# Patient Record
Sex: Female | Born: 1960 | Race: Black or African American | Hispanic: No | Smoking: Never smoker
Health system: Southern US, Community
[De-identification: ages and names within clinical notes are randomized; demographics above are authoritative.]

## PROBLEM LIST (undated history)

## (undated) DIAGNOSIS — M791 Myalgia, unspecified site: Secondary | ICD-10-CM

## (undated) DIAGNOSIS — M47812 Spondylosis without myelopathy or radiculopathy, cervical region: Secondary | ICD-10-CM

## (undated) DIAGNOSIS — I10 Essential (primary) hypertension: Secondary | ICD-10-CM

## (undated) DIAGNOSIS — R202 Paresthesia of skin: Secondary | ICD-10-CM

## (undated) DIAGNOSIS — M51369 Other intervertebral disc degeneration, lumbar region without mention of lumbar back pain or lower extremity pain: Secondary | ICD-10-CM

## (undated) DIAGNOSIS — K429 Umbilical hernia without obstruction or gangrene: Secondary | ICD-10-CM

## (undated) DIAGNOSIS — K648 Other hemorrhoids: Secondary | ICD-10-CM

## (undated) DIAGNOSIS — D649 Anemia, unspecified: Secondary | ICD-10-CM

## (undated) DIAGNOSIS — R200 Anesthesia of skin: Secondary | ICD-10-CM

## (undated) DIAGNOSIS — D259 Leiomyoma of uterus, unspecified: Secondary | ICD-10-CM

## (undated) DIAGNOSIS — F419 Anxiety disorder, unspecified: Secondary | ICD-10-CM

## (undated) DIAGNOSIS — IMO0002 Reserved for concepts with insufficient information to code with codable children: Secondary | ICD-10-CM

## (undated) DIAGNOSIS — K317 Polyp of stomach and duodenum: Secondary | ICD-10-CM

## (undated) DIAGNOSIS — M5136 Other intervertebral disc degeneration, lumbar region: Secondary | ICD-10-CM

## (undated) DIAGNOSIS — R7303 Prediabetes: Secondary | ICD-10-CM

## (undated) DIAGNOSIS — Z90710 Acquired absence of both cervix and uterus: Secondary | ICD-10-CM

## (undated) DIAGNOSIS — Z9289 Personal history of other medical treatment: Secondary | ICD-10-CM

## (undated) DIAGNOSIS — E785 Hyperlipidemia, unspecified: Secondary | ICD-10-CM

## (undated) DIAGNOSIS — K219 Gastro-esophageal reflux disease without esophagitis: Secondary | ICD-10-CM

## (undated) HISTORY — PX: CERVICAL SPINE SURGERY: SHX589

## (undated) HISTORY — DX: Leiomyoma of uterus, unspecified: D25.9

## (undated) HISTORY — DX: Anxiety disorder, unspecified: F41.9

## (undated) HISTORY — DX: Umbilical hernia without obstruction or gangrene: K42.9

## (undated) HISTORY — DX: Personal history of other medical treatment: Z92.89

## (undated) HISTORY — PX: PELVIC LAPAROSCOPY: SHX162

## (undated) HISTORY — PX: HERNIA REPAIR: SHX51

## (undated) HISTORY — DX: Other intervertebral disc degeneration, lumbar region: M51.36

## (undated) HISTORY — DX: Spondylosis without myelopathy or radiculopathy, cervical region: M47.812

## (undated) HISTORY — DX: Gastro-esophageal reflux disease without esophagitis: K21.9

## (undated) HISTORY — DX: Myalgia, unspecified site: M79.10

## (undated) HISTORY — DX: Polyp of stomach and duodenum: K31.7

## (undated) HISTORY — DX: Acquired absence of both cervix and uterus: Z90.710

## (undated) HISTORY — DX: Reserved for concepts with insufficient information to code with codable children: IMO0002

## (undated) HISTORY — DX: Other hemorrhoids: K64.8

## (undated) HISTORY — DX: Other intervertebral disc degeneration, lumbar region without mention of lumbar back pain or lower extremity pain: M51.369

## (undated) HISTORY — DX: Anemia, unspecified: D64.9

## (undated) HISTORY — PX: ABDOMINAL HYSTERECTOMY: SHX81

## (undated) HISTORY — DX: Essential (primary) hypertension: I10

## (undated) HISTORY — DX: Paresthesia of skin: R20.0

## (undated) HISTORY — PX: EXPLORATORY LAPAROTOMY: SUR591

## (undated) HISTORY — DX: Anesthesia of skin: R20.2

## (undated) HISTORY — DX: Prediabetes: R73.03

---

## 1986-06-27 DIAGNOSIS — IMO0002 Reserved for concepts with insufficient information to code with codable children: Secondary | ICD-10-CM

## 1986-06-27 HISTORY — DX: Reserved for concepts with insufficient information to code with codable children: IMO0002

## 1986-06-27 HISTORY — PX: CERVIX LESION DESTRUCTION: SHX591

## 1996-06-27 HISTORY — PX: MYOMECTOMY: SHX85

## 1997-08-26 ENCOUNTER — Inpatient Hospital Stay (HOSPITAL_COMMUNITY): Admission: RE | Admit: 1997-08-26 | Discharge: 1997-08-30 | Payer: Self-pay | Admitting: Obstetrics and Gynecology

## 1997-10-31 ENCOUNTER — Ambulatory Visit (HOSPITAL_COMMUNITY): Admission: RE | Admit: 1997-10-31 | Discharge: 1997-10-31 | Payer: Self-pay | Admitting: Obstetrics and Gynecology

## 1999-04-08 ENCOUNTER — Other Ambulatory Visit: Admission: RE | Admit: 1999-04-08 | Discharge: 1999-04-08 | Payer: Self-pay | Admitting: Obstetrics and Gynecology

## 1999-04-11 ENCOUNTER — Emergency Department (HOSPITAL_COMMUNITY): Admission: EM | Admit: 1999-04-11 | Discharge: 1999-04-11 | Payer: Self-pay | Admitting: Emergency Medicine

## 1999-07-14 ENCOUNTER — Emergency Department (HOSPITAL_COMMUNITY): Admission: EM | Admit: 1999-07-14 | Discharge: 1999-07-15 | Payer: Self-pay

## 1999-07-15 ENCOUNTER — Emergency Department (HOSPITAL_COMMUNITY): Admission: EM | Admit: 1999-07-15 | Discharge: 1999-07-15 | Payer: Self-pay | Admitting: Emergency Medicine

## 1999-07-30 ENCOUNTER — Emergency Department (HOSPITAL_COMMUNITY): Admission: EM | Admit: 1999-07-30 | Discharge: 1999-07-31 | Payer: Self-pay | Admitting: Internal Medicine

## 2000-04-10 ENCOUNTER — Other Ambulatory Visit: Admission: RE | Admit: 2000-04-10 | Discharge: 2000-04-10 | Payer: Self-pay | Admitting: Obstetrics and Gynecology

## 2000-09-03 ENCOUNTER — Encounter: Payer: Self-pay | Admitting: Emergency Medicine

## 2000-09-03 ENCOUNTER — Emergency Department (HOSPITAL_COMMUNITY): Admission: EM | Admit: 2000-09-03 | Discharge: 2000-09-03 | Payer: Self-pay | Admitting: Emergency Medicine

## 2000-09-04 ENCOUNTER — Encounter: Payer: Self-pay | Admitting: Emergency Medicine

## 2001-02-16 ENCOUNTER — Other Ambulatory Visit: Admission: RE | Admit: 2001-02-16 | Discharge: 2001-02-16 | Payer: Self-pay | Admitting: Obstetrics and Gynecology

## 2001-06-27 HISTORY — PX: CHOLECYSTECTOMY: SHX55

## 2002-02-28 ENCOUNTER — Ambulatory Visit (HOSPITAL_COMMUNITY): Admission: RE | Admit: 2002-02-28 | Discharge: 2002-02-28 | Payer: Self-pay | Admitting: Obstetrics and Gynecology

## 2002-02-28 ENCOUNTER — Encounter: Payer: Self-pay | Admitting: Obstetrics and Gynecology

## 2002-03-08 ENCOUNTER — Other Ambulatory Visit: Admission: RE | Admit: 2002-03-08 | Discharge: 2002-03-08 | Payer: Self-pay | Admitting: Obstetrics and Gynecology

## 2002-05-08 ENCOUNTER — Encounter: Admission: RE | Admit: 2002-05-08 | Discharge: 2002-05-08 | Payer: Self-pay

## 2002-05-09 ENCOUNTER — Ambulatory Visit (HOSPITAL_COMMUNITY): Admission: RE | Admit: 2002-05-09 | Discharge: 2002-05-09 | Payer: Self-pay

## 2002-06-17 ENCOUNTER — Encounter: Payer: Self-pay | Admitting: Gastroenterology

## 2002-06-17 ENCOUNTER — Ambulatory Visit (HOSPITAL_COMMUNITY): Admission: RE | Admit: 2002-06-17 | Discharge: 2002-06-17 | Payer: Self-pay | Admitting: Gastroenterology

## 2002-07-10 ENCOUNTER — Emergency Department (HOSPITAL_COMMUNITY): Admission: EM | Admit: 2002-07-10 | Discharge: 2002-07-10 | Payer: Self-pay | Admitting: Emergency Medicine

## 2002-07-22 ENCOUNTER — Encounter: Payer: Self-pay | Admitting: Surgery

## 2002-07-22 ENCOUNTER — Encounter (INDEPENDENT_AMBULATORY_CARE_PROVIDER_SITE_OTHER): Payer: Self-pay | Admitting: Specialist

## 2002-07-22 ENCOUNTER — Ambulatory Visit (HOSPITAL_COMMUNITY): Admission: RE | Admit: 2002-07-22 | Discharge: 2002-07-23 | Payer: Self-pay | Admitting: Surgery

## 2002-08-21 ENCOUNTER — Emergency Department (HOSPITAL_COMMUNITY): Admission: EM | Admit: 2002-08-21 | Discharge: 2002-08-21 | Payer: Self-pay | Admitting: Emergency Medicine

## 2002-08-22 ENCOUNTER — Encounter: Admission: RE | Admit: 2002-08-22 | Discharge: 2002-08-22 | Payer: Self-pay

## 2002-09-30 ENCOUNTER — Emergency Department (HOSPITAL_COMMUNITY): Admission: EM | Admit: 2002-09-30 | Discharge: 2002-09-30 | Payer: Self-pay | Admitting: Emergency Medicine

## 2002-10-01 ENCOUNTER — Emergency Department (HOSPITAL_COMMUNITY): Admission: EM | Admit: 2002-10-01 | Discharge: 2002-10-01 | Payer: Self-pay

## 2002-10-05 ENCOUNTER — Ambulatory Visit (HOSPITAL_COMMUNITY): Admission: EM | Admit: 2002-10-05 | Discharge: 2002-10-05 | Payer: Self-pay | Admitting: Gastroenterology

## 2002-10-05 ENCOUNTER — Encounter: Payer: Self-pay | Admitting: Gastroenterology

## 2003-03-04 ENCOUNTER — Ambulatory Visit (HOSPITAL_COMMUNITY): Admission: RE | Admit: 2003-03-04 | Discharge: 2003-03-04 | Payer: Self-pay | Admitting: Obstetrics and Gynecology

## 2003-03-04 ENCOUNTER — Encounter: Payer: Self-pay | Admitting: Obstetrics and Gynecology

## 2003-03-10 ENCOUNTER — Other Ambulatory Visit: Admission: RE | Admit: 2003-03-10 | Discharge: 2003-03-10 | Payer: Self-pay | Admitting: Obstetrics and Gynecology

## 2003-03-26 ENCOUNTER — Ambulatory Visit (HOSPITAL_COMMUNITY): Admission: RE | Admit: 2003-03-26 | Discharge: 2003-03-26 | Payer: Self-pay | Admitting: Gastroenterology

## 2003-05-05 ENCOUNTER — Encounter: Admission: RE | Admit: 2003-05-05 | Discharge: 2003-05-05 | Payer: Self-pay | Admitting: Gastroenterology

## 2003-05-20 ENCOUNTER — Encounter: Admission: RE | Admit: 2003-05-20 | Discharge: 2003-05-20 | Payer: Self-pay | Admitting: Family Medicine

## 2003-11-21 ENCOUNTER — Encounter: Admission: RE | Admit: 2003-11-21 | Discharge: 2003-11-21 | Payer: Self-pay | Admitting: Family Medicine

## 2004-03-19 ENCOUNTER — Encounter: Admission: RE | Admit: 2004-03-19 | Discharge: 2004-03-19 | Payer: Self-pay | Admitting: Orthopedic Surgery

## 2004-03-22 ENCOUNTER — Ambulatory Visit (HOSPITAL_COMMUNITY): Admission: RE | Admit: 2004-03-22 | Discharge: 2004-03-22 | Payer: Self-pay | Admitting: Obstetrics and Gynecology

## 2004-05-11 ENCOUNTER — Other Ambulatory Visit: Admission: RE | Admit: 2004-05-11 | Discharge: 2004-05-11 | Payer: Self-pay | Admitting: Obstetrics and Gynecology

## 2004-08-11 ENCOUNTER — Ambulatory Visit (HOSPITAL_COMMUNITY): Admission: RE | Admit: 2004-08-11 | Discharge: 2004-08-11 | Payer: Self-pay | Admitting: Neurology

## 2004-08-18 ENCOUNTER — Emergency Department (HOSPITAL_COMMUNITY): Admission: EM | Admit: 2004-08-18 | Discharge: 2004-08-18 | Payer: Self-pay | Admitting: Emergency Medicine

## 2004-08-20 ENCOUNTER — Encounter: Admission: RE | Admit: 2004-08-20 | Discharge: 2004-08-20 | Payer: Self-pay | Admitting: Neurology

## 2004-10-14 ENCOUNTER — Other Ambulatory Visit: Admission: RE | Admit: 2004-10-14 | Discharge: 2004-10-14 | Payer: Self-pay | Admitting: Obstetrics and Gynecology

## 2005-03-23 ENCOUNTER — Emergency Department (HOSPITAL_COMMUNITY): Admission: EM | Admit: 2005-03-23 | Discharge: 2005-03-23 | Payer: Self-pay | Admitting: Emergency Medicine

## 2005-05-12 ENCOUNTER — Other Ambulatory Visit: Admission: RE | Admit: 2005-05-12 | Discharge: 2005-05-12 | Payer: Self-pay | Admitting: Obstetrics and Gynecology

## 2005-08-10 ENCOUNTER — Encounter: Admission: RE | Admit: 2005-08-10 | Discharge: 2005-08-10 | Payer: Self-pay | Admitting: Neurology

## 2005-08-17 ENCOUNTER — Encounter: Admission: RE | Admit: 2005-08-17 | Discharge: 2005-08-17 | Payer: Self-pay | Admitting: Family Medicine

## 2006-04-12 ENCOUNTER — Ambulatory Visit: Payer: Self-pay | Admitting: Hematology and Oncology

## 2006-05-03 LAB — CBC WITH DIFFERENTIAL/PLATELET
BASO%: 0.2 % (ref 0.0–2.0)
Basophils Absolute: 0 10*3/uL (ref 0.0–0.1)
EOS%: 1.5 % (ref 0.0–7.0)
Eosinophils Absolute: 0.1 10*3/uL (ref 0.0–0.5)
HCT: 35.7 % (ref 34.8–46.6)
HGB: 11.9 g/dL (ref 11.6–15.9)
LYMPH%: 26.4 % (ref 14.0–48.0)
MCH: 30.7 pg (ref 26.0–34.0)
MCHC: 33.4 g/dL (ref 32.0–36.0)
MCV: 91.8 fL (ref 81.0–101.0)
MONO#: 0.5 10*3/uL (ref 0.1–0.9)
MONO%: 7.8 % (ref 0.0–13.0)
NEUT#: 4.2 10*3/uL (ref 1.5–6.5)
NEUT%: 64.1 % (ref 39.6–76.8)
Platelets: 196 10*3/uL (ref 145–400)
RBC: 3.88 10*6/uL (ref 3.70–5.32)
RDW: 14.6 % — ABNORMAL HIGH (ref 11.3–14.5)
WBC: 6.5 10*3/uL (ref 3.9–10.0)
lymph#: 1.7 10*3/uL (ref 0.9–3.3)

## 2006-05-03 LAB — IVY BLEEDING TIME: Bleeding Time: 6 Minutes (ref 2.0–8.0)

## 2006-05-10 LAB — VON WILLEBRAND FACTOR MULTIMER
Factor-VIII Activity: 136 % (ref 75–150)
Ristocetin-Cofactor: >150 % — ABNORMAL HIGH (ref 50–150)
Von Willebrand Ag: 217 % normal — ABNORMAL HIGH (ref 61–164)
Von Willebrand Multimers: NORMAL

## 2006-05-10 LAB — COMPREHENSIVE METABOLIC PANEL
ALT: 13 U/L (ref 0–35)
AST: 17 U/L (ref 0–37)
Albumin: 4.1 g/dL (ref 3.5–5.2)
Alkaline Phosphatase: 41 U/L (ref 39–117)
BUN: 10 mg/dL (ref 6–23)
CO2: 26 mEq/L (ref 19–32)
Calcium: 8.8 mg/dL (ref 8.4–10.5)
Chloride: 106 mEq/L (ref 96–112)
Creatinine, Ser: 0.85 mg/dL (ref 0.40–1.20)
Glucose, Bld: 84 mg/dL (ref 70–99)
Potassium: 3.6 mEq/L (ref 3.5–5.3)
Sodium: 141 mEq/L (ref 135–145)
Total Bilirubin: 0.5 mg/dL (ref 0.3–1.2)
Total Protein: 6.8 g/dL (ref 6.0–8.3)

## 2006-05-10 LAB — LUPUS ANTICOAGULANT PANEL
DRVVT: 39.9 secs (ref 31.9–44.2)
PTT Lupus Anticoagulant: 50 secs — ABNORMAL HIGH (ref 36.3–48.8)
PTTLA 4:1 Mix: 45.4 secs (ref 36.3–48.8)

## 2006-05-10 LAB — FACTOR 11: Factor XI Activity: 101 % (ref 56–153)

## 2006-05-10 LAB — FIBRINOGEN: Fibrinogen: 338 mg/dL (ref 204–475)

## 2006-05-10 LAB — D-DIMER, QUANTITATIVE (NOT AT ARMC): D-Dimer, Quant: 0.27 ug/mL-FEU (ref 0.00–0.48)

## 2006-05-10 LAB — PROTHROMBIN TIME
INR: 1.1 (ref 0.0–1.5)
Prothrombin Time: 14.7 seconds (ref 11.6–15.2)

## 2006-05-10 LAB — PROTEIN S, TOTAL: Protein S Ag, Total: 123 % (ref 63–126)

## 2006-05-10 LAB — APTT: aPTT: 34 seconds (ref 24–37)

## 2006-05-10 LAB — PROTEIN S ACTIVITY: Protein S Activity: 72 % — ABNORMAL LOW (ref 81–180)

## 2006-05-17 ENCOUNTER — Ambulatory Visit (HOSPITAL_COMMUNITY): Admission: RE | Admit: 2006-05-17 | Discharge: 2006-05-17 | Payer: Self-pay | Admitting: Obstetrics and Gynecology

## 2006-06-13 ENCOUNTER — Other Ambulatory Visit: Admission: RE | Admit: 2006-06-13 | Discharge: 2006-06-13 | Payer: Self-pay | Admitting: Obstetrics & Gynecology

## 2006-09-11 ENCOUNTER — Ambulatory Visit: Payer: Self-pay | Admitting: Cardiology

## 2006-09-24 ENCOUNTER — Emergency Department (HOSPITAL_COMMUNITY): Admission: EM | Admit: 2006-09-24 | Discharge: 2006-09-24 | Payer: Self-pay | Admitting: Emergency Medicine

## 2006-12-31 ENCOUNTER — Emergency Department (HOSPITAL_COMMUNITY): Admission: EM | Admit: 2006-12-31 | Discharge: 2006-12-31 | Payer: Self-pay | Admitting: Emergency Medicine

## 2007-04-23 ENCOUNTER — Emergency Department (HOSPITAL_COMMUNITY): Admission: EM | Admit: 2007-04-23 | Discharge: 2007-04-23 | Payer: Self-pay | Admitting: Emergency Medicine

## 2007-05-21 ENCOUNTER — Ambulatory Visit (HOSPITAL_COMMUNITY): Admission: RE | Admit: 2007-05-21 | Discharge: 2007-05-21 | Payer: Self-pay | Admitting: Obstetrics and Gynecology

## 2007-06-15 ENCOUNTER — Other Ambulatory Visit: Admission: RE | Admit: 2007-06-15 | Discharge: 2007-06-15 | Payer: Self-pay | Admitting: Obstetrics & Gynecology

## 2007-08-25 LAB — HM PAP SMEAR: HM Pap smear: NORMAL

## 2007-09-01 ENCOUNTER — Emergency Department (HOSPITAL_COMMUNITY): Admission: EM | Admit: 2007-09-01 | Discharge: 2007-09-02 | Payer: Self-pay | Admitting: Emergency Medicine

## 2007-11-06 IMAGING — CR DG NECK SOFT TISSUE
1 series · 1 of 1 positions shown · non-contrast
Comparison: none

CLINICAL DATA: Foreign object in throat.  Plastic candy wrapper stuck in throat.  
 NECK SOFT TISSUE ? 1 VIEW:

[view not recorded]
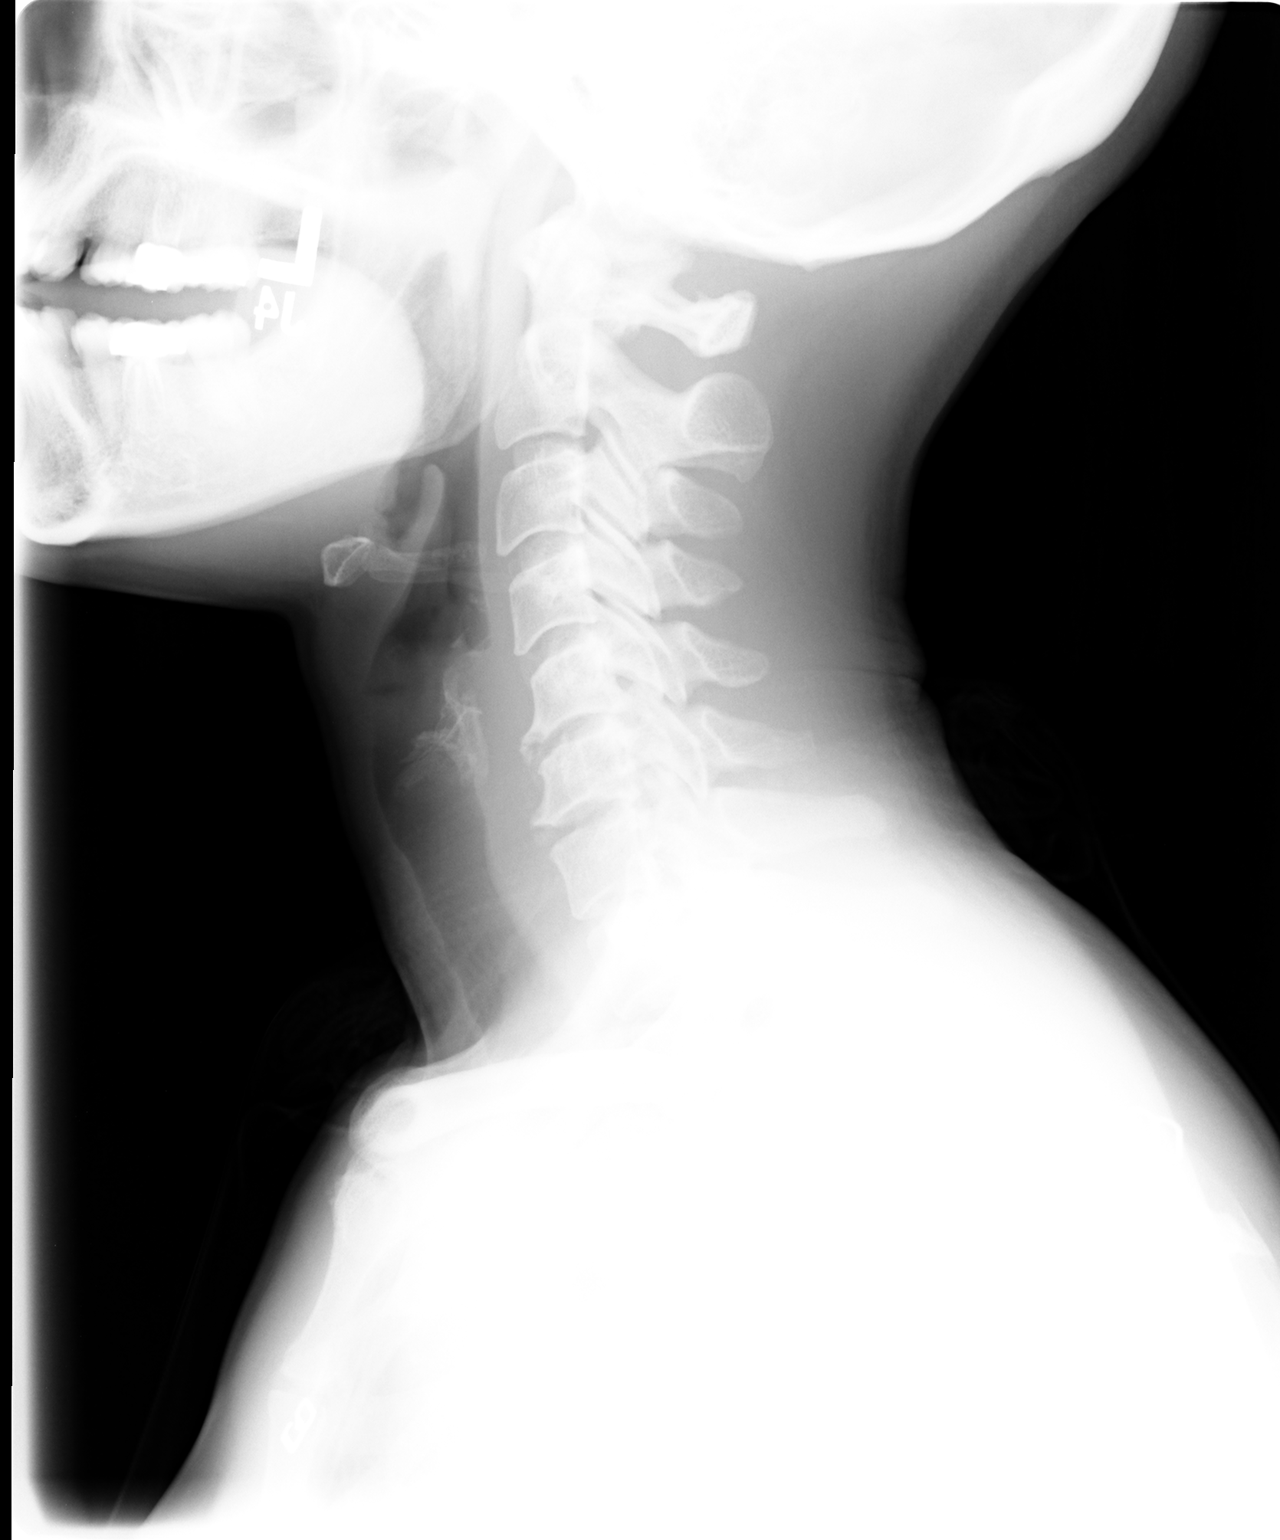

[1 of 1 positions shown; findings below may reference images not displayed]

FINDINGS: Prevertebral soft tissues are within normal limits.  No definite radiopaque foreign body.  Degenerative changes are seen in the spine.
IMPRESSION: 1.  No definite radiopaque foreign body.  
 2.  Spondylosis.

## 2007-11-15 ENCOUNTER — Emergency Department (HOSPITAL_COMMUNITY): Admission: EM | Admit: 2007-11-15 | Discharge: 2007-11-15 | Payer: Self-pay | Admitting: Family Medicine

## 2007-12-26 DIAGNOSIS — Z9289 Personal history of other medical treatment: Secondary | ICD-10-CM

## 2007-12-26 HISTORY — DX: Personal history of other medical treatment: Z92.89

## 2007-12-27 ENCOUNTER — Ambulatory Visit (HOSPITAL_COMMUNITY): Admission: RE | Admit: 2007-12-27 | Discharge: 2007-12-27 | Payer: Self-pay | Admitting: Family Medicine

## 2008-05-15 ENCOUNTER — Emergency Department (HOSPITAL_COMMUNITY): Admission: EM | Admit: 2008-05-15 | Discharge: 2008-05-15 | Payer: Self-pay | Admitting: Emergency Medicine

## 2008-06-12 ENCOUNTER — Ambulatory Visit (HOSPITAL_COMMUNITY): Admission: RE | Admit: 2008-06-12 | Discharge: 2008-06-12 | Payer: Self-pay | Admitting: Obstetrics and Gynecology

## 2008-06-19 ENCOUNTER — Encounter: Admission: RE | Admit: 2008-06-19 | Discharge: 2008-06-19 | Payer: Self-pay | Admitting: Family Medicine

## 2008-06-22 ENCOUNTER — Encounter: Admission: RE | Admit: 2008-06-22 | Discharge: 2008-06-22 | Payer: Self-pay | Admitting: Family Medicine

## 2008-06-27 HISTORY — PX: CERVICAL FUSION: SHX112

## 2008-08-11 ENCOUNTER — Other Ambulatory Visit: Admission: RE | Admit: 2008-08-11 | Discharge: 2008-08-11 | Payer: Self-pay | Admitting: Obstetrics and Gynecology

## 2008-09-14 ENCOUNTER — Emergency Department (HOSPITAL_COMMUNITY): Admission: EM | Admit: 2008-09-14 | Discharge: 2008-09-14 | Payer: Self-pay | Admitting: Emergency Medicine

## 2008-09-19 ENCOUNTER — Encounter: Admission: RE | Admit: 2008-09-19 | Discharge: 2008-09-19 | Payer: Self-pay | Admitting: Emergency Medicine

## 2008-11-24 ENCOUNTER — Emergency Department (HOSPITAL_COMMUNITY): Admission: EM | Admit: 2008-11-24 | Discharge: 2008-11-24 | Payer: Self-pay | Admitting: Family Medicine

## 2009-06-15 ENCOUNTER — Ambulatory Visit (HOSPITAL_COMMUNITY): Admission: RE | Admit: 2009-06-15 | Discharge: 2009-06-15 | Payer: Self-pay | Admitting: Obstetrics and Gynecology

## 2009-06-23 ENCOUNTER — Encounter: Admission: RE | Admit: 2009-06-23 | Discharge: 2009-06-23 | Payer: Self-pay | Admitting: Obstetrics and Gynecology

## 2009-09-17 ENCOUNTER — Encounter: Admission: RE | Admit: 2009-09-17 | Discharge: 2009-09-17 | Payer: Self-pay | Admitting: Orthopedic Surgery

## 2009-10-12 ENCOUNTER — Ambulatory Visit (HOSPITAL_COMMUNITY): Admission: RE | Admit: 2009-10-12 | Discharge: 2009-10-13 | Payer: Self-pay | Admitting: Neurosurgery

## 2010-03-27 DIAGNOSIS — I10 Essential (primary) hypertension: Secondary | ICD-10-CM

## 2010-03-27 DIAGNOSIS — Z9289 Personal history of other medical treatment: Secondary | ICD-10-CM

## 2010-03-27 HISTORY — DX: Essential (primary) hypertension: I10

## 2010-03-27 HISTORY — DX: Personal history of other medical treatment: Z92.89

## 2010-04-07 ENCOUNTER — Observation Stay (HOSPITAL_COMMUNITY): Admission: EM | Admit: 2010-04-07 | Discharge: 2010-04-07 | Payer: Self-pay | Admitting: Emergency Medicine

## 2010-04-07 ENCOUNTER — Ambulatory Visit: Payer: Self-pay | Admitting: Cardiology

## 2010-04-07 ENCOUNTER — Encounter (INDEPENDENT_AMBULATORY_CARE_PROVIDER_SITE_OTHER): Payer: Self-pay | Admitting: Internal Medicine

## 2010-06-02 ENCOUNTER — Ambulatory Visit: Payer: Self-pay | Admitting: Cardiovascular Disease

## 2010-06-16 ENCOUNTER — Ambulatory Visit (HOSPITAL_COMMUNITY): Admission: RE | Admit: 2010-06-16 | Payer: Self-pay | Source: Home / Self Care | Admitting: Obstetrics and Gynecology

## 2010-07-17 ENCOUNTER — Encounter: Payer: Self-pay | Admitting: Obstetrics and Gynecology

## 2010-07-18 ENCOUNTER — Encounter: Payer: Self-pay | Admitting: Obstetrics and Gynecology

## 2010-07-23 ENCOUNTER — Other Ambulatory Visit: Payer: Self-pay | Admitting: Obstetrics and Gynecology

## 2010-07-23 DIAGNOSIS — Z1239 Encounter for other screening for malignant neoplasm of breast: Secondary | ICD-10-CM

## 2010-07-28 ENCOUNTER — Ambulatory Visit (HOSPITAL_COMMUNITY): Payer: Self-pay

## 2010-08-02 ENCOUNTER — Ambulatory Visit (HOSPITAL_COMMUNITY)
Admission: RE | Admit: 2010-08-02 | Discharge: 2010-08-02 | Disposition: A | Discharge status: Home / Self Care | Payer: BC Managed Care – PPO | Source: Ambulatory Visit | Attending: Obstetrics and Gynecology | Admitting: Obstetrics and Gynecology

## 2010-08-02 DIAGNOSIS — Z1231 Encounter for screening mammogram for malignant neoplasm of breast: Secondary | ICD-10-CM | POA: Insufficient documentation

## 2010-08-02 DIAGNOSIS — Z1239 Encounter for other screening for malignant neoplasm of breast: Secondary | ICD-10-CM

## 2010-08-26 HISTORY — PX: ESOPHAGOGASTRODUODENOSCOPY: SHX1529

## 2010-08-26 HISTORY — PX: COLONOSCOPY: SHX174

## 2010-09-09 LAB — POCT CARDIAC MARKERS
CKMB, poc: <1 ng/mL — ABNORMAL LOW (ref 1.0–8.0)
Myoglobin, poc: 30.5 ng/mL (ref 12–200)
Troponin i, poc: <0.05 ng/mL (ref 0.00–0.09)

## 2010-09-09 LAB — CBC
HCT: 30.1 % — ABNORMAL LOW (ref 36.0–46.0)
HCT: 32.8 % — ABNORMAL LOW (ref 36.0–46.0)
Hemoglobin: 10.3 g/dL — ABNORMAL LOW (ref 12.0–15.0)
Hemoglobin: 11.1 g/dL — ABNORMAL LOW (ref 12.0–15.0)
MCH: 30.5 pg (ref 26.0–34.0)
MCH: 30.9 pg (ref 26.0–34.0)
MCHC: 33.8 g/dL (ref 30.0–36.0)
MCHC: 34.2 g/dL (ref 30.0–36.0)
MCV: 90.4 fL (ref 78.0–100.0)
MCV: 90.4 fL (ref 78.0–100.0)
Platelets: 158 10*3/uL (ref 150–400)
Platelets: 175 10*3/uL (ref 150–400)
RBC: 3.33 MIL/uL — ABNORMAL LOW (ref 3.87–5.11)
RBC: 3.63 MIL/uL — ABNORMAL LOW (ref 3.87–5.11)
RDW: 14.1 % (ref 11.5–15.5)
RDW: 14.8 % (ref 11.5–15.5)
WBC: 4.6 10*3/uL (ref 4.0–10.5)
WBC: 5.2 10*3/uL (ref 4.0–10.5)

## 2010-09-09 LAB — CARDIAC PANEL(CRET KIN+CKTOT+MB+TROPI)
CK, MB: 0.6 ng/mL (ref 0.3–4.0)
CK, MB: 0.6 ng/mL (ref 0.3–4.0)
CK, MB: 0.7 ng/mL (ref 0.3–4.0)
Relative Index: 0.4 (ref 0.0–2.5)
Relative Index: 0.6 (ref 0.0–2.5)
Relative Index: 0.7 (ref 0.0–2.5)
Total CK: 104 U/L (ref 7–177)
Total CK: 109 U/L (ref 7–177)
Total CK: 147 U/L (ref 7–177)
Troponin I: 0.02 ng/mL (ref 0.00–0.06)
Troponin I: 0.02 ng/mL (ref 0.00–0.06)
Troponin I: 0.03 ng/mL (ref 0.00–0.06)

## 2010-09-09 LAB — BASIC METABOLIC PANEL
BUN: 10 mg/dL (ref 6–23)
BUN: 11 mg/dL (ref 6–23)
CO2: 26 mEq/L (ref 19–32)
CO2: 26 mEq/L (ref 19–32)
Calcium: 8.7 mg/dL (ref 8.4–10.5)
Calcium: 9.3 mg/dL (ref 8.4–10.5)
Chloride: 106 mEq/L (ref 96–112)
Chloride: 108 mEq/L (ref 96–112)
Creatinine, Ser: 0.75 mg/dL (ref 0.4–1.2)
Creatinine, Ser: 0.87 mg/dL (ref 0.4–1.2)
GFR calc Af Amer: >60 mL/min (ref >60)
GFR calc Af Amer: >60 mL/min (ref >60)
GFR calc non Af Amer: >60 mL/min (ref >60)
GFR calc non Af Amer: >60 mL/min (ref >60)
Glucose, Bld: 102 mg/dL — ABNORMAL HIGH (ref 70–99)
Glucose, Bld: 104 mg/dL — ABNORMAL HIGH (ref 70–99)
Potassium: 3.2 mEq/L — ABNORMAL LOW (ref 3.5–5.1)
Potassium: 3.7 mEq/L (ref 3.5–5.1)
Sodium: 138 mEq/L (ref 135–145)
Sodium: 138 mEq/L (ref 135–145)

## 2010-09-09 LAB — DIFFERENTIAL
Basophils Absolute: 0 10*3/uL (ref 0.0–0.1)
Basophils Relative: 1 % (ref 0–1)
Eosinophils Absolute: 0.3 10*3/uL (ref 0.0–0.7)
Eosinophils Relative: 5 % (ref 0–5)
Lymphocytes Relative: 40 % (ref 12–46)
Lymphs Abs: 2.1 10*3/uL (ref 0.7–4.0)
Monocytes Absolute: 0.5 10*3/uL (ref 0.1–1.0)
Monocytes Relative: 10 % (ref 3–12)
Neutro Abs: 2.3 10*3/uL (ref 1.7–7.7)
Neutrophils Relative %: 44 % (ref 43–77)

## 2010-09-15 LAB — BASIC METABOLIC PANEL
BUN: 10 mg/dL (ref 6–23)
CO2: 25 mEq/L (ref 19–32)
Calcium: 9 mg/dL (ref 8.4–10.5)
Chloride: 106 mEq/L (ref 96–112)
Creatinine, Ser: 0.7 mg/dL (ref 0.4–1.2)
GFR calc Af Amer: >60 mL/min (ref >60)
GFR calc non Af Amer: >60 mL/min (ref >60)
Glucose, Bld: 94 mg/dL (ref 70–99)
Potassium: 4 mEq/L (ref 3.5–5.1)
Sodium: 137 mEq/L (ref 135–145)

## 2010-09-15 LAB — CBC
HCT: 37.1 % (ref 36.0–46.0)
Hemoglobin: 12.9 g/dL (ref 12.0–15.0)
MCHC: 34.7 g/dL (ref 30.0–36.0)
MCV: 93.4 fL (ref 78.0–100.0)
Platelets: 174 10*3/uL (ref 150–400)
RBC: 3.97 MIL/uL (ref 3.87–5.11)
RDW: 15 % (ref 11.5–15.5)
WBC: 5.1 10*3/uL (ref 4.0–10.5)

## 2010-09-15 LAB — URINALYSIS, ROUTINE W REFLEX MICROSCOPIC
Bilirubin Urine: NEGATIVE
Glucose, UA: NEGATIVE mg/dL
Hgb urine dipstick: NEGATIVE
Ketones, ur: NEGATIVE mg/dL
Nitrite: NEGATIVE
Protein, ur: NEGATIVE mg/dL
Specific Gravity, Urine: 1.027 (ref 1.005–1.030)
Urobilinogen, UA: 1 mg/dL (ref 0.0–1.0)
pH: 7 (ref 5.0–8.0)

## 2010-09-15 LAB — PROTIME-INR
INR: 1.08 (ref 0.00–1.49)
Prothrombin Time: 13.9 seconds (ref 11.6–15.2)

## 2010-09-15 LAB — HCG, SERUM, QUALITATIVE: Preg, Serum: NEGATIVE

## 2010-09-15 LAB — SURGICAL PCR SCREEN
MRSA, PCR: NEGATIVE
Staphylococcus aureus: NEGATIVE

## 2010-09-15 LAB — APTT: aPTT: 31 seconds (ref 24–37)

## 2010-10-05 LAB — POCT URINALYSIS DIP (DEVICE)
Bilirubin Urine: NEGATIVE
Glucose, UA: NEGATIVE mg/dL
Nitrite: NEGATIVE
Protein, ur: 30 mg/dL — AB
Specific Gravity, Urine: 1.02 (ref 1.005–1.030)
Urobilinogen, UA: 0.2 mg/dL (ref 0.0–1.0)
pH: 7 (ref 5.0–8.0)

## 2010-10-05 LAB — URINE CULTURE: Colony Count: >=100000

## 2010-10-05 LAB — POCT PREGNANCY, URINE: Preg Test, Ur: NEGATIVE

## 2010-10-22 ENCOUNTER — Institutional Professional Consult (permissible substitution) (INDEPENDENT_AMBULATORY_CARE_PROVIDER_SITE_OTHER): Payer: BC Managed Care – PPO | Admitting: *Deleted

## 2010-10-22 DIAGNOSIS — E785 Hyperlipidemia, unspecified: Secondary | ICD-10-CM

## 2010-10-22 DIAGNOSIS — R634 Abnormal weight loss: Secondary | ICD-10-CM

## 2010-10-22 DIAGNOSIS — I10 Essential (primary) hypertension: Secondary | ICD-10-CM

## 2010-10-22 DIAGNOSIS — R7301 Impaired fasting glucose: Secondary | ICD-10-CM

## 2010-10-26 ENCOUNTER — Other Ambulatory Visit: Payer: BC Managed Care – PPO

## 2010-11-01 ENCOUNTER — Other Ambulatory Visit: Payer: BC Managed Care – PPO

## 2010-11-02 ENCOUNTER — Telehealth: Payer: Self-pay | Admitting: *Deleted

## 2010-11-02 NOTE — Telephone Encounter (Addendum)
Message copied by Ellsworth Lennox on Tue Nov 02, 2010  2:51 PM ------      Message from: Baden, DAVID      Created: Tue Nov 02, 2010  2:05 PM       1) cholesterol is elevated.  She is taking Red Yeast Rice, but I'd recommend starting a cholesterol medication to lower chol and reduce risk of stroke/heart dz.  If agreeable, I'll send script.      2) her screen for diabetes is 6.0%, about the same as prior, no worse.      3) her marker for inflammation (sed rate) is elevated, presumably due to her gluten issue that was recently diagnosed.   Did she get the gluten diet info we mailed to her?      4) she is anemic, but it appears stable.  Otherwise, her labs are ok.      5) recheck in 53mo on weight loss, anemia, sooner prn or if losing >5 lb/wk.  Pt notified of lab results.  Pt will start a cholesterol medication. Pt did get gluten diet info we mailed to her and will call to schedule an appointment in one month.  CM, LPN

## 2010-11-03 ENCOUNTER — Telehealth: Payer: Self-pay | Admitting: *Deleted

## 2010-11-03 ENCOUNTER — Encounter: Payer: Self-pay | Admitting: Medical

## 2010-11-03 ENCOUNTER — Other Ambulatory Visit: Payer: Self-pay | Admitting: Medical

## 2010-11-03 MED ORDER — PRAVASTATIN SODIUM 40 MG PO TABS: 40.0000 mg | ORAL_TABLET | Freq: Every evening | ORAL | Status: DC

## 2010-11-03 NOTE — Telephone Encounter (Addendum)
Patient has agreed to start Cholesterol medication.  Will schedule an appointment to come back if pt loses more than 5 lbs.  CM, LPN     Called in Prevachol 40 mg one by mouth every evening #30 with 5 refills to [facility] [address]  CM, LPN

## 2010-11-03 NOTE — Telephone Encounter (Signed)
Error

## 2010-11-03 NOTE — Telephone Encounter (Signed)
pls call in pravastatin script as no pharmacy listed

## 2010-11-04 ENCOUNTER — Encounter: Payer: Self-pay | Admitting: Medical

## 2010-11-12 NOTE — Op Note (Signed)
NAMEBAYLA, MCGOVERN                            ACCOUNT NO.:  [id number]   MEDICAL RECORD NO.:  [id number]                    PATIENT TYPE:   LOCATION:                                       FACILITY:   PHYSICIAN:  Abigail Miyamoto, M.D.              DATE OF BIRTH:  1960-08-18   DATE OF PROCEDURE:  07/22/2002  DATE OF DISCHARGE:                                 OPERATIVE REPORT   PREOPERATIVE DIAGNOSIS:  Biliary dyskinesia.   POSTOPERATIVE DIAGNOSIS:  Biliary dyskinesia.   PROCEDURE:  Laparoscopic cholecystectomy with intraoperative cholangiogram.   SURGEON:  Dr. Abigail Miyamoto.   ASSISTANT:  Dr. Glenna Fellows.   ANESTHESIA:  General endotracheal anesthesia.   ESTIMATED BLOOD LOSS:  Minimal.   FINDINGS:  Patient found to have a normal cholangiogram.   PROCEDURE IN DETAIL:  The patient is brought to the operating room,  identified as Shari Lawrence.  She was placed on the operating room table and  general anesthesia was induced.  Her abdomen was then prepped and draped in  the usual sterile fashion.  Using a 15 blade, a small transverse incision  was made below the umbilicus.  The incision was carried down through the  fascia and opened with scalpel.  Hemostat was then used to pass through the  peritoneal cavity.  Next, 0 Vicryl purse string sutures was placed around  the fascial opening.  The Hasson port was placed through the opening and  insufflation of the abdomen was begun.  Next, an 11 mm port was placed in  the patient's epigastrium and two 5 mm ports were placed in the patient's  right flank under direct vision.  The gallbladder was then grasped on the  triangle above the liver bed.  The cystic duct was easily dissected out and  clipped once distally and partly transected with scissors.  An angio  catheter was inserted in the right upper quadrant under direct vision.  A  cholangiocatheter was then inserted through this and placed into the cystic  duct.  A  cholangiogram was then performed under direct fluoroscopy.  Good  flow of contrast was seen through the entire biliary system as well as the  duodenum without evidence of abnormality.  At this point, the  cholangiocatheter was removed under direct vision.  The cystic duct was then  clipped three times proximally and then transected with scissors.  The  cystic artery and branch were then identified and clipped proximally and  distally and transected with scissors.  The gallbladder was then easily  dissected free from the liver bed with electrocautery.  Hemostasis was  achieved in the liver bed with electrocautery.  The gallbladder was then  removed through the incision of the umbilicus.  0 Vicryl in the umbilicus  was tied in place, closing the fascial defect.  The abdomen was then  copiously irrigated with normal saline.  Again, hemostasis appeared  to be  achieved.  All ports were then removed under direction vision and the  abdomen was deflated.  All incisions were anesthetized with 0.25% Marcaine  and then  closed with 4-0 Monocryl subcuticular sutures.  Steri-Strips followed by  Tegaderm tape were then applied.  The patient tolerated the procedure well.  All sponge, needle and instrument counts were correct at the end of the  procedure.  The patient was then extubated in the operating room and taken  in stable condition to the recovery room.                                               Abigail Miyamoto, M.D.    DB/MEDQ  D:  07/22/2002  T:  07/22/2002  Job:  [id number]

## 2010-11-12 NOTE — Op Note (Signed)
NAMEKHAMARI, Shari                          ACCOUNT NO.:  [id number]   MEDICAL RECORD NO.:  [id number]                   PATIENT TYPE:  AMB   LOCATION:  ENDO                                 FACILITY:  [facility]   PHYSICIAN:  Anselmo Rod, M.D.               DATE OF BIRTH:  04/15/1961   DATE OF PROCEDURE:  03/26/2003  DATE OF DISCHARGE:                                 OPERATIVE REPORT   PROCEDURE:  Colonoscopy.   ENDOSCOPIST:  Charna Elizabeth, M.D.   INSTRUMENT USED:  Olympus video colonoscope (adjustable pediatric scope  changed to an adult scope).   INDICATIONS FOR PROCEDURE:  Rectal bleeding with a history of abdominal pain  in a 50 year old [place] American female with a family history of IBS, rule  out IBD.   PREPROCEDURE PREPARATION:  Informed consent was obtained from the patient.  The patient was fasted for eight hours prior to the procedure and prepped  with a bottle of magnesium citrate and a gallon of GoLYTELY the night prior  to the procedure.   PREPROCEDURE PHYSICAL:  Patient with stable vital signs.  Neck supple.  Chest clear to auscultation.  S1 and S2 regular.  Abdomen soft with normal  bowel sounds.   DESCRIPTION OF PROCEDURE:  The patient was placed in the left lateral  decubitus position, sedated with 100 mg of Demerol and 10 mg Versed in slow  incremental doses.  Once the patient was adequately sedated, maintained on  low flow oxygen, continuous cardiac monitoring, the Olympus video  colonoscope was advanced from the rectum to the mid transverse colon with  difficulty, the pediatric adjustable scope was used initially, this was then  withdrawn after the scope could no longer be advanced into the ascending  colon after changing the patient's position from the left lateral to the  supine to the right lateral position with gentle application of abdominal  pressure.  The adjustable pediatric scope was then withdrawn and the adult  scope used instead.  Small  internal hemorrhoids were seen on retroflexion.  The patient had a very tortuous colon and experienced some discomfort with  insufflation of air into the colon indicating visceral hypersensitivity most  consistent with IBS.  The terminal ileum appeared normal without lesions.  There was no evidence of IBD.   IMPRESSION:  1. Essentially normal colonoscopy to the terminal ileum except for small     internal hemorrhoids.  2. Very tortuous colon probably secondary to abdominal surgeries in the     past.    RECOMMENDATIONS:  1. Continue high fiber diet with liberal fluid intake.  2. Repeat CRC screening in the next ten years unless the patient develops     any abnormal symptoms in the interim.  3. Outpatient follow up in the next two weeks or earlier if need be.  Anselmo Rod, M.D.    JNM/MEDQ  D:  03/27/2003  T:  03/27/2003  Job:  [id number]   cc:   Christella Noa, M.D.  [address]  [place], [place] [address]  Fax: [phone]

## 2010-11-12 NOTE — Op Note (Signed)
[name], [name] NO.:  [id number]   MEDICAL RECORD NO.:  [id number]          PATIENT TYPE:  OUT   LOCATION:  MDC                          FACILITY:  [facility]   PHYSICIAN:  Genene Churn. Love, M.D.    DATE OF BIRTH:  25-Oct-1960   DATE OF PROCEDURE:  08/11/2004  DATE OF DISCHARGE:                                 OPERATIVE REPORT   CLINICAL INFORMATION:  This patient has a history of recurrent paresthesias.   PROCEDURE NOTE:  The patient was prepped and draped in the left lateral  decubitus position using Betadine and 1% Xylocaine.  Xanax 0.5 mg was given  prior to the procedure because of anxiety.  The opening pressure was 170  mmH20 and clear colorless CSF was obtained and sent for studies.  The  patient tolerated the procedure well.      JML/MEDQ  D:  08/11/2004  T:  08/11/2004  Job:  [id number]

## 2010-11-24 ENCOUNTER — Ambulatory Visit: Payer: BC Managed Care – PPO | Admitting: Medical

## 2010-11-26 DIAGNOSIS — D649 Anemia, unspecified: Secondary | ICD-10-CM

## 2010-11-26 HISTORY — DX: Anemia, unspecified: D64.9

## 2011-03-21 LAB — HEPATIC FUNCTION PANEL
ALT: 39 — ABNORMAL HIGH
AST: 50 — ABNORMAL HIGH
Albumin: 4
Alkaline Phosphatase: 64
Bilirubin, Direct: <0.1
Total Bilirubin: 0.9
Total Protein: 7.2

## 2011-03-21 LAB — CBC
HCT: 39
Hemoglobin: 13
MCHC: 33.4
MCV: 89.5
Platelets: 205
RBC: 4.35
RDW: 14.8
WBC: 7.5

## 2011-03-21 LAB — I-STAT 8, (EC8 V) (CONVERTED LAB)
Acid-base deficit: 2
BUN: 13
Bicarbonate: 22.8
Chloride: 108
Glucose, Bld: 114 — ABNORMAL HIGH
HCT: 44
Hemoglobin: 15
Operator id: 294511
Potassium: 3.4 — ABNORMAL LOW
Sodium: 140
TCO2: 24
pCO2, Ven: 37 — ABNORMAL LOW
pH, Ven: 7.398 — ABNORMAL HIGH

## 2011-03-21 LAB — DIFFERENTIAL
Basophils Absolute: 0
Basophils Relative: 0
Eosinophils Absolute: 0
Eosinophils Relative: 1
Lymphocytes Relative: 5 — ABNORMAL LOW
Lymphs Abs: 0.4 — ABNORMAL LOW
Monocytes Absolute: 0.3
Monocytes Relative: 4
Neutro Abs: 6.8
Neutrophils Relative %: 91 — ABNORMAL HIGH

## 2011-03-21 LAB — POCT I-STAT CREATININE
Creatinine, Ser: 1.1
Operator id: 294511

## 2011-03-21 LAB — URINALYSIS, ROUTINE W REFLEX MICROSCOPIC
Glucose, UA: NEGATIVE
Ketones, ur: 40 — AB
Leukocytes, UA: NEGATIVE
Nitrite: NEGATIVE
Protein, ur: NEGATIVE
Specific Gravity, Urine: 1.031 — ABNORMAL HIGH
Urobilinogen, UA: 0.2
pH: 6

## 2011-03-21 LAB — URINE MICROSCOPIC-ADD ON

## 2011-03-21 LAB — LIPASE, BLOOD: Lipase: 21

## 2011-03-21 LAB — POCT PREGNANCY, URINE
Operator id: 294511
Preg Test, Ur: NEGATIVE

## 2011-06-15 ENCOUNTER — Ambulatory Visit (INDEPENDENT_AMBULATORY_CARE_PROVIDER_SITE_OTHER): Payer: Self-pay | Admitting: General Surgery

## 2011-06-18 ENCOUNTER — Telehealth: Payer: Self-pay | Admitting: Oncology

## 2011-06-18 NOTE — Telephone Encounter (Signed)
lmonvm advising the pt of her new pt appt with dr ha and for her to call me back to confirm

## 2011-06-18 NOTE — Telephone Encounter (Signed)
Pt called to confirm her appts for jan with dr ha. Pt requested a new d/t for her appt.

## 2011-06-26 ENCOUNTER — Encounter: Payer: Self-pay | Admitting: Oncology

## 2011-06-26 DIAGNOSIS — F411 Generalized anxiety disorder: Secondary | ICD-10-CM | POA: Insufficient documentation

## 2011-06-26 DIAGNOSIS — I10 Essential (primary) hypertension: Secondary | ICD-10-CM | POA: Insufficient documentation

## 2011-06-26 DIAGNOSIS — D509 Iron deficiency anemia, unspecified: Secondary | ICD-10-CM | POA: Insufficient documentation

## 2011-06-26 DIAGNOSIS — F419 Anxiety disorder, unspecified: Secondary | ICD-10-CM | POA: Insufficient documentation

## 2011-07-01 ENCOUNTER — Other Ambulatory Visit: Payer: BC Managed Care – PPO | Admitting: Lab

## 2011-07-01 ENCOUNTER — Ambulatory Visit: Payer: BC Managed Care – PPO | Admitting: Oncology

## 2011-07-06 ENCOUNTER — Encounter: Payer: Self-pay | Admitting: Oncology

## 2011-07-07 ENCOUNTER — Encounter: Payer: Self-pay | Admitting: Oncology

## 2011-07-07 ENCOUNTER — Telehealth: Payer: Self-pay | Admitting: Oncology

## 2011-07-07 ENCOUNTER — Ambulatory Visit: Payer: BC Managed Care – PPO

## 2011-07-07 ENCOUNTER — Ambulatory Visit (HOSPITAL_BASED_OUTPATIENT_CLINIC_OR_DEPARTMENT_OTHER): Payer: BC Managed Care – PPO | Admitting: Oncology

## 2011-07-07 ENCOUNTER — Other Ambulatory Visit: Payer: BC Managed Care – PPO | Admitting: Lab

## 2011-07-07 DIAGNOSIS — D649 Anemia, unspecified: Secondary | ICD-10-CM

## 2011-07-07 DIAGNOSIS — D509 Iron deficiency anemia, unspecified: Secondary | ICD-10-CM

## 2011-07-07 DIAGNOSIS — F419 Anxiety disorder, unspecified: Secondary | ICD-10-CM

## 2011-07-07 DIAGNOSIS — F411 Generalized anxiety disorder: Secondary | ICD-10-CM

## 2011-07-07 DIAGNOSIS — I10 Essential (primary) hypertension: Secondary | ICD-10-CM

## 2011-07-07 DIAGNOSIS — K219 Gastro-esophageal reflux disease without esophagitis: Secondary | ICD-10-CM

## 2011-07-07 DIAGNOSIS — D684 Acquired coagulation factor deficiency: Secondary | ICD-10-CM

## 2011-07-07 DIAGNOSIS — N920 Excessive and frequent menstruation with regular cycle: Secondary | ICD-10-CM

## 2011-07-07 LAB — CBC & DIFF AND RETIC
BASO%: 1.1 % (ref 0.0–2.0)
Basophils Absolute: 0.1 10*3/uL (ref 0.0–0.1)
EOS%: 8.3 % — ABNORMAL HIGH (ref 0.0–7.0)
Eosinophils Absolute: 0.4 10*3/uL (ref 0.0–0.5)
HCT: 34.1 % — ABNORMAL LOW (ref 34.8–46.6)
HGB: 11.1 g/dL — ABNORMAL LOW (ref 11.6–15.9)
Immature Retic Fract: 9.7 % (ref 1.60–10.00)
LYMPH%: 34 % (ref 14.0–49.7)
MCH: 27.3 pg (ref 25.1–34.0)
MCHC: 32.6 g/dL (ref 31.5–36.0)
MCV: 83.8 fL (ref 79.5–101.0)
MONO#: 0.3 10*3/uL (ref 0.1–0.9)
MONO%: 6.8 % (ref 0.0–14.0)
NEUT#: 2.3 10*3/uL (ref 1.5–6.5)
NEUT%: 49.8 % (ref 38.4–76.8)
Platelets: 204 10*3/uL (ref 145–400)
RBC: 4.07 10*6/uL (ref 3.70–5.45)
RDW: 22.2 % — ABNORMAL HIGH (ref 11.2–14.5)
Retic %: 1.19 % (ref 0.70–2.10)
Retic Ct Abs: 48.43 10*3/uL (ref 33.70–90.70)
WBC: 4.7 10*3/uL (ref 3.9–10.3)
lymph#: 1.6 10*3/uL (ref 0.9–3.3)
nRBC: 0 % (ref 0–0)

## 2011-07-07 LAB — MORPHOLOGY: PLT EST: ADEQUATE

## 2011-07-07 LAB — CHCC SMEAR

## 2011-07-07 LAB — COMPREHENSIVE METABOLIC PANEL
ALT: 12 U/L (ref 0–35)
AST: 17 U/L (ref 0–37)
Albumin: 3.9 g/dL (ref 3.5–5.2)
Alkaline Phosphatase: 63 U/L (ref 39–117)
BUN: 12 mg/dL (ref 6–23)
CO2: 27 mEq/L (ref 19–32)
Calcium: 10 mg/dL (ref 8.4–10.5)
Chloride: 104 mEq/L (ref 96–112)
Creatinine, Ser: 0.94 mg/dL (ref 0.50–1.10)
Glucose, Bld: 91 mg/dL (ref 70–99)
Potassium: 3.8 mEq/L (ref 3.5–5.3)
Sodium: 139 mEq/L (ref 135–145)
Total Bilirubin: 0.1 mg/dL — ABNORMAL LOW (ref 0.3–1.2)
Total Protein: 7.7 g/dL (ref 6.0–8.3)

## 2011-07-07 LAB — LACTATE DEHYDROGENASE: LDH: 179 U/L (ref 94–250)

## 2011-07-07 NOTE — Telephone Encounter (Signed)
APPTS MADE FOR April,JULY AND October,CARD TO PT     [facility]

## 2011-07-07 NOTE — Progress Notes (Signed)
[facility] INITIAL HEMATOLOGY CONSULTATION  Referral MD:  Wilmon Arms, M.D.  Reason for Referral:  Iron-deficiency anemia.   HPI: Ms. Ralls is a 51 yo African American with history of uterine fibroid, menometrorrhagia.  She was on oral contraceptive in the past; however, she decided to stop it due to feeling hormonal imbalanced as she has been approaching menopause.  Her oldest CBC from 05/03/2006 showed WBC 6.5; Hgb 11.9; Plt 196. She started feeling more fatigue last year with intermittent hematochezia, constipation.  A oldest CBC from her PCP office showed WBC 3.7 ;Hgb 9.0; Plt 214 on 04/07/2011.  She was referred to Dr. Loreta Ave and had colonoscopy which was negative on 09/24/2010.  She had EGD which showed gastric polyp.  A random duodenal proximal biopsy showed mild duodenal intraepithelial lymphocytosis.  She was advised to avoid gluten which has improved her symptoms.  She has been taking oral iron and for the last month has increased it to TID from BID.    She was kindly referred to the [facility] for evaluation.  She reports that with increased oral iron, her symptoms of fatigue has slightly improved.  She still has menometrorrhagia.  She changes up to 5 tampons/pads daily during her menstrual cycle.  She no longer has hematochezia.  She denies hematemasis, hemoptysis, melena, hematuria.  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  She denies headache, confusion, visual changes, SOB, dizziness, skin rash, back pain, paresthesia, depression.     Past Medical History  Diagnosis Date  . HTN (hypertension)   . Anxiety   . Anemia 11/2010  . GERD (gastroesophageal reflux disease)   . Migraine   . Fibromyalgia   . Uterine fibroid   . Internal hemorrhoids   . Gastric polyp   :    Past Surgical History  Procedure Date  . Cholecystectomy 2003  . Myomectomy 1998  . Cesarean section   . Exploratory laparotomy   . Cervical  fusion 2010  :   CURRENT MEDS: Current Outpatient Prescriptions  Medication Sig Dispense Refill  . amLODipine (NORVASC) 5 MG tablet Take 5 mg by mouth daily.        . calcium carbonate (OS-CAL) 600 MG TABS Take 600 mg by mouth daily.        . Cholecalciferol ([facility] VIT D 5000 HIGH-POTENCY) 5000 UNITS capsule Take 2,000 Units by mouth daily.       Marland Kitchen dexlansoprazole (DEXILANT) 60 MG capsule Take 60 mg by mouth daily.        . ferrous sulfate 325 (65 FE) MG tablet Take 325 mg by mouth 3 (three) times daily with meals.      . magnesium 30 MG tablet Take 30 mg by mouth daily.        . Multiple Vitamin (MULTIVITAMIN) tablet Take 1 tablet by mouth daily.        . Probiotic Product (PROBIOTIC ACIDOPHILUS PO) Take 1 tablet by mouth daily.        . vitamin E 100 UNIT capsule Take 100 Units by mouth daily.        . Ascorbic Acid (VITAMIN C) 1000 MG tablet Take 1,000 mg by mouth daily.            Allergies  Allergen Reactions  . Gluten Meal   . Percocet (Oxycodone-Acetaminophen)   . Quinolones   . Sulfa Drugs Cross Reactors   . Vicodin (Hydrocodone-Acetaminophen)   :  Family  History  Problem Relation Age of Onset  . Cancer Mother     colon CA  . Hypertension Mother   . Dementia Mother   . Deep vein thrombosis Father   . Hypertension Father   . Cancer Father     prostate CA  . Hypertension Sister   . Hyperlipidemia Sister   . Hypertension Brother   . Hyperlipidemia Brother   . Hypertension Maternal Aunt   . Hyperlipidemia Maternal Aunt   . Rheum arthritis Maternal Aunt   :  History   Social History  . Marital Status: Legally Separated    Spouse Name: N/A    Number of Children: 1  . Years of Education: N/A   Occupational History  .      molecular Production manager, PhD candidate, [employer]   Social History Main Topics  . Smoking status: Never Smoker   . Smokeless tobacco: Never Used  . Alcohol Use: No  . Drug Use: No  . Sexually Active: No   Other Topics Concern   . Not on file   Social History Narrative  . No narrative on file    REVIEW OF SYSTEM:  The rest of the 14-point review of sytem was negative.   Exam:   General:  well-nourished in no acute distress.  Eyes:  no scleral icterus.  ENT:  There were no oropharyngeal lesions.  Neck was without thyromegaly.  Lymphatics:  Negative cervical, supraclavicular or axillary adenopathy.  Respiratory: lungs were clear bilaterally without wheezing or crackles.  Cardiovascular:  Regular rate and rhythm, S1/S2, without murmur, rub or gallop.  There was no pedal edema.  GI:  abdomen was soft, flat, nontender, nondistended, without organomegaly.  Muscoloskeletal:  no spinal tenderness of palpation of vertebral spine.  Skin exam was without echymosis, petichae.  Neuro exam was nonfocal.  Patient was able to get on and off exam table without assistance.  Gait was normal.  Patient was alerted and oriented.  Attention was good.   Language was appropriate.  Mood was normal without depression.  Speech was not pressured.  Thought content was not tangential.    LABS:  Lab Results  Component Value Date   WBC 4.7 07/07/2011   HGB 11.1* 07/07/2011   HCT 34.1* 07/07/2011   PLT 204 07/07/2011   GLUCOSE 91 07/07/2011   ALT 12 07/07/2011   AST 17 07/07/2011   NA 139 07/07/2011   K 3.8 07/07/2011   CL 104 07/07/2011   CREATININE 0.94 07/07/2011   BUN 12 07/07/2011   CO2 27 07/07/2011   INR 1.08 10/08/2009     Blood smear review:   I personally reviewed the patient's peripheral blood smear today.  There was anisocytosis and microcytosis.  There was no peripheral blast.  There was no schistocytosis, spherocytosis, target cell, rouleaux formation, tear drop cell.  There was no giant platelets or platelet clumps.      ASSESSMENT AND PLAN:  1.  Iron-deficiency anemia:  Most likely from menometrorrhagia vs malabsorption.  She has been on TID of oral iron with improvement of her iron store and Hgb and also her symptoms.  I  advised her to continue with this regimen.  There is no indication for pRBC transfusion or IV iron.  I will check her CBC in 3 and 6 months, and I will see her in about 9 months.  In the future, if she has worsening iron deficiency anemia, I may consider IV iron.  If despite iron repletion,  and she still has anemia, I may consider further work up.    2.  Memometrorrhagia from uterine fibroid:  She does not want to be on oral contraceptives.  She does not want to undergo hyterectomy or any invasive procedure.  I advised her that over the next few years, when she is post menopause, this issue should revolve.   3.  Gluten allergy:  She is attempting to adhere to a gluten-free diet.   4.  Hypertension: well controlled on Amlodipine per PCP.   5.  GERD:  Well controlled on Dexlansoprazole and probiotic.   6.  Anxiety, Fibromyalgia.   The length of time of the face-to-face encounter was 30 minutes. More than 50% of time was spent counseling and coordination of care.

## 2011-07-11 LAB — PROTEIN ELECTROPHORESIS, SERUM
Albumin ELP: 60.9 % (ref 55.8–66.1)
Alpha-1-Globulin: 3.9 % (ref 2.9–4.9)
Alpha-2-Globulin: 10 % (ref 7.1–11.8)
Beta 2: 3.5 % (ref 3.2–6.5)
Beta Globulin: 5.4 % (ref 4.7–7.2)
Gamma Globulin: 16.3 % (ref 11.1–18.8)
Total Protein, Serum Electrophoresis: 6.9 g/dL (ref 6.0–8.3)

## 2011-07-11 LAB — SEDIMENTATION RATE: Sed Rate: 10 mm/hr (ref 0–22)

## 2011-07-11 LAB — IRON AND TIBC
%SAT: 10 % — ABNORMAL LOW (ref 20–55)
Iron: 36 ug/dL — ABNORMAL LOW (ref 42–145)
TIBC: 364 ug/dL (ref 250–470)
UIBC: 328 ug/dL (ref 125–400)

## 2011-07-11 LAB — FERRITIN: Ferritin: 13 ng/mL (ref 10–291)

## 2011-07-11 LAB — ERYTHROPOIETIN: Erythropoietin: 15.9 m[IU]/mL (ref 2.6–34.0)

## 2011-08-09 ENCOUNTER — Encounter: Payer: Self-pay | Admitting: Medical

## 2011-08-09 ENCOUNTER — Ambulatory Visit (INDEPENDENT_AMBULATORY_CARE_PROVIDER_SITE_OTHER): Payer: BC Managed Care – PPO | Admitting: Medical

## 2011-08-09 VITALS — BP 110/70 | HR 76 | Temp 97.8°F | Resp 18 | Wt 125.0 lb

## 2011-08-09 DIAGNOSIS — J329 Chronic sinusitis, unspecified: Secondary | ICD-10-CM

## 2011-08-09 MED ORDER — AMOXICILLIN 875 MG PO TABS: 875.0000 mg | ORAL_TABLET | Freq: Two times a day (BID) | ORAL | Status: AC

## 2011-08-09 NOTE — Progress Notes (Signed)
Subjective:  Shari Lawrence is a 51 y.o. female who presents for possible sinus infection.   She report 2 wk hx/o headache above the eyes, scratchy throat, runny nose, drainage, nasal congestion, body aches, and not feeling well in general. Some cough.  However,r in the last 3 days, sinus pressure, teeth pain, decreased sense of smell, ear pain, worse on right, yellow mucous discharge from nose.  She has +sick contacts.  Using Zycam, Claritin, Mucinex.  No other aggravating or relieving factors.  No other c/o.  Past Medical History  Diagnosis Date  . HTN (hypertension)   . Anxiety   . Anemia 11/2010  . GERD (gastroesophageal reflux disease)   . Migraine   . Fibromyalgia   . Uterine fibroid   . Internal hemorrhoids   . Gastric polyp    ROS: Gen: subj fever Lungs: no sob, wheezing GI: no pain, NVD   Objective:   Filed Vitals:   08/09/11 1524  BP: 110/70  Pulse: 76  Temp: 97.8 F (36.6 C)  Resp: 18    General appearance: Alert, WD/WN, no distress                             Skin: warm, no rash                           Head: + moderate frontal and maxillary sinus tenderness,                            Eyes: conjunctiva pale, corneas clear, PERRLA                            Ears: right TM somewhat retracted, left TM normal, external ear canals normal                          Nose: septum midline, turbinates swollen, with erythema and clear discharge             Mouth/throat: MMM, tongue normal, mild pharyngeal erythema                           Neck: supple, shoddy adenopathy, no thyromegaly, nontender                          Heart: RRR, normal S1, S2, no murmurs                         Lungs: CTA bilaterally, no wheezes, rales, or rhonchi      Assessment and Plan:   Encounter Diagnosis  Name Primary?  . Sinusitis Yes   Prescription given for Amoxicillin.  Can use OTC Mucinex for congestion.  Tylenol or Ibuprofen OTC for fever and malaise.  Discussed symptomatic relief,  nasal saline, and call or return if worse or not improving in 2-3 days.

## 2011-08-09 NOTE — Patient Instructions (Signed)

## 2011-08-16 ENCOUNTER — Other Ambulatory Visit: Payer: Self-pay | Admitting: Obstetrics and Gynecology

## 2011-08-16 DIAGNOSIS — Z1231 Encounter for screening mammogram for malignant neoplasm of breast: Secondary | ICD-10-CM

## 2011-08-30 ENCOUNTER — Telehealth: Payer: Self-pay | Admitting: Oncology

## 2011-08-30 NOTE — Telephone Encounter (Signed)
pt called and r/s lab appt for 04/10 to 04/12

## 2011-09-09 ENCOUNTER — Ambulatory Visit (HOSPITAL_COMMUNITY)
Admission: RE | Admit: 2011-09-09 | Discharge: 2011-09-09 | Disposition: A | Discharge status: Home / Self Care | Payer: BC Managed Care – PPO | Source: Ambulatory Visit | Attending: Obstetrics and Gynecology | Admitting: Obstetrics and Gynecology

## 2011-09-09 DIAGNOSIS — Z1231 Encounter for screening mammogram for malignant neoplasm of breast: Secondary | ICD-10-CM | POA: Insufficient documentation

## 2011-09-14 ENCOUNTER — Encounter: Payer: Self-pay | Admitting: Medical

## 2011-09-14 ENCOUNTER — Ambulatory Visit (INDEPENDENT_AMBULATORY_CARE_PROVIDER_SITE_OTHER): Payer: BC Managed Care – PPO | Admitting: Medical

## 2011-09-14 VITALS — BP 120/70 | HR 78 | Temp 98.2°F | Resp 16 | Wt 127.0 lb

## 2011-09-14 DIAGNOSIS — R7301 Impaired fasting glucose: Secondary | ICD-10-CM

## 2011-09-14 LAB — POCT URINALYSIS DIPSTICK
Bilirubin, UA: NEGATIVE
Blood, UA: NEGATIVE
Glucose, UA: NEGATIVE
Ketones, UA: NEGATIVE
Leukocytes, UA: NEGATIVE
Nitrite, UA: NEGATIVE
Protein, UA: NEGATIVE
Spec Grav, UA: 1.015
Urobilinogen, UA: NEGATIVE
pH, UA: 6

## 2011-09-14 LAB — POCT GLYCOSYLATED HEMOGLOBIN (HGB A1C): Hemoglobin A1C: 5.5

## 2011-09-14 NOTE — Patient Instructions (Addendum)
Begin checking your glucose as indicated below.  Please write down the numbers so I can them.  Use a chart.  I want you to check your glucose every morning for the next 1-2 weeks.   At random times, check a before meal, and check 2 hours after meal readings.  For example, tonight check your glucose prior to dinner, then 2 hours later, check it again.   Again, write these numbers down in a chart form based on date, time, before meal, after meal, etc.   General guidelines: Fasting glucose over 124 - is bad Glucose over 200, 2 hours after a meal is bad Any time reading at random over 200 is typically not good

## 2011-09-14 NOTE — Progress Notes (Signed)
Subjective:   HPI  Shari Lawrence is a 51 y.o. female who presents for concern for diabetes.  She went to Dr. Lavonia Drafts back in December 2012, had checkup on iron deficiency anemia, ended up having EKG and colonoscopy last year, had a polyp in her stomach.  Labs were done at Dr. Kenna Gilbert.   When she went to her gynecologist today who looked over the labs, there was a concern for diabetes.  She has large fibroids, and her gynecologist is planning surgery soon, but wanted the diabetes issue figured out first.  Thus, she is here today.  She does note prior elevated HgbA1Cs.  Her anemia has finally been improving after getting on new iron formulation.  She apparently wasn't tolerating prior iron therapies.   She notes some increased urination, blurry vision, some dry mouth, but no weigh changes, no polydipsia, no dizziness. Her last eye doctor visit was 2 years ago.  She notes 1st cousin has diabetes type 1.  Paternal aunt with type II, maternal aunt and uncle type II, but no first degree diabetes in family.  No other c/o.  The following portions of the patient's history were reviewed and updated as appropriate: allergies, current medications, past family history, past medical history, past social history, past surgical history and problem list.  Past Medical History  Diagnosis Date  . HTN (hypertension)   . Anxiety   . Anemia 11/2010  . GERD (gastroesophageal reflux disease)   . Migraine   . Fibromyalgia   . Uterine fibroid   . Internal hemorrhoids   . Gastric polyp     Allergies  Allergen Reactions  . Gluten Meal   . Latex   . Percocet (Oxycodone-Acetaminophen)   . Quinolones   . Sulfa Drugs Cross Reactors   . Vicodin (Hydrocodone-Acetaminophen)      Review of Systems ROS reviewed and was negative other than noted in HPI or above.    Objective:   Physical Exam  General appearance: alert, no distress, WD/WN, lean tall black female Oral cavity: MMM, no lesions Neck: supple, no  lymphadenopathy, no thyromegaly, no masses Heart: RRR, normal S1, S2, no murmurs Lungs: CTA bilaterally, no wheezes, rhonchi, or rales Abdomen: +bs, soft, non tender, non distended, palpable uterine fibroids, no hepatomegaly, no splenomegaly Pulses: 2+ symmetric   Assessment and Plan :     Encounter Diagnosis  Name Primary?  . Impaired fasting blood sugar Yes   I reviewed the notes and labs from Dr. Loreta Ave showing HgbA1C of 6.2% on 06/07/11.  Urinalysis today normal, HgbA1C today 5.5%.  We demonstrated glucometer testing today.  She will use the glucometer and check fasting readings as well as pre and 2 hour post prandial readings and get these to me in 2 weeks.  Pending results, we will decide next steps.   Of note, she is exercising some, is eating healthy and gluten free diet for the most part.

## 2011-09-19 ENCOUNTER — Encounter (HOSPITAL_COMMUNITY): Payer: Self-pay | Admitting: Emergency Medicine

## 2011-09-19 ENCOUNTER — Emergency Department (INDEPENDENT_AMBULATORY_CARE_PROVIDER_SITE_OTHER)
Admission: EM | Admit: 2011-09-19 | Discharge: 2011-09-19 | Disposition: A | Discharge status: Home / Self Care | Payer: BC Managed Care – PPO | Source: Home / Self Care | Attending: Family Medicine | Admitting: Family Medicine

## 2011-09-19 DIAGNOSIS — Z041 Encounter for examination and observation following transport accident: Secondary | ICD-10-CM

## 2011-09-19 DIAGNOSIS — M542 Cervicalgia: Secondary | ICD-10-CM

## 2011-09-19 HISTORY — DX: Hyperlipidemia, unspecified: E78.5

## 2011-09-19 MED ORDER — CYCLOBENZAPRINE HCL 5 MG PO TABS: 5.0000 mg | ORAL_TABLET | Freq: Three times a day (TID) | ORAL | Status: AC | PRN

## 2011-09-19 MED ORDER — IBUPROFEN 800 MG PO TABS: 800.0000 mg | ORAL_TABLET | Freq: Three times a day (TID) | ORAL | Status: AC

## 2011-09-19 NOTE — Discharge Instructions (Signed)
Use heat and medicines as needed, activity as tolerated, see your doctor if further problems.
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
## 2011-10-05 ENCOUNTER — Other Ambulatory Visit: Payer: BC Managed Care – PPO

## 2011-10-07 ENCOUNTER — Other Ambulatory Visit: Payer: BC Managed Care – PPO | Admitting: Lab

## 2011-10-19 ENCOUNTER — Telehealth: Payer: Self-pay | Admitting: Family Medicine

## 2011-10-19 NOTE — Telephone Encounter (Signed)
PATIENT WAS NOTIFIED THAT SHE NEEDS TO F/U WITH [facility] AND SHE HAS AN APPT. 10/25/11. CLS

## 2011-10-19 NOTE — Telephone Encounter (Signed)
Message copied by Janeice Robinson on Wed Oct 19, 2011 11:20 AM ------      Message from: Aleen Campi, Kermit Balo      Created: Tue Oct 18, 2011  8:16 PM       She needs to return for f/u on glucose readings.  Insurer sent letter stating that she may need endoscopy given hx/o GERD on Dexilant.  We can discuss further

## 2011-10-25 ENCOUNTER — Encounter: Payer: Self-pay | Admitting: Medical

## 2011-10-25 ENCOUNTER — Ambulatory Visit (INDEPENDENT_AMBULATORY_CARE_PROVIDER_SITE_OTHER): Payer: BC Managed Care – PPO | Admitting: Medical

## 2011-10-25 VITALS — BP 110/78 | HR 56 | Temp 98.1°F | Wt 123.0 lb

## 2011-10-25 DIAGNOSIS — R7301 Impaired fasting glucose: Secondary | ICD-10-CM

## 2011-10-25 DIAGNOSIS — D259 Leiomyoma of uterus, unspecified: Secondary | ICD-10-CM | POA: Insufficient documentation

## 2011-10-25 DIAGNOSIS — D649 Anemia, unspecified: Secondary | ICD-10-CM

## 2011-10-25 DIAGNOSIS — K219 Gastro-esophageal reflux disease without esophagitis: Secondary | ICD-10-CM

## 2011-10-25 DIAGNOSIS — I10 Essential (primary) hypertension: Secondary | ICD-10-CM | POA: Insufficient documentation

## 2011-10-25 DIAGNOSIS — K900 Celiac disease: Secondary | ICD-10-CM

## 2011-10-25 DIAGNOSIS — K9041 Non-celiac gluten sensitivity: Secondary | ICD-10-CM | POA: Insufficient documentation

## 2011-10-25 NOTE — Progress Notes (Signed)
Subjective:   HPI  Shari Lawrence is a 51 y.o. female who presents for recheck on blood sugar primarily.  I saw her recently for c/o possible diabetes.  A hgbA1C at her gastroenterologist's showed 6.2%, but here last visit she was 5.5%.  She is lean, eats healthy, gluten free diet, and has been feeling fine in normal state of health.  She has been checking glucose readings some, and all of the fasting morning numbers and post prandial numbers she brought today appear normal.  Since last visit here, gynecology has recommend surgery at [facility] to remove her large fibroids, but she thinks that due to her periods getting lighter, she is hopeful that with onset of menopause hopefully in the near future, maybe the fibroids will resolve on their own.  She doesn't want to pursue surgery at this time, and gynecology won't perform surgery until we feel confident she is not diabetic.  She does have some premenopausal symptoms of vaginal dryness, sweats and some more frequent UTIs.  She had endoscopy last year through GI/Dr. Loreta Ave for anemia, is on iron, and was subsequently referred to hematology for anemia.  She was advised to have CBC every 107mo, and was due this month at hematology but can't afford to go their since copays are too high.  She would like CBC today here.  She is compliant with her BP medication without c/o.  She is also compliant with Dexilant for GERD without c/o.   No other c/o.  The following portions of the patient's history were reviewed and updated as appropriate: allergies, current medications, past family history, past medical history, past social history, past surgical history and problem list.  Past Medical History  Diagnosis Date  . HTN (hypertension)   . Anxiety   . Anemia 11/2010  . GERD (gastroesophageal reflux disease)   . Migraine   . Fibromyalgia   . Uterine fibroid   . Internal hemorrhoids   . Gastric polyp   . Hyperlipemia     Allergies  Allergen Reactions  . Gluten  Meal   . Latex   . Percocet (Oxycodone-Acetaminophen)   . Quinolones   . Sulfa Drugs Cross Reactors   . Vicodin (Hydrocodone-Acetaminophen)      Review of Systems ROS reviewed and was negative other than noted in HPI or above.    Objective:   Physical Exam  General appearance: alert, no distress, WD/WN Neck: supple, no lymphadenopathy, no thyromegaly, no masses Heart: RRR, normal S1, S2, no murmurs Lungs: CTA bilaterally, no wheezes, rhonchi, or rales   Assessment and Plan :     Encounter Diagnoses  Name Primary?  Marland Kitchen Anemia Yes  . Impaired fasting glucose   . Uterine fibroid   . Gluten intolerance   . GERD (gastroesophageal reflux disease)   . Essential hypertension, benign    Anemia - reviewed recent hematology notes.  CBC today and will send copies to Dr. Ha/hematology as well as Dr. Lavonia Drafts.   Impaired fasting glucose - 09/14/11 HgbA1C 5.5%, not diabetic.    Uterine fibroids - she is hoping that with onset of menopause that maybe the fibroids will start to resolve on their own.  She wants to avoid surgery for now and use watch and wait approach.  Of note, her last period was very light   Gluten intolerance - c/t gluten free diet, weight is stable, she seems to be eating a good variety of nutrient rich foods  GERD - c/t Dexilant, controlled, avoid triggers, of  note endoscopy, upper and lower 08/2010 with Dr. Loreta Ave  HTN - controlled on current medication.

## 2011-10-26 ENCOUNTER — Telehealth: Payer: Self-pay | Admitting: Family Medicine

## 2011-10-26 LAB — CBC WITH DIFFERENTIAL/PLATELET
Basophils Absolute: 0 10*3/uL (ref 0.0–0.1)
Basophils Relative: 1 % (ref 0–1)
Eosinophils Absolute: 0.4 10*3/uL (ref 0.0–0.7)
Eosinophils Relative: 9 % — ABNORMAL HIGH (ref 0–5)
HCT: 39.2 % (ref 36.0–46.0)
Hemoglobin: 12.7 g/dL (ref 12.0–15.0)
Lymphocytes Relative: 43 % (ref 12–46)
Lymphs Abs: 1.8 10*3/uL (ref 0.7–4.0)
MCH: 29.7 pg (ref 26.0–34.0)
MCHC: 32.4 g/dL (ref 30.0–36.0)
MCV: 91.8 fL (ref 78.0–100.0)
Monocytes Absolute: 0.4 10*3/uL (ref 0.1–1.0)
Monocytes Relative: 9 % (ref 3–12)
Neutro Abs: 1.7 10*3/uL (ref 1.7–7.7)
Neutrophils Relative %: 39 % — ABNORMAL LOW (ref 43–77)
Platelets: 151 10*3/uL (ref 150–400)
RBC: 4.27 MIL/uL (ref 3.87–5.11)
RDW: 14.4 % (ref 11.5–15.5)
WBC: 4.3 10*3/uL (ref 4.0–10.5)

## 2011-10-26 NOTE — Telephone Encounter (Signed)
Message copied by Janeice Robinson on Wed Oct 26, 2011  2:42 PM ------      Message from: Jac Canavan      Created: Tue Oct 25, 2011 10:36 PM       Copies of OV note and labs to Dr. Ha/hematology and gynecology/Dr. Stefanie Libel or gyn other, and copy to Dr. Loreta Ave.

## 2011-10-26 NOTE — Telephone Encounter (Signed)
I FAX OVER LAB RESULTS TO DR. HA, DR.ROMIN AND DR. MANN. CLS

## 2011-11-05 ENCOUNTER — Other Ambulatory Visit: Payer: Self-pay | Admitting: Family Medicine

## 2012-01-04 ENCOUNTER — Other Ambulatory Visit: Payer: BC Managed Care – PPO | Admitting: Lab

## 2012-02-04 ENCOUNTER — Encounter (HOSPITAL_COMMUNITY): Payer: Self-pay

## 2012-02-04 ENCOUNTER — Emergency Department (INDEPENDENT_AMBULATORY_CARE_PROVIDER_SITE_OTHER)
Admission: EM | Admit: 2012-02-04 | Discharge: 2012-02-04 | Disposition: A | Discharge status: Home / Self Care | Payer: BC Managed Care – PPO | Source: Home / Self Care | Attending: Emergency Medicine | Admitting: Emergency Medicine

## 2012-02-04 DIAGNOSIS — S61209A Unspecified open wound of unspecified finger without damage to nail, initial encounter: Secondary | ICD-10-CM

## 2012-02-04 DIAGNOSIS — S61210A Laceration without foreign body of right index finger without damage to nail, initial encounter: Secondary | ICD-10-CM

## 2012-02-04 DIAGNOSIS — Z23 Encounter for immunization: Secondary | ICD-10-CM

## 2012-02-04 MED ORDER — TETANUS-DIPHTH-ACELL PERTUSSIS 5-2.5-18.5 LF-MCG/0.5 IM SUSP
INTRAMUSCULAR | Status: AC
  Filled 2012-02-04: qty 0.5

## 2012-02-04 MED ORDER — BACITRACIN 500 UNIT/GM EX OINT
1.0000 "application " | TOPICAL_OINTMENT | Freq: Once | CUTANEOUS | Status: AC
  Administered 2012-02-04: 1 via TOPICAL

## 2012-02-04 MED ORDER — IBUPROFEN 800 MG PO TABS: 800.0000 mg | ORAL_TABLET | Freq: Three times a day (TID) | ORAL | Status: AC | PRN

## 2012-02-04 MED ORDER — LIDOCAINE-EPINEPHRINE 2 %-1:100000 IJ SOLN: 5.0000 mL | Freq: Once | INTRAMUSCULAR | Status: DC

## 2012-02-04 MED ORDER — TETANUS-DIPHTHERIA TOXOIDS TD 5-2 LFU IM INJ
0.5000 mL | INJECTION | Freq: Once | INTRAMUSCULAR | Status: AC
  Administered 2012-02-04: 0.5 mL via INTRAMUSCULAR

## 2012-02-04 NOTE — ED Provider Notes (Signed)
History     CSN: [id number]  Arrival date & time 02/04/12  1539   First MD Initiated Contact with Patient 02/04/12 1540      Chief Complaint  Patient presents with  . Laceration    (Consider location/radiation/quality/duration/timing/severity/associated sxs/prior treatment) HPI Comments: Is a right-handed female who sustained a laceration to her right index finger while opening a metal can several hours prior to arrival. She washed out anything, applied pressure, and came here. No nausea, vomiting, numbness, weakness. No foreign body sensation. She is not a smoker or a diabetic.  ROS as noted in HPI. All other ROS negative.   Patient is a 51 y.o. female presenting with skin laceration. The history is provided by the patient. No language interpreter was used.  Laceration  The incident occurred 1 to 2 hours ago. The laceration is located on the right hand. The laceration is 3 cm in size. The laceration mechanism was a a metal edge. The pain has been constant since onset. She reports no foreign bodies present. Her tetanus status is unknown.    Past Medical History  Diagnosis Date  . HTN (hypertension)   . Anxiety   . Anemia 11/2010  . GERD (gastroesophageal reflux disease)   . Migraine   . Fibromyalgia   . Uterine fibroid   . Internal hemorrhoids   . Gastric polyp   . Hyperlipemia     Past Surgical History  Procedure Date  . Cholecystectomy 2003  . Myomectomy 1998  . Cesarean section   . Exploratory laparotomy   . Cervical fusion 2010  . Colonoscopy 08/2010    Dr. Loreta Ave  . Esophagogastroduodenoscopy 08/2010    Dr. Loreta Ave    Family History  Problem Relation Age of Onset  . Cancer Mother     colon CA  . Hypertension Mother   . Dementia Mother   . Deep vein thrombosis Father   . Hypertension Father   . Cancer Father     prostate CA  . Hypertension Sister   . Hyperlipidemia Sister   . Hypertension Brother   . Hyperlipidemia Brother   . Hypertension Maternal Aunt     . Hyperlipidemia Maternal Aunt   . Rheum arthritis Maternal Aunt   . Diabetes Maternal Aunt     1 with type II, 1 with type 1  . Diabetes Paternal Aunt     type II    History  Substance Use Topics  . Smoking status: Never Smoker   . Smokeless tobacco: Never Used  . Alcohol Use: No    OB History    Grav Para Term Preterm Abortions TAB SAB Ect Mult Living                  Review of Systems  Allergies  Gluten meal; Latex; Percocet; Quinolones; Sulfa drugs cross reactors; and Vicodin  Home Medications   Current Outpatient Rx  Name Route Sig Dispense Refill  . AMLODIPINE BESYLATE 5 MG PO TABS  TAKE 1 TABLET BY MOUTH EVERY DAY 90 tablet 2  . VITAMIN C 1000 MG PO TABS Oral Take 1,000 mg by mouth daily.      Marland Kitchen CALCIUM CARBONATE 600 MG PO TABS Oral Take 600 mg by mouth daily.      . CHOLECALCIFEROL 5000 UNITS PO CAPS Oral Take 2,000 Units by mouth daily.     . DEXLANSOPRAZOLE 60 MG PO CPDR Oral Take 60 mg by mouth daily.      Marland Kitchen FERROUS SULFATE 325 (  65 FE) MG PO TABS Oral Take 325 mg by mouth 3 (three) times daily with meals.    . IBUPROFEN 800 MG PO TABS Oral Take 1 tablet (800 mg total) by mouth every 8 (eight) hours as needed for pain or fever. 20 tablet 0  . MAGNESIUM 30 MG PO TABS Oral Take 30 mg by mouth daily.      Marland Kitchen ONE-DAILY MULTI VITAMINS PO TABS Oral Take 1 tablet by mouth daily.      Marland Kitchen PROBIOTIC ACIDOPHILUS PO Oral Take 1 tablet by mouth daily.      Marland Kitchen VITAMIN E 100 UNITS PO CAPS Oral Take 100 Units by mouth daily.        BP 150/100  Pulse 60  Temp 98.2 F (36.8 C) (Oral)  Resp 18  SpO2 100%  LMP 12/12/2011  Physical Exam  Nursing note and vitals reviewed. Constitutional: She is oriented to person, place, and time. She appears well-developed and well-nourished. No distress.  HENT:  Head: Normocephalic and atraumatic.  Eyes: Conjunctivae and EOM are normal.  Neck: Normal range of motion.  Cardiovascular: Normal rate.   Pulmonary/Chest: Effort normal.   Abdominal: She exhibits no distension.  Musculoskeletal: Normal range of motion.       Hands:      3 cm irregular laceration R index finger.  Hand with intact motor strength 5/5 flexion / extension / FDP  / FDS  against resistance and 2-point discrimination intact at 5mm in index finger. Wrist WNL. See photo  Neurological: She is alert and oriented to person, place, and time. Coordination normal.  Skin: Skin is warm and dry.  Psychiatric: She has a normal mood and affect. Her behavior is normal. Judgment and thought content normal.    ED Course  LACERATION REPAIR Date/Time: 02/04/2012 5:51 PM Performed by: Luiz Blare Authorized by: Luiz Blare Consent: Verbal consent obtained. Risks and benefits: risks, benefits and alternatives were discussed Consent given by: patient Patient understanding: patient states understanding of the procedure being performed Patient consent: the patient's understanding of the procedure matches consent given Procedure consent: procedure consent matches procedure scheduled Required items: required blood products, implants, devices, and special equipment available Patient identity confirmed: verbally with patient and arm band Time out: Immediately prior to procedure a "time out" was called to verify the correct patient, procedure, equipment, support staff and site/side marked as required. Body area: upper extremity Location details: right index finger Laceration length: 3 cm Foreign bodies: no foreign bodies Tendon involvement: none Nerve involvement: none Vascular damage: no Anesthesia: local infiltration Local anesthetic: lidocaine 2% with epinephrine Anesthetic total: 3 ml Patient sedated: no Preparation: Patient was prepped and draped in the usual sterile fashion. Irrigation solution: tap water (chlorhexadine solution) Irrigation method: syringe, jet lavage and tap Amount of cleaning: extensive Debridement: moderate Degree of  undermining: none Skin closure: 5-0 Prolene Number of sutures: 6 Technique: simple Approximation: close Approximation difficulty: simple Dressing: 4x4 sterile gauze, pressure dressing, antibiotic ointment and splint Patient tolerance: Patient tolerated the procedure well with no immediate complications. Comments: debrided nonviable skin. Wound(s) explored with adequate hemostasis through ROM, no apparent gross foreign body retained, no significant involvement of deep structures such as bone / joint / tendon / or neurovascular involvement noted.     (including critical care time)  Labs Reviewed - No data to display No results found.   1. Laceration of right index finger w/o foreign body w/o damage to nail     MDM  Will have her return here in 10 days for suture removal. Discussed signs and symptoms that should prompt an earlier return to the department. Patient agrees with plan.      Luiz Blare, MD 02/04/12 2128

## 2012-02-04 NOTE — ED Notes (Signed)
Pt cut rt index finger on a metal can one hour ago and has small amount of active bleeding and has been applying pressure.

## 2012-02-09 ENCOUNTER — Telehealth (HOSPITAL_COMMUNITY): Payer: Self-pay | Admitting: *Deleted

## 2012-02-09 NOTE — ED Notes (Signed)
Pt. called and said her finger is swollen and has numbness in it. I asked her when the numbness started.  She said today.  States it feels cool as well. I told her she needed to come back to get it rechecked.  She said she did not think she could afford it, but maybe she could borrow some money.  I explained it was important to get a recheck because it could be from decreased circulation or a nerve injury. Vassie Moselle 02/09/2012

## 2012-02-14 ENCOUNTER — Emergency Department (HOSPITAL_COMMUNITY)
Admission: EM | Admit: 2012-02-14 | Discharge: 2012-02-14 | Disposition: A | Discharge status: Home / Self Care | Payer: BC Managed Care – PPO | Source: Home / Self Care

## 2012-02-14 ENCOUNTER — Encounter (HOSPITAL_COMMUNITY): Payer: Self-pay

## 2012-02-14 DIAGNOSIS — IMO0002 Reserved for concepts with insufficient information to code with codable children: Secondary | ICD-10-CM

## 2012-02-14 DIAGNOSIS — Z4802 Encounter for removal of sutures: Secondary | ICD-10-CM

## 2012-02-14 NOTE — ED Provider Notes (Signed)
History     CSN: [id number]  Arrival date & time 02/14/12  1645   None     Chief Complaint  Patient presents with  . Suture / Staple Removal    (Consider location/radiation/quality/duration/timing/severity/associated sxs/prior treatment) Patient is a 51 y.o. female presenting with suture removal. The history is provided by the patient.  Suture / Staple Removal  The sutures were placed 7 to 10 days ago (to right anterior first finger). Treatments since wound repair include antibiotic ointment use. There has been no drainage from the wound. There is no redness present. The swelling has improved. The pain has improved. There is difficulty moving the extremity or digit due to pain.  Patient presents for suture removal. The wound is well healed without signs of infection Past Medical History  Diagnosis Date  . HTN (hypertension)   . Anxiety   . Anemia 11/2010  . GERD (gastroesophageal reflux disease)   . Migraine   . Fibromyalgia   . Uterine fibroid   . Internal hemorrhoids   . Gastric polyp   . Hyperlipemia     Past Surgical History  Procedure Date  . Cholecystectomy 2003  . Myomectomy 1998  . Cesarean section   . Exploratory laparotomy   . Cervical fusion 2010  . Colonoscopy 08/2010    Dr. Loreta Ave  . Esophagogastroduodenoscopy 08/2010    Dr. Loreta Ave    Family History  Problem Relation Age of Onset  . Cancer Mother     colon CA  . Hypertension Mother   . Dementia Mother   . Deep vein thrombosis Father   . Hypertension Father   . Cancer Father     prostate CA  . Hypertension Sister   . Hyperlipidemia Sister   . Hypertension Brother   . Hyperlipidemia Brother   . Hypertension Maternal Aunt   . Hyperlipidemia Maternal Aunt   . Rheum arthritis Maternal Aunt   . Diabetes Maternal Aunt     1 with type II, 1 with type 1  . Diabetes Paternal Aunt     type II    History  Substance Use Topics  . Smoking status: Never Smoker   . Smokeless tobacco: Never Used  .  Alcohol Use: No    OB History    Grav Para Term Preterm Abortions TAB SAB Ect Mult Living                  Review of Systems  All other systems reviewed and are negative.    Allergies  Gluten meal; Latex; Percocet; Quinolones; Sulfa drugs cross reactors; and Vicodin  Home Medications   Current Outpatient Rx  Name Route Sig Dispense Refill  . AMLODIPINE BESYLATE 5 MG PO TABS  TAKE 1 TABLET BY MOUTH EVERY DAY 90 tablet 2  . CHOLECALCIFEROL 5000 UNITS PO CAPS Oral Take 2,000 Units by mouth daily.     . DEXLANSOPRAZOLE 60 MG PO CPDR Oral Take 60 mg by mouth daily.      Marland Kitchen FERROUS SULFATE 325 (65 FE) MG PO TABS Oral Take 325 mg by mouth 3 (three) times daily with meals.    Marland Kitchen VITAMIN C 1000 MG PO TABS Oral Take 1,000 mg by mouth daily.      Marland Kitchen CALCIUM CARBONATE 600 MG PO TABS Oral Take 600 mg by mouth daily.      . IBUPROFEN 800 MG PO TABS Oral Take 1 tablet (800 mg total) by mouth every 8 (eight) hours as needed for pain or  fever. 20 tablet 0  . MAGNESIUM 30 MG PO TABS Oral Take 30 mg by mouth daily.      Marland Kitchen ONE-DAILY MULTI VITAMINS PO TABS Oral Take 1 tablet by mouth daily.      Marland Kitchen PROBIOTIC ACIDOPHILUS PO Oral Take 1 tablet by mouth daily.      Marland Kitchen VITAMIN E 100 UNITS PO CAPS Oral Take 100 Units by mouth daily.        BP 120/79  Pulse 62  Temp 98.5 F (36.9 C) (Oral)  Resp 16  SpO2 100%  LMP 01/11/2012  Physical Exam  Nursing note and vitals reviewed. Constitutional: She is oriented to person, place, and time. Vital signs are normal. She appears well-developed and well-nourished. She is active and cooperative.  HENT:  Head: Normocephalic.  Eyes: Conjunctivae are normal. Pupils are equal, round, and reactive to light. No scleral icterus.  Neck: Trachea normal. Neck supple.  Cardiovascular: Normal rate and regular rhythm.   Pulmonary/Chest: Effort normal and breath sounds normal.  Musculoskeletal:       Right hand: She exhibits tenderness. She exhibits normal range of motion,  no bony tenderness, normal two-point discrimination and normal capillary refill. normal sensation noted. Normal strength noted.       Hands: Neurological: She is alert and oriented to person, place, and time. No cranial nerve deficit or sensory deficit.  Skin: Skin is warm and dry.  Psychiatric: She has a normal mood and affect. Her speech is normal and behavior is normal. Judgment and thought content normal. Cognition and memory are normal.    ED Course  SUTURE REMOVAL Date/Time: 02/14/2012 5:26 PM Performed by: Nigel Mormon Jeramiah Mccaughey L Authorized by: Johnsie Kindred Consent: Verbal consent obtained. Risks and benefits: risks, benefits and alternatives were discussed Consent given by: patient Patient understanding: patient states understanding of the procedure being performed Patient consent: the patient's understanding of the procedure matches consent given Procedure consent: procedure consent matches procedure scheduled Patient identity confirmed: verbally with patient and arm band Time out: Immediately prior to procedure a "time out" was called to verify the correct patient, procedure, equipment, support staff and site/side marked as required. Body area: upper extremity Location details: right index finger Wound Appearance: clean and tender Sutures Removed: 6 Post-removal: dressing applied and antibiotic ointment applied Facility: sutures placed in this facility Patient tolerance: Patient tolerated the procedure well with no immediate complications.   (including critical care time)  Labs Reviewed - No data to display No results found.   1. Dressing change/suture removal       MDM  Antibiotic ointment and dressing until healed.       Johnsie Kindred, NP 02/14/12 1729

## 2012-02-14 NOTE — ED Notes (Signed)
NP student applied bandage.

## 2012-02-14 NOTE — ED Notes (Signed)
Pt had sutures placed at this location on 02/04/2012, was told to return for removal evaluation in ten days.  Reports no redness or drainage but pain at the site which she rates 3 out of 10.  Entry by: Ali Lowe, RN

## 2012-02-14 NOTE — ED Provider Notes (Signed)
Medical screening examination/treatment/procedure(s) were performed by non-physician practitioner and as supervising physician I was immediately available for consultation/collaboration.  Leslee Home, M.D.   Reuben Likes, MD 02/14/12 2014

## 2012-04-04 ENCOUNTER — Telehealth: Payer: Self-pay | Admitting: Oncology

## 2012-04-04 NOTE — Telephone Encounter (Signed)
Returned pt's call re cx 10/10 appt. Per pt she is currently being monitored by another doctor and does not need to keep this appt. Message to desk nurse.

## 2012-04-05 ENCOUNTER — Ambulatory Visit: Payer: BC Managed Care – PPO | Admitting: Oncology

## 2012-04-05 ENCOUNTER — Other Ambulatory Visit: Payer: BC Managed Care – PPO

## 2012-08-09 ENCOUNTER — Encounter: Payer: Self-pay | Admitting: Obstetrics and Gynecology

## 2012-08-28 ENCOUNTER — Encounter: Payer: Self-pay | Admitting: Obstetrics and Gynecology

## 2012-10-24 ENCOUNTER — Encounter: Payer: Self-pay | Admitting: Obstetrics and Gynecology

## 2012-10-25 DIAGNOSIS — Z90710 Acquired absence of both cervix and uterus: Secondary | ICD-10-CM

## 2012-10-25 HISTORY — PX: ROBOTIC ASSISTED LAPAROSCOPIC HYSTERECTOMY AND SALPINGECTOMY: SHX6379

## 2012-10-25 HISTORY — DX: Acquired absence of both cervix and uterus: Z90.710

## 2012-10-26 ENCOUNTER — Encounter: Payer: Self-pay | Admitting: Obstetrics and Gynecology

## 2012-10-26 ENCOUNTER — Ambulatory Visit: Payer: Self-pay | Admitting: Obstetrics and Gynecology

## 2012-10-29 DIAGNOSIS — Z01419 Encounter for gynecological examination (general) (routine) without abnormal findings: Secondary | ICD-10-CM

## 2012-11-11 DIAGNOSIS — I10 Essential (primary) hypertension: Secondary | ICD-10-CM | POA: Insufficient documentation

## 2012-11-11 DIAGNOSIS — E785 Hyperlipidemia, unspecified: Secondary | ICD-10-CM | POA: Insufficient documentation

## 2012-11-13 DIAGNOSIS — Z9889 Other specified postprocedural states: Secondary | ICD-10-CM | POA: Insufficient documentation

## 2012-12-11 ENCOUNTER — Telehealth: Payer: Self-pay | Admitting: Obstetrics and Gynecology

## 2012-12-11 NOTE — Telephone Encounter (Signed)
Patient has tried to locate phone # of Doctor Siedhoff @ [facility]. States was referred there by Dr. Tresa Res . Dr. Viann Fish did hysterectomy on patien 11/12/2012. She is currently having vaginal bleeding and cramping and pain. States she has called a Engineer, civil (consulting) phone # and left messages and no one has called her back. Was told the doctor and his nurse is out of office. Message of this given to Dr. Farrel Gobble who states will see patient here in our office in the morning @ 9:15am. Patient notified of this and states will be here and is appreciative of this.

## 2012-12-11 NOTE — Telephone Encounter (Signed)
Patient is concern with cramping and dark bleeding . Has been 4 weeks since surgery. Cant locate the number to the dr. That did the surgery. Please call with that information.

## 2012-12-11 NOTE — Telephone Encounter (Signed)
Call back to patient.  She states she has now heard back from Dr Viann Fish who agrees with appt with [place] tomm.  He advised patietn that she has sutures in vagina and thinks she may have done to much taking care of her parents who are in town.  Advised that if she begins heavy bleeding or increased pain, go to [facility] ED and patient states she would if needed.  Also instructed to get off her feet, take Motrin and use heating pad on low but not to sleep with it.  While talking with patient, she is interrupted to help with parents.  Again, encouraged to decrease activity.     Msg routed to Dr Edward Jolly (on call) and Dr Farrel Gobble for am appt

## 2012-12-12 ENCOUNTER — Ambulatory Visit (INDEPENDENT_AMBULATORY_CARE_PROVIDER_SITE_OTHER): Payer: BC Managed Care – PPO | Admitting: Gynecology

## 2012-12-12 ENCOUNTER — Encounter: Payer: Self-pay | Admitting: Gynecology

## 2012-12-12 VITALS — BP 102/68 | HR 70 | Resp 16 | Wt 126.0 lb

## 2012-12-12 DIAGNOSIS — T888XXA Other specified complications of surgical and medical care, not elsewhere classified, initial encounter: Secondary | ICD-10-CM

## 2012-12-12 DIAGNOSIS — K5909 Other constipation: Secondary | ICD-10-CM

## 2012-12-12 DIAGNOSIS — R300 Dysuria: Secondary | ICD-10-CM

## 2012-12-12 DIAGNOSIS — IMO0002 Reserved for concepts with insufficient information to code with codable children: Secondary | ICD-10-CM

## 2012-12-12 LAB — POCT URINALYSIS DIPSTICK
Bilirubin, UA: NEGATIVE
Blood, UA: NEGATIVE
Glucose, UA: NEGATIVE
Ketones, UA: NEGATIVE
Leukocytes, UA: NEGATIVE
Nitrite, UA: NEGATIVE
Protein, UA: NEGATIVE
Urobilinogen, UA: NEGATIVE
pH, UA: 7

## 2012-12-12 NOTE — Progress Notes (Signed)
Subjective:     Patient ID: Shari Lawrence, female   DOB: March 01, 1961, 52 y.o.   MRN: [id number]  HPI Comments: Pt presents here 4w after having laparoscopic hysterectomy for fibroids 5/19.  Pt has not been seen by her operating surgeon since OR. Pt reports that she has been bleeding since OR, not enough to fill a pad, she feels nauseated.  Pt reports bleeding is both red and brown, mostly red, no clots.  Pt has not been sexually active, Pt has been the primary care giver of parents who both live with er.  She has had nurses in the house during the day.  She denies pulling and lifting parents.  She does go up and down steps frequently.  She has tried to not lift over 20#.  Pt using only motrin for pain-800mg  twice daily.  Pt has increased use recently.  No fever, chills, dysuria but has some bladder spasms.  Pt is not postmenopausal.  Pt reports pressure in lower abdomen for 2w, unchanged.  Bowels still constipated-using colace once a day. Pt has had only 3 bowel movements since OR, typically daily. operative notes from [facility] reviewed with pt   Review of Systems Per HPI    Objective:   Physical Exam  Constitutional: She is oriented to person, place, and time. She appears well-developed and well-nourished.  Abdominal: Soft. She exhibits distension. She exhibits no mass. There is tenderness. There is no rebound and no guarding.  Genitourinary: There is no rash on the right labia. There is no rash on the left labia. There is bleeding (scant, cuff supported, no active bleeding) around the vagina.  Neurological: She is alert and oriented to person, place, and time.       Assessment:     Post-op bleeding Post-op constipation  Dysuria     Plan:     Discussed importance of stool softners Avoid excess lifting Check urine culture Length of time reviewing records and post-op course with pt 82m

## 2012-12-12 NOTE — Patient Instructions (Signed)
Constipation-need to have daily bowel movements- miralax daily, ducloax  or glyerine suppository, increase colace to 3x/d. simtheicone for gas-gas-x Increase fluids Avoid lifting

## 2012-12-12 NOTE — Addendum Note (Signed)
Addended by: Ernest Haber on: 12/12/2012 10:41 AM   Modules accepted: Orders

## 2012-12-13 LAB — URINE CULTURE
Colony Count: NO GROWTH
Organism ID, Bacteria: NO GROWTH

## 2013-01-01 ENCOUNTER — Ambulatory Visit (INDEPENDENT_AMBULATORY_CARE_PROVIDER_SITE_OTHER): Payer: BC Managed Care – PPO | Admitting: Medical

## 2013-01-01 ENCOUNTER — Encounter: Payer: Self-pay | Admitting: Medical

## 2013-01-01 VITALS — BP 100/80 | HR 58 | Temp 98.1°F | Resp 16 | Ht 65.0 in | Wt 122.0 lb

## 2013-01-01 DIAGNOSIS — R7301 Impaired fasting glucose: Secondary | ICD-10-CM

## 2013-01-01 DIAGNOSIS — M722 Plantar fascial fibromatosis: Secondary | ICD-10-CM

## 2013-01-01 DIAGNOSIS — E785 Hyperlipidemia, unspecified: Secondary | ICD-10-CM

## 2013-01-01 DIAGNOSIS — D649 Anemia, unspecified: Secondary | ICD-10-CM

## 2013-01-01 DIAGNOSIS — Z0000 Encounter for general adult medical examination without abnormal findings: Secondary | ICD-10-CM

## 2013-01-01 DIAGNOSIS — F430 Acute stress reaction: Secondary | ICD-10-CM

## 2013-01-01 LAB — POCT URINALYSIS DIPSTICK
Bilirubin, UA: NEGATIVE
Glucose, UA: NEGATIVE
Ketones, UA: NEGATIVE
Leukocytes, UA: NEGATIVE
Nitrite, UA: NEGATIVE
Protein, UA: NEGATIVE
Spec Grav, UA: 1.01
Urobilinogen, UA: NEGATIVE
pH, UA: 6

## 2013-01-01 LAB — LIPID PANEL
Cholesterol: 245 mg/dL — ABNORMAL HIGH (ref 0–200)
HDL: 63 mg/dL (ref >39)
LDL Cholesterol: 162 mg/dL — ABNORMAL HIGH (ref 0–99)
Total CHOL/HDL Ratio: 3.9 Ratio
Triglycerides: 99 mg/dL (ref <150)
VLDL: 20 mg/dL (ref 0–40)

## 2013-01-01 LAB — COMPREHENSIVE METABOLIC PANEL
ALT: 17 U/L (ref 0–35)
AST: 17 U/L (ref 0–37)
Albumin: 4.4 g/dL (ref 3.5–5.2)
Alkaline Phosphatase: 83 U/L (ref 39–117)
BUN: 9 mg/dL (ref 6–23)
CO2: 27 mEq/L (ref 19–32)
Calcium: 9.8 mg/dL (ref 8.4–10.5)
Chloride: 104 mEq/L (ref 96–112)
Creat: 0.9 mg/dL (ref 0.50–1.10)
Glucose, Bld: 80 mg/dL (ref 70–99)
Potassium: 4.1 mEq/L (ref 3.5–5.3)
Sodium: 141 mEq/L (ref 135–145)
Total Bilirubin: 0.3 mg/dL (ref 0.3–1.2)
Total Protein: 7.2 g/dL (ref 6.0–8.3)

## 2013-01-01 NOTE — Progress Notes (Signed)
Subjective:   HPI  Shari Lawrence is a 52 y.o. female who presents for a complete physical.  Medical care team includes:  Hematology, Dr. Gaylyn Rong  Gastroenterology, Dr. Loreta Ave  Dentist, Dr. Providence Lanius  Gynecology, Dr. Meredeth Ide, [facility]  Primary Care - Kristian Covey, PA-C   Preventative care: Last ophthalmology visit:not recently, using some reading glasses  Last dental visit:yes Dr. Providence Lanius Last colonoscopy:08/2010 Dr. Loreta Ave Last mammogram:02/2013 Last gynecological exam:11/2012, [facility] - Dr. Meredeth Ide Last EKG:n/a Last labs:2013  Prior vaccinations: TD or Tdap:01/2012 Influenza:n/a Pneumococcal:n/a Shingles/Zostavax:n/a  Advanced directive:n/a Health care power of attorney:n/a Living will:n/a  Concerns: Anemia - wants labs rechecked.  Now that she has had hysterectomy in may with only minimal spotting now, post op, and now after being taken off iron, needs repeat CBC.  Wants lipid recheck.  Hx/o elevated lipids.  She is eating healthy, exercising, nonsmoker.     HTN - compliant with medication and no c/o.  Does have some recent burning and aches in bottom of feet, worse when she goes to get out of bed in the mornings.  No prior similar . No recent trauma or injury.   Under increased stress.  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   This is very stressful.  Has nurse coming in to help with mom several hours daily, dad doesn't qualify for nurse.   She is having to fund aids out of pocket.  Mom has dementia.   Markiya has no free time given the increased burdens.   mood has been down.   Reviewed their medical, surgical, family, social, medication, and allergy history and updated chart as appropriate.   Past Medical History  Diagnosis Date  . Anxiety   . GERD (gastroesophageal reflux disease)   . Migraine   . Fibromyalgia   . Uterine fibroid   . Internal hemorrhoids   . Gastric polyp   . Abnormal Pap smear 1988    treated with  cryo  . Hyperlipemia   . Anemia 11/2010    hematology consult prior; etiology malabsorption and uterine bleeding  . H/O hysterectomy for benign disease 5/14  . HTN (hypertension) 03/2010    hospitalization for HTN urgency  . History of echocardiogram 03/2010    normal LV function, EF 60-65%, mild left atrial enlargement  . H/O bone density study 12/2007  . Spondylosis, cervical   . Lumbar degenerative disc disease     Past Surgical History  Procedure Laterality Date  . Cholecystectomy  2003  . Myomectomy  1998  . Cesarean section    . Exploratory laparotomy    . Cervical fusion  2010  . Colonoscopy  08/2010    Dr. Loreta Ave  . Esophagogastroduodenoscopy  08/2010    Dr. Loreta Ave  . Cervix lesion destruction  1988  . Pelvic laparoscopy    . Abdominal hysterectomy  10/2012    [facility]; laproscopic due to fibroids    Family History  Problem Relation Age of Onset  . Hypertension Mother   . Dementia Mother   . Colon cancer Mother 55  . Deep vein thrombosis Father   . Hypertension Father   . Cancer Father     prostate CA  . Heart disease Father 84    CAD  . Hypertension Sister   . Hyperlipidemia Sister   . Hypertension Brother   . Hyperlipidemia Brother   . Hypertension Maternal Aunt   . Hyperlipidemia Maternal Aunt   .  Rheum arthritis Maternal Aunt   . Diabetes Maternal Aunt     1 with type II, 1 with type 1  . Diabetes Paternal Aunt     type II  . Hypertension Brother     History   Social History  . Marital Status: Divorced    Spouse Name: N/A    Number of Children: 1  . Years of Education: N/A   Occupational History  .      molecular Production manager, PhD candidate, [employer]   Social History Main Topics  . Smoking status: Never Smoker   . Smokeless tobacco: Never Used  . Alcohol Use: No  . Drug Use: No  . Sexually Active: No   Other Topics Concern  . 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 amLODipine (NORVASC) 5 MG tablet TAKE 1 TABLET BY MOUTH EVERY DAY  90 tablet  2  . Ascorbic Acid (VITAMIN C) 1000 MG tablet Take 1,000 mg by mouth daily.        . calcium carbonate (OS-CAL) 600 MG TABS Take 600 mg by mouth daily.        Marland Kitchen dexlansoprazole (DEXILANT) 60 MG capsule Take 60 mg by mouth daily.        . Multiple Vitamin (MULTIVITAMIN) tablet Take 1 tablet by mouth daily.        . Probiotic Product (PROBIOTIC ACIDOPHILUS PO) Take 1 tablet by mouth daily.        . vitamin E 100 UNIT capsule Take 100 Units by mouth daily.        . Cholecalciferol (CVS VIT D 5000 HIGH-POTENCY) 5000 UNITS capsule Take 2,000 Units by mouth daily.       . fish oil-omega-3 fatty acids 1000 MG capsule Take 2 g by mouth daily.      Marland Kitchen HYDROmorphone (DILAUDID) 2 MG tablet       . magnesium 30 MG tablet Take 30 mg by mouth daily.        . megestrol (MEGACE) 20 MG tablet Take 20 mg by mouth 2 (two) times daily.       No current facility-administered medications on file prior to visit.    Allergies  Allergen Reactions  . Gluten Meal   . Latex   . Percocet (Oxycodone-Acetaminophen)   . Quinolones   . Sulfa Drugs Cross Reactors   . Vicodin (Hydrocodone-Acetaminophen)       Review of Systems Constitutional: -fever, -chills, -sweats, -unexpected weight change, -decreased appetite, +fatigue Allergy: -sneezing, -itching, -congestion Dermatology: -changing moles, --rash, -lumps ENT: -runny nose, -ear pain, -sore throat, -hoarseness, -sinus pain, -teeth pain, - ringing in ears, -hearing loss, -nosebleeds Cardiology: -chest pain, -palpitations, -swelling, -difficulty breathing when lying flat, -waking up short of breath Respiratory: -cough, -shortness of breath, -difficulty breathing with  exercise or exertion, -wheezing, -coughing up blood Gastroenterology: -abdominal pain, -nausea, -vomiting, -diarrhea, -constipation, -blood in stool, -changes in bowel movement, -difficulty swallowing or eating Hematology: -bleeding, -bruising  Musculoskeletal: -joint aches, -muscle aches, -joint swelling, +back pain, -neck pain, -cramping, -changes in gait Ophthalmology: denies vision changes, eye redness, itching, discharge Urology: -burning with urination, -difficulty urinating, -blood in urine, -urinary frequency, -urgency, -incontinence Neurology: -headache, -weakness, +tingling, -numbness, -  memory loss, -falls, -dizziness Psychology: +depressed mood, -agitation, -sleep problems     Objective:   Physical Exam  Filed Vitals:   01/01/13 0952  BP: 100/80  Pulse: 58  Temp: 98.1 F (36.7 C)    General appearance: alert, no distress, WD/WN, lean AA female Skin: right mid anterior forearm scar, otherwise no worrisome lesions HEENT: normocephalic, conjunctiva/corneas normal, sclerae anicteric, PERRLA, EOMi, nares patent, no discharge or erythema, pharynx normal Oral cavity: MMM, tongue normal, teeth in good repair Neck: supple, no lymphadenopathy, no thyromegaly, no masses, normal ROM, no bruits Chest: non tender, normal shape and expansion Heart: RRR, normal S1, S2, no murmurs Lungs: CTA bilaterally, no wheezes, rhonchi, or rales Abdomen: +bs, soft, non tender, non distended, no masses, no hepatomegaly, no splenomegaly, no bruits Back: non tender, normal ROM, no scoliosis Musculoskeletal: tender plantar fascia bilat feet, otherwise upper extremities non tender, no obvious deformity, normal ROM throughout, lower extremities non tender, no obvious deformity, normal ROM throughout Extremities: no edema, no cyanosis, no clubbing Pulses: 2+ symmetric, upper and lower extremities, normal cap refill Neurological: alert, oriented x 3, CN2-12 intact, strength normal upper extremities and lower  extremities, sensation normal throughout, DTRs 2+ throughout, no cerebellar signs, gait normal Psychiatric: normal affect, behavior normal, pleasant  Breast/gyn/rectal - deferred to gynecology   Assessment and Plan :    Encounter Diagnoses  Name Primary?  . Routine general medical examination at a health care facility Yes  . Impaired fasting blood sugar   . Hyperlipidemia   . Anemia   . Acute stress reaction   . Plantar fasciitis     Physical exam - discussed healthy lifestyle, diet, exercise, preventative care, vaccinations, and addressed their concerns.  Handout given. Labs today for impaired glucose.   She is eating healthy, exercising.  Cardiac risk factors - HTN, family history of premature heart disease in father, borderline diabetes.   Lipid panel today.   Plantar fascitis -we will call back with recommendations as we didn't get a chance to discuss given the other concerns.   Acute stress - discussed her situation, trying to get her siblings to do more to help take care of her parents and giving her some time for her self.   Discussed other ways to deal with stress, decreased mood, she declines counseling or medication at this time.   Updated her medical history/chart today.  Follow-up pending labs

## 2013-01-02 LAB — HEMOGLOBIN A1C
Hgb A1c MFr Bld: 5.8 % — ABNORMAL HIGH (ref <5.7)
Mean Plasma Glucose: 120 mg/dL — ABNORMAL HIGH (ref <117)

## 2013-01-02 LAB — CBC WITH DIFFERENTIAL/PLATELET
Basophils Absolute: 0.1 10*3/uL (ref 0.0–0.1)
Basophils Relative: 1 % (ref 0–1)
Eosinophils Absolute: 0.4 10*3/uL (ref 0.0–0.7)
Eosinophils Relative: 7 % — ABNORMAL HIGH (ref 0–5)
HCT: 36.7 % (ref 36.0–46.0)
Hemoglobin: 11.8 g/dL — ABNORMAL LOW (ref 12.0–15.0)
Lymphocytes Relative: 32 % (ref 12–46)
Lymphs Abs: 1.8 10*3/uL (ref 0.7–4.0)
MCH: 28 pg (ref 26.0–34.0)
MCHC: 32.2 g/dL (ref 30.0–36.0)
MCV: 87.2 fL (ref 78.0–100.0)
Monocytes Absolute: 0.4 10*3/uL (ref 0.1–1.0)
Monocytes Relative: 7 % (ref 3–12)
Neutro Abs: 2.9 10*3/uL (ref 1.7–7.7)
Neutrophils Relative %: 53 % (ref 43–77)
Platelets: 258 10*3/uL (ref 150–400)
RBC: 4.21 MIL/uL (ref 3.87–5.11)
RDW: 15.1 % (ref 11.5–15.5)
WBC: 5.5 10*3/uL (ref 4.0–10.5)

## 2013-01-02 LAB — VITAMIN D 25 HYDROXY (VIT D DEFICIENCY, FRACTURES): Vit D, 25-Hydroxy: 61 ng/mL (ref 30–89)

## 2013-01-03 ENCOUNTER — Other Ambulatory Visit: Payer: Self-pay | Admitting: Medical

## 2013-01-03 MED ORDER — ATORVASTATIN CALCIUM 20 MG PO TABS: 20.0000 mg | ORAL_TABLET | Freq: Every day | ORAL | Status: DC

## 2013-03-06 ENCOUNTER — Ambulatory Visit (INDEPENDENT_AMBULATORY_CARE_PROVIDER_SITE_OTHER): Payer: BC Managed Care – PPO | Admitting: Gynecology

## 2013-03-06 ENCOUNTER — Encounter: Payer: Self-pay | Admitting: Gynecology

## 2013-03-06 VITALS — BP 134/80 | HR 80 | Resp 12 | Ht 64.0 in | Wt 120.0 lb

## 2013-03-06 DIAGNOSIS — D649 Anemia, unspecified: Secondary | ICD-10-CM

## 2013-03-06 DIAGNOSIS — N951 Menopausal and female climacteric states: Secondary | ICD-10-CM

## 2013-03-06 DIAGNOSIS — Z01419 Encounter for gynecological examination (general) (routine) without abnormal findings: Secondary | ICD-10-CM

## 2013-03-06 LAB — CBC
HCT: 36.3 % (ref 36.0–46.0)
Hemoglobin: 12.3 g/dL (ref 12.0–15.0)
MCH: 29.4 pg (ref 26.0–34.0)
MCHC: 33.9 g/dL (ref 30.0–36.0)
MCV: 86.8 fL (ref 78.0–100.0)
Platelets: 172 10*3/uL (ref 150–400)
RBC: 4.18 MIL/uL (ref 3.87–5.11)
RDW: 15.9 % — ABNORMAL HIGH (ref 11.5–15.5)
WBC: 4.4 10*3/uL (ref 4.0–10.5)

## 2013-03-06 MED ORDER — ESTRADIOL 0.1 MG/24HR TD PTTW: 1.0000 | MEDICATED_PATCH | TRANSDERMAL | Status: DC

## 2013-03-06 MED ORDER — FUSION PLUS PO CAPS: 1.0000 | ORAL_CAPSULE | ORAL | Status: DC

## 2013-03-06 NOTE — Progress Notes (Signed)
52 y.o. Divorced [place] American female   G1P1 here for annual exam. Pt reports menses are absent.  She does report hot flashes, does have night sweats, does not have vaginal dryness.  She is not using lubricants..  She does not report post-menopasual bleeding.  Pt states that since surgery that she still has issues with constipation, a lot of stress taking care of parents.  Pt reports some hot flashes and sweats, insomnia and anxiety.  Patient's last menstrual period was 01/11/2012.          Sexually active: no  The current method of family planning is status post hysterectomy.    Exercising: yes  walking, stretching almost qd Last pap: 08/24/2009 Abnormal PAP: once Mammogram: 09/09/11 BSE: not like she should Colonoscopy: 2010 DEXA: 10 years ago Alcohol: no Tobacco: no  Hgb: would like to have checked ; Urine: PCP  Health Maintenance  Topic Date Due  . Pap Smear  08/24/2010  . Mammogram  10/31/2011  . Influenza Vaccine  01/25/2013  . Colonoscopy  09/23/2020  . Tetanus/tdap  02/03/2022    Family History  Problem Relation Age of Onset  . Hypertension Mother   . Dementia Mother   . Colon cancer Mother 54  . Deep vein thrombosis Father   . Hypertension Father   . Cancer Father     prostate CA  . Heart disease Father 5    CAD  . Hypertension Sister   . Hyperlipidemia Sister   . Hypertension Brother   . Hyperlipidemia Brother   . Hypertension Maternal Aunt   . Hyperlipidemia Maternal Aunt   . Rheum arthritis Maternal Aunt   . Diabetes Maternal Aunt     1 with type II, 1 with type 1  . Diabetes Paternal Aunt     type II  . Hypertension Brother     Patient Active Problem List   Diagnosis Date Noted  . Impaired fasting glucose 10/25/2011  . Uterine fibroid 10/25/2011  . Gluten intolerance 10/25/2011  . GERD (gastroesophageal reflux disease) 10/25/2011  . Essential hypertension, benign 10/25/2011  . HTN (hypertension)   . Anxiety   . Anemia     Past Medical  History  Diagnosis Date  . Anxiety   . GERD (gastroesophageal reflux disease)   . Migraine   . Fibromyalgia   . Uterine fibroid   . Internal hemorrhoids   . Gastric polyp   . Abnormal Pap smear 1988    treated with cryo  . Hyperlipemia   . Anemia 11/2010    hematology consult prior; etiology malabsorption and uterine bleeding  . H/O hysterectomy for benign disease 5/14  . HTN (hypertension) 03/2010    hospitalization for HTN urgency  . History of echocardiogram 03/2010    normal LV function, EF 60-65%, mild left atrial enlargement  . H/O bone density study 12/2007  . Spondylosis, cervical   . Lumbar degenerative disc disease     Past Surgical History  Procedure Laterality Date  . Cholecystectomy  2003  . Myomectomy  1998  . Cesarean section    . Exploratory laparotomy    . Cervical fusion  2010  . Colonoscopy  08/2010    Dr. Loreta Ave  . Esophagogastroduodenoscopy  08/2010    Dr. Loreta Ave  . Cervix lesion destruction  1988  . Pelvic laparoscopy    . Abdominal hysterectomy  10/2012    [facility]; laproscopic due to fibroids    Allergies: Gluten meal; Latex; Percocet; Quinolones; Sulfa drugs cross reactors;  and Vicodin  Current Outpatient Prescriptions  Medication Sig Dispense Refill  . amLODipine (NORVASC) 5 MG tablet TAKE 1 TABLET BY MOUTH EVERY DAY  90 tablet  2  . Ascorbic Acid (VITAMIN C) 1000 MG tablet Take 1,000 mg by mouth daily.        Marland Kitchen atorvastatin (LIPITOR) 20 MG tablet Take 1 tablet (20 mg total) by mouth daily.  90 tablet  3  . b complex vitamins tablet Take 1 tablet by mouth daily.      . calcium carbonate (OS-CAL) 600 MG TABS Take 600 mg by mouth daily.        . Cholecalciferol (CVS VIT D 5000 HIGH-POTENCY) 5000 UNITS capsule Take 2,000 Units by mouth daily.       Marland Kitchen dexlansoprazole (DEXILANT) 60 MG capsule Take 60 mg by mouth daily.        . fish oil-omega-3 fatty acids 1000 MG capsule Take 2 g by mouth daily.      . Multiple Vitamin (MULTIVITAMIN) tablet Take 1 tablet  by mouth daily.        . Probiotic Product (PROBIOTIC ACIDOPHILUS PO) Take 1 tablet by mouth daily.        . magnesium 30 MG tablet Take 30 mg by mouth daily.        . vitamin E 100 UNIT capsule Take 100 Units by mouth daily.         No current facility-administered medications for this visit.    ROS: Pertinent items are noted in HPI.  Exam:    BP 134/80  Pulse 80  Resp 12  Ht 5\' 4"  (1.626 m)  Wt 120 lb (54.432 kg)  BMI 20.59 kg/m2  LMP 01/11/2012 Weight change: @WEIGHTCHANGE @ Last 3 height recordings:  Ht Readings from Last 3 Encounters:  03/06/13 5\' 4"  (1.626 m)  01/01/13 5\' 5"  (1.651 m)  07/07/11 5\' 4"  (1.626 m)   General appearance: alert, cooperative and appears stated age Head: Normocephalic, without obvious abnormality, atraumatic Neck: no adenopathy, no carotid bruit, no JVD, supple, symmetrical, trachea midline and thyroid not enlarged, symmetric, no tenderness/mass/nodules Lungs: clear to auscultation bilaterally Breasts: normal appearance, no masses or tenderness Heart: regular rate and rhythm, S1, S2 normal, no murmur, click, rub or gallop Abdomen: soft, non-tender; bowel sounds normal; no masses,  no organomegaly Extremities: extremities normal, atraumatic, no cyanosis or edema Skin: Skin color, texture, turgor normal. No rashes or lesions Lymph nodes: Cervical, supraclavicular, and axillary nodes normal. no inguinal nodes palpated Neurologic: Grossly normal   Pelvic: External genitalia:  no lesions              Urethra: normal appearing urethra with no masses, tenderness or lesions              Bartholins and Skenes: normal                 Vagina: normal appearing vagina with normal color and discharge, no lesions              Cervix: absent              Pap taken: no        Bimanual Exam:  Uterus:  absent                                      Adnexa:    normal adnexa in size, nontender and no masses, right  ovary scarred to cuff                                       Rectovaginal: Confirms                                      Anus:  normal sphincter tone, no lesions  A: well woman no contraindication to begin hormonal therapy      P: mammogram annual, overdue counseled on use and side effects of HRT,  will try estrogen replacement-possible symptoms related to hypoestogenic state pt not interested in antidepressant Anemia-f/u cbc today, recommend increasing stool softner while on iron return annually or prn Discussed PAP guideline changes, importance of weight bearing exercises, calcium, vit D and balanced diet.  An After Visit Summary was printed and given to the patient.

## 2013-03-11 ENCOUNTER — Telehealth: Payer: Self-pay | Admitting: *Deleted

## 2013-03-11 NOTE — Telephone Encounter (Signed)
Message copied by Lorraine Lax on Mon Mar 11, 2013 10:04 AM ------      Message from: Douglass Rivers      Created: Fri Mar 08, 2013  5:35 PM       Blood count getting closer to her pre-op level, can try the fusion plus and use once a day, may help with the constipation ------

## 2013-03-11 NOTE — Telephone Encounter (Signed)
Left Message To Call Back  

## 2013-03-13 NOTE — Telephone Encounter (Signed)
Patient notified see labs 

## 2013-04-04 ENCOUNTER — Telehealth: Payer: Self-pay | Admitting: Gynecology

## 2013-04-04 ENCOUNTER — Other Ambulatory Visit: Payer: Self-pay | Admitting: Gynecology

## 2013-04-04 DIAGNOSIS — Z1231 Encounter for screening mammogram for malignant neoplasm of breast: Secondary | ICD-10-CM

## 2013-04-04 NOTE — Telephone Encounter (Signed)
Pt can't afford the hormonal patches and wondering if something else can be sent to pharmacy.

## 2013-04-11 NOTE — Telephone Encounter (Signed)
Dr. Farrel Gobble, can you review and advise if another medicatino can be sent instead of the Vivelle Dot?

## 2013-04-13 NOTE — Telephone Encounter (Signed)
Oral estrogen might be cheaper but not sure, if she wants to try can call in estrace 0.5mg  #30 rf 1 so we can assess how she is doing on it after 31m, if good will refill for yr

## 2013-04-15 MED ORDER — ESTRADIOL 0.5 MG PO TABS: 0.5000 mg | ORAL_TABLET | Freq: Every day | ORAL | Status: DC

## 2013-04-15 NOTE — Telephone Encounter (Signed)
Spoke with patient. She is agreeable to trying oral estrace. Order placed and she will call back in two months to see how she is doing.

## 2013-04-16 ENCOUNTER — Ambulatory Visit (HOSPITAL_COMMUNITY)
Admission: RE | Admit: 2013-04-16 | Discharge: 2013-04-16 | Disposition: A | Discharge status: Home / Self Care | Payer: BC Managed Care – PPO | Source: Ambulatory Visit | Attending: Gynecology | Admitting: Gynecology

## 2013-04-16 DIAGNOSIS — Z1231 Encounter for screening mammogram for malignant neoplasm of breast: Secondary | ICD-10-CM | POA: Insufficient documentation

## 2013-04-22 ENCOUNTER — Other Ambulatory Visit: Payer: Self-pay | Admitting: Medical

## 2013-04-30 ENCOUNTER — Encounter: Payer: Self-pay | Admitting: Medical

## 2013-04-30 ENCOUNTER — Ambulatory Visit (INDEPENDENT_AMBULATORY_CARE_PROVIDER_SITE_OTHER): Payer: BC Managed Care – PPO | Admitting: Medical

## 2013-04-30 VITALS — BP 110/68 | HR 56 | Temp 97.7°F | Resp 16 | Wt 120.0 lb

## 2013-04-30 DIAGNOSIS — Z23 Encounter for immunization: Secondary | ICD-10-CM

## 2013-04-30 DIAGNOSIS — K429 Umbilical hernia without obstruction or gangrene: Secondary | ICD-10-CM

## 2013-04-30 DIAGNOSIS — F411 Generalized anxiety disorder: Secondary | ICD-10-CM

## 2013-04-30 DIAGNOSIS — G4700 Insomnia, unspecified: Secondary | ICD-10-CM

## 2013-04-30 MED ORDER — ALPRAZOLAM 0.5 MG PO TABS: ORAL_TABLET | ORAL | Status: DC

## 2013-04-30 NOTE — Progress Notes (Signed)
Subjective:  Shari Lawrence is a 52 y.o. female who presents for hernia.   She had a laparoscopic partial hysterectomy in May, and over the last several weeks been having bulge and tenderness at the umbilicus where one of the surgical incisions was done.  Since the surgery in May has been using Colace BID for stool softener.  Has daily BM.  She is taking iron for chronic anemia.   If she doesn't take the colace, BMs are not daily, harder, and gets constipation.  She is good with fiber intake, but not so good on water intake.  Currently she notes bulging of the umbilicus with standing, singing, straining, lifting and sit ups.   When flat it isn't as noticeable.  It does cause discomfort with singing.   She is worried that it is worsening.  She sings at church.    She wants a sleep aid.   Earlier in the year she had to move her elderly parents in with her and her other siblings haven't been good about helping with their care.  She has a lot of anxiety with this.  Has trouble falling asleep.  Otherwise practices good sleep hygiene.  She can stay asleep once she gets to sleep, but having trouble getting to sleep initially.  In the past has used Ambien but it was way too strong for her.  Has used xanax prior that worked well.   Has been on antidepressants before, but didn't do well with those.  She denies depressed mood.    She would like a flu shot today.    No other c/o.  The following portions of the patient's history were reviewed and updated as appropriate: allergies, current medications, past family history, past medical history, past social history, past surgical history and problem list.  ROS Otherwise as in subjective above  Objective: Physical Exam  BP 110/68  Pulse 56  Temp(Src) 97.7 F (36.5 C) (Oral)  Resp 16  Wt 120 lb (54.432 kg)  LMP 01/11/2012  General appearance: alert, no distress, WD/WN Abdomen: +bs, soft, 3cm hernia at the umbilicus, reducible, not particularly tender on  exam, worse standing position, otherwise non tender, non distended, no masses, no hepatomegaly, no splenomegaly Neuro: nonfocal exam Ext: no edema Psych: pleasant, good eye contact   Assessment: Encounter Diagnoses  Name Primary?  . Umbilical hernia Yes  . Insomnia   . Anxiety state, unspecified   . Need for prophylactic vaccination and inoculation against influenza     Plan: Umbilical hernia - script for hernia belt, avoid activities that increase intraabdominal pressure as discussed, we will refer to general surgery Insomnia - discussed sleep hygiene, reduce stress when possible, begin prn Xanax at bedtime, not daily.   Anxiety - discussed her concerns, trying to get her siblings to help more with taking care of her elderly parents and helping share the load Counseled on the influenza virus vaccine.  Vaccine information sheet given.  Influenza vaccine given after consent obtained. Follow up: pending referral

## 2013-05-01 ENCOUNTER — Telehealth: Payer: Self-pay | Admitting: Medical

## 2013-05-01 DIAGNOSIS — Z23 Encounter for immunization: Secondary | ICD-10-CM

## 2013-05-01 NOTE — Telephone Encounter (Signed)
Refer to general surgery for umbilical hernia

## 2013-05-01 NOTE — Telephone Encounter (Signed)
I fax everything over to [facility] and they will contact her about her appointment. CLKS

## 2013-05-10 ENCOUNTER — Ambulatory Visit (INDEPENDENT_AMBULATORY_CARE_PROVIDER_SITE_OTHER): Payer: BC Managed Care – PPO | Admitting: General Surgery

## 2013-06-04 ENCOUNTER — Other Ambulatory Visit: Payer: Self-pay | Admitting: Medical

## 2013-06-13 ENCOUNTER — Encounter (INDEPENDENT_AMBULATORY_CARE_PROVIDER_SITE_OTHER): Payer: Self-pay | Admitting: General Surgery

## 2013-06-13 ENCOUNTER — Ambulatory Visit (INDEPENDENT_AMBULATORY_CARE_PROVIDER_SITE_OTHER): Payer: BC Managed Care – PPO | Admitting: General Surgery

## 2013-06-13 ENCOUNTER — Encounter (INDEPENDENT_AMBULATORY_CARE_PROVIDER_SITE_OTHER): Payer: Self-pay

## 2013-06-13 VITALS — BP 110/82 | HR 60 | Temp 97.0°F | Resp 12 | Ht 64.0 in | Wt 119.0 lb

## 2013-06-13 DIAGNOSIS — K432 Incisional hernia without obstruction or gangrene: Secondary | ICD-10-CM

## 2013-06-13 NOTE — Patient Instructions (Signed)
You have an incisional hernia around her umbilicus. This is most likely due to the 3 operations that you have had in that location. The muscle defect appears to be about 3 cm right now, but over time, this will enlarge.  We have talked about all of the different techniques for repairing this hernia with mesh. We talked about all the risks and complications.  We have decided that you will call back when you're ready to schedule the surgery.  The planned surgery will be a laparoscopic incisional hernia repair with mesh, possible open.      Laparoscopic Ventral Hernia Repair Laparoscopic ventral hernia repairis a surgery to fix a ventral hernia. Aventral hernia, also called an incisional hernia, is a bulge of body tissue or intestines that pushes through the front part of the abdomen. This can happen if the connective tissue covering the muscles over the abdomen has a weak spot or is torn because of a surgical cut (incision) from a previous surgery. Laparoscopic ventral hernia repair is often done soon after diagnosis to stop the hernia from getting bigger, becoming uncomfortable, or becoming an emergency. This surgery usually takes about 2 hours, but the time can vary greatly. LET YOUR CAREGIVER KNOW ABOUT:  Any allergies you have.  All medicines you are taking, including steroids, vitamins, herbs, eyedrops, and over-the-counter medicines and creams.  Previous problems you or members of your family have had with the use of anesthetics.  Any blood disorders or bleeding problems you have had.  Past surgeries you have had.  Other health problems you have. RISKS AND COMPLICATIONS  Generally, laparoscopic ventral hernia repair is a safe procedure. However, as with any surgical procedure, complications can occur. Possible complications include:  Bleeding.  Trouble passing urine or having a bowel movement after the operation.  Infection.  Pneumonia.  Blood clots.  Pain in the area of  the hernia.  A bulge in the area of the hernia that may be caused by a collection of fluid.  Injury to intestines or other structures in the abdomen.  Return of the hernia after surgery. In some cases, the caregiver may need to stop the laparoscopic procedure and do regular, open surgery. This may be necessary for very difficult hernias, when organs are hard to see, or when bleeding problems occur during surgery. BEFORE THE PROCEDURE   You may need to have blood tests, urine tests, a chest X-ray, or electrocardiography done before the day of the surgery.  Ask your caregiver about changing or stopping your regular medicines.  You may need to wash with a special type of germ-killing soap.  Do not eat or drink anything for at least 6 hours before the surgery.  Make plans to have someone drive you home after the procedure. PROCEDURE   Small monitors will be put on your body. They are used to check your heart, blood pressure, and oxygen level.  An intravenous (IV) access tube will be put into a vein in your hand or arm. Fluids and medicine will flow directly into your body through the IV tube.  You will be given medicine to make you sleep through the procedure (general anesthetic).  Your abdomen will be cleaned with a special soap to kill any germs on your skin.  Once you are asleep, several small incisions will be made in your abdomen.  The large space in your abdomen will be filled with air so that it expands. This gives the caregiver more room and a better view.  A thin, lighted tube with a tiny camera on the end (laparoscope) is put through a small incision in your abdomen. The camera on the laparoscope sends a picture to a TV screen in the operating room. This gives the caregiver a good view inside the abdomen.  Hollow tubes are put through the other small incisions in your abdomen. The tools needed for the procedure are put through these tubes.  The caregiver puts the tissue or  intestines that formed the hernia back in place.  A screen-like patch (mesh) is used to close the hernia. This helps make the area stronger. Stitches, tacks, or staples are used to keep the mesh in place.  Medicine and a bandage (dressing) or skin glue will be put over the incisions. AFTER THE PROCEDURE   You will stay in a recovery area until the anesthetic wears off. Your blood pressure and pulse will be checked often.  You may be able to go home the same day or may need to stay in the hospital for 1 or 2 days after this surgery. Your caregiver will decide when you can go home.  You may feel some pain. You will likely be given medicine for pain.  You will be urged to do breathing exercises that involve taking deep breaths. This helps prevent a lung infection after a surgery.  You may have to wear compression stockings while you are in the hospital. These stockings help keep blood clots from forming in your legs. Document Released: 05/30/2012 Document Reviewed: 05/30/2012 [facility] Patient Information 2014 [place], [place].

## 2013-06-13 NOTE — Progress Notes (Signed)
Patient ID: Shari Lawrence, female   DOB: [birth year], 52 y.o.   MRN: [id number]  Chief Complaint  Patient presents with  . Umbilical Hernia    HPI Shari Lawrence is a 52 y.o. female.  She is referred by Kristian Covey, PA for evaluation and management of an incisional hernia in the periumbilical area.  The patient has had a laparoscopic myomectomy, laparoscopic cholecystectomy 2004, and earlier this year she had a laparoscopic hysterectomy because of menorrhagia and fibroids. Ovaries are retained. This was done at [facility]. She developed a bulge at her umbilicus in September this year that is painful, especially when she does sit-ups or sings in church. No history of incarceration.She also had a C-section through a Pfannenstiel incision.  Comorbidities reveal that she is generally healthy. Fibromyalgia, hypertension, hyperlipidemia, anxiety, and GERD are mentioned. No prior history of hernia.  She is divorced, has one child, denies tobacco,  works at [employer] in [employer], clinical. HPI  Past Medical History  Diagnosis Date  . Anxiety   . GERD (gastroesophageal reflux disease)   . Migraine   . Fibromyalgia   . Uterine fibroid   . Internal hemorrhoids   . Gastric polyp   . Abnormal Pap smear 1988    treated with cryo  . Hyperlipemia   . Anemia 11/2010    hematology consult prior; etiology malabsorption and uterine bleeding  . H/O hysterectomy for benign disease 5/14  . HTN (hypertension) 03/2010    hospitalization for HTN urgency  . History of echocardiogram 03/2010    normal LV function, EF 60-65%, mild left atrial enlargement  . H/O bone density study 12/2007  . Spondylosis, cervical   . Lumbar degenerative disc disease     Past Surgical History  Procedure Laterality Date  . Cholecystectomy  2003  . Myomectomy  1998  . Cesarean section    . Exploratory laparotomy    . Cervical fusion  2010  . Colonoscopy  08/2010    Dr. Loreta Ave  .  Esophagogastroduodenoscopy  08/2010    Dr. Loreta Ave  . Cervix lesion destruction  1988  . Pelvic laparoscopy    . Robotic assisted laparoscopic hysterectomy and salpingectomy  10/2012    [facility]; laproscopic due to fibroids    Family History  Problem Relation Age of Onset  . Hypertension Mother   . Dementia Mother   . Colon cancer Mother 71  . Deep vein thrombosis Father   . Hypertension Father   . Cancer Father     prostate CA  . Heart disease Father 49    CAD  . Hypertension Sister   . Hyperlipidemia Sister   . Hypertension Brother   . Hyperlipidemia Brother   . Hypertension Maternal Aunt   . Hyperlipidemia Maternal Aunt   . Rheum arthritis Maternal Aunt   . Diabetes Maternal Aunt     1 with type II, 1 with type 1  . Diabetes Paternal Aunt     type II  . Hypertension Brother     Social History History  Substance Use Topics  . Smoking status: Never Smoker   . Smokeless tobacco: Never Used  . Alcohol Use: No    Allergies  Allergen Reactions  . Gluten Meal   . Latex   . Percocet [Oxycodone-Acetaminophen]   . Quinolones   . Sulfa Drugs Cross Reactors   . Vicodin [Hydrocodone-Acetaminophen]     Current Outpatient Prescriptions  Medication Sig Dispense Refill  . ALPRAZolam (  XANAX) 0.5 MG tablet 1/2-1 tablet po for sleep prn  20 tablet  0  . amLODipine (NORVASC) 5 MG tablet Take 5 mg by mouth. Take 5 mg by mouth daily.      Marland Kitchen amLODipine (NORVASC) 5 MG tablet TAKE 1 TABLET BY MOUTH EVERY DAY  90 tablet  2  . Ascorbic Acid (VITAMIN C) 1000 MG tablet Take 1,000 mg by mouth daily.        Marland Kitchen atorvastatin (LIPITOR) 20 MG tablet Take 1 tablet (20 mg total) by mouth daily.  90 tablet  3  . b complex vitamins tablet Take 1 tablet by mouth daily.      . calcium carbonate (OS-CAL) 600 MG TABS Take 600 mg by mouth daily.        . Cholecalciferol ([facility] VIT D 5000 HIGH-POTENCY) 5000 UNITS capsule Take 2,000 Units by mouth daily.       Marland Kitchen dexlansoprazole (DEXILANT) 60 MG capsule Take 60  mg by mouth daily.        Marland Kitchen estradiol (ESTRACE) 0.5 MG tablet Take 1 tablet (0.5 mg total) by mouth daily.  30 tablet  1  . fish oil-omega-3 fatty acids 1000 MG capsule Take 2 g by mouth daily.      . Iron-FA-B Cmp-C-Biot-Probiotic (FUSION PLUS) CAPS Take 1 capsule by mouth 1 day or 1 dose.  60 capsule  4  . magnesium 30 MG tablet Take 30 mg by mouth daily.        . Multiple Vitamin (MULTIVITAMIN) tablet Take 1 tablet by mouth daily.        . Probiotic Product (PROBIOTIC ACIDOPHILUS PO) Take 1 tablet by mouth daily.        . vitamin E 100 UNIT capsule Take 100 Units by mouth daily.         No current facility-administered medications for this visit.    Review of Systems Review of Systems  Constitutional: Negative for fever, chills and unexpected weight change.  HENT: Negative for congestion, hearing loss, sore throat, trouble swallowing and voice change.   Eyes: Negative for visual disturbance.  Respiratory: Negative for cough and wheezing.   Cardiovascular: Negative for chest pain, palpitations and leg swelling.  Gastrointestinal: Positive for abdominal pain, constipation and abdominal distention. Negative for nausea, vomiting, diarrhea, blood in stool and anal bleeding.  Genitourinary: Negative for hematuria, vaginal bleeding and difficulty urinating.  Musculoskeletal: Negative for arthralgias.  Skin: Negative for rash and wound.  Neurological: Negative for seizures, syncope and headaches.  Hematological: Negative for adenopathy. Does not bruise/bleed easily.  Psychiatric/Behavioral: Negative for confusion. The patient is nervous/anxious.     Blood pressure 110/82, pulse 60, temperature 97 F (36.1 C), temperature source Oral, resp. rate 12, height 5\' 4"  (1.626 m), weight 119 lb (53.978 kg), last menstrual period 01/11/2012.  Physical Exam Physical Exam  Constitutional: She is oriented to person, place, and time. She appears well-developed and well-nourished. No distress.  Thin.  Healthy appearing. Small body habitus.  HENT:  Head: Normocephalic and atraumatic.  Nose: Nose normal.  Mouth/Throat: No oropharyngeal exudate.  Eyes: Conjunctivae and EOM are normal. Pupils are equal, round, and reactive to light. Left eye exhibits no discharge. No scleral icterus.  Neck: Neck supple. No JVD present. No tracheal deviation present. No thyromegaly present.  Cardiovascular: Normal rate, regular rhythm, normal heart sounds and intact distal pulses.   No murmur heard. Pulmonary/Chest: Effort normal and breath sounds normal. No respiratory distress. She has no wheezes. She has no  rales. She exhibits no tenderness.  Abdominal: Soft. Bowel sounds are normal. She exhibits no distension and no mass. There is no tenderness. There is no rebound and no guarding.    3 cm reducible hernia get hernia defect in the periumbilical area. Pfannenstiel scar well-healed. Trocar site right flank. Trocar site epigastrium.  Musculoskeletal: She exhibits no edema and no tenderness.  Lymphadenopathy:    She has no cervical adenopathy.  Neurological: She is alert and oriented to person, place, and time. She exhibits normal muscle tone. Coordination normal.  Skin: Skin is warm. No rash noted. She is not diaphoretic. No erythema. No pallor.  Psychiatric: She has a normal mood and affect. Her behavior is normal. Judgment and thought content normal.    Data Reviewed Office notes from referring provider  Assessment    Periumbilical incisional hernia. 3 cm defect. Reducible.  History C-section  History laparoscopic myomectomy  History laparoscopic cholecystectomy,  History laparoscopic hysterectomy for fibroids, 2014   History of multilevel cervical discectomy and fusion  Hypertension  Anxiety  GERD    Plan    I spent a long time with the patient discussing the anatomy and pathophysiology of her incisional hernia. I discussed the natural history and advised her that it would likely  progress. I discussed open repairs with mesh, laparoscopic repairs with mesh. In the end of the discussion she would like to be considered for a laparoscopic incisional hernia with mesh, possible open.  She will call me back when she is ready to do this, probably in January  I discussed the indications, details, techniques, and numerous risks of the surgery with her. She is aware of the risk of bleeding, infection, recurrence, conversion to open laparotomy, injury to adjacent organs such as the intestine with major reconstructive surgery. She is aware that she may have few adhesions or many adhesions which may complicate the procedure. She understands all these issues. At this time all of her questions were answered. She agrees with this plan.        Angelia Mould. Derrell Lolling, M.D., [facility] General and Minimally invasive Surgery Breast and Colorectal Surgery Office:   [phone] Pager:   [phone]  06/13/2013, 12:53 PM

## 2013-06-22 ENCOUNTER — Other Ambulatory Visit: Payer: Self-pay | Admitting: Gynecology

## 2013-06-25 ENCOUNTER — Telehealth (INDEPENDENT_AMBULATORY_CARE_PROVIDER_SITE_OTHER): Payer: Self-pay | Admitting: General Surgery

## 2013-06-25 ENCOUNTER — Other Ambulatory Visit (INDEPENDENT_AMBULATORY_CARE_PROVIDER_SITE_OTHER): Payer: Self-pay | Admitting: General Surgery

## 2013-06-25 NOTE — Telephone Encounter (Signed)
Last Refilled: 04/15/13 #30/1 refill  According to Phone Note 04/04/13 we were going to assess and see how patient was doing, after 2 months and then it could be called in for 1 year.   S/w patient she said the estradiol does work good for her, it took longer than the patch but it does work. Would like rx called in.  Estradiol 0.5 mg #30/11 refills sent through to [facility], patient is aware.

## 2013-06-25 NOTE — Telephone Encounter (Signed)
Left Message To Call Back Re: Update to see how patient is doing on Estradiol, if good can refill x 1 year

## 2013-06-25 NOTE — Telephone Encounter (Signed)
Orders and [facility] posting sheet entered into [employer] tonight.

## 2013-06-25 NOTE — Telephone Encounter (Signed)
Pt wishes to schedule surgery

## 2013-07-04 ENCOUNTER — Telehealth (INDEPENDENT_AMBULATORY_CARE_PROVIDER_SITE_OTHER): Payer: Self-pay | Admitting: General Surgery

## 2013-07-04 ENCOUNTER — Telehealth (INDEPENDENT_AMBULATORY_CARE_PROVIDER_SITE_OTHER): Payer: Self-pay | Admitting: Medical

## 2013-07-04 NOTE — Telephone Encounter (Signed)
Called patient to schedule surgery went over financial responsibilities, patient will call back to schedule

## 2013-08-08 ENCOUNTER — Encounter (INDEPENDENT_AMBULATORY_CARE_PROVIDER_SITE_OTHER): Payer: Self-pay | Admitting: General Surgery

## 2013-08-08 ENCOUNTER — Ambulatory Visit (INDEPENDENT_AMBULATORY_CARE_PROVIDER_SITE_OTHER): Payer: BC Managed Care – PPO | Admitting: General Surgery

## 2013-08-08 VITALS — BP 98/64 | HR 68 | Temp 97.8°F | Resp 18 | Ht 64.0 in | Wt 117.0 lb

## 2013-08-08 DIAGNOSIS — K432 Incisional hernia without obstruction or gangrene: Secondary | ICD-10-CM

## 2013-08-08 NOTE — Patient Instructions (Signed)
We have reexamined your periumbilical incisional hernia today. It is reducible and only minimally larger than it was 2 months ago. You are still a good candidate for a laparoscopic repair.  You will be scheduled for laparoscopic repair of your incisional hernia with mesh, possible open repair in the near future.  You may choose to have this done either at [facility] or [facility]     Laparoscopic Ventral Hernia Repair Laparoscopic ventral hernia repairis a surgery to fix a ventral hernia. Aventral hernia, also called an incisional hernia, is a bulge of body tissue or intestines that pushes through the front part of the abdomen. This can happen if the connective tissue covering the muscles over the abdomen has a weak spot or is torn because of a surgical cut (incision) from a previous surgery. Laparoscopic ventral hernia repair is often done soon after diagnosis to stop the hernia from getting bigger, becoming uncomfortable, or becoming an emergency. This surgery usually takes about 2 hours, but the time can vary greatly. LET YOUR CAREGIVER KNOW ABOUT:  Any allergies you have.  All medicines you are taking, including steroids, vitamins, herbs, eyedrops, and over-the-counter medicines and creams.  Previous problems you or members of your family have had with the use of anesthetics.  Any blood disorders or bleeding problems you have had.  Past surgeries you have had.  Other health problems you have. RISKS AND COMPLICATIONS  Generally, laparoscopic ventral hernia repair is a safe procedure. However, as with any surgical procedure, complications can occur. Possible complications include:  Bleeding.  Trouble passing urine or having a bowel movement after the operation.  Infection.  Pneumonia.  Blood clots.  Pain in the area of the hernia.  A bulge in the area of the hernia that may be caused by a collection of fluid.  Injury to intestines or other structures in the  abdomen.  Return of the hernia after surgery. In some cases, the caregiver may need to stop the laparoscopic procedure and do regular, open surgery. This may be necessary for very difficult hernias, when organs are hard to see, or when bleeding problems occur during surgery. BEFORE THE PROCEDURE   You may need to have blood tests, urine tests, a chest X-ray, or electrocardiography done before the day of the surgery.  Ask your caregiver about changing or stopping your regular medicines.  You may need to wash with a special type of germ-killing soap.  Do not eat or drink anything for at least 6 hours before the surgery.  Make plans to have someone drive you home after the procedure. PROCEDURE   Small monitors will be put on your body. They are used to check your heart, blood pressure, and oxygen level.  An intravenous (IV) access tube will be put into a vein in your hand or arm. Fluids and medicine will flow directly into your body through the IV tube.  You will be given medicine to make you sleep through the procedure (general anesthetic).  Your abdomen will be cleaned with a special soap to kill any germs on your skin.  Once you are asleep, several small incisions will be made in your abdomen.  The large space in your abdomen will be filled with air so that it expands. This gives the caregiver more room and a better view.  A thin, lighted tube with a tiny camera on the end (laparoscope) is put through a small incision in your abdomen. The camera on the laparoscope sends a picture to  a TV screen in the operating room. This gives the caregiver a good view inside the abdomen.  Hollow tubes are put through the other small incisions in your abdomen. The tools needed for the procedure are put through these tubes.  The caregiver puts the tissue or intestines that formed the hernia back in place.  A screen-like patch (mesh) is used to close the hernia. This helps make the area stronger.  Stitches, tacks, or staples are used to keep the mesh in place.  Medicine and a bandage (dressing) or skin glue will be put over the incisions. AFTER THE PROCEDURE   You will stay in a recovery area until the anesthetic wears off. Your blood pressure and pulse will be checked often.  You may be able to go home the same day or may need to stay in the hospital for 1 or 2 days after this surgery. Your caregiver will decide when you can go home.  You may feel some pain. You will likely be given medicine for pain.  You will be urged to do breathing exercises that involve taking deep breaths. This helps prevent a lung infection after a surgery.  You may have to wear compression stockings while you are in the hospital. These stockings help keep blood clots from forming in your legs. Document Released: 05/30/2012 Document Reviewed: 05/30/2012 [facility] Patient Information 2014 [place], [place].

## 2013-08-08 NOTE — Progress Notes (Addendum)
Patient ID: Shari Lawrence, female   DOB: 1960-06-28, 53 y.o.   MRN: [id number]  Chief Complaint  Patient presents with  . Pre-op Exam    umbilical hernia    HPI Shari Lawrence is a 53 y.o. female.  She returned for further discussion and surgical planning for her incisional hernia.  History is outlined below: She is referred by Dorothea Ogle, PA for evaluation and management of an incisional hernia in the periumbilical area.  The patient has had a laparoscopic myomectomy, laparoscopic cholecystectomy 2004, and earlier this year she had a laparoscopic hysterectomy because of menorrhagia and fibroids. Ovaries are retained. This was done at [facility]. She developed a bulge at her umbilicus in September this year that is painful, especially when she does sit-ups or sings in church. No history of incarceration.She also had a C-section through a Pfannenstiel incision.  Comorbidities reveal that she is generally healthy. Fibromyalgia, hypertension, hyperlipidemia, anxiety, and GERD are mentioned. No prior history of hernia.  She is divorced, has one child, denies tobacco, works at [employer] in [employer] certified [employer].  HPI  Past Medical History  Diagnosis Date  . Anxiety   . GERD (gastroesophageal reflux disease)   . Migraine   . Fibromyalgia   . Uterine fibroid   . Internal hemorrhoids   . Gastric polyp   . Abnormal Pap smear 1988    treated with cryo  . Hyperlipemia   . Anemia 11/2010    hematology consult prior; etiology malabsorption and uterine bleeding  . H/O hysterectomy for benign disease 5/14  . HTN (hypertension) 03/2010    hospitalization for HTN urgency  . History of echocardiogram 03/2010    normal LV function, EF 60-65%, mild left atrial enlargement  . H/O bone density study 12/2007  . Spondylosis, cervical   . Lumbar degenerative disc disease     Past Surgical History  Procedure Laterality Date  . Cholecystectomy  2003  . Myomectomy  1998  .  Cesarean section    . Exploratory laparotomy    . Cervical fusion  2010  . Colonoscopy  08/2010    Dr. Collene Mares  . Esophagogastroduodenoscopy  08/2010    Dr. Collene Mares  . Cervix lesion destruction  1988  . Pelvic laparoscopy    . Robotic assisted laparoscopic hysterectomy and salpingectomy  10/2012    [facility]; laproscopic due to fibroids    Family History  Problem Relation Age of Onset  . Hypertension Mother   . Dementia Mother   . Colon cancer Mother 42  . Deep vein thrombosis Father   . Hypertension Father   . Cancer Father     prostate CA  . Heart disease Father 76    CAD  . Hypertension Sister   . Hyperlipidemia Sister   . Hypertension Brother   . Hyperlipidemia Brother   . Hypertension Maternal Aunt   . Hyperlipidemia Maternal Aunt   . Rheum arthritis Maternal Aunt   . Diabetes Maternal Aunt     1 with type II, 1 with type 1  . Diabetes Paternal Aunt     type II  . Hypertension Brother     Social History History  Substance Use Topics  . Smoking status: Never Smoker   . Smokeless tobacco: Never Used  . Alcohol Use: No    Allergies  Allergen Reactions  . Gluten Meal   . Latex   . Percocet [Oxycodone-Acetaminophen]   . Quinolones   . Sulfa Drugs  [employer]   . Vicodin [Hydrocodone-Acetaminophen]     Current Outpatient Prescriptions  Medication Sig Dispense Refill  . amLODipine (NORVASC) 5 MG tablet Take 5 mg by mouth. Take 5 mg by mouth daily.      . Ascorbic Acid (VITAMIN C) 1000 MG tablet Take 1,000 mg by mouth daily.        Marland Kitchen atorvastatin (LIPITOR) 20 MG tablet Take 1 tablet (20 mg total) by mouth daily.  90 tablet  3  . b complex vitamins tablet Take 1 tablet by mouth daily.      . calcium carbonate (OS-CAL) 600 MG TABS Take 600 mg by mouth daily.        . Cholecalciferol ([employer] VIT D 5000 HIGH-POTENCY) 5000 UNITS capsule Take 2,000 Units by mouth daily.       Marland Kitchen dexlansoprazole (DEXILANT) 60 MG capsule Take 60 mg by mouth daily.        Marland Kitchen estradiol (ESTRACE)  0.5 MG tablet TAKE 1 TABLET (0.5 MG TOTAL) BY MOUTH DAILY.  30 tablet  11  . fish oil-omega-3 fatty acids 1000 MG capsule Take 2 g by mouth daily.      . Iron-FA-B Cmp-C-Biot-Probiotic (FUSION PLUS) CAPS Take 1 capsule by mouth 1 day or 1 dose.  60 capsule  4  . magnesium 30 MG tablet Take 30 mg by mouth daily.        . Multiple Vitamin (MULTIVITAMIN) tablet Take 1 tablet by mouth daily.        . Probiotic Product (PROBIOTIC ACIDOPHILUS PO) Take 1 tablet by mouth daily.        . vitamin E 100 UNIT capsule Take 100 Units by mouth daily.         No current facility-administered medications for this visit.    Review of Systems Review of Systems  Constitutional: Negative for fever, chills and unexpected weight change.  HENT: Negative for congestion, hearing loss, sore throat, trouble swallowing and voice change.   Eyes: Negative for visual disturbance.  Respiratory: Negative for cough and wheezing.   Cardiovascular: Negative for chest pain, palpitations and leg swelling.  Gastrointestinal: Positive for abdominal pain, constipation and abdominal distention. Negative for nausea, vomiting, diarrhea, blood in stool and anal bleeding.  Genitourinary: Negative for hematuria, vaginal bleeding and difficulty urinating.  Musculoskeletal: Negative for arthralgias.  Skin: Negative for rash and wound.  Neurological: Negative for seizures, syncope and headaches.  Hematological: Negative for adenopathy. Does not bruise/bleed easily.  Psychiatric/Behavioral: Negative for confusion.    Blood pressure 98/64, pulse 68, temperature 97.8 F (36.6 C), resp. rate 18, height 5\' 4"  (1.626 m), weight 117 lb (53.071 kg), last menstrual period 01/11/2012.  Physical Exam Physical Exam  Constitutional: She is oriented to person, place, and time. She appears well-developed and well-nourished. No distress.  HENT:  Head: Normocephalic and atraumatic.  Nose: Nose normal.  Mouth/Throat: No oropharyngeal exudate.    Eyes: Conjunctivae and EOM are normal. Pupils are equal, round, and reactive to light. Left eye exhibits no discharge. No scleral icterus.  Neck: Neck supple. No JVD present. No tracheal deviation present. No thyromegaly present.  Cardiovascular: Normal rate, regular rhythm, normal heart sounds and intact distal pulses.   No murmur heard. Pulmonary/Chest: Effort normal and breath sounds normal. No respiratory distress. She has no wheezes. She has no rales. She exhibits no tenderness.  Abdominal: Soft. Bowel sounds are normal. She exhibits no distension and no mass. There is no tenderness. There is no rebound and no  guarding.    3 cm reducible hernia defect in the periumbilical area. Well-healed Pfannenstiel scars. Well-healed trocar sites. Very thin.  Musculoskeletal: She exhibits no edema and no tenderness.  Lymphadenopathy:    She has no cervical adenopathy.  Neurological: She is alert and oriented to person, place, and time. She exhibits normal muscle tone. Coordination normal.  Skin: Skin is warm. No rash noted. She is not diaphoretic. No erythema. No pallor.  Psychiatric: She has a normal mood and affect. Her behavior is normal. Judgment and thought content normal.    Data Reviewed My prior office notes. Office notes from referring provider.  Assessment    Periumbilical incisional hernia. 3 cm defect. Reducible.  History C-section  History laparoscopic myomectomy  History laparoscopic cholecystectomy,  History laparoscopic hysterectomy for fibroids, 2014  History of multilevel cervical discectomy and fusion  Hypertension  Anxiety  GERD     Plan    I once again spent a long time with the patient discussing the anatomy and pathophysiology of her incisional hernia. I discussed the natural history and advised her that it would likely progress. I discussed open repairs with mesh, laparoscopic repairs with mesh. I drew pictures showing her were all the trocar sites would be and  all the suture fixation sites would be. She now understands that she will have multiple puncture wounds and how they will be arranged on the abdominal wall. She knows that she might be converted to an open operation.In the end of the discussion she would like to be considered for a laparoscopic incisional hernia with mesh, possible open.    She is ready to go ahead and schedule the surgery  I discussed the indications, details, techniques, and numerous risks of the surgery with her. She is aware of the risk of bleeding, infection, recurrence, conversion to open laparotomy, injury to adjacent organs such as the intestine with major reconstructive surgery. She is aware that she may have few adhesions or many adhesions which may complicate the procedure. She understands all these issues. At this time all of her questions were answered. She agrees with this plan.   ADDENDUM:  The patient called today stating that she was going to have her hernia surgery done in [place]. She stated it would be" less invasive".   Edsel Petrin. Dalbert Batman, M.D., [facility] General and Minimally invasive Surgery Breast and Colorectal Surgery Office:   [phone] Pager:   [phone]         Adin Hector 08/08/2013, 8:49 AM

## 2013-08-09 ENCOUNTER — Encounter (HOSPITAL_COMMUNITY): Payer: Self-pay | Admitting: Pharmacy Technician

## 2013-08-21 ENCOUNTER — Telehealth (INDEPENDENT_AMBULATORY_CARE_PROVIDER_SITE_OTHER): Payer: Self-pay | Admitting: General Surgery

## 2013-08-21 NOTE — Telephone Encounter (Signed)
Pt went for 2nd opinion in [place] decided to go w them less invasive

## 2013-08-26 ENCOUNTER — Inpatient Hospital Stay (HOSPITAL_COMMUNITY): Admission: RE | Admit: 2013-08-26 | Payer: BC Managed Care – PPO | Source: Ambulatory Visit

## 2013-09-02 ENCOUNTER — Ambulatory Visit (HOSPITAL_COMMUNITY): Admission: RE | Admit: 2013-09-02 | Payer: BC Managed Care – PPO | Source: Ambulatory Visit | Admitting: General Surgery

## 2013-09-02 ENCOUNTER — Encounter (HOSPITAL_COMMUNITY): Admission: RE | Payer: Self-pay | Source: Ambulatory Visit

## 2013-09-02 SURGERY — REPAIR, HERNIA, INCISIONAL, LAPAROSCOPIC
Anesthesia: General

## 2013-09-19 ENCOUNTER — Encounter (INDEPENDENT_AMBULATORY_CARE_PROVIDER_SITE_OTHER): Payer: BC Managed Care – PPO | Admitting: General Surgery

## 2013-09-21 ENCOUNTER — Encounter (HOSPITAL_COMMUNITY): Payer: Self-pay | Admitting: Emergency Medicine

## 2013-09-21 ENCOUNTER — Emergency Department (INDEPENDENT_AMBULATORY_CARE_PROVIDER_SITE_OTHER): Payer: BC Managed Care – PPO

## 2013-09-21 ENCOUNTER — Emergency Department (HOSPITAL_COMMUNITY)
Admission: EM | Admit: 2013-09-21 | Discharge: 2013-09-21 | Disposition: A | Discharge status: Home / Self Care | Payer: BC Managed Care – PPO | Source: Home / Self Care | Attending: Emergency Medicine | Admitting: Emergency Medicine

## 2013-09-21 DIAGNOSIS — R109 Unspecified abdominal pain: Secondary | ICD-10-CM

## 2013-09-21 LAB — CBC WITH DIFFERENTIAL/PLATELET
Basophils Absolute: 0 10*3/uL (ref 0.0–0.1)
Basophils Relative: 0 % (ref 0–1)
Eosinophils Absolute: 0.4 10*3/uL (ref 0.0–0.7)
Eosinophils Relative: 5 % (ref 0–5)
HCT: 32.2 % — ABNORMAL LOW (ref 36.0–46.0)
Hemoglobin: 10.9 g/dL — ABNORMAL LOW (ref 12.0–15.0)
Lymphocytes Relative: 18 % (ref 12–46)
Lymphs Abs: 1.3 10*3/uL (ref 0.7–4.0)
MCH: 30.1 pg (ref 26.0–34.0)
MCHC: 33.9 g/dL (ref 30.0–36.0)
MCV: 89 fL (ref 78.0–100.0)
Monocytes Absolute: 0.6 10*3/uL (ref 0.1–1.0)
Monocytes Relative: 9 % (ref 3–12)
Neutro Abs: 4.9 10*3/uL (ref 1.7–7.7)
Neutrophils Relative %: 68 % (ref 43–77)
Platelets: 282 10*3/uL (ref 150–400)
RBC: 3.62 MIL/uL — ABNORMAL LOW (ref 3.87–5.11)
RDW: 13.6 % (ref 11.5–15.5)
WBC: 7.2 10*3/uL (ref 4.0–10.5)

## 2013-09-21 MED ORDER — DICLOFENAC SODIUM 75 MG PO TBEC: 75.0000 mg | DELAYED_RELEASE_TABLET | Freq: Two times a day (BID) | ORAL | Status: DC

## 2013-09-21 MED ORDER — TRAMADOL HCL 50 MG PO TABS: 100.0000 mg | ORAL_TABLET | Freq: Three times a day (TID) | ORAL | Status: DC | PRN

## 2013-09-21 NOTE — ED Notes (Signed)
Pt reports she had abd/umbilical hernia removed about 2 weeks at [facility] exp pain x3 days ago; pain is constant 6/10 at the moment Increases w/pressure; taking tyle and ibup w/no relief Reports she has not been able to contact her Surgeon Denies d/c from incision wound, f/v/d She is alert w/no signs of acute distress.

## 2013-09-21 NOTE — Discharge Instructions (Signed)
Incision Care  An incision is when a surgeon cuts into your body tissues. After surgery, the incision needs to be cared for properly to prevent infection.   HOME CARE INSTRUCTIONS    Take all medicine as directed by your caregiver. Only take over-the-counter or prescription medicines for pain, discomfort, or fever as directed by your caregiver.   Do not remove your bandage (dressing) or get your incision wet until your surgeon gives you permission. In the event that your dressing becomes wet, dirty, or starts to smell, change the dressing and call your surgeon for instructions as soon as possible.   Take showers. Do not take tub baths, swim, or do anything that may soak the wound until it is healed.   Resume your normal diet and activities as directed or allowed.   Avoid lifting any weight until you are instructed otherwise.   Use anti-itch antihistamine medicine as directed by your caregiver. The wound may itch when it is healing. Do not pick or scratch at the wound.   Follow up with your caregiver for stitch (suture) or staple removal as directed.   Drink enough fluids to keep your urine clear or pale yellow.  SEEK MEDICAL CARE IF:    You have redness, swelling, or increasing pain in the wound that is not controlled with medicine.   You have drainage, blood, or pus coming from the wound that lasts longer than 1 day.   You develop muscle aches, chills, or a general ill feeling.   You notice a bad smell coming from the wound or dressing.   Your wound edges separate after the sutures, staples, or skin adhesive strips have been removed.   You develop persistent nausea or vomiting.  SEEK IMMEDIATE MEDICAL CARE IF:    You have a fever.   You develop a rash.   You develop dizzy episodes or faint while standing.   You have difficulty breathing.   You develop any reaction or side effects to medicine given.  MAKE SURE YOU:    Understand these instructions.   Will watch your condition.   Will get help  right away if you are not doing well or get worse.  Document Released: 12/31/2004 Document Revised: 09/05/2011 Document Reviewed: 10/17/2010  ExitCare Patient Information 2014 [employer].

## 2013-09-21 NOTE — ED Provider Notes (Signed)
Chief Complaint   Chief Complaint  Patient presents with  . Hernia    History of Present Illness   Shari Lawrence is a 53 year old female who had umbilical hernia surgery at [facility] 2 weeks ago. Since then she's had continuing pain at the surgical site, mild swelling, and nausea. She denies fever, chills, vomiting, or change in her bowel habits. She had been given Dilaudid but ran out of pain medication. She tried to get a hold someone from [facility] but was unable to do so, so she came here.  Review of Systems   Other than as noted above, the patient denies any of the following symptoms: Constitutional:  No fever, chills, weight loss or anorexia. Abdomen:  No nausea, vomiting, hematememesis, melena, diarrhea, or hematochezia. GU:  No dysuria, frequency, urgency, or hematuria. Gyn:  No vaginal discharge, itching, abnormal bleeding, dyspareunia, or pelvic pain.  [place]   Past medical history, family history, social history, meds, and allergies were reviewed. The patient is allergic to codeine containing medications, sulfa, and quinolones. She takes Lipitor, amlodipine, and DEXILANT. She has a history of elevated cholesterol, hypertension, and gastroesophageal reflux.  Physical Exam     Vital signs:  BP 110/72  Pulse 73  Temp(Src) 98.6 F (37 C) (Oral)  Resp 14  SpO2 100%  LMP 01/11/2012 Gen:  Alert, oriented, in no distress. Lungs:  Breath sounds clear and equal bilaterally.  No wheezes, rales or rhonchi. Heart:  Regular rhythm.  No gallops or murmers.   Abdomen:  She has a surgical site around her umbilicus. This is been closed with tissue glue. There is slight swelling at the site and some induration as would be expected from previous surgery. There was no erythema or purulent drainage. No fluctuance. Abdomen was otherwise nontender without organomegaly or mass. Bowel sounds are normally active. Skin:  Clear, warm and dry.  No rash.  Labs   Results for orders placed during  the hospital encounter of 09/21/13  CBC WITH DIFFERENTIAL      Result Value Ref Range   WBC 7.2  4.0 - 10.5 K/uL   RBC 3.62 (*) 3.87 - 5.11 MIL/uL   Hemoglobin 10.9 (*) 12.0 - 15.0 g/dL   HCT 32.2 (*) 36.0 - 46.0 %   MCV 89.0  78.0 - 100.0 fL   MCH 30.1  26.0 - 34.0 pg   MCHC 33.9  30.0 - 36.0 g/dL   RDW 13.6  11.5 - 15.5 %   Platelets 282  150 - 400 K/uL   Neutrophils Relative % 68  43 - 77 %   Neutro Abs 4.9  1.7 - 7.7 K/uL   Lymphocytes Relative 18  12 - 46 %   Lymphs Abs 1.3  0.7 - 4.0 K/uL   Monocytes Relative 9  3 - 12 %   Monocytes Absolute 0.6  0.1 - 1.0 K/uL   Eosinophils Relative 5  0 - 5 %   Eosinophils Absolute 0.4  0.0 - 0.7 K/uL   Basophils Relative 0  0 - 1 %   Basophils Absolute 0.0  0.0 - 0.1 K/uL     Radiology   Dg Abd Acute W/chest  09/21/2013   CLINICAL DATA:  Abdominal pain.  Hernia surgery 2 weeks ago.  EXAM: ACUTE ABDOMEN SERIES (ABDOMEN 2 VIEW & CHEST 1 VIEW)  COMPARISON:  DG CERVICAL SPINE COMPLETE 4+V dated 06/09/2010; DG CHEST 2 VIEW dated 04/07/2010  FINDINGS: Lower cervical plate and screw fixator. The lungs appear  clear. Cardiac and mediastinal margins appear normal. Mild thoracolumbar dextroconvex scoliosis.  No free intraperitoneal gas. No abnormal air-fluid levels. Bowel gas pattern unremarkable. Calcifications projecting over the upper kidneys favor costal cartilage calcifications over nephrolithiasis.  IMPRESSION: 1. Unremarkable bowel gas pattern.  No acute findings.   Electronically Signed   By: Sherryl Barters M.D.   On: 09/21/2013 15:33   Assessment   The encounter diagnosis was Abdominal pain.  She has ongoing pain at the surgical site. This may just be soft tissue inflammation or possibly could be neuropathic pain. There is nothing to suggest infection or bowel obstruction. I would like to step down from the Dilaudid and was given diclofenac and tramadol instead. Suggested she followup with her surgeon next week.  Plan     1.  Meds:  The  following meds were prescribed:   Discharge Medication List as of 09/21/2013  3:43 PM    START taking these medications   Details  diclofenac (VOLTAREN) 75 MG EC tablet Take 1 tablet (75 mg total) by mouth 2 (two) times daily., Starting 09/21/2013, Until Discontinued, Normal    traMADol (ULTRAM) 50 MG tablet Take 2 tablets (100 mg total) by mouth every 8 (eight) hours as needed., Starting 09/21/2013, Until Discontinued, Normal        2.  Patient Education/Counseling:  The patient was given appropriate handouts, self care instructions, and instructed in symptomatic relief.    3.  Follow up:  The patient was told to follow up here if no better in 3 to 4 days, or sooner if becoming worse in any way, and given some red flag symptoms such as worsening pain, fever, vomiting, or evidence of GI bleeding which would prompt immediate return.  Follow up here as necessary.    Harden Mo, MD 09/21/13 2124

## 2013-10-25 HISTORY — PX: UMBILICAL HERNIA REPAIR: SHX196

## 2013-11-05 ENCOUNTER — Ambulatory Visit (INDEPENDENT_AMBULATORY_CARE_PROVIDER_SITE_OTHER): Payer: BC Managed Care – PPO | Admitting: Medical

## 2013-11-05 ENCOUNTER — Encounter: Payer: Self-pay | Admitting: Medical

## 2013-11-05 VITALS — BP 130/80 | HR 58 | Temp 98.2°F | Resp 14 | Wt 113.0 lb

## 2013-11-05 DIAGNOSIS — G4700 Insomnia, unspecified: Secondary | ICD-10-CM

## 2013-11-05 DIAGNOSIS — R5381 Other malaise: Secondary | ICD-10-CM

## 2013-11-05 DIAGNOSIS — F430 Acute stress reaction: Secondary | ICD-10-CM

## 2013-11-05 DIAGNOSIS — D649 Anemia, unspecified: Secondary | ICD-10-CM

## 2013-11-05 DIAGNOSIS — M791 Myalgia, unspecified site: Secondary | ICD-10-CM

## 2013-11-05 DIAGNOSIS — T8140XA Infection following a procedure, unspecified, initial encounter: Secondary | ICD-10-CM

## 2013-11-05 DIAGNOSIS — IMO0001 Reserved for inherently not codable concepts without codable children: Secondary | ICD-10-CM

## 2013-11-05 DIAGNOSIS — R5383 Other fatigue: Secondary | ICD-10-CM

## 2013-11-05 LAB — CK: Total CK: 87 U/L (ref 7–177)

## 2013-11-05 LAB — COMPREHENSIVE METABOLIC PANEL
ALT: 32 U/L (ref 0–35)
AST: 25 U/L (ref 0–37)
Albumin: 4.1 g/dL (ref 3.5–5.2)
Alkaline Phosphatase: 82 U/L (ref 39–117)
BUN: 11 mg/dL (ref 6–23)
CO2: 26 mEq/L (ref 19–32)
Calcium: 9.7 mg/dL (ref 8.4–10.5)
Chloride: 105 mEq/L (ref 96–112)
Creat: 0.78 mg/dL (ref 0.50–1.10)
Glucose, Bld: 81 mg/dL (ref 70–99)
Potassium: 4.1 mEq/L (ref 3.5–5.3)
Sodium: 140 mEq/L (ref 135–145)
Total Bilirubin: 0.3 mg/dL (ref 0.2–1.2)
Total Protein: 7.1 g/dL (ref 6.0–8.3)

## 2013-11-05 LAB — CBC WITH DIFFERENTIAL/PLATELET
Basophils Absolute: 0 10*3/uL (ref 0.0–0.1)
Basophils Relative: 1 % (ref 0–1)
Eosinophils Absolute: 0.2 10*3/uL (ref 0.0–0.7)
Eosinophils Relative: 5 % (ref 0–5)
HCT: 35.7 % — ABNORMAL LOW (ref 36.0–46.0)
Hemoglobin: 11.6 g/dL — ABNORMAL LOW (ref 12.0–15.0)
Lymphocytes Relative: 41 % (ref 12–46)
Lymphs Abs: 1.7 10*3/uL (ref 0.7–4.0)
MCH: 28.9 pg (ref 26.0–34.0)
MCHC: 32.5 g/dL (ref 30.0–36.0)
MCV: 88.8 fL (ref 78.0–100.0)
Monocytes Absolute: 0.2 10*3/uL (ref 0.1–1.0)
Monocytes Relative: 6 % (ref 3–12)
Neutro Abs: 1.9 10*3/uL (ref 1.7–7.7)
Neutrophils Relative %: 47 % (ref 43–77)
Platelets: 210 10*3/uL (ref 150–400)
RBC: 4.02 MIL/uL (ref 3.87–5.11)
RDW: 15.8 % — ABNORMAL HIGH (ref 11.5–15.5)
WBC: 4.1 10*3/uL (ref 4.0–10.5)

## 2013-11-05 MED ORDER — CYCLOBENZAPRINE HCL 10 MG PO TABS: ORAL_TABLET | ORAL | Status: DC

## 2013-11-05 NOTE — Progress Notes (Signed)
Subjective:   Shari Lawrence is a 53 y.o. female presenting on 11/05/2013 with Pt. had surgery on 09/04/13 for hernia and she is in pain and she has been in the hospital 3 x times since the surgery  Here for f/u  Since last visit here had umbilical hernia surgery at [facility], but had major complications since then.   She notes begin bed ridden 7 weeks, was hospitalized 3 different times since hernia surgery on 09/04/13.  Had to go back 10/01/13 and 10/08/13, released 10/25/13.  Ended up having "fluid" pocket in abdomen, post op infection, and after rounds of antibiotics and aspirations, had to have stomach tube/port.   Surgeons ended up having to send fluid to [facility] for analysis given unusual bug and unusual sensitivity results.  Currently off antibiotics, but was on Augmentin at the end of April, was on Clindamycin for a while but didn't tolerate this.  At this point was released from surgeon at [facility] 10/25/13.  Was out of work most of March and April.   She is here because of current symptoms.  Staying fatigued, tired, back locks up all the time, can't sit or stand for long periods of time.   Still gets nauseated a lot, eating small amounts of food.   Had lost 12 lb during this ordeal.  On top of all she has been through the past 3 months, she and her boss are not getting along.  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  Feels like her boss has a personal grudge with her and she won't speak to her.  Dealing with the stress of that impaired relationship on top of all the physical stress she has had to endure with recent multiple hospitalizations. No other aggravating or relieving factors.  No other complaint.  Review of Systems Constitutional: -fever, -chills, +some sweats ENT: -runny nose, -ear pain, -sore throat Cardiology:  +burning/chest pressure at times, intermittent, -palpitations, -edema Respiratory: -cough, -shortness of breath, -wheezing Gastroenterology: +crampy  abdominal pain, +nausea, -vomiting, -diarrhea, -constipation  Hematology: -bleeding or bruising problems Musculoskeletal: -arthralgias, +generalized myalgias, -joint swelling, +back pain Ophthalmology: -vision changes Urology: -dysuria, -difficulty urinating, -hematuria, -urinary frequency, -urgency Neurology: -headache, -weakness, -tingling, -numbness         Objective:     Filed Vitals:   11/05/13 1104  BP: 130/80  Pulse: 58  Temp: 98.2 F (36.8 C)  Resp: 14    General appearance: alert, no distress, WD/WN, petite AA female HEENT: normocephalic, sclerae anicteric, TMs pearly, nares patent, no discharge or erythema, pharynx normal Oral cavity: MMM, no lesions Neck: supple, no lymphadenopathy, no thyromegaly, no masses Heart: RRR, normal S1, S2, no murmurs Lungs: CTA bilaterally, no wheezes, rhonchi, or rales Abdomen: +bs, umbilical and bilat lower surgical port scars, mild generalized tenderness, soft, non distended, no masses, no hepatomegaly, no splenomegaly Shoddy tender bilat inguinal lymph nodes palpable Pulses: 2+ symmetric, upper and lower extremities, normal cap refill Ext: no edema MSK: arms/legs with mild generalized tenderness to palpation, no joint swelling or joint tenderness, no deformity Back: tender paraspinal throughout     Assessment: Encounter Diagnoses  Name Primary?  . Other malaise and fatigue Yes  . Insomnia   . Myalgia   . Anemia   . Acute stress reaction   . Post op infection      Plan: We discussed her concerns and multiple symptoms which are unclear but may even be psychosomatic.  I think she is  currently under nourished.  I advise daily multivitamin, daily B complex vitamin, advise at least 3 meals a day, supplement with Ensure shakes if needed, discussed getting at least 3 servings of fruits, 3 servings of grains, 3 servings of vegetables daily, and 3 servings of protein daily, increase water intake.   Gave Flexeril for short-term  myalgias and sleep.  We will call lab results and next steps.  Consider SSRI if needed.  Daily was seen today for pt. had surgery on 09/04/13 for hernia and she is in pain and she has been in the hospital 3 x times since the surgery.  Diagnoses and associated orders for this visit:  Other malaise and fatigue - Comprehensive metabolic panel - CBC with Differential - TSH - Sedimentation rate  Insomnia - Comprehensive metabolic panel - CBC with Differential - TSH - Sedimentation rate  Myalgia  Anemia - Comprehensive metabolic panel - CBC with Differential - TSH - Sedimentation rate  Acute stress reaction - Comprehensive metabolic panel - CBC with Differential - TSH - Sedimentation rate  Post op infection  Other Orders - cyclobenzaprine (FLEXERIL) 10 MG tablet; 1/2-1 tablet at bedtime prn     Return pending labs.

## 2013-11-06 LAB — TSH: TSH: 0.64 u[IU]/mL (ref 0.350–4.500)

## 2013-11-06 LAB — SEDIMENTATION RATE: Sed Rate: 14 mm/hr (ref 0–22)

## 2013-12-30 ENCOUNTER — Emergency Department (HOSPITAL_COMMUNITY): Payer: BC Managed Care – PPO

## 2013-12-30 ENCOUNTER — Emergency Department (HOSPITAL_COMMUNITY)
Admission: EM | Admit: 2013-12-30 | Discharge: 2013-12-30 | Disposition: A | Discharge status: Home / Self Care | Payer: BC Managed Care – PPO | Attending: Emergency Medicine | Admitting: Emergency Medicine

## 2013-12-30 ENCOUNTER — Encounter (HOSPITAL_COMMUNITY): Payer: Self-pay | Admitting: Emergency Medicine

## 2013-12-30 DIAGNOSIS — Z8589 Personal history of malignant neoplasm of other organs and systems: Secondary | ICD-10-CM | POA: Insufficient documentation

## 2013-12-30 DIAGNOSIS — R1012 Left upper quadrant pain: Secondary | ICD-10-CM | POA: Insufficient documentation

## 2013-12-30 DIAGNOSIS — Z880 Allergy status to penicillin: Secondary | ICD-10-CM | POA: Insufficient documentation

## 2013-12-30 DIAGNOSIS — M51379 Other intervertebral disc degeneration, lumbosacral region without mention of lumbar back pain or lower extremity pain: Secondary | ICD-10-CM | POA: Insufficient documentation

## 2013-12-30 DIAGNOSIS — K219 Gastro-esophageal reflux disease without esophagitis: Secondary | ICD-10-CM | POA: Insufficient documentation

## 2013-12-30 DIAGNOSIS — Z79899 Other long term (current) drug therapy: Secondary | ICD-10-CM | POA: Insufficient documentation

## 2013-12-30 DIAGNOSIS — Z862 Personal history of diseases of the blood and blood-forming organs and certain disorders involving the immune mechanism: Secondary | ICD-10-CM | POA: Insufficient documentation

## 2013-12-30 DIAGNOSIS — Z8659 Personal history of other mental and behavioral disorders: Secondary | ICD-10-CM | POA: Insufficient documentation

## 2013-12-30 DIAGNOSIS — E785 Hyperlipidemia, unspecified: Secondary | ICD-10-CM | POA: Insufficient documentation

## 2013-12-30 DIAGNOSIS — G43909 Migraine, unspecified, not intractable, without status migrainosus: Secondary | ICD-10-CM | POA: Insufficient documentation

## 2013-12-30 DIAGNOSIS — Z91040 Latex allergy status: Secondary | ICD-10-CM | POA: Insufficient documentation

## 2013-12-30 DIAGNOSIS — M5137 Other intervertebral disc degeneration, lumbosacral region: Secondary | ICD-10-CM | POA: Insufficient documentation

## 2013-12-30 DIAGNOSIS — K5900 Constipation, unspecified: Secondary | ICD-10-CM | POA: Insufficient documentation

## 2013-12-30 DIAGNOSIS — Z90710 Acquired absence of both cervix and uterus: Secondary | ICD-10-CM | POA: Insufficient documentation

## 2013-12-30 DIAGNOSIS — M47812 Spondylosis without myelopathy or radiculopathy, cervical region: Secondary | ICD-10-CM | POA: Insufficient documentation

## 2013-12-30 DIAGNOSIS — I10 Essential (primary) hypertension: Secondary | ICD-10-CM | POA: Insufficient documentation

## 2013-12-30 DIAGNOSIS — Z9189 Other specified personal risk factors, not elsewhere classified: Secondary | ICD-10-CM | POA: Insufficient documentation

## 2013-12-30 LAB — CBC WITH DIFFERENTIAL/PLATELET
Basophils Absolute: 0 10*3/uL (ref 0.0–0.1)
Basophils Relative: 1 % (ref 0–1)
Eosinophils Absolute: 0.2 10*3/uL (ref 0.0–0.7)
Eosinophils Relative: 5 % (ref 0–5)
HCT: 41.2 % (ref 36.0–46.0)
Hemoglobin: 13.2 g/dL (ref 12.0–15.0)
Lymphocytes Relative: 49 % — ABNORMAL HIGH (ref 12–46)
Lymphs Abs: 1.8 10*3/uL (ref 0.7–4.0)
MCH: 29.1 pg (ref 26.0–34.0)
MCHC: 32 g/dL (ref 30.0–36.0)
MCV: 90.9 fL (ref 78.0–100.0)
Monocytes Absolute: 0.3 10*3/uL (ref 0.1–1.0)
Monocytes Relative: 7 % (ref 3–12)
Neutro Abs: 1.4 10*3/uL — ABNORMAL LOW (ref 1.7–7.7)
Neutrophils Relative %: 38 % — ABNORMAL LOW (ref 43–77)
Platelets: 188 10*3/uL (ref 150–400)
RBC: 4.53 MIL/uL (ref 3.87–5.11)
RDW: 15.4 % (ref 11.5–15.5)
WBC: 3.7 10*3/uL — ABNORMAL LOW (ref 4.0–10.5)

## 2013-12-30 LAB — COMPREHENSIVE METABOLIC PANEL
ALT: 72 U/L — ABNORMAL HIGH (ref 0–35)
ALT: 64 U/L — ABNORMAL HIGH (ref 0–35)
AST: 68 U/L — ABNORMAL HIGH (ref 0–37)
AST: 50 U/L — ABNORMAL HIGH (ref 0–37)
Albumin: 4.2 g/dL (ref 3.5–5.2)
Albumin: 3.9 g/dL (ref 3.5–5.2)
Alkaline Phosphatase: 102 U/L (ref 39–117)
Alkaline Phosphatase: 97 U/L (ref 39–117)
Anion gap: 15 (ref 5–15)
Anion gap: 12 (ref 5–15)
BUN: 9 mg/dL (ref 6–23)
BUN: 9 mg/dL (ref 6–23)
CO2: 25 mEq/L (ref 19–32)
CO2: 26 mEq/L (ref 19–32)
Calcium: 10.3 mg/dL (ref 8.4–10.5)
Calcium: 9.6 mg/dL (ref 8.4–10.5)
Chloride: 103 mEq/L (ref 96–112)
Chloride: 105 mEq/L (ref 96–112)
Creatinine, Ser: 0.74 mg/dL (ref 0.50–1.10)
Creatinine, Ser: 0.73 mg/dL (ref 0.50–1.10)
GFR calc Af Amer: >90 mL/min (ref >90)
GFR calc Af Amer: >90 mL/min (ref >90)
GFR calc non Af Amer: >90 mL/min (ref >90)
GFR calc non Af Amer: >90 mL/min (ref >90)
Glucose, Bld: 74 mg/dL (ref 70–99)
Glucose, Bld: 80 mg/dL (ref 70–99)
Potassium: 4.7 mEq/L (ref 3.7–5.3)
Potassium: 4.1 mEq/L (ref 3.7–5.3)
Sodium: 143 mEq/L (ref 137–147)
Sodium: 143 mEq/L (ref 137–147)
Total Bilirubin: 0.3 mg/dL (ref 0.3–1.2)
Total Bilirubin: 0.3 mg/dL (ref 0.3–1.2)
Total Protein: 7.9 g/dL (ref 6.0–8.3)
Total Protein: 7.2 g/dL (ref 6.0–8.3)

## 2013-12-30 LAB — LIPASE, BLOOD: Lipase: 26 U/L (ref 11–59)

## 2013-12-30 LAB — URINALYSIS, ROUTINE W REFLEX MICROSCOPIC
Bilirubin Urine: NEGATIVE
Glucose, UA: NEGATIVE mg/dL
Hgb urine dipstick: NEGATIVE
Ketones, ur: NEGATIVE mg/dL
Leukocytes, UA: NEGATIVE
Nitrite: NEGATIVE
Protein, ur: NEGATIVE mg/dL
Specific Gravity, Urine: 1.007 (ref 1.005–1.030)
Urobilinogen, UA: 0.2 mg/dL (ref 0.0–1.0)
pH: 8 (ref 5.0–8.0)

## 2013-12-30 LAB — TROPONIN I
Troponin I: <0.3 ng/mL (ref <0.30)
Troponin I: <0.3 ng/mL (ref <0.30)

## 2013-12-30 LAB — D-DIMER, QUANTITATIVE: D-Dimer, Quant: <0.27 ug/mL-FEU (ref 0.00–0.48)

## 2013-12-30 MED ORDER — IOHEXOL 300 MG/ML  SOLN
80.0000 mL | Freq: Once | INTRAMUSCULAR | Status: AC | PRN
  Administered 2013-12-30: 80 mL via INTRAVENOUS

## 2013-12-30 MED ORDER — SODIUM CHLORIDE 0.9 % IV BOLUS (SEPSIS)
1000.0000 mL | Freq: Once | INTRAVENOUS | Status: AC
  Administered 2013-12-30: 1000 mL via INTRAVENOUS

## 2013-12-30 MED ORDER — IOHEXOL 300 MG/ML  SOLN
25.0000 mL | INTRAMUSCULAR | Status: AC
  Administered 2013-12-30: 25 mL via ORAL

## 2013-12-30 MED ORDER — DOCUSATE SODIUM 100 MG PO CAPS: 100.0000 mg | ORAL_CAPSULE | Freq: Two times a day (BID) | ORAL | Status: DC

## 2013-12-30 MED ORDER — HYDROMORPHONE HCL PF 1 MG/ML IJ SOLN
1.0000 mg | Freq: Once | INTRAMUSCULAR | Status: AC
  Administered 2013-12-30: 1 mg via INTRAVENOUS
  Filled 2013-12-30: qty 1

## 2013-12-30 MED ORDER — ONDANSETRON HCL 4 MG/2ML IJ SOLN
4.0000 mg | Freq: Once | INTRAMUSCULAR | Status: AC
  Administered 2013-12-30: 4 mg via INTRAVENOUS
  Filled 2013-12-30: qty 2

## 2013-12-30 MED ORDER — LACTULOSE 10 GM/15ML PO SOLN: 20.0000 g | Freq: Three times a day (TID) | ORAL | Status: DC

## 2013-12-30 NOTE — ED Provider Notes (Signed)
Medical screening examination/treatment/procedure(s) were performed by non-physician practitioner and as supervising physician I was immediately available for consultation/collaboration.   EKG Interpretation   Date/Time:  Monday December 30 2013 09:17:53 EDT Ventricular Rate:  64 PR Interval:  177 QRS Duration: 94 QT Interval:  398 QTC Calculation: 411 R Axis:   81 Text Interpretation:  Age not entered, assumed to be  53 years old for  purpose of ECG interpretation Sinus rhythm Probable left atrial  enlargement No significant change since last tracing Confirmed by Jordon Kristiansen,   DO, Niles Ess [phone]) on 12/30/2013 9:27:41 AM        [place], DO 12/30/13 1729

## 2013-12-30 NOTE — ED Notes (Signed)
Phleb asked to collect CMP reorder sample since pt difficult stick.

## 2013-12-30 NOTE — Discharge Instructions (Signed)
Return here as needed.  Followup with your primary care Dr. increase your fluid intake. your CT scan showed, constipation

## 2013-12-30 NOTE — ED Provider Notes (Signed)
CSN: [id number]     Arrival date & time 12/30/13  0915 History   First MD Initiated Contact with Patient 12/30/13 0930     Chief Complaint  Patient presents with  . Chest Pain  . Abdominal Pain     (Consider location/radiation/quality/duration/timing/severity/associated sxs/prior Treatment) HPI Patient presents to the emergency department with upper abdominal pain, bilaterally.  Patient, states, that the pain started earlier this morning.  He states it seems to maybe  go into her lower chest bilaterally and into her back.  Patient, states she didn't take any medications prior to arrival.  She states that nothing seems make her condition, better or worse.  Patient, states, that she does not have any shortness of breath, nausea, vomiting, weakness, dizziness, neck pain, headache, blurred vision, diarrhea, fever, or syncope.  Patient, states, that she's had multiple abdominal surgeries recently.     tPast Medical History  Diagnosis Date  . Anxiety   . GERD (gastroesophageal reflux disease)   . Migraine   . Fibromyalgia   . Uterine fibroid   . Internal hemorrhoids   . Gastric polyp   . Abnormal Pap smear 1988    treated with cryo  . Hyperlipemia   . Anemia 11/2010    hematology consult prior; etiology malabsorption and uterine bleeding  . H/O hysterectomy for benign disease 5/14  . HTN (hypertension) 03/2010    hospitalization for HTN urgency  . History of echocardiogram 03/2010    normal LV function, EF 60-65%, mild left atrial enlargement  . H/O bone density study 12/2007  . Spondylosis, cervical   . Lumbar degenerative disc disease    Past Surgical History  Procedure Laterality Date  . Cholecystectomy  2003  . Myomectomy  1998  . Cesarean section    . Exploratory laparotomy    . Cervical fusion  2010  . Colonoscopy  08/2010    Dr. Collene Mares  . Esophagogastroduodenoscopy  08/2010    Dr. Collene Mares  . Cervix lesion destruction  1988  . Pelvic laparoscopy    . Robotic assisted  laparoscopic hysterectomy and salpingectomy  10/2012    [facility]; laproscopic due to fibroids   Family History  Problem Relation Age of Onset  . Hypertension Mother   . Dementia Mother   . Colon cancer Mother 60  . Deep vein thrombosis Father   . Hypertension Father   . Cancer Father     prostate CA  . Heart disease Father 33    CAD  . Hypertension Sister   . Hyperlipidemia Sister   . Hypertension Brother   . Hyperlipidemia Brother   . Hypertension Maternal Aunt   . Hyperlipidemia Maternal Aunt   . Rheum arthritis Maternal Aunt   . Diabetes Maternal Aunt     1 with type II, 1 with type 1  . Diabetes Paternal Aunt     type II  . Hypertension Brother    History  Substance Use Topics  . Smoking status: Never Smoker   . Smokeless tobacco: Never Used  . Alcohol Use: No   OB History   Grav Para Term Preterm Abortions TAB SAB Ect Mult Living   1 1             Review of Systems  All other systems negative except as documented in the HPI. All pertinent positives and negatives as reviewed in the HPI.  Allergies  Quinolones; Gluten meal; Latex; Penicillins; Percocet; Sulfa drugs cross reactors; and Vicodin  Home Medications  Prior to Admission medications   Medication Sig Start Date End Date Taking? Authorizing Provider  amLODipine (NORVASC) 5 MG tablet Take 5 mg by mouth daily. Take 5 mg by mouth daily.   Yes Historical Provider, MD  Ascorbic Acid (VITAMIN C) 1000 MG tablet Take 1,000 mg by mouth daily.    Yes Historical Provider, MD  aspirin EC 81 MG tablet Take 81 mg by mouth daily.   Yes Historical Provider, MD  atorvastatin (LIPITOR) 20 MG tablet Take 20 mg by mouth daily. 01/03/13  Yes [name] Tysinger, PA-C  calcium carbonate (OS-CAL) 600 MG TABS Take 600 mg by mouth daily.    Yes Historical Provider, MD  dexlansoprazole (DEXILANT) 60 MG capsule Take 60 mg by mouth daily.    Yes Historical Provider, MD  estradiol (ESTRACE) 0.5 MG tablet Take 0.5 mg by mouth daily.   Yes  Historical Provider, MD  fish oil-omega-3 fatty acids 1000 MG capsule Take 2 g by mouth daily.   Yes Historical Provider, MD  Iron-FA-B Cmp-C-Biot-Probiotic (FUSION PLUS) CAPS Take 1 capsule by mouth daily.  03/06/13  Yes Azalia Bilis, MD  magnesium 30 MG tablet Take 30 mg by mouth daily.    Yes Historical Provider, MD  Multiple Vitamin (MULTIVITAMIN) tablet Take 1 tablet by mouth daily.    Yes Historical Provider, MD  phenazopyridine (PYRIDIUM) 200 MG tablet Take 200 mg by mouth 3 (three) times daily as needed (for possible bladder infection).   Yes Historical Provider, MD  vitamin B-12 (CYANOCOBALAMIN) 500 MCG tablet Take 500 mcg by mouth daily.   Yes Historical Provider, MD   BP 142/93  Pulse 58  Temp(Src) 97.7 F (36.5 C) (Oral)  Resp 18  SpO2 100%  LMP 01/11/2012 Physical Exam  Constitutional: She is oriented to person, place, and time. She appears well-developed and well-nourished. No distress.  HENT:  Head: Normocephalic and atraumatic.  Mouth/Throat: Oropharynx is clear and moist.  Eyes: Pupils are equal, round, and reactive to light.  Neck: Normal range of motion. Neck supple.  Cardiovascular: Normal rate, regular rhythm and normal heart sounds.  Exam reveals no gallop and no friction rub.   No murmur heard. Pulmonary/Chest: Effort normal and breath sounds normal. Not tachypneic. No respiratory distress. She has no decreased breath sounds. She has no wheezes. She has no rhonchi.  Abdominal: Soft. Normal appearance and bowel sounds are normal. She exhibits no distension. There is tenderness in the left upper quadrant. There is no rigidity, no rebound, no guarding and no CVA tenderness. No hernia.    Neurological: She is alert and oriented to person, place, and time. She exhibits normal muscle tone. Coordination normal.  Skin: Skin is warm and dry. No rash noted. No erythema.    ED Course  Procedures (including critical care time) Labs Review Labs Reviewed  URINE CULTURE    CBC WITH DIFFERENTIAL  COMPREHENSIVE METABOLIC PANEL  LIPASE, BLOOD  D-DIMER, QUANTITATIVE  URINALYSIS, ROUTINE W REFLEX MICROSCOPIC  TROPONIN I    Imaging Review Ct Abdomen Pelvis W Contrast  12/30/2013   CLINICAL DATA:  Epigastric and abdominal pain.  EXAM: CT ABDOMEN AND PELVIS WITH CONTRAST  TECHNIQUE: Multidetector CT imaging of the abdomen and pelvis was performed using the standard protocol following bolus administration of intravenous contrast.  CONTRAST:  59mL OMNIPAQUE IOHEXOL 300 MG/ML  SOLN  COMPARISON:  None.  FINDINGS: The lung bases are clear except for dependent subpleural atelectasis. The heart is normal in size. No pericardial effusion. The  distal esophagus is grossly normal. The aorta is normal in caliber.  The liver is unremarkable. No focal lesions. The gallbladder is surgically absent. There is mild associated common bile duct and central intrahepatic ductal dilatation. The common bile duct in the porta hepatis measures 10 mm and in the head of the pancreas 6 mm. No obvious common bile duct stones. Recommend correlation with liver function studies. The pancreas is unremarkable. The spleen is normal in size. No focal lesions. The adrenal glands and kidneys are unremarkable.  The stomach, duodenum and small bowel are unremarkable. No inflammatory changes, mass lesions or obstructive findings. The colon is moderately distended with stool throughout suggesting constipation. No mass lesions or inflammatory changes. The appendix is normal.  No mesenteric or retroperitoneal mass or adenopathy. The aorta and branch vessels are normal. The major venous structures are patent.  There is moderate soft tissue thickening and enhancement in the region of the umbilicus. A soft tissue mass measures approximately 2.4 x 1.5 cm. This could be an endometrial implant or scar/granulation tissue associated with the prior surgeries. Recommend clinical correlation. This should be palpable.  The bladder is  unremarkable. The uterus is surgically absent. No pelvic mass or adenopathy. No free pelvic fluid collections. No inguinal mass or adenopathy. The bony pelvis is intact.  IMPRESSION: Status postcholecystectomy with associated intra and extrahepatic biliary dilatation. Recommend correlation with liver function studies.  Large amount of stool throughout the entire colon suggesting constipation.  2.4 x 1.5 cm soft tissue mass and surrounding skin thickening in the umbilical region. This could be scar tissue or possible endometrial implant.   Electronically Signed   By: Kalman Jewels M.D.   On: 12/30/2013 14:55   Dg Abd Acute W/chest  12/30/2013   CLINICAL DATA:  Epigastric pain.  Chest pain.  EXAM: ACUTE ABDOMEN SERIES (ABDOMEN 2 VIEW & CHEST 1 VIEW)  COMPARISON:  Chest in two views abdomen 09/21/2013.  FINDINGS: Single view of the chest demonstrates clear lungs and normal heart size. No pneumothorax or pleural effusion.  Two views of the abdomen show no free intraperitoneal air. The bowel gas pattern is nonobstructive. Large stool burden is noted. Cholecystectomy clips are identified. Scoliosis is noted.  IMPRESSION: No acute finding chest or abdomen.  Large colonic stool burden.   Electronically Signed   By: Inge Rise M.D.   On: 12/30/2013 10:24     EKG Interpretation   Date/Time:  Monday December 30 2013 09:17:53 EDT Ventricular Rate:  64 PR Interval:  177 QRS Duration: 94 QT Interval:  398 QTC Calculation: 411 R Axis:   81 Text Interpretation:  Age not entered, assumed to be  53 years old for  purpose of ECG interpretation Sinus rhythm Probable left atrial  enlargement No significant change since last tracing Confirmed by WARD,   DO, KRISTEN [phone]) on 12/30/2013 9:27:41 AM       Patient most likely has patient is causing her left-sided, abdominal pain.  The patient is PERC negative and low risk based on [place].Patient has atypical symptoms for cardiac chest pain is advised followup  with her primary care Dr. told to return here as needed  Brent General, PA-C 12/30/13 1521

## 2013-12-30 NOTE — ED Notes (Signed)
Patient transported to X-ray without distress.  

## 2013-12-30 NOTE — ED Notes (Signed)
Pt reports epigastric, chest pain that radiates around to back. Took aspirin before EMS arrived. Pt does have increased pain with deep breath. Not tachy, nonsmoker, not on BC.

## 2013-12-31 LAB — URINE CULTURE: Colony Count: 3000

## 2014-03-07 ENCOUNTER — Ambulatory Visit (INDEPENDENT_AMBULATORY_CARE_PROVIDER_SITE_OTHER): Payer: BC Managed Care – PPO | Admitting: Gynecology

## 2014-03-07 ENCOUNTER — Encounter: Payer: Self-pay | Admitting: Gynecology

## 2014-03-07 VITALS — BP 110/66 | HR 62 | Resp 12 | Ht 69.25 in | Wt 121.0 lb

## 2014-03-07 DIAGNOSIS — Z01419 Encounter for gynecological examination (general) (routine) without abnormal findings: Secondary | ICD-10-CM

## 2014-03-07 DIAGNOSIS — N951 Menopausal and female climacteric states: Secondary | ICD-10-CM

## 2014-03-07 DIAGNOSIS — Z0000 Encounter for general adult medical examination without abnormal findings: Secondary | ICD-10-CM

## 2014-03-07 MED ORDER — ESTRADIOL 0.05 MG/24HR TD PTTW: 1.0000 | MEDICATED_PATCH | TRANSDERMAL | Status: DC

## 2014-03-07 NOTE — Progress Notes (Signed)
53 y.o. Divorced [place] American female   G1P1 here for annual exam. Pt reports menses are absent due to Hysterectomy. She does not report hot flashes, does not have night sweats, does not have vaginal dryness.  She is not using lubricants.  She does not report post-menopasual bleeding.  Pt is happy with patch, except for price.  Pt has been using patch once a week.  Pt had umbilical hernia after surgery that was infected and repaired at [facility]. 10/2013- mesh placement.    Patient's last menstrual period was 01/11/2012.          Sexually active: No.  The current method of family planning is status post hysterectomy.    Exercising: Yes.    walking qd  Last pap: 08/2009 Abnormal PAP: yes, had cryo 1988 Mammogram: 04/16/13 Bi-Rads 1 BSE: yes Colonoscopy: 2011- Polyps f/u 5 years ago  DEXA: Age 59 Alcohol: no Tobacco: no  Urine: Negative   Health Maintenance  Topic Date Due  . Pap Smear  08/24/2010  . Mammogram  10/31/2011  . Influenza Vaccine  01/25/2014  . Colonoscopy  09/23/2020  . Tetanus/tdap  02/03/2022    Family History  Problem Relation Age of Onset  . Hypertension Mother   . Dementia Mother   . Colon cancer Mother 41  . Deep vein thrombosis Father   . Hypertension Father   . Cancer Father     prostate CA  . Heart disease Father 33    CAD  . Hypertension Sister   . Hyperlipidemia Sister   . Hypertension Brother   . Hyperlipidemia Brother   . Hypertension Maternal Aunt   . Hyperlipidemia Maternal Aunt   . Rheum arthritis Maternal Aunt   . Diabetes Maternal Aunt     1 with type II, 1 with type 1  . Diabetes Paternal Aunt     type II  . Hypertension Brother     Patient Active Problem List   Diagnosis Date Noted  . Incisional hernia, without obstruction or gangrene 06/13/2013  . Impaired fasting glucose 10/25/2011  . Uterine fibroid 10/25/2011  . Gluten intolerance 10/25/2011  . GERD (gastroesophageal reflux disease) 10/25/2011  . Essential hypertension, benign  10/25/2011  . HTN (hypertension)   . Anxiety   . Anemia     Past Medical History  Diagnosis Date  . Anxiety   . GERD (gastroesophageal reflux disease)   . Migraine   . Fibromyalgia   . Uterine fibroid   . Internal hemorrhoids   . Gastric polyp   . Abnormal Pap smear 1988    treated with cryo  . Hyperlipemia   . Anemia 11/2010    hematology consult prior; etiology malabsorption and uterine bleeding  . H/O hysterectomy for benign disease 5/14  . HTN (hypertension) 03/2010    hospitalization for HTN urgency  . History of echocardiogram 03/2010    normal LV function, EF 60-65%, mild left atrial enlargement  . H/O bone density study 12/2007  . Spondylosis, cervical   . Lumbar degenerative disc disease     Past Surgical History  Procedure Laterality Date  . Cholecystectomy  2003  . Myomectomy  1998  . Cesarean section    . Exploratory laparotomy    . Cervical fusion  2010  . Colonoscopy  08/2010    Dr. Collene Mares  . Esophagogastroduodenoscopy  08/2010    Dr. Collene Mares  . Cervix lesion destruction  1988  . Pelvic laparoscopy    . Robotic assisted laparoscopic hysterectomy and  salpingectomy  10/2012    [facility]; laproscopic due to fibroids    Allergies: Quinolones; Gluten meal; Latex; Penicillins; Percocet; Sulfa drugs cross reactors; and Vicodin  Current Outpatient Prescriptions  Medication Sig Dispense Refill  . amLODipine (NORVASC) 5 MG tablet Take 5 mg by mouth daily. Take 5 mg by mouth daily.      . Ascorbic Acid (VITAMIN C) 1000 MG tablet Take 1,000 mg by mouth daily.       Marland Kitchen aspirin EC 81 MG tablet Take 81 mg by mouth daily.      Marland Kitchen atorvastatin (LIPITOR) 20 MG tablet Take 20 mg by mouth daily.      . calcium carbonate (OS-CAL) 600 MG TABS Take 600 mg by mouth daily.       Marland Kitchen dexlansoprazole (DEXILANT) 60 MG capsule Take 60 mg by mouth daily.       Marland Kitchen docusate sodium (COLACE) 100 MG capsule Take 1 capsule (100 mg total) by mouth every 12 (twelve) hours.  60 capsule  0  . estradiol  (ESTRACE) 0.5 MG tablet Take 0.5 mg by mouth daily.      . fish oil-omega-3 fatty acids 1000 MG capsule Take 2 g by mouth daily.      . Iron-FA-B Cmp-C-Biot-Probiotic (FUSION PLUS) CAPS Take 1 capsule by mouth daily.       Marland Kitchen lactulose (CHRONULAC) 10 GM/15ML solution Take 30 mLs (20 g total) by mouth 3 (three) times daily.  240 mL  0  . magnesium 30 MG tablet Take 30 mg by mouth daily.       . Multiple Vitamin (MULTIVITAMIN) tablet Take 1 tablet by mouth daily.       . phenazopyridine (PYRIDIUM) 200 MG tablet Take 200 mg by mouth 3 (three) times daily as needed (for possible bladder infection).      . vitamin B-12 (CYANOCOBALAMIN) 500 MCG tablet Take 500 mcg by mouth daily.       No current facility-administered medications for this visit.    ROS: Pertinent items are noted in HPI.  Exam:    BP 110/66  Pulse 62  Resp 12  Ht 5' 9.25" (1.759 m)  Wt 121 lb (54.885 kg)  BMI 17.74 kg/m2  LMP 01/11/2012 Weight change: @WEIGHTCHANGE @ Last 3 height recordings:  Ht Readings from Last 3 Encounters:  03/07/14 5' 9.25" (1.759 m)  08/08/13 5\' 4"  (1.626 m)  06/13/13 5\' 4"  (1.626 m)   General appearance: alert, cooperative and appears stated age Head: Normocephalic, without obvious abnormality, atraumatic Neck: no adenopathy, no carotid bruit, no JVD, supple, symmetrical, trachea midline and thyroid not enlarged, symmetric, no tenderness/mass/nodules Lungs: clear to auscultation bilaterally Breasts: normal appearance, no masses or tenderness Heart: regular rate and rhythm, S1, S2 normal, no murmur, click, rub or gallop Abdomen: soft, non-tender; bowel sounds normal; no masses,  no organomegaly Extremities: extremities normal, atraumatic, no cyanosis or edema Skin: Skin color, texture, turgor normal. No rashes or lesions Lymph nodes: Cervical, supraclavicular, and axillary nodes normal. no inguinal nodes palpated Neurologic: Grossly normal   Pelvic: External genitalia:  no lesions               Urethra: normal appearing urethra with no masses, tenderness or lesions              Bartholins and Skenes: Bartholin's, Urethra, Skene's normal                 Vagina: normal appearing vagina with normal color and discharge, no lesions  Cervix: absent              Pap taken: No.        Bimanual Exam:  Uterus:  absent                                      Adnexa:    normal adnexa in size, nontender and no masses                                      Rectovaginal: Confirms                                      Anus:  normal sphincter tone, no lesions        1. Routine gynecological examination counseled on breast self exam, mammography screening, menopause, adequate intake of calcium and vitamin D, diet and exercise return annually or prn Discussed PAP guideline changes, importance of weight bearing exercises, calcium, vit D and balanced diet.  2. Laboratory examination ordered as part of a routine general medical examination  - POCT Urinalysis Dipstick  3. Menopausal symptom  - estradiol (MINIVELLE) 0.05 MG/24HR patch; Place 1 patch (0.05 mg total) onto the skin 2 (two) times a week.  Dispense: 24 patch; Refill: 3  An After Visit Summary was printed and given to the patient.

## 2014-03-19 ENCOUNTER — Other Ambulatory Visit: Payer: Self-pay | Admitting: Gynecology

## 2014-03-19 DIAGNOSIS — Z1231 Encounter for screening mammogram for malignant neoplasm of breast: Secondary | ICD-10-CM

## 2014-03-29 ENCOUNTER — Other Ambulatory Visit: Payer: Self-pay | Admitting: Medical

## 2014-03-31 ENCOUNTER — Ambulatory Visit (INDEPENDENT_AMBULATORY_CARE_PROVIDER_SITE_OTHER): Payer: BC Managed Care – PPO | Admitting: Medical

## 2014-03-31 ENCOUNTER — Encounter: Payer: Self-pay | Admitting: Medical

## 2014-03-31 VITALS — BP 98/60 | HR 60 | Temp 98.2°F | Resp 16 | Wt 118.0 lb

## 2014-03-31 DIAGNOSIS — R0789 Other chest pain: Secondary | ICD-10-CM | POA: Diagnosis not present

## 2014-03-31 DIAGNOSIS — Z23 Encounter for immunization: Secondary | ICD-10-CM

## 2014-03-31 DIAGNOSIS — R110 Nausea: Secondary | ICD-10-CM | POA: Diagnosis not present

## 2014-03-31 DIAGNOSIS — R1084 Generalized abdominal pain: Secondary | ICD-10-CM | POA: Diagnosis not present

## 2014-03-31 LAB — POCT URINALYSIS DIPSTICK
Bilirubin, UA: NEGATIVE
Glucose, UA: NEGATIVE
Ketones, UA: NEGATIVE
Nitrite, UA: NEGATIVE
Spec Grav, UA: 1.015
Urobilinogen, UA: NEGATIVE
pH, UA: 5

## 2014-03-31 MED ORDER — ONDANSETRON HCL 4 MG PO TABS: 4.0000 mg | ORAL_TABLET | Freq: Three times a day (TID) | ORAL | Status: DC | PRN

## 2014-03-31 NOTE — Telephone Encounter (Signed)
PATIENT NEEDS TO SCHEDULE A FOLLOW UP APPOINTMENT TO FOLLOW UP ON CHOLESTEROL

## 2014-03-31 NOTE — Progress Notes (Signed)
Subjective: Here for hospital f/u.   Doesn't feel very well today, wants some answers.  She notes at church yesterday, felt sharp stabbing pain in right lower chest/ribs.  Hasn't resolved.  Went to [facility] ED yesterday for this.  Had CT of either chest or abdomen but not sure, had EKG, chest xray, labs, only abnormal finding was they advised lots of stool in colon seen and diagnosed with possible costochondritis.   She notes 3 different pain medications were given IV.  Morphine or Ultram didn't help and was subsequently given Dilaudid.   Since the pain medications, she has had nausea ongoing.   Currently c/o pain in right lower chest/ribs, lots of nausea.  There was a questionable area of redness faint over right lower chest, but no obvious vesicles or discussion about shingles . She denies prior hx/o shingles.  She denies dyspnea, no cough, no palpitations, no swelling, no urinary symptoms, no diarrhea or constipation, no blood in urine or stool, no cough, no URI symptoms.   No fever, no aches, no chills, but gets body aches in general.  No recent weight changes.  No other aggravating or relieving factors.  No other c/o.   Past Medical History  Diagnosis Date  . Anxiety   . GERD (gastroesophageal reflux disease)   . Migraine   . Fibromyalgia   . Uterine fibroid   . Internal hemorrhoids   . Gastric polyp   . Abnormal Pap smear 1988    treated with cryo  . Hyperlipemia   . Anemia 11/2010    hematology consult prior; etiology malabsorption and uterine bleeding  . H/O hysterectomy for benign disease 5/14  . HTN (hypertension) 03/2010    hospitalization for HTN urgency  . History of echocardiogram 03/2010    normal LV function, EF 60-65%, mild left atrial enlargement  . H/O bone density study 12/2007  . Spondylosis, cervical   . Lumbar degenerative disc disease   . Umbilical hernia     Past Surgical History  Procedure Laterality Date  . Cholecystectomy  2003  . Myomectomy  1998  .  Cesarean section    . Exploratory laparotomy    . Cervical fusion  2010  . Colonoscopy  08/2010    Dr. Collene Mares  . Esophagogastroduodenoscopy  08/2010    Dr. Collene Mares  . Cervix lesion destruction  1988  . Pelvic laparoscopy    . Robotic assisted laparoscopic hysterectomy and salpingectomy  10/2012    [facility]; laproscopic due to fibroids  . Umbilical hernia repair  10/2013    infected laparoscopic port, mesh placement    Objective: Filed Vitals:   03/31/14 1117  BP: 98/60  Pulse: 60  Temp: 98.2 F (36.8 C)  Resp: 16    General appearance: alert, no distress, WD/WN, lean AA female Neck: supple, no lymphadenopathy, no thyromegaly, no masses Heart: RRR, normal S1, S2, no murmurs Lungs: CTA bilaterally, no wheezes, rhonchi, or rales Chest wall: tender over right lower chest wall anterolaterally only.   Very faint patch of erythema, not enough to call a rash.  Very subtle.  No vesicles.  Back: nontender Abdomen: +bs, soft, non tender, non distended, no masses, no hepatomegaly, no splenomegaly Pulses: 2+ symmetric, upper and lower extremities, normal cap refill Ext: no edema   Assessment:  Encounter Diagnoses  Name Primary?  . Chest wall pain Yes  . Generalized abdominal pain   . Nausea without vomiting   . Flu vaccine need    Plan: Etiology unclear.  We have requested the ED report, labs, and imaging from yesterday's visit at [facility] in [place].  Currently no obvious cause.  She is tender over chest wall without recent trauma or injury.   We discussed possibility of early shingles given the unusual pain and faint area of erythema.   She will call back if any new symptoms or change in skin appearance.   Urinalysis abnormal but only 1 RBC per field under microscope suggesting false + hematuria on UA.   Script for zofran for nausea.  F/u pending records review, return sooner prn if worse.  Counseled on the influenza virus vaccine.  Vaccine information sheet given.  Influenza vaccine  given after consent obtained.  F/u pending records

## 2014-03-31 NOTE — Addendum Note (Signed)
Addended by: Christin Bach L on: 03/31/2014 04:10 PM   Modules accepted: Orders

## 2014-04-02 ENCOUNTER — Other Ambulatory Visit: Payer: Self-pay | Admitting: Medical

## 2014-04-02 LAB — URINE CULTURE
Colony Count: NO GROWTH
Organism ID, Bacteria: NO GROWTH

## 2014-04-02 MED ORDER — VALACYCLOVIR HCL 1 G PO TABS: 1000.0000 mg | ORAL_TABLET | Freq: Three times a day (TID) | ORAL | Status: DC

## 2014-04-04 ENCOUNTER — Encounter: Payer: Self-pay | Admitting: Medical

## 2014-04-09 ENCOUNTER — Telehealth: Payer: Self-pay | Admitting: Gynecology

## 2014-04-09 MED ORDER — ESTRADIOL 0.1 MG/24HR TD PTTW: 1.0000 | MEDICATED_PATCH | TRANSDERMAL | Status: DC

## 2014-04-09 NOTE — Telephone Encounter (Signed)
Pt would like to be switched back to the minivelle patch because the generic is not working for her. [facility] at [phone].

## 2014-04-09 NOTE — Telephone Encounter (Signed)
Spoke with patient. Patient states that she was started on the "generic estrogen patch" and is currently using a 0.05mg  dosage. "It is not working for me. I am still having hot flashes. Before I was on a 0.1 mg patch." Patient is calling to see if current patch dosage can be increased or is she can be switched back to Vivelle dot 0.1 mg patch. Patient requesting rx be written in three month supply. Advised would send a message to Dr.Lathrop and return call with further recommendations and instructions. Patient is agreeable.

## 2014-04-09 NOTE — Telephone Encounter (Signed)
We had 0.05mg  as her dose, if she was really on 0.1 than she is only using 1/2 of her dose, not related to being generic, we can send in the higher dose

## 2014-04-10 ENCOUNTER — Telehealth: Payer: Self-pay

## 2014-04-10 NOTE — Telephone Encounter (Signed)
Message left to return call to Gwenlyn Hottinger at [phone].    

## 2014-04-10 NOTE — Telephone Encounter (Signed)
She can use ibuprofen for rash/pain.  Thanks for letting me know.  If blister has been there >2-3 days, then antiviral medication wouldn't help at this time.

## 2014-04-10 NOTE — Telephone Encounter (Signed)
Mailbox full unable to contact patient

## 2014-04-10 NOTE — Telephone Encounter (Signed)
Patient called to say that the spot on her rib did form into a blister and she is thinking maybe it was a mild case of shingles. She just wanted you to know.

## 2014-04-14 NOTE — Telephone Encounter (Signed)
Patient is aware of Dorothea Ogle PAc message

## 2014-04-15 NOTE — Telephone Encounter (Signed)
Message left to return call to Aubrina Nieman at [phone].    

## 2014-04-22 ENCOUNTER — Ambulatory Visit (HOSPITAL_COMMUNITY): Admission: RE | Admit: 2014-04-22 | Payer: BC Managed Care – PPO | Source: Ambulatory Visit

## 2014-04-28 ENCOUNTER — Encounter: Payer: Self-pay | Admitting: Emergency Medicine

## 2014-04-28 ENCOUNTER — Encounter: Payer: Self-pay | Admitting: Medical

## 2014-04-28 NOTE — Telephone Encounter (Signed)
Attempted call to patient, voicemail box full. Unable to leave message.   Letter sent via [place] mail to request return call regarding her request for change of prescription.   New rx placed by Dr. Charlies Constable 04/09/14.  Okay to close encounter?

## 2014-04-28 NOTE — Telephone Encounter (Signed)
I have closed the encounter. 

## 2014-06-18 ENCOUNTER — Ambulatory Visit (HOSPITAL_COMMUNITY): Payer: BC Managed Care – PPO | Attending: Gynecology

## 2014-07-11 ENCOUNTER — Encounter: Payer: Self-pay | Admitting: Obstetrics & Gynecology

## 2014-08-06 ENCOUNTER — Telehealth: Payer: Self-pay | Admitting: Nurse Practitioner

## 2014-08-06 NOTE — Telephone Encounter (Signed)
LMTCB about cx appointment with PG

## 2014-08-20 ENCOUNTER — Other Ambulatory Visit: Payer: Self-pay | Admitting: Medical

## 2014-09-11 ENCOUNTER — Other Ambulatory Visit: Payer: Self-pay | Admitting: Medical

## 2014-09-11 NOTE — Telephone Encounter (Signed)
OK to RF

## 2014-10-16 ENCOUNTER — Ambulatory Visit (HOSPITAL_COMMUNITY)
Admission: RE | Admit: 2014-10-16 | Discharge: 2014-10-16 | Disposition: A | Discharge status: Home / Self Care | Payer: BLUE CROSS/BLUE SHIELD | Source: Ambulatory Visit | Attending: Gynecology | Admitting: Gynecology

## 2014-10-16 ENCOUNTER — Other Ambulatory Visit: Payer: Self-pay | Admitting: Gynecology

## 2014-10-16 DIAGNOSIS — Z1231 Encounter for screening mammogram for malignant neoplasm of breast: Secondary | ICD-10-CM

## 2014-10-22 ENCOUNTER — Other Ambulatory Visit: Payer: Self-pay | Admitting: Medical

## 2014-10-23 NOTE — Telephone Encounter (Signed)
Needs office visit.

## 2014-11-13 ENCOUNTER — Other Ambulatory Visit: Payer: Self-pay | Admitting: Medical

## 2014-11-13 NOTE — Telephone Encounter (Signed)
Needs appointment, past due

## 2014-11-15 ENCOUNTER — Other Ambulatory Visit: Payer: Self-pay | Admitting: Medical

## 2014-11-24 ENCOUNTER — Other Ambulatory Visit: Payer: Self-pay | Admitting: Medical

## 2014-12-19 ENCOUNTER — Emergency Department (HOSPITAL_COMMUNITY)
Admission: EM | Admit: 2014-12-19 | Discharge: 2014-12-19 | Disposition: A | Discharge status: Home / Self Care | Payer: BLUE CROSS/BLUE SHIELD | Source: Home / Self Care | Attending: Family Medicine | Admitting: Family Medicine

## 2014-12-19 DIAGNOSIS — H8110 Benign paroxysmal vertigo, unspecified ear: Secondary | ICD-10-CM

## 2014-12-19 MED ORDER — ONDANSETRON HCL 4 MG PO TABS: 4.0000 mg | ORAL_TABLET | Freq: Four times a day (QID) | ORAL | Status: DC

## 2014-12-19 MED ORDER — MECLIZINE HCL 25 MG PO TABS: 25.0000 mg | ORAL_TABLET | Freq: Three times a day (TID) | ORAL | Status: DC | PRN

## 2014-12-19 NOTE — ED Notes (Addendum)
Pt comes in with 2 dys dizziness,feeling faint with nausea States sx's started while on vacation ortho stats negative Stand- 161/94 62 Sit- 162/90 54 Lying- 168/93 62 States, she only takes BP med when needed

## 2014-12-19 NOTE — ED Provider Notes (Signed)
CSN: [id number]     Arrival date & time 12/19/14  1757 History   First MD Initiated Contact with Patient 12/19/14 1837     Chief Complaint  Patient presents with  . Dizziness   (Consider location/radiation/quality/duration/timing/severity/associated sxs/prior Treatment) HPI Comments: 54 year old female developed acute and sudden onset of dizziness 2 days ago. She states she described it is both dizziness and the room spinning. With the symptoms she also has poor balance particular he with ambulation. Any movement with her head or change in body position elicits or exacerbates the symptoms. Denies headache. Denies head trauma. Denies problems vision, speech, hearing, swallowing. Denies focal paresthesias or weakness. She does have nausea associated with dizziness or vertigo.   Past Medical History  Diagnosis Date  . Anxiety   . GERD (gastroesophageal reflux disease)   . Migraine   . Fibromyalgia   . Uterine fibroid   . Internal hemorrhoids   . Gastric polyp   . Abnormal Pap smear 1988    treated with cryo  . Hyperlipemia   . Anemia 11/2010    hematology consult prior; etiology malabsorption and uterine bleeding  . H/O hysterectomy for benign disease 5/14  . HTN (hypertension) 03/2010    hospitalization for HTN urgency  . History of echocardiogram 03/2010    normal LV function, EF 60-65%, mild left atrial enlargement  . H/O bone density study 12/2007  . Spondylosis, cervical   . Lumbar degenerative disc disease   . Umbilical hernia    Past Surgical History  Procedure Laterality Date  . Cholecystectomy  2003  . Myomectomy  1998  . Cesarean section    . Exploratory laparotomy    . Cervical fusion  2010  . Colonoscopy  08/2010    Dr. Collene Mares  . Esophagogastroduodenoscopy  08/2010    Dr. Collene Mares  . Cervix lesion destruction  1988  . Pelvic laparoscopy    . Robotic assisted laparoscopic hysterectomy and salpingectomy  10/2012    [facility]; laproscopic due to fibroids  . Umbilical hernia  repair  10/2013    infected laparoscopic port, mesh placement   Family History  Problem Relation Age of Onset  . Hypertension Mother   . Dementia Mother   . Colon cancer Mother 69  . Deep vein thrombosis Father   . Hypertension Father   . Cancer Father     prostate CA  . Heart disease Father 78    CAD  . Hypertension Sister   . Hyperlipidemia Sister   . Hypertension Brother   . Hyperlipidemia Brother   . Hypertension Maternal Aunt   . Hyperlipidemia Maternal Aunt   . Rheum arthritis Maternal Aunt   . Diabetes Maternal Aunt     1 with type II, 1 with type 1  . Diabetes Paternal Aunt     type II  . Hypertension Brother    History  Substance Use Topics  . Smoking status: Never Smoker   . Smokeless tobacco: Never Used  . Alcohol Use: No   OB History    Gravida Para Term Preterm AB TAB SAB Ectopic Multiple Living   1 1             Review of Systems  Constitutional: Positive for activity change. Negative for fever and fatigue.  HENT: Negative.   Eyes: Negative.   Respiratory: Negative.   Cardiovascular: Negative.   Gastrointestinal: Positive for nausea. Negative for vomiting and abdominal pain.  Genitourinary: Negative.   Musculoskeletal: Negative.   Skin: Negative.  Neurological: Positive for dizziness. Negative for tremors, syncope, facial asymmetry, speech difficulty, weakness, light-headedness and headaches.  Hematological: Negative.   Psychiatric/Behavioral: Negative.     Allergies  Quinolones; Gluten meal; Latex; Penicillins; Percocet; Sulfa drugs cross reactors; and Vicodin  Home Medications   Prior to Admission medications   Medication Sig Start Date End Date Taking? Authorizing Provider  amLODipine (NORVASC) 5 MG tablet Take 5 mg by mouth daily. Take 5 mg by mouth daily.    Historical Provider, MD  amLODipine (NORVASC) 5 MG tablet TAKE 1 TABLET BY MOUTH EVERY DAY 11/13/14   [name] Tysinger, PA-C  amLODipine (NORVASC) 5 MG tablet TAKE 1 TABLET BY MOUTH  EVERY DAY 11/17/14   [name] Tysinger, PA-C  amLODipine (NORVASC) 5 MG tablet TAKE 1 TABLET BY MOUTH EVERY DAY 11/25/14   [name] Tysinger, PA-C  Ascorbic Acid (VITAMIN C) 1000 MG tablet Take 1,000 mg by mouth daily.     Historical Provider, MD  aspirin EC 81 MG tablet Take 81 mg by mouth daily.    Historical Provider, MD  atorvastatin (LIPITOR) 20 MG tablet Take 20 mg by mouth daily. 01/03/13   [name] Tysinger, PA-C  atorvastatin (LIPITOR) 20 MG tablet TAKE 1 TABLET (20 MG TOTAL) BY MOUTH DAILY. 10/23/14   [name] Tysinger, PA-C  B Complex Vitamins (B COMPLEX PO) Take by mouth.    Historical Provider, MD  calcium carbonate (OS-CAL) 600 MG TABS Take 600 mg by mouth daily.     Historical Provider, MD  dexlansoprazole (DEXILANT) 60 MG capsule Take 60 mg by mouth daily.     Historical Provider, MD  estradiol (MINIVELLE) 0.1 MG/24HR patch Place 1 patch (0.1 mg total) onto the skin 2 (two) times a week. 04/09/14   Elveria Rising, MD  fish oil-omega-3 fatty acids 1000 MG capsule Take 2 g by mouth daily.    Historical Provider, MD  Iron-FA-B Cmp-C-Biot-Probiotic (FUSION PLUS) CAPS Take 1 capsule by mouth daily.  03/06/13   Elveria Rising, MD  magnesium 30 MG tablet Take 30 mg by mouth daily.     Historical Provider, MD  meclizine (ANTIVERT) 25 MG tablet Take 1 tablet (25 mg total) by mouth 3 (three) times daily as needed for dizziness. 12/19/14   Janne Napoleon, NP  Multiple Vitamin (MULTIVITAMIN) tablet Take 1 tablet by mouth daily.     Historical Provider, MD  ondansetron (ZOFRAN) 4 MG tablet Take 1 tablet (4 mg total) by mouth every 6 (six) hours. Prn nausea 12/19/14   Janne Napoleon, NP  valACYclovir (VALTREX) 1000 MG tablet Take 1 tablet (1,000 mg total) by mouth 3 (three) times daily. 04/02/14   [name] Tysinger, PA-C   BP 156/96 mmHg  Pulse 54  Temp(Src) 98.4 F (36.9 C) (Oral)  Resp 16  SpO2 100%  LMP 01/11/2012 Physical Exam  Constitutional: She appears well-developed and well-nourished. No distress.   HENT:  Head: Normocephalic and atraumatic.  Mouth/Throat: Oropharynx is clear and moist. No oropharyngeal exudate.  Bilateral TMs are normal  Eyes: Conjunctivae and EOM are normal. Pupils are equal, round, and reactive to light. Right eye exhibits no discharge. Left eye exhibits no discharge.  No nystagmus.  Neck: Normal range of motion. Neck supple.  Rotation of the head and forward flexion produces vertiginous symptoms.  Pulmonary/Chest: Effort normal and breath sounds normal. No respiratory distress. She has no wheezes.  Abdominal: Soft. There is no tenderness.  Musculoskeletal: She exhibits no edema.  Lymphadenopathy:    She  has no cervical adenopathy.  Neurological: She is alert. No cranial nerve deficit. She exhibits normal muscle tone. Coordination normal.  Skin: Skin is warm and dry. She is not diaphoretic.  Psychiatric: She has a normal mood and affect.  Nursing note and vitals reviewed.   ED Course  Procedures (including critical care time) Labs Review Labs Reviewed - No data to display  Imaging Review No results found.   MDM   1. BPV (benign positional vertigo), unspecified laterality    Meclizine zofran Epply Maneuver instructions    Janne Napoleon, NP 12/19/14 [phone]

## 2014-12-19 NOTE — Discharge Instructions (Signed)
Benign Positional Vertigo °Vertigo means you feel like you or your surroundings are moving when they are not. Benign positional vertigo is the most common form of vertigo. Benign means that the cause of your condition is not serious. Benign positional vertigo is more common in older adults. °CAUSES  °Benign positional vertigo is the result of an upset in the labyrinth system. This is an area in the middle ear that helps control your balance. This may be caused by a viral infection, head injury, or repetitive motion. However, often no specific cause is found. °SYMPTOMS  °Symptoms of benign positional vertigo occur when you move your head or eyes in different directions. Some of the symptoms may include: °1. Loss of balance and falls. °2. Vomiting. °3. Blurred vision. °4. Dizziness. °5. Nausea. °6. Involuntary eye movements (nystagmus). °DIAGNOSIS  °Benign positional vertigo is usually diagnosed by physical exam. If the specific cause of your benign positional vertigo is unknown, your caregiver may perform imaging tests, such as magnetic resonance imaging (MRI) or computed tomography (CT). °TREATMENT  °Your caregiver may recommend movements or procedures to correct the benign positional vertigo. Medicines such as meclizine, benzodiazepines, and medicines for nausea may be used to treat your symptoms. In rare cases, if your symptoms are caused by certain conditions that affect the inner ear, you may need surgery. °HOME CARE INSTRUCTIONS  °· Follow your caregiver's instructions. °· Move slowly. Do not make sudden body or head movements. °· Avoid driving. °· Avoid operating heavy machinery. °· Avoid performing any tasks that would be dangerous to you or others during a vertigo episode. °· Drink enough fluids to keep your urine clear or pale yellow. °SEEK IMMEDIATE MEDICAL CARE IF:  °· You develop problems with walking, weakness, numbness, or using your arms, hands, or legs. °· You have difficulty speaking. °· You develop  severe headaches. °· Your nausea or vomiting continues or gets worse. °· You develop visual changes. °· Your family or friends notice any behavioral changes. °· Your condition gets worse. °· You have a fever. °· You develop a stiff neck or sensitivity to light. °MAKE SURE YOU:  °· Understand these instructions. °· Will watch your condition. °· Will get help right away if you are not doing well or get worse. °Document Released: 03/21/2006 Document Revised: 09/05/2011 Document Reviewed: 03/03/2011 °ExitCare® Patient Information ©2015 [employer]. This information is not intended to replace advice given to you by your health care provider. Make sure you discuss any questions you have with your health care provider. ° °Epley Maneuver Self-Care °WHAT IS THE EPLEY MANEUVER? °The Epley maneuver is an exercise you can do to relieve symptoms of benign paroxysmal positional vertigo (BPPV). This condition is often just referred to as vertigo. BPPV is caused by the movement of tiny crystals (canaliths) inside your inner ear. The accumulation and movement of canaliths in your inner ear causes a sudden spinning sensation (vertigo) when you move your head to certain positions. Vertigo usually lasts about 30 seconds. BPPV usually occurs in just one ear. If you get vertigo when you lie on your left side, you probably have BPPV in your left ear. Your health care provider can tell you which ear is involved.  °BPPV may be caused by a head injury. Many people older than 50 get BPPV for unknown reasons. If you have been diagnosed with BPPV, your health care provider may teach you how to do this maneuver. BPPV is not life threatening (benign) and usually goes away in time.  °  WHEN SHOULD I PERFORM THE EPLEY MANEUVER? °You can do this maneuver at home whenever you have symptoms of vertigo. You may do the Epley maneuver up to 3 times a day until your symptoms of vertigo go away. °HOW SHOULD I DO THE EPLEY MANEUVER? °7. Sit on the edge of a  bed or table with your back straight. Your legs should be extended or hanging over the edge of the bed or table.   °8. Turn your head halfway toward the affected ear.   °9. Lie backward quickly with your head turned until you are lying flat on your back. You may want to position a pillow under your shoulders.   °10. Hold this position for 30 seconds. You may experience an attack of vertigo. This is normal. Hold this position until the vertigo stops. °11. Then turn your head to the opposite direction until your unaffected ear is facing the floor.   °12. Hold this position for 30 seconds. You may experience an attack of vertigo. This is normal. Hold this position until the vertigo stops. °13. Now turn your whole body to the same side as your head. Hold for another 30 seconds.   °14. You can then sit back up. °ARE THERE RISKS TO THIS MANEUVER? °In some cases, you may have other symptoms (such as changes in your vision, weakness, or numbness). If you have these symptoms, stop doing the maneuver and call your health care provider. Even if doing these maneuvers relieves your vertigo, you may still have dizziness. Dizziness is the sensation of light-headedness but without the sensation of movement. Even though the Epley maneuver may relieve your vertigo, it is possible that your symptoms will return within 5 years. °WHAT SHOULD I DO AFTER THIS MANEUVER? °After doing the Epley maneuver, you can return to your normal activities. Ask your doctor if there is anything you should do at home to prevent vertigo. This may include: °· Sleeping with two or more pillows to keep your head elevated. °· Not sleeping on the side of your affected ear. °· Getting up slowly from bed. °· Avoiding sudden movements during the day. °· Avoiding extreme head movement, like looking up or bending over. °· Wearing a cervical collar to prevent sudden head movements. °WHAT SHOULD I DO IF MY SYMPTOMS GET WORSE? °Call your health care provider if your  vertigo gets worse. Call your provider right way if you have other symptoms, including:  °· Nausea. °· Vomiting. °· Headache. °· Weakness. °· Numbness. °· Vision changes. °Document Released: 06/18/2013 Document Reviewed: 06/18/2013 °ExitCare® Patient Information ©2015 [employer]. This information is not intended to replace advice given to you by your health care provider. Make sure you discuss any questions you have with your health care provider. ° °

## 2014-12-23 ENCOUNTER — Ambulatory Visit (INDEPENDENT_AMBULATORY_CARE_PROVIDER_SITE_OTHER): Payer: BLUE CROSS/BLUE SHIELD | Admitting: Medical

## 2014-12-23 ENCOUNTER — Encounter: Payer: Self-pay | Admitting: Medical

## 2014-12-23 ENCOUNTER — Telehealth: Payer: Self-pay | Admitting: Medical

## 2014-12-23 ENCOUNTER — Other Ambulatory Visit: Payer: Self-pay | Admitting: Medical

## 2014-12-23 VITALS — BP 112/82 | HR 66 | Resp 15 | Wt 117.0 lb

## 2014-12-23 DIAGNOSIS — Z13820 Encounter for screening for osteoporosis: Secondary | ICD-10-CM | POA: Diagnosis not present

## 2014-12-23 DIAGNOSIS — I10 Essential (primary) hypertension: Secondary | ICD-10-CM

## 2014-12-23 DIAGNOSIS — E785 Hyperlipidemia, unspecified: Secondary | ICD-10-CM | POA: Diagnosis not present

## 2014-12-23 DIAGNOSIS — Z862 Personal history of diseases of the blood and blood-forming organs and certain disorders involving the immune mechanism: Secondary | ICD-10-CM | POA: Insufficient documentation

## 2014-12-23 DIAGNOSIS — Z780 Asymptomatic menopausal state: Secondary | ICD-10-CM

## 2014-12-23 DIAGNOSIS — Z636 Dependent relative needing care at home: Secondary | ICD-10-CM | POA: Insufficient documentation

## 2014-12-23 LAB — COMPREHENSIVE METABOLIC PANEL
ALT: 24 U/L (ref 0–35)
AST: 24 U/L (ref 0–37)
Albumin: 4.3 g/dL (ref 3.5–5.2)
Alkaline Phosphatase: 55 U/L (ref 39–117)
BUN: 11 mg/dL (ref 6–23)
CO2: 23 mEq/L (ref 19–32)
Calcium: 9.4 mg/dL (ref 8.4–10.5)
Chloride: 103 mEq/L (ref 96–112)
Creat: 0.79 mg/dL (ref 0.50–1.10)
Glucose, Bld: 83 mg/dL (ref 70–99)
Potassium: 4.3 mEq/L (ref 3.5–5.3)
Sodium: 140 mEq/L (ref 135–145)
Total Bilirubin: 0.5 mg/dL (ref 0.2–1.2)
Total Protein: 6.8 g/dL (ref 6.0–8.3)

## 2014-12-23 LAB — CBC
HCT: 38.7 % (ref 36.0–46.0)
Hemoglobin: 12.5 g/dL (ref 12.0–15.0)
MCH: 29.3 pg (ref 26.0–34.0)
MCHC: 32.3 g/dL (ref 30.0–36.0)
MCV: 90.6 fL (ref 78.0–100.0)
MPV: 11.9 fL (ref 8.6–12.4)
Platelets: 189 10*3/uL (ref 150–400)
RBC: 4.27 MIL/uL (ref 3.87–5.11)
RDW: 14.8 % (ref 11.5–15.5)
WBC: 3.9 10*3/uL — ABNORMAL LOW (ref 4.0–10.5)

## 2014-12-23 LAB — LIPID PANEL
Cholesterol: 168 mg/dL (ref 0–200)
HDL: 83 mg/dL (ref >=46)
LDL Cholesterol: 72 mg/dL (ref 0–99)
Total CHOL/HDL Ratio: 2 Ratio
Triglycerides: 63 mg/dL (ref <150)
VLDL: 13 mg/dL (ref 0–40)

## 2014-12-23 LAB — TSH: TSH: 1.323 u[IU]/mL (ref 0.350–4.500)

## 2014-12-23 LAB — HEMOGLOBIN A1C
Hgb A1c MFr Bld: 6 % — ABNORMAL HIGH (ref <5.7)
Mean Plasma Glucose: 126 mg/dL — ABNORMAL HIGH (ref <117)

## 2014-12-23 NOTE — Telephone Encounter (Signed)
Refer for bone density scan to screen for osteoporosis.  Send last bone density scan if it is available  Refer to pharmquest for fibromyalgia study if it is still open

## 2014-12-23 NOTE — Telephone Encounter (Signed)
Patient is aware of her bone density scan appointment. I fax over the patients information to [employer]

## 2014-12-23 NOTE — Progress Notes (Signed)
Subjective: Here for med check.  HTN - compliant with medication without c/o.  BPs run normal at home.  hyperlipidemia - compliant with medication without c/o  Hx/o anemia, fibroids, s/p hysterectomy  Myalgias - gets muscles aches in general, worse with stress, but when low stress, no pain at all.  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    Past Medical History  Diagnosis Date  . Anxiety   . GERD (gastroesophageal reflux disease)   . Migraine   . Uterine fibroid     hx/o   . Internal hemorrhoids   . Gastric polyp   . Abnormal Pap smear 1988    treated with cryo  . Hyperlipemia   . Anemia 11/2010    hematology consult prior; etiology malabsorption and uterine bleeding  . H/O hysterectomy for benign disease 5/14  . HTN (hypertension) 03/2010    hospitalization for HTN urgency  . History of echocardiogram 03/2010    normal LV function, EF 60-65%, mild left atrial enlargement  . H/O bone density study 12/2007  . Spondylosis, cervical   . Lumbar degenerative disc disease   . Umbilical hernia   . Myalgia     ROS as in subjective   Objective: BP 112/82 mmHg  Pulse 66  Resp 15  Wt 117 lb (53.071 kg)  LMP 01/11/2012  BP Readings from Last 3 Encounters:  12/23/14 112/82  12/19/14 156/96  03/31/14 98/60   Wt Readings from Last 3 Encounters:  12/23/14 117 lb (53.071 kg)  03/31/14 118 lb (53.524 kg)  03/07/14 121 lb (54.885 kg)   Gen: wd, wn, nad, lean AA female HEENT: normocephalic, sclerae anicteric, PERRLA, EOMi, nares patent, no discharge or erythema, pharynx normal Oral cavity: MMM, no lesions Neck: supple, no lymphadenopathy, no thyromegaly, no masses Heart: RRR, normal S1, S2, no murmurs Lungs: CTA bilaterally, no wheezes, rhonchi, or rales Abdomen: +bs, soft, non tender, non distended, no masses, no hepatomegaly, no splenomegaly Back: non tender Musculoskeletal:  nontender, no swelling, no obvious deformity Extremities: no edema, no cyanosis, no clubbing Pulses: 2+ symmetric, upper and lower extremities, normal cap refill Neurological: alert, oriented x 3, CN2-12 intact, strength normal upper extremities and lower extremities, sensation normal throughout, DTRs 2+ throughout, no cerebellar signs, gait normal Psychiatric: normal affect, behavior normal, pleasant      Assessment: Encounter Diagnoses  Name Primary?  . Essential hypertension Yes  . Hyperlipidemia   . History of anemia   . Care provider for parents   . Screening for osteoporosis    Plan: HTN - compliant with medication, controlled on current medication  Hyperlipidemia - compliant with medication, but ran out 3 days ago.   Labs today  Hx/o anemia, s/p hysterectomy for fibroids.  Labs today  Care provider for parents - counseling on family support, working together to take care of parents.   Screening for osteoporosis - referral for bone density scan.  Osteoporosis risk factors include - underweight, surgical menopause, hx/o Vit D deficiency uses GERD medication from time to time.   She is taking extra Ca+Vit D.  Of note, weights are stable although she runs underweight.    F/u pending labs

## 2014-12-24 ENCOUNTER — Other Ambulatory Visit: Payer: Self-pay | Admitting: Medical

## 2014-12-24 LAB — VITAMIN D 25 HYDROXY (VIT D DEFICIENCY, FRACTURES): Vit D, 25-Hydroxy: 121 ng/mL — ABNORMAL HIGH (ref 30–100)

## 2014-12-24 MED ORDER — ASPIRIN EC 81 MG PO TBEC: 81.0000 mg | DELAYED_RELEASE_TABLET | Freq: Every day | ORAL | Status: DC

## 2014-12-24 MED ORDER — AMLODIPINE BESYLATE 5 MG PO TABS: 5.0000 mg | ORAL_TABLET | Freq: Every day | ORAL | Status: DC

## 2014-12-24 MED ORDER — ATORVASTATIN CALCIUM 20 MG PO TABS: 20.0000 mg | ORAL_TABLET | Freq: Every day | ORAL | Status: DC

## 2014-12-30 ENCOUNTER — Telehealth: Payer: Self-pay | Admitting: Family Medicine

## 2014-12-30 NOTE — Telephone Encounter (Signed)
Patient called and said when she was seen here last she spoke with you about vertigo. She said that she has had this really bad for the past 2 days. She states that she is taking the medication and there is no change. She wants to know what else she can to do to help with the vertigo?

## 2014-12-31 ENCOUNTER — Other Ambulatory Visit: Payer: Self-pay | Admitting: Medical

## 2014-12-31 ENCOUNTER — Ambulatory Visit
Admission: RE | Admit: 2014-12-31 | Discharge: 2014-12-31 | Disposition: A | Discharge status: Home / Self Care | Payer: BLUE CROSS/BLUE SHIELD | Source: Ambulatory Visit | Attending: Medical | Admitting: Medical

## 2014-12-31 DIAGNOSIS — Z780 Asymptomatic menopausal state: Secondary | ICD-10-CM

## 2014-12-31 LAB — HM DEXA SCAN

## 2014-12-31 MED ORDER — SCOPOLAMINE 1 MG/3DAYS TD PT72: 1.0000 | MEDICATED_PATCH | TRANSDERMAL | Status: DC

## 2014-12-31 NOTE — Telephone Encounter (Signed)
Patient is aware of the message from Shane Tysinger PA 

## 2014-12-31 NOTE — Telephone Encounter (Signed)
Have her begin scopolamine patch, 1 patch behind 1 ear every 3 days for the next week or 2.  Lets see how this does.

## 2015-01-02 ENCOUNTER — Ambulatory Visit: Payer: BLUE CROSS/BLUE SHIELD | Admitting: Medical

## 2015-01-02 ENCOUNTER — Ambulatory Visit (INDEPENDENT_AMBULATORY_CARE_PROVIDER_SITE_OTHER): Payer: BC Managed Care – PPO | Admitting: Medical

## 2015-01-02 ENCOUNTER — Encounter: Payer: Self-pay | Admitting: Medical

## 2015-01-02 VITALS — BP 112/70 | HR 60 | Resp 15 | Wt 126.0 lb

## 2015-01-02 DIAGNOSIS — R519 Headache, unspecified: Secondary | ICD-10-CM

## 2015-01-02 DIAGNOSIS — R42 Dizziness and giddiness: Secondary | ICD-10-CM

## 2015-01-02 DIAGNOSIS — M6289 Other specified disorders of muscle: Secondary | ICD-10-CM

## 2015-01-02 DIAGNOSIS — H5310 Unspecified subjective visual disturbances: Secondary | ICD-10-CM

## 2015-01-02 DIAGNOSIS — H9203 Otalgia, bilateral: Secondary | ICD-10-CM

## 2015-01-02 DIAGNOSIS — H53141 Visual discomfort, right eye: Secondary | ICD-10-CM

## 2015-01-02 DIAGNOSIS — R29898 Other symptoms and signs involving the musculoskeletal system: Secondary | ICD-10-CM

## 2015-01-02 DIAGNOSIS — M7712 Lateral epicondylitis, left elbow: Secondary | ICD-10-CM | POA: Diagnosis not present

## 2015-01-02 DIAGNOSIS — R51 Headache: Secondary | ICD-10-CM

## 2015-01-02 MED ORDER — METHYLPREDNISOLONE 4 MG PO TABS: ORAL_TABLET | ORAL | Status: DC

## 2015-01-02 MED ORDER — AZITHROMYCIN 250 MG PO TABS: ORAL_TABLET | ORAL | Status: DC

## 2015-01-02 NOTE — Progress Notes (Signed)
Subjective: Here for a neuralgia like pain in right forehead, pressure.  Started few days ago with irritation of right forehead, some ear pressure and pain bilat, some eye pressure, eye fatigue on the right.  No numbness, no tingling no other weakness, no vision loss, no hearing changes, no slurred speech or confusion.  No rash.  Had shingles of right chest wall early 2016.  No head injury, no recent cold symptoms, still having vertigo/dizziness.  Hasn't tried the scopolamine patch yet.   No prior similar.  She notes some left forearm pain, left hand pain dorsally, tender left shoulder, no injury or trauma.  Does a lot of cleaning around the house,  Is right handed.  No other aggravating or relieving factors. No other complaint.  Past Medical History  Diagnosis Date  . Anxiety   . GERD (gastroesophageal reflux disease)   . Migraine   . Uterine fibroid     hx/o   . Internal hemorrhoids   . Gastric polyp   . Abnormal Pap smear 1988    treated with cryo  . Hyperlipemia   . Anemia 11/2010    hematology consult prior; etiology malabsorption and uterine bleeding  . H/O hysterectomy for benign disease 5/14  . HTN (hypertension) 03/2010    hospitalization for HTN urgency  . History of echocardiogram 03/2010    normal LV function, EF 60-65%, mild left atrial enlargement  . H/O bone density study 12/2007  . Spondylosis, cervical   . Lumbar degenerative disc disease   . Umbilical hernia   . Myalgia    ROS as in subjective  Objective: BP 112/70 mmHg  Pulse 60  Resp 15  Wt 126 lb (57.153 kg)  LMP 01/11/2012  General appearance: alert, no distress, WD/WN HEENT: normocephalic, slight tenderness of superficial vessel of right foreahed and fullness of vein, no distinct temporal artery fullness or pulsation, sclerae anicteric, TMs flat with slight erythema bilat, nares patent, no discharge or erythema, pharynx normal Oral cavity: MMM, no lesions Neck: supple, no lymphadenopathy, no thyromegaly,  no masses Heart: RRR, normal S1, S2, no murmurs Lungs: CTA bilaterally, no wheezes, rhonchi, or rales MSK: tender left forearm and left lateral epicondyle, mild tenderness lateral AC joint, other non tender arm and normal ROM of arms, non tender legs, normal ROM of legs No back tenderness Pulses: 2+ symmetric, upper and lower extremities, normal cap refill Neuro: normal leg and arm strength, sensation and DTRs   Assessment: Encounter Diagnoses  Name Primary?  . Right facial pain Yes  . Eye fatigue, right   . Dizziness and giddiness   . Otalgia of both ears   . Weakness of both hips   . Left tennis elbow     Plan: Right facial pain, eye fatigue, dizziness, otalgia - discussed differential.  She does have some ear abnormality/redness on exam, possible eustachian tube salpingitis vs otitis media, but some symptoms suggest temporal arteritis or other such as trigeminal neuralgia or atypical migraine but less likely.   Sed rate lab today.  Use zpak and medrol dose pack.   F/u pending labs  Weakness of hips - ongoing, chronic per patient, may or may not be related to above symptoms.  Has been seeing other specialist for this on and off.  Left tennis elbow - advised ice to left forearm, relative rest, tennis elbow strap OTC, medrol does pak, f/u if not improving.

## 2015-01-03 LAB — SEDIMENTATION RATE: Sed Rate: 11 mm/hr (ref 0–30)

## 2015-01-06 ENCOUNTER — Telehealth: Payer: Self-pay | Admitting: Medical

## 2015-01-06 NOTE — Telephone Encounter (Signed)
Andria Frames spoke to pt

## 2015-01-06 NOTE — Telephone Encounter (Signed)
Left message for pt to call and let us know what is going on with her and/or if she needs an appt since she called the on call number and her message was unable to be heard in its entirety

## 2015-01-09 ENCOUNTER — Encounter: Payer: Self-pay | Admitting: Internal Medicine

## 2015-03-13 ENCOUNTER — Ambulatory Visit: Payer: BC Managed Care – PPO | Admitting: Gynecology

## 2015-03-13 ENCOUNTER — Ambulatory Visit: Payer: BC Managed Care – PPO | Admitting: Nurse Practitioner

## 2015-03-20 ENCOUNTER — Ambulatory Visit (INDEPENDENT_AMBULATORY_CARE_PROVIDER_SITE_OTHER): Payer: BC Managed Care – PPO | Admitting: Nurse Practitioner

## 2015-03-20 ENCOUNTER — Encounter: Payer: Self-pay | Admitting: Nurse Practitioner

## 2015-03-20 VITALS — BP 120/76 | HR 64 | Ht 64.5 in | Wt 127.0 lb

## 2015-03-20 DIAGNOSIS — Z01419 Encounter for gynecological examination (general) (routine) without abnormal findings: Secondary | ICD-10-CM | POA: Diagnosis not present

## 2015-03-20 DIAGNOSIS — Z79890 Hormone replacement therapy (postmenopausal): Secondary | ICD-10-CM | POA: Diagnosis not present

## 2015-03-20 DIAGNOSIS — Z0000 Encounter for general adult medical examination without abnormal findings: Secondary | ICD-10-CM | POA: Diagnosis not present

## 2015-03-20 LAB — POCT URINALYSIS DIPSTICK
Bilirubin, UA: NEGATIVE
Glucose, UA: NEGATIVE
Ketones, UA: NEGATIVE
Leukocytes, UA: NEGATIVE
Nitrite, UA: NEGATIVE
Protein, UA: NEGATIVE
Urobilinogen, UA: NEGATIVE
pH, UA: 6.5

## 2015-03-20 MED ORDER — ESTRADIOL 0.1 MG/24HR TD PTTW: 1.0000 | MEDICATED_PATCH | TRANSDERMAL | Status: DC

## 2015-03-20 NOTE — Progress Notes (Deleted)
54 y.o. G1P1 Divorced  {Race/ethnicity:17218} Fe here for annual exam.    Patient's last menstrual period was 01/11/2012.          Sexually active: {yes no:314532}  The current method of family planning is {contraception:315051}.    Exercising: {yes no:314532}  {types:19826} Smoker:  {YES NO:22349}  Health Maintenance: Pap:  *** MMG: 10/16/14 BIRADS1:neg Colonoscopy:  08/2010 repeat 5 years  BMD:   *** TDaP:  *** Labs: ***   reports that she has never smoked. She has never used smokeless tobacco. She reports that she does not drink alcohol or use illicit drugs.  Past Medical History  Diagnosis Date  . Anxiety   . GERD (gastroesophageal reflux disease)   . Migraine   . Uterine fibroid     hx/o   . Internal hemorrhoids   . Gastric polyp   . Abnormal Pap smear 1988    treated with cryo  . Hyperlipemia   . Anemia 11/2010    hematology consult prior; etiology malabsorption and uterine bleeding  . H/O hysterectomy for benign disease 5/14  . HTN (hypertension) 03/2010    hospitalization for HTN urgency  . History of echocardiogram 03/2010    normal LV function, EF 60-65%, mild left atrial enlargement  . H/O bone density study 12/2007  . Spondylosis, cervical   . Lumbar degenerative disc disease   . Umbilical hernia   . Myalgia     Past Surgical History  Procedure Laterality Date  . Cholecystectomy  2003  . Myomectomy  1998  . Cesarean section    . Exploratory laparotomy    . Cervical fusion  2010  . Colonoscopy  08/2010    Dr. Collene Mares  . Esophagogastroduodenoscopy  08/2010    Dr. Collene Mares  . Cervix lesion destruction  1988  . Pelvic laparoscopy    . Robotic assisted laparoscopic hysterectomy and salpingectomy  10/2012    [facility]; laproscopic due to fibroids  . Umbilical hernia repair  10/2013    infected laparoscopic port, mesh placement    Current Outpatient Prescriptions  Medication Sig Dispense Refill  . amLODipine (NORVASC) 5 MG tablet Take 1 tablet (5 mg total) by mouth  daily. Take 5 mg by mouth daily. 90 tablet 3  . Ascorbic Acid (VITAMIN C) 1000 MG tablet Take 1,000 mg by mouth daily.     Marland Kitchen aspirin EC 81 MG tablet Take 1 tablet (81 mg total) by mouth daily. 90 tablet 3  . atorvastatin (LIPITOR) 20 MG tablet Take 1 tablet (20 mg total) by mouth daily. 90 tablet 3  . azithromycin (ZITHROMAX) 250 MG tablet 2 tablets day 1, then 1 tablet days 2-4 6 tablet 0  . B Complex Vitamins (B COMPLEX PO) Take by mouth.    . calcium carbonate (OS-CAL) 600 MG TABS Take 600 mg by mouth daily.     Marland Kitchen estradiol (MINIVELLE) 0.1 MG/24HR patch Place 1 patch (0.1 mg total) onto the skin 2 (two) times a week. 8 patch 11  . fish oil-omega-3 fatty acids 1000 MG capsule Take 2 g by mouth daily.    . meclizine (ANTIVERT) 25 MG tablet Take 1 tablet (25 mg total) by mouth 3 (three) times daily as needed for dizziness. 20 tablet 0  . methylPREDNISolone (MEDROL) 4 MG tablet 6/5/4/3/2/1 taper 21 tablet 0  . Multiple Vitamin (MULTIVITAMIN) tablet Take 1 tablet by mouth daily.     . ondansetron (ZOFRAN) 4 MG tablet Take 1 tablet (4 mg total) by mouth every  6 (six) hours. Prn nausea (Patient not taking: Reported on 12/23/2014) 12 tablet 0  . scopolamine (TRANSDERM-SCOP, 1.5 MG,) 1 MG/3DAYS Place 1 patch (1.5 mg total) onto the skin every 3 (three) days. 10 patch 0   No current facility-administered medications for this visit.    Family History  Problem Relation Age of Onset  . Hypertension Mother   . Dementia Mother   . Colon cancer Mother 71  . Deep vein thrombosis Father   . Hypertension Father   . Cancer Father     prostate CA  . Heart disease Father 31    CAD  . Hypertension Sister   . Hyperlipidemia Sister   . Hypertension Brother   . Hyperlipidemia Brother   . Hypertension Maternal Aunt   . Hyperlipidemia Maternal Aunt   . Rheum arthritis Maternal Aunt   . Diabetes Maternal Aunt     1 with type II, 1 with type 1  . Diabetes Paternal Aunt     type II  . Hypertension Brother      ROS:  Pertinent items are noted in HPI.  Otherwise, a comprehensive ROS was negative.  Exam:   LMP 01/11/2012   Ht Readings from Last 3 Encounters:  03/07/14 5' 9.25" (1.759 m)  08/08/13 5\' 4"  (1.626 m)  06/13/13 5\' 4"  (1.626 m)    General appearance: alert, cooperative and appears stated age Head: Normocephalic, without obvious abnormality, atraumatic Neck: no adenopathy, supple, symmetrical, trachea midline and thyroid {EXAM; THYROID:18604} Lungs: clear to auscultation bilaterally Breasts: {Exam; breast:13139::"normal appearance, no masses or tenderness"} Heart: regular rate and rhythm Abdomen: soft, non-tender; no masses,  no organomegaly Extremities: extremities normal, atraumatic, no cyanosis or edema Skin: Skin color, texture, turgor normal. No rashes or lesions Lymph nodes: Cervical, supraclavicular, and axillary nodes normal. No abnormal inguinal nodes palpated Neurologic: Grossly normal   Pelvic: External genitalia:  no lesions              Urethra:  normal appearing urethra with no masses, tenderness or lesions              Bartholin's and Skene's: normal                 Vagina: normal appearing vagina with normal color and discharge, no lesions              Cervix: {exam; cervix:14595}              Pap taken: {yes no:314532} Bimanual Exam:  Uterus:  {exam; uterus:12215}              Adnexa: {exam; adnexa:12223}               Rectovaginal: Confirms               Anus:  normal sphincter tone, no lesions  Chaperone present: ***  A:  Well Woman with normal exam  P:   Reviewed health and wellness pertinent to exam  Pap smear as above  {plan; gyn:5269::"mammogram","pap smear","return annually or prn"}  An After Visit Summary was printed and given to the patient.

## 2015-03-20 NOTE — Progress Notes (Signed)
Patient ID: Shari Lawrence, female   DOB: Jul 04, 1960, 54 y.o.   MRN: [id number] 54 y.o. G1P0001 Divorced  African American Fe here for annual exam.  Same partner for 5 years, but not SA.  No new health problems.  Wants to continue on ERT.  She is now caring for her father age 46 and mother age 11.  Patient's last menstrual period was 10/25/2012 (approximate).  Pt has had hysterectomy        Sexually active: No.  The current method of family planning is abstinence and status post hysterectomy.    Exercising: Yes.    Cardio twice a week for 45 minutes at the gym.  Some type of exercise everyday. Smoker:  no  Health Maintenance: Pap:  March 2011, history of cryo in 1988 MMG:  10/16/14, Bi-Rads 1:  Negative Colonoscopy:  09/24/10, polyps, repeat in 5 years BMD:   12/31/14, T-Score -1.6 S / -1.1 Left Femur Neck TDaP:  02/04/12  Labs:  11/2014 in [facility]   Urine:  Trace RBC   reports that she has never smoked. She has never used smokeless tobacco. She reports that she does not drink alcohol or use illicit drugs.  Past Medical History  Diagnosis Date  . Anxiety   . GERD (gastroesophageal reflux disease)   . Migraine   . Uterine fibroid     hx/o   . Internal hemorrhoids   . Gastric polyp   . Abnormal Pap smear 1988    treated with cryo  . Hyperlipemia   . Anemia 11/2010    hematology consult prior; etiology malabsorption and uterine bleeding  . H/O hysterectomy for benign disease 5/14  . HTN (hypertension) 03/2010    hospitalization for HTN urgency  . History of echocardiogram 03/2010    normal LV function, EF 60-65%, mild left atrial enlargement  . H/O bone density study 12/2007  . Spondylosis, cervical   . Lumbar degenerative disc disease   . Umbilical hernia   . Myalgia     Past Surgical History  Procedure Laterality Date  . Cholecystectomy  2003  . Myomectomy  1998  . Cesarean section    . Exploratory laparotomy    . Cervical fusion  2010  . Colonoscopy  08/2010    Dr. Collene Mares  .  Esophagogastroduodenoscopy  08/2010    Dr. Collene Mares  . Cervix lesion destruction  1988  . Pelvic laparoscopy    . Robotic assisted laparoscopic hysterectomy and salpingectomy  10/2012    [facility]; laproscopic due to fibroids  . Umbilical hernia repair  10/2013    infected laparoscopic port, mesh placement    Current Outpatient Prescriptions  Medication Sig Dispense Refill  . amLODipine (NORVASC) 5 MG tablet Take 1 tablet (5 mg total) by mouth daily. Take 5 mg by mouth daily. 90 tablet 3  . Ascorbic Acid (VITAMIN C) 1000 MG tablet Take 1,000 mg by mouth daily.     Marland Kitchen aspirin EC 81 MG tablet Take 1 tablet (81 mg total) by mouth daily. 90 tablet 3  . atorvastatin (LIPITOR) 20 MG tablet Take 1 tablet (20 mg total) by mouth daily. 90 tablet 3  . B Complex Vitamins (B COMPLEX PO) Take by mouth.    . calcium carbonate (OS-CAL) 600 MG TABS Take 600 mg by mouth daily.     Marland Kitchen estradiol (MINIVELLE) 0.1 MG/24HR patch Place 1 patch (0.1 mg total) onto the skin 2 (two) times a week. 8 patch 11  . fish oil-omega-3 fatty  acids 1000 MG capsule Take 2 g by mouth daily.    . meclizine (ANTIVERT) 25 MG tablet Take 1 tablet (25 mg total) by mouth 3 (three) times daily as needed for dizziness. 20 tablet 0  . Multiple Vitamin (MULTIVITAMIN) tablet Take 1 tablet by mouth daily.     Marland Kitchen NEXIUM 40 MG capsule Take 1 capsule by mouth daily.     No current facility-administered medications for this visit.    Family History  Problem Relation Age of Onset  . Hypertension Mother   . Dementia Mother   . Colon cancer Mother 16  . Deep vein thrombosis Father   . Hypertension Father   . Cancer Father     prostate CA  . Heart disease Father 13    CAD  . Hypertension Sister   . Hyperlipidemia Sister   . Hypertension Brother   . Hyperlipidemia Brother   . Hypertension Maternal Aunt   . Hyperlipidemia Maternal Aunt   . Rheum arthritis Maternal Aunt   . Diabetes Maternal Aunt     1 with type II, 1 with type 1  . Diabetes  Paternal Aunt     type II  . Hypertension Brother     ROS:  Pertinent items are noted in HPI.  Otherwise, a comprehensive ROS was negative.  Exam:   BP 120/76 mmHg  Pulse 64  Ht 5' 4.5" (1.638 m)  Wt 127 lb (57.607 kg)  BMI 21.47 kg/m2  LMP 10/25/2012 (Approximate) Height: 5' 4.5" (163.8 cm) Ht Readings from Last 3 Encounters:  03/20/15 5' 4.5" (1.638 m)  03/07/14 5' 9.25" (1.759 m)  08/08/13 5\' 4"  (1.626 m)    General appearance: alert, cooperative and appears stated age Head: Normocephalic, without obvious abnormality, atraumatic Neck: no adenopathy, supple, symmetrical, trachea midline and thyroid normal to inspection and palpation Lungs: clear to auscultation bilaterally Breasts: normal appearance, no masses or tenderness Heart: regular rate and rhythm Abdomen: soft, non-tender; no masses,  no organomegaly Extremities: extremities normal, atraumatic, no cyanosis or edema Skin: Skin color, texture, turgor normal. No rashes or lesions Lymph nodes: Cervical, supraclavicular, and axillary nodes normal. No abnormal inguinal nodes palpated Neurologic: Grossly normal   Pelvic: External genitalia:  no lesions              Urethra:  normal appearing urethra with no masses, tenderness or lesions              Bartholin's and Skene's: normal                 Vagina: normal appearing vagina with normal color and discharge, no lesions              Cervix: absent              Pap taken: No. Bimanual Exam:  Uterus:  uterus absent              Adnexa: no mass, fullness, tenderness               Rectovaginal: Confirms               Anus:  normal sphincter tone, no lesions  Chaperone present: no  A:  Well Woman with normal exam  S/P Robotic lap hysterectomy & salpingectomy secondary to fibroids 10/2012 - on ERT  S/P umbilical hernia repair with mesh 10/2013  History of anemia, gluten intolerance, mild osteopenia    P:   Reviewed health and wellness pertinent to exam  Pap smear as  above  Mammogram is due 4/17  Refill on [place] dot 0.1 mg twice weekly - discussed trying to taper down to loser dose next year  Counseled with risk of DVT, CVA, cancer, etc.  Counseled on breast self exam, mammography screening, use and side effects of HRT, adequate intake of calcium and vitamin D, diet and exercise return annually or prn  An After Visit Summary was printed and given to the patient.

## 2015-03-27 NOTE — Progress Notes (Signed)
Encounter reviewed by Dr. Kazumi Lachney Amundson C. Silva.  

## 2015-04-09 ENCOUNTER — Other Ambulatory Visit: Payer: Self-pay

## 2015-04-09 NOTE — Telephone Encounter (Signed)
Pt had aex with 9/16 with Kem Boroughs. rx was written for 0.1mg . rx for 0.05 denied today

## 2015-10-13 ENCOUNTER — Ambulatory Visit (INDEPENDENT_AMBULATORY_CARE_PROVIDER_SITE_OTHER): Payer: BC Managed Care – PPO | Admitting: Nurse Practitioner

## 2015-10-13 ENCOUNTER — Encounter: Payer: Self-pay | Admitting: Nurse Practitioner

## 2015-10-13 VITALS — BP 130/76 | HR 64 | Temp 98.0°F | Ht 64.5 in | Wt 125.0 lb

## 2015-10-13 DIAGNOSIS — R3915 Urgency of urination: Secondary | ICD-10-CM | POA: Diagnosis not present

## 2015-10-13 DIAGNOSIS — R300 Dysuria: Secondary | ICD-10-CM

## 2015-10-13 LAB — POCT URINALYSIS DIPSTICK
Bilirubin, UA: NEGATIVE
Glucose, UA: NEGATIVE
Ketones, UA: NEGATIVE
Nitrite, UA: NEGATIVE
Protein, UA: NEGATIVE
Urobilinogen, UA: NEGATIVE
pH, UA: 7.5

## 2015-10-13 MED ORDER — NITROFURANTOIN MONOHYD MACRO 100 MG PO CAPS: 100.0000 mg | ORAL_CAPSULE | Freq: Two times a day (BID) | ORAL | Status: DC

## 2015-10-13 NOTE — Progress Notes (Signed)
Reviewed personally.  M. Suzanne Roosevelt Bisher, MD.  

## 2015-10-13 NOTE — Patient Instructions (Signed)
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
## 2015-10-14 LAB — URINALYSIS, MICROSCOPIC ONLY
Casts: NONE SEEN [LPF]
Crystals: NONE SEEN [HPF]
Yeast: NONE SEEN [HPF]

## 2015-10-15 LAB — URINE CULTURE: Colony Count: >=100000

## 2015-10-20 ENCOUNTER — Telehealth: Payer: Self-pay | Admitting: Nurse Practitioner

## 2015-10-20 NOTE — Telephone Encounter (Signed)
Patient would like to speak with nurse about her estradiol prescription. °

## 2015-10-20 NOTE — Telephone Encounter (Signed)
Spoke with patient. Patient states that the cost of her Estradiol 0.1 mg patch has increased and she would like to consider alternatives to reduce cost. Prefers to stay on a estrogen patch. Advised unfortunately we are unable to know cost for estrogen patches under her specific insurance plan. Advised she will need to contact her insurance company to check coverage for alternative patches. She is agreeable and will return call to office with recommendations from her insurance company. Advised once we have alternative patch options I can speak with Kem Boroughs, FNP regarding medication change. She is agreeable.  Routing to provider for final review. Patient agreeable to disposition. Will close encounter.

## 2015-10-27 ENCOUNTER — Telehealth: Payer: Self-pay | Admitting: Nurse Practitioner

## 2015-10-27 MED ORDER — NITROFURANTOIN MONOHYD MACRO 100 MG PO CAPS: ORAL_CAPSULE | ORAL | Status: DC

## 2015-10-27 NOTE — Telephone Encounter (Signed)
Spoke with patient. Advised I have spoken with Kem Boroughs, FNP who states the patient will need to take Macrobid 100 mg bid for an additional 2 and 1/2 days (5 tablets in total) after she completes her last 5 tablets from her previously prescription. She is agreeable and verbalizes understanding. Advised patient with the remaining 9 tablets in the prescription per Kem Boroughs, FNP she may take 1 Macrobid 100 mg post coitally to reduce risk for another UTI. Rx for Macrobid 100 mg take 1 capsule by mouth bid for 2 days, take 1 capsule on the 3rd day, use remaining tablets post-coitally sent to pharmacy on file.  Kem Boroughs, FNP, do you agree with recommendations?

## 2015-10-27 NOTE — Telephone Encounter (Signed)
Patient calling with questions about the antibiotic she was prescribed. She said, "I took the medication for a uti but then misplaced it. The symptoms have now come back. I found what was left of the antibiotics and I just need to know if it is enough to clear up the infection."

## 2015-10-27 NOTE — Telephone Encounter (Signed)
Spoke with patient. Patient was seen in the office on 10/13/2015 for UTI symptoms. Urine culture returned positive and she was started on Macrobid 100 mg bid x 7 days. Patient states that she misplaced her Macrobid rx and missed 2 days. Found the prescription and started taking it again as her symptoms of burning with urination, bladder fullness, and odor returned. States when she started taking the rx again she had 5 tablets left. Reports symptoms resolved after taking the first dose. Asking if she will need an additional prescription or if completing the last 5 tablets will clear the bacteria. Advised I will speak with Kem Boroughs, FNP regarding recommendations and return call with further recommendations. She is agreeable.

## 2015-10-27 NOTE — Telephone Encounter (Signed)
Left message to call Shari Lawrence at [phone]. 

## 2015-11-06 NOTE — Telephone Encounter (Signed)
Kem Boroughs, FNP, do you agree with recommendations given? Okay to close encounter?

## 2015-11-09 NOTE — Telephone Encounter (Signed)
Agree with recommendations.  

## 2015-12-02 ENCOUNTER — Encounter: Payer: Self-pay | Admitting: Medical

## 2015-12-02 ENCOUNTER — Ambulatory Visit (INDEPENDENT_AMBULATORY_CARE_PROVIDER_SITE_OTHER): Payer: BC Managed Care – PPO | Admitting: Medical

## 2015-12-02 VITALS — BP 110/80 | HR 56 | Wt 126.0 lb

## 2015-12-02 DIAGNOSIS — N951 Menopausal and female climacteric states: Secondary | ICD-10-CM

## 2015-12-02 DIAGNOSIS — M79674 Pain in right toe(s): Secondary | ICD-10-CM

## 2015-12-02 DIAGNOSIS — M21619 Bunion of unspecified foot: Secondary | ICD-10-CM

## 2015-12-02 DIAGNOSIS — N898 Other specified noninflammatory disorders of vagina: Secondary | ICD-10-CM | POA: Diagnosis not present

## 2015-12-02 DIAGNOSIS — R300 Dysuria: Secondary | ICD-10-CM | POA: Diagnosis not present

## 2015-12-02 LAB — POCT URINALYSIS DIPSTICK
Bilirubin, UA: NEGATIVE
Glucose, UA: NEGATIVE
Ketones, UA: NEGATIVE
Leukocytes, UA: NEGATIVE
Nitrite, UA: NEGATIVE
Protein, UA: NEGATIVE
Spec Grav, UA: 1.025
Urobilinogen, UA: NEGATIVE
pH, UA: 6

## 2015-12-02 MED ORDER — NITROFURANTOIN MONOHYD MACRO 100 MG PO CAPS: ORAL_CAPSULE | ORAL | Status: DC

## 2015-12-02 MED ORDER — ESTROGENS, CONJUGATED 0.625 MG/GM VA CREA: 1.0000 | TOPICAL_CREAM | Freq: Every day | VAGINAL | Status: DC

## 2015-12-02 NOTE — Progress Notes (Signed)
Subjective: Chief Complaint  Patient presents with  . rt foot pain    said that it is painful enough it limits her on doing things. said that she may think it is a bone near her big toe. also suspecting UTI   Here for a few issues.  Thinks she may have UTI.    UTI recurrence - she notes having distinct urinary odor and irritation when she urinates the last few months.  Denies urinary frequency, urgency,hematuria, fever. She does note some low back pain and has intermittent abdominal pain.  She denies vaginal discharge, no concern for STD.   She does note extreme vaginal dryness.  Has been on hormone patch the last 1.5 years mainly for hot flashes but it doesn't seem to help the dryness.  Was advised to use lubricants.  Regarding hormonal risks, father has hx/o PE, on warfarin.  She notes 2.5 month hx/o pain in right great toe.  She likes to wear heels, but can't do that currently.  No injury or trauma.  No other aggravating or relieving factors. No other complaint.  Past Medical History  Diagnosis Date  . Anxiety   . GERD (gastroesophageal reflux disease)   . Migraine   . Uterine fibroid     hx/o   . Internal hemorrhoids   . Gastric polyp   . Abnormal Pap smear 1988    treated with cryo  . Hyperlipemia   . Anemia 11/2010    hematology consult prior; etiology malabsorption and uterine bleeding  . H/O hysterectomy for benign disease 5/14  . HTN (hypertension) 03/2010    hospitalization for HTN urgency  . History of echocardiogram 03/2010    normal LV function, EF 60-65%, mild left atrial enlargement  . H/O bone density study 12/2007  . Spondylosis, cervical   . Lumbar degenerative disc disease   . Umbilical hernia   . Myalgia    Past Surgical History  Procedure Laterality Date  . Cholecystectomy  2003  . Myomectomy  1998  . Cesarean section    . Exploratory laparotomy    . Cervical fusion  2010  . Colonoscopy  08/2010    Dr. Collene Mares  . Esophagogastroduodenoscopy  08/2010   Dr. Collene Mares  . Cervix lesion destruction  1988  . Pelvic laparoscopy    . Robotic assisted laparoscopic hysterectomy and salpingectomy  10/2012    [facility]; laproscopic due to fibroids  . Umbilical hernia repair  10/2013    infected laparoscopic port, mesh placement      Objective: BP 110/80 mmHg  Pulse 56  Wt 126 lb (57.153 kg)  LMP 10/25/2012 (Approximate)  General appearance: alert, no distress, WD/WN Abdomen: +bs, soft, mild epigastric and central abdominal tenderness, otherwise non tender, non distended, no masses, no hepatomegaly, no splenomegaly Back: non tender Tender alone MTP of right great toe and bilat bunions noted Feet neurovascularly intact GU deferred   Assessment: Encounter Diagnoses  Name Primary?  . Dysuria Yes  . Vaginal dryness   . Menopausal state   . Toe pain, right   . Bunion     Plan:  urinalysis reviewed, unremarkable.  reviewed prior recent urine culture. Gave Macrobid in the event of worse UTI symptoms in the coming days, but likely her issue is vaginal dryness related to menopause  Begin trial of Premarin cream.  I will send message to her gynecologist that we are trying this with plan to wean off hormone patch in a few weeks if this works well  Toe pain and bunion - possible metatarsalgia and bunion pain.  discussed possible treatments.  She will try OTC NSAID, avoid heels for now, and will purchase and OTC bunion splint.  Consider podiatry going forward.   Shari Lawrence was seen today for rt foot pain.  Diagnoses and all orders for this visit:  Dysuria -     POCT urinalysis dipstick  Vaginal dryness  Menopausal state  Toe pain, right  Bunion  Other orders -     nitrofurantoin, macrocrystal-monohydrate, (MACROBID) 100 MG capsule; 1 tablet po BID -     conjugated estrogens (PREMARIN) vaginal cream; Place 1 Applicatorful vaginally daily. Use pea size amount vaginally 3 times per week the first week, then 1-2 times per week

## 2015-12-30 ENCOUNTER — Ambulatory Visit (INDEPENDENT_AMBULATORY_CARE_PROVIDER_SITE_OTHER): Payer: BC Managed Care – PPO | Admitting: Medical

## 2015-12-30 ENCOUNTER — Encounter: Payer: Self-pay | Admitting: Medical

## 2015-12-30 VITALS — BP 110/80 | HR 55 | Wt 126.0 lb

## 2015-12-30 DIAGNOSIS — M549 Dorsalgia, unspecified: Secondary | ICD-10-CM | POA: Diagnosis not present

## 2015-12-30 DIAGNOSIS — N951 Menopausal and female climacteric states: Secondary | ICD-10-CM | POA: Diagnosis not present

## 2015-12-30 DIAGNOSIS — N898 Other specified noninflammatory disorders of vagina: Secondary | ICD-10-CM

## 2015-12-30 DIAGNOSIS — M79674 Pain in right toe(s): Secondary | ICD-10-CM | POA: Diagnosis not present

## 2015-12-30 DIAGNOSIS — R232 Flushing: Secondary | ICD-10-CM

## 2015-12-30 DIAGNOSIS — M2011 Hallux valgus (acquired), right foot: Secondary | ICD-10-CM

## 2015-12-30 DIAGNOSIS — M62830 Muscle spasm of back: Secondary | ICD-10-CM | POA: Diagnosis not present

## 2015-12-30 LAB — URIC ACID: Uric Acid, Serum: 4 mg/dL (ref 2.5–7.0)

## 2015-12-30 MED ORDER — METHOCARBAMOL 500 MG PO TABS: 500.0000 mg | ORAL_TABLET | Freq: Three times a day (TID) | ORAL | Status: DC | PRN

## 2015-12-30 MED ORDER — NAPROXEN 375 MG PO TABS: 375.0000 mg | ORAL_TABLET | Freq: Two times a day (BID) | ORAL | Status: DC

## 2015-12-30 NOTE — Progress Notes (Signed)
Subjective: Chief Complaint  Patient presents with  . Back Pain    started last night. was bringing in groceries and when she sat the bags down the pain started. in the lower back area.    Here for acute back pain.   Brought in groceries last night, after setting bags down, and on the way to rise up felt some immediate pain like a continual spasm.  Felt pain across low back, gripping type pain.  Went to sit down.  Since last night the pain has been continual, feels like she can;t life her legs or do any activity due to the pain.  Tried some ibuprofen, 2 OTC this morning.  If lying flat has the least amount of pain.  Turning, twisting in bed, walking, bending, all hurts.  Walking like a little old lady.   Hurts to sit as well.   Used some heat pad.  Has had some back issues in the past, prior abnormal scans, DDD, but hasn't c/o back pain ongoing, never consistent.  No fall, no trauma.  No numbness or tingling in the legs.  No incontinence, no fever.  She notes ongoing pains in right great toe, bunion, pain with direct pressure under great toe.    She found out after last visit here that her sister and several females in the family have had to have bunion surgery.  Since last visit only using hormone patch once weekly and using the premarin cream 2 times per week.  Does see improvement on the premarin cream, less dryness and irritations as per last visit.   No other aggravating or relieving factors. No other complaint.  Past Medical History  Diagnosis Date  . Anxiety   . GERD (gastroesophageal reflux disease)   . Migraine   . Uterine fibroid     hx/o   . Internal hemorrhoids   . Gastric polyp   . Abnormal Pap smear 1988    treated with cryo  . Hyperlipemia   . Anemia 11/2010    hematology consult prior; etiology malabsorption and uterine bleeding  . H/O hysterectomy for benign disease 5/14  . HTN (hypertension) 03/2010    hospitalization for HTN urgency  . History of echocardiogram  03/2010    normal LV function, EF 60-65%, mild left atrial enlargement  . H/O bone density study 12/2007  . Spondylosis, cervical   . Lumbar degenerative disc disease   . Umbilical hernia   . Myalgia    ROS as in subjective   Objective: BP 110/80 mmHg  Pulse 55  Wt 126 lb (57.153 kg)  LMP 10/25/2012 (Approximate)  Gen;wd, wn,nad Skin unremarkable non tender to palpation of back, but pain reported in lumbar spine throughout with flexion and extension, although ROM is full.   No scoliosis or deformity MSK: tender right MCP, otherwise legs non tender, no deformity, normal leg ROM Legs neurovascularly intact No swelling    Assessment: Encounter Diagnoses  Name Primary?  . Acute back pain Yes  . Back spasm   . Bunion of great toe, right   . Pain of great toe, right   . Vaginal dryness   . Hot flashes   . Menopausal state      Plan: Back pain, spasm - advised the next several days to use relative rest, gentle stretching and ROM exercise, heat, consider massage, begin Robaxin prn, naprosyn BID the next several days.  Use caution with muscle relaxer due to possible sedation.  Once symptoms resolve, use daily starting  routine, exercise as usual, and add some back and core strengthening exercise as discussed  Toe pain, bunion - uric acid level today to help rule out gout, but seems more bunion related.   Will likely refer to ortho  Vaginal dryness, hot flashes, menopausal - c/t premarin cream, consult with gynecology about the HRT patch.  Clonidine is another option for hot flashes.

## 2015-12-31 ENCOUNTER — Other Ambulatory Visit: Payer: Self-pay | Admitting: Medical

## 2015-12-31 ENCOUNTER — Telehealth: Payer: Self-pay

## 2015-12-31 ENCOUNTER — Other Ambulatory Visit: Payer: Self-pay | Admitting: Nurse Practitioner

## 2015-12-31 DIAGNOSIS — Z1231 Encounter for screening mammogram for malignant neoplasm of breast: Secondary | ICD-10-CM

## 2015-12-31 DIAGNOSIS — M21619 Bunion of unspecified foot: Secondary | ICD-10-CM

## 2015-12-31 NOTE — Telephone Encounter (Signed)
Referred to [facility]

## 2015-12-31 NOTE — Telephone Encounter (Signed)
-----   Message from Carlena Hurl, PA-C sent at 12/31/2015  7:22 AM EDT ----- Uric acid normal.  Refer to ortho (not podiatry) for bunion pain.

## 2016-01-11 ENCOUNTER — Telehealth: Payer: Self-pay

## 2016-01-11 NOTE — Telephone Encounter (Signed)
Called pt per a fax that we received to make sure she was not taking meloxicam and naproxen together since they are the same type of medication. Pt did not answer and mailbox was full

## 2016-01-13 NOTE — Telephone Encounter (Signed)
Pt is aware.  

## 2016-01-28 ENCOUNTER — Ambulatory Visit
Admission: RE | Admit: 2016-01-28 | Discharge: 2016-01-28 | Disposition: A | Discharge status: Home / Self Care | Payer: BC Managed Care – PPO | Source: Ambulatory Visit | Attending: Nurse Practitioner | Admitting: Nurse Practitioner

## 2016-01-28 DIAGNOSIS — Z1231 Encounter for screening mammogram for malignant neoplasm of breast: Secondary | ICD-10-CM

## 2016-02-01 LAB — HM COLONOSCOPY

## 2016-02-04 ENCOUNTER — Other Ambulatory Visit: Payer: Self-pay | Admitting: Medical

## 2016-02-10 ENCOUNTER — Telehealth: Payer: Self-pay

## 2016-02-11 ENCOUNTER — Other Ambulatory Visit: Payer: Self-pay | Admitting: Medical

## 2016-02-11 NOTE — Telephone Encounter (Signed)
error 

## 2016-02-11 NOTE — Telephone Encounter (Signed)
Is this ok to refill? Or does she need an appt?

## 2016-03-09 ENCOUNTER — Ambulatory Visit (HOSPITAL_COMMUNITY)
Admission: EM | Admit: 2016-03-09 | Discharge: 2016-03-09 | Disposition: A | Discharge status: Home / Self Care | Payer: BC Managed Care – PPO | Attending: Family Medicine | Admitting: Family Medicine

## 2016-03-09 ENCOUNTER — Encounter (HOSPITAL_COMMUNITY): Payer: Self-pay | Admitting: Emergency Medicine

## 2016-03-09 DIAGNOSIS — R109 Unspecified abdominal pain: Secondary | ICD-10-CM | POA: Diagnosis present

## 2016-03-09 DIAGNOSIS — F411 Generalized anxiety disorder: Secondary | ICD-10-CM | POA: Diagnosis not present

## 2016-03-09 DIAGNOSIS — Z880 Allergy status to penicillin: Secondary | ICD-10-CM | POA: Diagnosis not present

## 2016-03-09 DIAGNOSIS — K219 Gastro-esophageal reflux disease without esophagitis: Secondary | ICD-10-CM | POA: Insufficient documentation

## 2016-03-09 DIAGNOSIS — E785 Hyperlipidemia, unspecified: Secondary | ICD-10-CM | POA: Diagnosis not present

## 2016-03-09 DIAGNOSIS — K648 Other hemorrhoids: Secondary | ICD-10-CM | POA: Insufficient documentation

## 2016-03-09 DIAGNOSIS — D649 Anemia, unspecified: Secondary | ICD-10-CM | POA: Insufficient documentation

## 2016-03-09 DIAGNOSIS — R1012 Left upper quadrant pain: Secondary | ICD-10-CM | POA: Diagnosis not present

## 2016-03-09 DIAGNOSIS — K449 Diaphragmatic hernia without obstruction or gangrene: Secondary | ICD-10-CM | POA: Insufficient documentation

## 2016-03-09 DIAGNOSIS — G43909 Migraine, unspecified, not intractable, without status migrainosus: Secondary | ICD-10-CM | POA: Diagnosis not present

## 2016-03-09 DIAGNOSIS — I10 Essential (primary) hypertension: Secondary | ICD-10-CM | POA: Diagnosis not present

## 2016-03-09 LAB — CBC
HCT: 37.3 % (ref 36.0–46.0)
Hemoglobin: 12 g/dL (ref 12.0–15.0)
MCH: 29.5 pg (ref 26.0–34.0)
MCHC: 32.2 g/dL (ref 30.0–36.0)
MCV: 91.6 fL (ref 78.0–100.0)
Platelets: 169 10*3/uL (ref 150–400)
RBC: 4.07 MIL/uL (ref 3.87–5.11)
RDW: 14.7 % (ref 11.5–15.5)
WBC: 5.9 10*3/uL (ref 4.0–10.5)

## 2016-03-09 LAB — POCT URINALYSIS DIP (DEVICE)
Bilirubin Urine: NEGATIVE
Glucose, UA: NEGATIVE mg/dL
Hgb urine dipstick: NEGATIVE
Leukocytes, UA: NEGATIVE
Nitrite: NEGATIVE
Protein, ur: NEGATIVE mg/dL
Specific Gravity, Urine: 1.01 (ref 1.005–1.030)
Urobilinogen, UA: 0.2 mg/dL (ref 0.0–1.0)
pH: 7.5 (ref 5.0–8.0)

## 2016-03-09 LAB — COMPREHENSIVE METABOLIC PANEL
ALT: 24 U/L (ref 14–54)
AST: 22 U/L (ref 15–41)
Albumin: 3.9 g/dL (ref 3.5–5.0)
Alkaline Phosphatase: 54 U/L (ref 38–126)
Anion gap: 9 (ref 5–15)
BUN: 11 mg/dL (ref 6–20)
CO2: 25 mmol/L (ref 22–32)
Calcium: 9.9 mg/dL (ref 8.9–10.3)
Chloride: 105 mmol/L (ref 101–111)
Creatinine, Ser: 0.85 mg/dL (ref 0.44–1.00)
GFR calc Af Amer: >60 mL/min (ref >60)
GFR calc non Af Amer: >60 mL/min (ref >60)
Glucose, Bld: 89 mg/dL (ref 65–99)
Potassium: 3.9 mmol/L (ref 3.5–5.1)
Sodium: 139 mmol/L (ref 135–145)
Total Bilirubin: 0.7 mg/dL (ref 0.3–1.2)
Total Protein: 6.5 g/dL (ref 6.5–8.1)

## 2016-03-09 LAB — LIPASE, BLOOD: Lipase: 21 U/L (ref 11–51)

## 2016-03-09 MED ORDER — TRAMADOL HCL 50 MG PO TABS: 50.0000 mg | ORAL_TABLET | Freq: Four times a day (QID) | ORAL | 0 refills | Status: DC | PRN

## 2016-03-09 NOTE — Discharge Instructions (Signed)
Start taking nexium twice per day Take tramadol as needed for the pain until you follow up with Dr. Collene Mares Stay hydrated Return for urgent care if your symptoms worsen

## 2016-03-09 NOTE — ED Provider Notes (Signed)
[facility]    CSN: [id number] Arrival date & time: 03/09/16  1514  First Provider Contact:  First MD Initiated Contact with Patient 03/09/16 1742    History   Chief Complaint Chief Complaint  Patient presents with  . Abdominal Pain    HPI Shari Lawrence is a 55 y.o. female presenting for evaluation of acute LUQ abdominal pain.   HPI  Past Medical History:  Diagnosis Date  . Abnormal Pap smear 1988   treated with cryo  . Anemia 11/2010   hematology consult prior; etiology malabsorption and uterine bleeding  . Anxiety   . Gastric polyp   . GERD (gastroesophageal reflux disease)   . H/O bone density study 12/2007  . H/O hysterectomy for benign disease 5/14  . History of echocardiogram 03/2010   normal LV function, EF 60-65%, mild left atrial enlargement  . HTN (hypertension) 03/2010   hospitalization for HTN urgency  . Hyperlipemia   . Internal hemorrhoids   . Lumbar degenerative disc disease   . Migraine   . Myalgia   . Spondylosis, cervical   . Umbilical hernia   . Uterine fibroid    hx/o     Patient Active Problem List   Diagnosis Date Noted  . Essential hypertension 12/23/2014  . Hyperlipidemia 12/23/2014  . History of anemia 12/23/2014  . Care provider for parents 12/23/2014  . Impaired fasting glucose 10/25/2011  . Gluten intolerance 10/25/2011  . GERD (gastroesophageal reflux disease) 10/25/2011  . Essential hypertension, benign 10/25/2011  . Anxiety state   . Iron deficiency anemia     Past Surgical History:  Procedure Laterality Date  . CERVICAL FUSION  2010  . CERVIX LESION DESTRUCTION  1988  . CESAREAN SECTION    . CHOLECYSTECTOMY  2003  . COLONOSCOPY  08/2010   Dr. Collene Mares  . ESOPHAGOGASTRODUODENOSCOPY  08/2010   Dr. Collene Mares  . EXPLORATORY LAPAROTOMY    . MYOMECTOMY  1998  . PELVIC LAPAROSCOPY    . ROBOTIC ASSISTED LAPAROSCOPIC HYSTERECTOMY AND SALPINGECTOMY  10/2012   [facility]; laproscopic due to fibroids  . UMBILICAL HERNIA REPAIR   10/2013   infected laparoscopic port, mesh placement    OB History    Gravida Para Term Preterm AB Living   1 1 0 0 0 1   SAB TAB Ectopic Multiple Live Births   0 0 0 0 1       Home Medications    Prior to Admission medications   Medication Sig Start Date End Date Taking? Authorizing Provider  amLODipine (NORVASC) 5 MG tablet TAKE 1 TABLET BY MOUTH EVERY DAY 02/11/16   [name] Tysinger, PA-C  Ascorbic Acid (VITAMIN C) 1000 MG tablet Take 1,000 mg by mouth daily.     Historical Provider, MD  aspirin 81 MG EC tablet TAKE 1 TABLET (81 MG TOTAL) BY MOUTH DAILY. 02/04/16   [name] Tysinger, PA-C  atorvastatin (LIPITOR) 20 MG tablet Take 1 tablet (20 mg total) by mouth daily. 12/24/14   [name] Tysinger, PA-C  B Complex Vitamins (B COMPLEX PO) Take by mouth.    Historical Provider, MD  calcium carbonate (OS-CAL) 600 MG TABS Take 600 mg by mouth daily.     Historical Provider, MD  conjugated estrogens (PREMARIN) vaginal cream Place 1 Applicatorful vaginally daily. Use pea size amount vaginally 3 times per week the first week, then 1-2 times per week 12/02/15   [name] Tysinger, PA-C  estradiol (MINIVELLE) 0.1 MG/24HR patch Place 1  patch (0.1 mg total) onto the skin 2 (two) times a week. Patient not taking: Reported on 03/09/2016 03/20/15   Kem Boroughs, FNP  fish oil-omega-3 fatty acids 1000 MG capsule Take 2 g by mouth daily.    Historical Provider, MD  methocarbamol (ROBAXIN) 500 MG tablet Take 1 tablet (500 mg total) by mouth every 8 (eight) hours as needed for muscle spasms. 12/30/15   [name] Tysinger, PA-C  Multiple Vitamin (MULTIVITAMIN) tablet Take 1 tablet by mouth daily.     Historical Provider, MD  naproxen (NAPROSYN) 375 MG tablet Take 1 tablet (375 mg total) by mouth 2 (two) times daily with a meal. 12/30/15   [name] Tysinger, PA-C  NEXIUM 40 MG capsule Take 1 capsule by mouth daily. Reported on 12/02/2015 03/13/15   Historical Provider, MD  traMADol (ULTRAM) 50 MG tablet Take 1 tablet (50  mg total) by mouth every 6 (six) hours as needed. 03/09/16   Patrecia Pour, MD    Family History Family History  Problem Relation Age of Onset  . Hypertension Mother   . Dementia Mother   . Colon cancer Mother 25  . Deep vein thrombosis Father   . Hypertension Father   . Cancer Father     prostate CA  . Heart disease Father 60    CAD  . Hypertension Sister   . Hyperlipidemia Sister   . Hypertension Brother   . Hyperlipidemia Brother   . Hypertension Maternal Aunt   . Hyperlipidemia Maternal Aunt   . Rheum arthritis Maternal Aunt   . Diabetes Maternal Aunt     1 with type II, 1 with type 1  . Hypertension Brother   . Diabetes Paternal Aunt     type II    Social History Social History  Substance Use Topics  . Smoking status: Never Smoker  . Smokeless tobacco: Never Used  . Alcohol use No     Allergies   Quinolones; Gluten meal; Latex; Penicillins; Percocet [oxycodone-acetaminophen]; Sulfa drugs cross reactors; and Vicodin [hydrocodone-acetaminophen]   Review of Systems Review of Systems   Physical Exam Triage Vital Signs ED Triage Vitals  Enc Vitals Group     BP 03/09/16 1736 137/95     Pulse Rate 03/09/16 1736 66     Resp 03/09/16 1736 18     Temp 03/09/16 1736 98.2 F (36.8 C)     Temp Source 03/09/16 1736 Oral     SpO2 03/09/16 1736 100 %     Weight 03/09/16 1736 126 lb (57.2 kg)     Height 03/09/16 1736 5\' 4"  (1.626 m)     Head Circumference --      Peak Flow --      Pain Score 03/09/16 1752 7     Pain Loc --      Pain Edu? --      Excl. in [place]? --    No data found.   Updated Vital Signs BP 137/95 (BP Location: Left Arm)   Pulse 66   Temp 98.2 F (36.8 C) (Oral)   Resp 18   Ht 5\' 4"  (1.626 m)   Wt 126 lb (57.2 kg)   LMP 10/25/2012 (Approximate)   SpO2 100%   BMI 21.63 kg/m   Visual Acuity Right Eye Distance:   Left Eye Distance:   Bilateral Distance:    Right Eye Near:   Left Eye Near:    Bilateral Near:     Physical  Exam   UC  Treatments / Results  Labs (all labs ordered are listed, but only abnormal results are displayed) Labs Reviewed  POCT URINALYSIS DIP (DEVICE) - Abnormal; Notable for the following:       Result Value   Ketones, ur TRACE (*)    All other components within normal limits  CBC  COMPREHENSIVE METABOLIC PANEL  LIPASE, BLOOD    EKG  EKG Interpretation None       Radiology No results found.  Procedures Procedures (including critical care time)  Medications Ordered in UC Medications - No data to display   Initial Impression / Assessment and Plan / UC Course  I have reviewed the triage vital signs and the nursing notes.  Pertinent labs & imaging results that were available during my care of the patient were reviewed by me and considered in my medical decision making (see chart for details).  Final Clinical Impressions(s) / UC Diagnoses   Final diagnoses:  Hiatal hernia  Acute LUQ pain   Shari Lawrence is a 55 y.o. female presenting with LUQ abdominal pain. Pain worsened by bending, standing, etc.  but exam is not consistent with MSK etiology. Vital signs are normal and she is in no distress. Abdominal exam is benign with LUQ tenderness. She's had multiple abdominal/pelvic surgeries so bowel obstruction is considered, but recent BM and flatus argues against this. She has known hiatal hernia and I wonder if this has acutely slipped.  - Pt preferred to have labs drawn to rule out infection, pancreatitis, etc. though these are not strongly suspected they remain in the differential and so they were ordered. - She will call her GI, Dr. Collene Mares in the AM for reevaluation.  - Due to concern for worsening gastritis/esophagitis NSAIDs are avoided. Tylenol is recommended and tramadol prescribed for pain.  - She will take her PPI BID (up from daily) until follow up.  New Prescriptions New Prescriptions   TRAMADOL (ULTRAM) 50 MG TABLET    Take 1 tablet (50 mg total) by mouth every  6 (six) hours as needed.     Patrecia Pour, MD 03/09/16 [phone]

## 2016-03-09 NOTE — ED Notes (Signed)
Patient stated that she did not want the x-rays, she was seeing her PCP tomorrow. Reported to Dr Bonner Puna that patient refused the two view Abdomen

## 2016-03-09 NOTE — ED Triage Notes (Signed)
Sudden onset of luq pain.  Reports nausea, no vomiting.  Symptoms are intermittent.  Last bm was today and normal.  Denies any urinary symptoms.

## 2016-03-25 ENCOUNTER — Ambulatory Visit: Payer: BC Managed Care – PPO | Admitting: Nurse Practitioner

## 2016-03-30 ENCOUNTER — Ambulatory Visit (INDEPENDENT_AMBULATORY_CARE_PROVIDER_SITE_OTHER): Payer: BC Managed Care – PPO | Admitting: Nurse Practitioner

## 2016-03-30 ENCOUNTER — Other Ambulatory Visit: Payer: Self-pay | Admitting: Nurse Practitioner

## 2016-03-30 ENCOUNTER — Encounter: Payer: Self-pay | Admitting: Nurse Practitioner

## 2016-03-30 VITALS — BP 108/66 | HR 64 | Ht 64.0 in | Wt 126.0 lb

## 2016-03-30 DIAGNOSIS — Z0000 Encounter for general adult medical examination without abnormal findings: Secondary | ICD-10-CM

## 2016-03-30 DIAGNOSIS — Z79890 Hormone replacement therapy (postmenopausal): Secondary | ICD-10-CM

## 2016-03-30 DIAGNOSIS — Z01419 Encounter for gynecological examination (general) (routine) without abnormal findings: Secondary | ICD-10-CM | POA: Diagnosis not present

## 2016-03-30 MED ORDER — ESTRADIOL 0.1 MG/24HR TD PTTW: 1.0000 | MEDICATED_PATCH | TRANSDERMAL | 4 refills | Status: DC

## 2016-03-30 NOTE — Progress Notes (Signed)
Patient ID: Shari Lawrence, female   DOB: [birth year], 55 y.o.   MRN: [id number]  55 y.o. G1P1001 Divorced  African American Fe here for annual exam.  In general no vaso symptoms.  Still vaginal dryness.  Same partner since January.  Father is now getting worse with his stability and falling.  Mother with Alzheimer's is actually easier to care for than the father.  She has sitters during the day and evening while she works.  She then is up and down at night several times and her quality of sleep is interrupted.  Might have to make decisions about long term care.  Siblings X 3 are not helpful.  Patient's last menstrual period was 10/25/2012 (approximate).          Sexually active: No.  The current method of family planning is status post hysterectomy.    Exercising: Yes.    vigorous walking all day Smoker:  no  Health Maintenance: Pap:  March 2011, Negative; hysterectomy MMG: 01/28/16, Bi-Rads 1:  Negative Colonoscopy & Endoscopy: 01/2016, Normal, repeat in 5 years due to family history of colon cancer, Dr. Collene Mares BMD:   12/31/14, T-Score -1.6 Spine / -1.1 Left Femur Neck TDaP:  02/04/12 Hep C: done today  (had HIV done in the past and declines now) Labs: PCP and recent urgent care visit   reports that she has never smoked. She has never used smokeless tobacco. She reports that she does not drink alcohol or use drugs.  Past Medical History:  Diagnosis Date  . Abnormal Pap smear 1988   treated with cryo  . Anemia 11/2010   hematology consult prior; etiology malabsorption and uterine bleeding  . Anxiety   . Gastric polyp   . GERD (gastroesophageal reflux disease)   . H/O bone density study 12/2007  . H/O hysterectomy for benign disease 5/14  . History of echocardiogram 03/2010   normal LV function, EF 60-65%, mild left atrial enlargement  . HTN (hypertension) 03/2010   hospitalization for HTN urgency  . Hyperlipemia   . Internal hemorrhoids   . Lumbar degenerative disc disease   . Migraine   .  Myalgia   . Spondylosis, cervical   . Umbilical hernia   . Uterine fibroid    hx/o     Past Surgical History:  Procedure Laterality Date  . CERVICAL FUSION  2010  . CERVIX LESION DESTRUCTION  1988  . CESAREAN SECTION    . CHOLECYSTECTOMY  2003  . COLONOSCOPY  08/2010   Dr. Collene Mares  . ESOPHAGOGASTRODUODENOSCOPY  08/2010   Dr. Collene Mares  . EXPLORATORY LAPAROTOMY    . MYOMECTOMY  1998  . PELVIC LAPAROSCOPY    . ROBOTIC ASSISTED LAPAROSCOPIC HYSTERECTOMY AND SALPINGECTOMY  10/2012   [facility]; laproscopic due to fibroids  . UMBILICAL HERNIA REPAIR  10/2013   infected laparoscopic port, mesh placement    Current Outpatient Prescriptions  Medication Sig Dispense Refill  . amLODipine (NORVASC) 5 MG tablet TAKE 1 TABLET BY MOUTH EVERY DAY 90 tablet 1  . Ascorbic Acid (VITAMIN C) 1000 MG tablet Take 1,000 mg by mouth daily.     Marland Kitchen aspirin 81 MG EC tablet TAKE 1 TABLET (81 MG TOTAL) BY MOUTH DAILY. 90 tablet 2  . atorvastatin (LIPITOR) 20 MG tablet Take 1 tablet (20 mg total) by mouth daily. 90 tablet 3  . B Complex Vitamins (B COMPLEX PO) Take by mouth.    . calcium carbonate (OS-CAL) 600 MG TABS Take 600 mg by mouth  daily.     . conjugated estrogens (PREMARIN) vaginal cream Place 1 Applicatorful vaginally daily. Use pea size amount vaginally 3 times per week the first week, then 1-2 times per week 42.5 g 2  . [START ON 03/31/2016] estradiol (VIVELLE-DOT) 0.1 MG/24HR patch PLACE 1 PATCH (0.1 MG TOTAL) ONTO THE SKIN 2 (TWO) TIMES A WEEK. 8 patch 0  . fish oil-omega-3 fatty acids 1000 MG capsule Take 2 g by mouth daily.    . Multiple Vitamin (MULTIVITAMIN) tablet Take 1 tablet by mouth daily.     Marland Kitchen NEXIUM 40 MG capsule Take 1 capsule by mouth daily. Reported on 12/02/2015     No current facility-administered medications for this visit.     Family History  Problem Relation Age of Onset  . Hypertension Mother   . Dementia Mother   . Colon cancer Mother 63  . Deep vein thrombosis Father   . Hypertension  Father   . Cancer Father     prostate CA  . Heart disease Father 24    CAD  . Hypertension Sister   . Hyperlipidemia Sister   . Hypertension Brother   . Hyperlipidemia Brother   . Hypertension Maternal Aunt   . Hyperlipidemia Maternal Aunt   . Rheum arthritis Maternal Aunt   . Diabetes Maternal Aunt     1 with type II, 1 with type 1  . Hypertension Brother   . Diabetes Paternal Aunt     type II    ROS:  Pertinent items are noted in HPI.  Otherwise, a comprehensive ROS was negative.  Exam:   BP 108/66 (BP Location: Right Arm, Patient Position: Sitting, Cuff Size: Normal)   Pulse 64   Ht 5\' 4"  (1.626 m)   Wt 126 lb (57.2 kg)   LMP 10/25/2012 (Approximate)   BMI 21.63 kg/m  Height: 5\' 4"  (162.6 cm) Ht Readings from Last 3 Encounters:  03/30/16 5\' 4"  (1.626 m)  03/09/16 5\' 4"  (1.626 m)  10/13/15 5' 4.5" (1.638 m)    General appearance: alert, cooperative and appears stated age Head: Normocephalic, without obvious abnormality, atraumatic Neck: no adenopathy, supple, symmetrical, trachea midline and thyroid normal to inspection and palpation Lungs: clear to auscultation bilaterally Breasts: normal appearance, no masses or tenderness Heart: regular rate and rhythm Abdomen: soft, non-tender; no masses,  no organomegaly Extremities: extremities normal, atraumatic, no cyanosis or edema Skin: Skin color, texture, turgor normal. No rashes or lesions Lymph nodes: Cervical, supraclavicular, and axillary nodes normal. No abnormal inguinal nodes palpated Neurologic: Grossly normal   Pelvic: External genitalia:  no lesions              Urethra:  normal appearing urethra with no masses, tenderness or lesions              Bartholin's and Skene's: normal                 Vagina: normal appearing vagina with normal color and discharge, no lesions              Cervix: absent              Pap taken: No. Bimanual Exam:  Uterus:  uterus absent              Adnexa: no mass, fullness,  tenderness               Rectovaginal: Confirms               Anus:  normal sphincter tone, no lesions  Chaperone present: yes  A:  Well Woman with normal exam      S/P Robotic lap hysterectomy & salpingectomy secondary to fibroids 10/2012 - on ERT             S/P umbilical hernia repair with mesh 10/2013             History of anemia, gluten intolerance, mild osteopenia   P:   Reviewed health and wellness pertinent to exam  Pap smear as above  Mammogram is due 01/2017  Refill on Vivelle dot twice a week  Counseled with risk of DVT, CVA, cancer,etc  Will use OTC lubrication prn  Will follow with labs  Counseled on breast self exam, mammography screening, use and side effects of HRT, adequate intake of calcium and vitamin D, diet and exercise, Kegel's exercises return annually or prn  An After Visit Summary was printed and given to the patient.

## 2016-03-30 NOTE — Telephone Encounter (Signed)
Medication refill request: Vivelle Last AEX:  02-2315  Next AEX: 03-30-16 Last MMG (if hormonal medication request): 01-28-16 WNL  Refill authorized: please advise

## 2016-03-30 NOTE — Patient Instructions (Signed)

## 2016-03-31 LAB — HEPATITIS C ANTIBODY: HCV Ab: NEGATIVE

## 2016-04-05 NOTE — Progress Notes (Signed)
Encounter reviewed by Dr. Terryn Redner Amundson C. Silva.  

## 2016-05-06 ENCOUNTER — Encounter: Payer: Self-pay | Admitting: Medical

## 2016-05-06 ENCOUNTER — Ambulatory Visit (INDEPENDENT_AMBULATORY_CARE_PROVIDER_SITE_OTHER): Payer: BC Managed Care – PPO | Admitting: Medical

## 2016-05-06 VITALS — BP 106/72 | HR 74 | Wt 125.4 lb

## 2016-05-06 DIAGNOSIS — R198 Other specified symptoms and signs involving the digestive system and abdomen: Secondary | ICD-10-CM | POA: Insufficient documentation

## 2016-05-06 DIAGNOSIS — Z8261 Family history of arthritis: Secondary | ICD-10-CM | POA: Insufficient documentation

## 2016-05-06 DIAGNOSIS — E785 Hyperlipidemia, unspecified: Secondary | ICD-10-CM | POA: Diagnosis not present

## 2016-05-06 DIAGNOSIS — M2550 Pain in unspecified joint: Secondary | ICD-10-CM

## 2016-05-06 DIAGNOSIS — I10 Essential (primary) hypertension: Secondary | ICD-10-CM

## 2016-05-06 DIAGNOSIS — M791 Myalgia, unspecified site: Secondary | ICD-10-CM | POA: Insufficient documentation

## 2016-05-06 DIAGNOSIS — Z862 Personal history of diseases of the blood and blood-forming organs and certain disorders involving the immune mechanism: Secondary | ICD-10-CM

## 2016-05-06 DIAGNOSIS — M7989 Other specified soft tissue disorders: Secondary | ICD-10-CM | POA: Insufficient documentation

## 2016-05-06 DIAGNOSIS — R7301 Impaired fasting glucose: Secondary | ICD-10-CM

## 2016-05-06 DIAGNOSIS — M2560 Stiffness of unspecified joint, not elsewhere classified: Secondary | ICD-10-CM | POA: Diagnosis not present

## 2016-05-06 LAB — LIPID PANEL
Cholesterol: 207 mg/dL — ABNORMAL HIGH (ref <200)
HDL: 79 mg/dL (ref >50)
LDL Cholesterol: 89 mg/dL (ref <100)
Total CHOL/HDL Ratio: 2.6 Ratio (ref <5.0)
Triglycerides: 195 mg/dL — ABNORMAL HIGH (ref <150)
VLDL: 39 mg/dL — ABNORMAL HIGH (ref <30)

## 2016-05-06 LAB — TSH: TSH: 0.8 mIU/L

## 2016-05-06 NOTE — Patient Instructions (Signed)
Massage Therapy:  Janet Blevins [facility] [address] [place], [place] [address] [phone] [email or web address]   

## 2016-05-06 NOTE — Progress Notes (Signed)
Subjective: Chief Complaint  Patient presents with  . facial pain ,tightness in hands    rt side face painx2 week  tightness in hands x1 month , pain head   Here for pain.  She notes for years has pain all over, but this has gotten commonplace.  She is here for worse pain of late in hands, fingers.   In the mornings hands feel tight, swollen and painful, usually fingers, can be both or either hand.  Can't fully close the fingers at times.  Get better throughout the day.  Gets other joint stiffness, ongoing.  Takes about an hour for joints to loosen up.  In the mornings, bottom of feet hurt.   Sister has been diagnosed with fibromyalgia.  Brother has RA, aunt has RA.    She notes having some loss of sensation of right face at times.  Gets some pains in behind right ear.  Has pain over right orbit and at TMJ.  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  Has inquired about mouth guard, $500 per dentist.   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     Past Medical History:  Diagnosis Date  . Abnormal Pap smear 1988   treated with cryo  . Anemia 11/2010   hematology consult prior; etiology malabsorption and uterine bleeding  . Anxiety   . Gastric polyp   . GERD (gastroesophageal reflux disease)   . H/O bone density study 12/2007  . H/O hysterectomy for benign disease 5/14  . History of echocardiogram 03/2010   normal LV function, EF 60-65%, mild left atrial enlargement  . HTN (hypertension) 03/2010   hospitalization for HTN urgency  . Hyperlipemia   . Internal hemorrhoids   . Lumbar degenerative disc disease   . Migraine   . Myalgia   . Spondylosis, cervical   . Umbilical hernia   . Uterine fibroid    hx/o    Current Outpatient Prescriptions on File Prior to Visit  Medication Sig Dispense Refill  . amLODipine (NORVASC) 5 MG tablet TAKE 1 TABLET BY  MOUTH EVERY DAY 90 tablet 1  . Ascorbic Acid (VITAMIN C) 1000 MG tablet Take 1,000 mg by mouth daily.     Marland Kitchen aspirin 81 MG EC tablet TAKE 1 TABLET (81 MG TOTAL) BY MOUTH DAILY. 90 tablet 2  . atorvastatin (LIPITOR) 20 MG tablet Take 1 tablet (20 mg total) by mouth daily. 90 tablet 3  . B Complex Vitamins (B COMPLEX PO) Take by mouth.    . calcium carbonate (OS-CAL) 600 MG TABS Take 600 mg by mouth daily.     Marland Kitchen conjugated estrogens (PREMARIN) vaginal cream Place 1 Applicatorful vaginally daily. Use pea size amount vaginally 3 times per week the first week, then 1-2 times per week 42.5 g 2  . estradiol (VIVELLE-DOT) 0.1 MG/24HR patch Place 1 patch (0.1 mg total) onto the skin 2 (two) times a week. 12 patch 4  . fish oil-omega-3 fatty acids 1000 MG capsule Take 2 g by mouth daily.    . Multiple Vitamin (MULTIVITAMIN) tablet Take 1 tablet by mouth daily.     Marland Kitchen NEXIUM 40 MG capsule Take 1 capsule by mouth daily. Reported on 12/02/2015     No current facility-administered medications on file prior to visit.    Past Surgical History:  Procedure Laterality Date  .  CERVICAL FUSION  2010  . CERVIX LESION DESTRUCTION  1988  . CESAREAN SECTION    . CHOLECYSTECTOMY  2003  . COLONOSCOPY  08/2010   Dr. Collene Mares  . ESOPHAGOGASTRODUODENOSCOPY  08/2010   Dr. Collene Mares  . EXPLORATORY LAPAROTOMY    . MYOMECTOMY  1998  . PELVIC LAPAROSCOPY    . ROBOTIC ASSISTED LAPAROSCOPIC HYSTERECTOMY AND SALPINGECTOMY  10/2012   [facility]; laproscopic due to fibroids  . UMBILICAL HERNIA REPAIR  10/2013   infected laparoscopic port, mesh placement   Family History  Problem Relation Age of Onset  . Hypertension Mother   . Dementia Mother   . Colon cancer Mother 29  . Deep vein thrombosis Father   . Hypertension Father   . Cancer Father     prostate CA  . Heart disease Father 29    CAD  . Hypertension Sister   . Hyperlipidemia Sister   . Hypertension Brother   . Hyperlipidemia Brother   . Hypertension Maternal Aunt   .  Hyperlipidemia Maternal Aunt   . Rheum arthritis Maternal Aunt   . Diabetes Maternal Aunt     1 with type II, 1 with type 1  . Hypertension Brother   . Rheum arthritis Brother   . Diabetes Paternal Aunt     type II     ROS as in subjective   Objective: BP 106/72   Pulse 74   Wt 125 lb 6.4 oz (56.9 kg)   LMP 10/25/2012 (Approximate)   SpO2 97%   BMI 21.52 kg/m   General appearance: alert, no distress, WD/WN, lean petite AA female Tender bilat TMJs, tender postauricular area on right, no other specific facial tenderness HEENT: normocephalic, sclerae anicteric, PERRLA, EOMi, TMs flat, nares patent, no discharge or erythema, pharynx normal Oral cavity: MMM, no lesions Neck:tender right lateral neck, normal ROM, supple, no lymphadenopathy, no thyromegaly, no masses Heart: RRR, normal S1, S2, no murmurs Lungs: CTA bilaterally, no wheezes, rhonchi, or rales Back: generalized paraspinal tenderness Musculoskeletal: mild generalized tenderness of extremities, but no obvious joint swelling or bony arthritis changes of fingers or other, no obvious deformity Extremities: no edema, no cyanosis, no clubbing Pulses: 2+ symmetric, upper and lower extremities, normal cap refill Neurological: alert, oriented x 3, CN2-12 intact, strength normal upper extremities and lower extremities, sensation normal throughout, DTRs 2+ throughout, no cerebellar signs, gait normal Psychiatric: normal affect, behavior normal, pleasant    Assessment: Encounter Diagnoses  Name Primary?  . Polyarthralgia Yes  . Clenching of teeth   . Finger swelling   . Morning stiffness of joints   . Myalgia   . History of anemia   . Essential hypertension   . Family history of rheumatoid arthritis   . Impaired fasting glucose   . Hyperlipidemia, unspecified hyperlipidemia type     Plan: Labs today.  Reviewed recent CMET, CBC, and uric acid in chart.   Advised she stop Lipitor for 2-3 weeks to see if this helps the  myalgias  Cut amlodipine to 1/2 tablet for the next 3-4 weeks as a trial.  BPs running a little low  advised daily stretching and exercise.   Hydrate well.   C/t healthy diet.  Advised daily multivitamin.  C/t massage therapy  She declines muscle relaxer.    Pending labs, consider rheumatology consult  Luna was seen today for facial pain ,tightness in hands.  Diagnoses and all orders for this visit:  Polyarthralgia -     CK -  Cyclic citrul peptide antibody, IgG -     Rheumatoid factor -     Vitamin B12 -     VITAMIN D 25 Hydroxy (Vit-D Deficiency, Fractures) -     ANA -     TSH -     Hemoglobin A1c  Clenching of teeth  Finger swelling  Morning stiffness of joints  Myalgia  History of anemia  Essential hypertension -     Hemoglobin A1c  Family history of rheumatoid arthritis -     CK -     Cyclic citrul peptide antibody, IgG -     Rheumatoid factor -     VITAMIN D 25 Hydroxy (Vit-D Deficiency, Fractures) -     ANA  Impaired fasting glucose  Hyperlipidemia, unspecified hyperlipidemia type -     Lipid panel

## 2016-05-07 LAB — HEMOGLOBIN A1C
Hgb A1c MFr Bld: 5.6 % (ref <5.7)
Mean Plasma Glucose: 114 mg/dL

## 2016-05-07 LAB — VITAMIN B12: Vitamin B-12: 486 pg/mL (ref 200–1100)

## 2016-05-07 LAB — CK: Total CK: 125 U/L (ref 7–177)

## 2016-05-07 LAB — VITAMIN D 25 HYDROXY (VIT D DEFICIENCY, FRACTURES): Vit D, 25-Hydroxy: 78 ng/mL (ref 30–100)

## 2016-05-09 LAB — CYCLIC CITRUL PEPTIDE ANTIBODY, IGG: Cyclic Citrullin Peptide Ab: <16 Units

## 2016-05-09 LAB — ANTI-NUCLEAR AB-TITER (ANA TITER): ANA Titer 1: 1:320 {titer} — ABNORMAL HIGH

## 2016-05-09 LAB — ANA: Anti Nuclear Antibody(ANA): POSITIVE — AB

## 2016-05-09 LAB — RHEUMATOID FACTOR: Rhuematoid fact SerPl-aCnc: <14 IU/mL (ref <14)

## 2016-05-10 ENCOUNTER — Telehealth: Payer: Self-pay

## 2016-05-10 ENCOUNTER — Telehealth: Payer: Self-pay | Admitting: Medical

## 2016-05-10 DIAGNOSIS — M2550 Pain in unspecified joint: Secondary | ICD-10-CM

## 2016-05-11 NOTE — Telephone Encounter (Signed)
I called patient back to discuss her lab results and referral to rheumatology

## 2016-05-17 NOTE — Telephone Encounter (Signed)
Referred pt to rehum

## 2016-05-23 ENCOUNTER — Other Ambulatory Visit: Payer: Self-pay | Admitting: Medical

## 2016-05-24 ENCOUNTER — Other Ambulatory Visit: Payer: Self-pay | Admitting: Nurse Practitioner

## 2016-06-13 ENCOUNTER — Telehealth: Payer: Self-pay | Admitting: Rheumatology

## 2016-06-13 NOTE — Telephone Encounter (Signed)
Patient called about status of referral. Has it been reviewed?

## 2016-06-14 NOTE — Telephone Encounter (Signed)
Reviewed and okay to schedule new patient appointment and new patient follow up but will not treat Fibromyalgia.

## 2016-07-05 ENCOUNTER — Telehealth: Payer: Self-pay | Admitting: Medical

## 2016-07-05 ENCOUNTER — Ambulatory Visit: Payer: BC Managed Care – PPO | Admitting: Medical

## 2016-07-05 NOTE — Telephone Encounter (Signed)
ok 

## 2016-07-05 NOTE — Telephone Encounter (Signed)
pls triage her today instead of Tarsh, or get Gabriel Cirri to do this

## 2016-07-05 NOTE — Telephone Encounter (Signed)
Pt being seen today and she would like for all results to be sent to a different CMA and also be taken back to her room by a different CMA other than Tarsha each time she comes in

## 2016-07-08 ENCOUNTER — Encounter: Payer: Self-pay | Admitting: Medical

## 2016-08-23 ENCOUNTER — Ambulatory Visit (INDEPENDENT_AMBULATORY_CARE_PROVIDER_SITE_OTHER): Payer: BC Managed Care – PPO | Admitting: Medical

## 2016-08-23 VITALS — BP 120/70 | HR 77 | Temp 98.6°F | Wt 125.0 lb

## 2016-08-23 DIAGNOSIS — R6889 Other general symptoms and signs: Secondary | ICD-10-CM

## 2016-08-23 DIAGNOSIS — R10819 Abdominal tenderness, unspecified site: Secondary | ICD-10-CM

## 2016-08-23 DIAGNOSIS — R112 Nausea with vomiting, unspecified: Secondary | ICD-10-CM | POA: Diagnosis not present

## 2016-08-23 DIAGNOSIS — R110 Nausea: Secondary | ICD-10-CM

## 2016-08-23 MED ORDER — PROMETHAZINE HCL 25 MG PO TABS: 25.0000 mg | ORAL_TABLET | Freq: Three times a day (TID) | ORAL | 0 refills | Status: DC | PRN

## 2016-08-23 MED ORDER — OSELTAMIVIR PHOSPHATE 75 MG PO CAPS: 75.0000 mg | ORAL_CAPSULE | Freq: Two times a day (BID) | ORAL | 0 refills | Status: DC

## 2016-08-23 NOTE — Patient Instructions (Signed)
Recommendations: Assume the flu and stomach virus based on your symptoms  REST  Drink plenty of fluids throughout the day such as water, gingerale, Gatorade, soup broth, etc Use tylenol or ibuprofen for fever, body aches, chills You can use Promethazine every 6 hours as needed for nauseas/vomiting Begin Tamiflu twice daily for flu symptoms If worse or not improving in the next 48 hours or if new symptoms, then call or return     Your diagnosis today includes: Encounter Diagnoses  Name Primary?  . Flu-like symptoms Yes  . Nausea   . Abdominal tenderness, rebound tenderness presence not specified, unspecified location   . Nausea and vomiting, intractability of vomiting not specified, unspecified vomiting type     Medications prescribed today:   Specific home care recommendations today include:  Only take over-the-counter (OTC) or prescription medicines for pain, discomfort, or fever as directed by your caregiver.    Decongestant: You may use OTC Guaifenesin (Mucinex plain) for congestion.  You may use Pseudoephedrine (Sudafed) only if you don't have blood pressure problems or a diagnosis of hypertension.  Cough suppression: If you have cough from drainage, you may use over-the-counter Dextromethorphan (Delsym) as directed on the label  Sore throat remedies:  You may use salt water gargles, warm fluids such as coffee or hot tea, or honey/tea/lemon mixture to sooth sore throat pain.  You may use OTC sore throat remedies such as Cepacol lozenges or Chloraseptic spray for sore throat pain.  Runny nose and sneezing remedies: You may use OTC antihistamine such as Zyrtec or Benadryl, but caution as these can cause drowsiness.    Pain/fever relief: You may use over-the-counter Tylenol for pain or fever  Drink extra fluids. Fluids help thin the mucus so your sinuses can drain more easily.   Applying either moist heat or ice packs to the sinus areas may help relieve discomfort.  Use  saline nasal sprays to help moisten your sinuses. The sprays can be found at your local drugstore.    Please call or return if worse or not improving in the next few days.    Medication costs:  If you get to the pharmacy and medication prescribed today was either too expensive, not covered by your insurance, or required prior authorization, then please call us back to let us know.  We often have no way to know if a medication is too expensive or not covered by your insurance.  Thanks for your cooperation.   No Follow-up on file.    I have included other useful information below for your review.   Influenza, Adult Influenza ("the flu") is a viral infection of the respiratory tract. It causes chills, fever, cough, headache, body aches, and sore throat. Influenza in general will make you feel sicker than when you have a common cold. Symptoms of the illness typically last a few days. Cough and fatigue may continue for as long as 7 to 10 days. Influenza is highly contagious. It spreads easily to others in the droplets from coughs and sneezes. People frequently become infected by touching something that was recently contaminated with the virus and then touch their mouth, nose or eyes. This infection is caused by a virus. Symptoms will not be reduced or improved by taking an antibiotic. Antibiotics are medications that kill bacteria, not viruses. DIAGNOSIS  Diagnosis of influenza is often made based on the history and physical examination as well as the presence of influenza reports occurring in your community. Testing can be done if  the diagnosis is not certain. TREATMENT  Since influenza is caused by a virus, antibiotics are not helpful. Your caregiver may prescribe antiviral medicines to shorten the illness and lessen the severity. Your caregiver may also recommend influenza vaccination and/or antiviral medicines for your family members in order to prevent the spread of influenza to them. HOME CARE  INSTRUCTIONS  DO NOT GIVE ASPIRIN TO PERSONS WITH INFLUENZA WHO ARE UNDER 71 YEARS OF AGE. This could lead to brain and liver damage (Reye's syndrome). Read the label on over-the-counter medicines.   Stay home from work or school if at all possible until most of your symptoms are gone.   Only take over-the-counter or prescription medicines for pain, discomfort, or fever as directed by your caregiver.   Use a cool mist humidifier to increase air moisture. This will make breathing easier.   Rest until your temperature is nearly normal: 98.6 F (37 C). This usually takes 3 to 4 days. Be sure you get plenty of rest.   Drink at least eight, eight-ounce glasses of fluids per day. Fluids include water, juice, broth, gelatin, or lemonade.   Cover your mouth and nose when coughing or sneezing and wash your hands often to prevent the spread of this virus to other persons.  PREVENTION  Annual influenza vaccination (flu shots) is the best way to avoid getting influenza. An annual flu shot is now routinely recommended for all adults in the [place] IF:   You develop shortness of breath while resting.   You have a deep cough with production of mucous or chest pain.   You develop nausea (feeling sick to your stomach), vomiting, or diarrhea.  SEEK IMMEDIATE MEDICAL CARE IF:   You have difficulty breathing, become short of breath, or your skin or nails turn bluish.   You develop severe neck pain or stiffness.   You develop a severe headache, facial pain, or earache.   You have a fever.   You develop nausea or vomiting that cannot be controlled.  Document Released: 06/10/2000 Document Revised: 02/23/2011 Document Reviewed: 04/15/2009 [facility] Patient Information 2012 [place].

## 2016-08-23 NOTE — Progress Notes (Signed)
Subjective:  Shari Lawrence is a 56 y.o. female who presents for illness.   Started feeling bad 4 days ago, stomach felt strange.  Thought it was something she ate.   Stomach has continued to feel bad.   She was watching her God son and he tested + for flu this weekend, so she was exposed.    Has body aches, chills, no fever, nausea, vomited once this morning.   No loose stool but thinks its coming.  No cough, no runny nose, no sneezing.  Has had some ear pain.   No rash.   No sore throat.   Low back pain.   This morning achy, throwing up, ears hurt, neck hurts.   Came on suddenly with symptoms this past night in middle of night.  No other aggravating or relieving factors.  No other c/o.  The following portions of the patient's history were reviewed and updated as appropriate: allergies, current medications, past medical history, past social history and problem list.  ROS as in subjective   Past Medical History:  Diagnosis Date  . Abnormal Pap smear 1988   treated with cryo  . Anemia 11/2010   hematology consult prior; etiology malabsorption and uterine bleeding  . Anxiety   . Gastric polyp   . GERD (gastroesophageal reflux disease)   . H/O bone density study 12/2007  . H/O hysterectomy for benign disease 5/14  . History of echocardiogram 03/2010   normal LV function, EF 60-65%, mild left atrial enlargement  . HTN (hypertension) 03/2010   hospitalization for HTN urgency  . Hyperlipemia   . Internal hemorrhoids   . Lumbar degenerative disc disease   . Migraine   . Myalgia   . Spondylosis, cervical   . Umbilical hernia   . Uterine fibroid    hx/o      Objective: BP 120/70   Pulse 77   Temp 98.6 F (37 C) (Oral)   Wt 125 lb (56.7 kg)   LMP 10/25/2012 (Approximate)   BMI 21.46 kg/m   General: Ill-appearing, well-developed, well-nourished Skin: warm, dry HEENT: Nose clear, clear conjunctiva, TMs pearly, no sinus tenderness, pharynx with erythema, no exudates Neck: Supple,  non tender, shotty cervical adenopathy Heart: Regular rate and rhythm, normal S1, S2, no murmurs Lungs: Clear to auscultation bilaterally, no wheezes, rales, rhonchi Abdomen: mild generalized tenderness, no mass, no organomegaly Extremities: Mild generalized tenderness   Assessment: Encounter Diagnoses  Name Primary?  . Flu-like symptoms Yes  . Nausea   . Abdominal tenderness, rebound tenderness presence not specified, unspecified location   . Nausea and vomiting, intractability of vomiting not specified, unspecified vomiting type      Plan: Etiology likely flu vs gastroenteritis.  declines other testing.  She has flu + contact.  Prescription given for Tamiflu, discussed risks/benefits of medication.    Discussed diagnosis of influenza vs gastroenteritis. Discussed supportive care including rest, hydration, OTC Tylenol or NSAID for fever, aches, and malaise.  Discussed period of contagion, self quarantine at home away from others to avoid spread of disease, discussed means of transmission, and possible complications including pneumonia.  If worse or not improving within the next 4-5 days, then call or return.  Can use Promethazine for nausea/vomiting.     Patient voiced understanding of diagnosis, recommendations, and treatment plan.  After visit summary given.  Gave note for work.  Shari Lawrence was seen today for sick.  Diagnoses and all orders for this visit:  Flu-like symptoms -  POC Influenza A&B(BINAX/QUICKVUE)  Nausea  Abdominal tenderness, rebound tenderness presence not specified, unspecified location  Nausea and vomiting, intractability of vomiting not specified, unspecified vomiting type  Other orders -     oseltamivir (TAMIFLU) 75 MG capsule; Take 1 capsule (75 mg total) by mouth 2 (two) times daily. -     promethazine (PHENERGAN) 25 MG tablet; Take 1 tablet (25 mg total) by mouth every 8 (eight) hours as needed for nausea or vomiting.

## 2016-08-25 LAB — POC INFLUENZA A&B (BINAX/QUICKVUE)
Influenza A, POC: NEGATIVE
Influenza B, POC: NEGATIVE

## 2016-08-26 NOTE — Progress Notes (Signed)
Office Visit Note  Patient: Shari Lawrence             Date of Birth: July 03, 1960           MRN: [id number]             PCP: Crisoforo Oxford, PA-C Referring: Carlena Hurl, PA-C Visit Date: 08/30/2016 Occupation: Professor at [employer]    Subjective:  Pain and swelling in hands   History of Present Illness: Shari Lawrence is a 56 y.o. female seen in consultation per request of her PCP for positive ANA. According to patient she has had history of morning stiffness for multiple years. In April 2017 she started noticing increased pain and swelling in her hands and tightness. She also noticed a facial rash for which she was seen by dermatologist and was given some topical steroids. It controls her symptoms.She also noticed some nasal ulcers for which she uses topical cream. She has intermittent oral ulcers. Recently she's been experiencing pain in her bilateral elbows, bilateral knee joints and bilateral hips. She describes increased pain in her right hip over left and especially at nighttime and increased pain in her right knee joint. She states she's under a lot of stress she is caregiver for both of her parents were living with her currently.  Activities of Daily Living:  Patient reports morning stiffness for 30 minutes.   Patient Reports nocturnal pain.  Difficulty dressing/grooming: Denies Difficulty climbing stairs: Reports Difficulty getting out of chair: Reports Difficulty using hands for taps, buttons, cutlery, and/or writing: Denies   Review of Systems  Constitutional: Positive for fatigue. Negative for night sweats, weight gain, weight loss and weakness.  HENT: Positive for mouth sores and mouth dryness. Negative for trouble swallowing, trouble swallowing and nose dryness.        Nasal ulcers  Eyes: Negative for pain, redness, visual disturbance and dryness.  Respiratory: Negative for cough, shortness of breath and difficulty breathing.   Cardiovascular: Negative for chest  pain, palpitations, hypertension, irregular heartbeat and swelling in legs/feet.  Gastrointestinal: Negative for blood in stool, constipation and diarrhea.  Endocrine: Negative for increased urination.  Genitourinary: Negative for vaginal dryness.  Musculoskeletal: Positive for arthralgias, joint pain, myalgias, morning stiffness and myalgias. Negative for joint swelling, muscle weakness and muscle tenderness.  Skin: Positive for rash. Negative for color change, hair loss, skin tightness, ulcers and sensitivity to sunlight.  Allergic/Immunologic: Negative for susceptible to infections.  Neurological: Negative for dizziness, memory loss and night sweats.  Hematological: Negative for swollen glands.  Psychiatric/Behavioral: Positive for sleep disturbance. Negative for depressed mood. The patient is not nervous/anxious.     PMFS History:  Patient Active Problem List   Diagnosis Date Noted  . Polyarthralgia 05/06/2016  . Clenching of teeth 05/06/2016  . Finger swelling 05/06/2016  . Morning stiffness of joints 05/06/2016  . Myalgia 05/06/2016  . Family history of rheumatoid arthritis 05/06/2016  . Essential hypertension 12/23/2014  . Hyperlipidemia 12/23/2014  . History of anemia 12/23/2014  . Care provider for parents 12/23/2014  . Impaired fasting glucose 10/25/2011  . Gluten intolerance 10/25/2011  . GERD (gastroesophageal reflux disease) 10/25/2011  . Essential hypertension, benign 10/25/2011  . Anxiety state   . Iron deficiency anemia     Past Medical History:  Diagnosis Date  . Abnormal Pap smear 1988   treated with cryo  . Anemia 11/2010   hematology consult prior; etiology malabsorption and uterine bleeding  . Anxiety   . Gastric  polyp   . GERD (gastroesophageal reflux disease)   . H/O bone density study 12/2007  . H/O hysterectomy for benign disease 5/14  . History of echocardiogram 03/2010   normal LV function, EF 60-65%, mild left atrial enlargement  . HTN  (hypertension) 03/2010   hospitalization for HTN urgency  . Hyperlipemia   . Internal hemorrhoids   . Lumbar degenerative disc disease   . Migraine   . Myalgia   . Spondylosis, cervical   . Umbilical hernia   . Uterine fibroid    hx/o     Family History  Problem Relation Age of Onset  . Hypertension Mother   . Dementia Mother   . Colon cancer Mother 22  . Deep vein thrombosis Father   . Hypertension Father   . Cancer Father     prostate CA  . Heart disease Father 74    CAD  . Hypertension Sister   . Hyperlipidemia Sister   . Hypertension Brother   . Hyperlipidemia Brother   . Hypertension Maternal Aunt   . Hyperlipidemia Maternal Aunt   . Rheum arthritis Maternal Aunt   . Diabetes Maternal Aunt     1 with type II, 1 with type 1  . Hypertension Brother   . Rheum arthritis Brother   . Diabetes Paternal Aunt     type II   Past Surgical History:  Procedure Laterality Date  . CERVICAL FUSION  2010  . CERVIX LESION DESTRUCTION  1988  . CESAREAN SECTION    . CHOLECYSTECTOMY  2003  . COLONOSCOPY  08/2010   Dr. Collene Mares  . ESOPHAGOGASTRODUODENOSCOPY  08/2010   Dr. Collene Mares  . EXPLORATORY LAPAROTOMY    . MYOMECTOMY  1998  . PELVIC LAPAROSCOPY    . ROBOTIC ASSISTED LAPAROSCOPIC HYSTERECTOMY AND SALPINGECTOMY  10/2012   [facility]; laproscopic due to fibroids  . 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  Has 68 yo daughter.  Her siblings and her don't agree on helping take care of their parents, causes family tension.  As of 11/2014     Objective: Vital Signs: BP 118/68   Pulse 72   Resp 14   Ht 5\' 4"  (1.626 m)   Wt 124 lb (56.2 kg)   LMP 10/25/2012 (Approximate)    BMI 21.28 kg/m    Physical Exam  Constitutional: She is oriented to person, place, and time. She appears well-developed and well-nourished.  HENT:  Head: Normocephalic and atraumatic.  Eyes: Conjunctivae and EOM are normal.  Neck: Normal range of motion.  Cardiovascular: Normal rate, regular rhythm, normal heart sounds and intact distal pulses.   Pulmonary/Chest: Effort normal and breath sounds normal.  Abdominal: Soft. Bowel sounds are normal.  Lymphadenopathy:    She has no cervical adenopathy.  Neurological: She is alert and oriented to person, place, and time.  Skin: Skin is warm and dry. Capillary refill takes less than 2 seconds.  Psychiatric: She has a normal mood and affect. Her behavior is normal.  Nursing note and vitals reviewed.    Musculoskeletal Exam: C-spine and thoracic lumbar spine good range of motion. She is some discomfort range of motion of her lumbar spine. Shoulder joints elbow joints  wrist joint MCPs PIPs DIPs with good range of motion with no synovitis noted tenderness on examination. She is good range of motion of her hip joints and tenderness over trochanteric bursa area. She has discomfort range of motion of her knee joints without any warmth swelling or effusion. MTPs were good range of motion with no tenderness or warmth or swelling.  CDAI Exam: No CDAI exam completed.    Investigation: Findings:  September 2017 CBC normal, CMP normal, uric acid 4.0 05/06/2016 lipid panel triglycerides 195, TSH norma, l vitamin D 78, B12 normal, CK normal, hep C negative, RF negative, anti-CCP negative, ANA 1:320 nucleolar    Imaging: Xr Hand 2 View Left  Result Date: 08/30/2016 No MCP joint narrowing or intercarpal joint space narrowing was noted she has mild PIP narrowing. 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 No chondrocalcinosis. 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 No chondrocalcinosis. 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    Procedures:  No procedures performed Allergies: Quinolones; Gluten meal; Latex; Penicillins; Percocet [oxycodone-acetaminophen]; Sulfa drugs cross reactors; and Vicodin [hydrocodone-acetaminophen]   Assessment / Plan:     Visit Diagnoses: Polyarthralgia - ANA 1:320 nucleolar, RF negative, anti-CCP negative. Patient gives history of morning stiffness, pain and discomfort in her hands hips knees and feet. Difficulty making a fist in the morning. History of facial rash for which she uses topical steroids. History of nasal ulcers and oral ulcers. I would like to obtain some additional labs to evaluate this further.  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 - Plan: XR KNEE 3 VIEW RIGHT, XR KNEE 3 VIEW LEFT  Trochanteric bursitis of both hips: Stretching exercises were discussed.  Myalgia: Generalized  Essential hypertension  Hyperlipidemia, unspecified hyperlipidemia type  Gastroesophageal reflux disease without esophagitis  Anxiety state  Other iron deficiency anemia  Family history of lupus erythematosus -  second cousin  Family history of rheumatoid arthritis    Orders: Orders Placed This Encounter  Procedures    . XR Hand 2 View Right  . XR Hand 2 View Left  . XR KNEE 3 VIEW RIGHT  . XR KNEE 3 VIEW LEFT  . CBC with Differential/Platelet  . COMPLETE METABOLIC PANEL WITH GFR  . Urinalysis, Routine w reflex microscopic  . Sedimentation rate  . Protein electrophoresis, serum  . Beta-2 glycoprotein antibodies  . Lupus anticoagulant panel  . Cardiolipin antibodies, IgG, IgM, IgA  . C3 and C4  . [id number] ENA PANEL  . Glucose 6 phosphate dehydrogenase   No orders of the defined types were placed in this encounter.   Face-to-face time spent with patient was 50 minutes. 50% of time was spent in counseling and coordination of care.  Follow-Up Instructions: Return for Polyarthralgia, positive ANA.   [name], MD  Note - This record has been created using [employer].  Chart creation errors have been sought, but may not always  have been located. Such creation errors do not reflect on  the standard of medical care.

## 2016-08-30 ENCOUNTER — Ambulatory Visit (INDEPENDENT_AMBULATORY_CARE_PROVIDER_SITE_OTHER): Payer: Self-pay

## 2016-08-30 ENCOUNTER — Encounter: Payer: Self-pay | Admitting: Rheumatology

## 2016-08-30 ENCOUNTER — Ambulatory Visit (INDEPENDENT_AMBULATORY_CARE_PROVIDER_SITE_OTHER): Payer: BC Managed Care – PPO | Admitting: Rheumatology

## 2016-08-30 VITALS — BP 118/68 | HR 72 | Resp 14 | Ht 64.0 in | Wt 124.0 lb

## 2016-08-30 DIAGNOSIS — M79642 Pain in left hand: Secondary | ICD-10-CM | POA: Diagnosis not present

## 2016-08-30 DIAGNOSIS — M25562 Pain in left knee: Secondary | ICD-10-CM

## 2016-08-30 DIAGNOSIS — G8929 Other chronic pain: Secondary | ICD-10-CM

## 2016-08-30 DIAGNOSIS — M791 Myalgia, unspecified site: Secondary | ICD-10-CM

## 2016-08-30 DIAGNOSIS — Z840 Family history of diseases of the skin and subcutaneous tissue: Secondary | ICD-10-CM | POA: Diagnosis not present

## 2016-08-30 DIAGNOSIS — M79641 Pain in right hand: Secondary | ICD-10-CM | POA: Diagnosis not present

## 2016-08-30 DIAGNOSIS — I10 Essential (primary) hypertension: Secondary | ICD-10-CM | POA: Diagnosis not present

## 2016-08-30 DIAGNOSIS — M25561 Pain in right knee: Secondary | ICD-10-CM

## 2016-08-30 DIAGNOSIS — E785 Hyperlipidemia, unspecified: Secondary | ICD-10-CM

## 2016-08-30 DIAGNOSIS — D508 Other iron deficiency anemias: Secondary | ICD-10-CM | POA: Diagnosis not present

## 2016-08-30 DIAGNOSIS — F411 Generalized anxiety disorder: Secondary | ICD-10-CM

## 2016-08-30 DIAGNOSIS — Z8261 Family history of arthritis: Secondary | ICD-10-CM | POA: Diagnosis not present

## 2016-08-30 DIAGNOSIS — M2550 Pain in unspecified joint: Secondary | ICD-10-CM | POA: Diagnosis not present

## 2016-08-30 DIAGNOSIS — M7061 Trochanteric bursitis, right hip: Secondary | ICD-10-CM

## 2016-08-30 DIAGNOSIS — K219 Gastro-esophageal reflux disease without esophagitis: Secondary | ICD-10-CM | POA: Diagnosis not present

## 2016-08-30 DIAGNOSIS — M7062 Trochanteric bursitis, left hip: Secondary | ICD-10-CM

## 2016-08-30 DIAGNOSIS — R768 Other specified abnormal immunological findings in serum: Secondary | ICD-10-CM

## 2016-08-30 LAB — CBC WITH DIFFERENTIAL/PLATELET
Basophils Absolute: 43 cells/uL (ref 0–200)
Basophils Relative: 1 %
Eosinophils Absolute: 129 cells/uL (ref 15–500)
Eosinophils Relative: 3 %
HCT: 38.4 % (ref 35.0–45.0)
Hemoglobin: 12.4 g/dL (ref 11.7–15.5)
Lymphocytes Relative: 45 %
Lymphs Abs: 1935 cells/uL (ref 850–3900)
MCH: 29.6 pg (ref 27.0–33.0)
MCHC: 32.3 g/dL (ref 32.0–36.0)
MCV: 91.6 fL (ref 80.0–100.0)
MPV: 11.5 fL (ref 7.5–12.5)
Monocytes Absolute: 258 cells/uL (ref 200–950)
Monocytes Relative: 6 %
Neutro Abs: 1935 cells/uL (ref 1500–7800)
Neutrophils Relative %: 45 %
Platelets: 202 10*3/uL (ref 140–400)
RBC: 4.19 MIL/uL (ref 3.80–5.10)
RDW: 14.3 % (ref 11.0–15.0)
WBC: 4.3 10*3/uL (ref 3.8–10.8)

## 2016-08-30 LAB — COMPLETE METABOLIC PANEL WITH GFR
ALT: 25 U/L (ref 6–29)
AST: 26 U/L (ref 10–35)
Albumin: 4.4 g/dL (ref 3.6–5.1)
Alkaline Phosphatase: 52 U/L (ref 33–130)
BUN: 9 mg/dL (ref 7–25)
CO2: 23 mmol/L (ref 20–31)
Calcium: 9.4 mg/dL (ref 8.6–10.4)
Chloride: 103 mmol/L (ref 98–110)
Creat: 0.82 mg/dL (ref 0.50–1.05)
GFR, Est African American: >89 mL/min (ref >=60)
GFR, Est Non African American: 81 mL/min (ref >=60)
Glucose, Bld: 66 mg/dL (ref 65–99)
Potassium: 4 mmol/L (ref 3.5–5.3)
Sodium: 141 mmol/L (ref 135–146)
Total Bilirubin: 0.3 mg/dL (ref 0.2–1.2)
Total Protein: 7.2 g/dL (ref 6.1–8.1)

## 2016-08-31 LAB — CP5000020 ENA PANEL
ENA SM Ab Ser-aCnc: <1
Ribonucleic Protein(ENA) Antibody, IgG: <1
SSA (Ro) (ENA) Antibody, IgG: <1
SSB (La) (ENA) Antibody, IgG: <1
Scleroderma (Scl-70) (ENA) Antibody, IgG: <1
ds DNA Ab: 8 IU/mL — ABNORMAL HIGH

## 2016-08-31 LAB — URINALYSIS, ROUTINE W REFLEX MICROSCOPIC
Bilirubin Urine: NEGATIVE
Glucose, UA: NEGATIVE
Hgb urine dipstick: NEGATIVE
Ketones, ur: NEGATIVE
Leukocytes, UA: NEGATIVE
Nitrite: NEGATIVE
Protein, ur: NEGATIVE
Specific Gravity, Urine: 1.019 (ref 1.001–1.035)
pH: 7 (ref 5.0–8.0)

## 2016-08-31 LAB — SEDIMENTATION RATE: Sed Rate: 12 mm/hr (ref 0–30)

## 2016-08-31 LAB — CARDIOLIPIN ANTIBODIES, IGG, IGM, IGA
Anticardiolipin IgA: <11 [APL'U]
Anticardiolipin IgG: 14 [GPL'U]
Anticardiolipin IgM: <12 [MPL'U]

## 2016-08-31 LAB — GLUCOSE 6 PHOSPHATE DEHYDROGENASE: G-6PDH: 15.1 U/g Hgb (ref 7.0–20.5)

## 2016-08-31 LAB — BETA-2 GLYCOPROTEIN ANTIBODIES
Beta-2 Glyco I IgG: <9 SGU (ref <=20)
Beta-2-Glycoprotein I IgA: <9 SAU (ref <=20)
Beta-2-Glycoprotein I IgM: <9 SMU (ref <=20)

## 2016-09-01 LAB — PROTEIN ELECTROPHORESIS, SERUM
Albumin ELP: 4.3 g/dL (ref 3.8–4.8)
Alpha-1-Globulin: 0.3 g/dL (ref 0.2–0.3)
Alpha-2-Globulin: 0.8 g/dL (ref 0.5–0.9)
Beta 2: 0.3 g/dL (ref 0.2–0.5)
Beta Globulin: 0.3 g/dL — ABNORMAL LOW (ref 0.4–0.6)
Gamma Globulin: 1.1 g/dL (ref 0.8–1.7)
Total Protein, Serum Electrophoresis: 7.2 g/dL (ref 6.1–8.1)

## 2016-09-01 LAB — RFLX HEXAGONAL PHASE CONFIRM: Hexagonal Phase Confirm: NEGATIVE

## 2016-09-01 LAB — RFX PTT-LA W/RFX TO HEX PHASE CONF: PTT-LA Screen: 43 s — ABNORMAL HIGH (ref <=40)

## 2016-09-01 LAB — RFX DRVVT SCR W/RFLX CONF 1:1 MIX: dRVVT Screen: 34 s (ref <=45)

## 2016-09-01 LAB — C3 AND C4
C3 Complement: 140 mg/dL (ref 83–193)
C4 Complement: 26 mg/dL (ref 15–57)

## 2016-09-01 LAB — LUPUS ANTICOAGULANT PANEL

## 2016-09-02 NOTE — Progress Notes (Signed)
+  ANA and Ds c/w SLE. Please sch next available appt to start on PLQ

## 2016-09-20 DIAGNOSIS — Z840 Family history of diseases of the skin and subcutaneous tissue: Secondary | ICD-10-CM | POA: Insufficient documentation

## 2016-09-20 NOTE — Progress Notes (Signed)
Visit Diagnoses: Polyarthralgia - ANA 1:320 nucleolar, RF negative, anti-CCP negative. Patient gives history of morning stiffness, pain and discomfort in her hands hips knees and feet. Difficulty making a fist in the morning. History of facial rash for which she uses topical steroids. History of nasal ulcers and oral ulcers. I would like to obtain some additional labs to evaluate this further.  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 - Plan: XR KNEE 3 VIEW RIGHT, XR KNEE 3 VIEW LEFT  Trochanteric bursitis of both hips: Stretching exercises were discussed.   Family history of lupus erythematosus -  second cousin  Family history of rheumatoid arthritis   Labs 08/30/16 ds DNA 8 rest negative

## 2016-09-21 ENCOUNTER — Ambulatory Visit (INDEPENDENT_AMBULATORY_CARE_PROVIDER_SITE_OTHER): Payer: BC Managed Care – PPO | Admitting: Rheumatology

## 2016-09-21 ENCOUNTER — Inpatient Hospital Stay (INDEPENDENT_AMBULATORY_CARE_PROVIDER_SITE_OTHER): Payer: Self-pay

## 2016-09-21 DIAGNOSIS — M2550 Pain in unspecified joint: Secondary | ICD-10-CM

## 2016-09-21 DIAGNOSIS — Z8261 Family history of arthritis: Secondary | ICD-10-CM

## 2016-09-21 DIAGNOSIS — M79642 Pain in left hand: Secondary | ICD-10-CM | POA: Diagnosis not present

## 2016-09-21 DIAGNOSIS — M79641 Pain in right hand: Secondary | ICD-10-CM | POA: Diagnosis not present

## 2016-09-21 DIAGNOSIS — Z840 Family history of diseases of the skin and subcutaneous tissue: Secondary | ICD-10-CM

## 2016-09-21 DIAGNOSIS — M791 Myalgia, unspecified site: Secondary | ICD-10-CM

## 2016-09-29 DIAGNOSIS — M359 Systemic involvement of connective tissue, unspecified: Secondary | ICD-10-CM | POA: Insufficient documentation

## 2016-09-29 DIAGNOSIS — M19042 Primary osteoarthritis, left hand: Secondary | ICD-10-CM | POA: Insufficient documentation

## 2016-09-29 DIAGNOSIS — M19041 Primary osteoarthritis, right hand: Secondary | ICD-10-CM | POA: Insufficient documentation

## 2016-09-29 NOTE — Progress Notes (Deleted)
Office Visit Note  Patient: Shari Lawrence             Date of Birth: 1961-01-25           MRN: [id number]             PCP: Crisoforo Oxford, PA-C Referring: Carlena Hurl, PA-C Visit Date: 10/04/2016 Occupation: _0 @    Subjective:  No chief complaint on file.   History of Present Illness: Shari Lawrence is a 56 y.o. female ***   Activities of Daily Living:  Patient reports morning stiffness for *** {minute/hour:19697}.   Patient {ACTIONS;DENIES/REPORTS:21021675::"Denies"} nocturnal pain.  Difficulty dressing/grooming: {ACTIONS;DENIES/REPORTS:21021675::"Denies"} Difficulty climbing stairs: {ACTIONS;DENIES/REPORTS:21021675::"Denies"} Difficulty getting out of chair: {ACTIONS;DENIES/REPORTS:21021675::"Denies"} Difficulty using hands for taps, buttons, cutlery, and/or writing: {ACTIONS;DENIES/REPORTS:21021675::"Denies"}   No Rheumatology ROS completed.   PMFS History:  Patient Active Problem List   Diagnosis Date Noted  . Autoimmune disease ([place]) 09/29/2016  . Primary osteoarthritis of both hands 09/29/2016  . Family history of lupus erythematosus 09/20/2016  . Polyarthralgia 05/06/2016  . Clenching of teeth 05/06/2016  . Finger swelling 05/06/2016  . Morning stiffness of joints 05/06/2016  . Myalgia 05/06/2016  . Family history of rheumatoid arthritis 05/06/2016  . Essential hypertension 12/23/2014  . Hyperlipidemia 12/23/2014  . History of anemia 12/23/2014  . Care provider for parents 12/23/2014  . Impaired fasting glucose 10/25/2011  . Gluten intolerance 10/25/2011  . GERD (gastroesophageal reflux disease) 10/25/2011  . Essential hypertension, benign 10/25/2011  . Anxiety state   . Iron deficiency anemia     Past Medical History:  Diagnosis Date  . Abnormal Pap smear 1988   treated with cryo  . Anemia 11/2010   hematology consult prior; etiology malabsorption and uterine bleeding  . Anxiety   . Gastric polyp   . GERD (gastroesophageal reflux  disease)   . H/O bone density study 12/2007  . H/O hysterectomy for benign disease 5/14  . History of echocardiogram 03/2010   normal LV function, EF 60-65%, mild left atrial enlargement  . HTN (hypertension) 03/2010   hospitalization for HTN urgency  . Hyperlipemia   . Internal hemorrhoids   . Lumbar degenerative disc disease   . Migraine   . Myalgia   . Spondylosis, cervical   . Umbilical hernia   . Uterine fibroid    hx/o     Family History  Problem Relation Age of Onset  . Hypertension Mother   . Dementia Mother   . Colon cancer Mother 59  . Deep vein thrombosis Father   . Hypertension Father   . Cancer Father     prostate CA  . Heart disease Father 22    CAD  . Hypertension Sister   . Hyperlipidemia Sister   . Hypertension Brother   . Hyperlipidemia Brother   . Hypertension Maternal Aunt   . Hyperlipidemia Maternal Aunt   . Rheum arthritis Maternal Aunt   . Diabetes Maternal Aunt     1 with type II, 1 with type 1  . Hypertension Brother   . Rheum arthritis Brother   . Diabetes Paternal Aunt     type II   Past Surgical History:  Procedure Laterality Date  . CERVICAL FUSION  2010  . CERVIX LESION DESTRUCTION  1988  . CESAREAN SECTION    . CHOLECYSTECTOMY  2003  . COLONOSCOPY  08/2010   Dr. Collene Mares  . ESOPHAGOGASTRODUODENOSCOPY  08/2010   Dr. Collene Mares  . EXPLORATORY LAPAROTOMY    . MYOMECTOMY  [place]    . ROBOTIC ASSISTED LAPAROSCOPIC HYSTERECTOMY AND SALPINGECTOMY  10/2012   [facility]; laproscopic due to fibroids  . 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  Has 49 yo daughter.  Her siblings and her don't agree on helping take care of  their parents, causes family tension.  As of 11/2014     Objective: Vital Signs: LMP 10/25/2012 (Approximate)    Physical Exam   Musculoskeletal Exam: ***  CDAI Exam: No CDAI exam completed.    Investigation: Findings:  ANA 1:320 nucleolar, RF negative, anti-CCP negative. Patient gives history of morning stiffness, pain and discomfort in her hands hips knees and feet. Difficulty making a fist in the morning. History of facial rash for which she uses topical steroids. History of nasal ulcers and oral ulcers.  08/30/2016 CBC normal, CMP normal, UA negative, ESR 12, DS DNA 8, C3-C4 normal, anticardiolipin negative, beta 2 negative, lupus anticoagulant negative, SPEP normal, G6PD normal  Office Visit on 08/30/2016  Component Date Value Ref Range Status  . WBC 08/30/2016 4.3  3.8 - 10.8 K/uL Final  . RBC 08/30/2016 4.19  3.80 - 5.10 MIL/uL Final  . Hemoglobin 08/30/2016 12.4  11.7 - 15.5 g/dL Final  . HCT 08/30/2016 38.4  35.0 - 45.0 % Final  . MCV 08/30/2016 91.6  80.0 - 100.0 fL Final  . MCH 08/30/2016 29.6  27.0 - 33.0 pg Final  . MCHC 08/30/2016 32.3  32.0 - 36.0 g/dL Final  . RDW 08/30/2016 14.3  11.0 - 15.0 % Final  . Platelets 08/30/2016 202  140 - 400 K/uL Final  . MPV 08/30/2016 11.5  7.5 - 12.5 fL Final  . Neutro Abs 08/30/2016 1935  1,500 - 7,800 cells/uL Final  . Lymphs Abs 08/30/2016 1935  850 - 3,900 cells/uL Final  . Monocytes Absolute 08/30/2016 258  200 - 950 cells/uL Final  . Eosinophils Absolute 08/30/2016 129  15 - 500 cells/uL Final  . Basophils Absolute 08/30/2016 43  0 - 200 cells/uL Final  . Neutrophils Relative % 08/30/2016 45  % Final  . Lymphocytes Relative 08/30/2016 45  % Final  . Monocytes Relative 08/30/2016 6  % Final  . Eosinophils Relative 08/30/2016 3  % Final  . Basophils Relative 08/30/2016 1  % Final  . Smear Review 08/30/2016 Criteria for review not met   Final  . Sodium 08/30/2016 141  135 - 146 mmol/L Final  . Potassium 08/30/2016 4.0   3.5 - 5.3 mmol/L Final  . Chloride 08/30/2016 103  98 - 110 mmol/L Final  . CO2 08/30/2016 23  20 - 31 mmol/L Final  . Glucose, Bld 08/30/2016 66  65 - 99 mg/dL Final  . BUN 08/30/2016 9  7 - 25 mg/dL Final  . Creat 08/30/2016 0.82  0.50 - 1.05 mg/dL Final   Comment:   For patients > or = 55 years of age: The upper reference limit for Creatinine is approximately 13% higher for people identified as African-American.     . Total Bilirubin 08/30/2016 0.3  0.2 - 1.2 mg/dL Final  . Alkaline Phosphatase 08/30/2016 52  33 - 130 U/L Final  . AST 08/30/2016 26  10 - 35 U/L Final  . ALT 08/30/2016 25  6 - 29 U/L Final  . Total Protein 08/30/2016 7.2  6.1 - 8.1 g/dL Final  . Albumin 08/30/2016 4.4  3.6 - 5.1 g/dL Final  . Calcium 08/30/2016 9.4  8.6 - 10.4 mg/dL Final  . GFR, Est African American 08/30/2016 >89  >=60 mL/min Final  . GFR, Est Non African American 08/30/2016 81  >=60 mL/min Final  . Color, Urine 08/30/2016 YELLOW  YELLOW Final  . APPearance 08/30/2016 CLEAR  CLEAR Final  . Specific Gravity, Urine 08/30/2016 1.019  1.001 - 1.035 Final  . pH 08/30/2016 7.0  5.0 - 8.0 Final  . Glucose, UA 08/30/2016 NEGATIVE  NEGATIVE Final  . Bilirubin Urine 08/30/2016 NEGATIVE  NEGATIVE Final  . Ketones, ur 08/30/2016 NEGATIVE  NEGATIVE Final  . Hgb urine dipstick 08/30/2016 NEGATIVE  NEGATIVE Final  . Protein, ur 08/30/2016 NEGATIVE  NEGATIVE Final  . Nitrite 08/30/2016 NEGATIVE  NEGATIVE Final  . Leukocytes, UA 08/30/2016 NEGATIVE  NEGATIVE Final  . Sed Rate 08/30/2016 12  0 - 30 mm/hr Final  . Total Protein, Serum Electrophores* 08/30/2016 7.2  6.1 - 8.1 g/dL Final  . Albumin ELP 08/30/2016 4.3  3.8 - 4.8 g/dL Final  . Alpha-1-Globulin 08/30/2016 0.3  0.2 - 0.3 g/dL Final  . Alpha-2-Globulin 08/30/2016 0.8  0.5 - 0.9 g/dL Final  . Beta Globulin 08/30/2016 0.3* 0.4 - 0.6 g/dL Final  . Beta 2 08/30/2016 0.3  0.2 - 0.5 g/dL Final  . Gamma Globulin 08/30/2016 1.1  0.8 - 1.7 g/dL Final  .  Abnormal Protein Band1 08/30/2016 NOT DET  g/dL Final  . SPE Interp. 08/30/2016 SEE NOTE   Final   Comment: One or more serum protein fractions are outside the normal ranges. No abnormal protein bands are apparent. Reviewed by Odis Hollingshead, MD, PhD, FCAP (Electronic Signature on File)   . Abnormal Protein Band2 08/30/2016 NOT DET  g/dL Final  . Abnormal Protein Band3 08/30/2016 NOT DET  g/dL Final  . Beta-2 Glyco I IgG 08/30/2016 <9  <=20 [facility] Final  . Beta-2-Glycoprotein I IgM 08/30/2016 <9  <=20 [facility] Final  . Beta-2-Glycoprotein I IgA 08/30/2016 <9  <=20 [facility] Final   Comment:   The Antiphospholipid Antibody Syndrome (APS) is a clinical-pathologic correlation that includes a clinical event (e.g. thrombosis, pregnancy loss, thrombocytopenia) and persistent positive Antiphospholipid Antibodies (IgM or IgG ACA >40 MPL/GPL, IgM or IgG anti-B2GPI antibodies, or a Lupus Anticoagulant). The IgA isotype has been implicated in smaller studies, but have not yet been incorporated into the APS criteria. International consensus guidelines suggest waiting at least 12 weeks before retesting to confirm antibody persistence. Reference J Thromb Haemost 2006: 4; 295   For more information on this test, go to http://education.[facility].com/faq/FAQ109   . Lupus Anticoagulant Eval 08/30/2016 REPORT   Final   Comment: A Lupus Anticoaguant is not detected. Common causes for a prolonged screen and negative confirmatory test include factor deficiencies or anticoagulant therapy. Reference Range:  Not Detected http://education.[facility].com/faq/LupusAnticoag ------------------------------------------------------- This interpretation is based on the following test results.   . Anticardiolipin IgA 08/30/2016 <11  APL Final   Comment:                                    Value      Interpretation                                 -------     --------------                               <  or =  11     Negative                                 12 - 20     Indeterminate                                 21 - 80     Low to Medium Positive                                    > 80     High Positive   . Anticardiolipin IgG 08/30/2016 14  GPL Final   Comment:                                    Value      Interpretation                                 -------     --------------                               < or = 14     Negative                                 15 - 20     Indeterminate                                 21 - 80     Low to Medium Positive                                    > 80     High Positive   . Anticardiolipin IgM 08/30/2016 <12  MPL Final   Comment:                                    Value      Interpretation                                 -------     --------------                               < or = 12     Negative                                 13 - 20     Indeterminate                                 21 - 80  Low to Medium Positive                                    > 80     High Positive The antiphospholipid antibody syndrome (APS) is a clinical-pathologic correlation that includes a clinical event (e.g. thrombosis, pregnancy loss, thrombocytopenia) and persistent positive antiphospholipid antibodies (IgM or IgG ACA >40 MPL/GPL, IgM or IgG anti-b2GPI antibodies or a lupus anticoagulant). The IgA isotype has been implicated in smaller studies, but have not yet been incorporated into the APS criteria. International consensus guidelines suggest waiting at least 12 weeks before retesting to confirm antibody persistence. Reference: J Thromb Haemost 2006: 4; 295   . C3 Complement 08/30/2016 140  83 - 193 mg/dL Final  . C4 Complement 08/30/2016 26  15 - 57 mg/dL Final  . ds DNA Ab 08/30/2016 8* IU/mL Final   Comment:                                 IU/mL       Interpretation                               < or = 4    Negative                                5-9         Indeterminate                               > or = 10   Positive     . Ribonucleic Protein(ENA) Antibody,* 08/30/2016 <1.0 NEG  <1.0 NEG AI Final  . Scleroderma (Scl-70) (ENA) Antibod* 08/30/2016 <1.0 NEG  <1.0 NEG AI Final  . SSA (Ro) (ENA) Antibody, IgG 08/30/2016 <1.0 NEG  <1.0 NEG AI Final  . SSB ([place]) (ENA) Antibody, IgG 08/30/2016 <1.0 NEG  <1.0 NEG AI Final  . ENA SM Ab Ser-aCnc 08/30/2016 <1.0 NEG  <1.0 NEG AI Final  . G-6PDH 08/30/2016 15.1  7.0 - 20.5 U/g Hgb Final  . dRVVT Mix Interp. 08/30/2016 REPORT   Final  . dRVVT Screen 08/30/2016 34  <=45 sec Final  . dRVVT 08/30/2016 REPORT   Final  . Hexagonal Phase Confirm 08/30/2016 Negative  Negative Final  . PTT-LA Screen 08/30/2016 43* <=40 sec Final  . Additional Testing 08/30/2016 REPORT   Final  Office Visit on 08/23/2016  Component Date Value Ref Range Status  . Influenza A, POC 08/25/2016 Negative  Negative Final  . Influenza B, POC 08/25/2016 Negative  Negative Final     Imaging: [place] Extrem Up Bilat Comp  Result Date: 09/21/2016 Ultrasound examination of bilateral hands was performed per EULAR recommendations. Using 12 MHz transducer, grayscale and power Doppler bilateral second, third, and fifth MCP joints and bilateral wrist joints both dorsal and volar aspects were evaluated to look for synovitis or tenosynovitis. The findings were there was no synovitis or tenosynovitis on ultrasound examination of the right hand and wrist joint. Synovitis was noted in the left second third and fifth MCP joint area and no synovitis was noted in the left wrist joint.. Right median nerve was 0.12 cm squares which  was upper limits of normal and left median nerve was 0.11 cm squares which was upper limits of normal. Impression: Ultrasound examination showed no synovitis in the right hand and wrist . Left hand exam showed mild synovitis in the right hand MCP joints only. Bilateral median nerves were upper limits of  normal.   Speciality Comments: No specialty comments available.    Procedures:  No procedures performed Allergies: Quinolones; Gluten meal; Latex; Penicillins; Percocet [oxycodone-acetaminophen]; Sulfa drugs cross reactors; and Vicodin [hydrocodone-acetaminophen]   Assessment / Plan:     Visit Diagnoses: Family history of lupus erythematosus  Autoimmune disease ([place]) - ANA 1:320 nucleolar, DS DNA 8 arthralgias, oral ulcers, nasal ulcer, history of facial rash  Myalgia  Primary osteoarthritis of both hands  Family history of rheumatoid arthritis  History of anemia  Anxiety state  Gastroesophageal reflux disease without esophagitis  Essential hypertension  Hyperlipidemia, unspecified hyperlipidemia type   She also has Essential hypertension,Hyperlipidemia, and Gastroesophageal reflux disease without esophagitis, and Anxiety on her problem list.   Orders: No orders of the defined types were placed in this encounter.  No orders of the defined types were placed in this encounter.   Face-to-face time spent with patient was *** minutes. 50% of time was spent in counseling and coordination of care.  Follow-Up Instructions: No Follow-up on file.   [name], MD  Note - This record has been created using Editor, commissioning.  Chart creation errors have been sought, but may not always  have been located. Such creation errors do not reflect on  the standard of medical care.

## 2016-10-03 NOTE — Progress Notes (Signed)
Office Visit Note  Patient: Shari Lawrence             Date of Birth: 02/16/1961           MRN: [id number]             PCP: Crisoforo Oxford, PA-C Referring: Carlena Hurl, PA-C Visit Date: 10/05/2016 Occupation: @GUAROCC @    Subjective:  Pain in multiple joints   History of Present Illness: Shari Lawrence is a 56 y.o. female with history of polyarthralgias. She returns for follow-up visit today she states she continues to have pain and discomfort in multiple joints. She denies any joint swelling. She has a stiffness in multiple joints lasting almost all day.   Activities of Daily Living:  Patient reports morning stiffness for 4 hours.   Patient Reports nocturnal pain.  Difficulty dressing/grooming: Denies Difficulty climbing stairs: Denies Difficulty getting out of chair: Denies Difficulty using hands for taps, buttons, cutlery, and/or writing: Denies   Review of Systems  Constitutional: Positive for fatigue. Negative for night sweats, weight gain, weight loss and weakness.  HENT: Positive for mouth dryness. Negative for mouth sores, trouble swallowing, trouble swallowing and nose dryness.        History of oral or nasal ulcers in the past  Eyes: Negative for pain, redness, visual disturbance and dryness.  Respiratory: Negative for cough, shortness of breath and difficulty breathing.   Cardiovascular: Negative for chest pain, palpitations, hypertension, irregular heartbeat and swelling in legs/feet.  Gastrointestinal: Positive for constipation. Negative for blood in stool and diarrhea.  Endocrine: Negative for increased urination.  Genitourinary: Negative for vaginal dryness.  Musculoskeletal: Positive for arthralgias, joint pain, myalgias, morning stiffness and myalgias. Negative for joint swelling, muscle weakness and muscle tenderness.  Skin: Negative for color change, rash, hair loss, skin tightness, ulcers and sensitivity to sunlight.  Allergic/Immunologic:  Negative for susceptible to infections.  Neurological: Negative for dizziness, memory loss and night sweats.  Hematological: Negative for swollen glands.  Psychiatric/Behavioral: Positive for sleep disturbance. Negative for depressed mood. The patient is nervous/anxious.     PMFS History:  Patient Active Problem List   Diagnosis Date Noted  . Primary osteoarthritis of both knees 10/05/2016  . Autoimmune disease ([place]) 09/29/2016  . Primary osteoarthritis of both hands 09/29/2016  . Family history of lupus erythematosus 09/20/2016  . Polyarthralgia 05/06/2016  . Clenching of teeth 05/06/2016  . Finger swelling 05/06/2016  . Morning stiffness of joints 05/06/2016  . Myalgia 05/06/2016  . Family history of rheumatoid arthritis 05/06/2016  . Essential hypertension 12/23/2014  . Hyperlipidemia 12/23/2014  . History of anemia 12/23/2014  . Care provider for parents 12/23/2014  . Impaired fasting glucose 10/25/2011  . Gluten intolerance 10/25/2011  . GERD (gastroesophageal reflux disease) 10/25/2011  . Essential hypertension, benign 10/25/2011  . Anxiety state   . Iron deficiency anemia     Past Medical History:  Diagnosis Date  . Abnormal Pap smear 1988   treated with cryo  . Anemia 11/2010   hematology consult prior; etiology malabsorption and uterine bleeding  . Anxiety   . Gastric polyp   . GERD (gastroesophageal reflux disease)   . H/O bone density study 12/2007  . H/O hysterectomy for benign disease 5/14  . History of echocardiogram 03/2010   normal LV function, EF 60-65%, mild left atrial enlargement  . HTN (hypertension) 03/2010   hospitalization for HTN urgency  . Hyperlipemia   . Internal hemorrhoids   . Lumbar degenerative  disc disease   . Migraine   . Myalgia   . Spondylosis, cervical   . Umbilical hernia   . Uterine fibroid    hx/o     Family History  Problem Relation Age of Onset  . Hypertension Mother   . Dementia Mother   . Colon cancer Mother 13  .  Deep vein thrombosis Father   . Hypertension Father   . Cancer Father     prostate CA  . Heart disease Father 37    CAD  . Hypertension Sister   . Hyperlipidemia Sister   . Hypertension Brother   . Hyperlipidemia Brother   . Hypertension Maternal Aunt   . Hyperlipidemia Maternal Aunt   . Rheum arthritis Maternal Aunt   . Diabetes Maternal Aunt     1 with type II, 1 with type 1  . Hypertension Brother   . Rheum arthritis Brother   . Diabetes Paternal Aunt     type II   Past Surgical History:  Procedure Laterality Date  . CERVICAL FUSION  2010  . CERVIX LESION DESTRUCTION  1988  . CESAREAN SECTION    . CHOLECYSTECTOMY  2003  . COLONOSCOPY  08/2010   Dr. Collene Mares  . ESOPHAGOGASTRODUODENOSCOPY  08/2010   Dr. Collene Mares  . EXPLORATORY LAPAROTOMY    . MYOMECTOMY  1998  . PELVIC LAPAROSCOPY    . ROBOTIC ASSISTED LAPAROSCOPIC HYSTERECTOMY AND SALPINGECTOMY  10/2012   [facility]; laproscopic due to fibroids  . 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  Has 10 yo daughter.  Her siblings and her don't agree on helping take care of their parents, causes family tension.  As of 11/2014     Objective: Vital Signs: BP 124/80   Pulse 69   Resp 12   Ht 5' 4"  (1.626 m)   Wt 128 lb (58.1 kg)   LMP 10/25/2012 (Approximate)   BMI 21.97 kg/m    Physical Exam  Constitutional: She is oriented to person, place, and time. She appears well-developed and well-nourished.  HENT:  Head: Normocephalic and atraumatic.  Eyes: Conjunctivae and EOM are normal.  Neck: Normal range of motion.  Cardiovascular: Normal rate, regular rhythm, normal heart sounds and intact distal pulses.   Pulmonary/Chest:  Effort normal and breath sounds normal.  Abdominal: Soft. Bowel sounds are normal.  Lymphadenopathy:    She has no cervical adenopathy.  Neurological: She is alert and oriented to person, place, and time.  Skin: Skin is warm and dry. Capillary refill takes less than 2 seconds.  Psychiatric: She has a normal mood and affect. Her behavior is normal.  Nursing note and vitals reviewed.    Musculoskeletal Exam: C-spine and thoracic lumbar spine good range of motion. Shoulder joints elbow joints wrist joint MCPs PIPs DIPs with good range of motion. She is some tenderness on palpation over bilateral MCP joints. No synovitis was noted. Hip joints knee joints ankles MTPs PIPs DIPs are good range of motion. She has tenderness on palpation over bilateral trochanteric area bilateral knee joints and also across her MTP joints. No synovitis was noted.  CDAI Exam: No CDAI exam completed.    Investigation: Findings:  November 2017 ANA 1:320  nucleolar 08/30/2016 CBC normal, CMP normal, UA negative, ESR 12, DS DNA 8 indeterminate, (RNP, SCL 70, SSA, SSB, Smith negative), C3-C4 normal, beta 2 normal, anticardiolipin normal, lupus anticoagulant normal, SPEP normal, G6PD normal, 09/13/2016 ultrasound bilateral hands showed only mild synovitis in left second third and fifth MCP joints    No visits with results within 1 Month(s) from this visit.  Latest known visit with results is:  Office Visit on 08/30/2016  Component Date Value Ref Range Status  . WBC 08/30/2016 4.3  3.8 - 10.8 K/uL Final  . RBC 08/30/2016 4.19  3.80 - 5.10 MIL/uL Final  . Hemoglobin 08/30/2016 12.4  11.7 - 15.5 g/dL Final  . HCT 08/30/2016 38.4  35.0 - 45.0 % Final  . MCV 08/30/2016 91.6  80.0 - 100.0 fL Final  . MCH 08/30/2016 29.6  27.0 - 33.0 pg Final  . MCHC 08/30/2016 32.3  32.0 - 36.0 g/dL Final  . RDW 08/30/2016 14.3  11.0 - 15.0 % Final  . Platelets 08/30/2016 202  140 - 400 K/uL Final  . MPV 08/30/2016 11.5  7.5 - 12.5 fL  Final  . Neutro Abs 08/30/2016 1935  1,500 - 7,800 cells/uL Final  . Lymphs Abs 08/30/2016 1935  850 - 3,900 cells/uL Final  . Monocytes Absolute 08/30/2016 258  200 - 950 cells/uL Final  . Eosinophils Absolute 08/30/2016 129  15 - 500 cells/uL Final  . Basophils Absolute 08/30/2016 43  0 - 200 cells/uL Final  . Neutrophils Relative % 08/30/2016 45  % Final  . Lymphocytes Relative 08/30/2016 45  % Final  . Monocytes Relative 08/30/2016 6  % Final  . Eosinophils Relative 08/30/2016 3  % Final  . Basophils Relative 08/30/2016 1  % Final  . Smear Review 08/30/2016 Criteria for review not met   Final  . Sodium 08/30/2016 141  135 - 146 mmol/L Final  . Potassium 08/30/2016 4.0  3.5 - 5.3 mmol/L Final  . Chloride 08/30/2016 103  98 - 110 mmol/L Final  . CO2 08/30/2016 23  20 - 31 mmol/L Final  . Glucose, Bld 08/30/2016 66  65 - 99 mg/dL Final  . BUN 08/30/2016 9  7 - 25 mg/dL Final  . Creat 08/30/2016 0.82  0.50 - 1.05 mg/dL Final   Comment:   For patients > or = 55 years of age: The upper reference limit for Creatinine is approximately 13% higher for people identified as African-American.     . Total Bilirubin 08/30/2016 0.3  0.2 - 1.2 mg/dL Final  . Alkaline Phosphatase 08/30/2016 52  33 - 130 U/L Final  . AST 08/30/2016 26  10 - 35 U/L Final  . ALT 08/30/2016 25  6 - 29 U/L Final  . Total Protein 08/30/2016 7.2  6.1 - 8.1 g/dL Final  . Albumin 08/30/2016 4.4  3.6 - 5.1 g/dL Final  . Calcium 08/30/2016 9.4  8.6 - 10.4 mg/dL Final  . GFR, Est African American 08/30/2016 >89  >=60 mL/min Final  . GFR, Est Non African American 08/30/2016 81  >=60 mL/min Final  . Color, Urine 08/30/2016 YELLOW  YELLOW Final  . APPearance 08/30/2016 CLEAR  CLEAR Final  . Specific Gravity, Urine 08/30/2016 1.019  1.001 - 1.035 Final  . pH 08/30/2016 7.0  5.0 - 8.0 Final  . Glucose, UA 08/30/2016 NEGATIVE  NEGATIVE Final  . Bilirubin Urine 08/30/2016 NEGATIVE  NEGATIVE Final  . Ketones, ur 08/30/2016  NEGATIVE  NEGATIVE Final  . Hgb urine dipstick  08/30/2016 NEGATIVE  NEGATIVE Final  . Protein, ur 08/30/2016 NEGATIVE  NEGATIVE Final  . Nitrite 08/30/2016 NEGATIVE  NEGATIVE Final  . Leukocytes, UA 08/30/2016 NEGATIVE  NEGATIVE Final  . Sed Rate 08/30/2016 12  0 - 30 mm/hr Final  . Total Protein, Serum Electrophores* 08/30/2016 7.2  6.1 - 8.1 g/dL Final  . Albumin ELP 08/30/2016 4.3  3.8 - 4.8 g/dL Final  . Alpha-1-Globulin 08/30/2016 0.3  0.2 - 0.3 g/dL Final  . Alpha-2-Globulin 08/30/2016 0.8  0.5 - 0.9 g/dL Final  . Beta Globulin 08/30/2016 0.3* 0.4 - 0.6 g/dL Final  . Beta 2 08/30/2016 0.3  0.2 - 0.5 g/dL Final  . Gamma Globulin 08/30/2016 1.1  0.8 - 1.7 g/dL Final  . Abnormal Protein Band1 08/30/2016 NOT DET  g/dL Final  . SPE Interp. 08/30/2016 SEE NOTE   Final   Comment: One or more serum protein fractions are outside the normal ranges. No abnormal protein bands are apparent. Reviewed by Odis Hollingshead, MD, PhD, FCAP (Electronic Signature on File)   . Abnormal Protein Band2 08/30/2016 NOT DET  g/dL Final  . Abnormal Protein Band3 08/30/2016 NOT DET  g/dL Final  . Beta-2 Glyco I IgG 08/30/2016 <9  <=20 [facility] Final  . Beta-2-Glycoprotein I IgM 08/30/2016 <9  <=20 [facility] Final  . Beta-2-Glycoprotein I IgA 08/30/2016 <9  <=20 [facility] Final   Comment:   The Antiphospholipid Antibody Syndrome (APS) is a clinical-pathologic correlation that includes a clinical event (e.g. thrombosis, pregnancy loss, thrombocytopenia) and persistent positive Antiphospholipid Antibodies (IgM or IgG ACA >40 MPL/GPL, IgM or IgG anti-B2GPI antibodies, or a Lupus Anticoagulant). The IgA isotype has been implicated in smaller studies, but have not yet been incorporated into the APS criteria. International consensus guidelines suggest waiting at least 12 weeks before retesting to confirm antibody persistence. Reference J Thromb Haemost 2006: 4; 295   For more information on this test, go  to http://education.[facility].com/faq/FAQ109   . Lupus Anticoagulant Eval 08/30/2016 REPORT   Final   Comment: A Lupus Anticoaguant is not detected. Common causes for a prolonged screen and negative confirmatory test include factor deficiencies or anticoagulant therapy. Reference Range:  Not Detected http://education.[facility].com/faq/LupusAnticoag ------------------------------------------------------- This interpretation is based on the following test results.   . Anticardiolipin IgA 08/30/2016 <11  APL Final   Comment:                                    Value      Interpretation                                 -------     --------------                               < or = 11     Negative                                 12 - 20     Indeterminate                                 21 - 80     Low to Medium Positive                                    >  80     High Positive   . Anticardiolipin IgG 08/30/2016 14  GPL Final   Comment:                                    Value      Interpretation                                 -------     --------------                               < or = 14     Negative                                 15 - 20     Indeterminate                                 21 - 80     Low to Medium Positive                                    > 80     High Positive   . Anticardiolipin IgM 08/30/2016 <12  MPL Final   Comment:                                    Value      Interpretation                                 -------     --------------                               < or = 12     Negative                                 13 - 20     Indeterminate                                 21 - 80     Low to Medium Positive                                    > 80     High Positive The antiphospholipid antibody syndrome (APS) is a clinical-pathologic correlation that includes a clinical event (e.g. thrombosis, pregnancy loss, thrombocytopenia) and  persistent positive antiphospholipid antibodies (IgM or IgG ACA >40 MPL/GPL, IgM or IgG anti-b2GPI antibodies or a lupus anticoagulant). The IgA isotype has been implicated in smaller studies, but have not yet been incorporated into the APS criteria. International consensus guidelines suggest waiting at least 12 weeks before retesting to confirm antibody persistence. Reference: Philip Aspen  Haemost 2006: 4; 295   . C3 Complement 08/30/2016 140  83 - 193 mg/dL Final  . C4 Complement 08/30/2016 26  15 - 57 mg/dL Final  . ds DNA Ab 08/30/2016 8* IU/mL Final   Comment:                                 IU/mL       Interpretation                               < or = 4    Negative                               5-9         Indeterminate                               > or = 10   Positive     . Ribonucleic Protein(ENA) Antibody,* 08/30/2016 <1.0 NEG  <1.0 NEG AI Final  . Scleroderma (Scl-70) (ENA) Antibod* 08/30/2016 <1.0 NEG  <1.0 NEG AI Final  . SSA (Ro) (ENA) Antibody, IgG 08/30/2016 <1.0 NEG  <1.0 NEG AI Final  . SSB ([place]) (ENA) Antibody, IgG 08/30/2016 <1.0 NEG  <1.0 NEG AI Final  . ENA SM Ab Ser-aCnc 08/30/2016 <1.0 NEG  <1.0 NEG AI Final  . G-6PDH 08/30/2016 15.1  7.0 - 20.5 U/g Hgb Final  . dRVVT Mix Interp. 08/30/2016 REPORT   Final  . dRVVT Screen 08/30/2016 34  <=45 sec Final  . dRVVT 08/30/2016 REPORT   Final  . Hexagonal Phase Confirm 08/30/2016 Negative  Negative Final  . PTT-LA Screen 08/30/2016 43* <=40 sec Final  . Additional Testing 08/30/2016 REPORT   Final     Imaging: [place] Extrem Up Bilat Comp  Result Date: 09/21/2016 Ultrasound examination of bilateral hands was performed per EULAR recommendations. Using 12 MHz transducer, grayscale and power Doppler bilateral second, third, and fifth MCP joints and bilateral wrist joints both dorsal and volar aspects were evaluated to look for synovitis or tenosynovitis. The findings were there was no synovitis or tenosynovitis on ultrasound  examination of the right hand and wrist joint. Synovitis was noted in the left second third and fifth MCP joint area and no synovitis was noted in the left wrist joint.. Right median nerve was 0.12 cm squares which was upper limits of normal and left median nerve was 0.11 cm squares which was upper limits of normal. Impression: Ultrasound examination showed no synovitis in the right hand and wrist . Left hand exam showed mild synovitis in the right hand MCP joints only. Bilateral median nerves were upper limits of normal.   Speciality Comments: No specialty comments available.    Procedures:  No procedures performed Allergies: Quinolones; Gluten meal; Latex; Penicillins; Percocet [oxycodone-acetaminophen]; Sulfa drugs cross reactors; and Vicodin [hydrocodone-acetaminophen]   Assessment / Plan:     Visit Diagnoses: Autoimmune disease ([place]) - ANA 1:320 NO, ds8, RF-,CCP-,History of nasal ulcers and oral ulcers in the past, positive synovitis in left hand MCPs. She continues to have a lot of pain and discomfort in her joints. We had detailed discussion regarding her labs her labs a borderline positive but she had mild synovitis on the ultrasound. After different treatment  options and their side effects were discussed with decided to give her a trial off Plaquenil and see if her symptoms would improve. After indications side effects contraindications were discussed and informed consent was taken. The plan is to start her on Plaquenil 200 mg 1 tablet by mouth twice a day Monday to Friday. We'll check labs in 1 month and then every 3 months which will include CBC and CMP. She will also need eye evaluation to monitor for Plaquenil ocular toxicity.  Primary osteoarthritis of both hands: She had mild changes on the x-ray  Primary osteoarthritis of both knees: She has some discomfort in her x-rays were consistent with osteoarthritis  Trochanteric bursitis of both hips: ITB and exercise were  discussed.  Family history of lupus erythematosus  Family history of rheumatoid arthritis  Myalgia: She has generalized pain and fatigue it is possible she may have a component of myofascial pain syndrome.  Her other medical problems are listed as follows:  Gluten intolerance  History of anemia   Essential hypertension  Hyperlipidemia, unspecified hyperlipidemia type  Gastroesophageal reflux disease without esophagitis  Anxiety state  Other iron deficiency anemia Essential hypertension  Hyperlipidemia, unspecified hyperlipidemia type  Gastroesophageal reflux disease without esophagitis   Other iron deficiency anemia   She also has Essential hypertension,Hyperlipidemia, unspecified hyperlipidemia type, Gastroesophageal reflux disease without esophagitis,Anxiety, and Other iron deficiency anemia on her problem list.   Orders: No orders of the defined types were placed in this encounter.  No orders of the defined types were placed in this encounter.   Face-to-face time spent with patient was 40 minutes. 50% of time was spent in counseling and coordination of care.  Follow-Up Instructions: No Follow-up on file.   [name], MD  Note - This record has been created using Editor, commissioning.  Chart creation errors have been sought, but may not always  have been located. Such creation errors do not reflect on  the standard of medical care.

## 2016-10-04 ENCOUNTER — Ambulatory Visit: Payer: BC Managed Care – PPO | Admitting: Rheumatology

## 2016-10-05 ENCOUNTER — Ambulatory Visit (INDEPENDENT_AMBULATORY_CARE_PROVIDER_SITE_OTHER): Payer: BC Managed Care – PPO | Admitting: Rheumatology

## 2016-10-05 ENCOUNTER — Encounter: Payer: Self-pay | Admitting: Rheumatology

## 2016-10-05 VITALS — BP 124/80 | HR 69 | Resp 12 | Ht 64.0 in | Wt 128.0 lb

## 2016-10-05 DIAGNOSIS — Z8261 Family history of arthritis: Secondary | ICD-10-CM

## 2016-10-05 DIAGNOSIS — M359 Systemic involvement of connective tissue, unspecified: Secondary | ICD-10-CM | POA: Diagnosis not present

## 2016-10-05 DIAGNOSIS — M791 Myalgia, unspecified site: Secondary | ICD-10-CM

## 2016-10-05 DIAGNOSIS — M19041 Primary osteoarthritis, right hand: Secondary | ICD-10-CM

## 2016-10-05 DIAGNOSIS — Z840 Family history of diseases of the skin and subcutaneous tissue: Secondary | ICD-10-CM

## 2016-10-05 DIAGNOSIS — M19042 Primary osteoarthritis, left hand: Secondary | ICD-10-CM

## 2016-10-05 DIAGNOSIS — M170 Bilateral primary osteoarthritis of knee: Secondary | ICD-10-CM | POA: Diagnosis not present

## 2016-10-05 DIAGNOSIS — M7061 Trochanteric bursitis, right hip: Secondary | ICD-10-CM | POA: Diagnosis not present

## 2016-10-05 DIAGNOSIS — Z862 Personal history of diseases of the blood and blood-forming organs and certain disorders involving the immune mechanism: Secondary | ICD-10-CM

## 2016-10-05 DIAGNOSIS — Z79899 Other long term (current) drug therapy: Secondary | ICD-10-CM | POA: Diagnosis not present

## 2016-10-05 DIAGNOSIS — K900 Celiac disease: Secondary | ICD-10-CM | POA: Diagnosis not present

## 2016-10-05 DIAGNOSIS — K9041 Non-celiac gluten sensitivity: Secondary | ICD-10-CM

## 2016-10-05 DIAGNOSIS — M7062 Trochanteric bursitis, left hip: Secondary | ICD-10-CM

## 2016-10-05 MED ORDER — HYDROXYCHLOROQUINE SULFATE 200 MG PO TABS: 200.0000 mg | ORAL_TABLET | Freq: Two times a day (BID) | ORAL | 2 refills | Status: DC

## 2016-10-05 NOTE — Patient Instructions (Addendum)
Standing Labs We placed an order today for your standing lab work.    Please come back and get your standing labs in 1 month then in 3 months.    We have open lab Monday through Friday from 8:30-11:30 AM and 1:30-4 PM at the office of Dr. Tresa Moore, PA.   The office is located at [address] [place], [place], [place] [address] No appointment is necessary.   Labs are drawn by [facility].  You may receive a bill from [facility] for your lab work.    Hydroxychloroquine tablets What is this medicine? HYDROXYCHLOROQUINE (hye drox ee KLOR oh kwin) is used to treat rheumatoid arthritis and systemic lupus erythematosus. It is also used to treat malaria. This medicine may be used for other purposes; ask your health care provider or pharmacist if you have questions. COMMON BRAND NAME(S): Plaquenil, Quineprox What should I tell my health care provider before I take this medicine? They need to know if you have any of these conditions: -diabetes -eye disease, vision problems -G6PD deficiency -history of blood diseases -history of irregular heartbeat -if you often drink alcohol -kidney disease -liver disease -porphyria -psoriasis -seizures -an unusual or allergic reaction to chloroquine, hydroxychloroquine, other medicines, foods, dyes, or preservatives -pregnant or trying to get pregnant -breast-feeding How should I use this medicine? Take this medicine by mouth with a glass of water. Follow the directions on the prescription label. Avoid taking antacids within 4 hours of taking this medicine. It is best to separate these medicines by at least 4 hours. Do not cut, crush or chew this medicine. You can take it with or without food. If it upsets your stomach, take it with food. Take your medicine at regular intervals. Do not take your medicine more often than directed. Take all of your medicine as directed even if you think you are better. Do not skip doses or stop your medicine  early. Talk to your pediatrician regarding the use of this medicine in children. While this drug may be prescribed for selected conditions, precautions do apply. Overdosage: If you think you have taken too much of this medicine contact a poison control center or emergency room at once. NOTE: This medicine is only for you. Do not share this medicine with others. What if I miss a dose? If you miss a dose, take it as soon as you can. If it is almost time for your next dose, take only that dose. Do not take double or extra doses. What may interact with this medicine? Do not take this medicine with any of the following medications: -cisapride -dofetilide -dronedarone -live virus vaccines -penicillamine -pimozide -thioridazine -ziprasidone This medicine may also interact with the following medications: -ampicillin -antacids -cimetidine -cyclosporine -digoxin -medicines for diabetes, like insulin, glipizide, glyburide -medicines for seizures like carbamazepine, phenobarbital, phenytoin -mefloquine -methotrexate -other medicines that prolong the QT interval (cause an abnormal heart rhythm) -praziquantel This list may not describe all possible interactions. Give your health care provider a list of all the medicines, herbs, non-prescription drugs, or dietary supplements you use. Also tell them if you smoke, drink alcohol, or use illegal drugs. Some items may interact with your medicine. What should I watch for while using this medicine? Tell your doctor or healthcare professional if your symptoms do not start to get better or if they get worse. Avoid taking antacids within 4 hours of taking this medicine. It is best to separate these medicines by at least 4 hours. Tell your doctor or  health care professional right away if you have any change in your eyesight. Your vision and blood may be tested before and during use of this medicine. This medicine can make you more sensitive to the sun. Keep  out of the sun. If you cannot avoid being in the sun, wear protective clothing and use sunscreen. Do not use sun lamps or tanning beds/booths. What side effects may I notice from receiving this medicine? Side effects that you should report to your doctor or health care professional as soon as possible: -allergic reactions like skin rash, itching or hives, swelling of the face, lips, or tongue -changes in vision -decreased hearing or ringing of the ears -redness, blistering, peeling or loosening of the skin, including inside the mouth -seizures -sensitivity to light -signs and symptoms of a dangerous change in heartbeat or heart rhythm like chest pain; dizziness; fast or irregular heartbeat; palpitations; feeling faint or lightheaded, falls; breathing problems -signs and symptoms of liver injury like dark yellow or brown urine; general ill feeling or flu-like symptoms; light-colored stools; loss of appetite; nausea; right upper belly pain; unusually weak or tired; yellowing of the eyes or skin -signs and symptoms of low blood sugar such as feeling anxious; confusion; dizziness; increased hunger; unusually weak or tired; sweating; shakiness; cold; irritable; headache; blurred vision; fast heartbeat; loss of consciousness -uncontrollable head, mouth, neck, arm, or leg movements Side effects that usually do not require medical attention (report to your doctor or health care professional if they continue or are bothersome): -anxious -diarrhea -dizziness -hair loss -headache -irritable -loss of appetite -nausea, vomiting -stomach pain This list may not describe all possible side effects. Call your doctor for medical advice about side effects. You may report side effects to FDA at 1-800-FDA-1088. Where should I keep my medicine? Keep out of the reach of children. In children, this medicine can cause overdose with small doses. Store at room temperature between 15 and 30 degrees C (59 and 86 degrees  F). Protect from moisture and light. Throw away any unused medicine after the expiration date. NOTE: This sheet is a summary. It may not cover all possible information. If you have questions about this medicine, talk to your doctor, pharmacist, or health care provider.  2018 Elsevier/Gold Standard (2016-01-27 14:16:15)     Iliotibial Bursitis Rehab Ask your health care provider which exercises are safe for you. Do exercises exactly as told by your health care provider and adjust them as directed. It is normal to feel mild stretching, pulling, tightness, or discomfort as you do these exercises, but you should stop right away if you feel sudden pain or your pain gets worse.Do not begin these exercises until told by your health care provider. Stretching and range of motion exercises These exercises warm up your muscles and joints and improve the movement and flexibility of your leg. These exercises also help to relieve pain and stiffness. Exercise A: Quadriceps stretch, prone   1. Lie on your abdomen on a firm surface, such as a bed or padded floor. 2. Bend your left / right knee and hold your ankle. If you cannot reach your ankle or pant leg, loop a belt around your foot and grab the belt instead. 3. Gently pull your heel toward your buttocks. Your knee should not slide out to the side. You should feel a stretch in the front of your thigh and knee. 4. Hold this position for __________ seconds. Repeat __________ times. Complete this exercise __________ times a day. Exercise  B: Lunge (  adductor stretch) 1. Stand and spread your legs about 3 feet (about 1 m) apart. Put your left / right leg slightly back for balance. 2. Lean away from your left / right leg by bending your other knee and shifting your weight toward your bent knee. You may rest your hands on your thigh for balance. You should feel a stretch in your left / right inner thigh. 3. Hold for __________ seconds. Repeat __________ times.  Complete this exercise __________ times a day. Exercise C: Hamstring stretch, supine  1. Lie on your back. 2. Hold both ends of a belt or towel as you loop it over the ball of your left / right foot. The ball of your foot is on the walking surface, right under your toes. 3. Straighten your left / right knee and slowly pull on the belt to raise your leg. Stop when you feel a gentle stretch in the back of your left / right knee or thigh.  Do not let your left / right knee bend.  Keep your other leg flat on the floor. 4. Hold this position for __________ seconds. Repeat __________ times. Complete this exercise __________ times a day. Strengthening exercises These exercises build strength and endurance in your leg. Endurance is the ability to use your muscles for a long time, even after they get tired. Exercise D: Quadriceps wall slides  1. Lean your back against a smooth wall or door while you walk your feet out 18-24 inches (46-61 cm) from it. 2. Place your feet hip-width apart. 3. Slowly slide down the wall or door until your knees bend as far as told by your health care provider. Keep your knees over your heels, not your toes. Keep your knees in line with your hips. 4. Hold for __________ seconds. 5. Push through your heels to stand up to rest for __________ seconds after each repetition. Repeat __________ times. Complete this exercise __________ times a day. Exercise E: Straight leg raises ( hip abductors) 1. Lie on your side, with your left / right leg in the top position. Lie so your head, shoulder, knee, and hip line up with each other. You may bend your bottom knee to help you balance. 2. Lift your top leg 4-6 inches (10-15 cm) while keeping your toes pointed straight ahead. 3. Hold this position for __________ seconds. 4. Slowly lower your leg to the starting position. Allow your muscles to relax completely after each repetition. Repeat __________ times. Complete this exercise  __________ times a day. Exercise F: Straight leg raises ( hip extensors) 1. Lie on your abdomen on a firm surface. You can put a pillow under your hips if that is more comfortable. 2. Tense the muscles in your buttocks and lift your left / right leg about 4-6 inches (10-15 cm). Keep your knee straight as you lift your leg. 3. Hold this position for __________ seconds. 4. Slowly lower your leg to the starting position. 5. Let your leg relax completely after each repetition. Repeat __________ times. Complete this exercise __________ times a day. Exercise G: Bridge ( hip extensors) 1. Lie on your back on a firm surface with your knees bent and your feet flat on the floor. 2. Tighten your buttocks muscles and lift your bottom off the floor until your trunk is level with your thighs.  Do not arch your back.  You should feel the muscles working in your buttocks and the back of your thighs. If you do  not feel these muscles, slide your feet 1-2 inches (2.5-5 cm) farther away from your buttocks. 3. Hold this position for __________ seconds. 4. Slowly lower your hips to the starting position. 5. Let your buttocks muscles relax completely between repetitions. 6. If this exercise is too easy, try doing it with your arms crossed over your chest. Repeat __________ times. Complete this exercise __________ times a day. This information is not intended to replace advice given to you by your health care provider. Make sure you discuss any questions you have with your health care provider. Document Released: 06/13/2005 Document Revised: 02/18/2016 Document Reviewed: 05/26/2015 Elsevier Interactive Patient Education  2017 [employer].

## 2016-10-05 NOTE — Progress Notes (Signed)
Pharmacy Note  Subjective: Patient presents today to the [facility] to see Dr. Estanislado Pandy.  Patient seen by the pharmacist for counseling on hydroxychloroquine.    Objective: CMP Latest Ref Rng & Units 08/30/2016 03/09/2016 12/23/2014  Glucose 65 - 99 mg/dL 66 89 83  BUN 7 - 25 mg/dL 9 11 11   Creatinine 0.50 - 1.05 mg/dL 0.82 0.85 0.79  Sodium 135 - 146 mmol/L 141 139 140  Potassium 3.5 - 5.3 mmol/L 4.0 3.9 4.3  Chloride 98 - 110 mmol/L 103 105 103  CO2 20 - 31 mmol/L 23 25 23   Calcium 8.6 - 10.4 mg/dL 9.4 9.9 9.4  Total Protein 6.1 - 8.1 g/dL 7.2 6.5 6.8  Total Bilirubin 0.2 - 1.2 mg/dL 0.3 0.7 0.5  Alkaline Phos 33 - 130 U/L 52 54 55  AST 10 - 35 U/L 26 22 24   ALT 6 - 29 U/L 25 24 24    CBC    Component Value Date/Time   WBC 4.3 08/30/2016 1102   RBC 4.19 08/30/2016 1102   HGB 12.4 08/30/2016 1102   HGB 11.1 (L) 07/07/2011 1527   HCT 38.4 08/30/2016 1102   HCT 34.1 (L) 07/07/2011 1527   PLT 202 08/30/2016 1102   PLT 204 07/07/2011 1527   MCV 91.6 08/30/2016 1102   MCV 83.8 07/07/2011 1527   MCH 29.6 08/30/2016 1102   MCHC 32.3 08/30/2016 1102   RDW 14.3 08/30/2016 1102   RDW 22.2 (H) 07/07/2011 1527   LYMPHSABS 1,935 08/30/2016 1102   LYMPHSABS 1.6 07/07/2011 1527   MONOABS 258 08/30/2016 1102   MONOABS 0.3 07/07/2011 1527   EOSABS 129 08/30/2016 1102   EOSABS 0.4 07/07/2011 1527   BASOSABS 43 08/30/2016 1102   BASOSABS 0.1 07/07/2011 1527    Assessment/Plan: Patient was prescribed hydroxychloroquine 200 mg BID Monday through Friday.  Patient was counseled on the purpose, proper use, and adverse effects of hydroxychloroquine including nausea/diarrhea, skin rash, headaches, and sun sensitivity.  Discussed importance of annual eye exams while on hydroxychloroquine to monitor to ocular toxicity and discussed importance of frequent laboratory monitoring.  Provided patient with eye exam form for baseline ophthalmologic exam and standing lab instructions.   Provided patient with educational materials on hydroxychloroquine and answered all questions.  Patient consented to hydroxychloroquine.  Will upload consent in the media tab.    Elisabeth Most, Pharm.D., BCPS Clinical Pharmacist Pager: [phone] Phone: [phone] 10/05/2016 3:23 PM

## 2016-11-14 ENCOUNTER — Telehealth: Payer: Self-pay | Admitting: Nurse Practitioner

## 2016-11-14 NOTE — Telephone Encounter (Signed)
Yes. agree

## 2016-11-14 NOTE — Telephone Encounter (Signed)
Patient wants to speak with the nurse. She is not sure if she needs to go to GI or GYN. She has not been able to have a bowel movement in a while.

## 2016-11-14 NOTE — Telephone Encounter (Signed)
Spoke with patient. Patient reports abdomen is distended and unable to have a full bowel movement. Patient reports she has history of a hernia. Reports using enema with little relief. Reports small stool pellets last night. Patient denies any GYN complaints. Patient states she has an established pcp, recommended f/u with pcp for evaluation. Patient will call pcp for appointment. Advised patient will review with Kem Boroughs, NP and return call with any additional recommendations, patient is agreeable.  Kem Boroughs, NP -do you agree with recommendations?

## 2016-11-17 ENCOUNTER — Ambulatory Visit (INDEPENDENT_AMBULATORY_CARE_PROVIDER_SITE_OTHER): Payer: BC Managed Care – PPO | Admitting: Medical

## 2016-11-17 ENCOUNTER — Other Ambulatory Visit: Payer: Self-pay | Admitting: Medical

## 2016-11-17 ENCOUNTER — Encounter: Payer: Self-pay | Admitting: Medical

## 2016-11-17 ENCOUNTER — Telehealth: Payer: Self-pay | Admitting: Medical

## 2016-11-17 VITALS — BP 120/80 | HR 47 | Temp 97.9°F | Wt 126.8 lb

## 2016-11-17 DIAGNOSIS — F458 Other somatoform disorders: Secondary | ICD-10-CM | POA: Diagnosis not present

## 2016-11-17 DIAGNOSIS — K5909 Other constipation: Secondary | ICD-10-CM

## 2016-11-17 DIAGNOSIS — K219 Gastro-esophageal reflux disease without esophagitis: Secondary | ICD-10-CM

## 2016-11-17 DIAGNOSIS — M359 Systemic involvement of connective tissue, unspecified: Secondary | ICD-10-CM

## 2016-11-17 DIAGNOSIS — D8989 Other specified disorders involving the immune mechanism, not elsewhere classified: Secondary | ICD-10-CM

## 2016-11-17 DIAGNOSIS — R0989 Other specified symptoms and signs involving the circulatory and respiratory systems: Secondary | ICD-10-CM

## 2016-11-17 DIAGNOSIS — R198 Other specified symptoms and signs involving the digestive system and abdomen: Secondary | ICD-10-CM

## 2016-11-17 MED ORDER — LINACLOTIDE 72 MCG PO CAPS: 72.0000 ug | ORAL_CAPSULE | Freq: Every day | ORAL | 0 refills | Status: DC

## 2016-11-17 NOTE — Telephone Encounter (Signed)
Please try and get EGD and colonoscopy reports from Dr. Collene Mares

## 2016-11-17 NOTE — Progress Notes (Signed)
Subjective: Chief Complaint  Patient presents with  . hard time swallowing    feels like something is stuck in throat   Here for feeling of something stuck in throat when swallowing for the past month.  She does have hx/o GERD, is taking Nexium daily, avoids spicy foods, but lately feels like food gets hung up in throat.  Also gets bloated, gassy.   Has hx/o constipation.  A few months ago was having daily BM, but then had change in BMs where she went 3 weeks without a BM.   Tried miralax, enemas, and ended up calling Dr. Collene Mares her gastroenterology who gave her some recommendations.  She did that and had 3 days of loose stool.   Currently the BMs are a little better.  Had normal BM this morning.  No nausea, no vomiting, no blood in stool, no fever, no weight loss.   Had EGD and colonoscopy 2017 with Dr. Collene Mares, reportedly normal findings.   No other aggravating or relieving factors. No other complaint.  Past Medical History:  Diagnosis Date  . Abnormal Pap smear 1988   treated with cryo  . Anemia 11/2010   hematology consult prior; etiology malabsorption and uterine bleeding  . Anxiety   . Gastric polyp   . GERD (gastroesophageal reflux disease)   . H/O bone density study 12/2007  . H/O hysterectomy for benign disease 5/14  . History of echocardiogram 03/2010   normal LV function, EF 60-65%, mild left atrial enlargement  . HTN (hypertension) 03/2010   hospitalization for HTN urgency  . Hyperlipemia   . Internal hemorrhoids   . Lumbar degenerative disc disease   . Migraine   . Myalgia   . Spondylosis, cervical   . Umbilical hernia   . Uterine fibroid    hx/o    Past Surgical History:  Procedure Laterality Date  . CERVICAL FUSION  2010  . CERVIX LESION DESTRUCTION  1988  . CESAREAN SECTION    . CHOLECYSTECTOMY  2003  . COLONOSCOPY  08/2010   Dr. Collene Mares  . ESOPHAGOGASTRODUODENOSCOPY  08/2010   Dr. Collene Mares  . EXPLORATORY LAPAROTOMY    . MYOMECTOMY  1998  . PELVIC LAPAROSCOPY    .  ROBOTIC ASSISTED LAPAROSCOPIC HYSTERECTOMY AND SALPINGECTOMY  10/2012   [facility]; laproscopic due to fibroids  . UMBILICAL HERNIA REPAIR  10/2013   infected laparoscopic port, mesh placement   ROS as in subjective   Objective: BP 120/80   Pulse (!) 47   Temp 97.9 F (36.6 C) (Oral)   Wt 126 lb 12.8 oz (57.5 kg)   LMP 10/25/2012 (Approximate)   SpO2 98%   BMI 21.77 kg/m   General appearance: alert, no distress, WD/WN,  HEENT: normocephalic, sclerae anicteric, TMs pearly, nares patent, no discharge or erythema, pharynx normal Oral cavity: MMM, no lesions Neck: supple, no lymphadenopathy, no thyromegaly, no masses Heart: RRR, normal S1, S2, no murmurs Lungs: CTA bilaterally, no wheezes, rhonchi, or rales Abdomen: +bs, soft, mild RLQ and epigastric tenderness, otherwise non tender, non distended, no masses, no hepatomegaly, no splenomegaly Pulses: 2+ symmetric, upper and lower extremities, normal cap refill Ext: no edema   Assessment: Encounter Diagnoses  Name Primary?  . Globus sensation Yes  . Gastroesophageal reflux disease without esophagitis   . Chronic constipation   . Autoimmune disease ([place])      Plan: Globus sensation - discussed possible causes.   Of note, she sees Dr. Collene Mares Fabienne Bruns and had EGD/colonoscopy 2017 although I don't have  copy of results.   increase Nexium to BID for a week, avoid GERD triggers, then call or recheck  GERD - c/t Nexium, avoid triggers  chronic constipation - discussed prior remedies.   discussed water and fiber intake, prevention, begin samples of Linzess.     F/u 7-10 days

## 2016-12-03 ENCOUNTER — Other Ambulatory Visit: Payer: Self-pay | Admitting: Medical

## 2016-12-05 ENCOUNTER — Telehealth: Payer: Self-pay | Admitting: Medical

## 2016-12-05 ENCOUNTER — Other Ambulatory Visit: Payer: Self-pay

## 2016-12-05 NOTE — Telephone Encounter (Signed)
Pt called and states that she does not feel any change in her throat, still feels like something is in her throat, she did increase her antiacids, and changed her food habit, would like to know what she needs to do now,pt can be reached at [phone] pt uses [id number] - [place], [place] [address]

## 2016-12-05 NOTE — Telephone Encounter (Signed)
Pt would like to go to ENT first and go from there. Please refer to ENT

## 2016-12-05 NOTE — Telephone Encounter (Signed)
Sent referral to [facility]

## 2016-12-05 NOTE — Telephone Encounter (Signed)
Per shane see if she want to referred to ent  Or have a barium swallow . Forwarding Gabriel Cirri

## 2016-12-05 NOTE — Telephone Encounter (Signed)
Set up for swallow study through hospital or imaging.  Get copy of last 2017 EGD/colonoscopy from Dr. Collene Mares, GI office note.

## 2016-12-14 ENCOUNTER — Other Ambulatory Visit: Payer: Self-pay

## 2016-12-14 DIAGNOSIS — R1310 Dysphagia, unspecified: Secondary | ICD-10-CM

## 2016-12-29 NOTE — Progress Notes (Signed)
Office Visit Note  Patient: Shari Lawrence             Date of Birth: 30-May-1961           MRN: [id number]             PCP: Carlena Hurl, PA-C Referring: Carlena Hurl, PA-C Visit Date: 01/04/2017 Occupation: @GUAROCC @    Subjective:  Medication Management   History of Present Illness: Shari Lawrence is a 56 y.o. female with history of autoimmune disease. She states she started Plaquenil about a month ago. She has not noticed much change in her symptoms so far. She states she's been under a lot of stress lately and feeling like she might be developing oral ulcers although they have not been appeared yet. She still have difficulty climbing steps. Her generalized pain is somewhat better. She continues to have some lower back and hip pain.  Activities of Daily Living:  Patient reports morning stiffness for 1 hour.   Patient Reports nocturnal pain.  Difficulty dressing/grooming: Denies Difficulty climbing stairs: Denies Difficulty getting out of chair: Reports Difficulty using hands for taps, buttons, cutlery, and/or writing: Denies   Review of Systems  Constitutional: Positive for fatigue. Negative for night sweats, weight gain, weight loss and weakness.  HENT: Positive for mouth sores. Negative for trouble swallowing, trouble swallowing, mouth dryness and nose dryness.   Eyes: Negative for pain, redness, visual disturbance and dryness.  Respiratory: Negative for cough, shortness of breath and difficulty breathing.   Cardiovascular: Negative for chest pain, palpitations, hypertension, irregular heartbeat and swelling in legs/feet.  Gastrointestinal: Positive for constipation. Negative for blood in stool and diarrhea.  Endocrine: Negative for increased urination.  Genitourinary: Negative for vaginal dryness.  Musculoskeletal: Positive for arthralgias, joint pain, myalgias, morning stiffness and myalgias. Negative for joint swelling, muscle weakness and muscle tenderness.    Skin: Negative for color change, rash, hair loss, skin tightness, ulcers and sensitivity to sunlight.  Allergic/Immunologic: Negative for susceptible to infections.  Neurological: Negative for dizziness, memory loss and night sweats.  Hematological: Negative for swollen glands.  Psychiatric/Behavioral: Positive for depressed mood and sleep disturbance. The patient is nervous/anxious.     PMFS History:  Patient Active Problem List   Diagnosis Date Noted  . Chronic constipation 11/17/2016  . Globus sensation 11/17/2016  . Primary osteoarthritis of both knees 10/05/2016  . Autoimmune disease ([place]) 09/29/2016  . Primary osteoarthritis of both hands 09/29/2016  . Family history of lupus erythematosus 09/20/2016  . Polyarthralgia 05/06/2016  . Clenching of teeth 05/06/2016  . Finger swelling 05/06/2016  . Morning stiffness of joints 05/06/2016  . Myalgia 05/06/2016  . Family history of rheumatoid arthritis 05/06/2016  . Essential hypertension 12/23/2014  . Hyperlipidemia 12/23/2014  . History of anemia 12/23/2014  . Care provider for parents 12/23/2014  . Impaired fasting glucose 10/25/2011  . Gluten intolerance 10/25/2011  . Gastroesophageal reflux disease without esophagitis 10/25/2011  . Essential hypertension, benign 10/25/2011  . Anxiety state   . Iron deficiency anemia     Past Medical History:  Diagnosis Date  . Abnormal Pap smear 1988   treated with cryo  . Anemia 11/2010   hematology consult prior; etiology malabsorption and uterine bleeding  . Anxiety   . Gastric polyp   . GERD (gastroesophageal reflux disease)   . H/O bone density study 12/2007  . H/O hysterectomy for benign disease 5/14  . History of echocardiogram 03/2010   normal LV function, EF 60-65%,  mild left atrial enlargement  . HTN (hypertension) 03/2010   hospitalization for HTN urgency  . Hyperlipemia   . Internal hemorrhoids   . Lumbar degenerative disc disease   . Migraine   . Myalgia   .  Spondylosis, cervical   . Umbilical hernia   . Uterine fibroid    hx/o     Family History  Problem Relation Age of Onset  . Hypertension Mother   . Dementia Mother   . Colon cancer Mother 37  . Deep vein thrombosis Father   . Hypertension Father   . Cancer Father        prostate CA  . Heart disease Father 98       CAD  . Hypertension Sister   . Hyperlipidemia Sister   . Hypertension Brother   . Hyperlipidemia Brother   . Hypertension Maternal Aunt   . Hyperlipidemia Maternal Aunt   . Rheum arthritis Maternal Aunt   . Diabetes Maternal Aunt        1 with type II, 1 with type 1  . Hypertension Brother   . Rheum arthritis Brother   . Diabetes Paternal Aunt        type II   Past Surgical History:  Procedure Laterality Date  . CERVICAL FUSION  2010  . CERVIX LESION DESTRUCTION  1988  . CESAREAN SECTION    . CHOLECYSTECTOMY  2003  . COLONOSCOPY  08/2010   Dr. Collene Mares  . ESOPHAGOGASTRODUODENOSCOPY  08/2010   Dr. Collene Mares  . EXPLORATORY LAPAROTOMY    . MYOMECTOMY  1998  . PELVIC LAPAROSCOPY    . ROBOTIC ASSISTED LAPAROSCOPIC HYSTERECTOMY AND SALPINGECTOMY  10/2012   [facility]; laproscopic due to fibroids  . 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  Has 48 yo daughter.  Her siblings and her don't agree on helping take care of their parents, causes family tension.  As of 11/2014     Objective: Vital Signs: BP 106/60   Pulse 62   Resp 14   Ht 5\' 4"  (1.626 m)   Wt 130 lb (59 kg)   LMP 10/25/2012 (Approximate)   BMI 22.31 kg/m    Physical Exam  Constitutional: She is oriented to person, place, and time. She appears well-developed and well-nourished.  HENT:  Head:  Normocephalic and atraumatic.  Eyes: Conjunctivae and EOM are normal.  Neck: Normal range of motion.  Cardiovascular: Normal rate, regular rhythm, normal heart sounds and intact distal pulses.   Pulmonary/Chest: Effort normal and breath sounds normal.  Abdominal: Soft. Bowel sounds are normal.  Lymphadenopathy:    She has no cervical adenopathy.  Neurological: She is alert and oriented to person, place, and time.  Skin: Skin is warm and dry. Capillary refill takes less than 2 seconds.  Psychiatric: She has a normal mood and affect. Her behavior is normal.  Nursing note and vitals reviewed.    Musculoskeletal Exam: C-spine and thoracic spine and lumbar spine good range of motion. Shoulder joints elbow joints wrist joint MCPs PIPs DIPs with good range of motion with no synovitis. Hip joints knee joints ankles MTPs PIPs DIPs with good range of motion with no synovitis.  CDAI Exam: CDAI Homunculus  Exam:   Joint Counts:  CDAI Tender Joint count: 0 CDAI Swollen Joint count: 0  Global Assessments:  Patient Global Assessment: 5 Provider Global Assessment: 1  CDAI Calculated Score: 6    Investigation: No additional findings. CBC Latest Ref Rng & Units 08/30/2016 03/09/2016 12/23/2014  WBC 3.8 - 10.8 K/uL 4.3 5.9 3.9(L)  Hemoglobin 11.7 - 15.5 g/dL 12.4 12.0 12.5  Hematocrit 35.0 - 45.0 % 38.4 37.3 38.7  Platelets 140 - 400 K/uL 202 169 189   CMP Latest Ref Rng & Units 08/30/2016 03/09/2016 12/23/2014  Glucose 65 - 99 mg/dL 66 89 83  BUN 7 - 25 mg/dL 9 11 11   Creatinine 0.50 - 1.05 mg/dL 0.82 0.85 0.79  Sodium 135 - 146 mmol/L 141 139 140  Potassium 3.5 - 5.3 mmol/L 4.0 3.9 4.3  Chloride 98 - 110 mmol/L 103 105 103  CO2 20 - 31 mmol/L 23 25 23   Calcium 8.6 - 10.4 mg/dL 9.4 9.9 9.4  Total Protein 6.1 - 8.1 g/dL 7.2 6.5 6.8  Total Bilirubin 0.2 - 1.2 mg/dL 0.3 0.7 0.5  Alkaline Phos 33 - 130 U/L 52 54 55  AST 10 - 35 U/L 26 22 24   ALT 6 - 29 U/L 25 24 24     Imaging: No results  found.  Speciality Comments: No specialty comments available.    Procedures:  No procedures performed Allergies: Quinolones; Latex; Penicillins; Percocet [oxycodone-acetaminophen]; Sulfa drugs cross reactors; Vicodin [hydrocodone-acetaminophen]; and Gluten meal   Assessment / Plan:     Visit Diagnoses: Autoimmune disease ([place])  - ANA 1:320 NO, ds8, RF-,CCP-,History of nasal ulcers and oral ulcers in the past, positive synovitis in left hand MCPs on [place]. Patient started taking Plaquenil a month ago. She has not noticed much improvement in her symptoms so far. She believes her hand pain is doing somewhat better.  High risk medication use - Rxd Plaquenil 10/05/2016, started in June 2018. Her eye exam in June 2018 was normal. - Plan: CBC with Differential/Platelet, COMPLETE METABOLIC PANEL WITH GFR  Primary osteoarthritis of both hands: Mild  Primary osteoarthritis of both knees: Some discomfort :: Gluten intolerance: She's been watching her diet  Other medical problems are listed as follows: History of anemia  History of hyperlipidemia  History of gastroesophageal reflux (GERD)  Myofascial pain: Chronic pain  Anxiety state  Care provider for parents  Family history of lupus erythematosus  Family history of rheumatoid arthritis  High risk medication use - Plan: CBC with Differential/Platelet, COMPLETE METABOLIC PANEL WITH GFR    Orders: No orders of the defined types were placed in this encounter.  No orders of the defined types were placed in this encounter.   Face-to-face time spent with patient was 30 minutes. 50% of time was spent in counseling and coordination of care.  Follow-Up Instructions: Return in about 3 months (around 04/06/2017) for Autoimmune disease.   [name], MD  Note - This record has been created using Editor, commissioning.  Chart creation errors have been sought, but may not always  have been located. Such creation errors do not reflect on    the standard of medical care.

## 2017-01-04 ENCOUNTER — Encounter (INDEPENDENT_AMBULATORY_CARE_PROVIDER_SITE_OTHER): Payer: Self-pay

## 2017-01-04 ENCOUNTER — Encounter: Payer: Self-pay | Admitting: Rheumatology

## 2017-01-04 ENCOUNTER — Ambulatory Visit (INDEPENDENT_AMBULATORY_CARE_PROVIDER_SITE_OTHER): Payer: BC Managed Care – PPO | Admitting: Rheumatology

## 2017-01-04 VITALS — BP 106/60 | HR 62 | Resp 14 | Ht 64.0 in | Wt 130.0 lb

## 2017-01-04 DIAGNOSIS — K9041 Non-celiac gluten sensitivity: Secondary | ICD-10-CM | POA: Diagnosis not present

## 2017-01-04 DIAGNOSIS — Z8719 Personal history of other diseases of the digestive system: Secondary | ICD-10-CM

## 2017-01-04 DIAGNOSIS — M791 Myalgia: Secondary | ICD-10-CM | POA: Diagnosis not present

## 2017-01-04 DIAGNOSIS — M19041 Primary osteoarthritis, right hand: Secondary | ICD-10-CM

## 2017-01-04 DIAGNOSIS — Z636 Dependent relative needing care at home: Secondary | ICD-10-CM

## 2017-01-04 DIAGNOSIS — M7918 Myalgia, other site: Secondary | ICD-10-CM

## 2017-01-04 DIAGNOSIS — Z8639 Personal history of other endocrine, nutritional and metabolic disease: Secondary | ICD-10-CM | POA: Diagnosis not present

## 2017-01-04 DIAGNOSIS — Z840 Family history of diseases of the skin and subcutaneous tissue: Secondary | ICD-10-CM | POA: Diagnosis not present

## 2017-01-04 DIAGNOSIS — Z8261 Family history of arthritis: Secondary | ICD-10-CM

## 2017-01-04 DIAGNOSIS — Z862 Personal history of diseases of the blood and blood-forming organs and certain disorders involving the immune mechanism: Secondary | ICD-10-CM | POA: Diagnosis not present

## 2017-01-04 DIAGNOSIS — F411 Generalized anxiety disorder: Secondary | ICD-10-CM | POA: Diagnosis not present

## 2017-01-04 DIAGNOSIS — M359 Systemic involvement of connective tissue, unspecified: Secondary | ICD-10-CM

## 2017-01-04 DIAGNOSIS — Z79899 Other long term (current) drug therapy: Secondary | ICD-10-CM

## 2017-01-04 DIAGNOSIS — D8989 Other specified disorders involving the immune mechanism, not elsewhere classified: Secondary | ICD-10-CM | POA: Diagnosis not present

## 2017-01-04 DIAGNOSIS — M19042 Primary osteoarthritis, left hand: Secondary | ICD-10-CM

## 2017-01-04 DIAGNOSIS — M170 Bilateral primary osteoarthritis of knee: Secondary | ICD-10-CM | POA: Diagnosis not present

## 2017-01-04 LAB — CBC WITH DIFFERENTIAL/PLATELET
Basophils Absolute: 41 cells/uL (ref 0–200)
Basophils Relative: 1 %
Eosinophils Absolute: 164 cells/uL (ref 15–500)
Eosinophils Relative: 4 %
HCT: 38 % (ref 35.0–45.0)
Hemoglobin: 12.2 g/dL (ref 11.7–15.5)
Lymphocytes Relative: 39 %
Lymphs Abs: 1599 cells/uL (ref 850–3900)
MCH: 30.1 pg (ref 27.0–33.0)
MCHC: 32.1 g/dL (ref 32.0–36.0)
MCV: 93.8 fL (ref 80.0–100.0)
MPV: 12.1 fL (ref 7.5–12.5)
Monocytes Absolute: 246 cells/uL (ref 200–950)
Monocytes Relative: 6 %
Neutro Abs: 2050 cells/uL (ref 1500–7800)
Neutrophils Relative %: 50 %
Platelets: 175 10*3/uL (ref 140–400)
RBC: 4.05 MIL/uL (ref 3.80–5.10)
RDW: 14.4 % (ref 11.0–15.0)
WBC: 4.1 10*3/uL (ref 3.8–10.8)

## 2017-01-04 NOTE — Patient Instructions (Signed)
Natural anti-inflammatories  You can purchase these at [employer], [employer] or online.  . Turmeric (capsules)  . Ginger (ginger root or capsules)  . Omega 3 (Fish, flax seeds, chia seeds, walnuts, almonds)  . Tart cherry (dried or extract)   Patient should be under the care of a physician while taking these supplements. This may not be reproduced without the permission of Dr. Shaili Deveshwar.  

## 2017-01-04 NOTE — Progress Notes (Signed)
WNL

## 2017-01-05 ENCOUNTER — Telehealth: Payer: Self-pay | Admitting: Radiology

## 2017-01-05 LAB — COMPLETE METABOLIC PANEL WITH GFR
ALT: 13 U/L (ref 6–29)
AST: 18 U/L (ref 10–35)
Albumin: 4 g/dL (ref 3.6–5.1)
Alkaline Phosphatase: 52 U/L (ref 33–130)
BUN: 12 mg/dL (ref 7–25)
CO2: 24 mmol/L (ref 20–31)
Calcium: 9.4 mg/dL (ref 8.6–10.4)
Chloride: 106 mmol/L (ref 98–110)
Creat: 0.87 mg/dL (ref 0.50–1.05)
GFR, Est African American: 87 mL/min (ref >=60)
GFR, Est Non African American: 75 mL/min (ref >=60)
Glucose, Bld: 67 mg/dL (ref 65–99)
Potassium: 4.2 mmol/L (ref 3.5–5.3)
Sodium: 140 mmol/L (ref 135–146)
Total Bilirubin: 0.3 mg/dL (ref 0.2–1.2)
Total Protein: 6.4 g/dL (ref 6.1–8.1)

## 2017-01-05 NOTE — Telephone Encounter (Signed)
-----   Message from [name], MD sent at 01/04/2017 10:24 PM EDT ----- WNL

## 2017-01-05 NOTE — Telephone Encounter (Signed)
I have called patient to advise labs are normal  

## 2017-01-11 ENCOUNTER — Telehealth: Payer: Self-pay | Admitting: Obstetrics and Gynecology

## 2017-01-11 NOTE — Telephone Encounter (Signed)
Spoke with patient to reschedule aex appointment and she will call back. Mail letter.

## 2017-01-23 ENCOUNTER — Ambulatory Visit (INDEPENDENT_AMBULATORY_CARE_PROVIDER_SITE_OTHER): Payer: BC Managed Care – PPO | Admitting: Medical

## 2017-01-23 ENCOUNTER — Encounter: Payer: Self-pay | Admitting: Medical

## 2017-01-23 VITALS — BP 142/90 | HR 61 | Wt 129.0 lb

## 2017-01-23 DIAGNOSIS — Z636 Dependent relative needing care at home: Secondary | ICD-10-CM

## 2017-01-23 DIAGNOSIS — I10 Essential (primary) hypertension: Secondary | ICD-10-CM

## 2017-01-23 DIAGNOSIS — F411 Generalized anxiety disorder: Secondary | ICD-10-CM

## 2017-01-23 DIAGNOSIS — R0789 Other chest pain: Secondary | ICD-10-CM | POA: Diagnosis not present

## 2017-01-23 DIAGNOSIS — E785 Hyperlipidemia, unspecified: Secondary | ICD-10-CM | POA: Diagnosis not present

## 2017-01-23 DIAGNOSIS — M2550 Pain in unspecified joint: Secondary | ICD-10-CM

## 2017-01-23 NOTE — Progress Notes (Signed)
Subjective:  Shari Lawrence is a 56 y.o. female who presents for BP concerns.    She notes in recent weeks had a few episodes of not feeling well.   One day in choir felt fatigued, lightheaded, had to go in her car and lie down, was weak being helped back  Into church.  Has had episodes of chest pains recently.   Was out of tow in [place] recently, had chest pain, went to the ED.  Reported in ED triage BP was 220/112, was hospitalize overnight.  She notes having extensive testing, labs, CT chest, some type of PET scan, nuclear stress test, and was told if she left against advised insurance may not pay and she may be responsible for bill herself.  She felt pressured to do all the testing she had done.   She also felt that the care in the ED was unusual, way different than she was expecting.   She notes the stress test was normal.  In addition to feeling not well in the ED, the stress of the ED with homeless folks asleep in the hallways, patients yelling and overall experience in the ED was quite stressful  In general she has remote hx/o hypertension, was on medication briefly but then was taken off and BPs remained fine for years.   Recently however, she has seen some readings in the 150/100s, but also can get normal readings.     She has been seeing rheumatology, there is suspecision she has Lupus, is currently taking Plaquenil although labs have reportedly been normal.   She has been on HRT patch for 2 years without BP issues.    The plaquenil was started recently.   She also uses some OTC supplements including tart cherry, ginger root, elderberry, turmeric, and olive leaf extract.   She does use some salt in the diet.  Sleep is not always the best being caregiver of her partners without a lot of help from her 3 siblings.  Her brother does help with her father.     She gets 4-5 hours of sleep on several nights in the week.  No other aggravating or relieving factors.    No other c/o.  The  following portions of the patient's history were reviewed and updated as appropriate: allergies, current medications, past family history, past medical history, past social history, past surgical history and problem list.  ROS Otherwise as in subjective above  Past Medical History:  Diagnosis Date  . Abnormal Pap smear 1988   treated with cryo  . Anemia 11/2010   hematology consult prior; etiology malabsorption and uterine bleeding  . Anxiety   . Gastric polyp   . GERD (gastroesophageal reflux disease)   . H/O bone density study 12/2007  . H/O hysterectomy for benign disease 5/14  . History of echocardiogram 03/2010   normal LV function, EF 60-65%, mild left atrial enlargement  . HTN (hypertension) 03/2010   hospitalization for HTN urgency  . Hyperlipemia   . Internal hemorrhoids   . Lumbar degenerative disc disease   . Migraine   . Myalgia   . Spondylosis, cervical   . Umbilical hernia   . Uterine fibroid    hx/o    Current Outpatient Prescriptions on File Prior to Visit  Medication Sig Dispense Refill  . amLODipine (NORVASC) 5 MG tablet TAKE 1 TABLET BY MOUTH EVERY DAY 90 tablet 1  . Ascorbic Acid (VITAMIN C) 1000 MG tablet Take 1,000 mg by mouth daily.     Marland Kitchen  atorvastatin (LIPITOR) 20 MG tablet TAKE 1 TABLET (20 MG TOTAL) BY MOUTH DAILY. 90 tablet 2  . B Complex Vitamins (B COMPLEX PO) Take by mouth.    Marland Kitchen BLACK ELDERBERRY,BERRY-FLOWER, PO Take by mouth.    . calcium carbonate (OS-CAL) 600 MG TABS Take 600 mg by mouth daily.     . [employer] ASPIRIN LOW DOSE 81 MG EC tablet TAKE 1 TABLET (81 MG TOTAL) BY MOUTH DAILY. 90 tablet 2  . estradiol (VIVELLE-DOT) 0.1 MG/24HR patch Place 1 patch (0.1 mg total) onto the skin 2 (two) times a week. 12 patch 4  . fish oil-omega-3 fatty acids 1000 MG capsule Take 2 g by mouth daily.    . hydroxychloroquine (PLAQUENIL) 200 MG tablet Take 1 tablet (200 mg total) by mouth 2 (two) times daily. Monday through Friday 40 tablet 2  . linaclotide  (LINZESS) 72 MCG capsule Take 1 capsule (72 mcg total) by mouth daily before breakfast. 15 capsule 0  . Multiple Vitamin (MULTIVITAMIN) tablet Take 1 tablet by mouth daily.     Marland Kitchen NEXIUM 40 MG capsule Take 1 capsule by mouth daily. Reported on 12/02/2015    . OLIVE LEAF PO Take by mouth.    . Probiotic Product (PROBIOTIC ADVANCED PO) Take by mouth.    . TURMERIC PO Take by mouth.    . conjugated estrogens (PREMARIN) vaginal cream Place 1 Applicatorful vaginally daily. Use pea size amount vaginally 3 times per week the first week, then 1-2 times per week (Patient not taking: Reported on 10/05/2016) 42.5 g 2   No current facility-administered medications on file prior to visit.      Objective:Exam BP (!) 142/90   Pulse 61   Wt 129 lb (58.5 kg)   LMP 10/25/2012 (Approximate)   SpO2 99%   BMI 22.14 kg/m   BP Readings from Last 3 Encounters:  01/23/17 (!) 142/90  01/04/17 106/60  11/17/16 120/80   Wt Readings from Last 3 Encounters:  01/23/17 129 lb (58.5 kg)  01/04/17 130 lb (59 kg)  11/17/16 126 lb 12.8 oz (57.5 kg)    General appearance: alert, no distress, WD/WN HEENT: normocephalic, sclerae anicteric, conjunctiva pink and moist, TMs pearly, nares patent, no discharge or erythema, pharynx normal Oral cavity: MMM, no lesions Chest wall: mild tenderness across upper chest bilat Neck: supple, no lymphadenopathy, no thyromegaly, no masses Heart: RRR, normal S1, S2, no murmurs Lungs: CTA bilaterally, no wheezes, rhonchi, or rales Abdomen: +bs, soft, non tender, non distended, no masses, no hepatomegaly, no splenomegaly Pulses: 2+ radial pulses, 2+ pedal pulses, normal cap refill Ext: no edema   Assessment: Encounter Diagnoses  Name Primary?  . Essential hypertension Yes  . Polyarthralgia   . Hyperlipidemia, unspecified hyperlipidemia type   . Care provider for parents   . Anxiety state   . Chest wall pain      Plan: HTN - discussed her recent symptoms, ED visit. She  signed today for [place] to obtain records from the recent ED visit at [facility].  For now, check BPs BID, record them and get me readings in 1 week.  Limit salt, alcohol, and reduce stress where possible.  Consider possible medications that may be triggering her BP response.  Prior to this visit she tends to run normal to low normal BPs.   F/u pending ED records and pending my communication with rheumatology.  discussed sleep hygiene  discussed her stressors, trying to work with siblings to help with burden of  caring for her parents  Chest wall tenderness - etiology unclear, but likely related to her autoimmune issues  F/u with rheumatology as planned

## 2017-01-31 ENCOUNTER — Telehealth: Payer: Self-pay

## 2017-01-31 NOTE — Telephone Encounter (Signed)
Records placed in your folder for review from [facility]. Shari Lawrence

## 2017-02-09 ENCOUNTER — Telehealth: Payer: Self-pay | Admitting: Medical

## 2017-02-09 NOTE — Telephone Encounter (Signed)
Call and see how she is feeling?  Does she have several blood pressure readings for me?    I have reviewed the records that came in from [facility].

## 2017-02-10 NOTE — Telephone Encounter (Signed)
Can you call this pt because per pt  Request.

## 2017-02-10 NOTE — Telephone Encounter (Signed)
Forwarding to [employer]

## 2017-02-10 NOTE — Telephone Encounter (Signed)
Ok, the hospital heart testing looked good.   So f/u with rheumatology.    If having trouble getting soon appt ,then call or recheck here.

## 2017-02-10 NOTE — Telephone Encounter (Signed)
Pt aware and will f/u with rheumatology./ RLB

## 2017-02-10 NOTE — Telephone Encounter (Signed)
She continues to have chest tightness and wants to see her rheumatologist. She did not increase her amlodipine, because her readings are normal or running below normal. Victorino December

## 2017-02-22 ENCOUNTER — Telehealth: Payer: Self-pay | Admitting: Obstetrics and Gynecology

## 2017-02-22 NOTE — Telephone Encounter (Signed)
Patient states she is having hot flashes and would like to speak to a nurse about it.

## 2017-02-23 NOTE — Telephone Encounter (Signed)
Spoke with patient. Patient states that she is currently on Estradiol 0.1 mg patch that she places twice weekly. Reports increased hot flashes that are causing discomfort. Has questions about menopausal symptoms and medication changes. Appointment scheduled for 02/28/2017 at 3 pm with Dr.Jerson. Patient is agreeable to date and time.  Routing to provider for final review. Patient agreeable to disposition. Will close encounter.

## 2017-02-25 HISTORY — PX: COLONOSCOPY: SHX174

## 2017-02-27 ENCOUNTER — Other Ambulatory Visit: Payer: Self-pay | Admitting: Medical

## 2017-02-28 ENCOUNTER — Encounter: Payer: Self-pay | Admitting: Obstetrics and Gynecology

## 2017-02-28 ENCOUNTER — Ambulatory Visit (INDEPENDENT_AMBULATORY_CARE_PROVIDER_SITE_OTHER): Payer: BC Managed Care – PPO | Admitting: Obstetrics and Gynecology

## 2017-02-28 VITALS — BP 120/78 | HR 64 | Resp 14 | Wt 132.0 lb

## 2017-02-28 DIAGNOSIS — R109 Unspecified abdominal pain: Secondary | ICD-10-CM | POA: Diagnosis not present

## 2017-02-28 DIAGNOSIS — R232 Flushing: Secondary | ICD-10-CM

## 2017-02-28 DIAGNOSIS — R102 Pelvic and perineal pain: Secondary | ICD-10-CM | POA: Diagnosis not present

## 2017-02-28 DIAGNOSIS — R194 Change in bowel habit: Secondary | ICD-10-CM

## 2017-02-28 DIAGNOSIS — K5900 Constipation, unspecified: Secondary | ICD-10-CM

## 2017-02-28 DIAGNOSIS — R195 Other fecal abnormalities: Secondary | ICD-10-CM

## 2017-02-28 NOTE — Progress Notes (Addendum)
GYNECOLOGY  VISIT   HPI: 56 y.o.   Divorced  [place] American  female   [place] with Patient's last menstrual period was 10/25/2012 (approximate).   here c/o increased hot flahes. She had a hysterectomy in 2014 for a symptomatic fibroid uterus.    She was started on ERT shortly after surgery. Started on the 0.1 mg patch, went down to 0.05 mg patch for about a year and then went back up to the 0.1 mg patch.  In the last 2 months her symptoms have worsened, mainly hot flashes. Not having bad night sweats. Having about 5 hot flashes a day, seem to last longer. Have gone from lasting 30 seconds to 5-10 minutes. She still has her ovaries. She feels more bloated in the last few months, feels her stomach is "pushed out". She is more constipated, recently started lizness. She has gone up to 3 weeks without a BM. Over the last few weeks the caliber of her stools has been smaller, has to strain. The constipation is worse in the last year. Colonoscopy last year was normal.  She has an autoimmune disorder, hurts throughout her pelvis and lower abdomen. She has been diagnosed with bilateral hip bursitis. She hurts all over in the am, stiff everywhere, even has pain in the bottom of her feet.  She was seen at [facility], BP was 220/118. She was admitted. Per primary MD note, cardiac w/u was good.   She works part time as a Orthoptist at [employer]. Cares for her elderly parents that live with her. She has a 67 year old daughter, currently living locally and getting her masters in sociology. Her daughter works with people with disabilities.   GYNECOLOGIC HISTORY: Patient's last menstrual period was 10/25/2012 (approximate). Contraception:hysterectomy  Menopausal hormone therapy: estradiol patch         OB History    Gravida Para Term Preterm AB Living   1 1 1  0 0 1   SAB TAB Ectopic Multiple Live Births   0 0 0 0 1         Patient Active Problem List   Diagnosis Date Noted  . Chronic constipation  11/17/2016  . Globus sensation 11/17/2016  . Primary osteoarthritis of both knees 10/05/2016  . Autoimmune disease ([place]) 09/29/2016  . Primary osteoarthritis of both hands 09/29/2016  . Family history of lupus erythematosus 09/20/2016  . Polyarthralgia 05/06/2016  . Clenching of teeth 05/06/2016  . Finger swelling 05/06/2016  . Morning stiffness of joints 05/06/2016  . Myalgia 05/06/2016  . Family history of rheumatoid arthritis 05/06/2016  . Essential hypertension 12/23/2014  . Hyperlipidemia 12/23/2014  . History of anemia 12/23/2014  . Care provider for parents 12/23/2014  . Impaired fasting glucose 10/25/2011  . Gluten intolerance 10/25/2011  . Gastroesophageal reflux disease without esophagitis 10/25/2011  . Anxiety state   . Iron deficiency anemia     Past Medical History:  Diagnosis Date  . Abnormal Pap smear 1988   treated with cryo  . Anemia 11/2010   hematology consult prior; etiology malabsorption and uterine bleeding  . Anxiety   . Gastric polyp   . GERD (gastroesophageal reflux disease)   . H/O bone density study 12/2007  . H/O hysterectomy for benign disease 5/14  . History of echocardiogram 03/2010   normal LV function, EF 60-65%, mild left atrial enlargement  . HTN (hypertension) 03/2010   hospitalization for HTN urgency  . Hyperlipemia   . Internal hemorrhoids   . Lumbar degenerative  disc disease   . Migraine   . Myalgia   . Spondylosis, cervical   . Umbilical hernia   . Uterine fibroid    hx/o     Past Surgical History:  Procedure Laterality Date  . CERVICAL FUSION  2010  . CERVIX LESION DESTRUCTION  1988  . CESAREAN SECTION    . CHOLECYSTECTOMY  2003  . COLONOSCOPY  08/2010   Dr. Collene Mares  . ESOPHAGOGASTRODUODENOSCOPY  08/2010   Dr. Collene Mares  . EXPLORATORY LAPAROTOMY    . MYOMECTOMY  1998  . PELVIC LAPAROSCOPY    . ROBOTIC ASSISTED LAPAROSCOPIC HYSTERECTOMY AND SALPINGECTOMY  10/2012   [facility]; laproscopic due to fibroids  . UMBILICAL HERNIA REPAIR   10/2013   infected laparoscopic port, mesh placement    Current Outpatient Prescriptions  Medication Sig Dispense Refill  . amLODipine (NORVASC) 5 MG tablet TAKE 1 TABLET BY MOUTH EVERY DAY 90 tablet 1  . Ascorbic Acid (VITAMIN C) 1000 MG tablet Take 1,000 mg by mouth daily.     Marland Kitchen atorvastatin (LIPITOR) 20 MG tablet TAKE 1 TABLET (20 MG TOTAL) BY MOUTH DAILY. 90 tablet 0  . B Complex Vitamins (B COMPLEX PO) Take by mouth.    . calcium carbonate (OS-CAL) 600 MG TABS Take 600 mg by mouth daily.     . [facility]S ASPIRIN LOW DOSE 81 MG EC tablet TAKE 1 TABLET (81 MG TOTAL) BY MOUTH DAILY. 90 tablet 2  . estradiol (VIVELLE-DOT) 0.1 MG/24HR patch Place 1 patch (0.1 mg total) onto the skin 2 (two) times a week. 12 patch 4  . fish oil-omega-3 fatty acids 1000 MG capsule Take 2 g by mouth daily.    . Ginger, Zingiber officinalis, (GINGER ROOT PO) Take by mouth.    . hydroxychloroquine (PLAQUENIL) 200 MG tablet Take 1 tablet (200 mg total) by mouth 2 (two) times daily. Monday through Friday 40 tablet 2  . linaclotide (LINZESS) 72 MCG capsule Take 1 capsule (72 mcg total) by mouth daily before breakfast. 15 capsule 0  . Misc Natural Products (TART CHERRY ADVANCED PO) Take by mouth.    . Multiple Vitamin (MULTIVITAMIN) tablet Take 1 tablet by mouth daily.     Marland Kitchen NEXIUM 40 MG capsule Take 1 capsule by mouth daily. Reported on 12/02/2015    . OLIVE LEAF PO Take by mouth.    . Probiotic Product (PROBIOTIC ADVANCED PO) Take by mouth.    . TURMERIC PO Take by mouth.     No current facility-administered medications for this visit.      ALLERGIES: Quinolones; Latex; Penicillins; Percocet [oxycodone-acetaminophen]; Sulfa drugs cross reactors; Vicodin [hydrocodone-acetaminophen]; and Gluten meal  Family History  Problem Relation Age of Onset  . Hypertension Mother   . Dementia Mother   . Colon cancer Mother 22  . Deep vein thrombosis Father   . Hypertension Father   . Cancer Father        prostate CA  . Heart  disease Father 10       CAD  . Hypertension Sister   . Hyperlipidemia Sister   . Hypertension Brother   . Hyperlipidemia Brother   . Hypertension Maternal Aunt   . Hyperlipidemia Maternal Aunt   . Rheum arthritis Maternal Aunt   . Diabetes Maternal Aunt        1 with type II, 1 with type 1  . Hypertension Brother   . Rheum arthritis Brother   . Diabetes Paternal Aunt  type II    Social History   Social History  . Marital status: Divorced    Spouse name: N/A  . Number of children: 1  . Years of education: N/A   Occupational History  .  [employer]    molecular Production designer, theatre/television/film, PhD candidate, [employer]   Social History Main Topics  . Smoking status: Never Smoker  . Smokeless tobacco: Never Used  . Alcohol use No  . Drug use: No  . Sexual activity: No   Other Topics Concern  . 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  Has 57 yo daughter.  Her siblings and her don't agree on helping take care of their parents, causes family tension.  As of 11/2014    Review of Systems  Constitutional:       Hot flashes   HENT: Negative.   Eyes: Negative.   Respiratory: Negative.   Cardiovascular: Positive for chest pain and palpitations.  Gastrointestinal: Positive for constipation.  Genitourinary: Negative.   Musculoskeletal: Positive for myalgias.  Skin: Negative.   Neurological: Negative.   Endo/Heme/Allergies: Negative.   Psychiatric/Behavioral: Negative.     PHYSICAL EXAMINATION:    BP 120/78 (BP Location: Right Arm, Patient Position: Sitting, Cuff Size: Normal)   Pulse 64   Resp 14   Wt 132 lb (59.9 kg)   LMP 10/25/2012 (Approximate)   BMI 22.66 kg/m     General appearance: alert, cooperative and appears stated  age Neck: no adenopathy, supple, symmetrical, trachea midline and thyroid normal to inspection and palpation Abdomen: soft, non-tender; very distended, no masses,  no organomegaly  Pelvic: External genitalia:  no lesions              Urethra:  normal appearing urethra with no masses, tenderness or lesions              Bartholins and Skenes: normal                 Cervix: absent              Bimanual Exam:  Uterus:  uterus absent              Adnexa: no mass, fullness, tenderness              Rectovaginal: Yes.  .  Confirms.              Anus:  normal sphincter tone, non tender hemorrhoid  Bladder: not tender  Pelvic floor: not tender  Chaperone was present for exam.  ASSESSMENT Hot flashes, on ERT Pelvic/abdominal pain Constipation Change in stool caliber. Normal colonoscopy last year Autoimmune disorder, ? Contributing to her hot flashes    PLAN Recent normal CBC with diff TSH, FSH, estradiol Discussed potential risks of ERT, wouldn't recommend increasing her dose Will get a copy of her recent hospitalization from [facility], want to see if she had imaging of her abdomen/pelvis (if not would recommend she have imaging either here or through Dr Collene Mares) Recommend she f/u with Dr Collene Mares for her bowel changes   An After Visit Summary was printed and given to the patient.  ~25 minutes face to face time of which over 50% was spent in counseling.   CC: Chana Bode, PA  Dr Collene Mares  Addendum: Records from [facility] reviewed. She had a CT of her ABD/PELVIS on 01/19/17 Results were normal. Absent uterus, no adnexal lesions, no free fluid, no bowel changes, normal appendix, normal kidneys, no ureteral obstructions.

## 2017-03-01 LAB — TSH: TSH: 0.966 u[IU]/mL (ref 0.450–4.500)

## 2017-03-01 LAB — ESTRADIOL: Estradiol: 24.5 pg/mL

## 2017-03-01 LAB — FOLLICLE STIMULATING HORMONE: FSH: 61 m[IU]/mL

## 2017-03-08 ENCOUNTER — Encounter (HOSPITAL_COMMUNITY): Payer: Self-pay | Admitting: Family Medicine

## 2017-03-08 ENCOUNTER — Telehealth: Payer: Self-pay | Admitting: Rheumatology

## 2017-03-08 ENCOUNTER — Ambulatory Visit (HOSPITAL_COMMUNITY)
Admission: EM | Admit: 2017-03-08 | Discharge: 2017-03-08 | Disposition: A | Discharge status: Home / Self Care | Payer: BC Managed Care – PPO | Attending: Family Medicine | Admitting: Family Medicine

## 2017-03-08 DIAGNOSIS — M2550 Pain in unspecified joint: Secondary | ICD-10-CM

## 2017-03-08 DIAGNOSIS — M7711 Lateral epicondylitis, right elbow: Secondary | ICD-10-CM

## 2017-03-08 MED ORDER — DICLOFENAC SODIUM 75 MG PO TBEC: 75.0000 mg | DELAYED_RELEASE_TABLET | Freq: Two times a day (BID) | ORAL | 0 refills | Status: DC

## 2017-03-08 NOTE — ED Triage Notes (Signed)
Pt presents with c/o right arm pain. The pain began last week. The pain is a cramping pain around her elbow. The pain increases when she flexes her arm. She tried heat, ice, rest, tylenol, ibuprofen with no relief. She does not recall any injury or heavy exertion prior to the pain. 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

## 2017-03-08 NOTE — Telephone Encounter (Signed)
We have nothing sooner, please see note from patient, we will discuss

## 2017-03-08 NOTE — Telephone Encounter (Signed)
Patient calling due to chest pains, tender to the touch. GP questioning inflammation. Patient would like to know if she needs to come in for an appt. Sooner than regular scheduled one. Please call to discuss.

## 2017-03-08 NOTE — Telephone Encounter (Signed)
We can do labs to see if she is having a flare otherwise she can see her PCP. ESR, ds , C3 C4. She can be ttd by PCP for the costochondritis.

## 2017-03-09 ENCOUNTER — Ambulatory Visit: Payer: BC Managed Care – PPO | Admitting: Medical

## 2017-03-09 NOTE — Addendum Note (Signed)
Addended by: Carole Binning on: 03/09/2017 10:19 AM   Modules accepted: Orders

## 2017-03-09 NOTE — ED Provider Notes (Signed)
[facility] [place]   [id number] 03/08/17 Arrival Time: 6063  ASSESSMENT & PLAN:  1. Lateral epicondylitis of right elbow     Meds ordered this encounter  Medications  . diclofenac (VOLTAREN) 75 MG EC tablet    Sig: Take 1 tablet (75 mg total) by mouth 2 (two) times daily.    Dispense:  14 tablet    Refill:  0   Will plan f/u if not showing improvement over the next week. Reviewed expectations re: course of current medical issues. Questions answered. Outlined signs and symptoms indicating need for more acute intervention. Patient verbalized understanding. After Visit Summary given.   SUBJECTIVE:  Shari Lawrence is a 56 y.o. female who presents with complaint of R elbow/forearm discomfort for the past week. Gradual onset. No injury/trauma. No repetitive movements. 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 R-handed. No swelling or skin changes. OTC ibuprofen prn with little relief.  ROS: As per HPI.   OBJECTIVE:  Vitals:   03/08/17 1817  BP: 129/88  Pulse: 64  Resp: 16  Temp: 98.7 F (37.1 C)  TempSrc: Oral  SpO2: 99%    General appearance: alert; no distress Eyes: PERRLA; EOMI; conjunctiva normal Extremities: RUE with FROM at shoulder and elbow; tender to palpation over R lateral epicondyle Skin: warm and dry Psychological: alert and cooperative; normal mood and affect   Allergies  Allergen Reactions  . Quinolones Anaphylaxis and Other (See Comments)    [place]ps developed all over her body  . Latex Itching and Other (See Comments)    Skin yellowing  . Penicillins   . Percocet [Oxycodone-Acetaminophen]   . Sulfa Drugs Cross Reactors Itching, Swelling and Other (See Comments)    Red face  . Vicodin [Hydrocodone-Acetaminophen] Itching and Nausea And Vomiting  . Gluten Meal     Other reaction(s): Other (See Comments) IBS    Past Medical History:  Diagnosis Date  . Abnormal Pap smear 1988   treated with cryo  . Anemia 11/2010   hematology consult prior; etiology malabsorption and uterine bleeding  . Anxiety   . Gastric polyp   . GERD (gastroesophageal reflux disease)   . H/O bone density study 12/2007  . H/O hysterectomy for benign disease 5/14  . History of echocardiogram 03/2010   normal LV function, EF 60-65%, mild left atrial enlargement  . HTN (hypertension) 03/2010   hospitalization for HTN urgency  . Hyperlipemia   . Internal hemorrhoids   . Lumbar degenerative disc disease   . Migraine   . Myalgia   . Spondylosis, cervical   . Umbilical hernia   . Uterine fibroid    hx/o    Social History   Social History  . Marital status: Divorced    Spouse name: N/A  . Number of children: 1  . Years of education: N/A   Occupational History  .  [employer]    molecular Production designer, theatre/television/film, PhD candidate, [employer]   Social History Main Topics  . Smoking status: Never Smoker  . Smokeless tobacco: Never Used  . Alcohol use No  . Drug use: No  . Sexual activity: Not on file   Other Topics Concern  . 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  Has 22  yo daughter.  Her siblings and her don't agree on helping take care of their parents, causes family tension.  As of 11/2014   Family History  Problem Relation Age of Onset  . Hypertension Mother   . Dementia Mother   . Colon cancer Mother 48  . Deep vein thrombosis Father   . Hypertension Father   . Cancer Father        prostate CA  . Heart disease Father 76       CAD  . Hypertension Sister   . Hyperlipidemia Sister   . Hypertension Brother   . Hyperlipidemia Brother   . Hypertension Maternal Aunt   . Hyperlipidemia Maternal Aunt   . Rheum arthritis Maternal Aunt   . Diabetes Maternal Aunt        1 with type  II, 1 with type 1  . Hypertension Brother   . Rheum arthritis Brother   . Diabetes Paternal Aunt        type II   Past Surgical History:  Procedure Laterality Date  . CERVICAL FUSION  2010  . CERVIX LESION DESTRUCTION  1988  . CESAREAN SECTION    . CHOLECYSTECTOMY  2003  . COLONOSCOPY  08/2010   Dr. Collene Mares  . ESOPHAGOGASTRODUODENOSCOPY  08/2010   Dr. Collene Mares  . EXPLORATORY LAPAROTOMY    . MYOMECTOMY  1998  . PELVIC LAPAROSCOPY    . ROBOTIC ASSISTED LAPAROSCOPIC HYSTERECTOMY AND SALPINGECTOMY  10/2012   [facility]; laproscopic due to fibroids  . UMBILICAL HERNIA REPAIR  10/2013   infected laparoscopic port, mesh placement     Vanessa Kick, MD 03/09/17 [phone]

## 2017-03-09 NOTE — Telephone Encounter (Signed)
Patient states she is not currently in a flare. Patient states she will call the office when she does and will have the labs performed.

## 2017-03-10 ENCOUNTER — Telehealth: Payer: Self-pay | Admitting: Obstetrics and Gynecology

## 2017-03-10 ENCOUNTER — Encounter: Payer: Self-pay | Admitting: Medical

## 2017-03-10 NOTE — Telephone Encounter (Signed)
Please call the patient and let her know that I have reviewed her abdominal/pelvic CT results from [facility] in 7/18. Imaging was normal. No explanation for her pain was seen. I would still recommend that she f/u with Dr Collene Mares for the changes in her stool

## 2017-03-13 NOTE — Telephone Encounter (Signed)
Spoke with patient and gave results and recommendations -eh  

## 2017-03-16 ENCOUNTER — Other Ambulatory Visit: Payer: Self-pay | Admitting: Gastroenterology

## 2017-03-16 DIAGNOSIS — R1310 Dysphagia, unspecified: Secondary | ICD-10-CM

## 2017-03-16 NOTE — Progress Notes (Signed)
Shari Dennin MD 

## 2017-03-23 ENCOUNTER — Other Ambulatory Visit: Payer: BC Managed Care – PPO

## 2017-03-30 DIAGNOSIS — Z79899 Other long term (current) drug therapy: Secondary | ICD-10-CM | POA: Insufficient documentation

## 2017-03-30 NOTE — Progress Notes (Deleted)
Office Visit Note  Patient: Shari Lawrence             Date of Birth: 01-19-1961           MRN: [id number]             PCP: Carlena Hurl, PA-C Referring: Carlena Hurl, PA-C Visit Date: 04/06/2017 Occupation: @GUAROCC @    Subjective:  No chief complaint on file.   History of Present Illness: Shari Lawrence is a 56 y.o. female ***   Activities of Daily Living:  Patient reports morning stiffness for *** {minute/hour:19697}.   Patient {ACTIONS;DENIES/REPORTS:21021675::"Denies"} nocturnal pain.  Difficulty dressing/grooming: {ACTIONS;DENIES/REPORTS:21021675::"Denies"} Difficulty climbing stairs: {ACTIONS;DENIES/REPORTS:21021675::"Denies"} Difficulty getting out of chair: {ACTIONS;DENIES/REPORTS:21021675::"Denies"} Difficulty using hands for taps, buttons, cutlery, and/or writing: {ACTIONS;DENIES/REPORTS:21021675::"Denies"}   No Rheumatology ROS completed.   PMFS History:  Patient Active Problem List   Diagnosis Date Noted  . High risk medication use 03/30/2017  . Chronic constipation 11/17/2016  . Globus sensation 11/17/2016  . Primary osteoarthritis of both knees 10/05/2016  . Autoimmune disease ([place]) 09/29/2016  . Primary osteoarthritis of both hands 09/29/2016  . Family history of lupus erythematosus 09/20/2016  . Polyarthralgia 05/06/2016  . Clenching of teeth 05/06/2016  . Finger swelling 05/06/2016  . Morning stiffness of joints 05/06/2016  . Myalgia 05/06/2016  . Family history of rheumatoid arthritis 05/06/2016  . Essential hypertension 12/23/2014  . Hyperlipidemia 12/23/2014  . History of anemia 12/23/2014  . Care provider for parents 12/23/2014  . Impaired fasting glucose 10/25/2011  . Gluten intolerance 10/25/2011  . Gastroesophageal reflux disease without esophagitis 10/25/2011  . Anxiety state   . Iron deficiency anemia     Past Medical History:  Diagnosis Date  . Abnormal Pap smear 1988   treated with cryo  . Anemia 11/2010   hematology  consult prior; etiology malabsorption and uterine bleeding  . Anxiety   . Gastric polyp   . GERD (gastroesophageal reflux disease)   . H/O bone density study 12/2007  . H/O hysterectomy for benign disease 5/14  . History of echocardiogram 03/2010   normal LV function, EF 60-65%, mild left atrial enlargement  . HTN (hypertension) 03/2010   hospitalization for HTN urgency  . Hyperlipemia   . Internal hemorrhoids   . Lumbar degenerative disc disease   . Migraine   . Myalgia   . Spondylosis, cervical   . Umbilical hernia   . Uterine fibroid    hx/o     Family History  Problem Relation Age of Onset  . Hypertension Mother   . Dementia Mother   . Colon cancer Mother 27  . Deep vein thrombosis Father   . Hypertension Father   . Cancer Father        prostate CA  . Heart disease Father 103       CAD  . Hypertension Sister   . Hyperlipidemia Sister   . Hypertension Brother   . Hyperlipidemia Brother   . Hypertension Maternal Aunt   . Hyperlipidemia Maternal Aunt   . Rheum arthritis Maternal Aunt   . Diabetes Maternal Aunt        1 with type II, 1 with type 1  . Hypertension Brother   . Rheum arthritis Brother   . Diabetes Paternal Aunt        type II   Past Surgical History:  Procedure Laterality Date  . CERVICAL FUSION  2010  . CERVIX LESION DESTRUCTION  1988  . CESAREAN SECTION    .  CHOLECYSTECTOMY  2003  . COLONOSCOPY  08/2010   Dr. Collene Mares  . ESOPHAGOGASTRODUODENOSCOPY  08/2010   Dr. Collene Mares  . EXPLORATORY LAPAROTOMY    . MYOMECTOMY  1998  . PELVIC LAPAROSCOPY    . ROBOTIC ASSISTED LAPAROSCOPIC HYSTERECTOMY AND SALPINGECTOMY  10/2012   [facility]; laproscopic due to fibroids  . 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  Has 7 yo daughter.  Her siblings and her don't agree on helping take care of their parents, causes family tension.  As of 11/2014     Objective: Vital Signs: LMP 10/25/2012 (Approximate)    Physical Exam   Musculoskeletal Exam: ***  CDAI Exam: No CDAI exam completed.    Investigation: Findings:  Normal Plaquenil eye exam on 11/2016   CBC Latest Ref Rng & Units 01/04/2017 08/30/2016 03/09/2016  WBC 3.8 - 10.8 K/uL 4.1 4.3 5.9  Hemoglobin 11.7 - 15.5 g/dL 12.2 12.4 12.0  Hematocrit 35.0 - 45.0 % 38.0 38.4 37.3  Platelets 140 - 400 K/uL 175 202 169   CMP Latest Ref Rng & Units 01/04/2017 08/30/2016 03/09/2016  Glucose 65 - 99 mg/dL 67 66 89  BUN 7 - 25 mg/dL 12 9 11   Creatinine 0.50 - 1.05 mg/dL 0.87 0.82 0.85  Sodium 135 - 146 mmol/L 140 141 139  Potassium 3.5 - 5.3 mmol/L 4.2 4.0 3.9  Chloride 98 - 110 mmol/L 106 103 105  CO2 20 - 31 mmol/L 24 23 25   Calcium 8.6 - 10.4 mg/dL 9.4 9.4 9.9  Total Protein 6.1 - 8.1 g/dL 6.4 7.2 6.5  Total Bilirubin 0.2 - 1.2 mg/dL 0.3 0.3 0.7  Alkaline Phos 33 - 130 U/L 52 52 54  AST 10 - 35 U/L 18 26 22   ALT 6 - 29 U/L 13 25 24    Imaging: No results found.  Speciality Comments: No specialty comments available.    Procedures:  No procedures performed Allergies: Quinolones; Latex; Penicillins; Percocet [oxycodone-acetaminophen]; Sulfa drugs cross reactors; Vicodin [hydrocodone-acetaminophen]; and Gluten meal   Assessment / Plan:     Visit Diagnoses: Autoimmune disease ([place]) - ANA 1:320 nucleolar, DS DNA 8 arthralgias, oral ulcers, nasal ulcer, history of facial rash, positive synovitis on ultrasound.  High risk medication use - ?? Plaquenil / has not been sent to pharmacy since April 2018  Primary osteoarthritis of both hands  Primary osteoarthritis of both knees  Family history of rheumatoid arthritis  Family history of lupus erythematosus  History of anemia  History of  anxiety  History of gastroesophageal reflux (GERD)  History of hypertension  History of hyperlipidemia  Gluten intolerance    Orders: No orders of the defined types were placed in this encounter.  No orders of the defined types were placed in this encounter.   Face-to-face time spent with patient was *** minutes. 50% of time was spent in counseling and coordination of care.  Follow-Up Instructions: No Follow-up on file.   [name], MD  Note - This record has been created using Editor, commissioning.  Chart creation errors have been sought, but may not always  have been located. Such creation errors do not reflect on  the standard of medical care.

## 2017-04-01 ENCOUNTER — Encounter (HOSPITAL_COMMUNITY): Payer: Self-pay | Admitting: Emergency Medicine

## 2017-04-01 ENCOUNTER — Emergency Department (HOSPITAL_COMMUNITY)
Admission: EM | Admit: 2017-04-01 | Discharge: 2017-04-01 | Disposition: A | Discharge status: Home / Self Care | Payer: BC Managed Care – PPO | Attending: Emergency Medicine | Admitting: Emergency Medicine

## 2017-04-01 DIAGNOSIS — R600 Localized edema: Secondary | ICD-10-CM | POA: Diagnosis present

## 2017-04-01 DIAGNOSIS — Z91040 Latex allergy status: Secondary | ICD-10-CM | POA: Insufficient documentation

## 2017-04-01 DIAGNOSIS — Z79899 Other long term (current) drug therapy: Secondary | ICD-10-CM | POA: Diagnosis not present

## 2017-04-01 DIAGNOSIS — I10 Essential (primary) hypertension: Secondary | ICD-10-CM | POA: Diagnosis not present

## 2017-04-01 DIAGNOSIS — Y999 Unspecified external cause status: Secondary | ICD-10-CM | POA: Diagnosis not present

## 2017-04-01 DIAGNOSIS — W57XXXA Bitten or stung by nonvenomous insect and other nonvenomous arthropods, initial encounter: Secondary | ICD-10-CM

## 2017-04-01 DIAGNOSIS — Y939 Activity, unspecified: Secondary | ICD-10-CM | POA: Insufficient documentation

## 2017-04-01 DIAGNOSIS — S60462A Insect bite (nonvenomous) of right middle finger, initial encounter: Secondary | ICD-10-CM | POA: Insufficient documentation

## 2017-04-01 DIAGNOSIS — Y929 Unspecified place or not applicable: Secondary | ICD-10-CM | POA: Diagnosis not present

## 2017-04-01 MED ORDER — BACITRACIN ZINC 500 UNIT/GM EX OINT: 1.0000 "application " | TOPICAL_OINTMENT | Freq: Two times a day (BID) | CUTANEOUS | 0 refills | Status: DC

## 2017-04-01 MED ORDER — ACETAMINOPHEN 500 MG PO TABS: 500.0000 mg | ORAL_TABLET | Freq: Four times a day (QID) | ORAL | 0 refills | Status: AC | PRN

## 2017-04-01 NOTE — ED Provider Notes (Signed)
[facility] DEPT Provider Note   CSN: [id number] Arrival date & time: 04/01/17  6160     History   Chief Complaint Chief Complaint  Patient presents with  . Insect Bite    HPI Shari Lawrence is a 56 y.o. female.  Shari Lawrence is a 56 y.o. Female who presents to the ED complaining of an insect sting prior to arrival. Patient reports she was outdoors when she was stung by something on her right middle finger. She reports she believes the insect was black. She is unsure exactly what it was but believes it may have been a bee. She reports her finger began to swell and she placed ice on the area. She denies any other symptoms or treatment prior to arrival. No history of bee allergy. She denies fevers, trouble breathing, tongue swelling, lip swelling, shortness of breath, rashes or numbness.   The history is provided by the patient and medical records.    Past Medical History:  Diagnosis Date  . Abnormal Pap smear 1988   treated with cryo  . Anemia 11/2010   hematology consult prior; etiology malabsorption and uterine bleeding  . Anxiety   . Gastric polyp   . GERD (gastroesophageal reflux disease)   . H/O bone density study 12/2007  . H/O hysterectomy for benign disease 5/14  . History of echocardiogram 03/2010   normal LV function, EF 60-65%, mild left atrial enlargement  . HTN (hypertension) 03/2010   hospitalization for HTN urgency  . Hyperlipemia   . Internal hemorrhoids   . Lumbar degenerative disc disease   . Migraine   . Myalgia   . Spondylosis, cervical   . Umbilical hernia   . Uterine fibroid    hx/o     Patient Active Problem List   Diagnosis Date Noted  . High risk medication use 03/30/2017  . Chronic constipation 11/17/2016  . Globus sensation 11/17/2016  . Primary osteoarthritis of both knees 10/05/2016  . Autoimmune disease ([place]) 09/29/2016  . Primary osteoarthritis of both hands 09/29/2016  . Family history of lupus erythematosus 09/20/2016  .  Polyarthralgia 05/06/2016  . Clenching of teeth 05/06/2016  . Finger swelling 05/06/2016  . Morning stiffness of joints 05/06/2016  . Myalgia 05/06/2016  . Family history of rheumatoid arthritis 05/06/2016  . Essential hypertension 12/23/2014  . Hyperlipidemia 12/23/2014  . History of anemia 12/23/2014  . Care provider for parents 12/23/2014  . Impaired fasting glucose 10/25/2011  . Gluten intolerance 10/25/2011  . Gastroesophageal reflux disease without esophagitis 10/25/2011  . Anxiety state   . Iron deficiency anemia     Past Surgical History:  Procedure Laterality Date  . CERVICAL FUSION  2010  . CERVIX LESION DESTRUCTION  1988  . CESAREAN SECTION    . CHOLECYSTECTOMY  2003  . COLONOSCOPY  08/2010   Dr. Collene Mares  . ESOPHAGOGASTRODUODENOSCOPY  08/2010   Dr. Collene Mares  . EXPLORATORY LAPAROTOMY    . MYOMECTOMY  1998  . PELVIC LAPAROSCOPY    . ROBOTIC ASSISTED LAPAROSCOPIC HYSTERECTOMY AND SALPINGECTOMY  10/2012   [facility]; laproscopic due to fibroids  . UMBILICAL HERNIA REPAIR  10/2013   infected laparoscopic port, mesh placement    OB History    Gravida Para Term Preterm AB Living   1 1 1  0 0 1   SAB TAB Ectopic Multiple Live Births   0 0 0 0 1       Home Medications    Prior to Admission medications   Medication  Sig Start Date End Date Taking? Authorizing Provider  acetaminophen (TYLENOL) 500 MG tablet Take 1 tablet (500 mg total) by mouth every 6 (six) hours as needed. 04/01/17   Waynetta Pean, PA-C  amLODipine (NORVASC) 5 MG tablet TAKE 1 TABLET BY MOUTH EVERY DAY 12/05/16   Tysinger, [name], PA-C  Ascorbic Acid (VITAMIN C) 1000 MG tablet Take 1,000 mg by mouth daily.     [provider]  atorvastatin (LIPITOR) 20 MG tablet TAKE 1 TABLET (20 MG TOTAL) BY MOUTH DAILY. 02/28/17   Tysinger, [name], PA-C  B Complex Vitamins (B COMPLEX PO) Take by mouth.    [provider]  bacitracin ointment Apply 1 application topically 2 (two) times daily. 04/01/17   Waynetta Pean, PA-C  calcium carbonate (OS-CAL) 600 MG TABS Take 600 mg by mouth daily.     [provider]  [facility] ASPIRIN LOW DOSE 81 MG EC tablet TAKE 1 TABLET (81 MG TOTAL) BY MOUTH DAILY. 11/17/16   Tysinger, [name], PA-C  diclofenac (VOLTAREN) 75 MG EC tablet Take 1 tablet (75 mg total) by mouth 2 (two) times daily. 03/08/17   Vanessa Kick, MD  estradiol (VIVELLE-DOT) 0.1 MG/24HR patch Place 1 patch (0.1 mg total) onto the skin 2 (two) times a week. 03/31/16   Kem Boroughs, FNP  fish oil-omega-3 fatty acids 1000 MG capsule Take 2 g by mouth daily.    [provider]  Ginger, Zingiber officinalis, (GINGER ROOT PO) Take by mouth.    [provider]  hydroxychloroquine (PLAQUENIL) 200 MG tablet Take 1 tablet (200 mg total) by mouth 2 (two) times daily. Monday through Friday 10/05/16   [name], MD  linaclotide Rolan Lipa) 72 MCG capsule Take 1 capsule (72 mcg total) by mouth daily before breakfast. 11/17/16   Tysinger, [name], PA-C  Misc Natural Products (TART CHERRY ADVANCED PO) Take by mouth.    [provider]  Multiple Vitamin (MULTIVITAMIN) tablet Take 1 tablet by mouth daily.     [provider]  NEXIUM 40 MG capsule Take 1 capsule by mouth daily. Reported on 12/02/2015 03/13/15   [provider]  OLIVE LEAF PO Take by mouth.    [provider]  Probiotic Product (PROBIOTIC ADVANCED PO) Take by mouth.    [provider]  TURMERIC PO Take by mouth.    [provider]    Family History Family History  Problem Relation Age of Onset  . Hypertension Mother   . Dementia Mother   . Colon cancer Mother 32  . Deep vein thrombosis Father   . Hypertension Father   . Cancer Father        prostate CA  . Heart disease Father 41       CAD  . Hypertension Sister   . Hyperlipidemia Sister   . Hypertension Brother   . Hyperlipidemia Brother   . Hypertension Maternal Aunt   . Hyperlipidemia Maternal Aunt   . Rheum  arthritis Maternal Aunt   . Diabetes Maternal Aunt        1 with type II, 1 with type 1  . Hypertension Brother   . Rheum arthritis Brother   . Diabetes Paternal Aunt        type II    Social History Social History  Substance Use Topics  . Smoking status: Never Smoker  . Smokeless tobacco: Never Used  . Alcohol use No     Allergies   Quinolones; Latex; Penicillins; Percocet [  oxycodone-acetaminophen]; Sulfa drugs cross reactors; Vicodin [hydrocodone-acetaminophen]; and Gluten meal   Review of Systems Review of Systems  Constitutional: Negative for fever.  HENT: Negative for facial swelling and trouble swallowing.   Respiratory: Negative for shortness of breath.   Gastrointestinal: Negative for nausea and vomiting.  Skin: Positive for wound. Negative for rash.     Physical Exam Updated Vital Signs BP (!) 136/98   Pulse 75   Temp 98 F (36.7 C)   Resp 18   LMP 10/25/2012 (Approximate)   SpO2 100%   Physical Exam  Constitutional: She appears well-developed and well-nourished. No distress.  HENT:  Head: Normocephalic and atraumatic.  Mouth/Throat: Oropharynx is clear and moist.  Eyes: Right eye exhibits no discharge. Left eye exhibits no discharge.  Cardiovascular: Intact distal pulses.   Bilateral radial pulses are intact.  Pulmonary/Chest: Effort normal. No stridor. No respiratory distress.  Musculoskeletal: Normal range of motion. She exhibits no edema or tenderness.  Neurological: She is alert. Coordination normal.  Skin: Skin is warm and dry. Capillary refill takes less than 2 seconds. No rash noted. She is not diaphoretic. No erythema. No pallor.  No overlying skin changes to her right hand. No evidence of insect bite or sting remaining. No evidence of any stinger remaining in her finger. No finger swelling. No rashes.   Psychiatric: She has a normal mood and affect. Her behavior is normal.  Nursing note and vitals reviewed.    ED Treatments / Results    Labs (all labs ordered are listed, but only abnormal results are displayed) Labs Reviewed - No data to display  EKG  EKG Interpretation None       Radiology No results found.  Procedures Procedures (including critical care time)  Medications Ordered in ED Medications - No data to display   Initial Impression / Assessment and Plan / ED Course  I have reviewed the triage vital signs and the nursing notes.  Pertinent labs & imaging results that were available during my care of the patient were reviewed by me and considered in my medical decision making (see chart for details).    This  is a 56 y.o. Female who presents to the ED complaining of an insect sting prior to arrival. Patient reports she was outdoors when she was stung by something on her right middle finger. She reports she believes the insect was black. She is unsure exactly what it was but believes it may have been a bee. She reports her finger began to swell and she placed ice on the area. She denies any other symptoms or treatment prior to arrival. No history of bee allergy. On exam patient is afebrile nontoxic appearing. No evidence of any insect bite or sting on my exam. No rashes or hives noted. No tongue or lip swelling. No evidence of any remaining stinger. I encouraged her to watch for signs of infection and use Tylenol and ice for pain control. Return precautions discussed. I advised the patient to follow-up with their primary care provider this week. I advised the patient to return to the emergency department with new or worsening symptoms or new concerns. The patient verbalized understanding and agreement with plan.     Final Clinical Impressions(s) / ED Diagnoses   Final diagnoses:  Insect bite, initial encounter    New Prescriptions New Prescriptions   ACETAMINOPHEN (TYLENOL) 500 MG TABLET    Take 1 tablet (500 mg total) by mouth every 6 (six) hours as needed.   BACITRACIN  OINTMENT    Apply 1  application topically 2 (two) times daily.     Waynetta Pean, PA-C 04/01/17 [place], Grayce Sessions, MD 04/02/17 1252

## 2017-04-01 NOTE — ED Notes (Signed)
Declined W/C at D/C and was escorted to lobby by RN. 

## 2017-04-01 NOTE — ED Triage Notes (Signed)
Pt states she saw a black bug bite her finger this morning, with stinging sensation. Swelling to right middle finger.

## 2017-04-03 ENCOUNTER — Ambulatory Visit: Payer: BC Managed Care – PPO | Admitting: Nurse Practitioner

## 2017-04-06 ENCOUNTER — Ambulatory Visit: Payer: BC Managed Care – PPO | Admitting: Rheumatology

## 2017-04-07 NOTE — Progress Notes (Signed)
Office Visit Note  Patient: Shari Lawrence             Date of Birth: 01-Feb-1961           MRN: [id number]             PCP: Carlena Hurl, PA-C Referring: Carlena Hurl, PA-C Visit Date: 04/11/2017 Occupation: _0 @    Subjective:  Pain hands   History of Present Illness: Shari Lawrence is a 56 y.o. female with history of autoimmune disease and osteoarthritis. She is feeling much better since she started Plaquenil in June 2018. She has noticed improvement in her joint pain and stiffness and fatigue. She's been doing a lot of yard work lately. She states there were episodes when her hands still hurt more and one time her right fifth finger started triggering which has improved now. She has not had any recurrent oral ulcers or nasal ulcers recently. She had a rash on her forehead which has improved now.  Activities of Daily Living:  Patient reports morning stiffness for 1 hour.   Patient Denies nocturnal pain.  Difficulty dressing/grooming: Denies Difficulty climbing stairs: Reports Difficulty getting out of chair: Reports Difficulty using hands for taps, buttons, cutlery, and/or writing: Reports   Review of Systems  Constitutional: Positive for fatigue. Negative for night sweats, weight gain, weight loss and weakness.  HENT: Positive for mouth dryness. Negative for mouth sores, trouble swallowing, trouble swallowing and nose dryness.   Eyes: Positive for dryness. Negative for pain, redness and visual disturbance.  Respiratory: Negative for cough, shortness of breath and difficulty breathing.   Cardiovascular: Negative for chest pain, palpitations, hypertension, irregular heartbeat and swelling in legs/feet.  Gastrointestinal: Positive for constipation. Negative for blood in stool and diarrhea.  Endocrine: Negative for cold intolerance, excessive thirst and increased urination.  Genitourinary: Negative for painful urination and vaginal dryness.  Musculoskeletal: Positive  for arthralgias, joint pain, morning stiffness and muscle tenderness. Negative for gait problem, joint swelling, myalgias, muscle weakness and myalgias.  Skin: Positive for rash. Negative for color change, hair loss, skin tightness, ulcers and sensitivity to sunlight.  Allergic/Immunologic: Negative for susceptible to infections.  Neurological: Negative for dizziness, light-headedness, numbness, headaches, memory loss and night sweats. Tremors: Reports.  Hematological: Negative for bruising/bleeding tendency and swollen glands.  Psychiatric/Behavioral: Positive for sleep disturbance. Negative for depressed mood. The patient is not nervous/anxious.     PMFS History:  Patient Active Problem List   Diagnosis Date Noted  . High risk medication use 03/30/2017  . Chronic constipation 11/17/2016  . Globus sensation 11/17/2016  . Primary osteoarthritis of both knees 10/05/2016  . Autoimmune disease ([place]) 09/29/2016  . Primary osteoarthritis of both hands 09/29/2016  . Family history of lupus erythematosus 09/20/2016  . Polyarthralgia 05/06/2016  . Clenching of teeth 05/06/2016  . Finger swelling 05/06/2016  . Morning stiffness of joints 05/06/2016  . Myalgia 05/06/2016  . Family history of rheumatoid arthritis 05/06/2016  . Essential hypertension 12/23/2014  . Hyperlipidemia 12/23/2014  . History of anemia 12/23/2014  . Care provider for parents 12/23/2014  . Impaired fasting glucose 10/25/2011  . Gluten intolerance 10/25/2011  . Gastroesophageal reflux disease without esophagitis 10/25/2011  . Anxiety state   . Iron deficiency anemia     Past Medical History:  Diagnosis Date  . Abnormal Pap smear 1988   treated with cryo  . Anemia 11/2010   hematology consult prior; etiology malabsorption and uterine bleeding  . Anxiety   .  Gastric polyp   . GERD (gastroesophageal reflux disease)   . H/O bone density study 12/2007  . H/O hysterectomy for benign disease 5/14  . History of  echocardiogram 03/2010   normal LV function, EF 60-65%, mild left atrial enlargement  . HTN (hypertension) 03/2010   hospitalization for HTN urgency  . Hyperlipemia   . Internal hemorrhoids   . Lumbar degenerative disc disease   . Migraine   . Myalgia   . Spondylosis, cervical   . Umbilical hernia   . Uterine fibroid    hx/o     Family History  Problem Relation Age of Onset  . Hypertension Mother   . Dementia Mother   . Colon cancer Mother 28  . Deep vein thrombosis Father   . Hypertension Father   . Cancer Father        prostate CA  . Heart disease Father 71       CAD  . Hypertension Sister   . Hyperlipidemia Sister   . Hypertension Brother   . Hyperlipidemia Brother   . Hypertension Maternal Aunt   . Hyperlipidemia Maternal Aunt   . Rheum arthritis Maternal Aunt   . Diabetes Maternal Aunt        1 with type II, 1 with type 1  . Hypertension Brother   . Rheum arthritis Brother   . Diabetes Paternal Aunt        type II   Past Surgical History:  Procedure Laterality Date  . CERVICAL FUSION  2010  . CERVIX LESION DESTRUCTION  1988  . CESAREAN SECTION    . CHOLECYSTECTOMY  2003  . COLONOSCOPY  08/2010   Dr. Collene Mares  . ESOPHAGOGASTRODUODENOSCOPY  08/2010   Dr. Collene Mares  . EXPLORATORY LAPAROTOMY    . HERNIA REPAIR    . MYOMECTOMY  1998  . PELVIC LAPAROSCOPY    . ROBOTIC ASSISTED LAPAROSCOPIC HYSTERECTOMY AND SALPINGECTOMY  10/2012   [facility]; laproscopic due to fibroids  . 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  Has 85 yo daughter.  Her siblings and her don't agree on helping take care of their parents, causes family tension.  As of 11/2014      Objective: Vital Signs: BP 135/82 (BP Location: Left Arm, Patient Position: Sitting, Cuff Size: Normal)   Pulse (!) 54   Resp 14   Ht _0  (1.626 m)   Wt 125 lb (56.7 kg)   LMP 10/25/2012 (Approximate)   BMI 21.46 kg/m    Physical Exam  Constitutional: She is oriented to person, place, and time. She appears well-developed and well-nourished.  HENT:  Head: Normocephalic and atraumatic.  Eyes: Conjunctivae and EOM are normal.  Neck: Normal range of motion.  Cardiovascular: Normal rate, regular rhythm, normal heart sounds and intact distal pulses.   Pulmonary/Chest: Effort normal and breath sounds normal.  Abdominal: Soft. Bowel sounds are normal.  Lymphadenopathy:    She has no cervical adenopathy.  Neurological: She is alert and oriented to person, place, and time.  Skin: Skin is warm and dry. Capillary refill takes less than 2 seconds.  Psychiatric: She has a normal mood and affect. Her behavior is normal.  Nursing note and vitals  reviewed.    Musculoskeletal Exam: C-spine and thoracic lumbar spine good range of motion. Shoulder joints elbow joints wrist joint MCPs PIPs DIPs with good range of motion with no synovitis. Hip joints knee joints ankles MTPs PIPs DIPs with good range of motion with no synovitis.   CDAI Exam: CDAI Homunculus Exam:   Joint Counts:  CDAI Tender Joint count: 0 CDAI Swollen Joint count: 0  Global Assessments:  Patient Global Assessment: 4 Provider Global Assessment: 2  CDAI Calculated Score: 6    Investigation: Findings:  12/06/16 normal Plaquenil eye exam   CBC Latest Ref Rng & Units 01/04/2017 08/30/2016 03/09/2016  WBC 3.8 - 10.8 K/uL 4.1 4.3 5.9  Hemoglobin 11.7 - 15.5 g/dL 12.2 12.4 12.0  Hematocrit 35.0 - 45.0 % 38.0 38.4 37.3  Platelets 140 - 400 K/uL 175 202 169   CMP Latest Ref Rng & Units 01/04/2017 08/30/2016 03/09/2016  Glucose 65 - 99 mg/dL 67 66 89  BUN 7 - 25 mg/dL _0 Creatinine 0.50 - 1.05 mg/dL 0.87 0.82 0.85   Sodium 135 - 146 mmol/L 140 141 139  Potassium 3.5 - 5.3 mmol/L 4.2 4.0 3.9  Chloride 98 - 110 mmol/L 106 103 105  CO2 20 - 31 mmol/L _1 Calcium 8.6 - 10.4 mg/dL 9.4 9.4 9.9  Total Protein 6.1 - 8.1 g/dL 6.4 7.2 6.5  Total Bilirubin 0.2 - 1.2 mg/dL 0.3 0.3 0.7  Alkaline Phos 33 - 130 U/L 52 52 54  AST 10 - 35 U/L _2 ALT 6 - 29 U/L _3 Imaging: No results found.  Speciality Comments: No specialty comments available.    Procedures:  No procedures performed Allergies: Quinolones; Latex; Penicillins; Percocet [oxycodone-acetaminophen]; Sulfa drugs cross reactors; Vicodin [hydrocodone-acetaminophen]; and Gluten meal   Assessment / Plan:     Visit Diagnoses: Autoimmune disease ([place]) -  ANA 1:320 NO, ds8, RF-,CCP-,History of nasal ulcers and oral ulcers in the past, positive synovitis in left hand MCPs on [place]. She is clinically doing much better on Plaquenil. Her arthralgias have improved. She has had no recent nasal ulcers or oral ulcers. No recent rash. I will check C3-C4, double-stranded DNA and ESR today.  High risk medication use - Plaquenil 200 mg po bid M-F (11/2016) - Plan: CBC with Differential/Platelet, COMPLETE METABOLIC PANEL WITH GFR I will check labs today and then every 4 months to monitor for drug toxicity. I will also check her double-stranded DNA complements.  Primary osteoarthritis of both hands - mild. Joint protection and muscle strengthening discussed.  Primary osteoarthritis of both knees: She has mild discomfort.  Family history of rheumatoid arthritis  Family history of lupus erythematosus  Myofascial pain: She does have some generalized pain and discomfort. She has had some costochondral pain which I believe was related to myofascial pain.  Anxiety state  Care provider for parents  Essential hypertension: Blood pressure is slightly elevated which may be related to wearing Lorenz Coaster. She's under stress due to her dad's  illness.  Hyperlipidemia, unspecified hyperlipidemia type    Orders: No orders of the defined types were placed in this encounter.  No orders of the defined types were placed in this encounter.   Follow-Up Instructions: Return in about 4 months (around 08/12/2017) for Autoimmune disease, Osteoarthritis.   [name], MD  Note - This record has been created using Editor, commissioning.  Chart creation errors have been sought, but may not always  have been located. Such creation errors do not reflect on  the standard of medical care.

## 2017-04-11 ENCOUNTER — Encounter: Payer: Self-pay | Admitting: Rheumatology

## 2017-04-11 ENCOUNTER — Ambulatory Visit (INDEPENDENT_AMBULATORY_CARE_PROVIDER_SITE_OTHER): Payer: BC Managed Care – PPO | Admitting: Rheumatology

## 2017-04-11 VITALS — BP 135/82 | HR 54 | Resp 14 | Ht 64.0 in | Wt 125.0 lb

## 2017-04-11 DIAGNOSIS — Z79899 Other long term (current) drug therapy: Secondary | ICD-10-CM | POA: Diagnosis not present

## 2017-04-11 DIAGNOSIS — D8989 Other specified disorders involving the immune mechanism, not elsewhere classified: Secondary | ICD-10-CM | POA: Diagnosis not present

## 2017-04-11 DIAGNOSIS — M170 Bilateral primary osteoarthritis of knee: Secondary | ICD-10-CM

## 2017-04-11 DIAGNOSIS — F411 Generalized anxiety disorder: Secondary | ICD-10-CM

## 2017-04-11 DIAGNOSIS — M2550 Pain in unspecified joint: Secondary | ICD-10-CM

## 2017-04-11 DIAGNOSIS — M19042 Primary osteoarthritis, left hand: Secondary | ICD-10-CM

## 2017-04-11 DIAGNOSIS — Z840 Family history of diseases of the skin and subcutaneous tissue: Secondary | ICD-10-CM

## 2017-04-11 DIAGNOSIS — Z8261 Family history of arthritis: Secondary | ICD-10-CM

## 2017-04-11 DIAGNOSIS — M359 Systemic involvement of connective tissue, unspecified: Secondary | ICD-10-CM

## 2017-04-11 DIAGNOSIS — Z636 Dependent relative needing care at home: Secondary | ICD-10-CM | POA: Diagnosis not present

## 2017-04-11 DIAGNOSIS — M7918 Myalgia, other site: Secondary | ICD-10-CM

## 2017-04-11 DIAGNOSIS — I10 Essential (primary) hypertension: Secondary | ICD-10-CM

## 2017-04-11 DIAGNOSIS — M19041 Primary osteoarthritis, right hand: Secondary | ICD-10-CM | POA: Diagnosis not present

## 2017-04-11 DIAGNOSIS — E785 Hyperlipidemia, unspecified: Secondary | ICD-10-CM

## 2017-04-11 NOTE — Patient Instructions (Signed)
Standing Labs We placed an order today for your standing lab work.    Please come back and get your standing labs in February and then every 5 months  We have open lab Monday through Friday from 8:30-11:30 AM and 1:30-4 PM at the office of Dr. [name].   The office is located at [address], [place], [place], [place] [address] No appointment is necessary.   Labs are drawn by [facility].  You may receive a bill from [facility] for your lab work. If you have any questions regarding directions or hours of operation,  please call [phone].

## 2017-04-12 LAB — ANTI-DNA ANTIBODY, DOUBLE-STRANDED: ds DNA Ab: 8 IU/mL — ABNORMAL HIGH

## 2017-04-12 LAB — SEDIMENTATION RATE: Sed Rate: 17 mm/h (ref 0–30)

## 2017-04-12 LAB — COMPLETE METABOLIC PANEL WITH GFR
AG Ratio: 1.7 (calc) (ref 1.0–2.5)
ALT: 21 U/L (ref 6–29)
AST: 22 U/L (ref 10–35)
Albumin: 4.4 g/dL (ref 3.6–5.1)
Alkaline phosphatase (APISO): 53 U/L (ref 33–130)
BUN: 9 mg/dL (ref 7–25)
CO2: 26 mmol/L (ref 20–32)
Calcium: 9.7 mg/dL (ref 8.6–10.4)
Chloride: 105 mmol/L (ref 98–110)
Creat: 0.89 mg/dL (ref 0.50–1.05)
GFR, Est African American: 85 mL/min/{1.73_m2} (ref >=60)
GFR, Est Non African American: 73 mL/min/{1.73_m2} (ref >=60)
Globulin: 2.6 g/dL (calc) (ref 1.9–3.7)
Glucose, Bld: 88 mg/dL (ref 65–99)
Potassium: 4.4 mmol/L (ref 3.5–5.3)
Sodium: 140 mmol/L (ref 135–146)
Total Bilirubin: 0.4 mg/dL (ref 0.2–1.2)
Total Protein: 7 g/dL (ref 6.1–8.1)

## 2017-04-12 LAB — CBC WITH DIFFERENTIAL/PLATELET
Basophils Absolute: 38 cells/uL (ref 0–200)
Basophils Relative: 0.8 %
Eosinophils Absolute: 149 cells/uL (ref 15–500)
Eosinophils Relative: 3.1 %
HCT: 37.7 % (ref 35.0–45.0)
Hemoglobin: 12.2 g/dL (ref 11.7–15.5)
Lymphs Abs: 1690 cells/uL (ref 850–3900)
MCH: 29.1 pg (ref 27.0–33.0)
MCHC: 32.4 g/dL (ref 32.0–36.0)
MCV: 90 fL (ref 80.0–100.0)
MPV: 12.6 fL — ABNORMAL HIGH (ref 7.5–12.5)
Monocytes Relative: 8.2 %
Neutro Abs: 2530 cells/uL (ref 1500–7800)
Neutrophils Relative %: 52.7 %
Platelets: 184 10*3/uL (ref 140–400)
RBC: 4.19 10*6/uL (ref 3.80–5.10)
RDW: 13 % (ref 11.0–15.0)
Total Lymphocyte: 35.2 %
WBC mixed population: 394 cells/uL (ref 200–950)
WBC: 4.8 10*3/uL (ref 3.8–10.8)

## 2017-04-12 LAB — C3 AND C4
C3 Complement: 131 mg/dL (ref 83–193)
C4 Complement: 26 mg/dL (ref 15–57)

## 2017-04-12 NOTE — Progress Notes (Signed)
Labs are stable.

## 2017-04-12 NOTE — Progress Notes (Signed)
WNLs

## 2017-04-13 ENCOUNTER — Encounter: Payer: Self-pay | Admitting: Obstetrics and Gynecology

## 2017-04-13 ENCOUNTER — Other Ambulatory Visit: Payer: Self-pay | Admitting: Obstetrics and Gynecology

## 2017-04-13 ENCOUNTER — Ambulatory Visit (INDEPENDENT_AMBULATORY_CARE_PROVIDER_SITE_OTHER): Payer: BC Managed Care – PPO | Admitting: Obstetrics and Gynecology

## 2017-04-13 VITALS — BP 118/70 | HR 60 | Resp 14 | Ht 64.0 in | Wt 124.0 lb

## 2017-04-13 DIAGNOSIS — K5900 Constipation, unspecified: Secondary | ICD-10-CM

## 2017-04-13 DIAGNOSIS — Z01419 Encounter for gynecological examination (general) (routine) without abnormal findings: Secondary | ICD-10-CM

## 2017-04-13 DIAGNOSIS — Z79890 Hormone replacement therapy (postmenopausal): Secondary | ICD-10-CM

## 2017-04-13 DIAGNOSIS — Z1231 Encounter for screening mammogram for malignant neoplasm of breast: Secondary | ICD-10-CM

## 2017-04-13 DIAGNOSIS — Z1272 Encounter for screening for malignant neoplasm of vagina: Secondary | ICD-10-CM

## 2017-04-13 MED ORDER — ESTRADIOL 0.075 MG/24HR TD PTTW: 1.0000 | MEDICATED_PATCH | TRANSDERMAL | 1 refills | Status: DC

## 2017-04-13 NOTE — Patient Instructions (Signed)
EXERCISE AND DIET:  We recommended that you start or continue a regular exercise program for good health. Regular exercise means any activity that makes your heart beat faster and makes you sweat.  We recommend exercising at least 30 minutes per day at least 3 days a week, preferably 4 or 5.  We also recommend a diet low in fat and sugar.  Inactivity, poor dietary choices and obesity can cause diabetes, heart attack, stroke, and kidney damage, among others.    ALCOHOL AND SMOKING:  Women should limit their alcohol intake to no more than 7 drinks/beers/glasses of wine (combined, not each!) per week. Moderation of alcohol intake to this level decreases your risk of breast cancer and liver damage. And of course, no recreational drugs are part of a healthy lifestyle.  And absolutely no smoking or even second hand smoke. Most people know smoking can cause heart and lung diseases, but did you know it also contributes to weakening of your bones? Aging of your skin?  Yellowing of your teeth and nails?  CALCIUM AND VITAMIN D:  Adequate intake of calcium and Vitamin D are recommended.  The recommendations for exact amounts of these supplements seem to change often, but generally speaking 600 mg of calcium (either carbonate or citrate) and 800 units of Vitamin D per day seems prudent. Certain women may benefit from higher intake of Vitamin D.  If you are among these women, your doctor will have told you during your visit.    PAP SMEARS:  Pap smears, to check for cervical cancer or precancers,  have traditionally been done yearly, although recent scientific advances have shown that most women can have pap smears less often.  However, every woman still should have a physical exam from her gynecologist every year. It will include a breast check, inspection of the vulva and vagina to check for abnormal growths or skin changes, a visual exam of the cervix, and then an exam to evaluate the size and shape of the uterus and  ovaries.  And after 55 years of age, a rectal exam is indicated to check for rectal cancers. We will also provide age appropriate advice regarding health maintenance, like when you should have certain vaccines, screening for sexually transmitted diseases, bone density testing, colonoscopy, mammograms, etc.   MAMMOGRAMS:  All women over 40 years old should have a yearly mammogram. Many facilities now offer a "3D" mammogram, which may cost around $50 extra out of pocket. If possible,  we recommend you accept the option to have the 3D mammogram performed.  It both reduces the number of women who will be called back for extra views which then turn out to be normal, and it is better than the routine mammogram at detecting truly abnormal areas.    COLONOSCOPY:  Colonoscopy to screen for colon cancer is recommended for all women at age 50.  We know, you hate the idea of the prep.  We agree, BUT, having colon cancer and not knowing it is worse!!  Colon cancer so often starts as a polyp that can be seen and removed at colonscopy, which can quite literally save your life!  And if your first colonoscopy is normal and you have no family history of colon cancer, most women don't have to have it again for 10 years.  Once every ten years, you can do something that may end up saving your life, right?  We will be happy to help you get it scheduled when you are ready.    Be sure to check your insurance coverage so you understand how much it will cost.  It may be covered as a preventative service at no cost, but you should check your particular policy.      Breast Self-Awareness Breast self-awareness means being familiar with how your breasts look and feel. It involves checking your breasts regularly and reporting any changes to your health care provider. Practicing breast self-awareness is important. A change in your breasts can be a sign of a serious medical problem. Being familiar with how your breasts look and feel allows  you to find any problems early, when treatment is more likely to be successful. All women should practice breast self-awareness, including women who have had breast implants. How to do a breast self-exam One way to learn what is normal for your breasts and whether your breasts are changing is to do a breast self-exam. To do a breast self-exam: Look for Changes  1. Remove all the clothing above your waist. 2. Stand in front of a mirror in a room with good lighting. 3. Put your hands on your hips. 4. Push your hands firmly downward. 5. Compare your breasts in the mirror. Look for differences between them (asymmetry), such as: ? Differences in shape. ? Differences in size. ? Puckers, dips, and bumps in one breast and not the other. 6. Look at each breast for changes in your skin, such as: ? Redness. ? Scaly areas. 7. Look for changes in your nipples, such as: ? Discharge. ? Bleeding. ? Dimpling. ? Redness. ? A change in position. Feel for Changes  Carefully feel your breasts for lumps and changes. It is best to do this while lying on your back on the floor and again while sitting or standing in the shower or tub with soapy water on your skin. Feel each breast in the following way:  Place the arm on the side of the breast you are examining above your head.  Feel your breast with the other hand.  Start in the nipple area and make  inch (2 cm) overlapping circles to feel your breast. Use the pads of your three middle fingers to do this. Apply light pressure, then medium pressure, then firm pressure. The light pressure will allow you to feel the tissue closest to the skin. The medium pressure will allow you to feel the tissue that is a little deeper. The firm pressure will allow you to feel the tissue close to the ribs.  Continue the overlapping circles, moving downward over the breast until you feel your ribs below your breast.  Move one finger-width toward the center of the body.  Continue to use the  inch (2 cm) overlapping circles to feel your breast as you move slowly up toward your collarbone.  Continue the up and down exam using all three pressures until you reach your armpit.  Write Down What You Find  Write down what is normal for each breast and any changes that you find. Keep a written record with breast changes or normal findings for each breast. By writing this information down, you do not need to depend only on memory for size, tenderness, or location. Write down where you are in your menstrual cycle, if you are still menstruating. If you are having trouble noticing differences in your breasts, do not get discouraged. With time you will become more familiar with the variations in your breasts and more comfortable with the exam. How often should I examine my breasts? Examine   your breasts every month. If you are breastfeeding, the best time to examine your breasts is after a feeding or after using a breast pump. If you menstruate, the best time to examine your breasts is 5-7 days after your period is over. During your period, your breasts are lumpier, and it may be more difficult to notice changes. When should I see my health care provider? See your health care provider if you notice:  A change in shape or size of your breasts or nipples.  A change in the skin of your breast or nipples, such as a reddened or scaly area.  Unusual discharge from your nipples.  A lump or thick area that was not there before.  Pain in your breasts.  Anything that concerns you.  This information is not intended to replace advice given to you by your health care provider. Make sure you discuss any questions you have with your health care provider. Document Released: 06/13/2005 Document Revised: 11/19/2015 Document Reviewed: 05/03/2015 Elsevier Interactive Patient Education  2018 Elsevier Inc.  

## 2017-04-13 NOTE — Progress Notes (Signed)
56 y.o. G1P1001 DivorcedAfrican AmericanF here for annual exam.  S/P Hysterectomy in 2014 for fibroids. On HRT. She was seen last month with worsening vasomotor symptoms. Normal TSH last month. Her vasomotor symptoms have been better since her last visit.  H/O bad constipation, has gone 3 weeks without a BM. She has seen Dr Collene Mares and was given Linzess, also on Miralax. On those medication she gets worsening IBS pain, still only having a BM 1 x a week. She is increasing her water intake. Her constipation got a lot worse last summer.  She has an autoimmune disorder and chronic pain in her lower abdomen down to her feet.  She had a normal CT of her abd/pelvis at [facility] on 01/19/17.  Dad just had surgery for volvulus, he has Parkinson. Getting PT at home (with her).     Patient's last menstrual period was 10/25/2012 (approximate).          Sexually active: No.  The current method of family planning is status post hysterectomy.    Exercising: Yes.    walking  Smoker:  no  Health Maintenance: Pap:  unsure History of abnormal Pap:  Yes - 1988, s/p cryo. MMG:  01-28-16 WNL  Colonoscopy:  09-24-10 WNL, Normal colonoscopy in 8/17. Doing them every 5 years.  BMD:  12-31-14 low bone mass  TDaP:  02-04-12 Gardasil: N/A   reports that she has never smoked. She has never used smokeless tobacco. 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 Cares for her elderly parents.  Daughter is 75, currently living locally and getting her masters in sociology. Her daughter works with people with disabilities  Past Medical History:  Diagnosis Date  . Abnormal Pap smear 1988   treated with cryo  . Anemia 11/2010   hematology consult prior; etiology malabsorption and uterine bleeding  . Anxiety   . Gastric polyp   . GERD (gastroesophageal reflux disease)   . H/O bone density study 12/2007  . H/O hysterectomy for benign disease 5/14  . History of echocardiogram 03/2010   normal LV function, EF 60-65%, mild left atrial enlargement  . HTN (hypertension) 03/2010   hospitalization for HTN urgency  . Hyperlipemia   . Internal hemorrhoids   . Lumbar degenerative disc disease   . Migraine   . Myalgia   . Spondylosis, cervical   . Umbilical hernia   . Uterine fibroid    hx/o     Past Surgical History:  Procedure Laterality Date  . CERVICAL FUSION  2010  . CERVIX LESION DESTRUCTION  1988  . CESAREAN SECTION    . CHOLECYSTECTOMY  2003  . COLONOSCOPY  08/2010   Dr. Collene Mares  . ESOPHAGOGASTRODUODENOSCOPY  08/2010   Dr. Collene Mares  . EXPLORATORY LAPAROTOMY    . HERNIA REPAIR    . MYOMECTOMY  1998  . PELVIC LAPAROSCOPY    . ROBOTIC ASSISTED LAPAROSCOPIC HYSTERECTOMY AND SALPINGECTOMY  10/2012   [facility]; laproscopic due to fibroids  . UMBILICAL HERNIA REPAIR  10/2013   infected laparoscopic port, mesh placement    Current Outpatient Prescriptions  Medication Sig Dispense Refill  . acetaminophen (TYLENOL) 500 MG tablet Take 1 tablet (500 mg total) by mouth every 6 (six) hours as needed. 30 tablet 0  . amLODipine (NORVASC) 5 MG tablet TAKE 1 TABLET BY MOUTH EVERY DAY 90 tablet 1  . Ascorbic Acid (VITAMIN C) 1000 MG tablet Take 1,000 mg by mouth daily.     Marland Kitchen  atorvastatin (LIPITOR) 20 MG tablet TAKE 1 TABLET (20 MG TOTAL) BY MOUTH DAILY. 90 tablet 0  . B Complex Vitamins (B COMPLEX PO) Take by mouth.    . calcium carbonate (OS-CAL) 600 MG TABS Take 600 mg by mouth daily.     . CVS ASPIRIN LOW DOSE 81 MG EC tablet TAKE 1 TABLET (81 MG TOTAL) BY MOUTH DAILY. 90 tablet 2  . estradiol (VIVELLE-DOT) 0.1 MG/24HR patch Place 1 patch (0.1 mg total) onto the skin 2 (two) times a week. 12 patch 4  . fish oil-omega-3 fatty acids 1000 MG capsule Take 2 g by mouth daily.    . Ginger, Zingiber officinalis, (GINGER ROOT PO) Take by mouth.    . hydroxychloroquine (PLAQUENIL) 200 MG tablet Take 1 tablet (200 mg total) by mouth 2 (two) times daily. Monday through Friday 40 tablet 2  .  linaclotide (LINZESS) 72 MCG capsule Take 1 capsule (72 mcg total) by mouth daily before breakfast. 15 capsule 0  . Misc Natural Products (TART CHERRY ADVANCED PO) Take by mouth.    . Multiple Vitamin (MULTIVITAMIN) tablet Take 1 tablet by mouth daily.     Marland Kitchen NEXIUM 40 MG capsule Take 1 capsule by mouth daily. Reported on 12/02/2015    . OLIVE LEAF PO Take by mouth.    . Probiotic Product (PROBIOTIC ADVANCED PO) Take by mouth.    . TURMERIC PO Take by mouth.     No current facility-administered medications for this visit.     Family History  Problem Relation Age of Onset  . Hypertension Mother   . Dementia Mother   . Colon cancer Mother 54  . Deep vein thrombosis Father   . Hypertension Father   . Cancer Father        prostate CA  . Heart disease Father 66       CAD  . Hypertension Sister   . Hyperlipidemia Sister   . Hypertension Brother   . Hyperlipidemia Brother   . Hypertension Maternal Aunt   . Hyperlipidemia Maternal Aunt   . Rheum arthritis Maternal Aunt   . Diabetes Maternal Aunt        1 with type II, 1 with type 1  . Hypertension Brother   . Rheum arthritis Brother   . Diabetes Paternal Aunt        type II    Review of Systems  Constitutional: Negative.   HENT: Negative.   Eyes: Negative.   Respiratory: Negative.   Cardiovascular: Negative.   Gastrointestinal: Positive for constipation.  Endocrine: Positive for cold intolerance.  Genitourinary: Negative.   Musculoskeletal: Negative.   Skin: Negative.   Allergic/Immunologic: Negative.   Neurological: Negative.   Psychiatric/Behavioral: Negative.     Exam:   BP 118/70 (BP Location: Right Arm, Patient Position: Sitting, Cuff Size: Normal)   Pulse 60   Resp 14   Ht 5\' 4"  (1.626 m)   Wt 124 lb (56.2 kg)   LMP 10/25/2012 (Approximate)   BMI 21.28 kg/m   Weight change: @WEIGHTCHANGE @ Height:   Height: 5\' 4"  (162.6 cm)  Ht Readings from Last 3 Encounters:  04/13/17 5\' 4"  (1.626 m)  04/11/17 5\' 4"  (1.626  m)  01/04/17 5\' 4"  (1.626 m)    General appearance: alert, cooperative and appears stated age Head: Normocephalic, without obvious abnormality, atraumatic Neck: no adenopathy, supple, symmetrical, trachea midline and thyroid normal to inspection and palpation Lungs: clear to auscultation bilaterally Cardiovascular: regular rate and rhythm Breasts: normal appearance,  no masses or tenderness Abdomen: soft, non-tender; non distended,  no masses,  no organomegaly Extremities: extremities normal, atraumatic, no cyanosis or edema Skin: Skin color, texture, turgor normal. No rashes or lesions Lymph nodes: Cervical, supraclavicular, and axillary nodes normal. No abnormal inguinal nodes palpated Neurologic: Grossly normal   Pelvic: External genitalia:  no lesions              Urethra:  normal appearing urethra with no masses, tenderness or lesions              Bartholins and Skenes: normal                 Vagina: normal appearing vagina with normal color and discharge, no lesions              Cervix: absent               Bimanual Exam:  Uterus:  uterus absent              Adnexa: no mass, fullness, tenderness               Rectovaginal: Confirms               Anus:  normal sphincter tone, no lesions  Chaperone was present for exam.  A:  Well Woman with normal exam  On HRT, willing to try the next lower dose  Constipation, followed by Dr Collene Mares  H/O cryosurgery of her cervix years ago  P:   Over due for her mammogram (she will get this w/in the next month)  Try lower dose of HRT   F/u in one month  Will hold on a f/u DEXA while on ERT at least until 2021  Discussed breast self exam  Discussed calcium and vit D intake  Labs with her primary  Pap with hpv from her vaginal apex

## 2017-04-18 ENCOUNTER — Other Ambulatory Visit: Payer: Self-pay | Admitting: Rheumatology

## 2017-04-19 NOTE — Telephone Encounter (Signed)
Last Visit: 04/11/17 Next Visit due in February 2019. Message sent to the front to schedule patient. Labs: 04/11/17 WNL PLQ Eye Exam: 12/06/16 normal  Okay to refill per Dr. Estanislado Pandy

## 2017-04-20 ENCOUNTER — Other Ambulatory Visit (HOSPITAL_COMMUNITY)
Admission: RE | Admit: 2017-04-20 | Discharge: 2017-04-20 | Disposition: A | Discharge status: Home / Self Care | Payer: BC Managed Care – PPO | Source: Ambulatory Visit | Attending: Obstetrics and Gynecology | Admitting: Obstetrics and Gynecology

## 2017-04-20 DIAGNOSIS — Z1272 Encounter for screening for malignant neoplasm of vagina: Secondary | ICD-10-CM | POA: Insufficient documentation

## 2017-04-20 NOTE — Addendum Note (Signed)
Addended by: Dorothy Spark on: 04/20/2017 05:40 PM   Modules accepted: Orders

## 2017-04-25 LAB — CYTOLOGY - PAP
Diagnosis: NEGATIVE
HPV: NOT DETECTED

## 2017-05-04 ENCOUNTER — Ambulatory Visit
Admission: RE | Admit: 2017-05-04 | Discharge: 2017-05-04 | Disposition: A | Discharge status: Home / Self Care | Payer: BC Managed Care – PPO | Source: Ambulatory Visit | Attending: Obstetrics and Gynecology | Admitting: Obstetrics and Gynecology

## 2017-05-04 DIAGNOSIS — Z1231 Encounter for screening mammogram for malignant neoplasm of breast: Secondary | ICD-10-CM

## 2017-05-11 ENCOUNTER — Ambulatory Visit: Payer: BC Managed Care – PPO | Admitting: Obstetrics and Gynecology

## 2017-05-11 ENCOUNTER — Telehealth: Payer: Self-pay | Admitting: Obstetrics and Gynecology

## 2017-05-11 NOTE — Telephone Encounter (Signed)
Patient missed her 4 week recheck appointment today. Patient rescheduled to 05/30/17.

## 2017-05-17 ENCOUNTER — Encounter: Payer: Self-pay | Admitting: Medical

## 2017-05-17 ENCOUNTER — Ambulatory Visit: Payer: BC Managed Care – PPO | Admitting: Medical

## 2017-05-17 VITALS — BP 112/60 | HR 57 | Temp 98.1°F | Resp 18 | Wt 126.0 lb

## 2017-05-17 DIAGNOSIS — Z23 Encounter for immunization: Secondary | ICD-10-CM

## 2017-05-17 DIAGNOSIS — M791 Myalgia, unspecified site: Secondary | ICD-10-CM | POA: Diagnosis not present

## 2017-05-17 DIAGNOSIS — D8989 Other specified disorders involving the immune mechanism, not elsewhere classified: Secondary | ICD-10-CM

## 2017-05-17 DIAGNOSIS — M359 Systemic involvement of connective tissue, unspecified: Secondary | ICD-10-CM

## 2017-05-17 NOTE — Progress Notes (Signed)
Subjective: Chief Complaint  Patient presents with  . Lump in thigh    lumps in legs.     Here for some lumps in legs that she just noticed recently.  Feels lumpy area in left leg above knee, above left medial ankle, right knee region. No other new mass.  No new weakness, numbness, tingling.   Has pain all the time.  On plaquenil for autoimmune disease, suspected lupus.  No other new symptom  Here for flu shot  She wanted to review scans she had at ED visit [facility] in the summer.   Past Medical History:  Diagnosis Date  . Abnormal Pap smear 1988   treated with cryo  . Anemia 11/2010   hematology consult prior; etiology malabsorption and uterine bleeding  . Anxiety   . Gastric polyp   . GERD (gastroesophageal reflux disease)   . H/O bone density study 12/2007  . H/O hysterectomy for benign disease 5/14  . History of echocardiogram 03/2010   normal LV function, EF 60-65%, mild left atrial enlargement  . HTN (hypertension) 03/2010   hospitalization for HTN urgency  . Hyperlipemia   . Internal hemorrhoids   . Lumbar degenerative disc disease   . Migraine   . Myalgia   . Spondylosis, cervical   . Umbilical hernia   . Uterine fibroid    hx/o    Current Outpatient Medications on File Prior to Visit  Medication Sig Dispense Refill  . acetaminophen (TYLENOL) 500 MG tablet Take 1 tablet (500 mg total) by mouth every 6 (six) hours as needed. 30 tablet 0  . amLODipine (NORVASC) 5 MG tablet TAKE 1 TABLET BY MOUTH EVERY DAY 90 tablet 1  . Ascorbic Acid (VITAMIN C) 1000 MG tablet Take 1,000 mg by mouth daily.     Marland Kitchen atorvastatin (LIPITOR) 20 MG tablet TAKE 1 TABLET (20 MG TOTAL) BY MOUTH DAILY. 90 tablet 0  . B Complex Vitamins (B COMPLEX PO) Take by mouth.    . calcium carbonate (OS-CAL) 600 MG TABS Take 600 mg by mouth daily.     . CVS ASPIRIN LOW DOSE 81 MG EC tablet TAKE 1 TABLET (81 MG TOTAL) BY MOUTH DAILY. 90 tablet 2  . estradiol (VIVELLE-DOT) 0.075 MG/24HR Place 1 patch  onto the skin 2 (two) times a week. 8 patch 1  . fish oil-omega-3 fatty acids 1000 MG capsule Take 2 g by mouth daily.    . Ginger, Zingiber officinalis, (GINGER ROOT PO) Take by mouth.    . hydroxychloroquine (PLAQUENIL) 200 MG tablet TAKE 1 TABLET (200 MG TOTAL) BY MOUTH 2 (TWO) TIMES DAILY. MONDAY THROUGH FRIDAY 40 tablet 2  . Misc Natural Products (TART CHERRY ADVANCED PO) Take by mouth.    . Multiple Vitamin (MULTIVITAMIN) tablet Take 1 tablet by mouth daily.     Marland Kitchen NEXIUM 40 MG capsule Take 1 capsule by mouth daily. Reported on 12/02/2015    . Probiotic Product (PROBIOTIC ADVANCED PO) Take by mouth.    . TURMERIC PO Take by mouth.    . linaclotide (LINZESS) 72 MCG capsule Take 1 capsule (72 mcg total) by mouth daily before breakfast. (Patient not taking: Reported on 05/17/2017) 15 capsule 0  . OLIVE LEAF PO Take by mouth.     No current facility-administered medications on file prior to visit.    ROS as in subjective    Objective: BP 112/60   Pulse (!) 57   Temp 98.1 F (36.7 C) (Oral)   Resp  18   Wt 126 lb (57.2 kg)   LMP 10/25/2012 (Approximate)   SpO2 99%   BMI 21.63 kg/m   Gen: wd, wn, nad Faint subtle fullness over left leg just above knee, but no definite mass of knees or ankles as she was suggesting.   There is faint small bruising of left anterior lower leg and left antecubital region.  No other obvious mass Psych pleasant, answers questions appropriately    Assessment: Encounter Diagnoses  Name Primary?  . Autoimmune disease ([place]) Yes  . Myalgia   . Need for influenza vaccination     Plan: Autoimmune disease, myalgia - discussed that lupus can be systemic affecting numerous systems in the body but varies in severity from one person to the next.  There is no obvious mass today.  Discussed symptoms, signs that would prompt recheck.  Advised c/t f/u with rheumatology  Counseled on the influenza virus vaccine.  Vaccine information sheet given.  Influenza vaccine  given after consent obtained.  I reviewed the chest abdomen and pelvic CT reports from 12/2016 [facility] ED visit.  No worrisome mass or other findings.  reassured  Rubbie was seen today for lump in thigh.  Diagnoses and all orders for this visit:  Autoimmune disease ([place])  Myalgia  Need for influenza vaccination -     Flu Vaccine QUAD 6+ mos PF IM (Fluarix Quad PF)

## 2017-05-29 ENCOUNTER — Other Ambulatory Visit: Payer: Self-pay | Admitting: Medical

## 2017-05-30 ENCOUNTER — Other Ambulatory Visit: Payer: Self-pay

## 2017-05-30 ENCOUNTER — Encounter: Payer: Self-pay | Admitting: Obstetrics and Gynecology

## 2017-05-30 ENCOUNTER — Ambulatory Visit (INDEPENDENT_AMBULATORY_CARE_PROVIDER_SITE_OTHER): Payer: BC Managed Care – PPO | Admitting: Obstetrics and Gynecology

## 2017-05-30 VITALS — BP 120/70 | HR 76 | Resp 16 | Wt 126.0 lb

## 2017-05-30 DIAGNOSIS — Z5181 Encounter for therapeutic drug level monitoring: Secondary | ICD-10-CM | POA: Diagnosis not present

## 2017-05-30 DIAGNOSIS — R2242 Localized swelling, mass and lump, left lower limb: Secondary | ICD-10-CM

## 2017-05-30 DIAGNOSIS — Z79890 Hormone replacement therapy (postmenopausal): Secondary | ICD-10-CM | POA: Diagnosis not present

## 2017-05-30 MED ORDER — ESTRADIOL 0.05 MG/24HR TD PTTW: 1.0000 | MEDICATED_PATCH | TRANSDERMAL | 1 refills | Status: DC

## 2017-05-30 NOTE — Progress Notes (Signed)
GYNECOLOGY  VISIT   HPI: 56 y.o.   Divorced  [place] American  female   [place] with Patient's last menstrual period was 10/25/2012 (approximate).   here for follow up HRT. At her annual exam her ERT was decreased from the 0.1 mg patch to the 0.75 mg patch. She is feeling fine on the lower dose patch. Willing to go down again on the medication.  She has a h/o a hysterectomy.  Patient has a knot on left leg she is concerned about. She noticed about 3 weeks ago, just a lump just above her knee on the left. Not tender, no trauma, no bruising. No calf pain.   GYNECOLOGIC HISTORY: Patient's last menstrual period was 10/25/2012 (approximate). Contraception:hysterectomy  Menopausal hormone therapy: Estradiol         OB History    Gravida Para Term Preterm AB Living   1 1 1  0 0 1   SAB TAB Ectopic Multiple Live Births   0 0 0 0 1         Patient Active Problem List   Diagnosis Date Noted  . High risk medication use 03/30/2017  . Chronic constipation 11/17/2016  . Globus sensation 11/17/2016  . Primary osteoarthritis of both knees 10/05/2016  . Autoimmune disease ([place]) 09/29/2016  . Primary osteoarthritis of both hands 09/29/2016  . Family history of lupus erythematosus 09/20/2016  . Polyarthralgia 05/06/2016  . Clenching of teeth 05/06/2016  . Finger swelling 05/06/2016  . Morning stiffness of joints 05/06/2016  . Myalgia 05/06/2016  . Family history of rheumatoid arthritis 05/06/2016  . Essential hypertension 12/23/2014  . Hyperlipidemia 12/23/2014  . History of anemia 12/23/2014  . Care provider for parents 12/23/2014  . Impaired fasting glucose 10/25/2011  . Gluten intolerance 10/25/2011  . Gastroesophageal reflux disease without esophagitis 10/25/2011  . Anxiety state   . Iron deficiency anemia     Past Medical History:  Diagnosis Date  . Abnormal Pap smear 1988   treated with cryo  . Anemia 11/2010   hematology consult prior; etiology malabsorption and uterine  bleeding  . Anxiety   . Gastric polyp   . GERD (gastroesophageal reflux disease)   . H/O bone density study 12/2007  . H/O hysterectomy for benign disease 5/14  . History of echocardiogram 03/2010   normal LV function, EF 60-65%, mild left atrial enlargement  . HTN (hypertension) 03/2010   hospitalization for HTN urgency  . Hyperlipemia   . Internal hemorrhoids   . Lumbar degenerative disc disease   . Migraine   . Myalgia   . Spondylosis, cervical   . Umbilical hernia   . Uterine fibroid    hx/o     Past Surgical History:  Procedure Laterality Date  . CERVICAL FUSION  2010  . CERVIX LESION DESTRUCTION  1988  . CESAREAN SECTION    . CHOLECYSTECTOMY  2003  . COLONOSCOPY  08/2010   Dr. Collene Mares  . ESOPHAGOGASTRODUODENOSCOPY  08/2010   Dr. Collene Mares  . EXPLORATORY LAPAROTOMY    . HERNIA REPAIR    . MYOMECTOMY  1998  . PELVIC LAPAROSCOPY    . ROBOTIC ASSISTED LAPAROSCOPIC HYSTERECTOMY AND SALPINGECTOMY  10/2012   [facility]; laproscopic due to fibroids  . UMBILICAL HERNIA REPAIR  10/2013   infected laparoscopic port, mesh placement    Current Outpatient Medications  Medication Sig Dispense Refill  . acetaminophen (TYLENOL) 500 MG tablet Take 1 tablet (500 mg total) by mouth every 6 (six) hours as needed. 30 tablet 0  .  amLODipine (NORVASC) 5 MG tablet TAKE 1 TABLET BY MOUTH EVERY DAY 90 tablet 1  . Ascorbic Acid (VITAMIN C) 1000 MG tablet Take 1,000 mg by mouth daily.     Marland Kitchen atorvastatin (LIPITOR) 20 MG tablet TAKE 1 TABLET (20 MG TOTAL) BY MOUTH DAILY. 90 tablet 0  . B Complex Vitamins (B COMPLEX PO) Take by mouth.    . calcium carbonate (OS-CAL) 600 MG TABS Take 600 mg by mouth daily.     . [facility] ASPIRIN LOW DOSE 81 MG EC tablet TAKE 1 TABLET (81 MG TOTAL) BY MOUTH DAILY. 90 tablet 2  . estradiol (VIVELLE-DOT) 0.075 MG/24HR Place 1 patch onto the skin 2 (two) times a week. 8 patch 1  . fish oil-omega-3 fatty acids 1000 MG capsule Take 2 g by mouth daily.    . Ginger, Zingiber officinalis,  (GINGER ROOT PO) Take by mouth.    . hydroxychloroquine (PLAQUENIL) 200 MG tablet TAKE 1 TABLET (200 MG TOTAL) BY MOUTH 2 (TWO) TIMES DAILY. MONDAY THROUGH FRIDAY 40 tablet 2  . linaclotide (LINZESS) 72 MCG capsule Take 1 capsule (72 mcg total) by mouth daily before breakfast. 15 capsule 0  . Misc Natural Products (TART CHERRY ADVANCED PO) Take by mouth.    . Multiple Vitamin (MULTIVITAMIN) tablet Take 1 tablet by mouth daily.     Marland Kitchen NEXIUM 40 MG capsule Take 1 capsule by mouth daily. Reported on 12/02/2015    . OLIVE LEAF PO Take by mouth.    . Probiotic Product (PROBIOTIC ADVANCED PO) Take by mouth.    . TURMERIC PO Take by mouth.     No current facility-administered medications for this visit.      ALLERGIES: Quinolones; Latex; Penicillins; Percocet [oxycodone-acetaminophen]; Sulfa drugs cross reactors; Vicodin [hydrocodone-acetaminophen]; and Gluten meal  Family History  Problem Relation Age of Onset  . Hypertension Mother   . Dementia Mother   . Colon cancer Mother 68  . Deep vein thrombosis Father   . Hypertension Father   . Cancer Father        prostate CA  . Heart disease Father 70       CAD  . Hypertension Sister   . Hyperlipidemia Sister   . Hypertension Brother   . Hyperlipidemia Brother   . Hypertension Maternal Aunt   . Hyperlipidemia Maternal Aunt   . Rheum arthritis Maternal Aunt   . Diabetes Maternal Aunt        1 with type II, 1 with type 1  . Hypertension Brother   . Rheum arthritis Brother   . Diabetes Paternal Aunt        type II    Social History   Socioeconomic History  . Marital status: Divorced    Spouse name: Not on file  . Number of children: 1  . Years of education: Not on file  . Highest education level: Not on file  Social Needs  . Financial resource strain: Not on file  . Food insecurity - worry: Not on file  . Food insecurity - inability: Not on file  . Transportation needs - medical: Not on file  . Transportation needs - non-medical:  Not on file  Occupational History    Employer: [employer]    Comment: molecular Production designer, theatre/television/film, PhD candidate, [employer]  Tobacco Use  . Smoking status: Never Smoker  . Smokeless tobacco: Never Used  Substance and Sexual Activity  . Alcohol use: No  . Drug use: No  . Sexual  activity: No    Partners: Male    Birth control/protection: Surgical  Other Topics Concern  . 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  Has 30 yo daughter.  Her siblings and her don't agree on helping take care of their parents, causes family tension.  As of 11/2014    Review of Systems  Constitutional: Negative.   HENT: Negative.   Eyes: Negative.   Respiratory: Negative.   Cardiovascular: Negative.   Gastrointestinal: Negative.   Genitourinary: Negative.   Musculoskeletal: Negative.   Skin:       Knot left leg   Neurological: Negative.   Endo/Heme/Allergies: Negative.   Psychiatric/Behavioral: Negative.     PHYSICAL EXAMINATION:    BP 120/70 (BP Location: Right Arm, Patient Position: Sitting, Cuff Size: Normal)   Pulse 76   Resp 16   Wt 126 lb (57.2 kg)   LMP 10/25/2012 (Approximate)   BMI 21.63 kg/m     General appearance: alert, cooperative and appears stated age Lower extremity: she has a non-tender lump in her left thigh, just above her knee laterally. It is subtle, ~2 cm x 1 cm, ? Lipoma. No calf tenderness, negative homans sign   ASSESSMENT ERT. She has been fine on the lower dose patch (and has only been changing it one x a week, didn't realize it was to be changed 2 x a week) Willing to decrease the dose Lump on right thigh, subtle, ? Lipoma. No signs of clotting    PLAN Decrease to the vivelle dot, 0.05 mg (she will continue to try and  change it weekly) She will call in the next month or so, if doing okay we will decrease the dose to 0.0375 mg No progesterone needed Again we discussed the risks of ERT, including blood clots, PE, stroke, MI and breast cancer She has some autoimmune process, we discussed that if it effects the vascular system it could further increase her risk of clotting  Recommended she f/u with her primary for evaluation of the lump in her leg.    An After Visit Summary was printed and given to the patient.  ~15 minutes face to face time of which over 50% was spent in counseling.

## 2017-06-14 ENCOUNTER — Telehealth: Payer: Self-pay | Admitting: Medical

## 2017-06-14 NOTE — Telephone Encounter (Signed)
  She was seen for lump on leg. It hasn't changed and now has some pain from it. She wants to know if she needs to be referred out or waht she needs to do  Please call

## 2017-06-15 ENCOUNTER — Other Ambulatory Visit: Payer: Self-pay | Admitting: Medical

## 2017-06-15 ENCOUNTER — Telehealth: Payer: Self-pay | Admitting: Rheumatology

## 2017-06-15 DIAGNOSIS — R2242 Localized swelling, mass and lump, left lower limb: Secondary | ICD-10-CM

## 2017-06-15 DIAGNOSIS — M79605 Pain in left leg: Secondary | ICD-10-CM

## 2017-06-15 NOTE — Telephone Encounter (Signed)
Ok, have her go for xray first

## 2017-06-15 NOTE — Telephone Encounter (Signed)
Called [facility] to get the ct scan approved and itneeds  A peer to peer [phone] for ct scan of femur  , ref # is [id number].  Per [facility] it didn't meet medical necessity

## 2017-06-15 NOTE — Telephone Encounter (Signed)
Attempted to contact the patient and left message for patient to call the office.  

## 2017-06-15 NOTE — Telephone Encounter (Signed)
Patient has developed lumps under skin on lt leg that started 3-4 weeks ago. Lumps now becoming painful. Patient would like to discuss this. Please call to advise.

## 2017-06-15 NOTE — Telephone Encounter (Signed)
Please set her up for CT scan of left thigh.  Make sure the radiologist knows that she has a fullness or possible lump just above the left knee, so I assume this is the best test for this

## 2017-06-16 NOTE — Telephone Encounter (Signed)
Called [facility] to cancel ct scan , rebecca will call the pt to inform of this and notify to of the x-ray.

## 2017-06-21 ENCOUNTER — Ambulatory Visit
Admission: RE | Admit: 2017-06-21 | Discharge: 2017-06-21 | Disposition: A | Discharge status: Home / Self Care | Payer: BC Managed Care – PPO | Source: Ambulatory Visit | Attending: Gastroenterology | Admitting: Gastroenterology

## 2017-06-21 ENCOUNTER — Other Ambulatory Visit: Payer: BC Managed Care – PPO

## 2017-06-21 ENCOUNTER — Ambulatory Visit
Admission: RE | Admit: 2017-06-21 | Discharge: 2017-06-21 | Disposition: A | Discharge status: Home / Self Care | Payer: BC Managed Care – PPO | Source: Ambulatory Visit | Attending: Medical | Admitting: Medical

## 2017-06-21 ENCOUNTER — Other Ambulatory Visit: Payer: Self-pay | Admitting: Gastroenterology

## 2017-06-21 DIAGNOSIS — R2242 Localized swelling, mass and lump, left lower limb: Secondary | ICD-10-CM

## 2017-06-21 DIAGNOSIS — R1310 Dysphagia, unspecified: Secondary | ICD-10-CM

## 2017-06-21 DIAGNOSIS — M79605 Pain in left leg: Secondary | ICD-10-CM

## 2017-06-23 ENCOUNTER — Encounter: Payer: Self-pay | Admitting: Medical

## 2017-06-23 ENCOUNTER — Other Ambulatory Visit: Payer: Self-pay | Admitting: Medical

## 2017-06-23 DIAGNOSIS — M79605 Pain in left leg: Secondary | ICD-10-CM

## 2017-06-26 ENCOUNTER — Emergency Department (HOSPITAL_BASED_OUTPATIENT_CLINIC_OR_DEPARTMENT_OTHER)
Admission: EM | Admit: 2017-06-26 | Discharge: 2017-06-26 | Disposition: A | Discharge status: Home / Self Care | Payer: BC Managed Care – PPO | Attending: Physician Assistant | Admitting: Physician Assistant

## 2017-06-26 ENCOUNTER — Encounter (HOSPITAL_BASED_OUTPATIENT_CLINIC_OR_DEPARTMENT_OTHER): Payer: Self-pay | Admitting: *Deleted

## 2017-06-26 ENCOUNTER — Emergency Department (HOSPITAL_BASED_OUTPATIENT_CLINIC_OR_DEPARTMENT_OTHER): Payer: BC Managed Care – PPO

## 2017-06-26 ENCOUNTER — Other Ambulatory Visit: Payer: Self-pay

## 2017-06-26 DIAGNOSIS — M79605 Pain in left leg: Secondary | ICD-10-CM | POA: Diagnosis not present

## 2017-06-26 DIAGNOSIS — Z91040 Latex allergy status: Secondary | ICD-10-CM | POA: Diagnosis not present

## 2017-06-26 DIAGNOSIS — Z79899 Other long term (current) drug therapy: Secondary | ICD-10-CM | POA: Insufficient documentation

## 2017-06-26 DIAGNOSIS — I10 Essential (primary) hypertension: Secondary | ICD-10-CM | POA: Diagnosis not present

## 2017-06-26 LAB — URINALYSIS, ROUTINE W REFLEX MICROSCOPIC
Bilirubin Urine: NEGATIVE
Glucose, UA: NEGATIVE mg/dL
Ketones, ur: NEGATIVE mg/dL
Leukocytes, UA: NEGATIVE
Nitrite: NEGATIVE
Protein, ur: NEGATIVE mg/dL
Specific Gravity, Urine: 1.025 (ref 1.005–1.030)
pH: 6.5 (ref 5.0–8.0)

## 2017-06-26 LAB — URINALYSIS, MICROSCOPIC (REFLEX)

## 2017-06-26 MED ORDER — DICLOFENAC SODIUM 1 % TD GEL: 4.0000 g | Freq: Four times a day (QID) | TRANSDERMAL | 0 refills | Status: DC

## 2017-06-26 NOTE — ED Notes (Signed)
Pt returned from US

## 2017-06-26 NOTE — ED Triage Notes (Signed)
Left leg pain. Calf feels tight. She had an xray of her leg last week for swelling in her leg.

## 2017-06-26 NOTE — Discharge Instructions (Addendum)
There were no signs of blood clot in the leg.  Antiinflammatory medications: Take 600 mg of ibuprofen every 6 hours or 440 mg (over the counter dose) to 500 mg (prescription dose) of naproxen every 12 hours for the next 3 days. After this time, these medications may be used as needed for pain. Take these medications with food to avoid upset stomach. Choose only one of these medications, do not take them together. Tylenol: Should you continue to have additional pain while taking the ibuprofen or naproxen, you may add in tylenol as needed. Your daily total maximum amount of tylenol from all sources should be limited to 4000mg /day for persons without liver problems, or 2000mg /day for those with liver problems. Diclofenac gel: As an alternative to the ibuprofen or naproxen, you may apply the diclofenac gel directly to the painful area.  Follow-up with your orthopedic specialist as soon as possible on this matter.

## 2017-06-26 NOTE — ED Provider Notes (Signed)
[facility] EMERGENCY DEPARTMENT Provider Note   CSN: [id number] Arrival date & time: 06/26/17  1719     History   Chief Complaint Chief Complaint  Patient presents with  . Leg Pain    HPI Shari Lawrence is a 56 y.o. female.  HPI   Shari Lawrence is a 56 y.o. female, with a history of HTN, presenting to the ED with left leg pain, intermittent over the last 3 weeks.  Pain is described as a fullness, 5/10, located just above the knee, nonradiating.   She also mentions she is being evaluated for possible lupus with Dr. Allegra Grana, [facility], with whom she has an appointment on January 2. Denies falls/trauma, fever/chills, knee pain or swelling, numbness/tingling, weakness, or any other complaints.   Past Medical History:  Diagnosis Date  . Abnormal Pap smear 1988   treated with cryo  . Anemia 11/2010   hematology consult prior; etiology malabsorption and uterine bleeding  . Anxiety   . Gastric polyp   . GERD (gastroesophageal reflux disease)   . H/O bone density study 12/2007  . H/O hysterectomy for benign disease 5/14  . History of echocardiogram 03/2010   normal LV function, EF 60-65%, mild left atrial enlargement  . HTN (hypertension) 03/2010   hospitalization for HTN urgency  . Hyperlipemia   . Internal hemorrhoids   . Lumbar degenerative disc disease   . Migraine   . Myalgia   . Spondylosis, cervical   . Umbilical hernia   . Uterine fibroid    hx/o     Patient Active Problem List   Diagnosis Date Noted  . High risk medication use 03/30/2017  . Chronic constipation 11/17/2016  . Globus sensation 11/17/2016  . Primary osteoarthritis of both knees 10/05/2016  . Autoimmune disease (Shari Lawrence) 09/29/2016  . Primary osteoarthritis of both hands 09/29/2016  . Family history of lupus erythematosus 09/20/2016  . Polyarthralgia 05/06/2016  . Clenching of teeth 05/06/2016  . Finger swelling 05/06/2016  . Morning stiffness of joints 05/06/2016  . Myalgia  05/06/2016  . Family history of rheumatoid arthritis 05/06/2016  . Essential hypertension 12/23/2014  . Hyperlipidemia 12/23/2014  . History of anemia 12/23/2014  . Care provider for parents 12/23/2014  . Impaired fasting glucose 10/25/2011  . Gluten intolerance 10/25/2011  . Gastroesophageal reflux disease without esophagitis 10/25/2011  . Anxiety state   . Iron deficiency anemia     Past Surgical History:  Procedure Laterality Date  . CERVICAL FUSION  2010  . CERVIX LESION DESTRUCTION  1988  . CESAREAN SECTION    . CHOLECYSTECTOMY  2003  . COLONOSCOPY  08/2010   Dr. Collene Mares  . ESOPHAGOGASTRODUODENOSCOPY  08/2010   Dr. Collene Mares  . EXPLORATORY LAPAROTOMY    . HERNIA REPAIR    . MYOMECTOMY  1998  . PELVIC LAPAROSCOPY    . ROBOTIC ASSISTED LAPAROSCOPIC HYSTERECTOMY AND SALPINGECTOMY  10/2012   [facility]; laproscopic due to fibroids  . UMBILICAL HERNIA REPAIR  10/2013   infected laparoscopic port, mesh placement    OB History    Gravida Para Term Preterm AB Living   1 1 1  0 0 1   SAB TAB Ectopic Multiple Live Births   0 0 0 0 1       Home Medications    Prior to Admission medications   Medication Sig Start Date End Date Taking? Authorizing Provider  acetaminophen (TYLENOL) 500 MG tablet Take 1 tablet (500 mg total) by mouth every 6 (six)  hours as needed. 04/01/17   Waynetta Pean, PA-C  amLODipine (NORVASC) 5 MG tablet TAKE 1 TABLET BY MOUTH EVERY DAY 12/05/16   Tysinger, [name], PA-C  Ascorbic Acid (VITAMIN C) 1000 MG tablet Take 1,000 mg by mouth daily.     [provider]  atorvastatin (LIPITOR) 20 MG tablet TAKE 1 TABLET (20 MG TOTAL) BY MOUTH DAILY. 05/29/17   Tysinger, [name], PA-C  B Complex Vitamins (B COMPLEX PO) Take by mouth.    [provider]  calcium carbonate (OS-CAL) 600 MG TABS Take 600 mg by mouth daily.     [provider]  [facility]S ASPIRIN LOW DOSE 81 MG EC tablet TAKE 1 TABLET (81 MG TOTAL) BY MOUTH DAILY. 11/17/16   Tysinger, [name], PA-C    diclofenac sodium (VOLTAREN) 1 % GEL Apply 4 g topically 4 (four) times daily. 06/26/17   Addalyn Speedy C, PA-C  estradiol (VIVELLE-DOT) 0.05 MG/24HR patch Place 1 patch (0.05 mg total) onto the skin 2 (two) times a week. 06/01/17   [name], MD  fish oil-omega-3 fatty acids 1000 MG capsule Take 2 g by mouth daily.    [provider]  Ginger, Zingiber officinalis, (GINGER ROOT PO) Take by mouth.    [provider]  hydroxychloroquine (PLAQUENIL) 200 MG tablet TAKE 1 TABLET (200 MG TOTAL) BY MOUTH 2 (TWO) TIMES DAILY. MONDAY THROUGH FRIDAY 04/19/17   [name], MD  linaclotide Rolan Lipa) 72 MCG capsule Take 1 capsule (72 mcg total) by mouth daily before breakfast. 11/17/16   Tysinger, [name], PA-C  Misc Natural Products (TART CHERRY ADVANCED PO) Take by mouth.    [provider]  Multiple Vitamin (MULTIVITAMIN) tablet Take 1 tablet by mouth daily.     [provider]  NEXIUM 40 MG capsule Take 1 capsule by mouth daily. Reported on 12/02/2015 03/13/15   [provider]  OLIVE LEAF PO Take by mouth.    [provider]  Probiotic Product (PROBIOTIC ADVANCED PO) Take by mouth.    [provider]  TURMERIC PO Take by mouth.    [provider]    Family History Family History  Problem Relation Age of Onset  . Hypertension Mother   . Dementia Mother   . Colon cancer Mother 47  . Deep vein thrombosis Father   . Hypertension Father   . Cancer Father        prostate CA  . Heart disease Father 11       CAD  . Hypertension Sister   . Hyperlipidemia Sister   . Hypertension Brother   . Hyperlipidemia Brother   . Hypertension Maternal Aunt   . Hyperlipidemia Maternal Aunt   . Rheum arthritis Maternal Aunt   . Diabetes Maternal Aunt        1 with type II, 1 with type 1  . Hypertension Brother   . Rheum arthritis Brother   . Diabetes Paternal Aunt        type II    Social History Social History   Tobacco  Use  . Smoking status: Never Smoker  . Smokeless tobacco: Never Used  Substance Use Topics  . Alcohol use: No  . Drug use: No     Allergies   Quinolones; Latex; Penicillins; Percocet [oxycodone-acetaminophen]; Sulfa drugs cross reactors; Vicodin [hydrocodone-acetaminophen]; and Gluten meal   Review of Systems Review of Systems  Constitutional: Negative for chills and fever.  Musculoskeletal: Positive for myalgias.  Neurological: Negative for  weakness and numbness.     Physical Exam Updated Vital Signs BP (!) 153/90   Pulse 73   Temp 98.2 F (36.8 C) (Oral)   Resp 16   Ht 5\' 4"  (1.626 m)   Wt 57.2 kg (126 lb)   LMP 10/25/2012 (Approximate)   SpO2 100%   BMI 21.63 kg/m   Physical Exam  Constitutional: She appears well-developed and well-nourished. No distress.  HENT:  Head: Normocephalic and atraumatic.  Eyes: Conjunctivae are normal.  Neck: Neck supple.  Cardiovascular: Normal rate, regular rhythm and intact distal pulses.  Pulmonary/Chest: Effort normal.  Musculoskeletal: She exhibits tenderness. She exhibits no edema.  Very minimal tenderness to soft tissue overlying the left distal femur.  No noted swelling, ecchymosis, masses, erythema, increased warmth, or other abnormalities. Full range of motion without pain in the left knee.  No tenderness, swelling, erythema, or increased warmth to the left knee. No noted swelling, increased warmth, erythema, or tenderness to the left calf. Patient is ambulatory without assistance.  Neurological: She is alert.  No noted sensory deficits in the left lower extremity. Flexion and extension 5/5 in the left knee and ankle.  Skin: Skin is warm and dry. Capillary refill takes less than 2 seconds. She is not diaphoretic. No pallor.  Psychiatric: She has a normal mood and affect. Her behavior is normal.  Nursing note and vitals reviewed.    ED Treatments / Results  Labs (all labs ordered are listed, but only abnormal results  are displayed) Labs Reviewed  URINALYSIS, ROUTINE W REFLEX MICROSCOPIC - Abnormal; Notable for the following components:      Result Value   Hgb urine dipstick SMALL (*)    All other components within normal limits  URINALYSIS, MICROSCOPIC (REFLEX) - Abnormal; Notable for the following components:   Bacteria, UA RARE (*)    Squamous Epithelial / LPF 0-5 (*)    All other components within normal limits    EKG  EKG Interpretation None       Radiology US Venous Img Lower Unilateral Left  Result Date: 06/26/2017 CLINICAL DATA:  Left leg pain and swelling for 3 weeks. EXAM: LEFT LOWER EXTREMITY VENOUS DOPPLER ULTRASOUND TECHNIQUE: Gray-scale sonography with graded compression, as well as color Doppler and duplex ultrasound were performed to evaluate the lower extremity deep venous systems from the level of the common femoral vein and including the common femoral, femoral, profunda femoral, popliteal and calf veins including the posterior tibial, peroneal and gastrocnemius veins when visible. The superficial great saphenous vein was also interrogated. Spectral Doppler was utilized to evaluate flow at rest and with distal augmentation maneuvers in the common femoral, femoral and popliteal veins. COMPARISON:  None. FINDINGS: Contralateral Common Femoral Vein: Respiratory phasicity is normal and symmetric with the symptomatic side. No evidence of thrombus. Normal compressibility. Common Femoral Vein: No evidence of thrombus. Normal compressibility, respiratory phasicity and response to augmentation. Saphenofemoral Junction: No evidence of thrombus. Normal compressibility and flow on color Doppler imaging. Profunda Femoral Vein: No evidence of thrombus. Normal compressibility and flow on color Doppler imaging. Femoral Vein: No evidence of thrombus. Normal compressibility, respiratory phasicity and response to augmentation. Popliteal Vein: No evidence of thrombus. Normal compressibility, respiratory  phasicity and response to augmentation. Calf Veins: No evidence of thrombus. Normal compressibility and flow on color Doppler imaging. Superficial Great Saphenous Vein: No evidence of thrombus. Normal compressibility. Venous Reflux:  None. Other Findings:  None. IMPRESSION: No evidence of deep venous thrombosis. Electronically Signed   By:  Earle Gell M.D.   On: 06/26/2017 20:46    Procedures Procedures (including critical care time)  Medications Ordered in ED Medications - No data to display   Initial Impression / Assessment and Plan / ED Course  I have reviewed the triage vital signs and the nursing notes.  Pertinent labs & imaging results that were available during my care of the patient were reviewed by me and considered in my medical decision making (see chart for details).     Patient presents with persistent left thigh pain.  No noted significant abnormality on exam.  No evidence of DVT on ultrasound.  Patient advised to continue evaluation with her orthopedic specialist.  Return precautions discussed.     Final Clinical Impressions(s) / ED Diagnoses   Final diagnoses:  Left leg pain    ED Discharge Orders        Ordered    diclofenac sodium (VOLTAREN) 1 % GEL  4 times daily     06/26/17 2131       Layla Maw 06/28/17 0128    Macarthur Critchley, MD 07/02/17 [phone]

## 2017-06-28 ENCOUNTER — Telehealth: Payer: Self-pay | Admitting: Medical

## 2017-06-28 ENCOUNTER — Encounter: Payer: Self-pay | Admitting: Medical

## 2017-06-28 ENCOUNTER — Ambulatory Visit: Payer: BC Managed Care – PPO | Admitting: Medical

## 2017-06-28 VITALS — BP 148/100 | HR 60 | Wt 125.6 lb

## 2017-06-28 DIAGNOSIS — T148XXA Other injury of unspecified body region, initial encounter: Secondary | ICD-10-CM | POA: Insufficient documentation

## 2017-06-28 DIAGNOSIS — M7918 Myalgia, other site: Secondary | ICD-10-CM | POA: Diagnosis not present

## 2017-06-28 DIAGNOSIS — M79605 Pain in left leg: Secondary | ICD-10-CM | POA: Insufficient documentation

## 2017-06-28 DIAGNOSIS — D8989 Other specified disorders involving the immune mechanism, not elsewhere classified: Secondary | ICD-10-CM

## 2017-06-28 DIAGNOSIS — R030 Elevated blood-pressure reading, without diagnosis of hypertension: Secondary | ICD-10-CM

## 2017-06-28 DIAGNOSIS — M7989 Other specified soft tissue disorders: Secondary | ICD-10-CM

## 2017-06-28 DIAGNOSIS — M359 Systemic involvement of connective tissue, unspecified: Secondary | ICD-10-CM

## 2017-06-28 NOTE — Telephone Encounter (Signed)
I spoke to patient about the confusion.  She is coming in today for eval on the leg lump and lower leg concern too since their was confusion at the imaging center Monday.  FYI Disregard note below, dictation was cut off.  She had come in recently for several issues including lump of left leg.  However after she saw a rheumatologist she called back requesting imaging of the lump.  We initially planned for CT per radiology recommendations, however insurance denied coverage.  We then ordered an x-ray which showed a possible abnormality which prompted radiology to recommend MRI.  Thus we ordered an MRI.  On Monday New Year's Eve she was scheduled to have MRI but when she got there there was confusion over the diagnosis and they wanted to do an ultrasound to rule out DVT.  She apparently told them that she would just go to the emergency department which she did.  I spoke to [facility] imaging and the information they received with this was to rule out DVT, and I informed them that that was not correct.  This was supposed to be an image to evaluate for mass.

## 2017-06-28 NOTE — Patient Instructions (Signed)
Erythema Nodosum Erythema nodosum is a skin condition in which patches of fat under the skin of the lower legs become inflamed. This causes painful bumps (nodules) to form. What are the causes? Common causes of this condition include:  Infections.  Certain medicines, especially birth control pills, penicillin, and sulfa medicines.  Other causes include:  Pregnancy.  Certain inflammatory conditions, including Lupus, Crohn's disease, and thyroid conditions.  In some cases, the cause may not be known. What increases the risk? This condition is more likely to develop in young adult women. What are the signs or symptoms? The main symptom of this condition is large nodules that look like raised bruises and are tender to the touch. These nodules usually appear on the shins, but they may also appear on the arms or the trunk. They gradually change in color from pink to brown, and they leave a dark mark that clears up in several months. Other symptoms include:  Fever.  Fatigue.  Joint pain.  Itchiness.  How is this diagnosed? This condition is diagnosed based on symptoms. To find the underlying condition that caused the erythema nodosum, your health care provider may also do a physical exam, X-rays, and blood tests. How is this treated? Treatment for this condition depends on the cause. The nodules usually go away with treatment of the underlying condition. Any pain or discomfort may be treated with:  Anti-inflammatory medicines.  Bed rest.  Raising (elevating) the affected area.  Cool compresses.  In some cases, steroids and potassium iodide tablets may be given. Follow these instructions at home:  Take medicines only as directed by your health care provider.  Stay in bed for as long as directed by your health care provider.  Until your symptoms go away, limit any exercising that makes you breathe harder and faster (vigorous).  Elevate the affected leg as directed by your  health care provider.  Apply cool compresses to the affected area as directed by your health care provider. Contact a health care provider if:  Your symptoms are not improving.  You have a fever that does not go away. Get help right away if:  Your condition gets worse.  Your pain gets worse.  You have a sore throat.  You vomit repeatedly. This information is not intended to replace advice given to you by your health care provider. Make sure you discuss any questions you have with your health care provider. Document Released: 07/21/2004 Document Revised: 11/19/2015 Document Reviewed: 05/21/2014 Elsevier Interactive Patient Education  [employer].

## 2017-06-28 NOTE — Progress Notes (Signed)
Subjective: Chief Complaint  Patient presents with  . left leg lumps atleast 2    lumps burn when stand x 1 month lumps   Here for leg pain and lumps.  She sees rheumatology, Dr. Estanislado Pandy.   Has a diagnosis of autoimmune disease and myofascial pain.  She has been complaining of a possible lump in her left leg for several weeks now.  I saw her back in November for several concerns including leg pain.  She feels as though there could be a not or lump in the left leg just above the knee laterally.  She also notes that she gets nodules and bruising, several at a time on her lower legs anteriorly they come and go.  They are painful when they come on.  Since her last visit though she has gotten worsening burning pain throughout the left leg not just the upper leg.  She denies numbness in her feet or legs.  Sometimes cramps in calve, tight in leg at times.  We had been communicating by phone about this possible lump.  We initially schedule her for a CT of the leg but her insurance denied coverage.  Thus we did a x-ray instead.  When the x-ray was a little unclear we decided to pursue other imaging at the radiologist recommendation.. We set her up for MRI left femur which was scheduled this past Monday on New Year's Eve.  However there was confusion between their office in our office on the diagnosis so they wanted to do an ultrasound and she never had the test done.  She ended up going to the emergency department that same day and had an evaluation to rule out DVT which was negative.  She sees rheumatology next week for follow-up.  Past Medical History:  Diagnosis Date  . Abnormal Pap smear 1988   treated with cryo  . Anemia 11/2010   hematology consult prior; etiology malabsorption and uterine bleeding  . Anxiety   . Gastric polyp   . GERD (gastroesophageal reflux disease)   . H/O bone density study 12/2007  . H/O hysterectomy for benign disease 5/14  . History of echocardiogram 03/2010   normal LV  function, EF 60-65%, mild left atrial enlargement  . HTN (hypertension) 03/2010   hospitalization for HTN urgency  . Hyperlipemia   . Internal hemorrhoids   . Lumbar degenerative disc disease   . Migraine   . Myalgia   . Spondylosis, cervical   . Umbilical hernia   . Uterine fibroid    hx/o    Current Outpatient Medications on File Prior to Visit  Medication Sig Dispense Refill  . acetaminophen (TYLENOL) 500 MG tablet Take 1 tablet (500 mg total) by mouth every 6 (six) hours as needed. 30 tablet 0  . amLODipine (NORVASC) 5 MG tablet TAKE 1 TABLET BY MOUTH EVERY DAY 90 tablet 1  . Ascorbic Acid (VITAMIN C) 1000 MG tablet Take 1,000 mg by mouth daily.     Marland Kitchen atorvastatin (LIPITOR) 20 MG tablet TAKE 1 TABLET (20 MG TOTAL) BY MOUTH DAILY. 90 tablet 0  . B Complex Vitamins (B COMPLEX PO) Take by mouth.    . calcium carbonate (OS-CAL) 600 MG TABS Take 600 mg by mouth daily.     . [facility] ASPIRIN LOW DOSE 81 MG EC tablet TAKE 1 TABLET (81 MG TOTAL) BY MOUTH DAILY. 90 tablet 2  . diclofenac sodium (VOLTAREN) 1 % GEL Apply 4 g topically 4 (four) times daily. 100 g 0  .  estradiol (VIVELLE-DOT) 0.05 MG/24HR patch Place 1 patch (0.05 mg total) onto the skin 2 (two) times a week. 8 patch 1  . fish oil-omega-3 fatty acids 1000 MG capsule Take 2 g by mouth daily.    . Ginger, Zingiber officinalis, (GINGER ROOT PO) Take by mouth.    . hydroxychloroquine (PLAQUENIL) 200 MG tablet TAKE 1 TABLET (200 MG TOTAL) BY MOUTH 2 (TWO) TIMES DAILY. MONDAY THROUGH FRIDAY 40 tablet 2  . linaclotide (LINZESS) 72 MCG capsule Take 1 capsule (72 mcg total) by mouth daily before breakfast. 15 capsule 0  . Misc Natural Products (TART CHERRY ADVANCED PO) Take by mouth.    . Multiple Vitamin (MULTIVITAMIN) tablet Take 1 tablet by mouth daily.     Marland Kitchen NEXIUM 40 MG capsule Take 1 capsule by mouth daily. Reported on 12/02/2015    . Probiotic Product (PROBIOTIC ADVANCED PO) Take by mouth.    . TURMERIC PO Take by mouth.    . OLIVE  LEAF PO Take by mouth.     No current facility-administered medications on file prior to visit.    ROS as in subjective    Objective: BP (!) 148/100   Pulse 60   Wt 125 lb 9.6 oz (57 kg)   LMP 10/25/2012 (Approximate)   BMI 21.56 kg/m   BP Readings from Last 3 Encounters:  06/28/17 (!) 148/100  06/26/17 118/75  05/30/17 120/70   Gen: wd, wn, nad Her legs in general were nontender, no obvious swelling, no obvious mass or nodule.  No obvious varicosities.  She has 1+ pedal pulses She has normal leg strength and sensation  she has one small area of purplish bruising of her right anterior lower leg without obvious nodule Leg range of motion normal, no other obvious deformity of the legs  no edema lower extremities     Assessment: Encounter Diagnoses  Name Primary?  . Autoimmune disease ([place]) Yes  . Transient elevated blood pressure   . Leg pain, diffuse, left   . Leg swelling   . Bruising   . Myofascial muscle pain     Plan: I reviewed her recent rheumatology notes from April 11, 2017.  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  We also reviewed her ED visit notes and venous doppler ultrasound from 06/26/17, and reviewed her recent femur xray.    We discussed her exam findings, recent reports of what may be erythema nodosum based on her description.  At this point there is no obvious mass of her leg, no obvious varicose veins, she has 1+ pulses but no neurological finding or other abnormality on exam.  She will follow-up with Dr. Estanislado Pandy next week.  Dr. Estanislado Pandy  apparently has x-ray and ultrasound availability in the office.  I will await Ms. Polgar to call me back once she sees her rheumatologist   we can then decide whether or not we move forward with MRI of the left femur given her  reported findings of lump although its not completely obvious on exam.  I suspect connective tissue or myofascial inflammation and not necessarily a mass.   Interestingly her leg sympotms were improved with topical diclofenac that was prescribed by the ED on 06/26/17.   I reviewed her blood pressures in the chart, today's pressure is elevated but normally her pressures are controlled  Aspin was seen today for left leg lumps atleast 2.  Diagnoses and all orders for this visit:  Autoimmune disease ([place])  Transient elevated blood pressure  Leg pain, diffuse, left  Leg swelling  Bruising  Myofascial muscle pain

## 2017-06-28 NOTE — Telephone Encounter (Signed)
Patient states she is still having issues with her legs. Patient states she is currently seeing her PCP for this. Patient states the PCP wants her to follow with [place] regarding the problem. Patient has been scheduled for 07/03/17 at 1:30 pm.

## 2017-06-28 NOTE — Telephone Encounter (Signed)
Please call patient about the recent emergency department visit.  There apparently was confusion on Monday at [place].  To revisit this issue, she had come in recently

## 2017-06-29 NOTE — Progress Notes (Signed)
Office Visit Note  Patient: Shari Lawrence             Date of Birth: 1960/12/19           MRN: [id number]             PCP: Carlena Hurl, PA-C Referring: Carlena Hurl, PA-C Visit Date: 07/03/2017 Occupation: @GUAROCC @    Subjective:  Bilateral leg pain   History of Present Illness: Shari Lawrence is a 57 y.o. female with history of autoimmune disease myofascial muscle pain, and osteoarthritis.  Patient states she continues to take PLQ 200 mg BID M-F.  She states she has had nasal ulcers recently but denies any mouth ulcers.  She states she has been experiencing increased fatigue.  She also has been having bilateral hand pain and stiffness in the morning.  She has also noticed decreased grip strength.  She states her fingers have been very cold and have been numb in her fingertips.  She states her fingertips turn white sometimes when they are very cold.  She has increased constipation.  She denies any medication changes or changes in diet.  She has been taking fiber and eating fruits and increased water intake.   Her main concern is bilateral leg pain.  She states the pain started about 1 month ago.  She initially noticed "knots" in her thigh muscles.  The pain is worse above her knee.  She states she has noticed diffuse swelling around these nodules.  She states she has noticed some muscle weakness due to not exercising as much due to the pain.  She states her left leg pain is worse than the right.  She denies any injury or muscle spasms.  She states she has seen her PCP about this concern who advised her to watch and wait.  Her PCP ordered a MRI of her left femur, which has not been performed because she wanted to come to this appointment first.  She describes the pain as a burning and a fullness sensation, especially after standing for prolonged periods of time.  She states Voltaren gel helps.  She was seen in the ED on 06/26/17.    Activities of Daily Living:  Patient reports  morning stiffness for 1 hour.   Patient Reports nocturnal pain.  Difficulty dressing/grooming: Denies Difficulty climbing stairs: Reports Difficulty getting out of chair: Reports Difficulty using hands for taps, buttons, cutlery, and/or writing: Reports   Review of Systems  Constitutional: Negative for fatigue and weakness.  HENT: Positive for mouth dryness. Negative for mouth sores and nose dryness.   Eyes: Positive for redness. Negative for dryness.  Respiratory: Negative for cough, hemoptysis, shortness of breath and difficulty breathing.   Cardiovascular: Positive for palpitations. Negative for chest pain, hypertension, irregular heartbeat and swelling in legs/feet.  Gastrointestinal: Positive for constipation. Negative for blood in stool and diarrhea.  Endocrine: Negative for increased urination.  Genitourinary: Negative for painful urination.  Musculoskeletal: Positive for arthralgias, joint pain, joint swelling, morning stiffness and muscle tenderness. Negative for myalgias, muscle weakness and myalgias.  Skin: Positive for color change. Negative for pallor, rash, hair loss, nodules/bumps, redness, skin tightness and sensitivity to sunlight.  Neurological: Negative for dizziness, numbness and headaches.  Hematological: Negative for swollen glands.  Psychiatric/Behavioral: Positive for depressed mood and sleep disturbance. The patient is nervous/anxious.     PMFS History:  Patient Active Problem List   Diagnosis Date Noted  . Transient elevated blood pressure 06/28/2017  . Leg  pain, diffuse, left 06/28/2017  . Leg swelling 06/28/2017  . Bruising 06/28/2017  . Myofascial muscle pain 06/28/2017  . High risk medication use 03/30/2017  . Chronic constipation 11/17/2016  . Globus sensation 11/17/2016  . Primary osteoarthritis of both knees 10/05/2016  . Autoimmune disease ([place]) 09/29/2016  . Primary osteoarthritis of both hands 09/29/2016  . Family history of lupus  erythematosus 09/20/2016  . Polyarthralgia 05/06/2016  . Clenching of teeth 05/06/2016  . Finger swelling 05/06/2016  . Morning stiffness of joints 05/06/2016  . Myalgia 05/06/2016  . Family history of rheumatoid arthritis 05/06/2016  . Essential hypertension 12/23/2014  . Hyperlipidemia 12/23/2014  . History of anemia 12/23/2014  . Care provider for parents 12/23/2014  . Impaired fasting glucose 10/25/2011  . Gluten intolerance 10/25/2011  . Gastroesophageal reflux disease without esophagitis 10/25/2011  . Anxiety state   . Iron deficiency anemia     Past Medical History:  Diagnosis Date  . Abnormal Pap smear 1988   treated with cryo  . Anemia 11/2010   hematology consult prior; etiology malabsorption and uterine bleeding  . Anxiety   . Gastric polyp   . GERD (gastroesophageal reflux disease)   . H/O bone density study 12/2007  . H/O hysterectomy for benign disease 5/14  . History of echocardiogram 03/2010   normal LV function, EF 60-65%, mild left atrial enlargement  . HTN (hypertension) 03/2010   hospitalization for HTN urgency  . Hyperlipemia   . Internal hemorrhoids   . Lumbar degenerative disc disease   . Migraine   . Myalgia   . Spondylosis, cervical   . Umbilical hernia   . Uterine fibroid    hx/o     Family History  Problem Relation Age of Onset  . Hypertension Mother   . Dementia Mother   . Colon cancer Mother 28  . Deep vein thrombosis Father   . Hypertension Father   . Cancer Father        prostate CA  . Heart disease Father 66       CAD  . Hypertension Sister   . Hyperlipidemia Sister   . Hypertension Brother   . Hyperlipidemia Brother   . Hypertension Maternal Aunt   . Hyperlipidemia Maternal Aunt   . Rheum arthritis Maternal Aunt   . Diabetes Maternal Aunt        1 with type II, 1 with type 1  . Hypertension Brother   . Rheum arthritis Brother   . Diabetes Paternal Aunt        type II   Past Surgical History:  Procedure Laterality Date    . CERVICAL FUSION  2010  . CERVIX LESION DESTRUCTION  1988  . CESAREAN SECTION    . CHOLECYSTECTOMY  2003  . COLONOSCOPY  08/2010   Dr. Collene Mares  . ESOPHAGOGASTRODUODENOSCOPY  08/2010   Dr. Collene Mares  . EXPLORATORY LAPAROTOMY    . HERNIA REPAIR    . MYOMECTOMY  1998  . PELVIC LAPAROSCOPY    . ROBOTIC ASSISTED LAPAROSCOPIC HYSTERECTOMY AND SALPINGECTOMY  10/2012   [facility]; laproscopic due to fibroids  . 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  Has 28 yo daughter.  Her siblings  and her don't agree on helping take care of their parents, causes family tension.  As of 11/2014     Objective: Vital Signs: BP 129/86 (BP Location: Left Arm, Patient Position: Sitting, Cuff Size: Normal)   Pulse (!) 59   Resp 16   Ht 5\' 4"  (1.626 m)   Wt 125 lb (56.7 kg)   LMP 10/25/2012 (Approximate)   BMI 21.46 kg/m    Physical Exam  Constitutional: She is oriented to person, place, and time. She appears well-developed and well-nourished.  HENT:  Head: Normocephalic and atraumatic.  Eyes: Conjunctivae and EOM are normal.  Neck: Normal range of motion.  Cardiovascular: Normal rate, regular rhythm, normal heart sounds and intact distal pulses.  Pulmonary/Chest: Effort normal and breath sounds normal.  Abdominal: Soft. Bowel sounds are normal.  Lymphadenopathy:    She has no cervical adenopathy.  Neurological: She is alert and oriented to person, place, and time.  Skin: Skin is warm and dry. Capillary refill takes less than 2 seconds.  Psychiatric: She has a normal mood and affect. Her behavior is normal.  Nursing note and vitals reviewed.    Musculoskeletal Exam: C-spine, thoracic, and lumbar  spine good ROM.  No midline spinal tenderness.  No SI joint tenderness.  Shoulder joints, elbow joints, and wrist joints good ROM.  MCPs, PIPs, and DIPs good ROM with no synovitis.  Hip joints, knee joints, ankle joints, MTPs, PIPs, and DIPs good ROM with no synovitis. Knee joint crepitus bilaterally.  No effusion or warmth.  Muscle tenderness superior to knees bilaterally.  No nodules palpated. Bilateral trochanteric bursa tenderness.    CDAI Exam: No CDAI exam completed.    Investigation: No additional findings.PLQ eye exam: 12/06/2016 CBC Latest Ref Rng & Units 04/11/2017 01/04/2017 08/30/2016  WBC 3.8 - 10.8 Thousand/uL 4.8 4.1 4.3  Hemoglobin 11.7 - 15.5 g/dL 12.2 12.2 12.4  Hematocrit 35.0 - 45.0 % 37.7 38.0 38.4  Platelets 140 - 400 Thousand/uL 184 175 202   CMP Latest Ref Rng & Units 04/11/2017 01/04/2017 08/30/2016  Glucose 65 - 99 mg/dL 88 67 66  BUN 7 - 25 mg/dL 9 12 9   Creatinine 0.50 - 1.05 mg/dL 0.89 0.87 0.82  Sodium 135 - 146 mmol/L 140 140 141  Potassium 3.5 - 5.3 mmol/L 4.4 4.2 4.0  Chloride 98 - 110 mmol/L 105 106 103  CO2 20 - 32 mmol/L 26 24 23   Calcium 8.6 - 10.4 mg/dL 9.7 9.4 9.4  Total Protein 6.1 - 8.1 g/dL 7.0 6.4 7.2  Total Bilirubin 0.2 - 1.2 mg/dL 0.4 0.3 0.3  Alkaline Phos 33 - 130 U/L - 52 52  AST 10 - 35 U/L 22 18 26   ALT 6 - 29 U/L 21 13 25     Imaging: Dg Esophagus  Result Date: 06/21/2017 CLINICAL DATA:  57 year old female with globus sensation. Feeling of Medicine and tablets traversing slowly. Personal history of a 2004 esophageal dilatation and esophagram. EXAM: ESOPHOGRAM / BARIUM SWALLOW / BARIUM TABLET STUDY TECHNIQUE: Combined double contrast and single contrast examination performed using effervescent crystals, thick barium liquid, and thin barium liquid. The patient was observed with fluoroscopy swallowing a 13 mm barium sulphate tablet. FLUOROSCOPY TIME:  Fluoroscopy Time:  1 minutes 48 seconds Radiation Exposure Index (if provided by the  fluoroscopic device): 23 mGy Number of Acquired Spot Images: 0 COMPARISON:  Report of esophagram 05/05/2003 (no images available). Chest CTA 04/07/2010. FINDINGS: A double contrast study was undertaken and the patient tolerated this well and  without difficulty. No obstruction to the forward flow of contrast throughout the esophagus and into the stomach. Normal esophageal course and contour. Normal esophageal mucosal pattern. There is mild impression on the upper thoracic esophagus from the aortic arch which appears normal. The 12.5 mm barium tablet briefly paused at this level (series 4), but then passed into the stomach with one additional swallow of thin barium. The patient was mildly symptomatic - with the typical feeling she has when swallowing tablets. Prior cervical ACDF at C5-C6 and C6-C7. Normal cervical esophagus. Mild smooth contour deformity along the posterior hypopharynx corresponding to the C4-C5 disc space where mild endplate spurring is evident. With prone swallows esophageal motility is normal. No tertiary contractions. Normal gastroesophageal junction. Normal gastric emptying incidentally noted at the conclusion of the study. No gastroesophageal reflux occurred spontaneously or was elicited. IMPRESSION: Essentially normal Esophagram; - a 12.5 mm barium tablet briefly paused at the level of the aortic compression on the esophagus, but passed into the stomach with one additional swallow of barium. - mild impression on the posterior hypopharynx corresponding C4-C5 disc and endplate degeneration which is the adjacent segment to the prior C5 thru C7 ACDF. Electronically Signed   By: Genevie Ann M.D.   On: 06/21/2017 12:06   US Venous Img Lower Unilateral Left  Result Date: 06/26/2017 CLINICAL DATA:  Left leg pain and swelling for 3 weeks. EXAM: LEFT LOWER EXTREMITY VENOUS DOPPLER ULTRASOUND TECHNIQUE: Gray-scale sonography with graded compression, as well as color Doppler and duplex ultrasound were  performed to evaluate the lower extremity deep venous systems from the level of the common femoral vein and including the common femoral, femoral, profunda femoral, popliteal and calf veins including the posterior tibial, peroneal and gastrocnemius veins when visible. The superficial great saphenous vein was also interrogated. Spectral Doppler was utilized to evaluate flow at rest and with distal augmentation maneuvers in the common femoral, femoral and popliteal veins. COMPARISON:  None. FINDINGS: Contralateral Common Femoral Vein: Respiratory phasicity is normal and symmetric with the symptomatic side. No evidence of thrombus. Normal compressibility. Common Femoral Vein: No evidence of thrombus. Normal compressibility, respiratory phasicity and response to augmentation. Saphenofemoral Junction: No evidence of thrombus. Normal compressibility and flow on color Doppler imaging. Profunda Femoral Vein: No evidence of thrombus. Normal compressibility and flow on color Doppler imaging. Femoral Vein: No evidence of thrombus. Normal compressibility, respiratory phasicity and response to augmentation. Popliteal Vein: No evidence of thrombus. Normal compressibility, respiratory phasicity and response to augmentation. Calf Veins: No evidence of thrombus. Normal compressibility and flow on color Doppler imaging. Superficial Great Saphenous Vein: No evidence of thrombus. Normal compressibility. Venous Reflux:  None. Other Findings:  None. IMPRESSION: No evidence of deep venous thrombosis. Electronically Signed   By: Earle Gell M.D.   On: 06/26/2017 20:46   Dg Femur Min 2 Views Left  Result Date: 06/21/2017 CLINICAL DATA:  Palpable fullness distal femur region EXAM: LEFT FEMUR 2 VIEWS COMPARISON:  None. FINDINGS: Frontal and lateral views were obtained. No fracture or dislocation. Joint spaces appear normal. No erosive change. No knee joint effusion. No soft tissue mass is demonstrated by radiography. There is nonspecific  soft tissue fullness in the distal thigh region. IMPRESSION: No well-defined soft tissue mass seen by radiography. Nonspecific soft tissue fullness in distal thigh region. If there remains concern for mass in this area, MR would be the optimum imaging study of choice for further assessment. No fracture or dislocation. No abnormal periosteal reaction. No arthropathic change. Electronically  Signed   By: Lowella Grip III M.D.   On: 06/21/2017 10:31    Speciality Comments: No specialty comments available.   Procedures:  No procedures performed Allergies: Quinolones; Latex; Penicillins; Percocet [oxycodone-acetaminophen]; Sulfa drugs cross reactors; Vicodin [hydrocodone-acetaminophen]; and Gluten meal   Assessment / Plan:     Visit Diagnoses: Autoimmune disease ([place]) - ANA 1:320 NO, ds8, RF-,CCP-,History of nasal ulcers and oral ulcers in the past, positive synovitis in left hand MCPs on [place] previously.  She continues to have occasional nasal ulcers.  No mouth or nasal ulcers.  No synovitis on exam. No rashes. She will continue to take PLQ 200 mg po BID M-F.    High risk medication use - Plaquenil 200 mg po bid M-F (11/2016) eye exam: 12/06/2016. She will be due for routine CBC/CMP in March.  She will also get her autoimmune labs at this time. Future orders were placed for the autoimmune labs.    Primary osteoarthritis of both hands: She continues to have discomfort in her hands and CMC joints bilaterally.  Joint protection and muscle strengthening discussed.  She was given a handout for hand exercises.    Primary osteoarthritis of both knees: She has crepitus bilaterally.  No warmth or effusion on exam.  Her knees are not causing discomfort at this time.   Myofascial muscle pain: She has been experiencing pain in her bilateral quadriceps.  She was seen in the ED 06/26/17-ultrasound was negative for a DVT.  She had no neurologic deficits and full ROM of bilateral extremities.  She was given Voltaren  gel, which helps with the discomfort.  She can continue to use Voltaren gel and follow up with her PCP.  Family history of lupus erythematosus  Family history of rheumatoid arthritis  Other medical conditions are listed as follows:   History of hyperlipidemia  History of anxiety  History of hypertension  History of anemia    Orders: Orders Placed This Encounter  Procedures  . COMPLETE METABOLIC PANEL WITH GFR  . Urinalysis, Routine w reflex microscopic  . CBC with Differential/Platelet  . Anti-DNA antibody, double-stranded  . C3 and C4  . Sedimentation rate   No orders of the defined types were placed in this encounter.   Face-to-face time spent with patient was 30 minutes. Greater than 50% of time was spent in counseling and coordination of care.  Follow-Up Instructions: Return in about 5 months (around 12/01/2017) for Autoimmune disease, Osteoarthritis . [name], MD  Note - This record has been created using Editor, commissioning.  Chart creation errors have been sought, but may not always  have been located. Such creation errors do not reflect on  the standard of medical care.

## 2017-07-03 ENCOUNTER — Encounter: Payer: Self-pay | Admitting: Rheumatology

## 2017-07-03 ENCOUNTER — Ambulatory Visit: Payer: BC Managed Care – PPO | Admitting: Rheumatology

## 2017-07-03 VITALS — BP 129/86 | HR 59 | Resp 16 | Ht 64.0 in | Wt 125.0 lb

## 2017-07-03 DIAGNOSIS — M19041 Primary osteoarthritis, right hand: Secondary | ICD-10-CM | POA: Diagnosis not present

## 2017-07-03 DIAGNOSIS — Z8659 Personal history of other mental and behavioral disorders: Secondary | ICD-10-CM | POA: Diagnosis not present

## 2017-07-03 DIAGNOSIS — M7918 Myalgia, other site: Secondary | ICD-10-CM | POA: Diagnosis not present

## 2017-07-03 DIAGNOSIS — Z8679 Personal history of other diseases of the circulatory system: Secondary | ICD-10-CM | POA: Diagnosis not present

## 2017-07-03 DIAGNOSIS — Z79899 Other long term (current) drug therapy: Secondary | ICD-10-CM | POA: Diagnosis not present

## 2017-07-03 DIAGNOSIS — Z862 Personal history of diseases of the blood and blood-forming organs and certain disorders involving the immune mechanism: Secondary | ICD-10-CM

## 2017-07-03 DIAGNOSIS — Z840 Family history of diseases of the skin and subcutaneous tissue: Secondary | ICD-10-CM | POA: Diagnosis not present

## 2017-07-03 DIAGNOSIS — M170 Bilateral primary osteoarthritis of knee: Secondary | ICD-10-CM

## 2017-07-03 DIAGNOSIS — Z8639 Personal history of other endocrine, nutritional and metabolic disease: Secondary | ICD-10-CM

## 2017-07-03 DIAGNOSIS — Z8261 Family history of arthritis: Secondary | ICD-10-CM

## 2017-07-03 DIAGNOSIS — M359 Systemic involvement of connective tissue, unspecified: Secondary | ICD-10-CM

## 2017-07-03 DIAGNOSIS — M19042 Primary osteoarthritis, left hand: Secondary | ICD-10-CM

## 2017-07-03 DIAGNOSIS — D8989 Other specified disorders involving the immune mechanism, not elsewhere classified: Secondary | ICD-10-CM

## 2017-07-03 NOTE — Patient Instructions (Addendum)
Standing Labs We placed an order today for your standing lab work.    Please come back and get your standing labs in March  CBC, CMP, UA, ESR, C3 C4, dsDNA  We have open lab Monday through Friday from 8:30-11:30 AM and 1:30-4 PM at the office of Dr. [name].   The office is located at [address] [place], [place], [place] [address] No appointment is necessary.   Labs are drawn by [facility].  You may receive a bill from [facility] for your lab work. If you have any questions regarding directions or hours of operation,  please call [phone].     Hand Exercises Hand exercises can be helpful to almost anyone. These exercises can strengthen the hands, improve flexibility and movement, and increase blood flow to the hands. These results can make work and daily tasks easier. Hand exercises can be especially helpful for people who have joint pain from arthritis or have nerve damage from overuse (carpal tunnel syndrome). These exercises can also help people who have injured a hand. Most of these hand exercises are fairly gentle stretching routines. You can do them often throughout the day. Still, it is a good idea to ask your health care provider which exercises would be best for you. Warming your hands before exercise may help to reduce stiffness. You can do this with gentle massage or by placing your hands in warm water for 15 minutes. Also, make sure you pay attention to your level of hand pain as you begin an exercise routine. Exercises Knuckle Bend Repeat this exercise 5-10 times with each hand. 1. Stand or sit with your arm, hand, and all five fingers pointed straight up. Make sure your wrist is straight. 2. Gently and slowly bend your fingers down and inward until the tips of your fingers are touching the tops of your palm. 3. Hold this position for a few seconds. 4. Extend your fingers out to their original position, all pointing straight up again.  Finger Fan Repeat this  exercise 5-10 times with each hand. 1. Hold your arm and hand out in front of you. Keep your wrist straight. 2. Squeeze your hand into a fist. 3. Hold this position for a few seconds. 4. Edison Simon out, or spread apart, your hand and fingers as much as possible, stretching every joint fully.  Tabletop Repeat this exercise 5-10 times with each hand. 1. Stand or sit with your arm, hand, and all five fingers pointed straight up. Make sure your wrist is straight. 2. Gently and slowly bend your fingers at the knuckles where they meet the hand until your hand is making an upside-down L shape. Your fingers should form a tabletop. 3. Hold this position for a few seconds. 4. Extend your fingers out to their original position, all pointing straight up again.  Making Os Repeat this exercise 5-10 times with each hand. 1. Stand or sit with your arm, hand, and all five fingers pointed straight up. Make sure your wrist is straight. 2. Make an O shape by touching your pointer finger to your thumb. Hold for a few seconds. Then open your hand wide. 3. Repeat this motion with each finger on your hand.  Table Spread Repeat this exercise 5-10 times with each hand. 1. Place your hand on a table with your palm facing down. Make sure your wrist is straight. 2. Spread your fingers out as much as possible. Hold this position for a few seconds. 3. Slide your fingers back together  again. Hold for a few seconds.  Ball Grip  Repeat this exercise 10-15 times with each hand. 1. Hold a tennis ball or another soft ball in your hand. 2. While slowly increasing pressure, squeeze the ball as hard as possible. 3. Squeeze as hard as you can for 3-5 seconds. 4. Relax and repeat.  Wrist Curls Repeat this exercise 10-15 times with each hand. 1. Sit in a chair that has armrests. 2. Hold a light weight in your hand, such as a dumbbell that weighs 1-3 pounds (0.5-1.4 kg). Ask your health care provider what weight would be best for  you. 3. Rest your hand just over the end of the chair arm with your palm facing up. 4. Gently pivot your wrist up and down while holding the weight. Do not twist your wrist from side to side.  Contact a health care provider if:  Your hand pain or discomfort gets much worse when you do an exercise.  Your hand pain or discomfort does not improve within 2 hours after you exercise. If you have any of these problems, stop doing these exercises right away. Do not do them again unless your health care provider says that you can. Get help right away if:  You develop sudden, severe hand pain. If this happens, stop doing these exercises right away. Do not do them again unless your health care provider says that you can. This information is not intended to replace advice given to you by your health care provider. Make sure you discuss any questions you have with your health care provider. Document Released: 05/25/2015 Document Revised: 11/19/2015 Document Reviewed: 12/22/2014 Elsevier Interactive Patient Education  [employer].

## 2017-07-05 ENCOUNTER — Ambulatory Visit: Payer: BC Managed Care – PPO | Admitting: Medical

## 2017-07-05 ENCOUNTER — Encounter: Payer: Self-pay | Admitting: Medical

## 2017-07-05 DIAGNOSIS — Z636 Dependent relative needing care at home: Secondary | ICD-10-CM

## 2017-07-05 DIAGNOSIS — K5909 Other constipation: Secondary | ICD-10-CM

## 2017-07-05 DIAGNOSIS — L729 Follicular cyst of the skin and subcutaneous tissue, unspecified: Secondary | ICD-10-CM | POA: Diagnosis not present

## 2017-07-05 DIAGNOSIS — M7918 Myalgia, other site: Secondary | ICD-10-CM | POA: Diagnosis not present

## 2017-07-05 MED ORDER — LINACLOTIDE 290 MCG PO CAPS: 290.0000 ug | ORAL_CAPSULE | Freq: Every day | ORAL | 2 refills | Status: DC

## 2017-07-05 NOTE — Progress Notes (Signed)
Subjective: Chief Complaint  Patient presents with  . Mass   Here for recheck on leg pain and lumps.  I saw her 06/28/17 for same.   She has seen her rheumatologist since I last saw her.  After discussing the leg fullness and lump she was reassured that there is nothing worrisome so also declined the MRI at this point  She just noticed a lump in her back in the last few days after a friend was massaging her back.  Wants this looked at.  Not bruised not red but is new as far as she knows.  She does get deep tissue massages once monthly.  Needs refilled on Linzess. Sees GI, Dr. Collene Mares, had colonoscopy this past year.  Helps but still has a lot of constipation  Having difficulty of late with stress.  Still her parents caregiver, but has gotten harder to care for them.   Past Medical History:  Diagnosis Date  . Abnormal Pap smear 1988   treated with cryo  . Anemia 11/2010   hematology consult prior; etiology malabsorption and uterine bleeding  . Anxiety   . Gastric polyp   . GERD (gastroesophageal reflux disease)   . H/O bone density study 12/2007  . H/O hysterectomy for benign disease 5/14  . History of echocardiogram 03/2010   normal LV function, EF 60-65%, mild left atrial enlargement  . HTN (hypertension) 03/2010   hospitalization for HTN urgency  . Hyperlipemia   . Internal hemorrhoids   . Lumbar degenerative disc disease   . Migraine   . Myalgia   . Spondylosis, cervical   . Umbilical hernia   . Uterine fibroid    hx/o    Current Outpatient Medications on File Prior to Visit  Medication Sig Dispense Refill  . acetaminophen (TYLENOL) 500 MG tablet Take 1 tablet (500 mg total) by mouth every 6 (six) hours as needed. 30 tablet 0  . amLODipine (NORVASC) 5 MG tablet TAKE 1 TABLET BY MOUTH EVERY DAY 90 tablet 1  . Ascorbic Acid (VITAMIN C) 1000 MG tablet Take 1,000 mg by mouth daily.     Marland Kitchen atorvastatin (LIPITOR) 20 MG tablet TAKE 1 TABLET (20 MG TOTAL) BY MOUTH DAILY. 90 tablet 0   . B Complex Vitamins (B COMPLEX PO) Take by mouth.    . calcium carbonate (OS-CAL) 600 MG TABS Take 600 mg by mouth daily.     . [facility] ASPIRIN LOW DOSE 81 MG EC tablet TAKE 1 TABLET (81 MG TOTAL) BY MOUTH DAILY. 90 tablet 2  . diclofenac sodium (VOLTAREN) 1 % GEL Apply 4 g topically 4 (four) times daily. 100 g 0  . estradiol (VIVELLE-DOT) 0.05 MG/24HR patch Place 1 patch (0.05 mg total) onto the skin 2 (two) times a week. 8 patch 1  . fish oil-omega-3 fatty acids 1000 MG capsule Take 2 g by mouth daily.    . Ginger, Zingiber officinalis, (GINGER ROOT PO) Take by mouth.    . hydroxychloroquine (PLAQUENIL) 200 MG tablet TAKE 1 TABLET (200 MG TOTAL) BY MOUTH 2 (TWO) TIMES DAILY. MONDAY THROUGH FRIDAY 40 tablet 2  . Misc Natural Products (TART CHERRY ADVANCED PO) Take by mouth.    . Multiple Vitamin (MULTIVITAMIN) tablet Take 1 tablet by mouth daily.     Marland Kitchen NEXIUM 40 MG capsule Take 1 capsule by mouth daily. Reported on 12/02/2015    . OLIVE LEAF PO Take by mouth.    . Probiotic Product (PROBIOTIC ADVANCED PO) Take by mouth.    Marland Kitchen  TURMERIC PO Take by mouth.     No current facility-administered medications on file prior to visit.    ROS as in subjective    Objective: LMP 10/25/2012 (Approximate)   BP Readings from Last 3 Encounters:  07/03/17 129/86  06/28/17 (!) 148/100  06/26/17 118/75   Gen: wd, wn, nad Her legs in general were nontender, no obvious swelling, no obvious mass or nodule.  No obvious varicosities.  She has 1+ pedal pulses She has normal leg strength and sensation  Leg range of motion normal, no other obvious deformity of the legs  no edema lower extremities Left lower back just under ribs with approx 5cm round well defined mobile soft tissue mass,only tender with pressure.  C/w benign cyst.    Assessment: Encounter Diagnoses  Name Primary?  . Cyst of skin Yes  . Myofascial muscle pain   . Chronic constipation   . Caregiver burden     Plan: I reviewed her recent  rheumatology notes from 07/03/17.  Contrary to last visit, she wants to hold off on doing MRI left femur.   She is comfortable not doing scan after talking with both Dr. Estanislado Pandy and me, particular in light of her fibromyalgia and myofacial pain helping to explain the findings she is feeling in her legs.  reassured that the lump in her left low back is a benign cyst based on clinic exam.  Chronic constipation - she had updated colonoscopy 02/2017 Dr. Collene Mares.  She is taking Linzess 29mcg and needs refill which was resent.   I advised she f/u with Dr. Collene Mares since her symptoms are still not well controlled  Caregiver burden - counseled on reassessing her situation, possibly consider skilled nursing placement for parents, or getting more outside help.    Breean was seen today for mass.  Diagnoses and all orders for this visit:  Cyst of skin  Myofascial muscle pain  Chronic constipation  Caregiver burden  Other orders -     linaclotide (LINZESS) 290 MCG CAPS capsule; Take 1 capsule (290 mcg total) by mouth daily before breakfast.

## 2017-07-18 ENCOUNTER — Other Ambulatory Visit: Payer: Self-pay | Admitting: Medical

## 2017-07-18 ENCOUNTER — Other Ambulatory Visit: Payer: Self-pay

## 2017-07-18 MED ORDER — AMLODIPINE BESYLATE 5 MG PO TABS: 5.0000 mg | ORAL_TABLET | Freq: Every day | ORAL | 1 refills | Status: DC

## 2017-07-20 ENCOUNTER — Telehealth: Payer: Self-pay | Admitting: Medical

## 2017-07-20 NOTE — Telephone Encounter (Signed)
I see she is on the schedule for tomorrow (Friday)  Please call her ASAP and find out what is going on with the lump area on her back?  I assume is the same lump I felt when she was in here recently  If so, is it bigger, is it red, is a hot,  Is it painful?  If so if she thinks is infected then come in as planned.   If not, if its just a little bigger or not any bigger or just mildly different, then  let me know.  Otherwise, she may just need referral for surgeon or ultrasound.    I just do not want to bring her back if it is unchanged as this may be unnecessary.

## 2017-07-21 ENCOUNTER — Ambulatory Visit: Payer: BC Managed Care – PPO | Admitting: Medical

## 2017-07-21 ENCOUNTER — Other Ambulatory Visit: Payer: Self-pay | Admitting: Medical

## 2017-07-21 DIAGNOSIS — L729 Follicular cyst of the skin and subcutaneous tissue, unspecified: Secondary | ICD-10-CM

## 2017-07-21 MED ORDER — TRAMADOL HCL 50 MG PO TABS: 50.0000 mg | ORAL_TABLET | Freq: Four times a day (QID) | ORAL | 0 refills | Status: DC | PRN

## 2017-07-21 NOTE — Telephone Encounter (Signed)
Called pt and got more info per [facility] request, pt states it's not red or enlarged, she thinks it may be 2 now not just 1.  It's painful and lot of pressure.  States it's mentally disturbing, she just can't get relief.  Discussed with Audelia Acton and he states there is really nothing else he can do here except referral to general surgeon and can give her something for pain if needed.  She is agreeable to this.  She does want Ultram [facility].

## 2017-07-21 NOTE — Telephone Encounter (Signed)
Refer to general surgery for cyst of back  Make sure you or Mickel Baas called out Ultram.    She is allergic to other pain medications.   Not sure if she has ever used this, but if any rash, hives, swelling or reaction with this, then stop it immediatly.  otherwise use OTC analgesic medication

## 2017-07-21 NOTE — Telephone Encounter (Signed)
[facility]axed referral to [facility] and [facility]

## 2017-07-27 ENCOUNTER — Telehealth: Payer: Self-pay

## 2017-07-27 NOTE — Telephone Encounter (Signed)
Called pt to infrom her that [place] was trying to get in touch with about a up coming appt. He appointment is feb.11 2019 and she arrive at 11:00 to check in . The phone number to their office is [phone]. Thanks [employer]

## 2017-07-30 ENCOUNTER — Other Ambulatory Visit: Payer: Self-pay | Admitting: Obstetrics and Gynecology

## 2017-07-31 NOTE — Telephone Encounter (Signed)
Spoke with patient to see how she was doing on lower dose estrogen patch. She states she is doing well and only using 1 patch/week and by the end of the week has a few hot flashes. She would like to keep at 0.05mg  and not lower at this time. Sent to [place].

## 2017-07-31 NOTE — Telephone Encounter (Signed)
Script sent, she should f/u in 10/19 for an annual exam

## 2017-07-31 NOTE — Telephone Encounter (Signed)
Called patient and left message to return my call. Dr.Jertson will want to know if she is doing okay on lower dose patch. If say may decrease to 0.0375mg .  Medication refill request: Estradiol 0.05mg  patch #8 Last AEX:  06-26-17 Next AEX: 04-19-18 Last MMG (if hormonal medication request): 05-04-17 [id number] Refill authorized: Please advise

## 2017-08-20 ENCOUNTER — Other Ambulatory Visit: Payer: Self-pay | Admitting: Medical

## 2017-08-21 NOTE — Telephone Encounter (Signed)
Looks like this was already done on 07/05/17 # 30 with 2 refills. Denying med

## 2017-08-28 ENCOUNTER — Other Ambulatory Visit: Payer: Self-pay

## 2017-08-28 ENCOUNTER — Telehealth: Payer: Self-pay | Admitting: Medical

## 2017-08-28 MED ORDER — LINACLOTIDE 290 MCG PO CAPS: 290.0000 ug | ORAL_CAPSULE | Freq: Every day | ORAL | 0 refills | Status: DC

## 2017-08-28 NOTE — Telephone Encounter (Signed)
Shari Lawrence called and spoke with pt she said that she has  1 refill left on her meds . And wanted and  Another sent in to put on hold. Sent in 30 day  Supply.

## 2017-08-28 NOTE — Telephone Encounter (Signed)
New Message   *STAT* If patient is at the pharmacy, call can be transferred to refill team.   1. Which medications need to be refilled? (please list name of each medication and dose if known)  Linzess Cap 290 mcg  2. Which pharmacy/location (including street and city if local pharmacy) is medication to be sent to? [facility] [phone], [place], [place]  3. Do they need a 30 day or 90 day supply?  30 day supply

## 2017-09-07 ENCOUNTER — Other Ambulatory Visit: Payer: Self-pay | Admitting: Medical

## 2017-09-11 ENCOUNTER — Other Ambulatory Visit: Payer: Self-pay | Admitting: Rheumatology

## 2017-09-11 NOTE — Telephone Encounter (Signed)
Last Visit: 07/03/17 Next Visit: 12/06/17 Labs: 04/11/17 WNL PLQ Eye Exam: 12/06/16 WNL  Okay to refill per Dr. Estanislado Pandy

## 2017-10-24 ENCOUNTER — Ambulatory Visit: Payer: BC Managed Care – PPO | Admitting: Rheumatology

## 2017-11-10 ENCOUNTER — Telehealth: Payer: Self-pay | Admitting: Obstetrics and Gynecology

## 2017-11-10 NOTE — Telephone Encounter (Signed)
Spoke with patient. Currently on Vivelle dot patch 0.05 mg twice wkly. Has been trying to wean off, reports increased hot flashes for the past 2 wks. States she may need to increase patch back up.   Denies any other GYN symptoms or pain.   Recommended OV for further evaluation and discussion of HRT. OV scheduled for 12/01/17 at 1:45pm with Dr. Talbert Nan. Patient declined earlier appt. Aware to return call to office if earlier appointment needed, Dr. Talbert Nan will review, I will return call with any additional recommendations.  Routing to provider for final review. Patient is agreeable to disposition. Will close encounter.

## 2017-11-10 NOTE — Telephone Encounter (Signed)
Patient stated that they were trying to reduce the dosage of medication and she is having hot flashes really bad. Stated that her hair is wet.

## 2017-11-23 NOTE — Progress Notes (Deleted)
Office Visit Note  Patient: Shari Lawrence             Date of Birth: 1960/10/26           MRN: [id number]             PCP: Carlena Hurl, PA-C Referring: Carlena Hurl, PA-C Visit Date: 12/06/2017 Occupation: @GUAROCC @    Subjective:  No chief complaint on file.   History of Present Illness: Shari Lawrence is a 57 y.o. female ***   Activities of Daily Living:  Patient reports morning stiffness for *** {minute/hour:19697}.   Patient {ACTIONS;DENIES/REPORTS:21021675::"Denies"} nocturnal pain.  Difficulty dressing/grooming: {ACTIONS;DENIES/REPORTS:21021675::"Denies"} Difficulty climbing stairs: {ACTIONS;DENIES/REPORTS:21021675::"Denies"} Difficulty getting out of chair: {ACTIONS;DENIES/REPORTS:21021675::"Denies"} Difficulty using hands for taps, buttons, cutlery, and/or writing: {ACTIONS;DENIES/REPORTS:21021675::"Denies"}   No Rheumatology ROS completed.   PMFS History:  Patient Active Problem List   Diagnosis Date Noted  . Caregiver burden 07/05/2017  . Cyst of skin 07/05/2017  . Transient elevated blood pressure 06/28/2017  . Leg pain, diffuse, left 06/28/2017  . Leg swelling 06/28/2017  . Bruising 06/28/2017  . Myofascial muscle pain 06/28/2017  . High risk medication use 03/30/2017  . Chronic constipation 11/17/2016  . Globus sensation 11/17/2016  . Primary osteoarthritis of both knees 10/05/2016  . Autoimmune disease ([place]) 09/29/2016  . Primary osteoarthritis of both hands 09/29/2016  . Family history of lupus erythematosus 09/20/2016  . Polyarthralgia 05/06/2016  . Clenching of teeth 05/06/2016  . Finger swelling 05/06/2016  . Morning stiffness of joints 05/06/2016  . Myalgia 05/06/2016  . Family history of rheumatoid arthritis 05/06/2016  . Essential hypertension 12/23/2014  . Hyperlipidemia 12/23/2014  . History of anemia 12/23/2014  . Care provider for parents 12/23/2014  . Impaired fasting glucose 10/25/2011  . Gluten intolerance 10/25/2011  .  Gastroesophageal reflux disease without esophagitis 10/25/2011  . Anxiety state   . Iron deficiency anemia     Past Medical History:  Diagnosis Date  . Abnormal Pap smear 1988   treated with cryo  . Anemia 11/2010   hematology consult prior; etiology malabsorption and uterine bleeding  . Anxiety   . Gastric polyp   . GERD (gastroesophageal reflux disease)   . H/O bone density study 12/2007  . H/O hysterectomy for benign disease 5/14  . History of echocardiogram 03/2010   normal LV function, EF 60-65%, mild left atrial enlargement  . HTN (hypertension) 03/2010   hospitalization for HTN urgency  . Hyperlipemia   . Internal hemorrhoids   . Lumbar degenerative disc disease   . Migraine   . Myalgia   . Spondylosis, cervical   . Umbilical hernia   . Uterine fibroid    hx/o     Family History  Problem Relation Age of Onset  . Hypertension Mother   . Dementia Mother   . Colon cancer Mother 9  . Deep vein thrombosis Father   . Hypertension Father   . Cancer Father        prostate CA  . Heart disease Father 34       CAD  . Hypertension Sister   . Hyperlipidemia Sister   . Hypertension Brother   . Hyperlipidemia Brother   . Hypertension Maternal Aunt   . Hyperlipidemia Maternal Aunt   . Rheum arthritis Maternal Aunt   . Diabetes Maternal Aunt        1 with type II, 1 with type 1  . Hypertension Brother   . Rheum arthritis Brother   .  Diabetes Paternal Aunt        type II   Past Surgical History:  Procedure Laterality Date  . CERVICAL FUSION  2010  . CERVIX LESION DESTRUCTION  1988  . CESAREAN SECTION    . CHOLECYSTECTOMY  2003  . COLONOSCOPY  08/2010   Dr. Collene Mares  . COLONOSCOPY  02/2017   Dr. Collene Mares  . ESOPHAGOGASTRODUODENOSCOPY  08/2010   Dr. Collene Mares  . EXPLORATORY LAPAROTOMY    . HERNIA REPAIR    . MYOMECTOMY  1998  . PELVIC LAPAROSCOPY    . ROBOTIC ASSISTED LAPAROSCOPIC HYSTERECTOMY AND SALPINGECTOMY  10/2012   [facility]; laproscopic due to fibroids  . 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  Has 57 yo daughter.  Her siblings and her don't agree on helping take care of their parents, causes family tension.  As of 11/2014     Objective: Vital Signs: LMP 10/25/2012 (Approximate)    Physical Exam   Musculoskeletal Exam: ***  CDAI Exam: No CDAI exam completed.    Investigation: No additional findings.PLQ eye exam: 12/06/2016 CBC Latest Ref Rng & Units 04/11/2017 01/04/2017 08/30/2016  WBC 3.8 - 10.8 Thousand/uL 4.8 4.1 4.3  Hemoglobin 11.7 - 15.5 g/dL 12.2 12.2 12.4  Hematocrit 35.0 - 45.0 % 37.7 38.0 38.4  Platelets 140 - 400 Thousand/uL 184 175 202   CMP Latest Ref Rng & Units 04/11/2017 01/04/2017 08/30/2016  Glucose 65 - 99 mg/dL 88 67 66  BUN 7 - 25 mg/dL 9 12 9   Creatinine 0.50 - 1.05 mg/dL 0.89 0.87 0.82  Sodium 135 - 146 mmol/L 140 140 141  Potassium 3.5 - 5.3 mmol/L 4.4 4.2 4.0  Chloride 98 - 110 mmol/L 105 106 103  CO2 20 - 32 mmol/L 26 24 23   Calcium 8.6 - 10.4 mg/dL 9.7 9.4 9.4  Total Protein 6.1 - 8.1 g/dL 7.0 6.4 7.2  Total Bilirubin 0.2 - 1.2 mg/dL 0.4 0.3 0.3  Alkaline Phos 33 - 130 U/L - 52 52  AST 10 - 35 U/L 22 18 26   ALT 6 - 29 U/L 21 13 25     Imaging: No results found.  Speciality Comments: No specialty comments available.    Procedures:  No procedures performed Allergies: Quinolones; Latex; Penicillins; Percocet [oxycodone-acetaminophen]; Sulfa drugs cross reactors; Vicodin [hydrocodone-acetaminophen]; and Gluten meal   Assessment / Plan:     Visit Diagnoses: No diagnosis found.    Orders: No orders of the defined types were placed in this encounter.  No orders of the defined types were placed  in this encounter.   Face-to-face time spent with patient was *** minutes. 50% of time was spent in counseling and coordination of care.  Follow-Up Instructions: No follow-ups on file.   Earnestine Mealing, CMA  Note - This record has been created using Editor, commissioning.  Chart creation errors have been sought, but may not always  have been located. Such creation errors do not reflect on  the standard of medical care.

## 2017-11-30 NOTE — Progress Notes (Signed)
GYNECOLOGY  VISIT   HPI: 57 y.o.   Divorced  [place] American  female   [place] with Patient's last menstrual period was 10/25/2012 (approximate).   here to discuss HRT, h/o hysterectomy. Patient still with hot flashes. She is currently on the 0.05 mg vivelle dot. Was weaned down from the 0.1 mg patch, to the 0.075 mg patch to the current dose. Last adjustment was 6 months ago.  She is not remembering to change her patch regularly in the last few weeks. She is having hot flashes and night sweats, mostly tolerable.  Her Mom died last week, she had dementia, died of aspiration pneumonia. Her Mom lived with her. Her Dad is still with her, has Parkinson and some dementia.  She has reached out to a counselor. Her daughter and friend are spending the night.  She feels like she goes in and out of depression. Some anxiety. She is having trouble sleeping. She has tried [employer] in the past, had immediate bad reaction. She tried Zoloft and prozac.   GYNECOLOGIC HISTORY: Patient's last menstrual period was 10/25/2012 (approximate). Contraception:Hysterectomy Menopausal hormone therapy: Vivelle Dot        OB History    Gravida  1   Para  1   Term  1   Preterm  0   AB  0   Living  1     SAB  0   TAB  0   Ectopic  0   Multiple  0   Live Births  1              Patient Active Problem List   Diagnosis Date Noted  . Caregiver burden 07/05/2017  . Cyst of skin 07/05/2017  . Transient elevated blood pressure 06/28/2017  . Leg pain, diffuse, left 06/28/2017  . Leg swelling 06/28/2017  . Bruising 06/28/2017  . Myofascial muscle pain 06/28/2017  . High risk medication use 03/30/2017  . Chronic constipation 11/17/2016  . Globus sensation 11/17/2016  . Primary osteoarthritis of both knees 10/05/2016  . Autoimmune disease ([place]) 09/29/2016  . Primary osteoarthritis of both hands 09/29/2016  . Family history of lupus erythematosus 09/20/2016  . Polyarthralgia 05/06/2016  . Clenching  of teeth 05/06/2016  . Finger swelling 05/06/2016  . Morning stiffness of joints 05/06/2016  . Myalgia 05/06/2016  . Family history of rheumatoid arthritis 05/06/2016  . Essential hypertension 12/23/2014  . Hyperlipidemia 12/23/2014  . History of anemia 12/23/2014  . Care provider for parents 12/23/2014  . Impaired fasting glucose 10/25/2011  . Gluten intolerance 10/25/2011  . Gastroesophageal reflux disease without esophagitis 10/25/2011  . Anxiety state   . Iron deficiency anemia     Past Medical History:  Diagnosis Date  . Abnormal Pap smear 1988   treated with cryo  . Anemia 11/2010   hematology consult prior; etiology malabsorption and uterine bleeding  . Anxiety   . Gastric polyp   . GERD (gastroesophageal reflux disease)   . H/O bone density study 12/2007  . H/O hysterectomy for benign disease 5/14  . History of echocardiogram 03/2010   normal LV function, EF 60-65%, mild left atrial enlargement  . HTN (hypertension) 03/2010   hospitalization for HTN urgency  . Hyperlipemia   . Internal hemorrhoids   . Lumbar degenerative disc disease   . Migraine   . Myalgia   . Spondylosis, cervical   . Umbilical hernia   . Uterine fibroid    hx/o     Past Surgical History:  Procedure Laterality Date  . CERVICAL FUSION  2010  . CERVIX LESION DESTRUCTION  1988  . CESAREAN SECTION    . CHOLECYSTECTOMY  2003  . COLONOSCOPY  08/2010   Dr. Collene Mares  . COLONOSCOPY  02/2017   Dr. Collene Mares  . ESOPHAGOGASTRODUODENOSCOPY  08/2010   Dr. Collene Mares  . EXPLORATORY LAPAROTOMY    . HERNIA REPAIR    . MYOMECTOMY  1998  . PELVIC LAPAROSCOPY    . ROBOTIC ASSISTED LAPAROSCOPIC HYSTERECTOMY AND SALPINGECTOMY  10/2012   [facility]; laproscopic due to fibroids  . UMBILICAL HERNIA REPAIR  10/2013   infected laparoscopic port, mesh placement    Current Outpatient Medications  Medication Sig Dispense Refill  . acetaminophen (TYLENOL) 500 MG tablet Take 1 tablet (500 mg total) by mouth every 6 (six) hours as  needed. 30 tablet 0  . amLODipine (NORVASC) 5 MG tablet Take 1 tablet (5 mg total) by mouth daily. 90 tablet 1  . Ascorbic Acid (VITAMIN C) 1000 MG tablet Take 1,000 mg by mouth daily.     Marland Kitchen atorvastatin (LIPITOR) 20 MG tablet TAKE 1 TABLET (20 MG TOTAL) BY MOUTH DAILY. 90 tablet 0  . B Complex Vitamins (B COMPLEX PO) Take by mouth.    . calcium carbonate (OS-CAL) 600 MG TABS Take 600 mg by mouth daily.     . [facility] ASPIRIN LOW DOSE 81 MG EC tablet TAKE 1 TABLET (81 MG TOTAL) BY MOUTH DAILY. 90 tablet 2  . dexlansoprazole (DEXILANT) 60 MG capsule Take 1 capsule by mouth daily.    . diclofenac sodium (VOLTAREN) 1 % GEL Apply 4 g topically 4 (four) times daily. 100 g 0  . estradiol (VIVELLE-DOT) 0.05 MG/24HR patch PLACE 1 PATCH (0.05 MG TOTAL) ONTO THE SKIN 2 (TWO) TIMES A WEEK. 24 patch 2  . fish oil-omega-3 fatty acids 1000 MG capsule Take 2 g by mouth daily.    . Ginger, Zingiber officinalis, (GINGER ROOT PO) Take by mouth.    . hydroxychloroquine (PLAQUENIL) 200 MG tablet TAKE 1 TABLET (200 MG TOTAL) BY MOUTH 2 (TWO) TIMES DAILY. MONDAY THROUGH FRIDAY 40 tablet 2  . linaclotide (LINZESS) 290 MCG CAPS capsule Take 1 capsule (290 mcg total) by mouth daily before breakfast. 30 capsule 0  . Misc Natural Products (TART CHERRY ADVANCED PO) Take by mouth.    . Multiple Vitamin (MULTIVITAMIN) tablet Take 1 tablet by mouth daily.     Marland Kitchen NEXIUM 40 MG capsule Take 1 capsule by mouth daily. Reported on 12/02/2015    . OLIVE LEAF PO Take by mouth.    . Probiotic Product (PROBIOTIC ADVANCED PO) Take by mouth.    . TURMERIC PO Take by mouth.     No current facility-administered medications for this visit.      ALLERGIES: Quinolones; Latex; Levaquin [levofloxacin]; Penicillins; Percocet [oxycodone-acetaminophen]; Sulfa drugs cross reactors; Vicodin [hydrocodone-acetaminophen]; and Gluten meal  Family History  Problem Relation Age of Onset  . Hypertension Mother   . Dementia Mother   . Colon cancer Mother  68  . Deep vein thrombosis Father   . Hypertension Father   . Cancer Father        prostate CA  . Heart disease Father 30       CAD  . Hypertension Sister   . Hyperlipidemia Sister   . Hypertension Brother   . Hyperlipidemia Brother   . Hypertension Maternal Aunt   . Hyperlipidemia Maternal Aunt   . Rheum arthritis Maternal Aunt   .  Diabetes Maternal Aunt        1 with type II, 1 with type 1  . Hypertension Brother   . Rheum arthritis Brother   . Diabetes Paternal Aunt        type II    Social History   Socioeconomic History  . Marital status: Divorced    Spouse name: Not on file  . Number of children: 1  . Years of education: Not on file  . Highest education level: Not on file  Occupational History    Employer: [employer]    Comment: molecular Production designer, theatre/television/film, PhD candidate, [employer]  Social Needs  . Financial resource strain: Not on file  . Food insecurity:    Worry: Not on file    Inability: Not on file  . Transportation needs:    Medical: Not on file    Non-medical: Not on file  Tobacco Use  . Smoking status: Never Smoker  . Smokeless tobacco: Never Used  Substance and Sexual Activity  . Alcohol use: No  . Drug use: No  . Sexual activity: Never    Partners: Male    Birth control/protection: Surgical  Lifestyle  . Physical activity:    Days per week: Not on file    Minutes per session: Not on file  . Stress: Not on file  Relationships  . Social connections:    Talks on phone: Not on file    Gets together: Not on file    Attends religious service: Not on file    Active member of club or organization: Not on file    Attends meetings of clubs or organizations: Not on file    Relationship status: Not on file  . Intimate partner violence:    Fear of current or ex partner: Not on file    Emotionally abused: Not on file    Physically abused: Not on file    Forced sexual activity: Not on file  Other Topics Concern  . 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  Has 38 yo daughter.  Her siblings and her don't agree on helping take care of their parents, causes family tension.  As of 11/2014    Review of Systems  Constitutional: Negative.   HENT: Negative.   Eyes: Negative.   Respiratory: Negative.   Cardiovascular: Negative.   Gastrointestinal: Negative.   Genitourinary: Negative.   Musculoskeletal: Negative.   Skin: Negative.   Neurological: Negative.   Endo/Heme/Allergies: Bruises/bleeds easily.  Psychiatric/Behavioral: Negative.     PHYSICAL EXAMINATION:    BP 122/82 (BP Location: Right Arm, Patient Position: Sitting, Cuff Size: Normal)   Pulse 60   Ht 5\' 4"  (1.626 m)   Wt 122 lb 9.6 oz (55.6 kg)   LMP 10/25/2012 (Approximate)   BMI 21.04 kg/m     General appearance: alert, cooperative and appears stated age. Appears sad, intermittently teary.   ASSESSMENT Situational depression and anxiety with the death of her Mother. Previously she hasn't tolerated prozac or zoloft Not sleeping well ERT, having vasomotor symptoms on her current dose of ERT, overall tolerable.    PLAN Start lexapro, just 5 mg a day, if she is tolerating after a few weeks, she can increase to 10 mg a day Xanax short term  for sleep (will not refill) Name of therapist given Will not make any adjustment in her ERT at this time. At her annual exam in 12/19 will consider a lower dose F/U in one month, sooner with concerns   An After Visit Summary was printed and given to the patient.  ~20 minutes face to face time of which over 50% was spent in counseling.

## 2017-12-01 ENCOUNTER — Encounter: Payer: Self-pay | Admitting: Obstetrics and Gynecology

## 2017-12-01 ENCOUNTER — Ambulatory Visit: Payer: BC Managed Care – PPO | Admitting: Obstetrics and Gynecology

## 2017-12-01 VITALS — BP 122/82 | HR 60 | Ht 64.0 in | Wt 122.6 lb

## 2017-12-01 DIAGNOSIS — F418 Other specified anxiety disorders: Secondary | ICD-10-CM

## 2017-12-01 DIAGNOSIS — F4321 Adjustment disorder with depressed mood: Secondary | ICD-10-CM | POA: Diagnosis not present

## 2017-12-01 DIAGNOSIS — R232 Flushing: Secondary | ICD-10-CM

## 2017-12-01 DIAGNOSIS — N951 Menopausal and female climacteric states: Secondary | ICD-10-CM | POA: Diagnosis not present

## 2017-12-01 MED ORDER — ALPRAZOLAM 0.5 MG PO TABS: 0.5000 mg | ORAL_TABLET | Freq: Every evening | ORAL | 0 refills | Status: DC | PRN

## 2017-12-01 MED ORDER — ESCITALOPRAM OXALATE 10 MG PO TABS: ORAL_TABLET | ORAL | 1 refills | Status: DC

## 2017-12-05 ENCOUNTER — Other Ambulatory Visit: Payer: Self-pay | Admitting: Medical

## 2017-12-06 ENCOUNTER — Ambulatory Visit: Payer: BC Managed Care – PPO | Admitting: Rheumatology

## 2017-12-14 ENCOUNTER — Ambulatory Visit (INDEPENDENT_AMBULATORY_CARE_PROVIDER_SITE_OTHER): Payer: BC Managed Care – PPO | Admitting: Licensed Clinical Social Worker

## 2017-12-14 DIAGNOSIS — F331 Major depressive disorder, recurrent, moderate: Secondary | ICD-10-CM | POA: Diagnosis not present

## 2017-12-29 NOTE — Progress Notes (Signed)
GYNECOLOGY  VISIT   HPI: 57 y.o.   Divorced  [place] American  female   [place] with Patient's last menstrual period was 10/25/2012 (approximate).   here for 1 month medication recheck. Started on lexapro 5 mg last month for situational depression and anxiety. Her mother died 5 weeks ago. Still having increased anxiety. She has good days and bad days, has been feeling a little better in the last few days. She hasn't been taking the lexapro daily, taking it every other day. She has started counseling, one session so far, doesn't think it will help. She is going to reach out to a grief support group.  She has lost 4 lbs. Trying to eat. Just started to exercise again. Trying to get a routine back.  She has been worried about the side effects of lexapro. Dry mouth, feeling like a zombie. No thoughts of hurting herself or others.  Her daughter and friend are supportive.  Reports urinary frequency that started at the end of last week. She voids small amounts, has some urgency to void. Denies pain, fever, or chills. She drinks 1/2 a cup of coffee a day.  Stomach feels bloated. Not digesting food well. BM every 1-2 days.   GYNECOLOGIC HISTORY: Patient's last menstrual period was 10/25/2012 (approximate). Contraception: Hysterectomy Menopausal hormone therapy: Vivelle Dot        OB History    Gravida  1   Para  1   Term  1   Preterm  0   AB  0   Living  1     SAB  0   TAB  0   Ectopic  0   Multiple  0   Live Births  1              Patient Active Problem List   Diagnosis Date Noted  . Caregiver burden 07/05/2017  . Cyst of skin 07/05/2017  . Transient elevated blood pressure 06/28/2017  . Leg pain, diffuse, left 06/28/2017  . Leg swelling 06/28/2017  . Bruising 06/28/2017  . Myofascial muscle pain 06/28/2017  . High risk medication use 03/30/2017  . Chronic constipation 11/17/2016  . Globus sensation 11/17/2016  . Primary osteoarthritis of both knees 10/05/2016  .  Autoimmune disease ([place]) 09/29/2016  . Primary osteoarthritis of both hands 09/29/2016  . Family history of lupus erythematosus 09/20/2016  . Polyarthralgia 05/06/2016  . Clenching of teeth 05/06/2016  . Finger swelling 05/06/2016  . Morning stiffness of joints 05/06/2016  . Myalgia 05/06/2016  . Family history of rheumatoid arthritis 05/06/2016  . Essential hypertension 12/23/2014  . Hyperlipidemia 12/23/2014  . History of anemia 12/23/2014  . Care provider for parents 12/23/2014  . Impaired fasting glucose 10/25/2011  . Gluten intolerance 10/25/2011  . Gastroesophageal reflux disease without esophagitis 10/25/2011  . Anxiety state   . Iron deficiency anemia     Past Medical History:  Diagnosis Date  . Abnormal Pap smear 1988   treated with cryo  . Anemia 11/2010   hematology consult prior; etiology malabsorption and uterine bleeding  . Anxiety   . Gastric polyp   . GERD (gastroesophageal reflux disease)   . H/O bone density study 12/2007  . H/O hysterectomy for benign disease 5/14  . History of echocardiogram 03/2010   normal LV function, EF 60-65%, mild left atrial enlargement  . HTN (hypertension) 03/2010   hospitalization for HTN urgency  . Hyperlipemia   . Internal hemorrhoids   . Lumbar degenerative disc disease   .  Migraine   . Myalgia   . Spondylosis, cervical   . Umbilical hernia   . Uterine fibroid    hx/o     Past Surgical History:  Procedure Laterality Date  . CERVICAL FUSION  2010  . CERVIX LESION DESTRUCTION  1988  . CESAREAN SECTION    . CHOLECYSTECTOMY  2003  . COLONOSCOPY  08/2010   Dr. Collene Mares  . COLONOSCOPY  02/2017   Dr. Collene Mares  . ESOPHAGOGASTRODUODENOSCOPY  08/2010   Dr. Collene Mares  . EXPLORATORY LAPAROTOMY    . HERNIA REPAIR    . MYOMECTOMY  1998  . PELVIC LAPAROSCOPY    . ROBOTIC ASSISTED LAPAROSCOPIC HYSTERECTOMY AND SALPINGECTOMY  10/2012   [facility]; laproscopic due to fibroids  . UMBILICAL HERNIA REPAIR  10/2013   infected laparoscopic port, mesh  placement    Current Outpatient Medications  Medication Sig Dispense Refill  . acetaminophen (TYLENOL) 500 MG tablet Take 1 tablet (500 mg total) by mouth every 6 (six) hours as needed. 30 tablet 0  . ALPRAZolam (XANAX) 0.5 MG tablet Take 1 tablet (0.5 mg total) by mouth at bedtime as needed for anxiety. 30 tablet 0  . amLODipine (NORVASC) 5 MG tablet Take 1 tablet (5 mg total) by mouth daily. 90 tablet 1  . Ascorbic Acid (VITAMIN C) 1000 MG tablet Take 1,000 mg by mouth daily.     Marland Kitchen atorvastatin (LIPITOR) 20 MG tablet TAKE 1 TABLET (20 MG TOTAL) BY MOUTH DAILY. 90 tablet 0  . B Complex Vitamins (B COMPLEX PO) Take by mouth.    . calcium carbonate (OS-CAL) 600 MG TABS Take 600 mg by mouth daily.     . [facility] ASPIRIN LOW DOSE 81 MG EC tablet TAKE 1 TABLET (81 MG TOTAL) BY MOUTH DAILY. 90 tablet 2  . dexlansoprazole (DEXILANT) 60 MG capsule Take 1 capsule by mouth daily.    . diclofenac sodium (VOLTAREN) 1 % GEL Apply 4 g topically 4 (four) times daily. 100 g 0  . escitalopram (LEXAPRO) 10 MG tablet Take 1/2 a tablet a day, if tolerating can increase to 1 tablet a day after 1-2 weeks. 30 tablet 1  . estradiol (VIVELLE-DOT) 0.05 MG/24HR patch PLACE 1 PATCH (0.05 MG TOTAL) ONTO THE SKIN 2 (TWO) TIMES A WEEK. 24 patch 2  . fish oil-omega-3 fatty acids 1000 MG capsule Take 2 g by mouth daily.    . Ginger, Zingiber officinalis, (GINGER ROOT PO) Take by mouth.    . hydroxychloroquine (PLAQUENIL) 200 MG tablet TAKE 1 TABLET (200 MG TOTAL) BY MOUTH 2 (TWO) TIMES DAILY. MONDAY THROUGH FRIDAY 40 tablet 2  . linaclotide (LINZESS) 290 MCG CAPS capsule Take 1 capsule (290 mcg total) by mouth daily before breakfast. 30 capsule 0  . Misc Natural Products (TART CHERRY ADVANCED PO) Take by mouth.    . Multiple Vitamin (MULTIVITAMIN) tablet Take 1 tablet by mouth daily.     Marland Kitchen NEXIUM 40 MG capsule Take 1 capsule by mouth daily. Reported on 12/02/2015    . OLIVE LEAF PO Take by mouth.    . Probiotic Product (PROBIOTIC  ADVANCED PO) Take by mouth.    . TURMERIC PO Take by mouth.     No current facility-administered medications for this visit.      ALLERGIES: Quinolones; Latex; Levaquin [levofloxacin]; Penicillins; Percocet [oxycodone-acetaminophen]; Sulfa drugs cross reactors; Vicodin [hydrocodone-acetaminophen]; and Gluten meal  Family History  Problem Relation Age of Onset  . Hypertension Mother   . Dementia Mother   .  Colon cancer Mother 15  . Deep vein thrombosis Father   . Hypertension Father   . Cancer Father        prostate CA  . Heart disease Father 56       CAD  . Hypertension Sister   . Hyperlipidemia Sister   . Hypertension Brother   . Hyperlipidemia Brother   . Hypertension Maternal Aunt   . Hyperlipidemia Maternal Aunt   . Rheum arthritis Maternal Aunt   . Diabetes Maternal Aunt        1 with type II, 1 with type 1  . Hypertension Brother   . Rheum arthritis Brother   . Diabetes Paternal Aunt        type II    Social History   Socioeconomic History  . Marital status: Divorced    Spouse name: Not on file  . Number of children: 1  . Years of education: Not on file  . Highest education level: Not on file  Occupational History    Employer: [employer]    Comment: molecular Production designer, theatre/television/film, PhD candidate, [employer]  Social Needs  . Financial resource strain: Not on file  . Food insecurity:    Worry: Not on file    Inability: Not on file  . Transportation needs:    Medical: Not on file    Non-medical: Not on file  Tobacco Use  . Smoking status: Never Smoker  . Smokeless tobacco: Never Used  Substance and Sexual Activity  . Alcohol use: No  . Drug use: No  . Sexual activity: Not Currently    Partners: Male    Birth control/protection: Surgical  Lifestyle  . Physical activity:    Days per week: Not on file    Minutes per session: Not on file  . Stress: Not on file  Relationships  . Social connections:    Talks on phone: Not on file    Gets together:  Not on file    Attends religious service: Not on file    Active member of club or organization: Not on file    Attends meetings of clubs or organizations: Not on file    Relationship status: Not on file  . Intimate partner violence:    Fear of current or ex partner: Not on file    Emotionally abused: Not on file    Physically abused: Not on file    Forced sexual activity: Not on file  Other Topics Concern  . 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  Has 54 yo daughter.  Her siblings and her don't agree on helping take care of their parents, causes family tension.  As of 11/2014    Review of Systems  Constitutional: Negative.   HENT: Negative.   Eyes: Negative.   Respiratory: Negative.   Cardiovascular: Negative.   Gastrointestinal:       Abdominal bloating  Genitourinary: Positive for frequency and urgency.  Musculoskeletal: Negative.   Skin: Negative.   Neurological: Negative.   Endo/Heme/Allergies: Negative.   Psychiatric/Behavioral: The patient is nervous/anxious.     PHYSICAL EXAMINATION:    BP 123/60 (BP Location: Right Arm, Patient Position: Sitting)   Pulse 60   Wt 118 lb 12.8 oz (53.9 kg)   LMP 10/25/2012 (Approximate)  BMI 20.39 kg/m     General appearance: alert, cooperative and appears stated age  ASSESSMENT Depression anxiety, stable, not worse. Not taking lexapro regularly Urinary frequency and urgency, +blood on dip.    PLAN She will try taking the 5 mg of lexapro daily, can increase to 10 mg a day if she feels it will be helpful. She will call if she needs a refill.  We have discussed other medications, she hasn't tolerated prozac or zoloft before Send urine for ua, c&s Script for Macrobid called in, she won't start it  unless her symptoms worsen Can try Azo   An After Visit Summary was printed and given to the patient.  ~20 minutes face to face time of which over 50% was spent in counseling.

## 2018-01-01 ENCOUNTER — Ambulatory Visit: Payer: BC Managed Care – PPO | Admitting: Obstetrics and Gynecology

## 2018-01-01 ENCOUNTER — Other Ambulatory Visit: Payer: Self-pay

## 2018-01-01 ENCOUNTER — Encounter: Payer: Self-pay | Admitting: Obstetrics and Gynecology

## 2018-01-01 VITALS — BP 123/60 | HR 60 | Wt 118.8 lb

## 2018-01-01 DIAGNOSIS — R350 Frequency of micturition: Secondary | ICD-10-CM | POA: Diagnosis not present

## 2018-01-01 DIAGNOSIS — R3915 Urgency of urination: Secondary | ICD-10-CM | POA: Diagnosis not present

## 2018-01-01 DIAGNOSIS — F418 Other specified anxiety disorders: Secondary | ICD-10-CM | POA: Diagnosis not present

## 2018-01-01 LAB — POCT URINALYSIS DIPSTICK
Blood, UA: POSITIVE
Glucose, UA: NEGATIVE
Ketones, UA: NEGATIVE
Leukocytes, UA: NEGATIVE
Nitrite, UA: NEGATIVE
Protein, UA: NEGATIVE
Urobilinogen, UA: 0.2 E.U./dL
pH, UA: 5 (ref 5.0–8.0)

## 2018-01-01 MED ORDER — NITROFURANTOIN MONOHYD MACRO 100 MG PO CAPS: 100.0000 mg | ORAL_CAPSULE | Freq: Two times a day (BID) | ORAL | 0 refills | Status: DC

## 2018-01-02 LAB — URINALYSIS, MICROSCOPIC ONLY: Casts: NONE SEEN /lpf

## 2018-01-02 LAB — URINE CULTURE

## 2018-01-03 ENCOUNTER — Telehealth: Payer: Self-pay

## 2018-01-03 ENCOUNTER — Ambulatory Visit (INDEPENDENT_AMBULATORY_CARE_PROVIDER_SITE_OTHER): Payer: BC Managed Care – PPO | Admitting: Licensed Clinical Social Worker

## 2018-01-03 DIAGNOSIS — F3341 Major depressive disorder, recurrent, in partial remission: Secondary | ICD-10-CM | POA: Diagnosis not present

## 2018-01-03 NOTE — Telephone Encounter (Signed)
She should cut back further on caffeine intake, hydrate well, if symptoms persist over the next 1-2 weeks, she should come in for an abdominal/pelvic exam.

## 2018-01-03 NOTE — Telephone Encounter (Signed)
Spoke with patient. Results given. Patient states she is still having ongoing bladder pressure and the feeling of needing to use the restroom frequently. Asking for recommendations on what to do next for evaluation.

## 2018-01-03 NOTE — Telephone Encounter (Signed)
Spoke with patient. Advised of message as seen below from Dr.Jertson. Patient verbalizes understanding. Encounter closed. 

## 2018-01-03 NOTE — Telephone Encounter (Signed)
Left message to call Janai Brannigan at [phone]. 

## 2018-01-03 NOTE — Telephone Encounter (Signed)
Patient returning Kaitlyn's call. °

## 2018-01-03 NOTE — Telephone Encounter (Signed)
-----   Message from [name], MD sent at 01/03/2018  8:31 AM EDT ----- Please let the patient know that the final report returned as negative for infection. She can stop the macrobid.

## 2018-01-04 ENCOUNTER — Other Ambulatory Visit: Payer: Self-pay | Admitting: Rheumatology

## 2018-01-05 ENCOUNTER — Other Ambulatory Visit: Payer: Self-pay

## 2018-01-05 DIAGNOSIS — Z79899 Other long term (current) drug therapy: Secondary | ICD-10-CM

## 2018-01-05 DIAGNOSIS — M359 Systemic involvement of connective tissue, unspecified: Secondary | ICD-10-CM

## 2018-01-05 NOTE — Progress Notes (Signed)
 Office Visit Note  Patient: Shari Lawrence             Date of Birth: 12/03/1960           MRN: [id number]             PCP: Tysinger, David S, PA-C Referring: Tysinger, David S, PA-C Visit Date: 01/11/2018 Occupation: @GUAROCC@  Subjective:  Neck and left arm pain.   History of Present Illness: Shari Lawrence is a 57 y.o. female with history of autoimmune disease and osteoarthritis.  She states she has been under a lot of stress.  She lost her mother about 2 months ago.  She has been eating well.  She has lost some weight.  She has been experiencing neck pain and stiffness and also some pain radiating to her left arm.  Her hands feel stiff but she denies any joint swelling.  Activities of Daily Living:  Patient reports morning stiffness for 1 hour.   Patient Reports nocturnal pain.  Difficulty dressing/grooming: Denies Difficulty climbing stairs: Reports Difficulty getting out of chair: Reports Difficulty using hands for taps, buttons, cutlery, and/or writing: Reports  Review of Systems  Constitutional: Positive for fatigue. Negative for night sweats, weight gain and weight loss.  HENT: Positive for sore tongue. Negative for mouth sores, trouble swallowing, trouble swallowing, mouth dryness and nose dryness.   Eyes: Negative for pain, redness, visual disturbance and dryness.  Respiratory: Negative for cough, shortness of breath and difficulty breathing.   Cardiovascular: Negative for chest pain, palpitations, hypertension, irregular heartbeat and swelling in legs/feet.  Gastrointestinal: Positive for constipation and diarrhea. Negative for abdominal pain and blood in stool.  Endocrine: Positive for increased urination.  Genitourinary: Negative for pelvic pain and vaginal dryness.  Musculoskeletal: Positive for arthralgias, joint pain and morning stiffness. Negative for joint swelling, myalgias, muscle weakness, muscle tenderness and myalgias.  Skin: Negative for color change, rash,  hair loss, skin tightness, ulcers and sensitivity to sunlight.  Allergic/Immunologic: Negative for susceptible to infections.  Neurological: Positive for headaches. Negative for dizziness, light-headedness, memory loss, night sweats and weakness.  Hematological: Negative for bruising/bleeding tendency and swollen glands.  Psychiatric/Behavioral: Negative for depressed mood, confusion and sleep disturbance. The patient is not nervous/anxious.     PMFS History:  Patient Active Problem List   Diagnosis Date Noted  . Caregiver burden 07/05/2017  . Cyst of skin 07/05/2017  . Transient elevated blood pressure 06/28/2017  . Leg pain, diffuse, left 06/28/2017  . Leg swelling 06/28/2017  . Bruising 06/28/2017  . Myofascial muscle pain 06/28/2017  . High risk medication use 03/30/2017  . Chronic constipation 11/17/2016  . Globus sensation 11/17/2016  . Primary osteoarthritis of both knees 10/05/2016  . Autoimmune disease (HCC) 09/29/2016  . Primary osteoarthritis of both hands 09/29/2016  . Family history of lupus erythematosus 09/20/2016  . Polyarthralgia 05/06/2016  . Clenching of teeth 05/06/2016  . Finger swelling 05/06/2016  . Morning stiffness of joints 05/06/2016  . Myalgia 05/06/2016  . Family history of rheumatoid arthritis 05/06/2016  . Essential hypertension 12/23/2014  . Hyperlipidemia 12/23/2014  . History of anemia 12/23/2014  . Care provider for parents 12/23/2014  . Impaired fasting glucose 10/25/2011  . Gluten intolerance 10/25/2011  . Gastroesophageal reflux disease without esophagitis 10/25/2011  . Anxiety state   . Iron deficiency anemia     Past Medical History:  Diagnosis Date  . Abnormal Pap smear 1988   treated with cryo  . Anemia 11/2010     hematology consult prior; etiology malabsorption and uterine bleeding  . Anxiety   . Gastric polyp   . GERD (gastroesophageal reflux disease)   . H/O bone density study 12/2007  . H/O hysterectomy for benign disease  5/14  . History of echocardiogram 03/2010   normal LV function, EF 60-65%, mild left atrial enlargement  . HTN (hypertension) 03/2010   hospitalization for HTN urgency  . Hyperlipemia   . Internal hemorrhoids   . Lumbar degenerative disc disease   . Migraine   . Myalgia   . Spondylosis, cervical   . Umbilical hernia   . Uterine fibroid    hx/o     Family History  Problem Relation Age of Onset  . Hypertension Mother   . Dementia Mother   . Colon cancer Mother 72  . Deep vein thrombosis Father   . Hypertension Father   . Cancer Father        prostate CA  . Heart disease Father 55       CAD  . Hypertension Sister   . Hyperlipidemia Sister   . Hypertension Brother   . Hyperlipidemia Brother   . Hypertension Maternal Aunt   . Hyperlipidemia Maternal Aunt   . Rheum arthritis Maternal Aunt   . Diabetes Maternal Aunt        1 with type II, 1 with type 1  . Hypertension Brother   . Rheum arthritis Brother   . Diabetes Paternal Aunt        type II   Past Surgical History:  Procedure Laterality Date  . CERVICAL FUSION  2010  . CERVIX LESION DESTRUCTION  1988  . CESAREAN SECTION    . CHOLECYSTECTOMY  2003  . COLONOSCOPY  08/2010   Dr. Mann  . COLONOSCOPY  02/2017   Dr. Mann  . ESOPHAGOGASTRODUODENOSCOPY  08/2010   Dr. Mann  . EXPLORATORY LAPAROTOMY    . HERNIA REPAIR    . MYOMECTOMY  1998  . PELVIC LAPAROSCOPY    . ROBOTIC ASSISTED LAPAROSCOPIC HYSTERECTOMY AND SALPINGECTOMY  10/2012   [facility]; laproscopic due to fibroids  . 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  Has 22 yo daughter.  Her siblings and her don't agree on helping take care of their parents,  causes family tension.  As of 11/2014    Objective: Vital Signs: BP 108/74 (BP Location: Left Arm, Patient Position: Sitting, Cuff Size: Normal)   Pulse (!) 58   Resp 12   Ht 5' 4" (1.626 m)   Wt 120 lb (54.4 kg)   LMP 10/25/2012 (Approximate)   BMI 20.60 kg/m    Physical Exam  Constitutional: She is oriented to person, place, and time. She appears well-developed and well-nourished.  HENT:  Head: Normocephalic and atraumatic.  Eyes: Conjunctivae and EOM are normal.  Neck: Normal range of motion.  Cardiovascular: Normal rate, regular rhythm, normal heart sounds and intact distal pulses.  Pulmonary/Chest: Effort normal and breath sounds normal.  Abdominal: Soft. Bowel sounds are normal.  Lymphadenopathy:    She has no cervical adenopathy.  Neurological: She is alert and oriented to person, place, and time.  Skin: Skin is warm and dry. Capillary refill takes less   than 2 seconds.  Psychiatric: She has a normal mood and affect. Her behavior is normal.  Nursing note and vitals reviewed.    Musculoskeletal Exam: Patient has discomfort range of motion of her cervical spine especially with forward flexion.  She has discomfort range of motion of her left shoulder.  Right shoulder joint was in good range of motion.  She has no synovitis over MCP joints.  Some DIP and PIP thickening was noted.  Hip joints knee joints ankles MTPs PIPs were in good range of motion with no synovitis.  She has bilateral trapezius spasm.  CDAI Exam: No CDAI exam completed.   Investigation: No additional findings.  Imaging: No results found.  Recent Labs: Lab Results  Component Value Date   WBC 4.4 01/05/2018   HGB 12.8 01/05/2018   PLT 171 01/05/2018   NA 140 01/05/2018   K 4.0 01/05/2018   CL 102 01/05/2018   CO2 27 01/05/2018   GLUCOSE 87 01/05/2018   BUN 10 01/05/2018   CREATININE 0.91 01/05/2018   BILITOT 0.5 01/05/2018   ALKPHOS 52 01/04/2017   AST 24 01/05/2018   ALT 26 01/05/2018   PROT  7.3 01/05/2018   ALBUMIN 4.0 01/04/2017   CALCIUM 10.2 01/05/2018   GFRAA 82 01/05/2018  ds  7, C3 C4 normal, ESR 9  Speciality Comments: No specialty comments available.  Procedures:  Trigger Point Inj Date/Time: 01/11/2018 12:04 PM Performed by: [name], MD Authorized by: [name], MD   Consent Given by:  Patient Site marked: the procedure site was marked   Timeout: prior to procedure the correct patient, procedure, and site was verified   Indications:  Muscle spasm and pain Total # of Trigger Points:  1 Location: neck   Needle Size:  27 G Approach:  Dorsal Medications #1:  0.5 mL lidocaine 1 %; 10 mg triamcinolone acetonide 40 MG/ML Patient tolerance:  Patient tolerated the procedure well with no immediate complications  Large Joint Inj: L glenohumeral on 01/11/2018 12:08 PM Indications: pain Details: 27 G 1.5 in needle, posterior approach  Arthrogram: No  Medications: 1 mL lidocaine 1 %; 40 mg triamcinolone acetonide 40 MG/ML Aspirate: 0 mL Outcome: tolerated well, no immediate complications Procedure, treatment alternatives, risks and benefits explained, specific risks discussed. Consent was given by the patient. Immediately prior to procedure a time out was called to verify the correct patient, procedure, equipment, support staff and site/side marked as required. Patient was prepped and draped in the usual sterile fashion.     Allergies: Quinolones; Latex; Levaquin [levofloxacin]; Penicillins; Percocet [oxycodone-acetaminophen]; Sulfa drugs cross reactors; Vicodin [hydrocodone-acetaminophen]; and Gluten meal   Assessment / Plan:     Visit Diagnoses: Autoimmune disease ([place]) - ANA 1:320 NO, ds8, RF-,CCP-,History of nasal ulcers and oral ulcers in the past, positive synovitis in left hand MCPs on [place] previously.  Currently patient has no clinical features of autoimmune disease.  Although she has been experiencing a lot of fatigue.  She recently lost her  mother and has been going to difficult time.  Her most recent labs do not show a flare of autoimmune disease.  High risk medication use - PLQ twice daily Monday through Friday.  Her labs have been stable.  She has been getting her eye exams on a regular basis.  Neck pain -she has been having increased neck pain and stiffness.  Plan: XR Cervical Spine 2 or 3 views.  Her x-rays showed postsurgical changes and no other significant changes.  She  had left trapezius spasm.  Per her request the left trapezius area was injected with cortisone and the procedure as described above.  She tolerated the procedure well.  Acute pain of left shoulder -she has been having difficulty lifting her left arm.  She states the pain has been radiating from her shoulder down into her left arm.  Plan: XR Shoulder Left was unremarkable.  After informed consent was obtained and different treatment options were discussed the left glenohumeral joint was injected as described above.  She tolerated the procedure well.  Primary osteoarthritis of both hands-joint protection muscle strengthening was discussed.  Primary osteoarthritis of both knees-she is currently not having much discomfort in her knees.  Myofascial muscle pain-she continues to have some generalized pain.  Stress has exacerbated her symptoms.  Need for regular exercise and water aerobics were discussed.  Family history of rheumatoid arthritis  Family history of lupus erythematosus  History of anemia  History of hyperlipidemia  History of hypertension  History of anxiety   Orders: Orders Placed This Encounter  Procedures  . XR Shoulder Left  . XR Cervical Spine 2 or 3 views   No orders of the defined types were placed in this encounter.   Face-to-face time spent with patient was 30 minutes. Greater than 50% of time was spent in counseling and coordination of care.  Follow-Up Instructions: Return in about 5 months (around 06/13/2018) for Autoimmune  disease.   Shaili Deveshwar, MD  Note - This record has been created using Dragon software.  Chart creation errors have been sought, but may not always  have been located. Such creation errors do not reflect on  the standard of medical care. 

## 2018-01-05 NOTE — Telephone Encounter (Addendum)
Last Visit: 07/03/17 Next Visit: 01/23/18 Labs: 04/11/17 WNL PLQ Eye Exam: 12/06/16 WNL  Left message to advise patient she is due for labs and to update PLQ eye exam.   Okay to refill 30 day supply per Dr. Estanislado Pandy

## 2018-01-08 LAB — URINALYSIS, ROUTINE W REFLEX MICROSCOPIC
Bilirubin Urine: NEGATIVE
Glucose, UA: NEGATIVE
Hgb urine dipstick: NEGATIVE
Ketones, ur: NEGATIVE
Leukocytes, UA: NEGATIVE
Nitrite: NEGATIVE
Protein, ur: NEGATIVE
Specific Gravity, Urine: 1.02 (ref 1.001–1.03)
pH: 7.5 (ref 5.0–8.0)

## 2018-01-08 LAB — CBC WITH DIFFERENTIAL/PLATELET
Basophils Absolute: 40 cells/uL (ref 0–200)
Basophils Relative: 0.9 %
Eosinophils Absolute: 88 cells/uL (ref 15–500)
Eosinophils Relative: 2 %
HCT: 39.2 % (ref 35.0–45.0)
Hemoglobin: 12.8 g/dL (ref 11.7–15.5)
Lymphs Abs: 1870 cells/uL (ref 850–3900)
MCH: 29.6 pg (ref 27.0–33.0)
MCHC: 32.7 g/dL (ref 32.0–36.0)
MCV: 90.7 fL (ref 80.0–100.0)
MPV: 12.4 fL (ref 7.5–12.5)
Monocytes Relative: 7.3 %
Neutro Abs: 2081 cells/uL (ref 1500–7800)
Neutrophils Relative %: 47.3 %
Platelets: 171 10*3/uL (ref 140–400)
RBC: 4.32 10*6/uL (ref 3.80–5.10)
RDW: 13 % (ref 11.0–15.0)
Total Lymphocyte: 42.5 %
WBC mixed population: 321 cells/uL (ref 200–950)
WBC: 4.4 10*3/uL (ref 3.8–10.8)

## 2018-01-08 LAB — ANTI-DNA ANTIBODY, DOUBLE-STRANDED: ds DNA Ab: 7 IU/mL — ABNORMAL HIGH

## 2018-01-08 LAB — COMPLETE METABOLIC PANEL WITH GFR
AG Ratio: 1.8 (calc) (ref 1.0–2.5)
ALT: 26 U/L (ref 6–29)
AST: 24 U/L (ref 10–35)
Albumin: 4.7 g/dL (ref 3.6–5.1)
Alkaline phosphatase (APISO): 63 U/L (ref 33–130)
BUN: 10 mg/dL (ref 7–25)
CO2: 27 mmol/L (ref 20–32)
Calcium: 10.2 mg/dL (ref 8.6–10.4)
Chloride: 102 mmol/L (ref 98–110)
Creat: 0.91 mg/dL (ref 0.50–1.05)
GFR, Est African American: 82 mL/min/{1.73_m2} (ref >=60)
GFR, Est Non African American: 71 mL/min/{1.73_m2} (ref >=60)
Globulin: 2.6 g/dL (calc) (ref 1.9–3.7)
Glucose, Bld: 87 mg/dL (ref 65–99)
Potassium: 4 mmol/L (ref 3.5–5.3)
Sodium: 140 mmol/L (ref 135–146)
Total Bilirubin: 0.5 mg/dL (ref 0.2–1.2)
Total Protein: 7.3 g/dL (ref 6.1–8.1)

## 2018-01-08 LAB — SEDIMENTATION RATE: Sed Rate: 9 mm/h (ref 0–30)

## 2018-01-08 LAB — C3 AND C4
C3 Complement: 123 mg/dL (ref 83–193)
C4 Complement: 25 mg/dL (ref 15–57)

## 2018-01-08 NOTE — Progress Notes (Signed)
dsDNA is stable.

## 2018-01-08 NOTE — Progress Notes (Signed)
All labs are WNL

## 2018-01-11 ENCOUNTER — Ambulatory Visit (INDEPENDENT_AMBULATORY_CARE_PROVIDER_SITE_OTHER): Payer: Self-pay

## 2018-01-11 ENCOUNTER — Ambulatory Visit: Payer: BC Managed Care – PPO | Admitting: Rheumatology

## 2018-01-11 ENCOUNTER — Encounter: Payer: Self-pay | Admitting: Rheumatology

## 2018-01-11 VITALS — BP 108/74 | HR 58 | Resp 12 | Ht 64.0 in | Wt 120.0 lb

## 2018-01-11 DIAGNOSIS — Z862 Personal history of diseases of the blood and blood-forming organs and certain disorders involving the immune mechanism: Secondary | ICD-10-CM

## 2018-01-11 DIAGNOSIS — Z840 Family history of diseases of the skin and subcutaneous tissue: Secondary | ICD-10-CM | POA: Diagnosis not present

## 2018-01-11 DIAGNOSIS — M542 Cervicalgia: Secondary | ICD-10-CM

## 2018-01-11 DIAGNOSIS — Z8679 Personal history of other diseases of the circulatory system: Secondary | ICD-10-CM | POA: Diagnosis not present

## 2018-01-11 DIAGNOSIS — D8989 Other specified disorders involving the immune mechanism, not elsewhere classified: Secondary | ICD-10-CM

## 2018-01-11 DIAGNOSIS — M25512 Pain in left shoulder: Secondary | ICD-10-CM

## 2018-01-11 DIAGNOSIS — M19041 Primary osteoarthritis, right hand: Secondary | ICD-10-CM | POA: Diagnosis not present

## 2018-01-11 DIAGNOSIS — Z8639 Personal history of other endocrine, nutritional and metabolic disease: Secondary | ICD-10-CM

## 2018-01-11 DIAGNOSIS — Z79899 Other long term (current) drug therapy: Secondary | ICD-10-CM

## 2018-01-11 DIAGNOSIS — M170 Bilateral primary osteoarthritis of knee: Secondary | ICD-10-CM | POA: Diagnosis not present

## 2018-01-11 DIAGNOSIS — Z8261 Family history of arthritis: Secondary | ICD-10-CM

## 2018-01-11 DIAGNOSIS — M7918 Myalgia, other site: Secondary | ICD-10-CM

## 2018-01-11 DIAGNOSIS — M359 Systemic involvement of connective tissue, unspecified: Secondary | ICD-10-CM

## 2018-01-11 DIAGNOSIS — M19042 Primary osteoarthritis, left hand: Secondary | ICD-10-CM

## 2018-01-11 DIAGNOSIS — Z8659 Personal history of other mental and behavioral disorders: Secondary | ICD-10-CM

## 2018-01-11 MED ORDER — TRIAMCINOLONE ACETONIDE 40 MG/ML IJ SUSP
10.0000 mg | INTRAMUSCULAR | Status: AC | PRN
  Administered 2018-01-11: 10 mg via INTRAMUSCULAR

## 2018-01-11 MED ORDER — LIDOCAINE HCL 1 % IJ SOLN
0.5000 mL | INTRAMUSCULAR | Status: AC | PRN
  Administered 2018-01-11: .5 mL

## 2018-01-11 MED ORDER — TRIAMCINOLONE ACETONIDE 40 MG/ML IJ SUSP
40.0000 mg | INTRAMUSCULAR | Status: AC | PRN
  Administered 2018-01-11: 40 mg via INTRA_ARTICULAR

## 2018-01-11 MED ORDER — LIDOCAINE HCL 1 % IJ SOLN
1.0000 mL | INTRAMUSCULAR | Status: AC | PRN
  Administered 2018-01-11: 1 mL

## 2018-01-11 NOTE — Patient Instructions (Signed)
Standing Labs We placed an order today for your standing lab work.    Please come back and get your standing labs in   We have open lab Monday through Friday from 8:30-11:30 AM and 1:30-4:00 PM  at the office of Dr. [name].   You may experience shorter wait times on Monday and Friday afternoons. The office is located at [address] [place], [place], [place] [address] No appointment is necessary.   Labs are drawn by [facility].  You may receive a bill from [facility] for your lab work. If you have any questions regarding directions or hours of operation,  please call [phone].

## 2018-01-23 ENCOUNTER — Ambulatory Visit: Payer: BC Managed Care – PPO | Admitting: Rheumatology

## 2018-02-15 ENCOUNTER — Ambulatory Visit: Payer: BC Managed Care – PPO | Admitting: Licensed Clinical Social Worker

## 2018-02-17 ENCOUNTER — Other Ambulatory Visit: Payer: Self-pay | Admitting: Rheumatology

## 2018-02-19 NOTE — Telephone Encounter (Addendum)
Patient states she has enough medication until after her PLQ eye exam which is due and scheduled for 02/21/18.

## 2018-03-05 ENCOUNTER — Other Ambulatory Visit: Payer: Self-pay | Admitting: Medical

## 2018-03-09 ENCOUNTER — Telehealth: Payer: Self-pay | Admitting: Rheumatology

## 2018-03-09 MED ORDER — HYDROXYCHLOROQUINE SULFATE 200 MG PO TABS: 200.0000 mg | ORAL_TABLET | Freq: Two times a day (BID) | ORAL | 0 refills | Status: DC

## 2018-03-09 NOTE — Telephone Encounter (Signed)
Last Visit: 01/11/18 Next Visit: 06/15/18 Labs: 01/05/18 WNL PLQ Eye Exam: 03/09/18  Okay to refill per Dr. Estanislado Pandy

## 2018-03-09 NOTE — Telephone Encounter (Signed)
Patient needs a refill on Plaquenil sent to [facility] [address]. Patient has been out for 2 weeks now. Patient brought over Eye exam form.

## 2018-03-29 ENCOUNTER — Other Ambulatory Visit: Payer: Self-pay | Admitting: Rheumatology

## 2018-03-29 NOTE — Telephone Encounter (Signed)
Last Visit: 01/11/18 Next Visit: 06/15/18 Labs: 01/05/18 WNL PLQ Eye Exam: 03/09/18 WNL  Okay to refill per Dr. Deveshwar 

## 2018-04-19 ENCOUNTER — Ambulatory Visit: Payer: BC Managed Care – PPO | Admitting: Obstetrics and Gynecology

## 2018-04-24 ENCOUNTER — Other Ambulatory Visit: Payer: Self-pay | Admitting: Medical

## 2018-04-24 ENCOUNTER — Telehealth: Payer: Self-pay | Admitting: Medical

## 2018-04-24 NOTE — Telephone Encounter (Signed)
Is this ok to refill?  

## 2018-04-24 NOTE — Telephone Encounter (Signed)
Get in for physical visit.  I received a refill request on the cholesterol medication.  Her last cholesterol panel was well over a year ago.

## 2018-04-25 ENCOUNTER — Other Ambulatory Visit: Payer: Self-pay | Admitting: Medical

## 2018-04-25 MED ORDER — ATORVASTATIN CALCIUM 20 MG PO TABS: 20.0000 mg | ORAL_TABLET | Freq: Every day | ORAL | 0 refills | Status: DC

## 2018-04-25 NOTE — Telephone Encounter (Signed)
Pt is coming in Nov the 25th if you could please refill her lipitor for 30 days until she gets in for her appt

## 2018-04-26 ENCOUNTER — Ambulatory Visit: Payer: BC Managed Care – PPO | Admitting: Obstetrics and Gynecology

## 2018-04-30 ENCOUNTER — Other Ambulatory Visit: Payer: Self-pay | Admitting: Obstetrics and Gynecology

## 2018-04-30 DIAGNOSIS — Z1231 Encounter for screening mammogram for malignant neoplasm of breast: Secondary | ICD-10-CM

## 2018-05-10 ENCOUNTER — Encounter: Payer: Self-pay | Admitting: Medical

## 2018-05-10 ENCOUNTER — Ambulatory Visit: Payer: BC Managed Care – PPO | Admitting: Medical

## 2018-05-10 VITALS — BP 110/70 | HR 63 | Temp 98.1°F | Resp 16 | Ht 64.0 in | Wt 125.6 lb

## 2018-05-10 DIAGNOSIS — H6983 Other specified disorders of Eustachian tube, bilateral: Secondary | ICD-10-CM

## 2018-05-10 MED ORDER — PSEUDOEPHEDRINE HCL ER 120 MG PO TB12: 120.0000 mg | ORAL_TABLET | Freq: Two times a day (BID) | ORAL | 0 refills | Status: DC

## 2018-05-10 MED ORDER — CEFUROXIME AXETIL 500 MG PO TABS: 500.0000 mg | ORAL_TABLET | Freq: Two times a day (BID) | ORAL | 0 refills | Status: DC

## 2018-05-10 NOTE — Progress Notes (Signed)
Subjective:  Shari Lawrence is a 57 y.o. female who presents for  Chief Complaint  Patient presents with  . ears    ears stopped up hearing loss right X 2-3 weeks   Here today for ear fullness ears stopped up for the last 2 to 3 weeks.  She had sinus infection and bronchitis recently just finished antibiotics for this about 2 weeks ago.  That mostly improved but she continues to have the ear fullness worse on the right.  She denies sore throat, fever, nausea or vomiting, dizziness, shortness of breath or rash.  No sick contacts.  Non-smoker.  No other aggravating or relieving factors.  No other complaint.    Past Medical History:  Diagnosis Date  . Abnormal Pap smear 1988   treated with cryo  . Anemia 11/2010   hematology consult prior; etiology malabsorption and uterine bleeding  . Anxiety   . Gastric polyp   . GERD (gastroesophageal reflux disease)   . H/O bone density study 12/2007  . H/O hysterectomy for benign disease 5/14  . History of echocardiogram 03/2010   normal LV function, EF 60-65%, mild left atrial enlargement  . HTN (hypertension) 03/2010   hospitalization for HTN urgency  . Hyperlipemia   . Internal hemorrhoids   . Lumbar degenerative disc disease   . Migraine   . Myalgia   . Spondylosis, cervical   . Umbilical hernia   . Uterine fibroid    hx/o     Current Outpatient Medications on File Prior to Visit  Medication Sig Dispense Refill  . acetaminophen (TYLENOL) 500 MG tablet Take 1 tablet (500 mg total) by mouth every 6 (six) hours as needed. 30 tablet 0  . amLODipine (NORVASC) 5 MG tablet TAKE 1 TABLET BY MOUTH EVERY DAY 90 tablet 0  . Ascorbic Acid (VITAMIN C) 1000 MG tablet Take 1,000 mg by mouth daily.     Marland Kitchen atorvastatin (LIPITOR) 20 MG tablet Take 1 tablet (20 mg total) by mouth daily. 30 tablet 0  . B Complex Vitamins (B COMPLEX PO) Take by mouth.    . calcium carbonate (OS-CAL) 600 MG TABS Take 600 mg by mouth daily.     . [facility] ASPIRIN LOW DOSE 81 MG EC  tablet TAKE 1 TABLET (81 MG TOTAL) BY MOUTH DAILY. 90 tablet 2  . diclofenac sodium (VOLTAREN) 1 % GEL Apply 4 g topically 4 (four) times daily. 100 g 0  . estradiol (VIVELLE-DOT) 0.05 MG/24HR patch PLACE 1 PATCH (0.05 MG TOTAL) ONTO THE SKIN 2 (TWO) TIMES A WEEK. 24 patch 2  . fish oil-omega-3 fatty acids 1000 MG capsule Take 2 g by mouth daily.    . Ginger, Zingiber officinalis, (GINGER ROOT PO) Take by mouth.    . hydroxychloroquine (PLAQUENIL) 200 MG tablet Take 1 tablet by mouth twice daily Monday through Friday 120 tablet 0  . Multiple Vitamin (MULTIVITAMIN) tablet Take 1 tablet by mouth daily.     Marland Kitchen NEXIUM 40 MG capsule Take 1 capsule by mouth daily. Reported on 12/02/2015    . Probiotic Product (PROBIOTIC ADVANCED PO) Take by mouth.    . TURMERIC PO Take by mouth.    . linaclotide (LINZESS) 290 MCG CAPS capsule Take 1 capsule (290 mcg total) by mouth daily before breakfast. (Patient not taking: Reported on 01/11/2018) 30 capsule 0  . Misc Natural Products (TART CHERRY ADVANCED PO) Take by mouth.     No current facility-administered medications on file prior to visit.  ROS as in subjective   Objective: BP 110/70   Pulse 63   Temp 98.1 F (36.7 C) (Oral)   Resp 16   Ht 5\' 4"  (1.626 m)   Wt 125 lb 9.6 oz (57 kg)   LMP 10/25/2012 (Approximate)   SpO2 100%   BMI 21.56 kg/m   General appearance: Alert, well developed, well nourished, no distress                             Skin: warm, no rash                           Head: no sinus tenderness,                            Eyes: conjunctiva pink, corneas clear                            Ears: flat left tympanic membrane, flat right tympanic membrane, external ear canals normal                          Nose: septum midline, turbinates swollen, with erythema and clear discharge             Mouth/throat: MMM, tongue normal, no pharyngeal erythema                           Neck: supple, no adenopathy, no thyromegaly, non tender                          Lungs: clear, no wheezes, no rales, no rhonchi        Assessment  Encounter Diagnosis  Name Primary?  . Eustachian tube dysfunction, bilateral Yes      Plan: We discussed the symptoms and findings.  We discussed conservative care with hydration, rest.  She has several drug allergies including several antibiotic allergies.  We discussed the risk and benefits of Sudafed as well as the risk and benefits of Ceftin antibiotic.  We will move forward cautiously to see how she responds to these medications.  Advised that she has any sign of allergic reaction to the antibiotic such as rash, swelling, angioedema, itching to stop immediately take Benadryl and call us immediately  Otherwise symptoms should resolve within the next 5 to 7 days.  We also discussed the potential for blood pressure elevation on Sudafed.  She is really well controlled and compliant with medication but she will monitor her blood pressures are the next few days  Follow-up as planned soon for physical  Malaika was seen today for ears.  Diagnoses and all orders for this visit:  Eustachian tube dysfunction, bilateral  Other orders -     pseudoephedrine (SUDAFED 12 HOUR) 120 MG 12 hr tablet; Take 1 tablet (120 mg total) by mouth 2 (two) times daily. -     cefUROXime (CEFTIN) 500 MG tablet; Take 1 tablet (500 mg total) by mouth 2 (two) times daily with a meal.

## 2018-05-10 NOTE — Patient Instructions (Signed)
Recommendations:  Begin Flonase allergy spray OTC 1 spray in each nostril twice daily for the next 5-7 days  Begin Sudafed up to twice daily for 5 days for congestion in head and ears  Hydrate well with water  You may begin Ceftin antibiotic twice daily for 7-10 days  If you noticed rash, itchy, shortness of breath on the Ceftin, stop this immediately, take benadryl 25mg  over the counter and call us

## 2018-05-19 ENCOUNTER — Other Ambulatory Visit: Payer: Self-pay | Admitting: Medical

## 2018-05-21 ENCOUNTER — Ambulatory Visit (INDEPENDENT_AMBULATORY_CARE_PROVIDER_SITE_OTHER): Payer: BC Managed Care – PPO | Admitting: Medical

## 2018-05-21 ENCOUNTER — Encounter: Payer: Self-pay | Admitting: Medical

## 2018-05-21 VITALS — BP 118/70 | HR 56 | Temp 98.0°F | Resp 16 | Ht 64.0 in | Wt 124.4 lb

## 2018-05-21 DIAGNOSIS — E785 Hyperlipidemia, unspecified: Secondary | ICD-10-CM

## 2018-05-21 DIAGNOSIS — I10 Essential (primary) hypertension: Secondary | ICD-10-CM

## 2018-05-21 DIAGNOSIS — Z79899 Other long term (current) drug therapy: Secondary | ICD-10-CM

## 2018-05-21 DIAGNOSIS — M2550 Pain in unspecified joint: Secondary | ICD-10-CM

## 2018-05-21 DIAGNOSIS — Z7185 Encounter for immunization safety counseling: Secondary | ICD-10-CM | POA: Insufficient documentation

## 2018-05-21 DIAGNOSIS — Z7189 Other specified counseling: Secondary | ICD-10-CM

## 2018-05-21 DIAGNOSIS — Z23 Encounter for immunization: Secondary | ICD-10-CM

## 2018-05-21 DIAGNOSIS — M19042 Primary osteoarthritis, left hand: Secondary | ICD-10-CM

## 2018-05-21 DIAGNOSIS — K9041 Non-celiac gluten sensitivity: Secondary | ICD-10-CM

## 2018-05-21 DIAGNOSIS — Z840 Family history of diseases of the skin and subcutaneous tissue: Secondary | ICD-10-CM

## 2018-05-21 DIAGNOSIS — K5909 Other constipation: Secondary | ICD-10-CM

## 2018-05-21 DIAGNOSIS — Z8261 Family history of arthritis: Secondary | ICD-10-CM

## 2018-05-21 DIAGNOSIS — Z0000 Encounter for general adult medical examination without abnormal findings: Secondary | ICD-10-CM | POA: Diagnosis not present

## 2018-05-21 DIAGNOSIS — R7301 Impaired fasting glucose: Secondary | ICD-10-CM

## 2018-05-21 DIAGNOSIS — M359 Systemic involvement of connective tissue, unspecified: Secondary | ICD-10-CM

## 2018-05-21 DIAGNOSIS — M19041 Primary osteoarthritis, right hand: Secondary | ICD-10-CM

## 2018-05-21 LAB — POCT URINALYSIS DIP (PROADVANTAGE DEVICE)
Bilirubin, UA: NEGATIVE
Glucose, UA: NEGATIVE mg/dL
Leukocytes, UA: NEGATIVE
Nitrite, UA: NEGATIVE
Protein Ur, POC: NEGATIVE mg/dL
Specific Gravity, Urine: 1.02
Urobilinogen, Ur: NEGATIVE
pH, UA: 6.5 (ref 5.0–8.0)

## 2018-05-21 NOTE — Progress Notes (Signed)
Subjective:   HPI  Shari Lawrence is a 57 y.o. female who presents for Chief Complaint  Patient presents with  . CPE    fasting CPE   eye exam 04-2018  has GYN    Medical care team includes: Sahian Kerney, [name], PA-C here for primary care Eye doctor, Dr. Gershon Crane Dentist Gynecology -  [facility] Dr. Estanislado Pandy - rheumatology Dr. Collene Mares, GI   Concerns: Exercising some, getting ready to start back using her personal trainer 2 to 3 days/week.  Having some constipation issues of late.  Otherwise been in usual state of health  Still has some ear discomfort from last visit recently here.  Did not start antibiotic yet.  She is using the Sudafed and Flonase.  Past Medical History:  Diagnosis Date  . Abnormal Pap smear 1988   treated with cryo  . Anemia 11/2010   hematology consult prior; etiology malabsorption and uterine bleeding  . Anxiety   . Gastric polyp   . GERD (gastroesophageal reflux disease)   . H/O bone density study 12/2007  . H/O hysterectomy for benign disease 5/14  . History of echocardiogram 03/2010   normal LV function, EF 60-65%, mild left atrial enlargement  . HTN (hypertension) 03/2010   hospitalization for HTN urgency  . Hyperlipemia   . Internal hemorrhoids   . Lumbar degenerative disc disease   . Migraine   . Myalgia   . Spondylosis, cervical   . Umbilical hernia   . Uterine fibroid    hx/o     Past Surgical History:  Procedure Laterality Date  . CERVICAL FUSION  2010  . CERVIX LESION DESTRUCTION  1988  . CESAREAN SECTION    . CHOLECYSTECTOMY  2003  . COLONOSCOPY  08/2010   Dr. Collene Mares  . COLONOSCOPY  02/2017   Dr. Collene Mares  . ESOPHAGOGASTRODUODENOSCOPY  08/2010   Dr. Collene Mares  . EXPLORATORY LAPAROTOMY    . HERNIA REPAIR    . MYOMECTOMY  1998  . PELVIC LAPAROSCOPY    . ROBOTIC ASSISTED LAPAROSCOPIC HYSTERECTOMY AND SALPINGECTOMY  10/2012   [facility]; laproscopic due to fibroids  . UMBILICAL HERNIA REPAIR  10/2013   infected laparoscopic port,  mesh placement    Social History   Socioeconomic History  . Marital status: Divorced    Spouse name: Not on file  . Number of children: 1  . Years of education: Not on file  . Highest education level: Not on file  Occupational History    Employer: [employer]    Comment: molecular Production designer, theatre/television/film, PhD candidate, [employer]  Social Needs  . Financial resource strain: Not on file  . Food insecurity:    Worry: Not on file    Inability: Not on file  . Transportation needs:    Medical: Not on file    Non-medical: Not on file  Tobacco Use  . Smoking status: Never Smoker  . Smokeless tobacco: Never Used  Substance and Sexual Activity  . Alcohol use: No  . Drug use: No  . Sexual activity: Not Currently    Partners: Male    Birth control/protection: Surgical  Lifestyle  . Physical activity:    Days per week: Not on file    Minutes per session: Not on file  . Stress: Not on file  Relationships  . Social connections:    Talks on phone: Not on file    Gets together: Not on file    Attends religious service: Not on file  Active member of club or organization: Not on file    Attends meetings of clubs or organizations: Not on file    Relationship status: Not on file  . Intimate partner violence:    Fear of current or ex partner: Not on file    Emotionally abused: Not on file    Physically abused: Not on file    Forced sexual activity: Not on file  Other Topics Concern  . 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  Has 58 yo daughter.  Her siblings and her don't agree on helping take care of their parents, causes family tension.  As of 11/2014    Family History  Problem Relation Age of Onset  . Hypertension Mother   . Dementia Mother    . Colon cancer Mother 29  . Deep vein thrombosis Father   . Hypertension Father   . Cancer Father        prostate CA  . Heart disease Father 11       CAD  . Hypertension Sister   . Hyperlipidemia Sister   . Hypertension Brother   . Hyperlipidemia Brother   . Hypertension Maternal Aunt   . Hyperlipidemia Maternal Aunt   . Rheum arthritis Maternal Aunt   . Diabetes Maternal Aunt        1 with type II, 1 with type 1  . Hypertension Brother   . Rheum arthritis Brother   . Diabetes Paternal Aunt        type II     Current Outpatient Medications:  .  acetaminophen (TYLENOL) 500 MG tablet, Take 1 tablet (500 mg total) by mouth every 6 (six) hours as needed., Disp: 30 tablet, Rfl: 0 .  amLODipine (NORVASC) 5 MG tablet, TAKE 1 TABLET BY MOUTH EVERY DAY, Disp: 90 tablet, Rfl: 0 .  Ascorbic Acid (VITAMIN C) 1000 MG tablet, Take 1,000 mg by mouth daily. , Disp: , Rfl:  .  atorvastatin (LIPITOR) 20 MG tablet, Take 1 tablet (20 mg total) by mouth daily., Disp: 30 tablet, Rfl: 0 .  B Complex Vitamins (B COMPLEX PO), Take by mouth., Disp: , Rfl:  .  calcium carbonate (OS-CAL) 600 MG TABS, Take 600 mg by mouth daily. , Disp: , Rfl:  .  [facility] ASPIRIN LOW DOSE 81 MG EC tablet, TAKE 1 TABLET (81 MG TOTAL) BY MOUTH DAILY., Disp: 90 tablet, Rfl: 2 .  estradiol (VIVELLE-DOT) 0.05 MG/24HR patch, PLACE 1 PATCH (0.05 MG TOTAL) ONTO THE SKIN 2 (TWO) TIMES A WEEK., Disp: 24 patch, Rfl: 2 .  fish oil-omega-3 fatty acids 1000 MG capsule, Take 2 g by mouth daily., Disp: , Rfl:  .  Ginger, Zingiber officinalis, (GINGER ROOT PO), Take by mouth., Disp: , Rfl:  .  hydroxychloroquine (PLAQUENIL) 200 MG tablet, Take 1 tablet by mouth twice daily Monday through Friday, Disp: 120 tablet, Rfl: 0 .  Multiple Vitamin (MULTIVITAMIN) tablet, Take 1 tablet by mouth daily. , Disp: , Rfl:  .  NEXIUM 40 MG capsule, Take 1 capsule by mouth daily. Reported on 12/02/2015, Disp: , Rfl:  .  Probiotic Product (PROBIOTIC ADVANCED PO),  Take by mouth., Disp: , Rfl:  .  TURMERIC PO, Take by mouth., Disp: , Rfl:  .  cefUROXime (CEFTIN) 500 MG  tablet, Take 1 tablet (500 mg total) by mouth 2 (two) times daily with a meal. (Patient not taking: Reported on 05/21/2018), Disp: 20 tablet, Rfl: 0 .  diclofenac sodium (VOLTAREN) 1 % GEL, Apply 4 g topically 4 (four) times daily. (Patient not taking: Reported on 05/21/2018), Disp: 100 g, Rfl: 0 .  linaclotide (LINZESS) 290 MCG CAPS capsule, Take 1 capsule (290 mcg total) by mouth daily before breakfast. (Patient not taking: Reported on 01/11/2018), Disp: 30 capsule, Rfl: 0 .  Misc Natural Products (TART CHERRY ADVANCED PO), Take by mouth., Disp: , Rfl:  .  pseudoephedrine (SUDAFED 12 HOUR) 120 MG 12 hr tablet, Take 1 tablet (120 mg total) by mouth 2 (two) times daily. (Patient not taking: Reported on 05/21/2018), Disp: 10 tablet, Rfl: 0  Allergies  Allergen Reactions  . Quinolones Anaphylaxis and Other (See Comments)    [place] developed all over her body  . Latex Itching and Other (See Comments)    Skin yellowing  . Levaquin [Levofloxacin] Hives    Throat swelling  . Penicillins   . Percocet [Oxycodone-Acetaminophen]   . Sulfa Drugs Cross Reactors Itching, Swelling and Other (See Comments)    Red face  . Vicodin [Hydrocodone-Acetaminophen] Itching and Nausea And Vomiting  . Gluten Meal     Other reaction(s): Other (See Comments) IBS     Reviewed their medical, surgical, family, social, medication, and allergy history and updated chart as appropriate.   Review of Systems Constitutional: -fever, -chills, -sweats, -unexpected weight change, -decreased appetite, -fatigue Allergy: -sneezing, -itching, -congestion Dermatology: -changing moles, --rash, -lumps ENT: -runny nose, +ear pain, -sore throat, -hoarseness, -sinus pain, -teeth pain, - ringing in ears, -hearing loss, -nosebleeds Cardiology: -chest pain, -palpitations, -swelling, -difficulty breathing when lying flat, -waking up  short of breath Respiratory: -cough, -shortness of breath, -difficulty breathing with exercise or exertion, -wheezing, -coughing up blood Gastroenterology: -abdominal pain, -nausea, -vomiting, -diarrhea, +constipation, -blood in stool, -changes in bowel movement, -difficulty swallowing or eating Hematology: -bleeding, -bruising  Musculoskeletal: +joint aches, -muscle aches, -joint swelling, -back pain, -neck pain, -cramping, -changes in gait Ophthalmology: denies vision changes, eye redness, itching, discharge Urology: -burning with urination, -difficulty urinating, -blood in urine, -urinary frequency, -urgency, -incontinence Neurology: -headache, -weakness, -tingling, -numbness, -memory loss, -falls, -dizziness Psychology: -depressed mood, -agitation, -sleep problems Breast/gyn: -breast tenderness, -discharge, -lumps, -vaginal discharge,- irregular periods, -heavy periods     Objective:  BP 118/70   Pulse (!) 56   Temp 98 F (36.7 C) (Oral)   Resp 16   Ht 5\' 4"  (1.626 m)   Wt 124 lb 6.4 oz (56.4 kg)   LMP 10/25/2012 (Approximate)   SpO2 99%   BMI 21.35 kg/m   General appearance: alert, no distress, WD/WN, African American female Skin: no worrisome findings HEENT: normocephalic, conjunctiva/corneas normal, sclerae anicteric, PERRLA, EOMi, nares patent, no discharge or erythema, pharynx normal Oral cavity: MMM, tongue normal, teeth in good repair Neck: supple, no lymphadenopathy, no thyromegaly, no masses, normal ROM, no bruits Chest: non tender, normal shape and expansion Heart: RRR, normal S1, S2, no murmurs Lungs: CTA bilaterally, no wheezes, rhonchi, or rales Abdomen: +bs, soft, umbilical and other surgical scars noted, non tender, non distended, no masses, no hepatomegaly, no splenomegaly, no bruits Back: non tender, normal ROM, no scoliosis Musculoskeletal: upper extremities non tender, no obvious deformity, normal ROM throughout, lower extremities non tender, no obvious  deformity, normal ROM throughout Extremities: no edema, no cyanosis, no clubbing Pulses: 2+ symmetric, upper and lower extremities, normal cap  refill Neurological: alert, oriented x 3, CN2-12 intact, strength normal upper extremities and lower extremities, sensation normal throughout, DTRs 2+ throughout, no cerebellar signs, gait normal Psychiatric: normal affect, behavior normal, pleasant  Breast/gyn/rectal - deferred to gynecology    Assessment and Plan :   Encounter Diagnoses  Name Primary?  . Routine general medical examination at a health care facility Yes  . Need for influenza vaccination   . Essential hypertension   . Impaired fasting glucose   . Autoimmune disease ([place])   . Primary osteoarthritis of both hands   . Family history of lupus erythematosus   . Family history of rheumatoid arthritis   . Gluten intolerance   . High risk medication use   . Hyperlipidemia, unspecified hyperlipidemia type   . Polyarthralgia   . Vaccine counseling   . Chronic constipation     Physical exam - discussed and counseled on healthy lifestyle, diet, exercise, preventative care, vaccinations, sick and well care, proper use of emergency dept and after hours care, and addressed their concerns.    Health screening: Advised they see their eye doctor yearly for routine vision care. Advised they see their dentist yearly for routine dental care including hygiene visits twice yearly. See your gynecologist yearly for routine gynecological care.  Bone density - recommended bone density evaluation   Cancer screening Counseled on self breast exams, mammograms, cervical cancer screening  Colonoscopy:  Reviewed colonoscopy on file that is up to date, Dr. Collene Mares  Vaccinations: Advised yearly influenza vaccine Counseled on the influenza virus vaccine.  Vaccine information sheet given.  Influenza vaccine given after consent obtained.  Counseled on Shingles vaccine at age 60 years and older Patient  will check insurance coverage for this and consider vaccination   Acute issues discussed: Ear pain - pending labs, will likely recommend she begin antibiotic recently sent to pharmacy last visit  Separate significant chronic issues discussed: constipation - counseled on diet, water intake and medications she has used in the past to help the symptoms  C/t routine f/u with rheumatology and gastroenterology  Elexia was seen today for cpe.  Diagnoses and all orders for this visit:  Routine general medical examination at a health care facility -     POCT Urinalysis DIP (Proadvantage Device) -     Comprehensive metabolic panel -     CBC with Differential/Platelet -     Lipid panel -     Hemoglobin A1c -     VITAMIN D 25 Hydroxy (Vit-D Deficiency, Fractures)  Need for influenza vaccination -     Flu Vaccine QUAD 6+ mos PF IM (Fluarix Quad PF)  Essential hypertension  Impaired fasting glucose -     Hemoglobin A1c  Autoimmune disease (HCC)  Primary osteoarthritis of both hands  Family history of lupus erythematosus  Family history of rheumatoid arthritis  Gluten intolerance -     VITAMIN D 25 Hydroxy (Vit-D Deficiency, Fractures)  High risk medication use -     Comprehensive metabolic panel -     CBC with Differential/Platelet  Hyperlipidemia, unspecified hyperlipidemia type -     Lipid panel  Polyarthralgia  Vaccine counseling  Chronic constipation    Follow-up pending labs, yearly for physical

## 2018-05-21 NOTE — Telephone Encounter (Signed)
Is this ok to refill?  

## 2018-05-22 LAB — LIPID PANEL
Chol/HDL Ratio: 2.2 ratio (ref 0.0–4.4)
Cholesterol, Total: 169 mg/dL (ref 100–199)
HDL: 77 mg/dL (ref >39)
LDL Calculated: 82 mg/dL (ref 0–99)
Triglycerides: 52 mg/dL (ref 0–149)
VLDL Cholesterol Cal: 10 mg/dL (ref 5–40)

## 2018-05-22 LAB — CBC WITH DIFFERENTIAL/PLATELET
Basophils Absolute: 0 10*3/uL (ref 0.0–0.2)
Basos: 1 %
EOS (ABSOLUTE): 0.2 10*3/uL (ref 0.0–0.4)
Eos: 4 %
Hematocrit: 34.8 % (ref 34.0–46.6)
Hemoglobin: 11.5 g/dL (ref 11.1–15.9)
Immature Grans (Abs): 0 10*3/uL (ref 0.0–0.1)
Immature Granulocytes: 0 %
Lymphocytes Absolute: 1.8 10*3/uL (ref 0.7–3.1)
Lymphs: 42 %
MCH: 29.9 pg (ref 26.6–33.0)
MCHC: 33 g/dL (ref 31.5–35.7)
MCV: 90 fL (ref 79–97)
Monocytes Absolute: 0.3 10*3/uL (ref 0.1–0.9)
Monocytes: 8 %
Neutrophils Absolute: 1.9 10*3/uL (ref 1.4–7.0)
Neutrophils: 45 %
Platelets: 151 10*3/uL (ref 150–450)
RBC: 3.85 x10E6/uL (ref 3.77–5.28)
RDW: 12.8 % (ref 12.3–15.4)
WBC: 4.2 10*3/uL (ref 3.4–10.8)

## 2018-05-22 LAB — COMPREHENSIVE METABOLIC PANEL
ALT: 22 IU/L (ref 0–32)
AST: 18 IU/L (ref 0–40)
Albumin/Globulin Ratio: 2.1 (ref 1.2–2.2)
Albumin: 4.5 g/dL (ref 3.5–5.5)
Alkaline Phosphatase: 55 IU/L (ref 39–117)
BUN/Creatinine Ratio: 15 (ref 9–23)
BUN: 12 mg/dL (ref 6–24)
Bilirubin Total: 0.4 mg/dL (ref 0.0–1.2)
CO2: 23 mmol/L (ref 20–29)
Calcium: 9.8 mg/dL (ref 8.7–10.2)
Chloride: 103 mmol/L (ref 96–106)
Creatinine, Ser: 0.82 mg/dL (ref 0.57–1.00)
GFR calc Af Amer: 93 mL/min/{1.73_m2} (ref >59)
GFR calc non Af Amer: 80 mL/min/{1.73_m2} (ref >59)
Globulin, Total: 2.1 g/dL (ref 1.5–4.5)
Glucose: 78 mg/dL (ref 65–99)
Potassium: 3.6 mmol/L (ref 3.5–5.2)
Sodium: 141 mmol/L (ref 134–144)
Total Protein: 6.6 g/dL (ref 6.0–8.5)

## 2018-05-22 LAB — HEMOGLOBIN A1C
Est. average glucose Bld gHb Est-mCnc: 126 mg/dL
Hgb A1c MFr Bld: 6 % — ABNORMAL HIGH (ref 4.8–5.6)

## 2018-05-22 LAB — VITAMIN D 25 HYDROXY (VIT D DEFICIENCY, FRACTURES): Vit D, 25-Hydroxy: 86 ng/mL (ref 30.0–100.0)

## 2018-05-23 ENCOUNTER — Other Ambulatory Visit: Payer: Self-pay

## 2018-05-23 ENCOUNTER — Other Ambulatory Visit: Payer: Self-pay | Admitting: Medical

## 2018-05-23 DIAGNOSIS — R319 Hematuria, unspecified: Secondary | ICD-10-CM

## 2018-05-23 MED ORDER — AMLODIPINE BESYLATE 5 MG PO TABS: 5.0000 mg | ORAL_TABLET | Freq: Every day | ORAL | 3 refills | Status: DC

## 2018-05-23 MED ORDER — ATORVASTATIN CALCIUM 20 MG PO TABS: 20.0000 mg | ORAL_TABLET | Freq: Every day | ORAL | 3 refills | Status: DC

## 2018-05-25 ENCOUNTER — Other Ambulatory Visit: Payer: Self-pay | Admitting: Rheumatology

## 2018-05-28 NOTE — Telephone Encounter (Signed)
Last Visit: 01/11/18 Next Visit: 06/15/18 Labs: 01/05/18 WNL PLQ Eye Exam: 03/09/18 WNL  Okay to refill per Dr. Estanislado Pandy

## 2018-06-01 NOTE — Progress Notes (Deleted)
Office Visit Note  Patient: Shari Lawrence             Date of Birth: 1961-06-16           MRN: [id number]             PCP: Carlena Hurl, PA-C Referring: Carlena Hurl, PA-C Visit Date: 06/15/2018 Occupation: @GUAROCC @  Subjective:  No chief complaint on file.   History of Present Illness: Shari Lawrence is a 57 y.o. female ***   Activities of Daily Living:  Patient reports morning stiffness for *** {minute/hour:19697}.   Patient {ACTIONS;DENIES/REPORTS:21021675::"Denies"} nocturnal pain.  Difficulty dressing/grooming: {ACTIONS;DENIES/REPORTS:21021675::"Denies"} Difficulty climbing stairs: {ACTIONS;DENIES/REPORTS:21021675::"Denies"} Difficulty getting out of chair: {ACTIONS;DENIES/REPORTS:21021675::"Denies"} Difficulty using hands for taps, buttons, cutlery, and/or writing: {ACTIONS;DENIES/REPORTS:21021675::"Denies"}  No Rheumatology ROS completed.   PMFS History:  Patient Active Problem List   Diagnosis Date Noted  . Vaccine counseling 05/21/2018  . Need for influenza vaccination 05/21/2018  . Routine general medical examination at a health care facility 05/21/2018  . Caregiver burden 07/05/2017  . Cyst of skin 07/05/2017  . Transient elevated blood pressure 06/28/2017  . Leg pain, diffuse, left 06/28/2017  . Leg swelling 06/28/2017  . Bruising 06/28/2017  . Myofascial muscle pain 06/28/2017  . High risk medication use 03/30/2017  . Chronic constipation 11/17/2016  . Globus sensation 11/17/2016  . Primary osteoarthritis of both knees 10/05/2016  . Autoimmune disease ([place]) 09/29/2016  . Primary osteoarthritis of both hands 09/29/2016  . Family history of lupus erythematosus 09/20/2016  . Polyarthralgia 05/06/2016  . Clenching of teeth 05/06/2016  . Finger swelling 05/06/2016  . Morning stiffness of joints 05/06/2016  . Myalgia 05/06/2016  . Family history of rheumatoid arthritis 05/06/2016  . Essential hypertension 12/23/2014  . Hyperlipidemia 12/23/2014   . History of anemia 12/23/2014  . Care provider for parents 12/23/2014  . Impaired fasting glucose 10/25/2011  . Gluten intolerance 10/25/2011  . Gastroesophageal reflux disease without esophagitis 10/25/2011  . Anxiety state   . Iron deficiency anemia     Past Medical History:  Diagnosis Date  . Abnormal Pap smear 1988   treated with cryo  . Anemia 11/2010   hematology consult prior; etiology malabsorption and uterine bleeding  . Anxiety   . Gastric polyp   . GERD (gastroesophageal reflux disease)   . H/O bone density study 12/2007  . H/O hysterectomy for benign disease 5/14  . History of echocardiogram 03/2010   normal LV function, EF 60-65%, mild left atrial enlargement  . HTN (hypertension) 03/2010   hospitalization for HTN urgency  . Hyperlipemia   . Internal hemorrhoids   . Lumbar degenerative disc disease   . Migraine   . Myalgia   . Spondylosis, cervical   . Umbilical hernia   . Uterine fibroid    hx/o     Family History  Problem Relation Age of Onset  . Hypertension Mother   . Dementia Mother   . Colon cancer Mother 13  . Deep vein thrombosis Father   . Hypertension Father   . Cancer Father        prostate CA  . Heart disease Father 58       CAD  . Hypertension Sister   . Hyperlipidemia Sister   . Hypertension Brother   . Hyperlipidemia Brother   . Hypertension Maternal Aunt   . Hyperlipidemia Maternal Aunt   . Rheum arthritis Maternal Aunt   . Diabetes Maternal Aunt  1 with type II, 1 with type 1  . Hypertension Brother   . Rheum arthritis Brother   . Diabetes Paternal Aunt        type II   Past Surgical History:  Procedure Laterality Date  . CERVICAL FUSION  2010  . CERVIX LESION DESTRUCTION  1988  . CESAREAN SECTION    . CHOLECYSTECTOMY  2003  . COLONOSCOPY  08/2010   Dr. Collene Mares  . COLONOSCOPY  02/2017   Dr. Collene Mares  . ESOPHAGOGASTRODUODENOSCOPY  08/2010   Dr. Collene Mares  . EXPLORATORY LAPAROTOMY    . HERNIA REPAIR    . MYOMECTOMY  1998  .  PELVIC LAPAROSCOPY    . ROBOTIC ASSISTED LAPAROSCOPIC HYSTERECTOMY AND SALPINGECTOMY  10/2012   [facility]; laproscopic due to fibroids  . 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  Has 85 yo daughter.  Her siblings and her don't agree on helping take care of their parents, causes family tension.  As of 11/2014    Objective: Vital Signs: LMP 10/25/2012 (Approximate)    Physical Exam   Musculoskeletal Exam: ***  CDAI Exam: CDAI Score: Not documented Patient Global Assessment: Not documented; Provider Global Assessment: Not documented Swollen: Not documented; Tender: Not documented Joint Exam   Not documented   There is currently no information documented on the homunculus. Go to the Rheumatology activity and complete the homunculus joint exam.  Investigation: No additional findings.  Imaging: No results found.  Recent Labs: Lab Results  Component Value Date   WBC 4.2 05/21/2018   HGB 11.5 05/21/2018   PLT 151 05/21/2018   NA 141 05/21/2018   K 3.6 05/21/2018   CL 103 05/21/2018   CO2 23 05/21/2018   GLUCOSE 78 05/21/2018   BUN 12 05/21/2018   CREATININE 0.82 05/21/2018   BILITOT 0.4 05/21/2018   ALKPHOS 55 05/21/2018   AST 18 05/21/2018   ALT 22 05/21/2018   PROT 6.6 05/21/2018   ALBUMIN 4.5 05/21/2018   CALCIUM 9.8 05/21/2018   GFRAA 93 05/21/2018    Speciality Comments: PLQ Eye Exam: 03/09/18 WNL @ [facility]   Procedures:  No procedures performed Allergies: Quinolones; Latex; Levaquin [levofloxacin]; Penicillins; Percocet [oxycodone-acetaminophen]; Sulfa drugs cross reactors; Vicodin [hydrocodone-acetaminophen]; and Gluten meal   Assessment /  Plan:     Visit Diagnoses: No diagnosis found.   Orders: No orders of the defined types were placed in this encounter.  No orders of the defined types were placed in this encounter.   Face-to-face time spent with patient was *** minutes. Greater than 50% of time was spent in counseling and coordination of care.  Follow-Up Instructions: No follow-ups on file.   Earnestine Mealing, CMA  Note - This record has been created using Editor, commissioning.  Chart creation errors have been sought, but may not always  have been located. Such creation errors do not reflect on  the standard of medical care.

## 2018-06-04 ENCOUNTER — Ambulatory Visit: Payer: BC Managed Care – PPO | Admitting: Obstetrics and Gynecology

## 2018-06-04 ENCOUNTER — Encounter: Payer: Self-pay | Admitting: Obstetrics and Gynecology

## 2018-06-04 ENCOUNTER — Other Ambulatory Visit: Payer: Self-pay

## 2018-06-04 VITALS — BP 112/68 | HR 80 | Ht 64.0 in | Wt 122.2 lb

## 2018-06-04 DIAGNOSIS — M8580 Other specified disorders of bone density and structure, unspecified site: Secondary | ICD-10-CM

## 2018-06-04 DIAGNOSIS — Z79890 Hormone replacement therapy (postmenopausal): Secondary | ICD-10-CM | POA: Diagnosis not present

## 2018-06-04 DIAGNOSIS — Z01419 Encounter for gynecological examination (general) (routine) without abnormal findings: Secondary | ICD-10-CM

## 2018-06-04 MED ORDER — ESTRADIOL 0.0375 MG/24HR TD PTTW: 1.0000 | MEDICATED_PATCH | TRANSDERMAL | 1 refills | Status: DC

## 2018-06-04 NOTE — Patient Instructions (Addendum)
EXERCISE AND DIET:  We recommended that you start or continue a regular exercise program for good health. Regular exercise means any activity that makes your heart beat faster and makes you sweat.  We recommend exercising at least 30 minutes per day at least 3 days a week, preferably 4 or 5.  We also recommend a diet low in fat and sugar.  Inactivity, poor dietary choices and obesity can cause diabetes, heart attack, stroke, and kidney damage, among others.    ALCOHOL AND SMOKING:  Women should limit their alcohol intake to no more than 7 drinks/beers/glasses of wine (combined, not each!) per week. Moderation of alcohol intake to this level decreases your risk of breast cancer and liver damage. And of course, no recreational drugs are part of a healthy lifestyle.  And absolutely no smoking or even second hand smoke. Most people know smoking can cause heart and lung diseases, but did you know it also contributes to weakening of your bones? Aging of your skin?  Yellowing of your teeth and nails?  CALCIUM AND VITAMIN D:  Adequate intake of calcium and Vitamin D are recommended.  The recommendations for exact amounts of these supplements seem to change often, but generally speaking 600 mg of calcium (either carbonate or citrate) and 800 units of Vitamin D per day seems prudent. Certain women may benefit from higher intake of Vitamin D.  If you are among these women, your doctor will have told you during your visit.    PAP SMEARS:  Pap smears, to check for cervical cancer or precancers,  have traditionally been done yearly, although recent scientific advances have shown that most women can have pap smears less often.  However, every woman still should have a physical exam from her gynecologist every year. It will include a breast check, inspection of the vulva and vagina to check for abnormal growths or skin changes, a visual exam of the cervix, and then an exam to evaluate the size and shape of the uterus and  ovaries.  And after 56 years of age, a rectal exam is indicated to check for rectal cancers. We will also provide age appropriate advice regarding health maintenance, like when you should have certain vaccines, screening for sexually transmitted diseases, bone density testing, colonoscopy, mammograms, etc.   MAMMOGRAMS:  All women over 40 years old should have a yearly mammogram. Many facilities now offer a "3D" mammogram, which may cost around $50 extra out of pocket. If possible,  we recommend you accept the option to have the 3D mammogram performed.  It both reduces the number of women who will be called back for extra views which then turn out to be normal, and it is better than the routine mammogram at detecting truly abnormal areas.    COLONOSCOPY:  Colonoscopy to screen for colon cancer is recommended for all women at age 50.  We know, you hate the idea of the prep.  We agree, BUT, having colon cancer and not knowing it is worse!!  Colon cancer so often starts as a polyp that can be seen and removed at colonscopy, which can quite literally save your life!  And if your first colonoscopy is normal and you have no family history of colon cancer, most women don't have to have it again for 10 years.  Once every ten years, you can do something that may end up saving your life, right?  We will be happy to help you get it scheduled when you are ready.    Be sure to check your insurance coverage so you understand how much it will cost.  It may be covered as a preventative service at no cost, but you should check your particular policy.      Menopause and Hormone Replacement Therapy What is hormone replacement therapy? Hormone replacement therapy (HRT) is the use of artificial (synthetic) hormones to replace hormones that your body stops producing during menopause. Menopause is the normal time of life when menstrual periods stop completely and the ovaries stop producing the female hormones estrogen and  progesterone. This lack of hormones can affect your health and cause undesirable symptoms. HRT can relieve some of those symptoms. What are my options for HRT? HRT may consist of the synthetic hormones estrogen and progestin, or it may consist of only estrogen (estrogen-only therapy). You and your health care provider will decide which form of HRT is best for you. If you choose to be on HRT and you have a uterus, estrogen and progestin are usually prescribed. Estrogen-only therapy is used for women who do not have a uterus. Possible options for taking HRT include:  Pills.  Patches.  Gels.  Sprays.  Vaginal cream.  Vaginal rings.  Vaginal inserts.  The amount of hormone(s) that you take and how long you take the hormone(s) varies depending on your individual health. It is important to:  Begin HRT with the lowest possible dosage.  Stop HRT as soon as your health care provider tells you to stop.  Work with your health care provider so that you feel informed and comfortable with your decisions.  What are the benefits of HRT? HRT can reduce the frequency and severity of menopausal symptoms. Benefits of HRT vary depending on the menopausal symptoms that you have, the severity of your symptoms, and your overall health. HRT may help to improve the following menopausal symptoms:  Hot flashes and night sweats. These are sudden feelings of heat that spread over the face and body. The skin may turn red, like a blush. Night sweats are hot flashes that happen while you are sleeping or trying to sleep.  Bone loss (osteoporosis). The body loses calcium more quickly after menopause, causing the bones to become weaker. This can increase the risk for bone breaks (fractures).  Vaginal dryness. The lining of the vagina can become thin and dry, which can cause pain during sexual intercourse or cause infection, burning, or itching.  Urinary tract infections.  Urinary incontinence. This is a  decreased ability to control when you urinate.  Irritability.  Short-term memory problems.  What are the risks of HRT? Risks of HRT vary depending on your individual health and medical history. Risks of HRT also depend on whether you receive both estrogen and progestin or you receive estrogen only.HRT may increase the risk of:  Spotting. This is when a small amount of bloodleaks from the vagina unexpectedly.  Endometrial cancer. This cancer is in the lining of the uterus (endometrium).  Breast cancer.  Increased density of breast tissue. This can make it harder to find breast cancer on a breast X-ray (mammogram).  Stroke.  Heart attack.  Blood clots.  Gallbladder disease.  Risks of HRT can increase if you have any of the following conditions:  Endometrial cancer.  Liver disease.  Heart disease.  Breast cancer.  History of blood clots.  History of stroke.  How should I care for myself while I am on HRT?  Take over-the-counter and prescription medicines only as told by your health care  provider.  Get mammograms, pelvic exams, and medical checkups as often as told by your health care provider.  Have Pap tests done as often as told by your health care provider. A Pap test is sometimes called a Pap smear. It is a screening test that is used to check for signs of cancer of the cervix and vagina. A Pap test can also identify the presence of infection or precancerous changes. Pap tests may be done: ? Every 3 years, starting at age 44. ? Every 5 years, starting after age 50, in combination with testing for human papillomavirus (HPV). ? More often or less often depending on other medical conditions you have, your age, and other risk factors.  It is your responsibility to get your Pap test results. Ask your health care provider or the department performing the test when your results will be ready.  Keep all follow-up visits as told by your health care provider. This is  important. When should I seek medical care? Talk with your health care provider if:  You have any of these: ? Pain or swelling in your legs. ? Shortness of breath. ? Chest pain. ? Lumps or changes in your breasts or armpits. ? Slurred speech. ? Pain, burning, or bleeding when you urine.  You develop any of these: ? Unusual vaginal bleeding. ? Dizziness or headaches. ? Weakness or numbness in any part of your arms or legs. ? Pain in your abdomen.  This information is not intended to replace advice given to you by your health care provider. Make sure you discuss any questions you have with your health care provider. Document Released: 03/12/2003 Document Revised: 05/10/2016 Document Reviewed: 12/15/2014 Elsevier Interactive Patient Education  2017 [employer].

## 2018-06-04 NOTE — Progress Notes (Signed)
57 y.o. G28P1001 Divorced Black or [place] American Not Hispanic or Latino female here for annual exam.  H/O hysterectomy in 2014 for fibroids. On ERT.  She has an autoimmune disorder and has chronic pain in her lower abdomen to her feet.  Her Mother died this last year which has been very hard on her. She was started on lexapro over the summer. Doesn't feel she is depressed, she went off of the lexapro and is doing okay.  She is on the 0.05 mg estradiol patch, vasomotor symptoms are controlled. Willing to try the lower dose.     Patient's last menstrual period was 10/25/2012 (approximate).          Sexually active: No.  The current method of family planning is post hysterectomy.    Exercising: Yes.    gym Smoker:  no  Health Maintenance: Pap: 04/20/2017 WNL NEG HR HPV History of abnormal Pap:  Yes - 1988, s/p cryo. MMG: 05/04/2017 Birads 1 negative, scheduled next week Colonoscopy:  Normal colonoscopy in 8/17. Doing them every 5 years.  BMD:  12-31-14 low bone mass  TDaP:  02-04-12 Gardasil: N/A   reports that she has never smoked. She has never used smokeless tobacco. She reports that she does not drink alcohol or use drugs. 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 Cares for her elderly father. Dad had a mild stroke. He has Parkinson's. 8 year old Daughter is back at home, she has her masters degree. Currently working at the [employer]  Past Medical History:  Diagnosis Date  . Abnormal Pap smear 1988   treated with cryo  . Anemia 11/2010   hematology consult prior; etiology malabsorption and uterine bleeding  . Anxiety   . Gastric polyp   . GERD (gastroesophageal reflux disease)   . H/O bone density study 12/2007  . H/O hysterectomy for benign disease 5/14  . History of echocardiogram 03/2010   normal LV function, EF 60-65%, mild left atrial enlargement  . HTN (hypertension) 03/2010   hospitalization for HTN urgency  . Hyperlipemia   . Internal hemorrhoids   . Lumbar degenerative  disc disease   . Migraine   . Myalgia   . Spondylosis, cervical   . Umbilical hernia   . Uterine fibroid    hx/o     Past Surgical History:  Procedure Laterality Date  . CERVICAL FUSION  2010  . CERVIX LESION DESTRUCTION  1988  . CESAREAN SECTION    . CHOLECYSTECTOMY  2003  . COLONOSCOPY  08/2010   Dr. Collene Mares  . COLONOSCOPY  02/2017   Dr. Collene Mares  . ESOPHAGOGASTRODUODENOSCOPY  08/2010   Dr. Collene Mares  . EXPLORATORY LAPAROTOMY    . HERNIA REPAIR    . MYOMECTOMY  1998  . PELVIC LAPAROSCOPY    . ROBOTIC ASSISTED LAPAROSCOPIC HYSTERECTOMY AND SALPINGECTOMY  10/2012   [facility]; laproscopic due to fibroids  . UMBILICAL HERNIA REPAIR  10/2013   infected laparoscopic port, mesh placement    Current Outpatient Medications  Medication Sig Dispense Refill  . acetaminophen (TYLENOL) 500 MG tablet Take 1 tablet (500 mg total) by mouth every 6 (six) hours as needed. 30 tablet 0  . amLODipine (NORVASC) 5 MG tablet Take 1 tablet (5 mg total) by mouth daily. 90 tablet 3  . Ascorbic Acid (VITAMIN C) 1000 MG tablet Take 1,000 mg by mouth daily.     Marland Kitchen atorvastatin (LIPITOR) 20 MG tablet Take 1 tablet (20 mg total) by mouth daily. 90 tablet 3  .  B Complex Vitamins (B COMPLEX PO) Take by mouth.    . calcium carbonate (OS-CAL) 600 MG TABS Take 600 mg by mouth daily.     . diclofenac sodium (VOLTAREN) 1 % GEL Apply 4 g topically 4 (four) times daily. 100 g 0  . estradiol (VIVELLE-DOT) 0.05 MG/24HR patch PLACE 1 PATCH (0.05 MG TOTAL) ONTO THE SKIN 2 (TWO) TIMES A WEEK. 24 patch 2  . fish oil-omega-3 fatty acids 1000 MG capsule Take 2 g by mouth daily.    . Ginger, Zingiber officinalis, (GINGER ROOT PO) Take by mouth.    . hydroxychloroquine (PLAQUENIL) 200 MG tablet TAKE 1 TABLET BY MOUTH TWICE DAILY MONDAY THROUGH FRIDAY 120 tablet 0  . Misc Natural Products (TART CHERRY ADVANCED PO) Take by mouth.    . Multiple Vitamin (MULTIVITAMIN) tablet Take 1 tablet by mouth daily.     Marland Kitchen NEXIUM 40 MG capsule Take 1 capsule  by mouth daily. Reported on 12/02/2015    . Probiotic Product (PROBIOTIC ADVANCED PO) Take by mouth.    . TURMERIC PO Take by mouth.     No current facility-administered medications for this visit.     Family History  Problem Relation Age of Onset  . Hypertension Mother   . Dementia Mother   . Colon cancer Mother 91  . Deep vein thrombosis Father   . Hypertension Father   . Cancer Father        prostate CA  . Heart disease Father 25       CAD  . Parkinsonism Father   . Hypertension Sister   . Hyperlipidemia Sister   . Hypertension Brother   . Hyperlipidemia Brother   . Hypertension Maternal Aunt   . Hyperlipidemia Maternal Aunt   . Rheum arthritis Maternal Aunt   . Diabetes Maternal Aunt        1 with type II, 1 with type 1  . Hypertension Brother   . Rheum arthritis Brother   . Diabetes Paternal Aunt        type II    Review of Systems  Constitutional: Negative.   HENT: Positive for sore throat.   Eyes: Negative.   Respiratory: Negative.   Cardiovascular: Negative.   Gastrointestinal: Negative.   Endocrine: Negative.   Genitourinary: Negative.   Musculoskeletal: Positive for back pain.  Allergic/Immunologic: Negative.   Neurological: Negative.   Hematological: Negative.   Psychiatric/Behavioral: Negative.     Exam:   BP 112/68 (BP Location: Right Arm, Patient Position: Sitting, Cuff Size: Normal)   Pulse 80   Ht 5\' 4"  (1.626 m)   Wt 122 lb 3.2 oz (55.4 kg)   LMP 10/25/2012 (Approximate)   BMI 20.98 kg/m   Weight change: @WEIGHTCHANGE @ Height:   Height: 5\' 4"  (162.6 cm)  Ht Readings from Last 3 Encounters:  06/04/18 5\' 4"  (1.626 m)  05/21/18 5\' 4"  (1.626 m)  05/10/18 5\' 4"  (1.626 m)    General appearance: alert, cooperative and appears stated age Head: Normocephalic, without obvious abnormality, atraumatic Neck: no adenopathy, supple, symmetrical, trachea midline and thyroid normal to inspection and palpation Lungs: clear to auscultation  bilaterally Cardiovascular: regular rate and rhythm Breasts: normal appearance, no masses or tenderness Abdomen: soft, non-tender; non distended,  no masses,  no organomegaly Extremities: extremities normal, atraumatic, no cyanosis or edema Skin: Skin color, texture, turgor normal. No rashes or lesions Lymph nodes: Cervical, supraclavicular, and axillary nodes normal. No abnormal inguinal nodes palpated Neurologic: Grossly normal   Pelvic: External  genitalia:  no lesions              Urethra:  normal appearing urethra with no masses, tenderness or lesions              Bartholins and Skenes: normal                 Vagina: normal appearing vagina with normal color and discharge, no lesions              Cervix: absent               Bimanual Exam:  Uterus:  uterus absent              Adnexa: no mass, fullness, tenderness               Rectovaginal: Confirms               Anus:  normal sphincter tone, no lesions  Chaperone was present for exam.  A:  Well Woman with normal exam  Osteopenia  H/O situational depression with loss of her Mother, doing better, off the lexapro  Vasomotor symptoms, tolerable on ERT, willing to go down on her dose  P:   No pap this year  Discussed breast self exam  Discussed calcium and vit D intake  Mammogram next week  Colonoscopy  DEXA  Decrease to the 0.035 mg patch, she will call in a month, if tolerating I will call in refills for the year, if not will go back to the 0.05 mg patch

## 2018-06-11 ENCOUNTER — Ambulatory Visit
Admission: RE | Admit: 2018-06-11 | Discharge: 2018-06-11 | Disposition: A | Discharge status: Home / Self Care | Payer: BC Managed Care – PPO | Source: Ambulatory Visit | Attending: Obstetrics and Gynecology | Admitting: Obstetrics and Gynecology

## 2018-06-11 DIAGNOSIS — Z1231 Encounter for screening mammogram for malignant neoplasm of breast: Secondary | ICD-10-CM

## 2018-06-11 NOTE — Progress Notes (Deleted)
Office Visit Note  Patient: Shari Lawrence             Date of Birth: Jun 29, 1960           MRN: [id number]             PCP: Carlena Hurl, PA-C Referring: Carlena Hurl, PA-C Visit Date: 06/13/2018 Occupation: @GUAROCC @  Subjective:  No chief complaint on file.   History of Present Illness: Shari Lawrence is a 57 y.o. female ***   Activities of Daily Living:  Patient reports morning stiffness for *** {minute/hour:19697}.   Patient {ACTIONS;DENIES/REPORTS:21021675::"Denies"} nocturnal pain.  Difficulty dressing/grooming: {ACTIONS;DENIES/REPORTS:21021675::"Denies"} Difficulty climbing stairs: {ACTIONS;DENIES/REPORTS:21021675::"Denies"} Difficulty getting out of chair: {ACTIONS;DENIES/REPORTS:21021675::"Denies"} Difficulty using hands for taps, buttons, cutlery, and/or writing: {ACTIONS;DENIES/REPORTS:21021675::"Denies"}  No Rheumatology ROS completed.   PMFS History:  Patient Active Problem List   Diagnosis Date Noted  . Vaccine counseling 05/21/2018  . Need for influenza vaccination 05/21/2018  . Routine general medical examination at a health care facility 05/21/2018  . Caregiver burden 07/05/2017  . Cyst of skin 07/05/2017  . Transient elevated blood pressure 06/28/2017  . Leg pain, diffuse, left 06/28/2017  . Leg swelling 06/28/2017  . Bruising 06/28/2017  . Myofascial muscle pain 06/28/2017  . High risk medication use 03/30/2017  . Chronic constipation 11/17/2016  . Globus sensation 11/17/2016  . Primary osteoarthritis of both knees 10/05/2016  . Autoimmune disease ([place]) 09/29/2016  . Primary osteoarthritis of both hands 09/29/2016  . Family history of lupus erythematosus 09/20/2016  . Polyarthralgia 05/06/2016  . Clenching of teeth 05/06/2016  . Finger swelling 05/06/2016  . Morning stiffness of joints 05/06/2016  . Myalgia 05/06/2016  . Family history of rheumatoid arthritis 05/06/2016  . Essential hypertension 12/23/2014  . Hyperlipidemia 12/23/2014   . History of anemia 12/23/2014  . Care provider for parents 12/23/2014  . Impaired fasting glucose 10/25/2011  . Gluten intolerance 10/25/2011  . Gastroesophageal reflux disease without esophagitis 10/25/2011  . Anxiety state   . Iron deficiency anemia     Past Medical History:  Diagnosis Date  . Abnormal Pap smear 1988   treated with cryo  . Anemia 11/2010   hematology consult prior; etiology malabsorption and uterine bleeding  . Anxiety   . Gastric polyp   . GERD (gastroesophageal reflux disease)   . H/O bone density study 12/2007  . H/O hysterectomy for benign disease 5/14  . History of echocardiogram 03/2010   normal LV function, EF 60-65%, mild left atrial enlargement  . HTN (hypertension) 03/2010   hospitalization for HTN urgency  . Hyperlipemia   . Internal hemorrhoids   . Lumbar degenerative disc disease   . Migraine   . Myalgia   . Spondylosis, cervical   . Umbilical hernia   . Uterine fibroid    hx/o     Family History  Problem Relation Age of Onset  . Hypertension Mother   . Dementia Mother   . Colon cancer Mother 5  . Deep vein thrombosis Father   . Hypertension Father   . Cancer Father        prostate CA  . Heart disease Father 67       CAD  . Parkinsonism Father   . Hypertension Sister   . Hyperlipidemia Sister   . Hypertension Brother   . Hyperlipidemia Brother   . Hypertension Maternal Aunt   . Hyperlipidemia Maternal Aunt   . Rheum arthritis Maternal Aunt   . Diabetes Maternal Aunt  1 with type II, 1 with type 1  . Hypertension Brother   . Rheum arthritis Brother   . Diabetes Paternal Aunt        type II   Past Surgical History:  Procedure Laterality Date  . CERVICAL FUSION  2010  . CERVIX LESION DESTRUCTION  1988  . CESAREAN SECTION    . CHOLECYSTECTOMY  2003  . COLONOSCOPY  08/2010   Dr. Collene Mares  . COLONOSCOPY  02/2017   Dr. Collene Mares  . ESOPHAGOGASTRODUODENOSCOPY  08/2010   Dr. Collene Mares  . EXPLORATORY LAPAROTOMY    . HERNIA REPAIR     . MYOMECTOMY  1998  . PELVIC LAPAROSCOPY    . ROBOTIC ASSISTED LAPAROSCOPIC HYSTERECTOMY AND SALPINGECTOMY  10/2012   [facility]; laproscopic due to fibroids  . 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  Has 51 yo daughter.  Her siblings and her don't agree on helping take care of their parents, causes family tension.  As of 11/2014    Objective: Vital Signs: LMP 10/25/2012 (Approximate)    Physical Exam   Musculoskeletal Exam: ***  CDAI Exam: CDAI Score: Not documented Patient Global Assessment: Not documented; Provider Global Assessment: Not documented Swollen: Not documented; Tender: Not documented Joint Exam   Not documented   There is currently no information documented on the homunculus. Go to the Rheumatology activity and complete the homunculus joint exam.  Investigation: No additional findings.  Imaging: No results found.  Recent Labs: Lab Results  Component Value Date   WBC 4.2 05/21/2018   HGB 11.5 05/21/2018   PLT 151 05/21/2018   NA 141 05/21/2018   K 3.6 05/21/2018   CL 103 05/21/2018   CO2 23 05/21/2018   GLUCOSE 78 05/21/2018   BUN 12 05/21/2018   CREATININE 0.82 05/21/2018   BILITOT 0.4 05/21/2018   ALKPHOS 55 05/21/2018   AST 18 05/21/2018   ALT 22 05/21/2018   PROT 6.6 05/21/2018   ALBUMIN 4.5 05/21/2018   CALCIUM 9.8 05/21/2018   GFRAA 93 05/21/2018    Speciality Comments: PLQ Eye Exam: 03/09/18 WNL @ [facility]   Procedures:  No procedures performed Allergies: Quinolones; Latex; Levaquin [levofloxacin]; Penicillins; Percocet [oxycodone-acetaminophen]; Sulfa drugs cross reactors; Vicodin [hydrocodone-acetaminophen]; and  Gluten meal   Assessment / Plan:     Visit Diagnoses: Autoimmune disease ([place]) - ANA 1:320 NO, ds8, RF-,CCP-,History of nasal ulcers and oral ulcers in the past, positive synovitis in left hand MCPs on [place] previously.  High risk medication use -  PLQ twice daily Monday through Friday.    Primary osteoarthritis of both hands  Primary osteoarthritis of both knees  Myofascial muscle pain  Trochanteric bursitis of both hips  History of anemia  History of hyperlipidemia  History of hypertension  History of anxiety  Family history of rheumatoid arthritis  Family history of lupus erythematosus  History of gastroesophageal reflux (GERD)   Orders: No orders of the defined types were placed in this encounter.  No orders of the defined types were placed in this encounter.   Face-to-face time spent with patient was *** minutes. Greater than 50% of time was spent in counseling and coordination of care.  Follow-Up Instructions:  No follow-ups on file.   Ofilia Neas, PA-C  Note - This record has been created using Dragon software.  Chart creation errors have been sought, but may not always  have been located. Such creation errors do not reflect on  the standard of medical care.

## 2018-06-13 ENCOUNTER — Ambulatory Visit: Payer: BC Managed Care – PPO | Admitting: Physician Assistant

## 2018-06-13 ENCOUNTER — Ambulatory Visit: Payer: BC Managed Care – PPO | Admitting: Rheumatology

## 2018-06-13 ENCOUNTER — Encounter: Payer: Self-pay | Admitting: Rheumatology

## 2018-06-13 ENCOUNTER — Ambulatory Visit (INDEPENDENT_AMBULATORY_CARE_PROVIDER_SITE_OTHER): Payer: BC Managed Care – PPO

## 2018-06-13 VITALS — BP 124/84 | HR 61 | Resp 14 | Ht 64.0 in | Wt 129.0 lb

## 2018-06-13 DIAGNOSIS — Z840 Family history of diseases of the skin and subcutaneous tissue: Secondary | ICD-10-CM

## 2018-06-13 DIAGNOSIS — M25551 Pain in right hip: Secondary | ICD-10-CM

## 2018-06-13 DIAGNOSIS — M359 Systemic involvement of connective tissue, unspecified: Secondary | ICD-10-CM

## 2018-06-13 DIAGNOSIS — M25552 Pain in left hip: Secondary | ICD-10-CM

## 2018-06-13 DIAGNOSIS — Z8679 Personal history of other diseases of the circulatory system: Secondary | ICD-10-CM

## 2018-06-13 DIAGNOSIS — M7918 Myalgia, other site: Secondary | ICD-10-CM

## 2018-06-13 DIAGNOSIS — Z8639 Personal history of other endocrine, nutritional and metabolic disease: Secondary | ICD-10-CM

## 2018-06-13 DIAGNOSIS — Z8659 Personal history of other mental and behavioral disorders: Secondary | ICD-10-CM

## 2018-06-13 DIAGNOSIS — Z8261 Family history of arthritis: Secondary | ICD-10-CM

## 2018-06-13 DIAGNOSIS — M7061 Trochanteric bursitis, right hip: Secondary | ICD-10-CM

## 2018-06-13 DIAGNOSIS — M19041 Primary osteoarthritis, right hand: Secondary | ICD-10-CM

## 2018-06-13 DIAGNOSIS — M7062 Trochanteric bursitis, left hip: Secondary | ICD-10-CM

## 2018-06-13 DIAGNOSIS — M170 Bilateral primary osteoarthritis of knee: Secondary | ICD-10-CM

## 2018-06-13 DIAGNOSIS — M62838 Other muscle spasm: Secondary | ICD-10-CM | POA: Diagnosis not present

## 2018-06-13 DIAGNOSIS — Z79899 Other long term (current) drug therapy: Secondary | ICD-10-CM

## 2018-06-13 DIAGNOSIS — M19042 Primary osteoarthritis, left hand: Secondary | ICD-10-CM

## 2018-06-13 DIAGNOSIS — Z862 Personal history of diseases of the blood and blood-forming organs and certain disorders involving the immune mechanism: Secondary | ICD-10-CM

## 2018-06-13 NOTE — Progress Notes (Signed)
Office Visit Note  Patient: Shari Lawrence             Date of Birth: [birth year]           MRN: [id number]             PCP: Carlena Hurl, PA-C Referring: Carlena Hurl, PA-C Visit Date: 06/13/2018 Occupation: @GUAROCC @  Subjective:  Generalized pain   History of Present Illness: Shari Lawrence is a 57 y.o. female with history of autoimmune disease and osteoarthritis. She is taking PLQ 200 mg 1 tab BID M-F.  She reports she has been having increased pain in both hands.  She describes it as an intermittent aching.  She has noticed decreased grip strength.  She continues to have generalized muscle aches and tenderness.  She has bilateral trochanteric bursitis.  She states the pain is most severe after sitting for prolonged periods of time.  She gets a massage on a regular basis.  She continues to have sicca symptoms.  She has chronic fatigue.  She denies any recent rashes.  She denies any sores in mouth or nose, but she reports she has pain in her mouth and nose at time.s   Activities of Daily Living:  Patient reports morning stiffness for 1  hour.   Patient Reports nocturnal pain.  Difficulty dressing/grooming: Denies Difficulty climbing stairs: Reports Difficulty getting out of chair: Reports Difficulty using hands for taps, buttons, cutlery, and/or writing: Reports  Review of Systems  Constitutional: Positive for fatigue.  HENT: Positive for mouth dryness. Negative for mouth sores and nose dryness.   Eyes: Positive for dryness. Negative for pain and visual disturbance.  Respiratory: Negative for cough, hemoptysis, shortness of breath and difficulty breathing.   Cardiovascular: Negative for chest pain, palpitations, hypertension and swelling in legs/feet.  Gastrointestinal: Positive for constipation. Negative for blood in stool and diarrhea.  Endocrine: Negative for increased urination.  Genitourinary: Negative for painful urination.  Musculoskeletal: Positive for arthralgias  and joint pain. Negative for joint swelling, myalgias, muscle weakness, morning stiffness, muscle tenderness and myalgias.  Skin: Negative for color change, pallor, rash, hair loss, nodules/bumps, skin tightness, ulcers and sensitivity to sunlight.  Allergic/Immunologic: Negative for susceptible to infections.  Neurological: Negative for dizziness, numbness, headaches and weakness.  Hematological: Negative for swollen glands.  Psychiatric/Behavioral: Positive for depressed mood and sleep disturbance (She takes melatonin). The patient is nervous/anxious.     PMFS History:  Patient Active Problem List   Diagnosis Date Noted  . Vaccine counseling 05/21/2018  . Need for influenza vaccination 05/21/2018  . Routine general medical examination at a health care facility 05/21/2018  . Caregiver burden 07/05/2017  . Cyst of skin 07/05/2017  . Transient elevated blood pressure 06/28/2017  . Leg pain, diffuse, left 06/28/2017  . Leg swelling 06/28/2017  . Bruising 06/28/2017  . Myofascial muscle pain 06/28/2017  . High risk medication use 03/30/2017  . Chronic constipation 11/17/2016  . Globus sensation 11/17/2016  . Primary osteoarthritis of both knees 10/05/2016  . Autoimmune disease ([place]) 09/29/2016  . Primary osteoarthritis of both hands 09/29/2016  . Family history of lupus erythematosus 09/20/2016  . Polyarthralgia 05/06/2016  . Clenching of teeth 05/06/2016  . Finger swelling 05/06/2016  . Morning stiffness of joints 05/06/2016  . Myalgia 05/06/2016  . Family history of rheumatoid arthritis 05/06/2016  . Essential hypertension 12/23/2014  . Hyperlipidemia 12/23/2014  . History of anemia 12/23/2014  . Care provider for parents 12/23/2014  . Impaired fasting  glucose 10/25/2011  . Gluten intolerance 10/25/2011  . Gastroesophageal reflux disease without esophagitis 10/25/2011  . Anxiety state   . Iron deficiency anemia     Past Medical History:  Diagnosis Date  . Abnormal Pap  smear 1988   treated with cryo  . Anemia 11/2010   hematology consult prior; etiology malabsorption and uterine bleeding  . Anxiety   . Gastric polyp   . GERD (gastroesophageal reflux disease)   . H/O bone density study 12/2007  . H/O hysterectomy for benign disease 5/14  . History of echocardiogram 03/2010   normal LV function, EF 60-65%, mild left atrial enlargement  . HTN (hypertension) 03/2010   hospitalization for HTN urgency  . Hyperlipemia   . Internal hemorrhoids   . Lumbar degenerative disc disease   . Migraine   . Myalgia   . Spondylosis, cervical   . Umbilical hernia   . Uterine fibroid    hx/o     Family History  Problem Relation Age of Onset  . Hypertension Mother   . Dementia Mother   . Colon cancer Mother 89  . Deep vein thrombosis Father   . Hypertension Father   . Cancer Father        prostate CA  . Heart disease Father 27       CAD  . Parkinsonism Father   . Hypertension Sister   . Hyperlipidemia Sister   . Hypertension Brother   . Hyperlipidemia Brother   . Hypertension Maternal Aunt   . Hyperlipidemia Maternal Aunt   . Rheum arthritis Maternal Aunt   . Diabetes Maternal Aunt        1 with type II, 1 with type 1  . Hypertension Brother   . Rheum arthritis Brother   . Diabetes Paternal Aunt        type II  . Breast cancer Neg Hx    Past Surgical History:  Procedure Laterality Date  . CERVICAL FUSION  2010  . CERVIX LESION DESTRUCTION  1988  . CESAREAN SECTION    . CHOLECYSTECTOMY  2003  . COLONOSCOPY  08/2010   Dr. Collene Mares  . COLONOSCOPY  02/2017   Dr. Collene Mares  . ESOPHAGOGASTRODUODENOSCOPY  08/2010   Dr. Collene Mares  . EXPLORATORY LAPAROTOMY    . HERNIA REPAIR    . MYOMECTOMY  1998  . PELVIC LAPAROSCOPY    . ROBOTIC ASSISTED LAPAROSCOPIC HYSTERECTOMY AND SALPINGECTOMY  10/2012   [facility]; laproscopic due to fibroids  . 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  Has 64 yo daughter.  Her siblings and her don't agree on helping take care of their parents, causes family tension.  As of 11/2014    Objective: Vital Signs: BP 124/84 (BP Location: Left Arm, Patient Position: Sitting, Cuff Size: Normal)   Pulse 61   Resp 14   Ht 5\' 4"  (1.626 m)   Wt 129 lb (58.5 kg)   LMP 10/25/2012 (Approximate)   BMI 22.14 kg/m    Physical Exam Vitals signs and nursing note reviewed.  Constitutional:      Appearance: She is well-developed.  HENT:     Head: Normocephalic and atraumatic.     Comments: No  parotid swelling    Mouth/Throat:     Comments: No oral or nasal ulcerations. Eyes:     Conjunctiva/sclera: Conjunctivae normal.  Neck:     Musculoskeletal: Normal range of motion.  Cardiovascular:     Rate and Rhythm: Normal rate and regular rhythm.     Heart sounds: Normal heart sounds.  Pulmonary:     Effort: Pulmonary effort is normal.     Breath sounds: Normal breath sounds.  Abdominal:     General: Bowel sounds are normal.     Palpations: Abdomen is soft.  Lymphadenopathy:     Cervical: No cervical adenopathy.  Skin:    General: Skin is warm and dry.     Capillary Refill: Capillary refill takes less than 2 seconds.     Comments: No digital ulcerations or signs of gangrene.  No malar rash noted.  Neurological:     Mental Status: She is alert and oriented to person, place, and time.  Psychiatric:        Behavior: Behavior normal.      Musculoskeletal Exam: C-spine, thoracic spine, and lumbar spine good ROM.  No midline spinal tenderness.  No SI joint tenderness.  Shoulder joints, elbow joints, wrist joints, MCPs, PIPs, and DIPs good ROM with no synovitis.  Complete fist formation bilaterally.  Hip joints, knee joints,  ankle joints, MTPs, PIPs, and DIPs good ROM with no synovitis.  No warmth or effusion of knee joints.  No tenderness or swelling of ankle joints.  Tenderness of bilateral trochanteric bursa bilaterally.   CDAI Exam: CDAI Score: Not documented Patient Global Assessment: Not documented; Provider Global Assessment: Not documented Swollen: Not documented; Tender: Not documented Joint Exam   Not documented   There is currently no information documented on the homunculus. Go to the Rheumatology activity and complete the homunculus joint exam.  Investigation: No additional findings.  Imaging: Mm 3d Screen Breast Bilateral  Result Date: 06/12/2018 CLINICAL DATA:  Screening. EXAM: DIGITAL SCREENING BILATERAL MAMMOGRAM WITH TOMO AND CAD COMPARISON:  Previous exam(s). ACR Breast Density Category c: The breast tissue is heterogeneously dense, which may obscure small masses. FINDINGS: There are no findings suspicious for malignancy. Images were processed with CAD. IMPRESSION: No mammographic evidence of malignancy. A result letter of this screening mammogram will be mailed directly to the patient. RECOMMENDATION: Screening mammogram in one year. (Code:SM-B-01Y) BI-RADS CATEGORY  1: Negative. Electronically Signed   By: Lajean Manes M.D.   On: 06/12/2018 11:05   Xr Hips Bilat W Or W/o Pelvis 3-4 Views  Result Date: 06/13/2018 No hip joint narrowing was noted.  No chondrocalcinosis was noted.  No SI joint narrowing or sclerosis was noted.  Mild scoliosis in the lumbar spine was noted. Impression: Unremarkable x-ray of the hip joints.   Recent Labs: Lab Results  Component Value Date   WBC 4.2 05/21/2018   HGB 11.5 05/21/2018   PLT 151 05/21/2018   NA 141 05/21/2018   K 3.6 05/21/2018   CL 103 05/21/2018   CO2 23 05/21/2018   GLUCOSE 78 05/21/2018   BUN 12 05/21/2018   CREATININE 0.82 05/21/2018   BILITOT 0.4 05/21/2018   ALKPHOS 55 05/21/2018   AST 18 05/21/2018   ALT 22 05/21/2018    PROT 6.6 05/21/2018   ALBUMIN 4.5 05/21/2018   CALCIUM 9.8 05/21/2018   GFRAA 93 05/21/2018    Speciality Comments: PLQ Eye Exam: 03/09/18 WNL @ [facility]   Procedures:  No procedures performed Allergies: Quinolones; Latex;  Levaquin [levofloxacin]; Penicillins; Percocet [oxycodone-acetaminophen]; Sulfa drugs cross reactors; Vicodin [hydrocodone-acetaminophen]; and Gluten meal   Assessment / Plan:     Visit Diagnoses: Autoimmune disease ([place]) - ANA 1:320 NO, ds8, RF-,CCP-,History of nasal ulcers and oral ulcers in the past, positive synovitis in left hand MCPs on [place] previously: She is clinically doing well on Plaquenil 200 mg BID M-F.  She had autoimmune labs checked on 01/05/18 and dsDNA was elevated but stable, complements WNL, sed rate WNL, and UA WNL.  She has no signs or symptoms of a flare. She has no active synovitis on exam.  She has no oral or nasal ulcerations on exam.  She reports sicca symptoms but no parotid swelling noted. She has no symptoms of Raynaud's and no digital ulcerations were noted. She has no cervical lymphadenopathy.  She continues to have chronic fatigue but no low grade fevers. She has not had any recent rashes.  No malar rash noted on exam.  No hair loss.  She will continue on PLQ 200 mg BID M-F.  Future orders for autoimmune labs were placed today.  She was advised to notify us if she develops any new or worsening symptoms.  She will follow up in 5 months. - Plan: Anti-DNA antibody, double-stranded, C3 and C4, Sedimentation rate, Urinalysis, Routine w reflex microscopic  High risk medication use - PLQ twice daily Monday through Friday. CBC and CMP were WNL on 05/21/18.  She will be due for lab work in April and every 5 months.  Future orders were placed today.   Trapezius muscle spasm: She has trapezius muscle spasms and muscle tension bilaterally.  She has massages on a regular basis.   Primary osteoarthritis of both hands: She has very minimal PIP and DIP  synovial thickening.  She has no synovitis on exam.  She has been having increased stiffness and aching in both hands, which she attributes to the cold weather.  She was given a handout of hand exercises.  Joint protection and muscle strengthening were discussed.   Primary osteoarthritis of both knees: She has no warmth or effusion. She has good ROM of both knee joints. She takes natural anti-inflammatories and uses voltaren gel PRN.   Myofascial muscle pain: She has generalized muscle aches and muscle tenderness.  She continues to have bilateral trochanteric bursitis. She was given a handout of exercises to perform at home.  She has massages on a regular basis. She continues to have chronic fatigue and insomnia.    Trochanteric bursitis of both hips: She has tenderness of bilateral trochanteric bursa on exam.  She was given a handout of exercises to perform.   Pain of both hip joints: She gave a history of bilateral hip pain and groin pain.  She had good ROM on exam.  She has tenderness of bilateral trochanteric bursa.  She requested x-rays of both hips which were unremarkable.  She was given a handout of exercises for trochanteric bursitis.   Other medical conditions are listed as follows:   Family history of rheumatoid arthritis  Family history of lupus erythematosus  History of anemia  History of hyperlipidemia  History of hypertension  History of anxiety    Orders: Orders Placed This Encounter  Procedures  . XR HIPS BILAT W OR W/O PELVIS 3-4 VIEWS  . Anti-DNA antibody, double-stranded  . C3 and C4  . Sedimentation rate  . Urinalysis, Routine w reflex microscopic   No orders of the defined types were placed in this encounter.  Face-to-face time spent with patient was 30 minutes. Greater than 50% of time was spent in counseling and coordination of care.  Follow-Up Instructions: Return in about 5 months (around 11/12/2018) for Autoimmune Disease, Osteoarthritis.   Ofilia Neas, PA-C  Note - This record has been created using Dragon software.  Chart creation errors have been sought, but may not always  have been located. Such creation errors do not reflect on  the standard of medical care.

## 2018-06-13 NOTE — Patient Instructions (Addendum)
Trochanteric Bursitis Rehab Ask your health care provider which exercises are safe for you. Do exercises exactly as told by your health care provider and adjust them as directed. It is normal to feel mild stretching, pulling, tightness, or discomfort as you do these exercises, but you should stop right away if you feel sudden pain or your pain gets worse.Do not begin these exercises until told by your health care provider. Stretching exercises These exercises warm up your muscles and joints and improve the movement and flexibility of your hip. These exercises also help to relieve pain and stiffness. Exercise A: Iliotibial band stretch  1. Lie on your side with your left / right leg in the top position. 2. Bend your left / right knee and grab your ankle. 3. Slowly bring your knee back so your thigh is behind your body. 4. Slowly lower your knee toward the floor until you feel a gentle stretch on the outside of your left / right thigh. If you do not feel a stretch and your knee will not fall farther, place the heel of your other foot on top of your outer knee and pull your thigh down farther. 5. Hold this position for __________ seconds. 6. Slowly return to the starting position. Repeat __________ times. Complete this exercise __________ times a day. Strengthening exercises These exercises build strength and endurance in your hip and pelvis. Endurance is the ability to use your muscles for a long time, even after they get tired. Exercise B: Bridge (hip extensors)  1. Lie on your back on a firm surface with your knees bent and your feet flat on the floor. 2. Tighten your buttocks muscles and lift your buttocks off the floor until your trunk is level with your thighs. You should feel the muscles working in your buttocks and the back of your thighs. If this exercise is too easy, try doing it with your arms crossed over your chest. 3. Hold this position for __________ seconds. 4. Slowly return to the  starting position. 5. Let your muscles relax completely between repetitions. Repeat __________ times. Complete this exercise __________ times a day. Exercise C: Squats (knee extensors and  quadriceps) 1. Stand in front of a table, with your feet and knees pointing straight ahead. You may rest your hands on the table for balance but not for support. 2. Slowly bend your knees and lower your hips like you are going to sit in a chair. ? Keep your weight over your heels, not over your toes. ? Keep your lower legs upright so they are parallel with the table legs. ? Do not let your hips go lower than your knees. ? Do not bend lower than told by your health care provider. ? If your hip pain increases, do not bend as low. 3. Hold this position for __________ seconds. 4. Slowly push with your legs to return to standing. Do not use your hands to pull yourself to standing. Repeat __________ times. Complete this exercise __________ times a day. Exercise D: Hip hike 1. Stand sideways on a bottom step. Stand on your left / right leg with your other foot unsupported next to the step. You can hold onto the railing or wall if needed for balance. 2. Keeping your knees straight and your torso square, lift your left / right hip up toward the ceiling. 3. Hold this position for __________ seconds. 4. Slowly let your left / right hip lower toward the floor, past the starting position. Your foot should  get closer to the floor. Do not lean or bend your knees. Repeat __________ times. Complete this exercise __________ times a day. Exercise E: Single leg stand 1. Stand near a counter or door frame that you can hold onto for balance as needed. It is helpful to stand in front of a mirror for this exercise so you can watch your hip. 2. Squeeze your left / right buttock muscles then lift up your other foot. Do not let your left / right hip push out to the side. 3. Hold this position for __________ seconds. Repeat __________  times. Complete this exercise __________ times a day. This information is not intended to replace advice given to you by your health care provider. Make sure you discuss any questions you have with your health care provider. Document Released: 07/21/2004 Document Revised: 02/18/2016 Document Reviewed: 05/29/2015 Elsevier Interactive Patient Education  2019 [employer] Hand Exercises Hand exercises can be helpful to almost anyone. These exercises can strengthen the hands, improve flexibility and movement, and increase blood flow to the hands. These results can make work and daily tasks easier. Hand exercises can be especially helpful for people who have joint pain from arthritis or have nerve damage from overuse (carpal tunnel syndrome). These exercises can also help people who have injured a hand. Most of these hand exercises are fairly gentle stretching routines. You can do them often throughout the day. Still, it is a good idea to ask your health care provider which exercises would be best for you. Warming your hands before exercise may help to reduce stiffness. You can do this with gentle massage or by placing your hands in warm water for 15 minutes. Also, make sure you pay attention to your level of hand pain as you begin an exercise routine. Exercises Knuckle bend Repeat this exercise 5-10 times with each hand. 7. Stand or sit with your arm, hand, and all five fingers pointed straight up. Make sure your wrist is straight. 8. Gently and slowly bend your fingers down and inward until the tips of your fingers are touching the tops of your palm. 9. Hold this position for a few seconds. 10. Extend your fingers out to their original position, all pointing straight up again. Finger fan Repeat this exercise 5-10 times with each hand. 6. Hold your arm and hand out in front of you. Keep your wrist straight. 7. Squeeze your hand into a fist. 8. Hold this position for a few seconds. 9. Fan out, or  spread apart, your hand and fingers as much as possible, stretching every joint fully. Tabletop Repeat this exercise 5-10 times with each hand. 1. Stand or sit with your arm, hand, and all five fingers pointed straight up. Make sure your wrist is straight. 2. Gently and slowly bend your fingers at the knuckles where they meet the hand until your hand is making an upside-down L shape. Your fingers should form a tabletop. 3. Hold this position for a few seconds. 4. Extend your fingers out to their original position, all pointing straight up again. Making Os Repeat this exercise 5-10 times with each hand. 5. Stand or sit with your arm, hand, and all five fingers pointed straight up. Make sure your wrist is straight. 6. Make an O shape by touching your pointer finger to your thumb. Hold for a few seconds. Then open your hand wide. 7. Repeat this motion with each finger on your hand. Table spread Repeat this exercise 5-10 times with each hand. 4.  Place your hand on a table with your palm facing down. Make sure your wrist is straight. 5. Spread your fingers out as much as possible. Hold this position for a few seconds. 6. Slide your fingers back together again. Hold for a few seconds. Ball grip Repeat this exercise 10-15 times with each hand. 1. Hold a tennis ball or another soft ball in your hand. 2. While slowly increasing pressure, squeeze the ball as hard as possible. 3. Squeeze as hard as you can for 3-5 seconds. 4. Relax and repeat.  Wrist curls Repeat this exercise 10-15 times with each hand. 1. Sit in a chair that has armrests. 2. Hold a light weight in your hand, such as a dumbbell that weighs 1-3 pounds (0.5-1.4 kg). Ask your health care provider what weight would be best for you. 3. Rest your hand just over the end of the chair arm with your palm facing up. 4. Gently pivot your wrist up and down while holding the weight. Do not twist your wrist from side to side. Contact a health  care provider if:  Your hand pain or discomfort gets much worse when you do an exercise.  Your hand pain or discomfort does not improve within 2 hours after you exercise. If you have any of these problems, stop doing these exercises right away. Do not do them again unless your health care provider says that you can. Get help right away if:  You develop sudden, severe hand pain. If this happens, stop doing these exercises right away. Do not do them again unless your health care provider says that you can. This information is not intended to replace advice given to you by your health care provider. Make sure you discuss any questions you have with your health care provider. Document Released: 05/25/2015 Document Revised: 10/17/2017 Document Reviewed: 12/22/2014 Elsevier Interactive Patient Education  2019 [place] We placed an order today for your standing lab work.    Please come back and get your standing labs in April and every 5 months  We have open lab Monday through Friday from 8:30-11:30 AM and 1:30-4:00 PM  at the office of Dr. [name].   You may experience shorter wait times on Monday and Friday afternoons. The office is located at [address], [place], [place], [place] [address] No appointment is necessary.   Labs are drawn by [facility].  You may receive a bill from [facility] for your lab work.  If you wish to have your labs drawn at another location, please call the office 24 hours in advance to send orders.  If you have any questions regarding directions or hours of operation,  please call [phone].   Just as a reminder please drink plenty of water prior to coming for your lab work. Thanks!

## 2018-06-15 ENCOUNTER — Ambulatory Visit: Payer: BC Managed Care – PPO | Admitting: Rheumatology

## 2018-07-16 ENCOUNTER — Ambulatory Visit: Payer: BC Managed Care – PPO | Admitting: Medical

## 2018-07-16 ENCOUNTER — Encounter: Payer: Self-pay | Admitting: Medical

## 2018-07-16 VITALS — Temp 98.1°F | Resp 16 | Ht 64.0 in | Wt 123.2 lb

## 2018-07-16 DIAGNOSIS — F430 Acute stress reaction: Secondary | ICD-10-CM | POA: Diagnosis not present

## 2018-07-16 DIAGNOSIS — R42 Dizziness and giddiness: Secondary | ICD-10-CM | POA: Diagnosis not present

## 2018-07-16 DIAGNOSIS — R110 Nausea: Secondary | ICD-10-CM | POA: Diagnosis not present

## 2018-07-16 DIAGNOSIS — I959 Hypotension, unspecified: Secondary | ICD-10-CM | POA: Diagnosis not present

## 2018-07-16 MED ORDER — MECLIZINE HCL 25 MG PO TABS: 25.0000 mg | ORAL_TABLET | Freq: Two times a day (BID) | ORAL | 1 refills | Status: DC

## 2018-07-16 MED ORDER — ONDANSETRON HCL 4 MG PO TABS: 4.0000 mg | ORAL_TABLET | Freq: Three times a day (TID) | ORAL | 0 refills | Status: DC | PRN

## 2018-07-16 NOTE — Patient Instructions (Signed)
Recommendations  Try to keep your water intake consistent daily  Do not wait until you feel thirsty, but hydrate throughout the day  Hydrate enough to keep your urine clear  Do not take your blood pressure pill tomorrow  On Wednesday, lets change your blood pressure medication to 1/2 tablet daily which would be 2.5 mg of amlodipine instead of the 5 mg you are currently taking  Do not be obsessive about your blood pressure, but check periodically to make sure we're running in the 120/70 range  I sent meclizine to your pharmacy to help with vertigo  I sent Zofran to your pharmacy to help with nausea  For the next 1 to 2 days avoid sudden motions or getting up too quickly from a seated or lying position  Last see how you feel over the next week with the lower blood pressure dose  If your blood pressures are running normal but you continue to have the sudden vertigo attacks, the next step would be to send you to physical therapy to do some vertigo maneuvers

## 2018-07-16 NOTE — Progress Notes (Signed)
Subjective: Chief Complaint  Patient presents with  . dizzy    room spinning since 10 pm,  low BP   Here for dizziness, vertigo, low BP.   EMS this morning, EKG fine 139/89

## 2018-07-17 DIAGNOSIS — R42 Dizziness and giddiness: Secondary | ICD-10-CM | POA: Insufficient documentation

## 2018-07-17 DIAGNOSIS — I959 Hypotension, unspecified: Secondary | ICD-10-CM | POA: Insufficient documentation

## 2018-07-17 DIAGNOSIS — R110 Nausea: Secondary | ICD-10-CM | POA: Insufficient documentation

## 2018-07-17 MED ORDER — AMLODIPINE BESYLATE 5 MG PO TABS: 2.5000 mg | ORAL_TABLET | Freq: Every day | ORAL | 3 refills | Status: DC

## 2018-07-17 NOTE — Progress Notes (Signed)
Patient ID: Shari Lawrence, female   DOB: [birth year], 58 y.o.   MRN: [id number]

## 2018-07-17 NOTE — Progress Notes (Signed)
Subjective:     Patient ID: Shari Lawrence, female   DOB: Jan 23, 1961, 58 y.o.   MRN: [id number]  HPI Here for dizziness, vertigo, low BP.   She has been under some stress, has had a recent death in the family, hasn't been eating as well, hydrating somewhat good though.   She started feeling bad this morning.  In the past week BPs have mostly been low 100s/70s with some readings <100 SBP and less than <70 DBP.   Has felt fatigued, tired.    This morning had vertigo attack.  She has had these in the past.  EMS was called, EKG was fine, and at that time BP was 139/89.  But most recent readings have been low.   Still taking Amlodipine 5mg  daily except didn't take it this morning.   No chest pain, no dyspnea, no edema, no numbness, no tingling, no vision or hearing changes, just dizziness, nausea at times, brief vertigo attacks.   No other aggravating or relieving factors. No other complaint.  Past Medical History:  Diagnosis Date  . Abnormal Pap smear 1988   treated with cryo  . Anemia 11/2010   hematology consult prior; etiology malabsorption and uterine bleeding  . Anxiety   . Gastric polyp   . GERD (gastroesophageal reflux disease)   . H/O bone density study 12/2007  . H/O hysterectomy for benign disease 5/14  . History of echocardiogram 03/2010   normal LV function, EF 60-65%, mild left atrial enlargement  . HTN (hypertension) 03/2010   hospitalization for HTN urgency  . Hyperlipemia   . Internal hemorrhoids   . Lumbar degenerative disc disease   . Migraine   . Myalgia   . Spondylosis, cervical   . Umbilical hernia   . Uterine fibroid    hx/o    Current Outpatient Medications on File Prior to Visit  Medication Sig Dispense Refill  . acetaminophen (TYLENOL) 500 MG tablet Take 1 tablet (500 mg total) by mouth every 6 (six) hours as needed. 30 tablet 0  . Ascorbic Acid (VITAMIN C) 1000 MG tablet Take 1,000 mg by mouth daily.     Marland Kitchen atorvastatin (LIPITOR) 20 MG tablet Take 1 tablet (20 mg  total) by mouth daily. 90 tablet 3  . B Complex Vitamins (B COMPLEX PO) Take by mouth.    . calcium carbonate (OS-CAL) 600 MG TABS Take 600 mg by mouth daily.     . diclofenac sodium (VOLTAREN) 1 % GEL Apply 4 g topically 4 (four) times daily. 100 g 0  . estradiol (VIVELLE-DOT) 0.0375 MG/24HR Place 1 patch onto the skin 2 (two) times a week. 8 patch 1  . fish oil-omega-3 fatty acids 1000 MG capsule Take 2 g by mouth daily.    . Ginger, Zingiber officinalis, (GINGER ROOT PO) Take by mouth.    . hydroxychloroquine (PLAQUENIL) 200 MG tablet TAKE 1 TABLET BY MOUTH TWICE DAILY MONDAY THROUGH FRIDAY 120 tablet 0  . Misc Natural Products (TART CHERRY ADVANCED PO) Take by mouth.    . Multiple Vitamin (MULTIVITAMIN) tablet Take 1 tablet by mouth daily.     Marland Kitchen NEXIUM 40 MG capsule Take 1 capsule by mouth daily. Reported on 12/02/2015    . Probiotic Product (PROBIOTIC ADVANCED PO) Take by mouth.    . TURMERIC PO Take by mouth.     No current facility-administered medications on file prior to visit.    ROS as in subjective     Objective:   Physical  Exam HENT:     Head: Normocephalic and atraumatic.     Right Ear: Tympanic membrane, ear canal and external ear normal.     Left Ear: Tympanic membrane, ear canal and external ear normal.     Nose: Nose normal.     Mouth/Throat:     Mouth: Mucous membranes are moist.     Pharynx: Oropharynx is clear.  Eyes:     Extraocular Movements: Extraocular movements intact.     Conjunctiva/sclera: Conjunctivae normal.     Pupils: Pupils are equal, round, and reactive to light.  Neck:     Musculoskeletal: Normal range of motion and neck supple.  Cardiovascular:     Rate and Rhythm: Regular rhythm. Bradycardia present.     Pulses: Normal pulses.     Heart sounds: Normal heart sounds.  Pulmonary:     Effort: Pulmonary effort is normal.     Breath sounds: Normal breath sounds.  Abdominal:     General: Abdomen is flat. Bowel sounds are normal. There is no  distension.     Palpations: Abdomen is soft. There is no mass.     Tenderness: There is abdominal tenderness. There is no guarding or rebound.     Hernia: No hernia is present.     Comments: Mild tenderness in general  Musculoskeletal: Normal range of motion.  Skin:    General: Skin is warm and dry.     Capillary Refill: Capillary refill takes less than 2 seconds.     Coloration: Skin is pale.  Neurological:     Mental Status: She is alert and oriented to person, place, and time. Mental status is at baseline.     Cranial Nerves: No cranial nerve deficit.     Sensory: No sensory deficit.     Motor: No weakness.     Coordination: Coordination normal.     Gait: Gait normal.     Deep Tendon Reflexes: Reflexes normal.     Comments: Became dizzy and had a vertigo spell as she lied down to supine  Psychiatric:        Mood and Affect: Mood normal.        Behavior: Behavior normal.        Thought Content: Thought content normal.        Judgment: Judgment normal.          Assessment:     Encounter Diagnoses  Name Primary?  . Hypotension, unspecified hypotension type Yes  . Vertigo   . Nausea   . Acute stress reaction        Plan:     Discussed symptoms and concerns.  She has had some recent stressors, hasn't been as well hydrated and not eating consistently the past week.   BPs mostly are low normal to low.  We will cut back Amlodipine to 2.5mg  daily.  She will cut her current tablets in half.   She will continue to monitor BPs, will call report in 1-2 weeks.   She can use medication below for symptoms.   If not improving over the course of the next few days, consider PT referral for vestibular rehab as she has had vertigo prior.  Shari Lawrence was seen today for dizzy.  Diagnoses and all orders for this visit:  Hypotension, unspecified hypotension type  Vertigo  Nausea  Acute stress reaction  Other orders -     meclizine (ANTIVERT) 25 MG tablet; Take 1 tablet (25 mg total)  by mouth 2 (two) times  daily. -     ondansetron (ZOFRAN) 4 MG tablet; Take 1 tablet (4 mg total) by mouth every 8 (eight) hours as needed for nausea or vomiting. -     amLODipine (NORVASC) 5 MG tablet; Take 0.5 tablets (2.5 mg total) by mouth daily.

## 2018-07-20 ENCOUNTER — Telehealth: Payer: Self-pay | Admitting: Medical

## 2018-07-20 ENCOUNTER — Emergency Department (HOSPITAL_COMMUNITY)
Admission: EM | Admit: 2018-07-20 | Discharge: 2018-07-20 | Disposition: A | Discharge status: Home / Self Care | Payer: BC Managed Care – PPO | Attending: Emergency Medicine | Admitting: Emergency Medicine

## 2018-07-20 ENCOUNTER — Other Ambulatory Visit: Payer: Self-pay

## 2018-07-20 ENCOUNTER — Telehealth: Payer: Self-pay | Admitting: Rheumatology

## 2018-07-20 DIAGNOSIS — I10 Essential (primary) hypertension: Secondary | ICD-10-CM | POA: Insufficient documentation

## 2018-07-20 DIAGNOSIS — Z91040 Latex allergy status: Secondary | ICD-10-CM | POA: Diagnosis not present

## 2018-07-20 DIAGNOSIS — R42 Dizziness and giddiness: Secondary | ICD-10-CM | POA: Insufficient documentation

## 2018-07-20 DIAGNOSIS — Z79899 Other long term (current) drug therapy: Secondary | ICD-10-CM | POA: Diagnosis not present

## 2018-07-20 LAB — CBC WITH DIFFERENTIAL/PLATELET
Abs Immature Granulocytes: 0.02 10*3/uL (ref 0.00–0.07)
Basophils Absolute: 0.1 10*3/uL (ref 0.0–0.1)
Basophils Relative: 1 %
Eosinophils Absolute: 0.5 10*3/uL (ref 0.0–0.5)
Eosinophils Relative: 8 %
HCT: 37.9 % (ref 36.0–46.0)
Hemoglobin: 12.2 g/dL (ref 12.0–15.0)
Immature Granulocytes: 0 %
Lymphocytes Relative: 24 %
Lymphs Abs: 1.4 10*3/uL (ref 0.7–4.0)
MCH: 29.5 pg (ref 26.0–34.0)
MCHC: 32.2 g/dL (ref 30.0–36.0)
MCV: 91.8 fL (ref 80.0–100.0)
Monocytes Absolute: 0.4 10*3/uL (ref 0.1–1.0)
Monocytes Relative: 6 %
Neutro Abs: 3.6 10*3/uL (ref 1.7–7.7)
Neutrophils Relative %: 61 %
Platelets: 184 10*3/uL (ref 150–400)
RBC: 4.13 MIL/uL (ref 3.87–5.11)
RDW: 13.9 % (ref 11.5–15.5)
WBC: 6 10*3/uL (ref 4.0–10.5)
nRBC: 0 % (ref 0.0–0.2)

## 2018-07-20 LAB — BASIC METABOLIC PANEL
Anion gap: 10 (ref 5–15)
BUN: 7 mg/dL (ref 6–20)
CO2: 24 mmol/L (ref 22–32)
Calcium: 9.7 mg/dL (ref 8.9–10.3)
Chloride: 106 mmol/L (ref 98–111)
Creatinine, Ser: 0.92 mg/dL (ref 0.44–1.00)
GFR calc Af Amer: >60 mL/min (ref >60)
GFR calc non Af Amer: >60 mL/min (ref >60)
Glucose, Bld: 94 mg/dL (ref 70–99)
Potassium: 4.2 mmol/L (ref 3.5–5.1)
Sodium: 140 mmol/L (ref 135–145)

## 2018-07-20 LAB — URINALYSIS, ROUTINE W REFLEX MICROSCOPIC
Bilirubin Urine: NEGATIVE
Glucose, UA: NEGATIVE mg/dL
Hgb urine dipstick: NEGATIVE
Ketones, ur: NEGATIVE mg/dL
Leukocytes, UA: NEGATIVE
Nitrite: NEGATIVE
Protein, ur: NEGATIVE mg/dL
Specific Gravity, Urine: 1.005 (ref 1.005–1.030)
pH: 8 (ref 5.0–8.0)

## 2018-07-20 LAB — TROPONIN I: Troponin I: <0.03 ng/mL (ref <0.03)

## 2018-07-20 MED ORDER — SODIUM CHLORIDE 0.9 % IV BOLUS
1000.0000 mL | Freq: Once | INTRAVENOUS | Status: AC
  Administered 2018-07-20: 1000 mL via INTRAVENOUS

## 2018-07-20 NOTE — Telephone Encounter (Signed)
Patient states she is having fatigue, muscle soreness in her chest, and dizziness/light headedness. Patient states her blood pressure has been running low. Patient states her lowest blood pressure reading is 97/60. Patient states she has spoke with her PCP and has been advised to cut her BP medication in half and to take it at the same time everyday. Patient states this has not help. Patient has a history of vertigo about 2 years ago. Patient denies any shortness of breath. Patient states she is also having back pain but is not sure if this is associated with what she has going on now.  Patient states she is on PLQ and is taking it as prescribed.

## 2018-07-20 NOTE — Telephone Encounter (Signed)
Pt called and stated that she is no longer going to take the amlodipine. She states that he BP continues to be too low and she ended up in the ER. She states that bp is in the 99/60 range. Pt can be reached at [phone].

## 2018-07-20 NOTE — Telephone Encounter (Signed)
Patient advised to contact PCP or go to emergency  room for evaluation.

## 2018-07-20 NOTE — ED Triage Notes (Signed)
Pt reports that she has been feeling dizzy since Sunday. Pt reports that she has been checking her blood pressure at home and it has been "low." Pt complains of a headache that radiates to the back of her neck. Pt reports she had vertigo two years ago,.

## 2018-07-20 NOTE — Telephone Encounter (Signed)
Needs to see her PCP.  

## 2018-07-20 NOTE — ED Notes (Signed)
Pt given discharge instructions and follow up instructions. Pt verbalized understanding.

## 2018-07-20 NOTE — Telephone Encounter (Signed)
Patient was transferred to [facility] to "discuss health issues."

## 2018-07-20 NOTE — Telephone Encounter (Signed)
I understand.  Yes I agree, stop the Amlodipine, monitor BPs for now, hydrate well, and keep meal intake consistent.

## 2018-07-20 NOTE — ED Provider Notes (Signed)
[facility] EMERGENCY DEPARTMENT Provider Note   CSN: [id number] Arrival date & time: 07/20/18  0849     History   Chief Complaint Chief Complaint  Patient presents with  . Dizziness    HPI Shari Lawrence is a 58 y.o. female.  HPI   58 year old female with dizziness.  She states that symptoms began about a week ago.  Persistent since then.  She states that she just does not feel like her normal self.  At times she feels like she is wearing a cap or hat on her head but is not really having headaches.  She does not feel congested.  Sometimes she feels like her head is loose on her neck.  She has a past history of vertigo but says that this does not feel like a room spinning sensation.  She says that her blood pressure has been running low at home.  She provides a range of 51-700 systolic and 17C to 94W diastolic.  She states that she tends to feel worse when she is sitting or laying back.  She feels better when standing or walking.  She has seen her PCP this week for the symptoms.  They cut back on her blood pressure medication but she does not feel significantly different.  She states that she called her "autoimmune doctor" and was advised to come to the emergency room.  Past Medical History:  Diagnosis Date  . Abnormal Pap smear 1988   treated with cryo  . Anemia 11/2010   hematology consult prior; etiology malabsorption and uterine bleeding  . Anxiety   . Gastric polyp   . GERD (gastroesophageal reflux disease)   . H/O bone density study 12/2007  . H/O hysterectomy for benign disease 5/14  . History of echocardiogram 03/2010   normal LV function, EF 60-65%, mild left atrial enlargement  . HTN (hypertension) 03/2010   hospitalization for HTN urgency  . Hyperlipemia   . Internal hemorrhoids   . Lumbar degenerative disc disease   . Migraine   . Myalgia   . Spondylosis, cervical   . Umbilical hernia   . Uterine fibroid    hx/o     Patient Active Problem  List   Diagnosis Date Noted  . Hypotension 07/17/2018  . Vertigo 07/17/2018  . Nausea 07/17/2018  . Vaccine counseling 05/21/2018  . Need for influenza vaccination 05/21/2018  . Routine general medical examination at a health care facility 05/21/2018  . Caregiver burden 07/05/2017  . Cyst of skin 07/05/2017  . Transient elevated blood pressure 06/28/2017  . Leg pain, diffuse, left 06/28/2017  . Leg swelling 06/28/2017  . Bruising 06/28/2017  . Myofascial muscle pain 06/28/2017  . High risk medication use 03/30/2017  . Chronic constipation 11/17/2016  . Globus sensation 11/17/2016  . Primary osteoarthritis of both knees 10/05/2016  . Autoimmune disease ([place]) 09/29/2016  . Primary osteoarthritis of both hands 09/29/2016  . Family history of lupus erythematosus 09/20/2016  . Polyarthralgia 05/06/2016  . Clenching of teeth 05/06/2016  . Finger swelling 05/06/2016  . Morning stiffness of joints 05/06/2016  . Myalgia 05/06/2016  . Family history of rheumatoid arthritis 05/06/2016  . Essential hypertension 12/23/2014  . Hyperlipidemia 12/23/2014  . History of anemia 12/23/2014  . Care provider for parents 12/23/2014  . Impaired fasting glucose 10/25/2011  . Gluten intolerance 10/25/2011  . Gastroesophageal reflux disease without esophagitis 10/25/2011  . Anxiety state   . Iron deficiency anemia     Past Surgical  History:  Procedure Laterality Date  . CERVICAL FUSION  2010  . CERVIX LESION DESTRUCTION  1988  . CESAREAN SECTION    . CHOLECYSTECTOMY  2003  . COLONOSCOPY  08/2010   Dr. Collene Mares  . COLONOSCOPY  02/2017   Dr. Collene Mares  . ESOPHAGOGASTRODUODENOSCOPY  08/2010   Dr. Collene Mares  . EXPLORATORY LAPAROTOMY    . HERNIA REPAIR    . MYOMECTOMY  1998  . PELVIC LAPAROSCOPY    . ROBOTIC ASSISTED LAPAROSCOPIC HYSTERECTOMY AND SALPINGECTOMY  10/2012   [facility]; laproscopic due to fibroids  . UMBILICAL HERNIA REPAIR  10/2013   infected laparoscopic port, mesh placement     OB History     Gravida  1   Para  1   Term  1   Preterm  0   AB  0   Living  1     SAB  0   TAB  0   Ectopic  0   Multiple  0   Live Births  1            Home Medications    Prior to Admission medications   Medication Sig Start Date End Date Taking? Authorizing Provider  acetaminophen (TYLENOL) 500 MG tablet Take 1 tablet (500 mg total) by mouth every 6 (six) hours as needed. Patient taking differently: Take 500 mg by mouth every 6 (six) hours as needed for mild pain.  04/01/17  Yes Waynetta Pean, PA-C  amLODipine (NORVASC) 5 MG tablet Take 0.5 tablets (2.5 mg total) by mouth daily. 07/17/18  Yes Tysinger, [name], PA-C  Ascorbic Acid (VITAMIN C) 1000 MG tablet Take 1,000 mg by mouth daily.    Yes [provider]  atorvastatin (LIPITOR) 20 MG tablet Take 1 tablet (20 mg total) by mouth daily. 05/23/18  Yes Tysinger, [name], PA-C  B Complex Vitamins (B COMPLEX PO) Take 1 tablet by mouth daily.    Yes [provider]  calcium carbonate (OS-CAL) 600 MG TABS Take 600 mg by mouth daily.    Yes [provider]  diclofenac sodium (VOLTAREN) 1 % GEL Apply 4 g topically 4 (four) times daily. Patient taking differently: Apply 4 g topically 4 (four) times daily as needed (pain).  06/26/17  Yes Joy, Shawn C, PA-C  estradiol (VIVELLE-DOT) 0.0375 MG/24HR Place 1 patch onto the skin 2 (two) times a week. 06/04/18  Yes [name], MD  fish oil-omega-3 fatty acids 1000 MG capsule Take 2 g by mouth daily.   Yes [provider]  Ginger, Zingiber officinalis, (GINGER ROOT PO) Take 1 tablet by mouth daily.    Yes [provider]  hydroxychloroquine (PLAQUENIL) 200 MG tablet TAKE 1 TABLET BY MOUTH TWICE DAILY [facility] Patient taking differently: Take 200 mg by mouth every Monday, Tuesday, Wednesday, Thursday, and Friday.  05/28/18  Yes Deveshwar, Abel Presto, MD  meclizine (ANTIVERT) 25 MG tablet Take 1 tablet (25 mg total) by mouth 2 (two)  times daily. 07/16/18  Yes Tysinger, [name], PA-C  Misc Natural Products (TART CHERRY ADVANCED PO) Take 1 tablet by mouth daily.    Yes [provider]  Multiple Vitamin (MULTIVITAMIN) tablet Take 1 tablet by mouth daily.    Yes [provider]  NEXIUM 40 MG capsule Take 40 mg by mouth daily.  03/13/15  Yes [provider]  ondansetron (ZOFRAN) 4 MG tablet Take 1 tablet (4 mg total) by mouth every 8 (eight) hours as needed for nausea or  vomiting. 07/16/18  Yes Tysinger, [name], PA-C  Probiotic Product (PROBIOTIC ADVANCED PO) Take 1 tablet by mouth daily.    Yes [provider]  TURMERIC PO Take 1 tablet by mouth daily.    Yes [provider]    Family History Family History  Problem Relation Age of Onset  . Hypertension Mother   . Dementia Mother   . Colon cancer Mother 69  . Deep vein thrombosis Father   . Hypertension Father   . Cancer Father        prostate CA  . Heart disease Father 63       CAD  . Parkinsonism Father   . Hypertension Sister   . Hyperlipidemia Sister   . Hypertension Brother   . Hyperlipidemia Brother   . Hypertension Maternal Aunt   . Hyperlipidemia Maternal Aunt   . Rheum arthritis Maternal Aunt   . Diabetes Maternal Aunt        1 with type II, 1 with type 1  . Hypertension Brother   . Rheum arthritis Brother   . Diabetes Paternal Aunt        type II  . Breast cancer Neg Hx     Social History Social History   Tobacco Use  . Smoking status: Never Smoker  . Smokeless tobacco: Never Used  Substance Use Topics  . Alcohol use: No  . Drug use: No     Allergies   Quinolones; Latex; Levaquin [levofloxacin]; Penicillins; Percocet [oxycodone-acetaminophen]; Sulfa drugs cross reactors; Vicodin [hydrocodone-acetaminophen]; and Gluten meal   Review of Systems Review of Systems  All systems reviewed and negative, other than as noted in HPI.  Physical Exam Updated Vital Signs BP (!) 141/84 (BP Location:  Right Arm)   Pulse 64   Temp 98.6 F (37 C) (Oral)   Resp 16   Ht 5\' 4"  (1.626 m)   Wt 55.9 kg   LMP 10/25/2012 (Approximate)   SpO2 100%   BMI 21.15 kg/m   Physical Exam Vitals signs and nursing note reviewed.  Constitutional:      General: She is not in acute distress.    Appearance: She is well-developed.  HENT:     Head: Normocephalic and atraumatic.  Eyes:     General:        Right eye: No discharge.        Left eye: No discharge.     Conjunctiva/sclera: Conjunctivae normal.  Neck:     Musculoskeletal: Neck supple.  Cardiovascular:     Rate and Rhythm: Normal rate and regular rhythm.     Heart sounds: Normal heart sounds. No murmur. No friction rub. No gallop.   Pulmonary:     Effort: Pulmonary effort is normal. No respiratory distress.     Breath sounds: Normal breath sounds.  Abdominal:     General: There is no distension.     Palpations: Abdomen is soft.     Tenderness: There is no abdominal tenderness.  Musculoskeletal:        General: No tenderness.  Skin:    General: Skin is warm and dry.  Neurological:     General: No focal deficit present.     Mental Status: She is alert and oriented to person, place, and time.     Cranial Nerves: No cranial nerve deficit.     Sensory: No sensory deficit.     Motor: No weakness.     Coordination: Coordination normal.  Psychiatric:  Behavior: Behavior normal.        Thought Content: Thought content normal.      ED Treatments / Results  Labs (all labs ordered are listed, but only abnormal results are displayed) Labs Reviewed  URINALYSIS, ROUTINE W REFLEX MICROSCOPIC - Abnormal; Notable for the following components:      Result Value   Color, Urine STRAW (*)    All other components within normal limits  CBC WITH DIFFERENTIAL/PLATELET  BASIC METABOLIC PANEL  TROPONIN I    EKG EKG Interpretation  Date/Time:  Friday July 20 2018 09:12:03 EST Ventricular Rate:  57 PR Interval:    QRS  Duration: 105 QT Interval:  400 QTC Calculation: 390 R Axis:   52 Text Interpretation:  Sinus rhythm Borderline T wave abnormalities No significant change since last tracing Confirmed by Virgel Manifold [phone]) on 07/20/2018 9:33:07 AM   Radiology No results found.  Procedures Procedures (including critical care time)  Medications Ordered in ED Medications  sodium chloride 0.9 % bolus 1,000 mL (1,000 mLs Intravenous New Bag/Given 07/20/18 1020)     Initial Impression / Assessment and Plan / ED Course  I have reviewed the triage vital signs and the nursing notes.  Pertinent labs & imaging results that were available during my care of the patient were reviewed by me and considered in my medical decision making (see chart for details).    58 year old female with this vague sense of dizziness.  It is hard for me to get a great sense of her symptoms.  She says she feels dizzy and not like her normal self.  She feels like she is wearing a cap on her head and feels like her head is loose on her neck.  She says her blood pressure has been low.  The number she provides are low normal and not truly hypotension..  She says that she actually feels better when she is standing or walking.  Her PCP has already cut back on her blood pressure medications.  Her neuro exam is nonfocal.  Her EKG is not significantly changed from prior.  Basic labs in the emergency room are unremarkable.  I'm not sure of the exact etiology of her symptoms, but have a low suspicion for emergent process.   Final Clinical Impressions(s) / ED Diagnoses   Final diagnoses:  Dizziness    ED Discharge Orders    None       Virgel Manifold, MD 07/20/18 1041

## 2018-07-23 NOTE — Telephone Encounter (Signed)
Left message on voicemail for patient to call back. 

## 2018-07-23 NOTE — Telephone Encounter (Signed)
Patient informed of provider message. Patient stated she has been keeping up with bp readings. After stopping the meds her bp was 141/90. She is having a hard time sleeping because when she is lying down she has discomfort in her chest and back. She also stated chest is sore to the touch in certain spots. Will schedule patient for a follow up

## 2018-07-24 ENCOUNTER — Ambulatory Visit: Payer: BC Managed Care – PPO | Admitting: Medical

## 2018-07-24 ENCOUNTER — Encounter: Payer: Self-pay | Admitting: Medical

## 2018-07-24 VITALS — BP 130/86 | HR 59 | Temp 97.8°F | Ht 64.0 in | Wt 123.0 lb

## 2018-07-24 DIAGNOSIS — Z636 Dependent relative needing care at home: Secondary | ICD-10-CM

## 2018-07-24 DIAGNOSIS — M359 Systemic involvement of connective tissue, unspecified: Secondary | ICD-10-CM

## 2018-07-24 DIAGNOSIS — M7918 Myalgia, other site: Secondary | ICD-10-CM

## 2018-07-24 DIAGNOSIS — I10 Essential (primary) hypertension: Secondary | ICD-10-CM

## 2018-07-24 DIAGNOSIS — R079 Chest pain, unspecified: Secondary | ICD-10-CM | POA: Insufficient documentation

## 2018-07-24 DIAGNOSIS — F430 Acute stress reaction: Secondary | ICD-10-CM

## 2018-07-24 MED ORDER — ALPRAZOLAM 0.5 MG PO TABS: 0.5000 mg | ORAL_TABLET | Freq: Every evening | ORAL | 0 refills | Status: DC | PRN

## 2018-07-24 MED ORDER — LOSARTAN POTASSIUM 25 MG PO TABS: ORAL_TABLET | ORAL | 3 refills | Status: DC

## 2018-07-24 NOTE — Progress Notes (Signed)
Subjective: Chief Complaint  Patient presents with  . Hypertension   Here for ED f/u.   Went to the emergency dept 07/20/2018 for dizziness, not feeling well.  She stopped amlodipine since hospital visit but then took another dose of amlodipine last night since pressure was back up, high.    She wants to change to different BP medication since she on and off hasn't tolerated amlodipine.    She doesn't feel comfortable watching and waiting and BPs the last few days are all high and she can "feel" high blood pressure in her neck.      She does exercise regularly at the gym on the treadmill without chest pain, DOE, edema, SOB.    Recent BPs averaging in the 146/91, 136/92 range.    Prior BP medication includes diuretic, but stopped due to reaction to sulfa  She does report intermittent knotty pain in central chest and upper back.   Worse lying down.   She has hx/o autoimmune disease, myalgia, sees Dr. Estanislado Pandy for this.  Past Medical History:  Diagnosis Date  . Abnormal Pap smear 1988   treated with cryo  . Anemia 11/2010   hematology consult prior; etiology malabsorption and uterine bleeding  . Anxiety   . Gastric polyp   . GERD (gastroesophageal reflux disease)   . H/O bone density study 12/2007  . H/O hysterectomy for benign disease 5/14  . History of echocardiogram 03/2010   normal LV function, EF 60-65%, mild left atrial enlargement  . HTN (hypertension) 03/2010   hospitalization for HTN urgency  . Hyperlipemia   . Internal hemorrhoids   . Lumbar degenerative disc disease   . Migraine   . Myalgia   . Spondylosis, cervical   . Umbilical hernia   . Uterine fibroid    hx/o    Current Outpatient Medications on File Prior to Visit  Medication Sig Dispense Refill  . acetaminophen (TYLENOL) 500 MG tablet Take 1 tablet (500 mg total) by mouth every 6 (six) hours as needed. (Patient taking differently: Take 500 mg by mouth every 6 (six) hours as needed for mild pain. ) 30  tablet 0  . amLODipine (NORVASC) 5 MG tablet Take 0.5 tablets (2.5 mg total) by mouth daily. 90 tablet 3  . Ascorbic Acid (VITAMIN C) 1000 MG tablet Take 1,000 mg by mouth daily.     Marland Kitchen atorvastatin (LIPITOR) 20 MG tablet Take 1 tablet (20 mg total) by mouth daily. 90 tablet 3  . B Complex Vitamins (B COMPLEX PO) Take 1 tablet by mouth daily.     . calcium carbonate (OS-CAL) 600 MG TABS Take 600 mg by mouth daily.     . diclofenac sodium (VOLTAREN) 1 % GEL Apply 4 g topically 4 (four) times daily. 100 g 0  . estradiol (VIVELLE-DOT) 0.0375 MG/24HR Place 1 patch onto the skin 2 (two) times a week. 8 patch 1  . fish oil-omega-3 fatty acids 1000 MG capsule Take 2 g by mouth daily.    . Ginger, Zingiber officinalis, (GINGER ROOT PO) Take 1 tablet by mouth daily.     . hydroxychloroquine (PLAQUENIL) 200 MG tablet TAKE 1 TABLET BY MOUTH TWICE DAILY MONDAY THROUGH FRIDAY (Patient taking differently: Take 200 mg by mouth every Monday, Tuesday, Wednesday, Thursday, and Friday. ) 120 tablet 0  . meclizine (ANTIVERT) 25 MG tablet Take 1 tablet (25 mg total) by mouth 2 (two) times daily. 30 tablet 1  . Misc Natural Products (TART CHERRY ADVANCED PO) Take  1 tablet by mouth daily.     . Multiple Vitamin (MULTIVITAMIN) tablet Take 1 tablet by mouth daily.     Marland Kitchen NEXIUM 40 MG capsule Take 40 mg by mouth daily.     . ondansetron (ZOFRAN) 4 MG tablet Take 1 tablet (4 mg total) by mouth every 8 (eight) hours as needed for nausea or vomiting. 20 tablet 0  . Probiotic Product (PROBIOTIC ADVANCED PO) Take 1 tablet by mouth daily.     . TURMERIC PO Take 1 tablet by mouth daily.      No current facility-administered medications on file prior to visit.    ROS as in subjective    Objective: BP 130/86   Pulse (!) 59   Temp 97.8 F (36.6 C) (Oral)   Ht 5\' 4"  (1.626 m)   Wt 123 lb (55.8 kg)   LMP 10/25/2012 (Approximate)   SpO2 99%   BMI 21.11 kg/m     General appearance: alert, no distress, WD/WN,  HEENT:  normocephalic, sclerae anicteric, PERRLA, EOMi, nares patent, no discharge or erythema, pharynx normal Oral cavity: MMM, no lesions Neck: supple, no lymphadenopathy, no thyromegaly, no masses Heart: RRR, normal S1, S2, no murmurs Lungs: CTA bilaterally, no wheezes, rhonchi, or rales She has some mild sternal tenderness, otherwise chest and back nontender Abdomen: +bs, soft, non tender, non distended, no masses, no hepatomegaly, no splenomegaly Extremities: no edema, no cyanosis, no clubbing Pulses: 2+ symmetric, upper and lower extremities, normal cap refill Neurological: alert, oriented x 3, CN2-12 intact, strength normal upper extremities and lower extremities, sensation normal throughout, DTRs 2+ throughout, no cerebellar signs, gait normal Psychiatric: normal affect, behavior normal, pleasant    Assessment: Encounter Diagnoses  Name Primary?  . Essential hypertension Yes  . Autoimmune disease ([place])   . Caregiver burden   . Myofascial muscle pain   . Acute stress reaction   . Chest pain, unspecified type      Plan: Discussed her recent ED visit notes, symptoms, concerns  HTN - stop amlodipine given her recent symptoms and intolerance.  Changes to Losartan as below.  discussed risks/benefits of medication.   F/u in 2-3 weeks  Chest pain - likely myofascial pain.   Reviewed the PET stress test she had done 12/2016.   Advised she go for chest xray  Myofascial pain, autoimmune disease, chest pain - advised she f/u with Dr. Estanislado Pandy as some of her pains could be related to her autoimmune disease rather than GERD, cardiac source or other.  Stress reaction - can use short term Xanax prn.  discussed risks/benefits and proper use of medication, limited short term use.  Blaine was seen today for hypertension.  Diagnoses and all orders for this visit:  Essential hypertension -     DG Chest 2 View; Future  Autoimmune disease ([place])  Caregiver burden  Myofascial muscle  pain  Acute stress reaction  Chest pain, unspecified type -     DG Chest 2 View; Future  Other orders -     ALPRAZolam (XANAX) 0.5 MG tablet; Take 1 tablet (0.5 mg total) by mouth at bedtime as needed for anxiety. -     losartan (COZAAR) 25 MG tablet; 1/2 tablet daily  f/u 2-3 weeks

## 2018-07-27 ENCOUNTER — Telehealth: Payer: Self-pay | Admitting: Medical

## 2018-07-27 NOTE — Telephone Encounter (Signed)
Pt states after changing BP med,  Her BP is still trending upward, last time she checked it was 150/108, then took the other half and it hasn't changed it, gave it too hours and no change, no stress, doesn't know what's going on.  Has been high all day

## 2018-07-27 NOTE — Telephone Encounter (Signed)
It takes weeks for BP to stabilize on a new medication.  Have her go ahead and take the whole tablet, 25mg  Losartan daily for now.   Monitor BPs maybe 3 times per week.  Lets have her call in with readings in a week

## 2018-07-27 NOTE — Telephone Encounter (Signed)
Patient notified

## 2018-07-30 ENCOUNTER — Ambulatory Visit
Admission: RE | Admit: 2018-07-30 | Discharge: 2018-07-30 | Disposition: A | Discharge status: Home / Self Care | Payer: BC Managed Care – PPO | Source: Ambulatory Visit | Attending: Medical | Admitting: Medical

## 2018-07-30 DIAGNOSIS — I10 Essential (primary) hypertension: Secondary | ICD-10-CM

## 2018-07-30 DIAGNOSIS — R079 Chest pain, unspecified: Secondary | ICD-10-CM

## 2018-08-05 ENCOUNTER — Other Ambulatory Visit: Payer: Self-pay | Admitting: Obstetrics and Gynecology

## 2018-08-06 ENCOUNTER — Telehealth: Payer: Self-pay | Admitting: *Deleted

## 2018-08-06 NOTE — Telephone Encounter (Signed)
Call to [facility]. Spoke with Santiago Glad. Santiago Glad states the original prescription was sent in for vivelle- dot 0.0375. States 0.025mg  was dispensed to patient the first time. Santiago Glad states on 07-12-2018, patient was correctly dispensed the 0.0375mg  patches. Santiago Glad states to refuse prescription sent to our office and send in new prescription.

## 2018-08-06 NOTE — Telephone Encounter (Signed)
Medication refill request: Vilvelle- Dot Last AEX:  06-04-18 JJ  Next AEX: 06-06-2019 Last MMG (if hormonal medication request): 06-11-18 density C/BIRADS 1 negative  Refill authorized: Please advise.   Patient returned call. Patient states the pharmacy dispensed 0.025mg  and she thought she was suppose to be on 0.0375. Patient states, "I'm okay on it, willing to keep using this dose." States she can still have some hot flashes, not sure if she needs to give some time to adjust. Patient open to use whatever Dr. Talbert Nan recommends. RN advised would send request to Dr. Talbert Nan and return call if any additional recommendations. Patient agreeable. [facility] [address].

## 2018-08-06 NOTE — Telephone Encounter (Signed)
Call to patient. See refill request dated 08-05-2018. RN updated that per [facility] Glad at [facility], patient had been dispensed 0.0375mg  vivelle-dot patches on 07-12-2018. Patient states she thinks she was dispensed the 0.025mg . Patient states she would like to check when she gets home and return call to update as to what dosage she has been taking. Patient to call back.

## 2018-08-06 NOTE — Telephone Encounter (Signed)
I did prescribe the 0.0375 mg patch, the pharmacy dispensed the wrong medication. Please call the pharmacy and have them give her the correct medication (she should have to pay for it). She should let us know how she is doing on the 0.0375 mg dose and if she wants to continue on that. The original order was for 8 patches with one refill .

## 2018-08-06 NOTE — Telephone Encounter (Signed)
Message left to return call to [facility] at [phone].   Need patient to advise how she is doing on lower dosage of Vivelle-dot patches.

## 2018-08-07 MED ORDER — ESTRADIOL 0.025 MG/24HR TD PTTW: 1.0000 | MEDICATED_PATCH | TRANSDERMAL | 9 refills | Status: DC

## 2018-08-07 NOTE — Telephone Encounter (Signed)
Patient is returning a call to Emily. °

## 2018-08-07 NOTE — Telephone Encounter (Signed)
Spoke with patient. She states that the pharmacy did in fact give her 0.025mg  of the Vivelle-dot patches and that this has worked well for her. Per patient, she would like a prescription of the 0.025mg  [facility] [address].

## 2018-08-07 NOTE — Telephone Encounter (Signed)
Can you refill it until her annual.

## 2018-08-07 NOTE — Addendum Note (Signed)
Addended by: Gwendlyn Deutscher on: 08/07/2018 05:23 PM   Modules accepted: Orders

## 2018-08-07 NOTE — Telephone Encounter (Signed)
Estradiol 0.025 mg patch place 1 place twice a week #[id number] sent to pharmacy on file. Encounter closed.

## 2018-08-15 ENCOUNTER — Telehealth: Payer: Self-pay | Admitting: Obstetrics and Gynecology

## 2018-08-15 DIAGNOSIS — R140 Abdominal distension (gaseous): Secondary | ICD-10-CM

## 2018-08-15 NOTE — Telephone Encounter (Signed)
Sure, please schedule.

## 2018-08-15 NOTE — Telephone Encounter (Signed)
Spoke with patient.  Having feelings of "fullness" in abdominal area.  Saw GI and started on probiotics. (Dr. Collene Mares) Recommended follow up in gyn if not improved.  Pt requests ultrasound to check her ovaries.   Dr. Talbert Nan, okay to order ultrasound and consult with you after?

## 2018-08-15 NOTE — Telephone Encounter (Signed)
Message left to return call to Shari Lawrence at [phone].    

## 2018-08-15 NOTE — Telephone Encounter (Signed)
Patient is having "a lot of pressure in her abdomin and would to get this checked out".

## 2018-08-16 NOTE — Telephone Encounter (Signed)
Return call to patient.  Scheduled ultrasound for patient choice of date/time. She declines earlier appointments offered due to her schedule.  She will call back if she would like to have ultrasound done at outpatient imaging and folllow up with Dr. Talbert Nan after.  Encounter closed.

## 2018-08-20 NOTE — Progress Notes (Signed)
GYNECOLOGY  VISIT   HPI: 58 y.o.   Divorced Black or [place] American Not Hispanic or Latino  female   [id number] with Patient's last menstrual period was 10/25/2012 (approximate).   here for consult following PUS. H/O hysterectomy in 2014 for fibroids.  She has seen Dr Collene Mares for abdominal fullness and was placed on probiotics, she recommended a GYN ultrasound.  The patient c/o bloating and pain with eating, gets gas. Dr Collene Mares is wondering if Plaquenil is causing her symptoms. Struggling with her BP, either high or low. She is also had vertigo.  Her Aunt just died, first cousin died in 29-Jun-2023, Mom died last year. She has gone to grief counseling, in touch with Hospice.  Dad has been in and out of the hospital.  Doesn't think she is depressed, has times of increased anxiety.  She is having hot flashes on the lower dose ERT. Wants to go back up to the 0.375 mg patch.  GYNECOLOGIC HISTORY: Patient's last menstrual period was 10/25/2012 (approximate). Contraception: Hysterectomy Menopausal hormone therapy: Estradiol patch OB History    Gravida  1   Para  1   Term  1   Preterm  0   AB  0   Living  1     SAB  0   TAB  0   Ectopic  0   Multiple  0   Live Births  1              Patient Active Problem List   Diagnosis Date Noted  . Acute stress reaction 07/24/2018  . Chest pain 07/24/2018  . Hypotension 07/17/2018  . Vertigo 07/17/2018  . Nausea 07/17/2018  . Vaccine counseling 05/21/2018  . Need for influenza vaccination 05/21/2018  . Routine general medical examination at a health care facility 05/21/2018  . Caregiver burden 07/05/2017  . Cyst of skin 07/05/2017  . Transient elevated blood pressure 06/28/2017  . Leg pain, diffuse, left 06/28/2017  . Leg swelling 06/28/2017  . Bruising 06/28/2017  . Myofascial muscle pain 06/28/2017  . High risk medication use 03/30/2017  . Chronic constipation 11/17/2016  . Globus sensation 11/17/2016  . Primary osteoarthritis  of both knees 10/05/2016  . Autoimmune disease ([place]) 09/29/2016  . Primary osteoarthritis of both hands 09/29/2016  . Family history of lupus erythematosus 09/20/2016  . Polyarthralgia 05/06/2016  . Clenching of teeth 05/06/2016  . Finger swelling 05/06/2016  . Morning stiffness of joints 05/06/2016  . Myalgia 05/06/2016  . Family history of rheumatoid arthritis 05/06/2016  . Essential hypertension 12/23/2014  . Hyperlipidemia 12/23/2014  . History of anemia 12/23/2014  . Care provider for parents 12/23/2014  . Impaired fasting glucose 10/25/2011  . Gluten intolerance 10/25/2011  . Gastroesophageal reflux disease without esophagitis 10/25/2011  . Anxiety state   . Iron deficiency anemia     Past Medical History:  Diagnosis Date  . Abnormal Pap smear 1988   treated with cryo  . Anemia 11/2010   hematology consult prior; etiology malabsorption and uterine bleeding  . Anxiety   . Gastric polyp   . GERD (gastroesophageal reflux disease)   . H/O bone density study 12/2007  . H/O hysterectomy for benign disease 5/14  . History of echocardiogram 03/2010   normal LV function, EF 60-65%, mild left atrial enlargement  . HTN (hypertension) 03/2010   hospitalization for HTN urgency  . Hyperlipemia   . Internal hemorrhoids   . Lumbar degenerative disc disease   . Migraine   .  Myalgia   . Spondylosis, cervical   . Umbilical hernia   . Uterine fibroid    hx/o     Past Surgical History:  Procedure Laterality Date  . CERVICAL FUSION  2010  . CERVIX LESION DESTRUCTION  1988  . CESAREAN SECTION    . CHOLECYSTECTOMY  2003  . COLONOSCOPY  08/2010   Dr. Collene Mares  . COLONOSCOPY  02/2017   Dr. Collene Mares  . ESOPHAGOGASTRODUODENOSCOPY  08/2010   Dr. Collene Mares  . EXPLORATORY LAPAROTOMY    . HERNIA REPAIR    . MYOMECTOMY  1998  . PELVIC LAPAROSCOPY    . ROBOTIC ASSISTED LAPAROSCOPIC HYSTERECTOMY AND SALPINGECTOMY  10/2012   [facility]; laproscopic due to fibroids  . UMBILICAL HERNIA REPAIR  10/2013    infected laparoscopic port, mesh placement    Current Outpatient Medications  Medication Sig Dispense Refill  . acetaminophen (TYLENOL) 500 MG tablet Take 1 tablet (500 mg total) by mouth every 6 (six) hours as needed. (Patient taking differently: Take 500 mg by mouth every 6 (six) hours as needed for mild pain. ) 30 tablet 0  . ALPRAZolam (XANAX) 0.5 MG tablet Take 1 tablet (0.5 mg total) by mouth at bedtime as needed for anxiety. 30 tablet 0  . amLODipine (NORVASC) 5 MG tablet Take 0.5 tablets (2.5 mg total) by mouth daily. 90 tablet 3  . Ascorbic Acid (VITAMIN C) 1000 MG tablet Take 1,000 mg by mouth daily.     Marland Kitchen atorvastatin (LIPITOR) 20 MG tablet Take 1 tablet (20 mg total) by mouth daily. 90 tablet 3  . B Complex Vitamins (B COMPLEX PO) Take 1 tablet by mouth daily.     . calcium carbonate (OS-CAL) 600 MG TABS Take 600 mg by mouth daily.     . diclofenac sodium (VOLTAREN) 1 % GEL Apply 4 g topically 4 (four) times daily. 100 g 0  . estradiol (VIVELLE-DOT) 0.025 MG/24HR Place 1 patch onto the skin 2 (two) times a week. 8 patch 9  . fish oil-omega-3 fatty acids 1000 MG capsule Take 2 g by mouth daily.    . Ginger, Zingiber officinalis, (GINGER ROOT PO) Take 1 tablet by mouth daily.     . hydroxychloroquine (PLAQUENIL) 200 MG tablet TAKE 1 TABLET BY MOUTH TWICE DAILY MONDAY THROUGH FRIDAY (Patient taking differently: Take 200 mg by mouth every Monday, Tuesday, Wednesday, Thursday, and Friday. ) 120 tablet 0  . losartan (COZAAR) 25 MG tablet 1/2 tablet daily 30 tablet 3  . meclizine (ANTIVERT) 25 MG tablet Take 1 tablet (25 mg total) by mouth 2 (two) times daily. 30 tablet 1  . Misc Natural Products (TART CHERRY ADVANCED PO) Take 1 tablet by mouth daily.     . Multiple Vitamin (MULTIVITAMIN) tablet Take 1 tablet by mouth daily.     Marland Kitchen NEXIUM 40 MG capsule Take 40 mg by mouth daily.     . ondansetron (ZOFRAN) 4 MG tablet Take 1 tablet (4 mg total) by mouth every 8 (eight) hours as needed for  nausea or vomiting. 20 tablet 0  . Probiotic Product (PROBIOTIC ADVANCED PO) Take 1 tablet by mouth daily.     . TURMERIC PO Take 1 tablet by mouth daily.      No current facility-administered medications for this visit.      ALLERGIES: Quinolones; Latex; Levaquin [levofloxacin]; Penicillins; Percocet [oxycodone-acetaminophen]; Sulfa drugs cross reactors; Vicodin [hydrocodone-acetaminophen]; and Gluten meal  Family History  Problem Relation Age of Onset  . Hypertension Mother   .  Dementia Mother   . Colon cancer Mother 77  . Deep vein thrombosis Father   . Hypertension Father   . Cancer Father        prostate CA  . Heart disease Father 57       CAD  . Parkinsonism Father   . Hypertension Sister   . Hyperlipidemia Sister   . Hypertension Brother   . Hyperlipidemia Brother   . Hypertension Maternal Aunt   . Hyperlipidemia Maternal Aunt   . Rheum arthritis Maternal Aunt   . Diabetes Maternal Aunt        1 with type II, 1 with type 1  . Hypertension Brother   . Rheum arthritis Brother   . Diabetes Paternal Aunt        type II  . Breast cancer Neg Hx     Social History   Socioeconomic History  . Marital status: Divorced    Spouse name: Not on file  . Number of children: 1  . Years of education: Not on file  . Highest education level: Not on file  Occupational History    Employer: [employer]    Comment: molecular Production designer, theatre/television/film, PhD candidate, [employer]  Social Needs  . Financial resource strain: Not on file  . Food insecurity:    Worry: Not on file    Inability: Not on file  . Transportation needs:    Medical: Not on file    Non-medical: Not on file  Tobacco Use  . Smoking status: Never Smoker  . Smokeless tobacco: Never Used  Substance and Sexual Activity  . Alcohol use: No  . Drug use: No  . Sexual activity: Not Currently    Partners: Male    Birth control/protection: Surgical  Lifestyle  . Physical activity:    Days per week: Not on file     Minutes per session: Not on file  . Stress: Not on file  Relationships  . Social connections:    Talks on phone: Not on file    Gets together: Not on file    Attends religious service: Not on file    Active member of club or organization: Not on file    Attends meetings of clubs or organizations: Not on file    Relationship status: Not on file  . Intimate partner violence:    Fear of current or ex partner: Not on file    Emotionally abused: Not on file    Physically abused: Not on file    Forced sexual activity: Not on file  Other Topics Concern  . 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  Has 8 yo daughter.  Her siblings and her don't agree on helping take care of their parents, causes family tension.  As of 11/2014    Review of Systems  Constitutional: Negative.   HENT: Negative.   Eyes: Negative.   Respiratory: Negative.   Cardiovascular: Negative.   Gastrointestinal: Negative.   Genitourinary: Negative.   Musculoskeletal: Negative.   Skin: Negative.   Neurological: Negative.   Endo/Heme/Allergies: Negative.   Psychiatric/Behavioral: Negative.     PHYSICAL EXAMINATION:    BP 140/90 (BP Location: Right Arm, Patient Position: Sitting, Cuff Size: Normal)   Pulse 60   Wt 122 lb (55.3  kg)   LMP 10/25/2012 (Approximate)   BMI 20.94 kg/m     General appearance: alert, cooperative and appears stated age  Ultrasound images reviewed with the patient, normal ovaries, absent uterus. No abnormalities noted  ASSESSMENT Abdominal bloating, pain, gas.  ERT, having vasomotor symptoms on the lower dose    PLAN Normal ultrasound She will f/u with Dr Collene Mares Increase ERT to 0.375 mg   An After Visit Summary was printed and given to the  patient.

## 2018-08-21 ENCOUNTER — Ambulatory Visit (INDEPENDENT_AMBULATORY_CARE_PROVIDER_SITE_OTHER): Payer: BC Managed Care – PPO | Admitting: Obstetrics and Gynecology

## 2018-08-21 ENCOUNTER — Telehealth: Payer: Self-pay | Admitting: Rheumatology

## 2018-08-21 ENCOUNTER — Ambulatory Visit (INDEPENDENT_AMBULATORY_CARE_PROVIDER_SITE_OTHER): Payer: BC Managed Care – PPO

## 2018-08-21 ENCOUNTER — Other Ambulatory Visit: Payer: Self-pay

## 2018-08-21 ENCOUNTER — Encounter: Payer: Self-pay | Admitting: Obstetrics and Gynecology

## 2018-08-21 VITALS — BP 140/90 | HR 60 | Wt 122.0 lb

## 2018-08-21 DIAGNOSIS — R140 Abdominal distension (gaseous): Secondary | ICD-10-CM

## 2018-08-21 DIAGNOSIS — Z7189 Other specified counseling: Secondary | ICD-10-CM | POA: Diagnosis not present

## 2018-08-21 DIAGNOSIS — R1084 Generalized abdominal pain: Secondary | ICD-10-CM | POA: Diagnosis not present

## 2018-08-21 DIAGNOSIS — R232 Flushing: Secondary | ICD-10-CM | POA: Diagnosis not present

## 2018-08-21 MED ORDER — ESTRADIOL 0.0375 MG/24HR TD PTTW: 1.0000 | MEDICATED_PATCH | TRANSDERMAL | 3 refills | Status: DC

## 2018-08-21 NOTE — Telephone Encounter (Signed)
Patient states she has been having trouble with her blood pressure dropping low and then also having issues with hypertension. Patient states this is a sudden change. Patient states she has been working with her PCP and changing her blood pressure medication. Patient states she is on PLQ and has been on the medication for a 1 1/2 years. Patient states she is having abdominal pain, bloating which gets worse with eating and states she feels better when she does not eat. Patient states she is working with her GI. Patient states she is having a constant pain in her back and is taking extra strength Tylenol for that once daily. Patient would like to know if the PLQ would cause any of the issues that she is experiencing. Please advise.

## 2018-08-21 NOTE — Telephone Encounter (Signed)
Attempted to contact the patient and left message for patient to call the office.  

## 2018-08-21 NOTE — Telephone Encounter (Signed)
Patient advised the blood pressure symptoms are not related to Plaquenil.  Although the GI symptoms can be related to Plaquenil.  She should see her PCP for fluctuation in her blood pressure symptoms.  If she wants to hold off Plaquenil to see that if GI symptoms resolved she may hold on for a couple of weeks. Patient verbalized understanding.

## 2018-08-21 NOTE — Telephone Encounter (Signed)
I left a message on answering machine for patient to call back.  The blood pressure symptoms are not related to Plaquenil.  Although the GI symptoms can be related to Plaquenil.  She should see her PCP for fluctuation in her blood pressure symptoms.  If she wants to hold off Plaquenil to see that if GI symptoms resolved she may hold on for a couple of weeks.

## 2018-08-21 NOTE — Telephone Encounter (Signed)
See previous phone note.  

## 2018-08-21 NOTE — Telephone Encounter (Signed)
Patient calling to let you know she is still having BP issues, up and down, severe discomfort when eating (abdomen pain), back pain increased now. Patient states this is a sudden change, and questions taking less dosage of Plaquenil, or stopping it. Patient has not taken it today due to the way she is feeling. Please call to discuss.

## 2018-08-21 NOTE — Telephone Encounter (Signed)
Patient left voicemail stating she was returning your call.   

## 2018-08-24 ENCOUNTER — Other Ambulatory Visit: Payer: Self-pay | Admitting: Medical

## 2018-08-24 MED ORDER — ALPRAZOLAM 0.5 MG PO TABS: 0.5000 mg | ORAL_TABLET | Freq: Every evening | ORAL | 0 refills | Status: DC | PRN

## 2018-08-28 ENCOUNTER — Other Ambulatory Visit: Payer: Self-pay

## 2018-08-28 ENCOUNTER — Other Ambulatory Visit: Payer: Self-pay | Admitting: Obstetrics and Gynecology

## 2018-08-31 ENCOUNTER — Other Ambulatory Visit: Payer: Self-pay | Admitting: Medical

## 2018-08-31 MED ORDER — LOSARTAN POTASSIUM 25 MG PO TABS: 25.0000 mg | ORAL_TABLET | Freq: Every day | ORAL | 3 refills | Status: DC

## 2018-09-05 NOTE — Progress Notes (Signed)
Office Visit Note  Patient: Shari Lawrence             Date of Birth: 05-10-1961           MRN: [id number]             PCP: Carlena Hurl, PA-C Referring: Carlena Hurl, PA-C Visit Date: 09/06/2018 Occupation: @GUAROCC @  Subjective:  Increased joint pain.   History of Present Illness: Shari Lawrence is a 58 y.o. female history of autoimmune disease and myofascial pain syndrome.  She states that he was having abdominal discomfort and bloating.  She was seen by Dr. Collene Mares and came off Plaquenil for 2 weeks.  She states her GI symptoms are better but she has been having increased joint pain.  She has been having increasing stiffness in the morning which she describes in her hands.  She also has been experiencing pain in her rib cage.  She describes pain in the back of her skull.  She denies any joint swelling.  She still has discomfort in the trochanteric bursa.    Activities of Daily Living:  Patient reports morning stiffness for 2 hours.   Patient Reports nocturnal pain.  Difficulty dressing/grooming: Denies Difficulty climbing stairs: Reports Difficulty getting out of chair: Reports Difficulty using hands for taps, buttons, cutlery, and/or writing: Reports  Review of Systems  Constitutional: Positive for fatigue. Negative for night sweats, weight gain and weight loss.  HENT: Negative for mouth sores, trouble swallowing, trouble swallowing, mouth dryness and nose dryness.   Eyes: Negative for pain, redness, itching, visual disturbance and dryness.  Respiratory: Negative for cough, shortness of breath, wheezing and difficulty breathing.   Cardiovascular: Negative for chest pain, palpitations, hypertension, irregular heartbeat and swelling in legs/feet.  Gastrointestinal: Positive for abdominal pain, constipation and nausea. Negative for blood in stool and diarrhea.  Endocrine: Negative for increased urination.  Genitourinary: Negative for painful urination, pelvic pain and  vaginal dryness.  Musculoskeletal: Positive for arthralgias, joint pain and morning stiffness. Negative for joint swelling, myalgias, muscle weakness, muscle tenderness and myalgias.  Skin: Negative for color change, rash, hair loss, nodules/bumps, redness, skin tightness, ulcers and sensitivity to sunlight.  Allergic/Immunologic: Negative for susceptible to infections.  Neurological: Negative for dizziness, light-headedness, numbness, headaches, memory loss, night sweats and weakness.  Hematological: Negative for swollen glands.  Psychiatric/Behavioral: Positive for sleep disturbance. Negative for depressed mood and confusion. The patient is not nervous/anxious.     PMFS History:  Patient Active Problem List   Diagnosis Date Noted   Acute stress reaction 07/24/2018   Chest pain 07/24/2018   Hypotension 07/17/2018   Vertigo 07/17/2018   Nausea 07/17/2018   Vaccine counseling 05/21/2018   Need for influenza vaccination 05/21/2018   Routine general medical examination at a health care facility 05/21/2018   Caregiver burden 07/05/2017   Cyst of skin 07/05/2017   Transient elevated blood pressure 06/28/2017   Leg pain, diffuse, left 06/28/2017   Leg swelling 06/28/2017   Bruising 06/28/2017   Myofascial muscle pain 06/28/2017   High risk medication use 03/30/2017   Chronic constipation 11/17/2016   Globus sensation 11/17/2016   Primary osteoarthritis of both knees 10/05/2016   Autoimmune disease ([place]) 09/29/2016   Primary osteoarthritis of both hands 09/29/2016   Family history of lupus erythematosus 09/20/2016   Polyarthralgia 05/06/2016   Clenching of teeth 05/06/2016   Finger swelling 05/06/2016   Morning stiffness of joints 05/06/2016   Myalgia 05/06/2016   Family history of  rheumatoid arthritis 05/06/2016   Essential hypertension 12/23/2014   Hyperlipidemia 12/23/2014   History of anemia 12/23/2014   Care provider for parents 12/23/2014    Impaired fasting glucose 10/25/2011   Gluten intolerance 10/25/2011   Gastroesophageal reflux disease without esophagitis 10/25/2011   Anxiety state    Iron deficiency anemia     Past Medical History:  Diagnosis Date   Abnormal Pap smear 1988   treated with cryo   Anemia 11/2010   hematology consult prior; etiology malabsorption and uterine bleeding   Anxiety    Gastric polyp    GERD (gastroesophageal reflux disease)    H/O bone density study 12/2007   H/O hysterectomy for benign disease 5/14   History of echocardiogram 03/2010   normal LV function, EF 60-65%, mild left atrial enlargement   HTN (hypertension) 03/2010   hospitalization for HTN urgency   Hyperlipemia    Internal hemorrhoids    Lumbar degenerative disc disease    Migraine    Myalgia    Spondylosis, cervical    Umbilical hernia    Uterine fibroid    hx/o     Family History  Problem Relation Age of Onset   Hypertension Mother    Dementia Mother    Colon cancer Mother 2   Deep vein thrombosis Father    Hypertension Father    Cancer Father        prostate CA   Heart disease Father 71       CAD   Parkinsonism Father    Hypertension Sister    Hyperlipidemia Sister    Hypertension Brother    Hyperlipidemia Brother    Hypertension Maternal Aunt    Hyperlipidemia Maternal Aunt    Rheum arthritis Maternal Aunt    Diabetes Maternal Aunt        1 with type II, 1 with type 1   Hypertension Brother    Rheum arthritis Brother    Diabetes Paternal Aunt        type II   Breast cancer Neg Hx    Past Surgical History:  Procedure Laterality Date   CERVICAL FUSION  2010   CERVIX LESION DESTRUCTION  1988   CESAREAN SECTION     CHOLECYSTECTOMY  2003   COLONOSCOPY  08/2010   Dr. Collene Mares   COLONOSCOPY  02/2017   Dr. Collene Mares   ESOPHAGOGASTRODUODENOSCOPY  08/2010   Dr. 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  Has 92 yo daughter.  Her siblings and her don't agree on helping take care of their parents, causes family tension.  As of 11/2014   Immunization History  Administered Date(s) Administered   Influenza,inj,Quad PF,6+ Mos 05/01/2013, 03/31/2014, 05/17/2017, 05/21/2018   Td 02/04/2012     Objective: Vital Signs: BP 136/88 (BP Location: Left Arm, Patient Position: Sitting, Cuff Size: Normal)    Pulse (!) 49    Resp 13    Ht 5\' 4"  (1.626 m)  Wt 127 lb (57.6 kg)    LMP 10/25/2012 (Approximate)    BMI 21.80 kg/m    Physical Exam Vitals signs and nursing note reviewed.  Constitutional:      Appearance: She is well-developed.  HENT:     Head: Normocephalic and atraumatic.  Eyes:     Conjunctiva/sclera: Conjunctivae normal.  Neck:     Musculoskeletal: Normal range of motion.  Cardiovascular:     Rate and Rhythm: Normal rate and regular rhythm.     Heart sounds: Normal heart sounds.  Pulmonary:     Effort: Pulmonary effort is normal.     Breath sounds: Normal breath sounds.  Abdominal:     General: Bowel sounds are normal.     Palpations: Abdomen is soft.  Lymphadenopathy:     Cervical: No cervical adenopathy.  Skin:    General: Skin is warm and dry.     Capillary Refill: Capillary refill takes less than 2 seconds.  Neurological:     Mental Status: She is alert and oriented to person, place, and time.  Psychiatric:        Behavior: Behavior normal.      Musculoskeletal Exam: C-spine thoracic lumbar spine good range  of motion.  Shoulder joints elbow joints wrist joint MCPs PIPs DIPs with good range of motion with no synovitis.  She has spasm in bilateral trapezius area.  She also has some discomfort in the rib cage area.  No point tenderness was noted.  Hip joints, knee joints, ankles MTPs PIPs were in good range of motion.  Shoulder joints elbow joints wrist joint MCPs PIPs been good range of motion with no synovitis.  CDAI Exam: CDAI Score: Not documented Patient Global Assessment: Not documented; Provider Global Assessment: Not documented Swollen: Not documented; Tender: Not documented Joint Exam   Not documented   There is currently no information documented on the homunculus. Go to the Rheumatology activity and complete the homunculus joint exam.  Investigation: No additional findings.  Imaging: US Pelvis Transvanginal Non-ob (tv Only)  Result Date: 08/21/2018 SEE PROGRESS NOTES FOR RESULTS    Recent Labs: Lab Results  Component Value Date   WBC 6.0 07/20/2018   HGB 12.2 07/20/2018   PLT 184 07/20/2018   NA 140 07/20/2018   K 4.2 07/20/2018   CL 106 07/20/2018   CO2 24 07/20/2018   GLUCOSE 94 07/20/2018   BUN 7 07/20/2018   CREATININE 0.92 07/20/2018   BILITOT 0.4 05/21/2018   ALKPHOS 55 05/21/2018   AST 18 05/21/2018   ALT 22 05/21/2018   PROT 6.6 05/21/2018   ALBUMIN 4.5 05/21/2018   CALCIUM 9.7 07/20/2018   GFRAA >60 07/20/2018    Speciality Comments: PLQ Eye Exam: 03/09/18 WNL @ [facility]   Procedures:  No procedures performed Allergies: Quinolones; Latex; Levaquin [levofloxacin]; Penicillins; Percocet [oxycodone-acetaminophen]; Sulfa drugs cross reactors; Vicodin [hydrocodone-acetaminophen]; and Gluten meal   Assessment / Plan:     Visit Diagnoses: Autoimmune disease ([place]) - ANA 1:320 NO, ds8, RF-,CCP-,History of nasal ulcers and oral ulcers in the past, positive synovitis in left hand MCPs on [place] previously.  She came off Plaquenil due to GI side effects.  She  has been experiencing increased joint pain off Plaquenil.  Have advised her to resume Plaquenil only once a day and see if it resolves her symptoms.  High risk medication use -  PLQ twice daily Monday through Friday.  Her eye exam has been normal per patient.  Her labs have  been stable.  Myofascial muscle pain-she has been under a lot of stress and having a flare with increased muscle pain.  Stretching exercises and relaxation techniques were discussed.  Trapezius muscle spasm-she has bilateral trapezius spasm.  Relaxation of the trapezius muscles were explained.  Trochanteric bursitis of both hips-I have encouraged her to do trochanteric bursa exercises.  Primary osteoarthritis of both hands-she has been experiencing increased hand pain but I did not see any synovitis on exam today.  Primary osteoarthritis of both knees-she has been experiencing increased knee joint pain.  Per her request a prescription refill for Voltaren gel was given and side effects were reviewed.  Other medical problems are listed as follows:  Family history of rheumatoid arthritis  Family history of lupus erythematosus  History of anemia  History of hyperlipidemia  History of hypertension  History of anxiety  History of gastroesophageal reflux (GERD)   Orders: No orders of the defined types were placed in this encounter.  No orders of the defined types were placed in this encounter.   .  Follow-Up Instructions: Return in about 5 months (around 02/06/2019) for Autoimmune disease, Osteoarthritis,MFPS.   [name], MD  Note - This record has been created using Editor, commissioning.  Chart creation errors have been sought, but may not always  have been located. Such creation errors do not reflect on  the standard of medical care.

## 2018-09-06 ENCOUNTER — Encounter: Payer: Self-pay | Admitting: Rheumatology

## 2018-09-06 ENCOUNTER — Other Ambulatory Visit: Payer: Self-pay

## 2018-09-06 ENCOUNTER — Ambulatory Visit: Payer: BC Managed Care – PPO | Admitting: Rheumatology

## 2018-09-06 VITALS — BP 136/88 | HR 49 | Resp 13 | Ht 64.0 in | Wt 127.0 lb

## 2018-09-06 DIAGNOSIS — M19042 Primary osteoarthritis, left hand: Secondary | ICD-10-CM

## 2018-09-06 DIAGNOSIS — Z79899 Other long term (current) drug therapy: Secondary | ICD-10-CM | POA: Diagnosis not present

## 2018-09-06 DIAGNOSIS — M7062 Trochanteric bursitis, left hip: Secondary | ICD-10-CM

## 2018-09-06 DIAGNOSIS — M62838 Other muscle spasm: Secondary | ICD-10-CM | POA: Diagnosis not present

## 2018-09-06 DIAGNOSIS — M359 Systemic involvement of connective tissue, unspecified: Secondary | ICD-10-CM | POA: Diagnosis not present

## 2018-09-06 DIAGNOSIS — Z8261 Family history of arthritis: Secondary | ICD-10-CM

## 2018-09-06 DIAGNOSIS — M170 Bilateral primary osteoarthritis of knee: Secondary | ICD-10-CM

## 2018-09-06 DIAGNOSIS — Z8679 Personal history of other diseases of the circulatory system: Secondary | ICD-10-CM

## 2018-09-06 DIAGNOSIS — M7918 Myalgia, other site: Secondary | ICD-10-CM | POA: Diagnosis not present

## 2018-09-06 DIAGNOSIS — Z8659 Personal history of other mental and behavioral disorders: Secondary | ICD-10-CM

## 2018-09-06 DIAGNOSIS — M19041 Primary osteoarthritis, right hand: Secondary | ICD-10-CM

## 2018-09-06 DIAGNOSIS — Z8639 Personal history of other endocrine, nutritional and metabolic disease: Secondary | ICD-10-CM

## 2018-09-06 DIAGNOSIS — M7061 Trochanteric bursitis, right hip: Secondary | ICD-10-CM

## 2018-09-06 DIAGNOSIS — Z862 Personal history of diseases of the blood and blood-forming organs and certain disorders involving the immune mechanism: Secondary | ICD-10-CM

## 2018-09-06 DIAGNOSIS — Z8719 Personal history of other diseases of the digestive system: Secondary | ICD-10-CM

## 2018-09-06 DIAGNOSIS — Z840 Family history of diseases of the skin and subcutaneous tissue: Secondary | ICD-10-CM

## 2018-09-06 MED ORDER — DICLOFENAC SODIUM 1 % TD GEL: TRANSDERMAL | 3 refills | Status: DC

## 2018-09-13 ENCOUNTER — Other Ambulatory Visit: Payer: Self-pay | Admitting: Rheumatology

## 2018-09-13 NOTE — Telephone Encounter (Signed)
Last Visit: 09/06/18 Next visit: 01/28/19 Labs: 07/20/18 WNL PLQ Eye Exam: 03/09/18 WNL  Okay to refill per Dr. Estanislado Pandy

## 2018-10-07 ENCOUNTER — Other Ambulatory Visit: Payer: Self-pay | Admitting: Rheumatology

## 2018-10-26 ENCOUNTER — Other Ambulatory Visit: Payer: Self-pay

## 2018-10-26 ENCOUNTER — Ambulatory Visit: Payer: BC Managed Care – PPO | Admitting: Medical

## 2018-10-26 ENCOUNTER — Telehealth: Payer: Self-pay | Admitting: Medical

## 2018-10-26 ENCOUNTER — Encounter: Payer: Self-pay | Admitting: Medical

## 2018-10-26 VITALS — BP 140/95 | HR 59 | Temp 97.4°F | Wt 127.0 lb

## 2018-10-26 DIAGNOSIS — M359 Systemic involvement of connective tissue, unspecified: Secondary | ICD-10-CM | POA: Diagnosis not present

## 2018-10-26 DIAGNOSIS — F411 Generalized anxiety disorder: Secondary | ICD-10-CM | POA: Diagnosis not present

## 2018-10-26 DIAGNOSIS — I10 Essential (primary) hypertension: Secondary | ICD-10-CM

## 2018-10-26 DIAGNOSIS — Z79899 Other long term (current) drug therapy: Secondary | ICD-10-CM

## 2018-10-26 DIAGNOSIS — R079 Chest pain, unspecified: Secondary | ICD-10-CM

## 2018-10-26 MED ORDER — LOSARTAN POTASSIUM 25 MG PO TABS: 25.0000 mg | ORAL_TABLET | Freq: Every day | ORAL | 2 refills | Status: DC

## 2018-10-26 MED ORDER — ALPRAZOLAM 0.5 MG PO TABS: 0.5000 mg | ORAL_TABLET | Freq: Two times a day (BID) | ORAL | 1 refills | Status: DC | PRN

## 2018-10-26 MED ORDER — PAROXETINE HCL 10 MG PO TABS: 10.0000 mg | ORAL_TABLET | Freq: Every day | ORAL | 1 refills | Status: DC

## 2018-10-26 NOTE — Progress Notes (Signed)
Subjective:     Patient ID: Shari Lawrence, female   DOB: May 05, 1961, 58 y.o.   MRN: [id number]  This visit type was conducted due to national recommendations for restrictions regarding the COVID-19 Pandemic (e.g. social distancing) in an effort to limit this patient's exposure and mitigate transmission in our community.  Due to their co-morbid illnesses, this patient is at least at moderate risk for complications without adequate follow up.  This format is felt to be most appropriate for this patient at this time.    Documentation for virtual audio and video telecommunications through Zoom encounter:  The patient was located at home. The provider was located in the office. The patient did consent to this visit and is aware of possible charges through their insurance for this visit.  The other persons participating in this telemedicine service were none. Time spent on call was 22 minutes and in review of previous records >49minutes total.  This virtual service is not related to other E/M service within previous 7 days.   HPI Chief Complaint  Patient presents with  . 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  Hypertension-currently is only taking losartan 25 mg 1/2 tablet daily as her pharmacy continues to refill that dose versus a more recent dose of 25 mg 1 tablet daily of losartan.  Lately her blood pressures have been running in the 270-350 systolic, but does get some normal readings  She is under a lot of stress.  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  Her 57 year old nurse aide is not even washing her hands sometimes when she enters the house.  She is frustrated and worried that her father will catch the coronavirus.   She is still working full-time as a Network engineer at [employer].  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  This is causing her some stress and anxiety.  Even feels winded sometimes talking  She has been on Paxil a long time ago and does not recall how it made her feel.  She did not tolerate Zoloft in the past.  And she takes Xanax it really does help her anxiety and chest pain but she ran out of it.  She is willing to go back on medication like Paxil if it will help  She talked to her rheumatologist recently and they did not feel that her chest pain was related to autoimmune disease  She has no other new complaint  Review of Systems As in subjective    Objective:   Physical Exam Due to coronavirus pandemic stay at home measures, patient visit was virtual and they were not examined in person.   Well-developed, well-nourished, no acute distress, answers questions appropriately     Assessment:     Encounter Diagnoses  Name Primary?  . Essential hypertension Yes  . Anxiety state   . Autoimmune disease ([place])   . Chest pain, unspecified type   . High risk medication use        Plan:     Hypertension- we will call and make  sure the pharmacy stopped refilling a prior prescription for losartan and 1/2 tablet daily.  Change back to 25 mg losartan 1 tablet daily, monitor blood pressures regularly, and plan to give me a report within 3 to 4 weeks on blood pressures and medication tolerance.  She has not tolerated amlodipine in the past.  Her pulse rate is on the low side so beta-blocker may not be an option.  In the past has not done all that well with diuretics either  Anxiety-discussed her concerns, her chest discomfort may be in part related to anxiety.  She is willing to begin trial of Paxil.  Discussed risk and benefits of medication.  She has been on Paxil in the past.  She did not  tolerate Zoloft in the past.  She can still use Xanax as needed.  We discussed her stressors, and I encouraged her to go ahead and take advantage of her 2 weeks of vacation coming up soon to give chance for relaxation  Chest pain- we discussed this last visit.  She notes that her rheumatologist does not think her chest pains are primarily due to her autoimmune disease.  We discussed her prior evaluation including normal stress test in 2018 normal chest CT abdomen chest and pelvis in 2018, normal chest x-ray in February 2020.  She has had this chest pain on and off in the past.  We discussed doing a PFT the next time she is in the office in a month or so, but wants to avoid coming in the office right now due to coronavirus concerns.  She will consider seeing a cardiologist just to rule out other etiologies but again for right now declines any other eval.  Her chest pain is likely more related to anxiety  Autoimmune disease-I reviewed her recent office notes with Dr. Estanislado Pandy  High risk medication use-I reviewed her most recent labs in the chart  Judeen was seen today for bp.  Diagnoses and all orders for this visit:  Essential hypertension  Anxiety state  Autoimmune disease ([place])  Chest pain, unspecified type  High risk medication use  Other orders -     PARoxetine (PAXIL) 10 MG tablet; Take 1 tablet (10 mg total) by mouth daily. -     losartan (COZAAR) 25 MG tablet; Take 1 tablet (25 mg total) by mouth daily. -     ALPRAZolam (XANAX) 0.5 MG tablet; Take 1 tablet (0.5 mg total) by mouth 2 (two) times daily as needed for anxiety.  Follow-up 2 to 3 weeks

## 2018-10-26 NOTE — Telephone Encounter (Signed)
Done and called new rx in with 3 refills.

## 2018-10-26 NOTE — Telephone Encounter (Signed)
Please call pharmacy.  Please make sure they fill Losartan 25mg , 1 tablet daily.   NOT 1/2 tablet daily Losartan, discontinue this.

## 2018-11-16 ENCOUNTER — Other Ambulatory Visit: Payer: Self-pay

## 2018-11-16 ENCOUNTER — Ambulatory Visit: Payer: BC Managed Care – PPO | Admitting: Rheumatology

## 2018-11-16 DIAGNOSIS — M359 Systemic involvement of connective tissue, unspecified: Secondary | ICD-10-CM

## 2018-11-17 ENCOUNTER — Other Ambulatory Visit: Payer: Self-pay | Admitting: Medical

## 2018-11-20 LAB — C3 AND C4
C3 Complement: 109 mg/dL (ref 83–193)
C4 Complement: 21 mg/dL (ref 15–57)

## 2018-11-20 LAB — URINALYSIS, ROUTINE W REFLEX MICROSCOPIC
Bilirubin Urine: NEGATIVE
Glucose, UA: NEGATIVE
Hgb urine dipstick: NEGATIVE
Ketones, ur: NEGATIVE
Leukocytes,Ua: NEGATIVE
Nitrite: NEGATIVE
Protein, ur: NEGATIVE
Specific Gravity, Urine: 1.017 (ref 1.001–1.03)
pH: 6 (ref 5.0–8.0)

## 2018-11-20 LAB — SEDIMENTATION RATE: Sed Rate: 6 mm/h (ref 0–30)

## 2018-11-20 LAB — ANTI-DNA ANTIBODY, DOUBLE-STRANDED: ds DNA Ab: 8 IU/mL — ABNORMAL HIGH

## 2018-11-20 NOTE — Progress Notes (Signed)
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  Please advise patient to notify us if she develops any signs or symptoms of a flare.

## 2018-11-20 NOTE — Telephone Encounter (Signed)
Is this ok to refill?  

## 2018-11-20 NOTE — Progress Notes (Signed)
UA WNL.  Sed rate WNL.  Complements WNL.

## 2018-11-20 NOTE — Progress Notes (Signed)
Please schedule a sooner appointment for further evaluation.

## 2018-11-21 ENCOUNTER — Encounter: Payer: Self-pay | Admitting: Rheumatology

## 2018-11-21 ENCOUNTER — Ambulatory Visit: Payer: BC Managed Care – PPO | Admitting: Rheumatology

## 2018-11-21 ENCOUNTER — Other Ambulatory Visit: Payer: Self-pay

## 2018-11-21 VITALS — BP 158/88 | HR 60 | Resp 13 | Ht 64.0 in | Wt 124.0 lb

## 2018-11-21 DIAGNOSIS — Z79899 Other long term (current) drug therapy: Secondary | ICD-10-CM

## 2018-11-21 DIAGNOSIS — M62838 Other muscle spasm: Secondary | ICD-10-CM

## 2018-11-21 DIAGNOSIS — Z8719 Personal history of other diseases of the digestive system: Secondary | ICD-10-CM

## 2018-11-21 DIAGNOSIS — M7918 Myalgia, other site: Secondary | ICD-10-CM | POA: Diagnosis not present

## 2018-11-21 DIAGNOSIS — M170 Bilateral primary osteoarthritis of knee: Secondary | ICD-10-CM

## 2018-11-21 DIAGNOSIS — Z840 Family history of diseases of the skin and subcutaneous tissue: Secondary | ICD-10-CM

## 2018-11-21 DIAGNOSIS — M359 Systemic involvement of connective tissue, unspecified: Secondary | ICD-10-CM

## 2018-11-21 DIAGNOSIS — M19041 Primary osteoarthritis, right hand: Secondary | ICD-10-CM

## 2018-11-21 DIAGNOSIS — M7061 Trochanteric bursitis, right hip: Secondary | ICD-10-CM

## 2018-11-21 DIAGNOSIS — Z8261 Family history of arthritis: Secondary | ICD-10-CM

## 2018-11-21 DIAGNOSIS — Z862 Personal history of diseases of the blood and blood-forming organs and certain disorders involving the immune mechanism: Secondary | ICD-10-CM

## 2018-11-21 DIAGNOSIS — Z8679 Personal history of other diseases of the circulatory system: Secondary | ICD-10-CM

## 2018-11-21 DIAGNOSIS — Z8659 Personal history of other mental and behavioral disorders: Secondary | ICD-10-CM

## 2018-11-21 DIAGNOSIS — Z8639 Personal history of other endocrine, nutritional and metabolic disease: Secondary | ICD-10-CM

## 2018-11-21 DIAGNOSIS — M7062 Trochanteric bursitis, left hip: Secondary | ICD-10-CM

## 2018-11-21 DIAGNOSIS — M19042 Primary osteoarthritis, left hand: Secondary | ICD-10-CM

## 2018-11-21 NOTE — Progress Notes (Signed)
Office Visit Note  Patient: Shari Lawrence             Date of Birth: Jun 05, 1961           MRN: [id number]             PCP: Carlena Hurl, PA-C Referring: Carlena Hurl, PA-C Visit Date: 11/21/2018 Occupation: @GUAROCC @  Subjective:  Pain in both wrist joints    History of Present Illness: Shari Lawrence is a 58 y.o. female with history of autoimmune disease, myofascial pain, and osteoarthritis.  Patient is on Plaquenil 200 mg 1 tablet po daily M-F. She reduced to 1 tablet po daily Monday through Friday only, which has helped with GI SEs. She is having increased pain in both hands and wrist joints. She is having difficulty opening jars.  She is having increased morning stiffness.  She reports intermittent swelling especially in the morning. She has an oral ulcer on the tongue currently. She reports irritation in her nose. She has chronic sicca symptoms.  she denies any recent rashes or symptoms of Raynaud's. She has chronic trochanteric bursitis.  She has been trying to change positions frequently.    Activities of Daily Living:  Patient reports morning stiffness for 1 hour.   Patient Denies nocturnal pain.  Difficulty dressing/grooming: Denies Difficulty climbing stairs: Denies Difficulty getting out of chair: Denies Difficulty using hands for taps, buttons, cutlery, and/or writing: Reports  Review of Systems  Constitutional: Positive for fatigue.  HENT: Positive for mouth sores, mouth dryness and nose dryness.   Eyes: Positive for dryness. Negative for pain and visual disturbance.  Respiratory: Negative for cough, hemoptysis, shortness of breath and difficulty breathing.   Cardiovascular: Negative for chest pain, palpitations, hypertension and swelling in legs/feet.  Gastrointestinal: Positive for constipation. Negative for blood in stool and diarrhea.  Endocrine: Negative for increased urination.  Genitourinary: Negative for painful urination.  Musculoskeletal: Positive  for arthralgias, joint pain, joint swelling, myalgias, morning stiffness, muscle tenderness and myalgias. Negative for muscle weakness.  Skin: Negative for color change, pallor, rash, hair loss, nodules/bumps, skin tightness, ulcers and sensitivity to sunlight.  Allergic/Immunologic: Negative for susceptible to infections.  Neurological: Negative for dizziness, numbness, headaches and weakness.  Hematological: Negative for swollen glands.  Psychiatric/Behavioral: Positive for depressed mood and sleep disturbance. The patient is nervous/anxious.     PMFS History:  Patient Active Problem List   Diagnosis Date Noted  . Acute stress reaction 07/24/2018  . Chest pain 07/24/2018  . Hypotension 07/17/2018  . Vertigo 07/17/2018  . Nausea 07/17/2018  . Vaccine counseling 05/21/2018  . Need for influenza vaccination 05/21/2018  . Routine general medical examination at a health care facility 05/21/2018  . Caregiver burden 07/05/2017  . Cyst of skin 07/05/2017  . Transient elevated blood pressure 06/28/2017  . Leg pain, diffuse, left 06/28/2017  . Leg swelling 06/28/2017  . Bruising 06/28/2017  . Myofascial muscle pain 06/28/2017  . High risk medication use 03/30/2017  . Chronic constipation 11/17/2016  . Globus sensation 11/17/2016  . Primary osteoarthritis of both knees 10/05/2016  . Autoimmune disease ([place]) 09/29/2016  . Primary osteoarthritis of both hands 09/29/2016  . Family history of lupus erythematosus 09/20/2016  . Polyarthralgia 05/06/2016  . Clenching of teeth 05/06/2016  . Finger swelling 05/06/2016  . Morning stiffness of joints 05/06/2016  . Myalgia 05/06/2016  . Family history of rheumatoid arthritis 05/06/2016  . Essential hypertension 12/23/2014  . Hyperlipidemia 12/23/2014  . History of anemia  12/23/2014  . Care provider for parents 12/23/2014  . Impaired fasting glucose 10/25/2011  . Gluten intolerance 10/25/2011  . Gastroesophageal reflux disease without  esophagitis 10/25/2011  . Anxiety state   . Iron deficiency anemia     Past Medical History:  Diagnosis Date  . Abnormal Pap smear 1988   treated with cryo  . Anemia 11/2010   hematology consult prior; etiology malabsorption and uterine bleeding  . Anxiety   . Gastric polyp   . GERD (gastroesophageal reflux disease)   . H/O bone density study 12/2007  . H/O hysterectomy for benign disease 5/14  . History of echocardiogram 03/2010   normal LV function, EF 60-65%, mild left atrial enlargement  . HTN (hypertension) 03/2010   hospitalization for HTN urgency  . Hyperlipemia   . Internal hemorrhoids   . Lumbar degenerative disc disease   . Migraine   . Myalgia   . Spondylosis, cervical   . Umbilical hernia   . Uterine fibroid    hx/o     Family History  Problem Relation Age of Onset  . Hypertension Mother   . Dementia Mother   . Colon cancer Mother 36  . Deep vein thrombosis Father   . Hypertension Father   . Cancer Father        prostate CA  . Heart disease Father 39       CAD  . Parkinsonism Father   . Hypertension Sister   . Hyperlipidemia Sister   . Hypertension Brother   . Hyperlipidemia Brother   . Hypertension Maternal Aunt   . Hyperlipidemia Maternal Aunt   . Rheum arthritis Maternal Aunt   . Diabetes Maternal Aunt        1 with type II, 1 with type 1  . Hypertension Brother   . Rheum arthritis Brother   . Diabetes Paternal Aunt        type II  . Breast cancer Neg Hx    Past Surgical History:  Procedure Laterality Date  . CERVICAL FUSION  2010  . CERVIX LESION DESTRUCTION  1988  . CESAREAN SECTION    . CHOLECYSTECTOMY  2003  . COLONOSCOPY  08/2010   Dr. Collene Mares  . COLONOSCOPY  02/2017   Dr. Collene Mares  . ESOPHAGOGASTRODUODENOSCOPY  08/2010   Dr. Collene Mares  . EXPLORATORY LAPAROTOMY    . HERNIA REPAIR    . MYOMECTOMY  1998  . PELVIC LAPAROSCOPY    . ROBOTIC ASSISTED LAPAROSCOPIC HYSTERECTOMY AND SALPINGECTOMY  10/2012   [facility]; laproscopic due to fibroids  .  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  Has 47 yo daughter.  Her siblings and her don't agree on helping take care of their parents, causes family tension.  As of 11/2014   Immunization History  Administered Date(s) Administered  . Influenza,inj,Quad PF,6+ Mos 05/01/2013, 03/31/2014, 05/17/2017, 05/21/2018  . Td 02/04/2012     Objective: Vital Signs: BP (!) 158/88 (BP Location: Left Arm, Patient Position: Sitting, Cuff Size: Normal)   Pulse 60   Resp 13   Ht 5\' 4"  (1.626 m)   Wt 124 lb (56.2 kg)   LMP 10/25/2012 (Approximate)   BMI 21.28 kg/m  Physical Exam Vitals signs and nursing note reviewed.  Constitutional:      Appearance: She is well-developed.  HENT:     Head: Normocephalic and atraumatic.  Eyes:     Conjunctiva/sclera: Conjunctivae normal.  Neck:     Musculoskeletal: Normal range of motion.  Cardiovascular:     Rate and Rhythm: Normal rate and regular rhythm.     Heart sounds: Normal heart sounds.  Pulmonary:     Effort: Pulmonary effort is normal.     Breath sounds: Normal breath sounds.  Abdominal:     General: Bowel sounds are normal.     Palpations: Abdomen is soft.  Lymphadenopathy:     Cervical: No cervical adenopathy.  Skin:    General: Skin is warm and dry.     Capillary Refill: Capillary refill takes less than 2 seconds.  Neurological:     Mental Status: She is alert and oriented to person, place, and time.  Psychiatric:        Behavior: Behavior normal.      Musculoskeletal Exam: C-spine, thoracic spine, and lumbar spine good ROM.  No midline spinal tenderness.  No SI joint tenderness.  Shoulder joints, elbow joints, wrist  joints, MCPs, PIPs, and DIPs good ROM with no synovitis.  Hip joints, knee joints, ankle joints, MTPs, PIPs, and DIPs good ROM with no synovitis.  No warmth or effusion of knee joints.  No tenderness or swelling of ankle joints.  Tenderness over trochanteric bursa bilaterally.   CDAI Exam: CDAI Score: Not documented Patient Global Assessment: Not documented; Provider Global Assessment: Not documented Swollen: Not documented; Tender: Not documented Joint Exam   Not documented   There is currently no information documented on the homunculus. Go to the Rheumatology activity and complete the homunculus joint exam.  Investigation: No additional findings.  Imaging: No results found.  Recent Labs: Lab Results  Component Value Date   WBC 6.0 07/20/2018   HGB 12.2 07/20/2018   PLT 184 07/20/2018   NA 140 07/20/2018   K 4.2 07/20/2018   CL 106 07/20/2018   CO2 24 07/20/2018   GLUCOSE 94 07/20/2018   BUN 7 07/20/2018   CREATININE 0.92 07/20/2018   BILITOT 0.4 05/21/2018   ALKPHOS 55 05/21/2018   AST 18 05/21/2018   ALT 22 05/21/2018   PROT 6.6 05/21/2018   ALBUMIN 4.5 05/21/2018   CALCIUM 9.7 07/20/2018   GFRAA >60 07/20/2018    Speciality Comments: PLQ Eye Exam: 03/09/18 WNL @ [facility]   Procedures:  No procedures performed Allergies: Quinolones; Latex; Levaquin [levofloxacin]; Penicillins; Percocet [oxycodone-acetaminophen]; Sulfa drugs cross reactors; Vicodin [hydrocodone-acetaminophen]; Zoloft [sertraline hcl]; and Gluten meal      Assessment / Plan:     Visit Diagnoses: Autoimmune disease ([place]) - ANA 1:320 NO, ds8, RF-,CCP-,History of nasal ulcers and oral ulcers in the past, positive synovitis in left hand MCPs on [place] previously: She has not had any recent signs or symptoms of a flare.  She is clinically doing well on the reduced dose of PLQ. She has been taking PLQ 200 mg 1 tablet by mouth daily Monday through Friday only for the past 1.5 months.  She has no  synovitis on exam.  She has been having increased pain in both wrist joints but has good ROM with no inflammation. She experiences sensitivity but no visible ulcers in her mouth or nose.  She has chronic sicca symptoms.  She has not had any recent rash or symptoms of Raynaud's.  She had lab work drawn on 11/16/18 and Complements and Sed rate were WNL and dsDNA was stable.  She will continue taking PLQ 200 mg 1 tablet po daily M-F.  She does not need a refill at this time.  She will follow up in 5 months.   High risk medication use - PLQ 200 mg 1 tablet by mouth daily M-F only.  CBC and CMP were drawn today.Last Plaquenil eye exam normal on 03/09/2018.  Most recent CBC/BMP within normal limits on 07/20/2018.  Last CMP showed normal LFTs on 05/21/2018.  Due for CBC/CMP today and will monitor every 5 months.  Standing orders placed.  She received her flu vaccine in November.- Plan: CBC with Differential/Platelet, COMPLETE METABOLIC PANEL WITH GFR, CBC with Differential/Platelet, COMPLETE METABOLIC PANEL WITH GFR  Myofascial muscle pain: She has generalized muscle aches and muscle tenderness.  She has been having increased trochanteric bursa tenderness recently.  She has chronic fatigue related to insomnia.  She has been experiencing pain at night, which has causing interrupted sleep at night.  She was encouraged to stay active and exercise on a regular basis.   Trapezius muscle spasm: She has trapezius muscle spasms and muscle tension.   Trochanteric bursitis of both hips: She has tenderness over trochanteric bursa bilaterally.  She was encouraged to perform stretching exercises daily.    Primary osteoarthritis of both hands: She has mild PIP and DIP synovial thickening consistent osteoarthritis.  Complete fist formation bilaterally.  She has no synovitis or tenderness on exam.  Joint protection and muscle strengthening were discussed.   Primary osteoarthritis of both knees: No warmth or effusion of knee  joints.  She has good ROM with no discomfort.    Other medical conditions are listed as follows:   Family history of rheumatoid arthritis  Family history of lupus erythematosus  History of anemia  History of hyperlipidemia  History of hypertension  History of gastroesophageal reflux (GERD)  History of anxiety   Orders: Orders Placed This Encounter  Procedures  . CBC with Differential/Platelet  . COMPLETE METABOLIC PANEL WITH GFR  . CBC with Differential/Platelet  . COMPLETE METABOLIC PANEL WITH GFR   No orders of the defined types were placed in this encounter.   Face-to-face time spent with patient was 30 minutes. Greater than 50% of time was spent in counseling and coordination of care.  Follow-Up Instructions: Return for Autoimmune Disease.   Ofilia Neas, PA-C   I examined and evaluated the patient with Hazel Sams PA.  Patient had no synovitis on the examination.  Most recent labs were discussed with her at length.  She appears to be having a flare of myofascial pain syndrome with some increased discomfort.  She also has underlying osteoarthritis.  The plan of care was discussed as noted above.  [name], MD  Note - This record has been created using Editor, commissioning.  Chart creation errors have been sought, but may not always  have been located. Such creation errors do not reflect on  the standard of medical care.

## 2018-11-22 LAB — COMPLETE METABOLIC PANEL WITH GFR
AG Ratio: 2 (calc) (ref 1.0–2.5)
ALT: 21 U/L (ref 6–29)
AST: 21 U/L (ref 10–35)
Albumin: 4.5 g/dL (ref 3.6–5.1)
Alkaline phosphatase (APISO): 56 U/L (ref 37–153)
BUN: 15 mg/dL (ref 7–25)
CO2: 28 mmol/L (ref 20–32)
Calcium: 10.3 mg/dL (ref 8.6–10.4)
Chloride: 105 mmol/L (ref 98–110)
Creat: 1.02 mg/dL (ref 0.50–1.05)
GFR, Est African American: 71 mL/min/{1.73_m2} (ref >=60)
GFR, Est Non African American: 61 mL/min/{1.73_m2} (ref >=60)
Globulin: 2.3 g/dL (calc) (ref 1.9–3.7)
Glucose, Bld: 82 mg/dL (ref 65–99)
Potassium: 4.1 mmol/L (ref 3.5–5.3)
Sodium: 141 mmol/L (ref 135–146)
Total Bilirubin: 0.4 mg/dL (ref 0.2–1.2)
Total Protein: 6.8 g/dL (ref 6.1–8.1)

## 2018-11-22 LAB — CBC WITH DIFFERENTIAL/PLATELET
Absolute Monocytes: 284 cells/uL (ref 200–950)
Basophils Absolute: 30 cells/uL (ref 0–200)
Basophils Relative: 0.7 %
Eosinophils Absolute: 151 cells/uL (ref 15–500)
Eosinophils Relative: 3.5 %
HCT: 37.1 % (ref 35.0–45.0)
Hemoglobin: 11.9 g/dL (ref 11.7–15.5)
Lymphs Abs: 1797 cells/uL (ref 850–3900)
MCH: 29.2 pg (ref 27.0–33.0)
MCHC: 32.1 g/dL (ref 32.0–36.0)
MCV: 91.2 fL (ref 80.0–100.0)
MPV: 12.7 fL — ABNORMAL HIGH (ref 7.5–12.5)
Monocytes Relative: 6.6 %
Neutro Abs: 2038 cells/uL (ref 1500–7800)
Neutrophils Relative %: 47.4 %
Platelets: 157 10*3/uL (ref 140–400)
RBC: 4.07 10*6/uL (ref 3.80–5.10)
RDW: 12.9 % (ref 11.0–15.0)
Total Lymphocyte: 41.8 %
WBC: 4.3 10*3/uL (ref 3.8–10.8)

## 2018-11-22 NOTE — Progress Notes (Signed)
CBC and CMP WNL

## 2018-12-25 IMAGING — MG DIGITAL SCREENING BILATERAL MAMMOGRAM WITH TOMO AND CAD
6 of 10 series · 6 of 30 positions shown · non-contrast
Comparison: Previous exam(s).

CLINICAL DATA: Screening.

EXAM:
DIGITAL SCREENING BILATERAL MAMMOGRAM WITH TOMO AND CAD

[R CC synth-2D (1 of 2)]
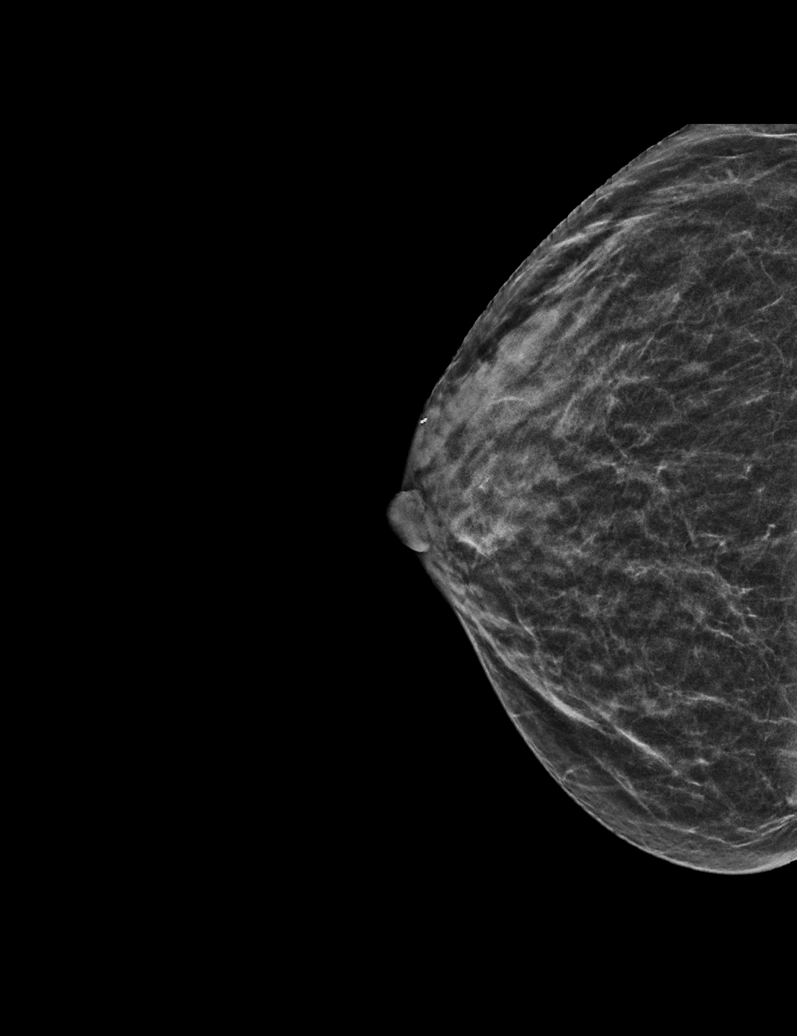

[R MLO synth-2D]
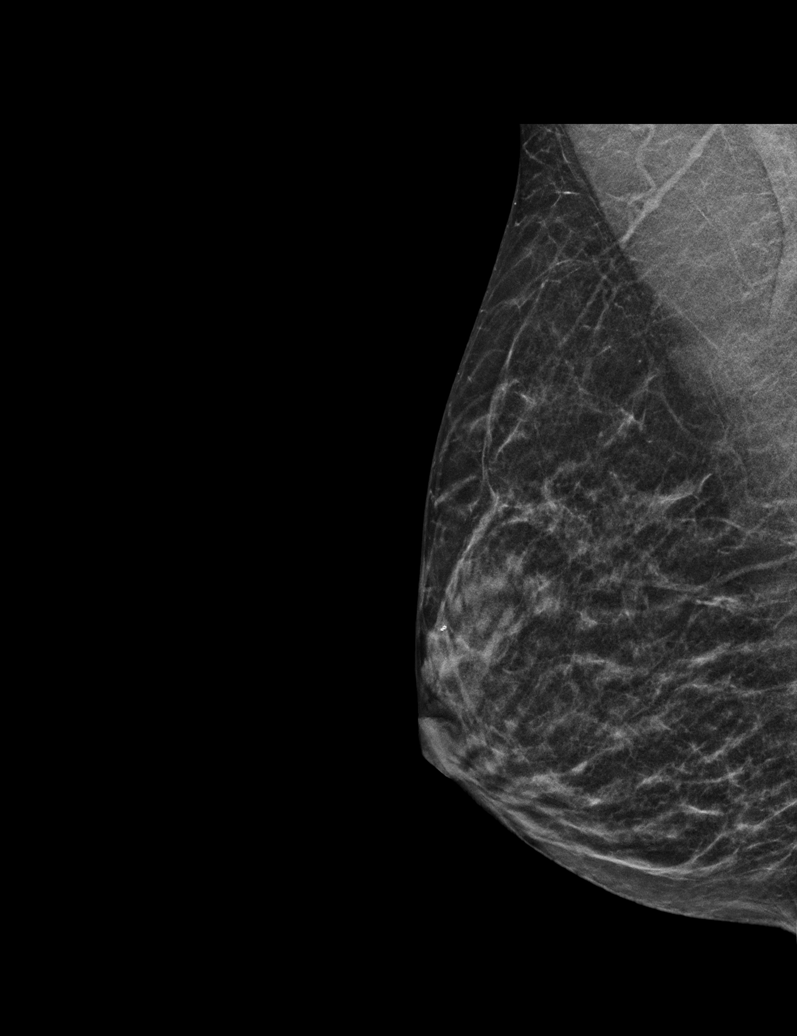

[L CC synth-2D]
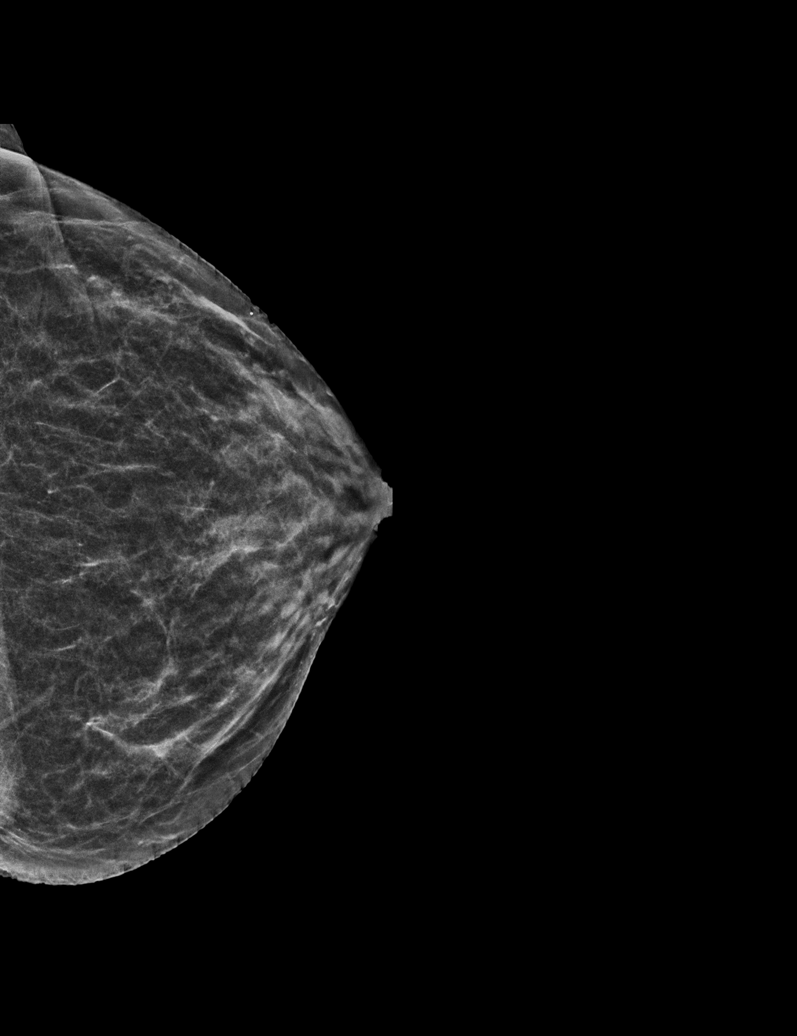

[L MLO synth-2D]
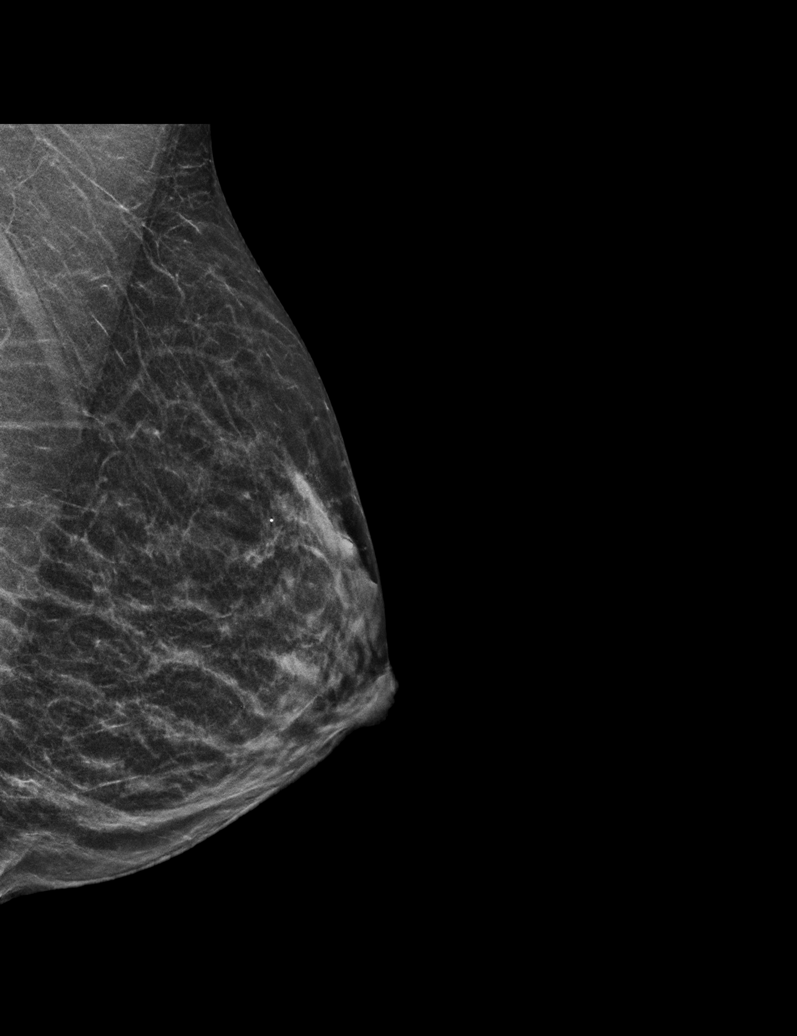

[R CC synth-2D (2 of 2)]
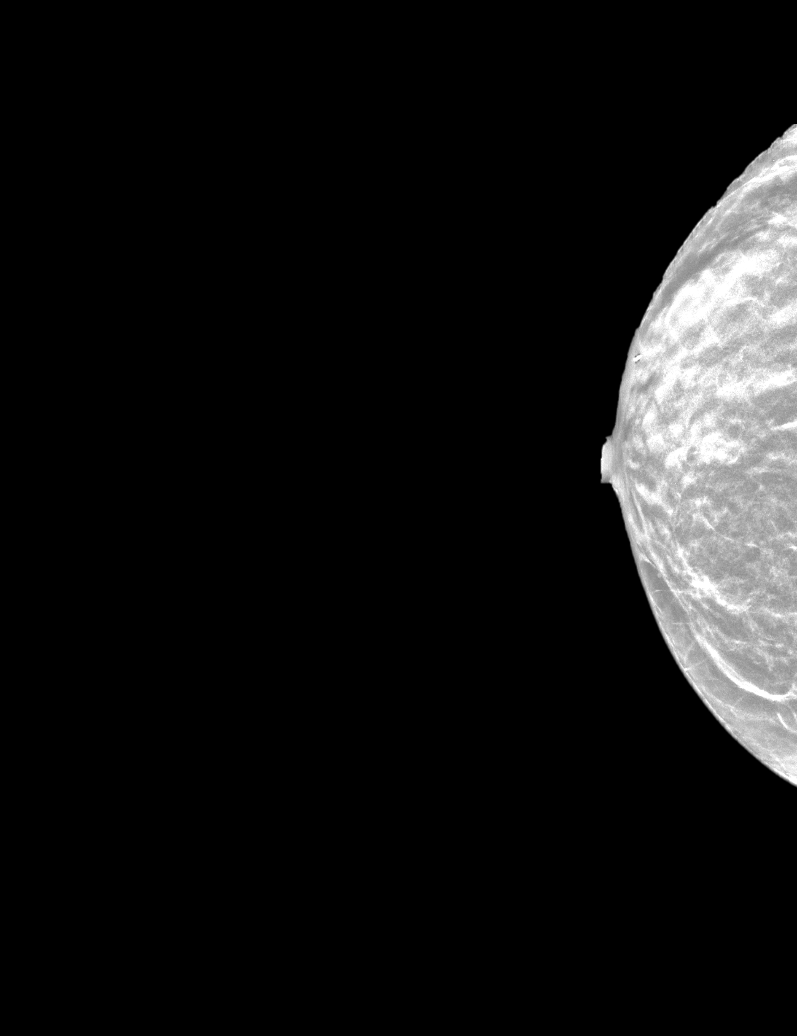

[L MLO tomo · tomo slice 20/39.0]
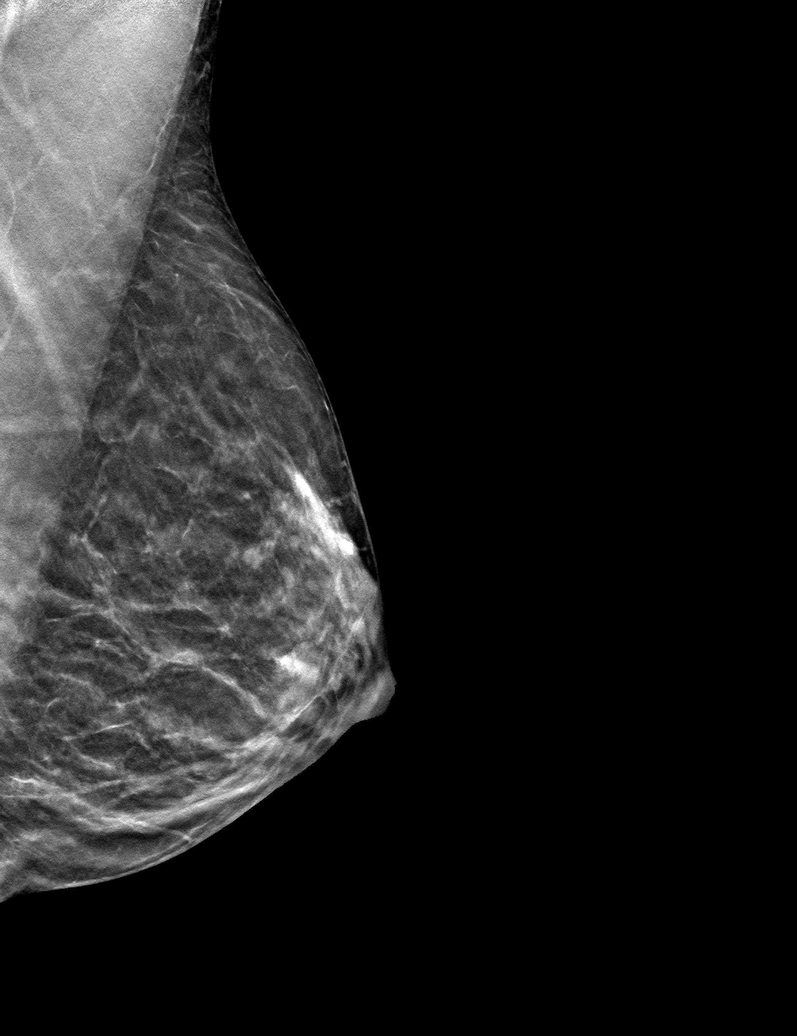

[6 of 30 positions shown; findings below may reference images not displayed]

ACR Breast Density Category c: The breast tissue is heterogeneously
dense, which may obscure small masses.
FINDINGS: There are no findings suspicious for malignancy. Images were
processed with CAD.
IMPRESSION: No mammographic evidence of malignancy. A result letter of this
screening mammogram will be mailed directly to the patient.

RECOMMENDATION:
Screening mammogram in one year. (Code:[id number]-U-LHB)

BI-RADS CATEGORY  1: Negative.

## 2019-01-03 ENCOUNTER — Telehealth: Payer: Self-pay | Admitting: Obstetrics and Gynecology

## 2019-01-03 NOTE — Telephone Encounter (Signed)
Spoke with patient. She is complaining of lower back discomfort and urinary frequency. Patient thinks may have urinary tract infection. Denies fever or hematuria or Covid symptoms. Offered appointment for today but patient declines. Request appointment tomorrow PM. Made appointment 01-04-19 4:00pm with Melvia Heaps, CNM.

## 2019-01-03 NOTE — Telephone Encounter (Signed)
Patient is experiencing frequent urination and lower back pain. Available after 1 pm. No available openings. OK to see any provider.

## 2019-01-04 ENCOUNTER — Other Ambulatory Visit: Payer: Self-pay

## 2019-01-04 ENCOUNTER — Ambulatory Visit: Payer: BC Managed Care – PPO | Admitting: Certified Nurse Midwife

## 2019-01-04 ENCOUNTER — Encounter: Payer: Self-pay | Admitting: Certified Nurse Midwife

## 2019-01-04 ENCOUNTER — Other Ambulatory Visit: Payer: Self-pay | Admitting: Certified Nurse Midwife

## 2019-01-04 VITALS — BP 114/70 | HR 64 | Temp 97.1°F | Resp 16 | Wt 123.0 lb

## 2019-01-04 DIAGNOSIS — R350 Frequency of micturition: Secondary | ICD-10-CM

## 2019-01-04 DIAGNOSIS — N390 Urinary tract infection, site not specified: Secondary | ICD-10-CM

## 2019-01-04 LAB — POCT URINALYSIS DIPSTICK
Bilirubin, UA: NEGATIVE
Blood, UA: NEGATIVE
Glucose, UA: NEGATIVE
Ketones, UA: NEGATIVE
Leukocytes, UA: NEGATIVE
Nitrite, UA: NEGATIVE
Protein, UA: NEGATIVE
Urobilinogen, UA: NEGATIVE E.U./dL — AB
pH, UA: 5 (ref 5.0–8.0)

## 2019-01-04 NOTE — Progress Notes (Signed)
58 y.o. Divorced Caucasian female [id number] here with complaint of  ? UTI, with onset  on for the past 4 days. Patient complaining of urinary frequency/urgency/ and pain with urination. Patient denies fever, chills, nausea. Had back pain which was occurring but has resolved today.. No new personal products. Patient feels not related to sexual activity, not sexually active. Denies any vaginal symptoms.  Menopausal with no vaginal dryness. Patient may not have been consuming adequate water intake. Caring for father and staying busy with him all day.  Review of Systems  Constitutional: Negative.   HENT: Negative.   Eyes: Negative.   Respiratory: Negative.   Cardiovascular: Negative.   Gastrointestinal: Negative.   Genitourinary: Positive for frequency and urgency.       Back pain  Musculoskeletal: Negative.   Skin: Negative.   Neurological: Negative.   Endo/Heme/Allergies: Negative.   Psychiatric/Behavioral: Negative.     O: Healthy female WDWN Affect: Normal, orientation x 3 Skin : warm and dry CVAT: negative bilateral Abdomen: negative for suprapubic tenderness  Pelvic exam: External genital area: normal, no lesions Bladder,Urethra non tender, Urethral meatus: non tender,  No redness Vagina: normal vaginal discharge, normal appearance  Wet prep not taken Cervix: normal, non tender Uterus:normal,non tender Adnexa: normal non tender, no fullness or masses  poct urine-neg  A: R/O UTI Normal pelvic exam  P: Reviewed findings of UTI and need for treatment. JSH:FWYOV culture Reviewed warning signs and symptoms of UTI and need to advise if occurring. Encouraged to limit soda, tea, and coffee and be sure to increase water intake.   RV prn

## 2019-01-06 LAB — URINE CULTURE

## 2019-01-07 ENCOUNTER — Ambulatory Visit: Payer: BC Managed Care – PPO | Admitting: Certified Nurse Midwife

## 2019-01-17 ENCOUNTER — Other Ambulatory Visit: Payer: Self-pay | Admitting: Medical

## 2019-01-23 ENCOUNTER — Other Ambulatory Visit: Payer: Self-pay

## 2019-01-23 ENCOUNTER — Ambulatory Visit: Payer: BC Managed Care – PPO | Admitting: Medical

## 2019-01-23 ENCOUNTER — Encounter: Payer: Self-pay | Admitting: Medical

## 2019-01-23 VITALS — BP 120/70 | HR 78 | Temp 98.4°F | Resp 16 | Wt 124.0 lb

## 2019-01-23 DIAGNOSIS — M791 Myalgia, unspecified site: Secondary | ICD-10-CM

## 2019-01-23 DIAGNOSIS — R109 Unspecified abdominal pain: Secondary | ICD-10-CM | POA: Diagnosis not present

## 2019-01-23 DIAGNOSIS — K5909 Other constipation: Secondary | ICD-10-CM

## 2019-01-23 DIAGNOSIS — M549 Dorsalgia, unspecified: Secondary | ICD-10-CM | POA: Diagnosis not present

## 2019-01-23 DIAGNOSIS — G8929 Other chronic pain: Secondary | ICD-10-CM

## 2019-01-23 DIAGNOSIS — Z636 Dependent relative needing care at home: Secondary | ICD-10-CM

## 2019-01-23 DIAGNOSIS — F411 Generalized anxiety disorder: Secondary | ICD-10-CM

## 2019-01-23 DIAGNOSIS — M359 Systemic involvement of connective tissue, unspecified: Secondary | ICD-10-CM

## 2019-01-23 DIAGNOSIS — Z79899 Other long term (current) drug therapy: Secondary | ICD-10-CM

## 2019-01-23 DIAGNOSIS — I10 Essential (primary) hypertension: Secondary | ICD-10-CM | POA: Diagnosis not present

## 2019-01-23 DIAGNOSIS — M7918 Myalgia, other site: Secondary | ICD-10-CM

## 2019-01-23 MED ORDER — ALPRAZOLAM 0.5 MG PO TABS: 0.5000 mg | ORAL_TABLET | Freq: Two times a day (BID) | ORAL | 1 refills | Status: DC | PRN

## 2019-01-23 NOTE — Progress Notes (Signed)
Subjective: Chief Complaint  Patient presents with  . back pain    bilateral back pain upper back X a while    Here for not feeling well.   She has hx/o autoimmune disease, myofascial pain, arthritis, chronic abdominal pain.  She sees rheumatology and GI. lately not feeling well.   Lately c/o pains in ribs, pain in upper back, empty pains in her stomach.    Thought it was an empty feeling, side effect from Plaquenil.  Talked about this with rheumatology and GI, and they stopped this temporarily but this didn't help.  GI referred to gyn to rule out gyn issue.    Was trying peppermint and probiotic but didn't get a lot of improvement on symptoms.   She does get improvement with constipation with motegrity prescribed by Dr. Collene Mares.    Her big issues current is stress.  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  He requires total care.  She has caregiver there from 11am -5pm daily currently but she handles his care the rest of the time other than some limited time her siblings help.  Her daughter just moved back in yesterday.   She is stressed as family isn't helping much.  She worried about aids coming in that could have Covid, doesn't trust them, but also not comfortable with putting father in nursing home.    Also still trying to work full time.   Her mother passed away last    Past Medical History:  Diagnosis Date  . Abnormal Pap smear 1988   treated with cryo  . Anemia 11/2010   hematology consult prior; etiology malabsorption and uterine bleeding  . Anxiety   . Gastric polyp   . GERD (gastroesophageal reflux disease)   . H/O bone density study 12/2007  . H/O hysterectomy for benign disease 5/14  . History of echocardiogram 03/2010   normal LV function, EF 60-65%, mild left atrial enlargement  . HTN (hypertension) 03/2010   hospitalization for HTN urgency  . Hyperlipemia   . Internal hemorrhoids   . Lumbar degenerative disc disease   . Migraine   . Myalgia   .  Spondylosis, cervical   . Umbilical hernia   . Uterine fibroid    hx/o    Current Outpatient Medications on File Prior to Visit  Medication Sig Dispense Refill  . acetaminophen (TYLENOL) 500 MG tablet Take 1 tablet (500 mg total) by mouth every 6 (six) hours as needed. (Patient taking differently: Take 500 mg by mouth every 6 (six) hours as needed for mild pain. ) 30 tablet 0  . Ascorbic Acid (VITAMIN C) 1000 MG tablet Take 1,000 mg by mouth daily.     Marland Kitchen atorvastatin (LIPITOR) 20 MG tablet Take 1 tablet (20 mg total) by mouth daily. 90 tablet 3  . B Complex Vitamins (B COMPLEX PO) Take 1 tablet by mouth daily.     . calcium carbonate (OS-CAL) 600 MG TABS Take 600 mg by mouth daily.     Marland Kitchen CINNAMON PO Take by mouth.    . diclofenac sodium (VOLTAREN) 1 % GEL 3 grams to 3 large joints up to 3 times daily 3 Tube 3  . estradiol (VIVELLE-DOT) 0.0375 MG/24HR Place 1 patch onto the skin 2 (two) times a week. 24 patch 3  . fish oil-omega-3 fatty acids 1000 MG capsule Take 2 g by mouth daily.    . Ginger, Zingiber officinalis, (GINGER ROOT PO) Take 1 tablet by mouth daily.     Marland Kitchen  hydroxychloroquine (PLAQUENIL) 200 MG tablet TAKE 1 TABLET BY MOUTH TWICE DAILY MONDAY THROUGH FRIDAY (Patient taking differently: Take 1 tablet by mouth daily Monday through Friday) 120 tablet 1  . losartan (COZAAR) 25 MG tablet TAKE 1 TABLET BY MOUTH EVERY DAY 90 tablet 1  . Multiple Vitamin (MULTIVITAMIN) tablet Take 1 tablet by mouth daily.     Marland Kitchen NEXIUM 40 MG capsule Take 40 mg by mouth daily.     . Probiotic Product (PROBIOTIC ADVANCED PO) Take 1 tablet by mouth daily.     . TURMERIC PO Take 1 tablet by mouth daily.      No current facility-administered medications on file prior to visit.    Family History  Problem Relation Age of Onset  . Hypertension Mother   . Dementia Mother   . Colon cancer Mother 57  . Deep vein thrombosis Father   . Hypertension Father   . Cancer Father        prostate CA  . Heart disease  Father 58       CAD  . Parkinsonism Father   . Hypertension Sister   . Hyperlipidemia Sister   . Hypertension Brother   . Hyperlipidemia Brother   . Hypertension Maternal Aunt   . Hyperlipidemia Maternal Aunt   . Rheum arthritis Maternal Aunt   . Diabetes Maternal Aunt        1 with type II, 1 with type 1  . Hypertension Brother   . Rheum arthritis Brother   . Diabetes Paternal Aunt        type II  . Breast cancer Neg Hx      Objective: BP 120/70   Pulse 78   Temp 98.4 F (36.9 C) (Tympanic)   Resp 16   Wt 124 lb (56.2 kg)   LMP 10/25/2012 (Approximate)   SpO2 98%   BMI 21.28 kg/m   Wt Readings from Last 3 Encounters:  01/23/19 124 lb (56.2 kg)  01/04/19 123 lb (55.8 kg)  11/21/18 124 lb (56.2 kg)   General appearance: alert, no distress, WD/WN,  Neck: supple, no lymphadenopathy, no thyromegaly, no masses Heart: RRR, normal S1, S2, no murmurs Lungs: CTA bilaterally, no wheezes, rhonchi, or rales Abdomen: +bs, soft, mild generalized tenderness, otherwise non tender, non distended, no masses, no hepatomegaly, no splenomegaly Pulses: 2+ symmetric, upper and lower extremities, normal cap refill Back nontender, no deformity      Assessment: Encounter Diagnoses  Name Primary?  . Chronic abdominal pain Yes  . Chronic back pain, unspecified back location, unspecified back pain laterality   . Essential hypertension   . Chronic constipation   . Anxiety state   . Autoimmune disease ([place])   . Caregiver burden   . High risk medication use   . Myofascial muscle pain   . Myalgia      Plan: We discussed her concerns and symptoms.  There is not one clear answer for her symptoms.  I gave her the option of updated imaging to rule out certain causes of back abdominal pain.  She will let me know if she wants to pursue CT abdomen and pelvis which would help [place] to look at both abdominal and pelvis organs as well as some imaging of bony structures.  Or we could get a  baseline thoracic and lumbar spine x-ray.  She will let me know about imaging.  She will begin samples of Trulance to help with chronic constipation.  These were originally given by Dr. Hoyt Koch.  Some of her symptoms are likely constipation related.   She has underlying autoimmune disease, myalgias, myofascial pain and arthritis.   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   She is also trying to manage nurse aids and other family in the house helping with her father, but at the same time keeping anyone out of the house who may potentially have Covid illness.    I suspect a lot of her symptoms are physical manifestations of her anxiety, psychosomatic symptoms, moreso than a purely physical cause.  We had discussed using Paxil and Xanax prior.  She declined to use Paxil.  She does have xanax on hand for prn use.   She will try to work on getting some exercise and relaxation techniques.  She is scared to put father in nursing home currently given Covid scare/Covid risks.    She was getting massages, but also scared to do this currently given covid risks.   I reviewed her 10/2018 rheumatology consult notes, 10/2018 labs, 07/2018 chest xray and 07/2018 pelvic ultrasound notes.    F/u with call back in 2 weeks regarding Trulace and whether she wants to pursue imaging.      Analiz was seen today for back pain.  Diagnoses and all orders for this visit:  Chronic abdominal pain  Chronic back pain, unspecified back location, unspecified back pain laterality  Essential hypertension  Chronic constipation  Anxiety state  Autoimmune disease ([place])  Caregiver burden  High risk medication use  Myofascial muscle pain  Myalgia  Other orders -     Discontinue: ALPRAZolam (XANAX) 0.5 MG tablet; Take 1 tablet (0.5 mg total) by mouth 2 (two) times daily as needed for  anxiety. -     ALPRAZolam (XANAX) 0.5 MG tablet; Take 1 tablet (0.5 mg total) by mouth 2 (two) times daily as needed for anxiety.

## 2019-01-25 MED ORDER — ALPRAZOLAM 0.5 MG PO TABS: 0.5000 mg | ORAL_TABLET | Freq: Two times a day (BID) | ORAL | 1 refills | Status: DC | PRN

## 2019-01-28 ENCOUNTER — Ambulatory Visit: Payer: Self-pay | Admitting: Rheumatology

## 2019-02-12 ENCOUNTER — Other Ambulatory Visit: Payer: Self-pay | Admitting: Medical

## 2019-02-12 ENCOUNTER — Telehealth: Payer: Self-pay | Admitting: Medical

## 2019-02-12 DIAGNOSIS — R1084 Generalized abdominal pain: Secondary | ICD-10-CM

## 2019-02-12 IMAGING — CR DG CHEST 2V
2 series · 2 of 2 positions shown · non-contrast
Comparison: 12/30/2013

CLINICAL DATA: Essential hypertension.  Chest pain.

EXAM:
CHEST - 2 VIEW

[w chest pa]
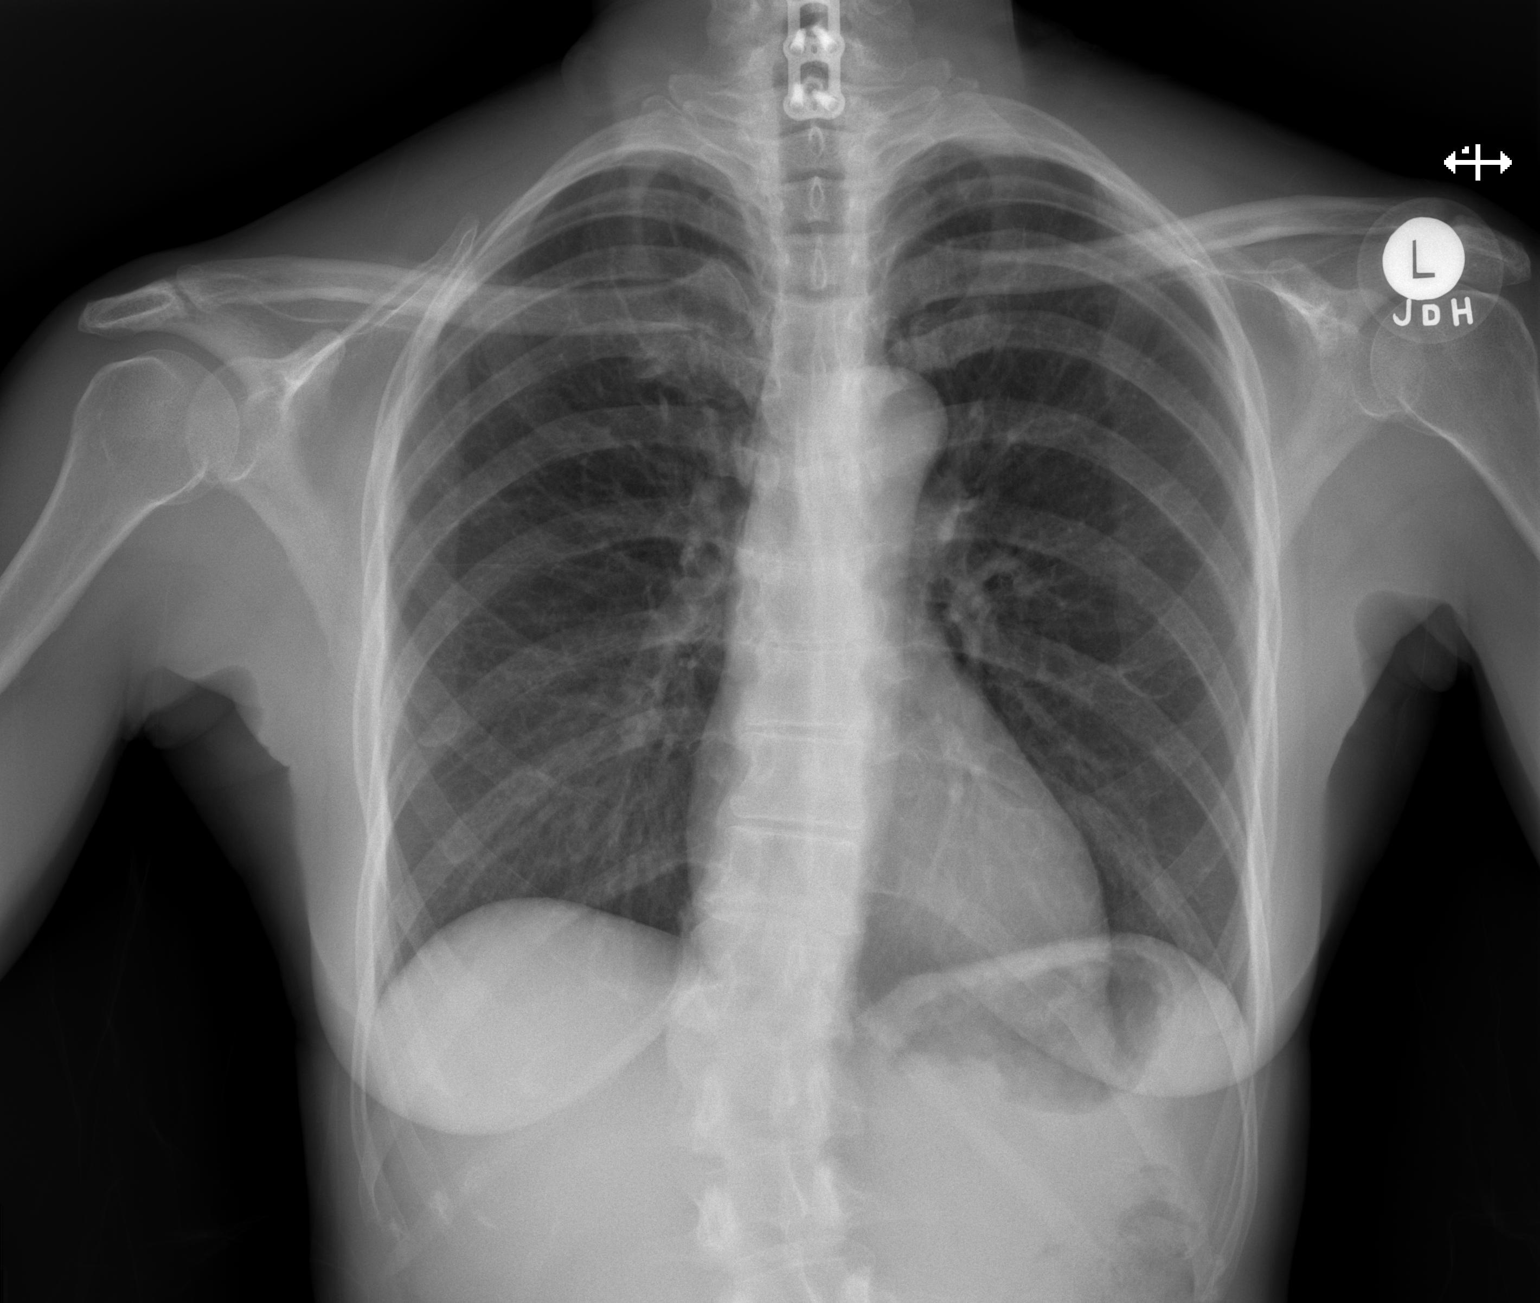

[w chest lat]
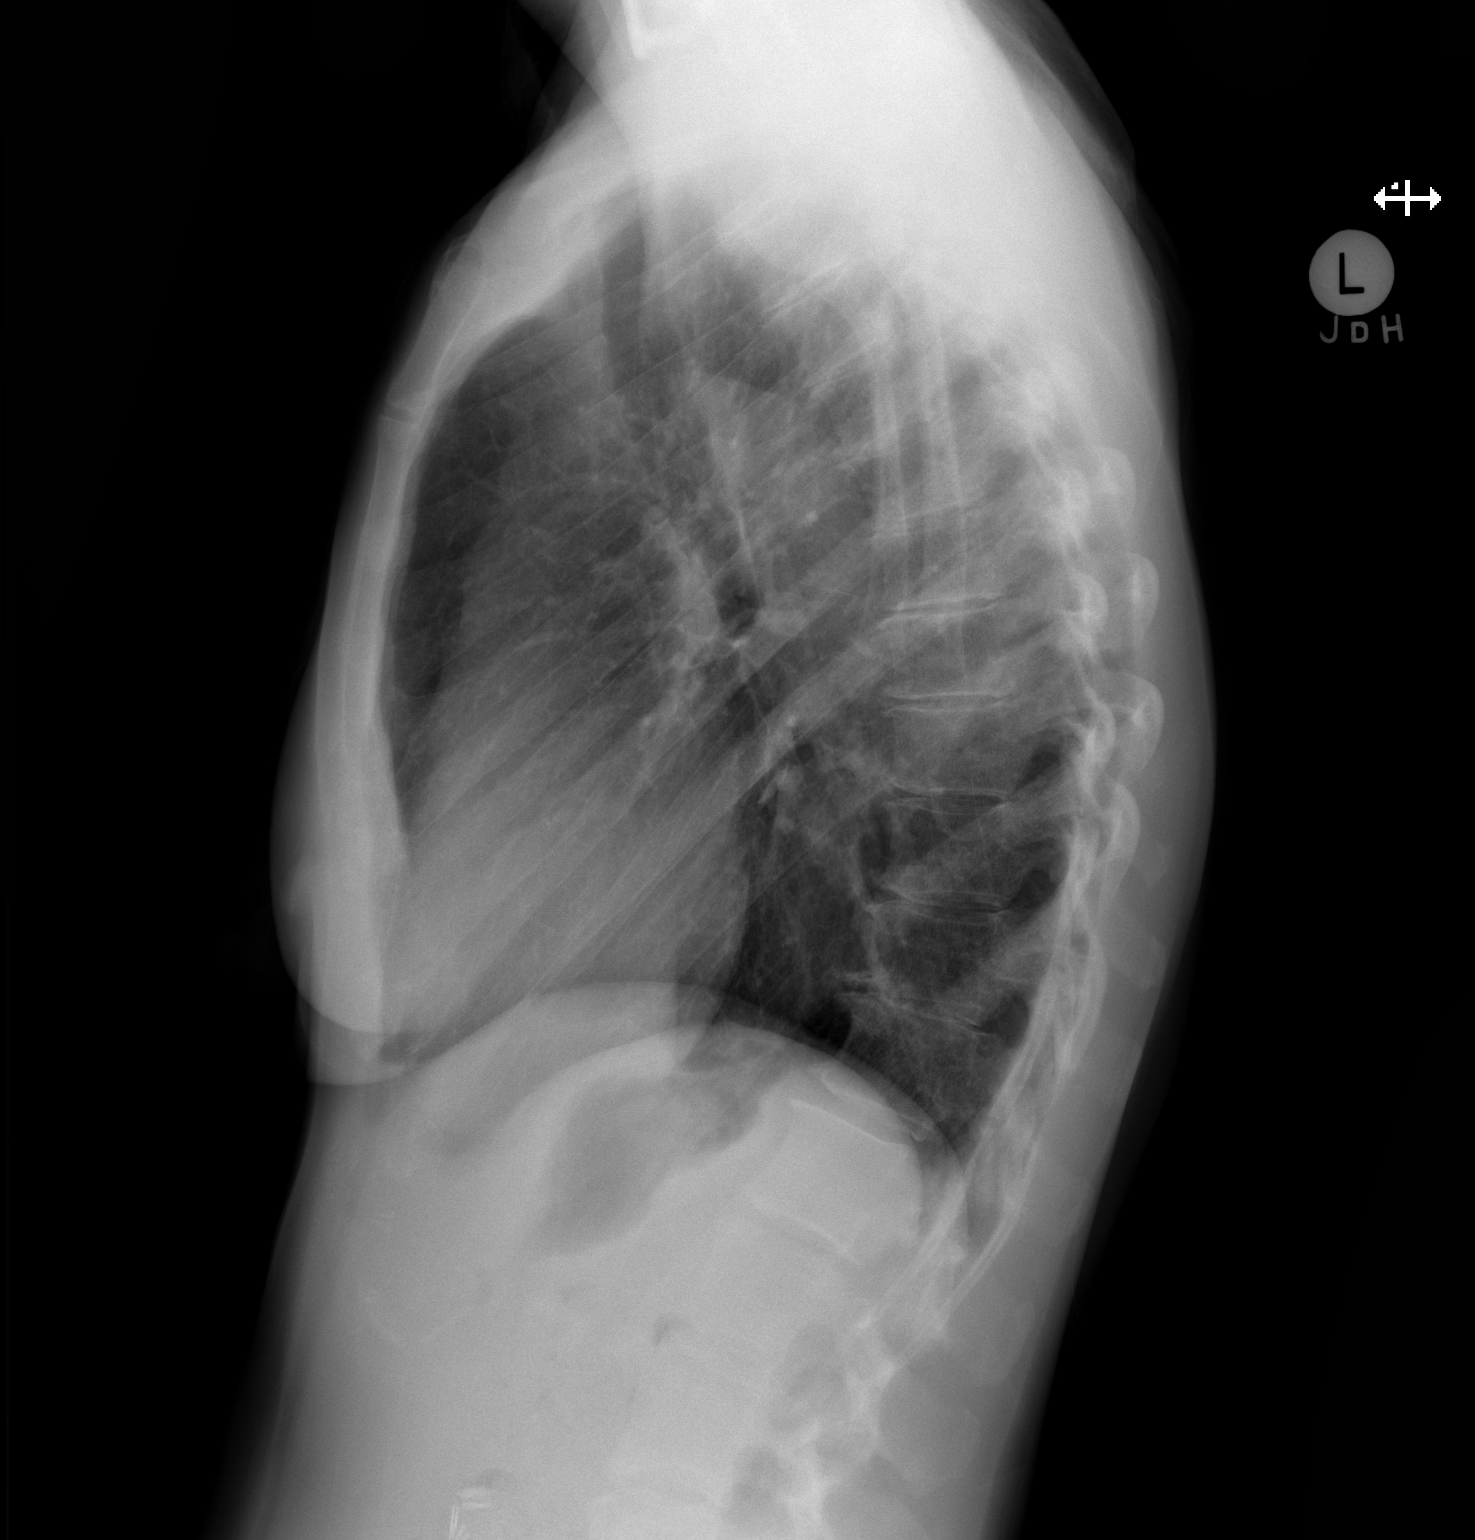

[2 of 2 positions shown; findings below may reference images not displayed]

FINDINGS: Heart and mediastinal contours are within normal limits. No focal
opacities or effusions. No acute bony abnormality.
IMPRESSION: No active cardiopulmonary disease.

## 2019-02-12 NOTE — Telephone Encounter (Signed)
We had talked about possibly scanning CT abdomen pelvis given ongoing abdominal pains as the CT can see spine as well, but I had also given option of doing thoracic and lumbar xrays as well.   We can do both.

## 2019-02-12 NOTE — Telephone Encounter (Signed)
Please call her back in reference to her email to me.  We will go ahead and set her up for CT abdomen pelvis regarding ongoing abdominal pain, pelvic pain, back pain.  She can try an over-the-counter mouthguard or can have her dentist look at making her mouthguard with the occipital and neck pain and clenching of her teeth.  This could be contributing to some of the ear discomfort as well.  It is hard to say about ear infection without looking in her ears

## 2019-02-12 NOTE — Telephone Encounter (Signed)
Patient notified and states that you have the wrong area to be scanned.  She states that she is sending you another email.

## 2019-02-13 NOTE — Telephone Encounter (Signed)
She states that the pain is higher up than abdomin.   So you want me to do CT scan of abdomin and pelvis and thoracic and lumbar?

## 2019-02-14 NOTE — Telephone Encounter (Signed)
If no lower pain at all in back or abdomen, I can change the order to CT abdomen as a first step as this will check abdominal organs and also see the spine to some extent.

## 2019-02-16 ENCOUNTER — Other Ambulatory Visit: Payer: Self-pay

## 2019-02-16 DIAGNOSIS — Z20822 Contact with and (suspected) exposure to covid-19: Secondary | ICD-10-CM

## 2019-02-17 LAB — NOVEL CORONAVIRUS, NAA: SARS-CoV-2, NAA: NOT DETECTED

## 2019-02-22 NOTE — Telephone Encounter (Signed)
See message.

## 2019-02-25 ENCOUNTER — Other Ambulatory Visit: Payer: Self-pay | Admitting: Medical

## 2019-02-25 DIAGNOSIS — M549 Dorsalgia, unspecified: Secondary | ICD-10-CM

## 2019-02-25 DIAGNOSIS — M545 Low back pain, unspecified: Secondary | ICD-10-CM

## 2019-02-25 DIAGNOSIS — G8929 Other chronic pain: Secondary | ICD-10-CM

## 2019-02-25 NOTE — Telephone Encounter (Signed)
I put in order for thoracolumbar xray.   She can go to [place] 8-3:30pm for this, M-F  [facility] [phone]  [address], [place], [place] [address]  [address] [place], [place] [address]

## 2019-02-25 NOTE — Telephone Encounter (Signed)
Patient stated she is not having abdominal pain. Her pain is in her back on the right side mostly. She said it's between hip and rib area. Please advise.

## 2019-02-26 NOTE — Telephone Encounter (Signed)
lmom with detailed message

## 2019-03-06 NOTE — Progress Notes (Deleted)
Office Visit Note  Patient: Shari Lawrence             Date of Birth: July 05, 1960           MRN: [id number]             PCP: Carlena Hurl, PA-C Referring: Carlena Hurl, PA-C Visit Date: 03/19/2019 Occupation: @GUAROCC @  Subjective:  No chief complaint on file.   History of Present Illness: Shari Lawrence is a 58 y.o. female ***   Activities of Daily Living:  Patient reports morning stiffness for *** {minute/hour:19697}.   Patient {ACTIONS;DENIES/REPORTS:21021675::"Denies"} nocturnal pain.  Difficulty dressing/grooming: {ACTIONS;DENIES/REPORTS:21021675::"Denies"} Difficulty climbing stairs: {ACTIONS;DENIES/REPORTS:21021675::"Denies"} Difficulty getting out of chair: {ACTIONS;DENIES/REPORTS:21021675::"Denies"} Difficulty using hands for taps, buttons, cutlery, and/or writing: {ACTIONS;DENIES/REPORTS:21021675::"Denies"}  No Rheumatology ROS completed.   PMFS History:  Patient Active Problem List   Diagnosis Date Noted  . Acute stress reaction 07/24/2018  . Chest pain 07/24/2018  . Hypotension 07/17/2018  . Vertigo 07/17/2018  . Nausea 07/17/2018  . Vaccine counseling 05/21/2018  . Need for influenza vaccination 05/21/2018  . Routine general medical examination at a health care facility 05/21/2018  . Caregiver burden 07/05/2017  . Cyst of skin 07/05/2017  . Transient elevated blood pressure 06/28/2017  . Leg pain, diffuse, left 06/28/2017  . Leg swelling 06/28/2017  . Bruising 06/28/2017  . Myofascial muscle pain 06/28/2017  . High risk medication use 03/30/2017  . Chronic constipation 11/17/2016  . Globus sensation 11/17/2016  . Primary osteoarthritis of both knees 10/05/2016  . Autoimmune disease ([place]) 09/29/2016  . Primary osteoarthritis of both hands 09/29/2016  . Family history of lupus erythematosus 09/20/2016  . Polyarthralgia 05/06/2016  . Clenching of teeth 05/06/2016  . Finger swelling 05/06/2016  . Morning stiffness of joints 05/06/2016  .  Myalgia 05/06/2016  . Family history of rheumatoid arthritis 05/06/2016  . Essential hypertension 12/23/2014  . History of anemia 12/23/2014  . Care provider for parents 12/23/2014  . Post-operative state 11/13/2012  . Hyperlipidemia 11/11/2012  . Hypertension 11/11/2012  . Impaired fasting glucose 10/25/2011  . Gluten intolerance 10/25/2011  . Gastroesophageal reflux disease without esophagitis 10/25/2011  . Anxiety state   . Iron deficiency anemia     Past Medical History:  Diagnosis Date  . Abnormal Pap smear 1988   treated with cryo  . Anemia 11/2010   hematology consult prior; etiology malabsorption and uterine bleeding  . Anxiety   . Gastric polyp   . GERD (gastroesophageal reflux disease)   . H/O bone density study 12/2007  . H/O hysterectomy for benign disease 5/14  . History of echocardiogram 03/2010   normal LV function, EF 60-65%, mild left atrial enlargement  . HTN (hypertension) 03/2010   hospitalization for HTN urgency  . Hyperlipemia   . Internal hemorrhoids   . Lumbar degenerative disc disease   . Migraine   . Myalgia   . Spondylosis, cervical   . Umbilical hernia   . Uterine fibroid    hx/o     Family History  Problem Relation Age of Onset  . Hypertension Mother   . Dementia Mother   . Colon cancer Mother 11  . Deep vein thrombosis Father   . Hypertension Father   . Cancer Father        prostate CA  . Heart disease Father 9       CAD  . Parkinsonism Father   . Hypertension Sister   . Hyperlipidemia Sister   . Hypertension Brother   .  Hyperlipidemia Brother   . Hypertension Maternal Aunt   . Hyperlipidemia Maternal Aunt   . Rheum arthritis Maternal Aunt   . Diabetes Maternal Aunt        1 with type II, 1 with type 1  . Hypertension Brother   . Rheum arthritis Brother   . Diabetes Paternal Aunt        type II  . Breast cancer Neg Hx    Past Surgical History:  Procedure Laterality Date  . CERVICAL FUSION  2010  . CERVIX LESION  DESTRUCTION  1988  . CESAREAN SECTION    . CHOLECYSTECTOMY  2003  . COLONOSCOPY  08/2010   Dr. Collene Mares  . COLONOSCOPY  02/2017   Dr. Collene Mares  . ESOPHAGOGASTRODUODENOSCOPY  08/2010   Dr. Collene Mares  . EXPLORATORY LAPAROTOMY    . HERNIA REPAIR    . MYOMECTOMY  1998  . PELVIC LAPAROSCOPY    . ROBOTIC ASSISTED LAPAROSCOPIC HYSTERECTOMY AND SALPINGECTOMY  10/2012   [facility]; laproscopic due to fibroids  . 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  Has 77 yo daughter.  Her siblings and her don't agree on helping take care of their parents, causes family tension.  As of 11/2014   Immunization History  Administered Date(s) Administered  . Influenza,inj,Quad PF,6+ Mos 05/01/2013, 03/31/2014, 05/17/2017, 05/21/2018  . Td 02/04/2012     Objective: Vital Signs: LMP 10/25/2012 (Approximate)    Physical Exam   Musculoskeletal Exam: ***  CDAI Exam: CDAI Score: - Patient Global: -; Provider Global: - Swollen: -; Tender: - Joint Exam   No joint exam has been documented for this visit   There is currently no information documented on the homunculus. Go to the Rheumatology activity and complete the homunculus joint exam.  Investigation: No additional findings.  Imaging: No results found.  Recent Labs: Lab Results  Component Value Date   WBC 4.3 11/21/2018   HGB 11.9 11/21/2018   PLT 157 11/21/2018   NA 141 11/21/2018   K 4.1 11/21/2018   CL 105 11/21/2018   CO2 28 11/21/2018   GLUCOSE 82 11/21/2018   BUN 15 11/21/2018   CREATININE 1.02 11/21/2018   BILITOT 0.4 11/21/2018   ALKPHOS 55 05/21/2018   AST 21 11/21/2018   ALT 21 11/21/2018   PROT 6.8 11/21/2018   ALBUMIN 4.5  05/21/2018   CALCIUM 10.3 11/21/2018   GFRAA 71 11/21/2018    Speciality Comments: PLQ Eye Exam: 03/09/18 WNL @ [facility]   Procedures:  No procedures performed Allergies: Quinolones, Latex, Levaquin [levofloxacin], Penicillins, Percocet [oxycodone-acetaminophen], Sulfa drugs cross reactors, Vicodin [hydrocodone-acetaminophen], Zoloft [sertraline hcl], and Gluten meal   Assessment / Plan:     Visit Diagnoses: Autoimmune disease ([place])  High risk medication use  Myofascial muscle pain  Trapezius muscle spasm  Trochanteric bursitis of both hips  Primary osteoarthritis of both hands  Primary osteoarthritis of both knees  Family history of rheumatoid arthritis  Family history of lupus erythematosus  History of anemia  History of hyperlipidemia  History of hypertension  History of gastroesophageal reflux (GERD)  History of anxiety  Orders: No orders of the defined types were placed in this encounter.  No orders  of the defined types were placed in this encounter.   Face-to-face time spent with patient was *** minutes. Greater than 50% of time was spent in counseling and coordination of care.  Follow-Up Instructions: No follow-ups on file.   Ofilia Neas, PA-C  Note - This record has been created using Dragon software.  Chart creation errors have been sought, but may not always  have been located. Such creation errors do not reflect on  the standard of medical care.

## 2019-03-13 NOTE — Progress Notes (Deleted)
Office Visit Note  Patient: Shari Lawrence             Date of Birth: 1961/05/23           MRN: [id number]             PCP: Carlena Hurl, PA-C Referring: Carlena Hurl, PA-C Visit Date: 03/14/2019 Occupation: @GUAROCC @  Subjective:  No chief complaint on file.  Plaquenil 200 mg 1 tablet twice daily Monday through Friday.   Last Plaquenil eye exam normal on 03/09/2018.  Most recent CBC/CMP within normal limits on 11/21/2018.  History of Present Illness: Shari Lawrence is a 58 y.o. female ***   Activities of Daily Living:  Patient reports morning stiffness for *** {minute/hour:19697}.   Patient {ACTIONS;DENIES/REPORTS:21021675::"Denies"} nocturnal pain.  Difficulty dressing/grooming: {ACTIONS;DENIES/REPORTS:21021675::"Denies"} Difficulty climbing stairs: {ACTIONS;DENIES/REPORTS:21021675::"Denies"} Difficulty getting out of chair: {ACTIONS;DENIES/REPORTS:21021675::"Denies"} Difficulty using hands for taps, buttons, cutlery, and/or writing: {ACTIONS;DENIES/REPORTS:21021675::"Denies"}  No Rheumatology ROS completed.   PMFS History:  Patient Active Problem List   Diagnosis Date Noted  . Acute stress reaction 07/24/2018  . Chest pain 07/24/2018  . Hypotension 07/17/2018  . Vertigo 07/17/2018  . Nausea 07/17/2018  . Vaccine counseling 05/21/2018  . Need for influenza vaccination 05/21/2018  . Routine general medical examination at a health care facility 05/21/2018  . Caregiver burden 07/05/2017  . Cyst of skin 07/05/2017  . Transient elevated blood pressure 06/28/2017  . Leg pain, diffuse, left 06/28/2017  . Leg swelling 06/28/2017  . Bruising 06/28/2017  . Myofascial muscle pain 06/28/2017  . High risk medication use 03/30/2017  . Chronic constipation 11/17/2016  . Globus sensation 11/17/2016  . Primary osteoarthritis of both knees 10/05/2016  . Autoimmune disease ([place]) 09/29/2016  . Primary osteoarthritis of both hands 09/29/2016  . Family history of lupus  erythematosus 09/20/2016  . Polyarthralgia 05/06/2016  . Clenching of teeth 05/06/2016  . Finger swelling 05/06/2016  . Morning stiffness of joints 05/06/2016  . Myalgia 05/06/2016  . Family history of rheumatoid arthritis 05/06/2016  . Essential hypertension 12/23/2014  . History of anemia 12/23/2014  . Care provider for parents 12/23/2014  . Post-operative state 11/13/2012  . Hyperlipidemia 11/11/2012  . Hypertension 11/11/2012  . Impaired fasting glucose 10/25/2011  . Gluten intolerance 10/25/2011  . Gastroesophageal reflux disease without esophagitis 10/25/2011  . Anxiety state   . Iron deficiency anemia     Past Medical History:  Diagnosis Date  . Abnormal Pap smear 1988   treated with cryo  . Anemia 11/2010   hematology consult prior; etiology malabsorption and uterine bleeding  . Anxiety   . Gastric polyp   . GERD (gastroesophageal reflux disease)   . H/O bone density study 12/2007  . H/O hysterectomy for benign disease 5/14  . History of echocardiogram 03/2010   normal LV function, EF 60-65%, mild left atrial enlargement  . HTN (hypertension) 03/2010   hospitalization for HTN urgency  . Hyperlipemia   . Internal hemorrhoids   . Lumbar degenerative disc disease   . Migraine   . Myalgia   . Spondylosis, cervical   . Umbilical hernia   . Uterine fibroid    hx/o     Family History  Problem Relation Age of Onset  . Hypertension Mother   . Dementia Mother   . Colon cancer Mother 74  . Deep vein thrombosis Father   . Hypertension Father   . Cancer Father        prostate CA  . Heart disease  Father 71       CAD  . Parkinsonism Father   . Hypertension Sister   . Hyperlipidemia Sister   . Hypertension Brother   . Hyperlipidemia Brother   . Hypertension Maternal Aunt   . Hyperlipidemia Maternal Aunt   . Rheum arthritis Maternal Aunt   . Diabetes Maternal Aunt        1 with type II, 1 with type 1  . Hypertension Brother   . Rheum arthritis Brother   .  Diabetes Paternal Aunt        type II  . Breast cancer Neg Hx    Past Surgical History:  Procedure Laterality Date  . CERVICAL FUSION  2010  . CERVIX LESION DESTRUCTION  1988  . CESAREAN SECTION    . CHOLECYSTECTOMY  2003  . COLONOSCOPY  08/2010   Dr. Collene Mares  . COLONOSCOPY  02/2017   Dr. Collene Mares  . ESOPHAGOGASTRODUODENOSCOPY  08/2010   Dr. Collene Mares  . EXPLORATORY LAPAROTOMY    . HERNIA REPAIR    . MYOMECTOMY  1998  . PELVIC LAPAROSCOPY    . ROBOTIC ASSISTED LAPAROSCOPIC HYSTERECTOMY AND SALPINGECTOMY  10/2012   [facility]; laproscopic due to fibroids  . 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  Has 41 yo daughter.  Her siblings and her don't agree on helping take care of their parents, causes family tension.  As of 11/2014   Immunization History  Administered Date(s) Administered  . Influenza,inj,Quad PF,6+ Mos 05/01/2013, 03/31/2014, 05/17/2017, 05/21/2018  . Td 02/04/2012     Objective: Vital Signs: LMP 10/25/2012 (Approximate)    Physical Exam   Musculoskeletal Exam: ***  CDAI Exam: CDAI Score: - Patient Global: -; Provider Global: - Swollen: -; Tender: - Joint Exam   No joint exam has been documented for this visit   There is currently no information documented on the homunculus. Go to the Rheumatology activity and complete the homunculus joint exam.  Investigation: No additional findings.  Imaging: No results found.  Recent Labs: Lab Results  Component Value Date   WBC 4.3 11/21/2018   HGB 11.9 11/21/2018   PLT 157 11/21/2018   NA 141 11/21/2018   K 4.1 11/21/2018   CL 105 11/21/2018   CO2 28 11/21/2018   GLUCOSE 82 11/21/2018   BUN 15 11/21/2018    CREATININE 1.02 11/21/2018   BILITOT 0.4 11/21/2018   ALKPHOS 55 05/21/2018   AST 21 11/21/2018   ALT 21 11/21/2018   PROT 6.8 11/21/2018   ALBUMIN 4.5 05/21/2018   CALCIUM 10.3 11/21/2018   GFRAA 71 11/21/2018    Speciality Comments: PLQ Eye Exam: 03/09/18 WNL @ [facility]   Procedures:  No procedures performed Allergies: Quinolones, Latex, Levaquin [levofloxacin], Penicillins, Percocet [oxycodone-acetaminophen], Sulfa drugs cross reactors, Vicodin [hydrocodone-acetaminophen], Zoloft [sertraline hcl], and Gluten meal   Assessment / Plan:     Visit Diagnoses: No diagnosis found.  Orders: No orders of the defined types were placed in this encounter.  No orders of the defined types were placed in this encounter.   Face-to-face time spent with patient was *** minutes. Greater than 50% of time was spent in counseling and coordination of care.  Follow-Up  Instructions: No follow-ups on file.   Ofilia Neas, PA-C  Note - This record has been created using Dragon software.  Chart creation errors have been sought, but may not always  have been located. Such creation errors do not reflect on  the standard of medical care.

## 2019-03-14 ENCOUNTER — Ambulatory Visit: Payer: BC Managed Care – PPO | Admitting: Physician Assistant

## 2019-03-19 ENCOUNTER — Ambulatory Visit: Payer: BC Managed Care – PPO | Admitting: Physician Assistant

## 2019-03-19 ENCOUNTER — Other Ambulatory Visit: Payer: Self-pay

## 2019-03-19 ENCOUNTER — Encounter: Payer: Self-pay | Admitting: Rheumatology

## 2019-03-19 ENCOUNTER — Ambulatory Visit: Payer: BC Managed Care – PPO | Admitting: Rheumatology

## 2019-03-19 VITALS — BP 163/93 | HR 46 | Resp 12 | Ht 64.0 in | Wt 129.4 lb

## 2019-03-19 DIAGNOSIS — M359 Systemic involvement of connective tissue, unspecified: Secondary | ICD-10-CM

## 2019-03-19 DIAGNOSIS — Z840 Family history of diseases of the skin and subcutaneous tissue: Secondary | ICD-10-CM

## 2019-03-19 DIAGNOSIS — R202 Paresthesia of skin: Secondary | ICD-10-CM

## 2019-03-19 DIAGNOSIS — Z8261 Family history of arthritis: Secondary | ICD-10-CM

## 2019-03-19 DIAGNOSIS — Z8639 Personal history of other endocrine, nutritional and metabolic disease: Secondary | ICD-10-CM

## 2019-03-19 DIAGNOSIS — Z8659 Personal history of other mental and behavioral disorders: Secondary | ICD-10-CM

## 2019-03-19 DIAGNOSIS — M19041 Primary osteoarthritis, right hand: Secondary | ICD-10-CM | POA: Diagnosis not present

## 2019-03-19 DIAGNOSIS — Z8679 Personal history of other diseases of the circulatory system: Secondary | ICD-10-CM

## 2019-03-19 DIAGNOSIS — Z79899 Other long term (current) drug therapy: Secondary | ICD-10-CM | POA: Diagnosis not present

## 2019-03-19 DIAGNOSIS — Z8719 Personal history of other diseases of the digestive system: Secondary | ICD-10-CM

## 2019-03-19 DIAGNOSIS — M19042 Primary osteoarthritis, left hand: Secondary | ICD-10-CM

## 2019-03-19 DIAGNOSIS — M7918 Myalgia, other site: Secondary | ICD-10-CM

## 2019-03-19 DIAGNOSIS — M170 Bilateral primary osteoarthritis of knee: Secondary | ICD-10-CM

## 2019-03-19 NOTE — Progress Notes (Signed)
Office Visit Note  Patient: Shari Lawrence             Date of Birth: 1961/03/26           MRN: [id number]             PCP: Carlena Hurl, PA-C Referring: Carlena Hurl, PA-C Visit Date: 03/19/2019 Occupation: @GUAROCC @  Subjective:  Pain in multiple joints.   History of Present Illness: Shari Lawrence is a 58 y.o. female with history of autoimmune disease.  She states for the last 1 months she has been experiencing some tingling sensation in her right arm.  She also has some right shoulder joint pain.  For the last 1 week she has been experiencing some tingling sensation in the left lower extremity below the knee.  She states the left lower extremity symptoms are started after prolonged sitting and then the paresthesias improved when she got home.  She continues to have a lot of discomfort in her hands.  She has decreased grip strength.  She also complains of discomfort in her bilateral hips.  She also describes some discomfort over the right mastoid area.  Activities of Daily Living:  Patient reports morning stiffness for several hours.   Patient Reports nocturnal pain.  Difficulty dressing/grooming: Reports Difficulty climbing stairs: Denies Difficulty getting out of chair: Reports Difficulty using hands for taps, buttons, cutlery, and/or writing: Reports  Review of Systems  Constitutional: Positive for fatigue. Negative for night sweats, weight gain and weight loss.  HENT: Negative for mouth sores, trouble swallowing, trouble swallowing, mouth dryness and nose dryness.   Eyes: Negative for pain, redness, itching, visual disturbance and dryness.  Respiratory: Negative for cough, shortness of breath, wheezing and difficulty breathing.   Cardiovascular: Negative for chest pain, palpitations, hypertension, irregular heartbeat and swelling in legs/feet.  Gastrointestinal: Negative for blood in stool, constipation and diarrhea.  Endocrine: Negative for increased urination.   Genitourinary: Negative for difficulty urinating, painful urination and vaginal dryness.  Musculoskeletal: Positive for arthralgias, joint pain, myalgias, morning stiffness and myalgias. Negative for joint swelling, muscle weakness and muscle tenderness.  Skin: Negative for color change, rash, hair loss, skin tightness, ulcers and sensitivity to sunlight.  Allergic/Immunologic: Negative for susceptible to infections.  Neurological: Positive for numbness, headaches and weakness. Negative for dizziness, memory loss and night sweats.  Hematological: Negative for swollen glands.  Psychiatric/Behavioral: Negative for depressed mood, confusion and sleep disturbance. The patient is nervous/anxious.     PMFS History:  Patient Active Problem List   Diagnosis Date Noted  . Acute stress reaction 07/24/2018  . Chest pain 07/24/2018  . Hypotension 07/17/2018  . Vertigo 07/17/2018  . Nausea 07/17/2018  . Vaccine counseling 05/21/2018  . Need for influenza vaccination 05/21/2018  . Routine general medical examination at a health care facility 05/21/2018  . Caregiver burden 07/05/2017  . Cyst of skin 07/05/2017  . Transient elevated blood pressure 06/28/2017  . Leg pain, diffuse, left 06/28/2017  . Leg swelling 06/28/2017  . Bruising 06/28/2017  . Myofascial muscle pain 06/28/2017  . High risk medication use 03/30/2017  . Chronic constipation 11/17/2016  . Globus sensation 11/17/2016  . Primary osteoarthritis of both knees 10/05/2016  . Autoimmune disease ([place]) 09/29/2016  . Primary osteoarthritis of both hands 09/29/2016  . Family history of lupus erythematosus 09/20/2016  . Polyarthralgia 05/06/2016  . Clenching of teeth 05/06/2016  . Finger swelling 05/06/2016  . Morning stiffness of joints 05/06/2016  . Myalgia 05/06/2016  .  Family history of rheumatoid arthritis 05/06/2016  . Essential hypertension 12/23/2014  . History of anemia 12/23/2014  . Care provider for parents 12/23/2014  .  Post-operative state 11/13/2012  . Hyperlipidemia 11/11/2012  . Hypertension 11/11/2012  . Impaired fasting glucose 10/25/2011  . Gluten intolerance 10/25/2011  . Gastroesophageal reflux disease without esophagitis 10/25/2011  . Anxiety state   . Iron deficiency anemia     Past Medical History:  Diagnosis Date  . Abnormal Pap smear 1988   treated with cryo  . Anemia 11/2010   hematology consult prior; etiology malabsorption and uterine bleeding  . Anxiety   . Gastric polyp   . GERD (gastroesophageal reflux disease)   . H/O bone density study 12/2007  . H/O hysterectomy for benign disease 5/14  . History of echocardiogram 03/2010   normal LV function, EF 60-65%, mild left atrial enlargement  . HTN (hypertension) 03/2010   hospitalization for HTN urgency  . Hyperlipemia   . Internal hemorrhoids   . Lumbar degenerative disc disease   . Migraine   . Myalgia   . Spondylosis, cervical   . Umbilical hernia   . Uterine fibroid    hx/o     Family History  Problem Relation Age of Onset  . Hypertension Mother   . Dementia Mother   . Colon cancer Mother 47  . Deep vein thrombosis Father   . Hypertension Father   . Cancer Father        prostate CA  . Heart disease Father 31       CAD  . Parkinsonism Father   . Hypertension Sister   . Hyperlipidemia Sister   . Hypertension Brother   . Hyperlipidemia Brother   . Hypertension Maternal Aunt   . Hyperlipidemia Maternal Aunt   . Rheum arthritis Maternal Aunt   . Diabetes Maternal Aunt        1 with type II, 1 with type 1  . Hypertension Brother   . Rheum arthritis Brother   . Diabetes Paternal Aunt        type II  . Breast cancer Neg Hx    Past Surgical History:  Procedure Laterality Date  . CERVICAL FUSION  2010  . CERVIX LESION DESTRUCTION  1988  . CESAREAN SECTION    . CHOLECYSTECTOMY  2003  . COLONOSCOPY  08/2010   Dr. Collene Mares  . COLONOSCOPY  02/2017   Dr. Collene Mares  . ESOPHAGOGASTRODUODENOSCOPY  08/2010   Dr. Collene Mares  .  EXPLORATORY LAPAROTOMY    . HERNIA REPAIR    . MYOMECTOMY  1998  . PELVIC LAPAROSCOPY    . ROBOTIC ASSISTED LAPAROSCOPIC HYSTERECTOMY AND SALPINGECTOMY  10/2012   [facility]; laproscopic due to fibroids  . 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  Has 4 yo daughter.  Her siblings and her don't agree on helping take care of their parents, causes family tension.  As of 11/2014   Immunization History  Administered Date(s) Administered  . Influenza,inj,Quad PF,6+ Mos 05/01/2013, 03/31/2014, 05/17/2017, 05/21/2018  . Td 02/04/2012     Objective: Vital Signs: BP (!) 163/93 (BP Location: Left Arm, Patient Position: Sitting, Cuff Size: Normal)   Pulse (!) 46  Resp 12   Ht 5\' 4"  (1.626 m)   Wt 129 lb 6.4 oz (58.7 kg)   LMP 10/25/2012 (Approximate)   BMI 22.21 kg/m    Physical Exam Vitals signs and nursing note reviewed.  Constitutional:      Appearance: She is well-developed.  HENT:     Head: Normocephalic and atraumatic.  Eyes:     Conjunctiva/sclera: Conjunctivae normal.  Neck:     Musculoskeletal: Normal range of motion.  Cardiovascular:     Rate and Rhythm: Normal rate and regular rhythm.     Heart sounds: Normal heart sounds.  Pulmonary:     Effort: Pulmonary effort is normal.     Breath sounds: Normal breath sounds.  Abdominal:     General: Bowel sounds are normal.     Palpations: Abdomen is soft.  Lymphadenopathy:     Cervical: No cervical adenopathy.  Skin:    General: Skin is warm and dry.     Capillary Refill: Capillary refill takes less than 2 seconds.  Neurological:     Mental Status: She is alert and oriented to person, place, and time.  Psychiatric:         Behavior: Behavior normal.      Musculoskeletal Exam: C-spine thoracic and lumbar spine were in good range of motion.  She had good range of motion of her shoulder joints, elbow joints, wrist joints, MCPs, PIPs and DIPs with no synovitis.  Hip joints, knee joints, ankles, MTPs and PIPs with good range of motion with no synovitis.  CDAI Exam: CDAI Score: - Patient Global: -; Provider Global: - Swollen: -; Tender: - Joint Exam   No joint exam has been documented for this visit   There is currently no information documented on the homunculus. Go to the Rheumatology activity and complete the homunculus joint exam.  Investigation: No additional findings.  Imaging: No results found.  Recent Labs: Lab Results  Component Value Date   WBC 4.3 11/21/2018   HGB 11.9 11/21/2018   PLT 157 11/21/2018   NA 141 11/21/2018   K 4.1 11/21/2018   CL 105 11/21/2018   CO2 28 11/21/2018   GLUCOSE 82 11/21/2018   BUN 15 11/21/2018   CREATININE 1.02 11/21/2018   BILITOT 0.4 11/21/2018   ALKPHOS 55 05/21/2018   AST 21 11/21/2018   ALT 21 11/21/2018   PROT 6.8 11/21/2018   ALBUMIN 4.5 05/21/2018   CALCIUM 10.3 11/21/2018   GFRAA 71 11/21/2018    Speciality Comments: PLQ Eye Exam: 03/09/18 WNL @ [facility]   Procedures:  No procedures performed Allergies: Quinolones, Latex, Levaquin [levofloxacin], Penicillins, Percocet [oxycodone-acetaminophen], Sulfa drugs cross reactors, Vicodin [hydrocodone-acetaminophen], Zoloft [sertraline hcl], and Gluten meal   Assessment / Plan:     Visit Diagnoses: Autoimmune disease ([place]) - ANA 1:320 NO, ds8, RF-,CCP-,History of nasal ulcers and oral ulcers in the past, positive synovitis in left hand MCPs on [place] previously -patient complains of increased fatigue and generalized achiness.  She has been experiencing some paresthesias but she is concerned about.  She states she has been under a lot of stress.  She increased her Plaquenil from 1 tablet to 2  tablet twice daily.  We will obtain labs today.  If she does not show active disease then she can stay on 1 tablet a day.  Otherwise she may increase the dose.  Plan: Urinalysis, Routine w reflex microscopic, Anti-DNA antibody, double-stranded, C3 and C4, Sedimentation rate  High risk medication use - PLQ  200 mg 1 tablet by mouth daily M-F only.Eye Exam: 03/09/18 - Plan: CBC with Differential/Platelet, COMPLETE METABOLIC PANEL WITH GFR.  She has been advised to schedule an eye examination.  Primary osteoarthritis of both hands-she has mild osteoarthritis.  She complains of decreased grip strength.  No synovitis was noted.  Primary osteoarthritis of both knees-chronic pain  Myofascial muscle pain--she has some generalized muscle pain.  Paresthesia of arm-she has been experiencing increased paresthesias in her right arm.  She has seen Dr. love in the past.  We will refer her to [place].  She would prefer to see a female neurologist.  Paresthesia of left leg-she has left lower extremity paresthesias which is been going on for the last 1 week.  It may be related to prolonged sitting.  She will have neurological evaluation.  Family history of lupus erythematosus  Family history of rheumatoid arthritis  History of anxiety  History of hyperlipidemia  History of gastroesophageal reflux (GERD)  History of hypertension-her blood pressure is elevated today.  Have advised her to monitor blood pressure closely and follow-up with her PCP.  Orders: Orders Placed This Encounter  Procedures  . CBC with Differential/Platelet  . COMPLETE METABOLIC PANEL WITH GFR  . Urinalysis, Routine w reflex microscopic  . Anti-DNA antibody, double-stranded  . C3 and C4  . Sedimentation rate  . Ambulatory referral to Neurology   No orders of the defined types were placed in this encounter.   Face-to-face time spent with patient was 30 minutes. Greater than 50% of time was spent in counseling and coordination of  care.  Follow-Up Instructions: Return in about 3 months (around 06/18/2019) for Autoimmune disease.   [name], MD  Note - This record has been created using Editor, commissioning.  Chart creation errors have been sought, but may not always  have been located. Such creation errors do not reflect on  the standard of medical care.

## 2019-03-20 LAB — URINALYSIS, ROUTINE W REFLEX MICROSCOPIC
Bacteria, UA: NONE SEEN /HPF
Bilirubin Urine: NEGATIVE
Glucose, UA: NEGATIVE
Hyaline Cast: NONE SEEN /LPF
Ketones, ur: NEGATIVE
Leukocytes,Ua: NEGATIVE
Nitrite: NEGATIVE
Protein, ur: NEGATIVE
Specific Gravity, Urine: 1.013 (ref 1.001–1.03)
Squamous Epithelial / HPF: NONE SEEN /HPF (ref <=5)
WBC, UA: NONE SEEN /HPF (ref 0–5)
pH: 7.5 (ref 5.0–8.0)

## 2019-03-20 LAB — CBC WITH DIFFERENTIAL/PLATELET
Absolute Monocytes: 342 cells/uL (ref 200–950)
Basophils Absolute: 41 cells/uL (ref 0–200)
Basophils Relative: 0.9 %
Eosinophils Absolute: 153 cells/uL (ref 15–500)
Eosinophils Relative: 3.4 %
HCT: 35.9 % (ref 35.0–45.0)
Hemoglobin: 12.5 g/dL (ref 11.7–15.5)
Lymphs Abs: 1656 cells/uL (ref 850–3900)
MCH: 32.1 pg (ref 27.0–33.0)
MCHC: 34.8 g/dL (ref 32.0–36.0)
MCV: 92.1 fL (ref 80.0–100.0)
MPV: 12.3 fL (ref 7.5–12.5)
Monocytes Relative: 7.6 %
Neutro Abs: 2309 cells/uL (ref 1500–7800)
Neutrophils Relative %: 51.3 %
Platelets: 174 10*3/uL (ref 140–400)
RBC: 3.9 10*6/uL (ref 3.80–5.10)
RDW: 13.1 % (ref 11.0–15.0)
Total Lymphocyte: 36.8 %
WBC: 4.5 10*3/uL (ref 3.8–10.8)

## 2019-03-20 LAB — COMPLETE METABOLIC PANEL WITH GFR
AG Ratio: 1.8 (calc) (ref 1.0–2.5)
ALT: 25 U/L (ref 6–29)
AST: 26 U/L (ref 10–35)
Albumin: 4.4 g/dL (ref 3.6–5.1)
Alkaline phosphatase (APISO): 59 U/L (ref 37–153)
BUN: 15 mg/dL (ref 7–25)
CO2: 28 mmol/L (ref 20–32)
Calcium: 9.4 mg/dL (ref 8.6–10.4)
Chloride: 107 mmol/L (ref 98–110)
Creat: 0.86 mg/dL (ref 0.50–1.05)
GFR, Est African American: 87 mL/min/{1.73_m2} (ref >=60)
GFR, Est Non African American: 75 mL/min/{1.73_m2} (ref >=60)
Globulin: 2.4 g/dL (calc) (ref 1.9–3.7)
Glucose, Bld: 88 mg/dL (ref 65–99)
Potassium: 4.5 mmol/L (ref 3.5–5.3)
Sodium: 142 mmol/L (ref 135–146)
Total Bilirubin: 0.4 mg/dL (ref 0.2–1.2)
Total Protein: 6.8 g/dL (ref 6.1–8.1)

## 2019-03-20 LAB — ANTI-DNA ANTIBODY, DOUBLE-STRANDED: ds DNA Ab: 9 IU/mL — ABNORMAL HIGH

## 2019-03-20 LAB — C3 AND C4
C3 Complement: 116 mg/dL (ref 83–193)
C4 Complement: 21 mg/dL (ref 15–57)

## 2019-03-20 LAB — SEDIMENTATION RATE: Sed Rate: 9 mm/h (ref 0–30)

## 2019-03-20 NOTE — Progress Notes (Signed)
Labs do not indicate a flare of autoimmune disease.  Labs are stable.

## 2019-03-21 ENCOUNTER — Other Ambulatory Visit: Payer: Self-pay

## 2019-03-21 DIAGNOSIS — Z20822 Contact with and (suspected) exposure to covid-19: Secondary | ICD-10-CM

## 2019-03-22 LAB — NOVEL CORONAVIRUS, NAA: SARS-CoV-2, NAA: NOT DETECTED

## 2019-03-29 ENCOUNTER — Other Ambulatory Visit: Payer: Self-pay

## 2019-03-29 ENCOUNTER — Encounter: Payer: Self-pay | Admitting: Medical

## 2019-03-29 ENCOUNTER — Ambulatory Visit: Payer: BC Managed Care – PPO | Admitting: Medical

## 2019-03-29 VITALS — BP 128/84 | HR 62 | Temp 98.0°F | Ht 64.0 in | Wt 126.2 lb

## 2019-03-29 DIAGNOSIS — M542 Cervicalgia: Secondary | ICD-10-CM

## 2019-03-29 DIAGNOSIS — Z636 Dependent relative needing care at home: Secondary | ICD-10-CM

## 2019-03-29 DIAGNOSIS — Z840 Family history of diseases of the skin and subcutaneous tissue: Secondary | ICD-10-CM

## 2019-03-29 DIAGNOSIS — M7918 Myalgia, other site: Secondary | ICD-10-CM

## 2019-03-29 DIAGNOSIS — H9201 Otalgia, right ear: Secondary | ICD-10-CM

## 2019-03-29 DIAGNOSIS — M549 Dorsalgia, unspecified: Secondary | ICD-10-CM

## 2019-03-29 DIAGNOSIS — Z23 Encounter for immunization: Secondary | ICD-10-CM | POA: Diagnosis not present

## 2019-03-29 DIAGNOSIS — M792 Neuralgia and neuritis, unspecified: Secondary | ICD-10-CM

## 2019-03-29 DIAGNOSIS — M2560 Stiffness of unspecified joint, not elsewhere classified: Secondary | ICD-10-CM

## 2019-03-29 DIAGNOSIS — Z8261 Family history of arthritis: Secondary | ICD-10-CM

## 2019-03-29 DIAGNOSIS — M79605 Pain in left leg: Secondary | ICD-10-CM

## 2019-03-29 DIAGNOSIS — Z79899 Other long term (current) drug therapy: Secondary | ICD-10-CM

## 2019-03-29 DIAGNOSIS — M2550 Pain in unspecified joint: Secondary | ICD-10-CM

## 2019-03-29 DIAGNOSIS — M359 Systemic involvement of connective tissue, unspecified: Secondary | ICD-10-CM

## 2019-03-29 DIAGNOSIS — R200 Anesthesia of skin: Secondary | ICD-10-CM

## 2019-03-29 MED ORDER — METHOCARBAMOL 500 MG PO TABS: 500.0000 mg | ORAL_TABLET | Freq: Every evening | ORAL | 0 refills | Status: DC | PRN

## 2019-03-29 NOTE — Patient Instructions (Signed)
Massage Therapy:  Janet Blevins [facility] [address] [place], [place] [address] [phone] [email or web address]   

## 2019-03-29 NOTE — Progress Notes (Signed)
Subjective: Chief Complaint  Patient presents with  . Numbness    right arm  with tingling in left leg    Here for discussion of her ongoing pains.   She has chronic muscle pains, multiple joint pains, continues to see rheumatology, Dr. Estanislado Pandy.  However, she notes that Dr. Estanislado Pandy has stated that her recent ongoing complaints are not related to her autoimmune disease.   Dr. Estanislado Pandy referred her neurology but her apt isn't until late November.  She notes ongoing pains behind right ear that radiates down neck and into right arm with numbness and tingling.  She notes if she sits for prolonged periods she gets numbness in legs, particularly the left.   She wonders if she needs head imaging to rule out MS or other.  She notes chronic fatigue but also doesn't sleep well.  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    She is paying for his care with her own funds.  She is compliant with her medications.  She notes ongoing pains in right chest wall unchanged.   She denies dyspnea.    She notes the pain she was having in her abdomen a few months ago finally resolved.  sometimes can't turn over in bed due to pain in areas  She denies sore throat, URI symptoms, fever, nausea, vomiting.    No other aggravating or relieving factors. No other complaint.  Past Medical History:  Diagnosis Date  . Abnormal Pap smear 1988   treated with cryo  . Anemia 11/2010   hematology consult prior; etiology malabsorption and uterine bleeding  . Anxiety   . Gastric polyp   . GERD (gastroesophageal reflux disease)   . H/O bone density study 12/2007  . H/O hysterectomy for benign disease 5/14  . History of echocardiogram 03/2010   normal LV function, EF 60-65%, mild left atrial enlargement  . HTN (hypertension) 03/2010   hospitalization for HTN urgency  . Hyperlipemia   .  Internal hemorrhoids   . Lumbar degenerative disc disease   . Migraine   . Myalgia   . Spondylosis, cervical   . Umbilical hernia   . Uterine fibroid    hx/o    Current Outpatient Medications on File Prior to Visit  Medication Sig Dispense Refill  . acetaminophen (TYLENOL) 500 MG tablet Take 1 tablet (500 mg total) by mouth every 6 (six) hours as needed. (Patient taking differently: Take 500 mg by mouth every 6 (six) hours as needed for mild pain. ) 30 tablet 0  . ALPRAZolam (XANAX) 0.5 MG tablet Take 1 tablet (0.5 mg total) by mouth 2 (two) times daily as needed for anxiety. 45 tablet 1  . Ascorbic Acid (VITAMIN C) 1000 MG tablet Take 1,000 mg by mouth daily.     Marland Kitchen atorvastatin (LIPITOR) 20 MG tablet Take 1 tablet (20 mg total) by mouth daily. 90 tablet 3  . B Complex Vitamins (B COMPLEX PO) Take 1 tablet by mouth daily.     . calcium carbonate (OS-CAL) 600 MG TABS Take 600 mg by mouth daily.     Marland Kitchen CINNAMON PO Take by mouth.    . diclofenac sodium (VOLTAREN) 1 % GEL 3 grams to 3 large joints up to 3 times daily 3 Tube 3  . estradiol (VIVELLE-DOT) 0.0375 MG/24HR Place 1 patch onto the skin 2 (two) times  a week. 24 patch 3  . fish oil-omega-3 fatty acids 1000 MG capsule Take 2 g by mouth daily.    . Ginger, Zingiber officinalis, (GINGER ROOT PO) Take 1 tablet by mouth daily.     . hydroxychloroquine (PLAQUENIL) 200 MG tablet TAKE 1 TABLET BY MOUTH TWICE DAILY MONDAY THROUGH FRIDAY 120 tablet 1  . losartan (COZAAR) 25 MG tablet TAKE 1 TABLET BY MOUTH EVERY DAY 90 tablet 1  . Multiple Vitamin (MULTIVITAMIN) tablet Take 1 tablet by mouth daily.     Marland Kitchen NEXIUM 40 MG capsule Take 40 mg by mouth daily.     . Probiotic Product (PROBIOTIC ADVANCED PO) Take 1 tablet by mouth daily.     . TURMERIC PO Take 1 tablet by mouth daily.      No current facility-administered medications on file prior to visit.       Objective: BP 128/84   Pulse 62   Temp 98 F (36.7 C)   Ht 5\' 4"  (1.626 m)   Wt  126 lb 3.2 oz (57.2 kg)   LMP 10/25/2012 (Approximate)   SpO2 98%   BMI 21.66 kg/m   Gen: lean AA female, nad HENT unremarkable, no TM erythema, no ear canal abnormality, no pre or post auricular lymphadenopathy No skin abnormality noted Neck: Tender over right and posterolateral neck, but relatively normal ROM, no mass, no thyromegaly, no lymphadenopathy Back: tender right upper back in rhomboids and supraspinatus, tender throughout muscle and soft tissue of back in general, pain with ROM which is relatively full, mild scoliosis Pain noted in hips with leg and hip ROM, left hip ROM mildly reduced, normal ROM right hip No edema Pulses WNL Lungs clear Heart rrr, normal s1s2, no murmurs Psych: peasant, good eye contact, answers questions approprietly Neuro: Cn2-12 intact, DTRs 1+ throughout, otherwise nonfocal exam   Assessment: Encounter Diagnoses  Name Primary?  Marland Kitchen Upper back pain Yes  . Neck pain   . Otalgia of right ear   . Need for influenza vaccination   . Autoimmune disease ([place])   . Care provider for parents   . Caregiver burden   . Family history of rheumatoid arthritis   . Family history of lupus erythematosus   . High risk medication use   . Leg pain, diffuse, left   . Myofascial muscle pain   . Morning stiffness of joints   . Polyarthralgia   . Radicular pain in right arm   . Numbness of leg      Plan: Complicated story.   She has ongoing numerous pains, she is a caregiver for her father and although she wants more help taking care of him, she is scared for too many people to come in to the house, potentially exposing him or her to covid.   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  regarding her pains, we discussed potentially CT or MRI of C, T, and L spine given the numerous pains, right arm radicular symptoms, left leg numbness, chronic right chest wall pain.   We also discussed head imaging to rule  out MS or other given the chronic fatigue, paresthesias, pains.    She notes that her rheumatologist Dr. Estanislado Pandy doesn't feel like her symptoms today are specifically related to her autoimmune disease, but says their conversation is limited beyond that.  Rheumatology recently referred her to neurology .   Her appt isn't available until late November.  I  encouraged her to pursue massage therapy again as this has helped in the past.  continue current medications.  Add robaxin prn for myalgias.   We discussed risks, benefits and proper use of medication.   We discussed use of xanax prn for sleep and anxiety QHS.    She will still plan to see neurology.  If desired we can try and move forward with imaging vs referral to ortho for another opinion.  She will let me know.  Counseled on the influenza virus vaccine.  Vaccine information sheet given.  Influenza vaccine given after consent obtained  Spent > 45 minutes face to face with patient in discussion of symptoms, evaluation, plan and recommendations.

## 2019-03-30 DIAGNOSIS — M792 Neuralgia and neuritis, unspecified: Secondary | ICD-10-CM | POA: Insufficient documentation

## 2019-03-30 DIAGNOSIS — R200 Anesthesia of skin: Secondary | ICD-10-CM | POA: Insufficient documentation

## 2019-03-30 DIAGNOSIS — M542 Cervicalgia: Secondary | ICD-10-CM | POA: Insufficient documentation

## 2019-04-08 ENCOUNTER — Encounter: Payer: Self-pay | Admitting: Neurology

## 2019-04-08 ENCOUNTER — Other Ambulatory Visit: Payer: Self-pay

## 2019-04-08 ENCOUNTER — Ambulatory Visit: Payer: BC Managed Care – PPO | Admitting: Neurology

## 2019-04-08 VITALS — BP 132/78 | HR 66 | Temp 97.5°F | Ht 64.0 in | Wt 128.0 lb

## 2019-04-08 DIAGNOSIS — M542 Cervicalgia: Secondary | ICD-10-CM | POA: Diagnosis not present

## 2019-04-08 DIAGNOSIS — R202 Paresthesia of skin: Secondary | ICD-10-CM

## 2019-04-08 MED ORDER — GABAPENTIN 100 MG PO CAPS: 100.0000 mg | ORAL_CAPSULE | Freq: Three times a day (TID) | ORAL | 11 refills | Status: DC

## 2019-04-08 MED ORDER — ALPRAZOLAM 0.5 MG PO TABS: 0.5000 mg | ORAL_TABLET | ORAL | 0 refills | Status: DC | PRN

## 2019-04-08 NOTE — Progress Notes (Signed)
PATIENT: Shari Lawrence DOB: [birth year]  Chief Complaint  Patient presents with  . Numbness    Reports intermittent numbness/tingling in all four extremities.  Also, she has feeling of "tightness" in her hands, especially in the mornings.    Marland Kitchen PCP    Tysinger, [name], PA-C  . Rheumatology    [name], MD - referring provider     HISTORICAL  Shari Lawrence is a 58 year old female, seen in request by rheumatologist Dr. [name], and primary care physician Dr. Carlena Hurl, for evaluation of neck pain, radiating pain to right arm, initial evaluation was on April 08, 2019.  I have reviewed and summarized the referring note from the referring physician.  She has past medical history of hypertension, hyperlipidemia, autoimmune disease, taking Plaquenil.  She did have a history of cervical decompression surgery in 2010, prior to the surgery, she presented with right cervical radiculopathy.  Since April 2020, she began to notice recurrent right neck pain, radiating pain to right shoulder, right arm, sometimes woke her up from sleep, she is helping her elderly parents, sometimes bearing weight of her father with her right shoulder, in addition, she complains of intermittent bilateral lower extremity numbness since July 2020, denies significant gait abnormality, no bowel bladder incontinence.  She also complains of low back pain, but denies shooting pain to bilateral lower extremities.  REVIEW OF SYSTEMS: Full 14 system review of systems performed and notable only for as above All other review of systems were negative.  ALLERGIES: Allergies  Allergen Reactions  . Quinolones Anaphylaxis and Other (See Comments)    Whelps developed all over her body  . Latex Itching and Other (See Comments)    Skin yellowing  . Levaquin [Levofloxacin] Hives    Throat swelling  . Penicillins   . Percocet [Oxycodone-Acetaminophen]   . Sulfa Drugs Cross Reactors Itching, Swelling  and Other (See Comments)    Red face  . Vicodin [Hydrocodone-Acetaminophen] Itching and Nausea And Vomiting  . Zoloft [Sertraline Hcl]     Felt like a zombi   . Gluten Meal     Other reaction(s): Other (See Comments) IBS    HOME MEDICATIONS: Current Outpatient Medications  Medication Sig Dispense Refill  . acetaminophen (TYLENOL) 500 MG tablet Take 1 tablet (500 mg total) by mouth every 6 (six) hours as needed. (Patient taking differently: Take 500 mg by mouth every 6 (six) hours as needed for mild pain. ) 30 tablet 0  . ALPRAZolam (XANAX) 0.5 MG tablet Take 1 tablet (0.5 mg total) by mouth 2 (two) times daily as needed for anxiety. 45 tablet 1  . Ascorbic Acid (VITAMIN C) 1000 MG tablet Take 1,000 mg by mouth daily.     Marland Kitchen atorvastatin (LIPITOR) 20 MG tablet Take 1 tablet (20 mg total) by mouth daily. 90 tablet 3  . B Complex Vitamins (B COMPLEX PO) Take 1 tablet by mouth daily.     . calcium carbonate (OS-CAL) 600 MG TABS Take 600 mg by mouth daily.     Marland Kitchen CINNAMON PO Take by mouth.    . diclofenac sodium (VOLTAREN) 1 % GEL 3 grams to 3 large joints up to 3 times daily 3 Tube 3  . estradiol (VIVELLE-DOT) 0.0375 MG/24HR Place 1 patch onto the skin 2 (two) times a week. 24 patch 3  . fish oil-omega-3 fatty acids 1000 MG capsule Take 2 g by mouth daily.    . Ginger, Zingiber officinalis, (GINGER ROOT PO)  Take 1 tablet by mouth daily.     . hydroxychloroquine (PLAQUENIL) 200 MG tablet TAKE 1 TABLET BY MOUTH TWICE DAILY MONDAY THROUGH FRIDAY 120 tablet 1  . losartan (COZAAR) 25 MG tablet TAKE 1 TABLET BY MOUTH EVERY DAY 90 tablet 1  . methocarbamol (ROBAXIN) 500 MG tablet Take 1 tablet (500 mg total) by mouth at bedtime as needed for muscle spasms. 30 tablet 0  . Multiple Vitamin (MULTIVITAMIN) tablet Take 1 tablet by mouth daily.     Marland Kitchen NEXIUM 40 MG capsule Take 40 mg by mouth daily.     . Probiotic Product (PROBIOTIC ADVANCED PO) Take 1 tablet by mouth daily.     . TURMERIC PO Take 1  tablet by mouth daily.      No current facility-administered medications for this visit.     PAST MEDICAL HISTORY: Past Medical History:  Diagnosis Date  . Abnormal Pap smear 1988   treated with cryo  . Anemia 11/2010   hematology consult prior; etiology malabsorption and uterine bleeding  . Anxiety   . Gastric polyp   . GERD (gastroesophageal reflux disease)   . H/O bone density study 12/2007  . H/O hysterectomy for benign disease 5/14  . History of echocardiogram 03/2010   normal LV function, EF 60-65%, mild left atrial enlargement  . HTN (hypertension) 03/2010   hospitalization for HTN urgency  . Hyperlipemia   . Internal hemorrhoids   . Lumbar degenerative disc disease   . Migraine   . Myalgia   . Numbness and tingling   . Spondylosis, cervical   . Umbilical hernia   . Uterine fibroid    hx/o     PAST SURGICAL HISTORY: Past Surgical History:  Procedure Laterality Date  . CERVICAL FUSION  2010  . CERVIX LESION DESTRUCTION  1988  . CESAREAN SECTION    . CHOLECYSTECTOMY  2003  . COLONOSCOPY  08/2010   Dr. Collene Mares  . COLONOSCOPY  02/2017   Dr. Collene Mares  . ESOPHAGOGASTRODUODENOSCOPY  08/2010   Dr. Collene Mares  . EXPLORATORY LAPAROTOMY    . HERNIA REPAIR    . MYOMECTOMY  1998  . PELVIC LAPAROSCOPY    . ROBOTIC ASSISTED LAPAROSCOPIC HYSTERECTOMY AND SALPINGECTOMY  10/2012   [facility]; laproscopic due to fibroids  . UMBILICAL HERNIA REPAIR  10/2013   infected laparoscopic port, mesh placement    FAMILY HISTORY: Family History  Problem Relation Age of Onset  . Hypertension Mother   . Dementia Mother   . Colon cancer Mother 23  . Deep vein thrombosis Father   . Hypertension Father   . Cancer Father        prostate CA  . Heart disease Father 64       CAD  . Parkinsonism Father   . Hypertension Sister   . Hyperlipidemia Sister   . Hypertension Brother   . Hyperlipidemia Brother   . Hypertension Maternal Aunt   . Hyperlipidemia Maternal Aunt   . Rheum arthritis Maternal Aunt    . Diabetes Maternal Aunt        1 with type II, 1 with type 1  . Hypertension Brother   . Rheum arthritis Brother   . Diabetes Paternal Aunt        type II  . Breast cancer Neg Hx     SOCIAL HISTORY: Social History   Socioeconomic History  . Marital status: Divorced    Spouse name: Not on file  . Number of children: 1  .  Years of education: college  . Highest education level: Doctorate  Occupational History    Employer: [employer]    Comment: molecular Production designer, theatre/television/film, PhD candidate, [employer]  Social Needs  . Financial resource strain: Not on file  . Food insecurity    Worry: Not on file    Inability: Not on file  . Transportation needs    Medical: Not on file    Non-medical: Not on file  Tobacco Use  . Smoking status: Never Smoker  . Smokeless tobacco: Never Used  Substance and Sexual Activity  . Alcohol use: No  . Drug use: No  . Sexual activity: Not Currently    Partners: Male    Birth control/protection: Surgical    Comment: hysterectomy  Lifestyle  . Physical activity    Days per week: Not on file    Minutes per session: Not on file  . Stress: Not on file  Relationships  . Social Herbalist on phone: Not on file    Gets together: Not on file    Attends religious service: Not on file    Active member of club or organization: Not on file    Attends meetings of clubs or organizations: Not on file    Relationship status: Not on file  . Intimate partner violence    Fear of current or ex partner: Not on file    Emotionally abused: Not on file    Physically abused: Not on file    Forced sexual activity: Not on file  Other Topics Concern  . 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  Has 37 yo daughter.  Her  siblings and her don't agree on helping take care of their parents, causes family tension.  As of 11/2014      Right-handed.   0.5 cup caffeine per day.   Her father lives with her.     PHYSICAL EXAM   Vitals:   04/08/19 1440  BP: 132/78  Pulse: 66  Temp: (!) 97.5 F (36.4 C)  Weight: 128 lb (58.1 kg)  Height: 5\' 4"  (1.626 m)    Not recorded      Body mass index is 21.97 kg/m.  PHYSICAL EXAMNIATION:  Gen: NAD, conversant, well nourised, well groomed                     Cardiovascular: Regular rate rhythm, no peripheral edema, warm, nontender. Eyes: Conjunctivae clear without exudates or hemorrhage Neck: Supple, no carotid bruits. Pulmonary: Clear to auscultation bilaterally   NEUROLOGICAL EXAM:  MENTAL STATUS: Speech:    Speech is normal; fluent and spontaneous with normal comprehension.  Cognition:     Orientation to time, place and person     Normal recent and remote memory     Normal Attention span and concentration     Normal Language, naming, repeating,spontaneous speech     Fund of knowledge   CRANIAL NERVES: CN II: Visual fields are full to confrontation.  Pupils are round equal and briskly reactive to light. CN III, IV, VI: extraocular movement are normal. No ptosis. CN V: Facial sensation is intact to pinprick in all 3 divisions bilaterally. Corneal responses are intact.  CN VII: Face is symmetric with normal eye closure  and smile. CN VIII: Hearing is normal to causal conversation. CN IX, X: Palate elevates symmetrically. Phonation is normal. CN XI: Head turning and shoulder shrug are intact CN XII: Tongue is midline with normal movements and no atrophy.  MOTOR: There is no pronator drift of out-stretched arms. Muscle bulk and tone are normal. Muscle strength is normal.  REFLEXES: Reflexes are 2+ and symmetric at the biceps, triceps, knees, and ankles. Plantar responses are flexor.  SENSORY: Intact to light touch, pinprick, positional sensation  and vibratory sensation are intact in fingers and toes.  COORDINATION: Rapid alternating movements and fine finger movements are intact. There is no dysmetria on finger-to-nose and heel-knee-shin.    GAIT/STANCE: Posture is normal. Gait is steady with normal steps, base, arm swing, and turning. Heel and toe walking are normal. Tandem gait is normal.  Romberg is absent.   DIAGNOSTIC DATA (LABS, IMAGING, TESTING) - I reviewed patient records, labs, notes, testing and imaging myself where available.   ASSESSMENT AND PLAN  Shari Lawrence is a 58 y.o. female   Neck pain, radiating pain to right shoulder right upper extremities  Suggestive of right cervical radiculopathy  Proceed with MRI of cervical spine   Marcial Pacas, M.D. Ph.D.  [facility] [address], [place], [place] [address] Ph: [phone] Fax: [phone]  CC: [name], MD, Tysinger, [name], PA-C

## 2019-04-10 ENCOUNTER — Telehealth: Payer: Self-pay | Admitting: Neurology

## 2019-04-10 NOTE — Telephone Encounter (Signed)
Scheduled at [facility] for 04/18/19.

## 2019-04-10 NOTE — Telephone Encounter (Signed)
[employer] Auth: [id number] (exp. 04/09/19 to 10/05/19) order faxed to [facility] they will reach out to the patient to schedule

## 2019-04-20 ENCOUNTER — Other Ambulatory Visit: Payer: Self-pay

## 2019-04-20 DIAGNOSIS — Z20822 Contact with and (suspected) exposure to covid-19: Secondary | ICD-10-CM

## 2019-04-22 LAB — NOVEL CORONAVIRUS, NAA: SARS-CoV-2, NAA: NOT DETECTED

## 2019-05-08 ENCOUNTER — Telehealth: Payer: Self-pay | Admitting: Neurology

## 2019-05-08 NOTE — Telephone Encounter (Signed)
Please call patient, MRI of cervical spine on April 18, 2019 from [facility] showed solid fusion at C5-6, C6-7, degenerative changes at rest of the cervical levels,  Mild central stenosis at C3-4, but no evidence of cord compression\ bulging disc at C4-5, with central canal stenosis, minimum cord compression, moderate right, mild left foraminal stenosis, C 71, mild canal stenosis, minimum bilateral foraminal narrowing.  Please check her symptoms, if she has worsening neck pain, radiating pain to bilateral upper extremity, we will proceed with EMG nerve conduction study, if her symptoms has much improved, it is okay to keep her scheduled follow-up appointment

## 2019-05-09 ENCOUNTER — Ambulatory Visit: Payer: BC Managed Care – PPO | Admitting: Neurology

## 2019-05-09 NOTE — Telephone Encounter (Signed)
I called the patient and she verbalized understanding of her MRI results.  States she has tried to change the way she sleeps and the way she lifts things.  Symptoms are a little better.  She would like to keep her pending follow up and hold off on NCV/EMG for now.

## 2019-05-14 ENCOUNTER — Ambulatory Visit: Payer: BC Managed Care – PPO | Admitting: Neurology

## 2019-05-15 ENCOUNTER — Ambulatory Visit: Payer: BC Managed Care – PPO | Admitting: Neurology

## 2019-05-20 ENCOUNTER — Other Ambulatory Visit: Payer: Self-pay | Admitting: Obstetrics and Gynecology

## 2019-05-20 DIAGNOSIS — Z1231 Encounter for screening mammogram for malignant neoplasm of breast: Secondary | ICD-10-CM

## 2019-05-21 NOTE — Progress Notes (Signed)
PATIENT: Shari Lawrence DOB: March 18, 1961  REASON FOR VISIT: follow up HISTORY FROM: patient  HISTORY OF PRESENT ILLNESS: Today 05/22/19  HISTORY  Shari Lawrence is a 58 year old female, seen in request by rheumatologist Dr. [name], and primary care physician Dr. Carlena Hurl, for evaluation of neck pain, radiating pain to right arm, initial evaluation was on April 08, 2019.  I have reviewed and summarized the referring note from the referring physician.  She has past medical history of hypertension, hyperlipidemia, autoimmune disease, taking Plaquenil.  She did have a history of cervical decompression surgery in 2010, prior to the surgery, she presented with right cervical radiculopathy.  Since April 2020, she began to notice recurrent right neck pain, radiating pain to right shoulder, right arm, sometimes woke her up from sleep, she is helping her elderly parents, sometimes bearing weight of her father with her right shoulder, in addition, she complains of intermittent bilateral lower extremity numbness since July 2020, denies significant gait abnormality, no bowel bladder incontinence.  She also complains of low back pain, but denies shooting pain to bilateral lower extremities.  Update May 22, 2019 SS: MRI of cervical spine April 18, 2019 showed solid fusion at C5-6, C6-7, degenerative changes at the rest of cervical levels  Mild central stenosis at C3-4, but no evidence of cord compression,  bulging disc at C4-5, with central canal stenosis, minimum cord compression, moderate right and mild left foraminal stenosis, C6-C7, mild canal stenosis, minimum bilateral foraminal narrowing.  She indicates 60% improvement since last seen.  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  She is not taking  gabapentin consistently, in fact, rarely takes.  She reports continued pain to the base of her neck, on the left side, may radiate down the right arm, describes it as numbness, that can turn into pain.  It was daily, is now much better.  She is no longer having the numbness to her left leg.  She denies urinary or bowel incontinence. 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  She presents today for follow-up unaccompanied.  REVIEW OF SYSTEMS: Out of a complete 14 system review of symptoms, the patient complains only of the following symptoms, and all other reviewed systems are negative.  Numbness  ALLERGIES: Allergies  Allergen Reactions   Quinolones Anaphylaxis and Other (See Comments)    Whelps developed all over her body   Latex Itching and Other (See Comments)    Skin yellowing   Levaquin [Levofloxacin] Hives    Throat swelling   Penicillins    Percocet [Oxycodone-Acetaminophen]    Sulfa Drugs Cross Reactors Itching, Swelling and Other (See Comments)    Red face   Vicodin [Hydrocodone-Acetaminophen] Itching and Nausea And Vomiting   Zoloft [Sertraline Hcl]     Felt like a zombi    Gluten Meal     Other reaction(s): Other (See Comments) IBS    HOME MEDICATIONS: Outpatient Medications Prior to Visit  Medication Sig Dispense Refill   acetaminophen (TYLENOL) 500 MG tablet Take 1 tablet (500 mg total) by mouth every 6 (six) hours as needed. (Patient taking differently: Take 500 mg by mouth every 6 (six) hours as needed for mild pain. ) 30 tablet 0   ALPRAZolam (XANAX) 0.5 MG tablet Take 1 tablet (0.5 mg total) by mouth 2 (two)  times daily as needed for anxiety. 45 tablet 1   Ascorbic Acid (VITAMIN C) 1000 MG tablet Take 1,000 mg by mouth daily.      atorvastatin (LIPITOR) 20 MG tablet Take 1 tablet (20 mg total) by mouth daily. 90 tablet 3   B Complex Vitamins (B COMPLEX PO) Take 1 tablet by mouth daily.      calcium carbonate (OS-CAL) 600 MG TABS Take 600 mg by mouth  daily.      CINNAMON PO Take by mouth.     diclofenac sodium (VOLTAREN) 1 % GEL 3 grams to 3 large joints up to 3 times daily 3 Tube 3   estradiol (VIVELLE-DOT) 0.0375 MG/24HR Place 1 patch onto the skin 2 (two) times a week. 24 patch 3   fish oil-omega-3 fatty acids 1000 MG capsule Take 2 g by mouth daily.     Ginger, Zingiber officinalis, (GINGER ROOT PO) Take 1 tablet by mouth daily.      hydroxychloroquine (PLAQUENIL) 200 MG tablet TAKE 1 TABLET BY MOUTH TWICE DAILY MONDAY THROUGH FRIDAY 120 tablet 1   losartan (COZAAR) 25 MG tablet TAKE 1 TABLET BY MOUTH EVERY DAY 90 tablet 1   Multiple Vitamin (MULTIVITAMIN) tablet Take 1 tablet by mouth daily.      NEXIUM 40 MG capsule Take 40 mg by mouth daily.      Probiotic Product (PROBIOTIC ADVANCED PO) Take 1 tablet by mouth daily.      TURMERIC PO Take 1 tablet by mouth daily.      gabapentin (NEURONTIN) 100 MG capsule Take 1 capsule (100 mg total) by mouth 3 (three) times daily. (Patient not taking: Reported on 05/22/2019) 90 capsule 11   methocarbamol (ROBAXIN) 500 MG tablet Take 1 tablet (500 mg total) by mouth at bedtime as needed for muscle spasms. (Patient not taking: Reported on 05/22/2019) 30 tablet 0   ALPRAZolam (XANAX) 0.5 MG tablet Take 1 tablet (0.5 mg total) by mouth as needed for anxiety. 3 tablet 0   No facility-administered medications prior to visit.     PAST MEDICAL HISTORY: Past Medical History:  Diagnosis Date   Abnormal Pap smear 1988   treated with cryo   Anemia 11/2010   hematology consult prior; etiology malabsorption and uterine bleeding   Anxiety    Gastric polyp    GERD (gastroesophageal reflux disease)    H/O bone density study 12/2007   H/O hysterectomy for benign disease 5/14   History of echocardiogram 03/2010   normal LV function, EF 60-65%, mild left atrial enlargement   HTN (hypertension) 03/2010   hospitalization for HTN urgency   Hyperlipemia    Internal hemorrhoids     Lumbar degenerative disc disease    Migraine    Myalgia    Numbness and tingling    Spondylosis, cervical    Umbilical hernia    Uterine fibroid    hx/o     PAST SURGICAL HISTORY: Past Surgical History:  Procedure Laterality Date   CERVICAL FUSION  2010   CERVIX LESION DESTRUCTION  1988   CESAREAN SECTION     CHOLECYSTECTOMY  2003   COLONOSCOPY  08/2010   Dr. Collene Mares   COLONOSCOPY  02/2017   Dr. Collene Mares   ESOPHAGOGASTRODUODENOSCOPY  08/2010   Dr. Collene Mares   EXPLORATORY LAPAROTOMY     HERNIA REPAIR     MYOMECTOMY  1998   PELVIC LAPAROSCOPY     ROBOTIC ASSISTED LAPAROSCOPIC HYSTERECTOMY AND SALPINGECTOMY  10/2012   [facility]; laproscopic due  to fibroids   UMBILICAL HERNIA REPAIR  10/2013   infected laparoscopic port, mesh placement    FAMILY HISTORY: Family History  Problem Relation Age of Onset   Hypertension Mother    Dementia Mother    Colon cancer Mother 37   Deep vein thrombosis Father    Hypertension Father    Cancer Father        prostate CA   Heart disease Father 58       CAD   Parkinsonism Father    Hypertension Sister    Hyperlipidemia Sister    Hypertension Brother    Hyperlipidemia Brother    Hypertension Maternal Aunt    Hyperlipidemia Maternal Aunt    Rheum arthritis Maternal Aunt    Diabetes Maternal Aunt        1 with type II, 1 with type 1   Hypertension Brother    Rheum arthritis Brother    Diabetes Paternal Aunt        type II   Breast cancer Neg Hx     SOCIAL HISTORY: Social History   Socioeconomic History   Marital status: Divorced    Spouse name: Not on file   Number of children: 1   Years of education: college   Highest education level: Doctorate  Occupational History    Employer: [employer]    Comment: molecular Production designer, theatre/television/film, PhD candidate, [employer]  Social Needs   Financial resource strain: Not on file   Food insecurity    Worry: Not on file    Inability: Not on file    Transportation needs    Medical: Not on file    Non-medical: Not on file  Tobacco Use   Smoking status: Never Smoker   Smokeless tobacco: Never Used  Substance and Sexual Activity   Alcohol use: No   Drug use: No   Sexual activity: Not Currently    Partners: Male    Birth control/protection: Surgical    Comment: hysterectomy  Lifestyle   Physical activity    Days per week: Not on file    Minutes per session: Not on file   Stress: Not on file  Relationships   Social connections    Talks on phone: Not on file    Gets together: Not on file    Attends religious service: Not on file    Active member of club or organization: Not on file    Attends meetings of clubs or organizations: Not on file    Relationship status: Not on file   Intimate partner violence    Fear of current or ex partner: Not on file    Emotionally abused: Not on file    Physically abused: Not on file    Forced sexual activity: Not on file  Other Topics Concern   Not on file  Social History Narrative   Single, 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  Has 63 yo daughter.  Her siblings and her don't agree on helping take care of their parents, causes family tension.  As of 11/2014      Right-handed.   0.5 cup caffeine per day.   Her father lives with her.      PHYSICAL EXAM  Vitals:   05/22/19 1310  BP: (!) 142/89  Pulse: (!) 54  Temp: 97.7 F (  36.5 C)  Weight: 128 lb 9.6 oz (58.3 kg)  Height: 5\' 4"  (1.626 m)   Body mass index is 22.07 kg/m.  Generalized: Well developed, in no acute distress   Neurological examination  Mentation: Alert oriented to time, place, history taking. Follows all commands speech and language fluent Cranial nerve II-XII: Pupils were equal round reactive to light. Extraocular  movements were full, visual field were full on confrontational test. Facial sensation and strength were normal. Head turning and shoulder shrug  were normal and symmetric. Motor: The motor testing reveals 5 over 5 strength of all 4 extremities. Good symmetric motor tone is noted throughout.  Sensory: Sensory testing is intact to soft touch on all 4 extremities. No evidence of extinction is noted.  Coordination: Cerebellar testing reveals good finger-nose-finger and heel-to-shin bilaterally.  Gait and station: Gait is normal. Tandem gait was normal.  Reflexes: Deep tendon reflexes are symmetric and normal bilaterally.   DIAGNOSTIC DATA (LABS, IMAGING, TESTING) - I reviewed patient records, labs, notes, testing and imaging myself where available.  Lab Results  Component Value Date   WBC 4.5 03/19/2019   HGB 12.5 03/19/2019   HCT 35.9 03/19/2019   MCV 92.1 03/19/2019   PLT 174 03/19/2019      Component Value Date/Time   NA 142 03/19/2019 1516   NA 141 05/21/2018 1633   K 4.5 03/19/2019 1516   CL 107 03/19/2019 1516   CO2 28 03/19/2019 1516   GLUCOSE 88 03/19/2019 1516   BUN 15 03/19/2019 1516   BUN 12 05/21/2018 1633   CREATININE 0.86 03/19/2019 1516   CALCIUM 9.4 03/19/2019 1516   PROT 6.8 03/19/2019 1516   PROT 6.6 05/21/2018 1633   ALBUMIN 4.5 05/21/2018 1633   AST 26 03/19/2019 1516   ALT 25 03/19/2019 1516   ALKPHOS 55 05/21/2018 1633   BILITOT 0.4 03/19/2019 1516   BILITOT 0.4 05/21/2018 1633   GFRNONAA 75 03/19/2019 1516   GFRAA 87 03/19/2019 1516   Lab Results  Component Value Date   CHOL 169 05/21/2018   HDL 77 05/21/2018   LDLCALC 82 05/21/2018   TRIG 52 05/21/2018   CHOLHDL 2.2 05/21/2018   Lab Results  Component Value Date   HGBA1C 6.0 (H) 05/21/2018   Lab Results  Component Value Date   VITAMINB12 486 05/06/2016   Lab Results  Component Value Date   TSH 0.966 02/28/2017      ASSESSMENT AND PLAN 58 y.o. year old female  has a past medical  history of Abnormal Pap smear (1988), Anemia (11/2010), Anxiety, Gastric polyp, GERD (gastroesophageal reflux disease), H/O bone density study (12/2007), H/O hysterectomy for benign disease (5/14), History of echocardiogram (03/2010), HTN (hypertension) (03/2010), Hyperlipemia, Internal hemorrhoids, Lumbar degenerative disc disease, Migraine, Myalgia, Numbness and tingling, Spondylosis, cervical, Umbilical hernia, and Uterine fibroid. here with:  1.  Neck pain, radiating pain to right shoulder, right upper extremities  -MRI of cervical spine 04/18/2019 showed solid fusion at  C5-6, C6-7, degenerative changes at the rest of cervical levels. Copied Dr. Rhea Belton note 05/08/2019: Mild central stenosis at C3-4, but no evidence of cord compression, bulging disc at C4-5, with central canal stenosis, minimum cord compression, moderate right, mild left foraminal stenosis, C6-C7, mild canal stenosis, minimum bilateral foraminal narrowing. -Continue gabapentin 100 mg, 3 times a day as needed -Symptoms have improved maybe 60%, but continued pain to the back of neck, mostly on left side, radiates numbness down right arm, was having numbness to left leg previously,  but is no longer present -We will proceed with EMG nerve conduction evaluation with Dr. Krista Blue due to continued symptoms despite lifestyle modification, medication -Follow-up in 6 months or sooner if needed  I spent 15 minutes with the patient. 50% of this time was spent discussing her plan of care.  Butler Denmark, AGNP-C, DNP 05/22/2019, 1:15 PM [facility] [address], [place], [place] [address] [phone]

## 2019-05-22 ENCOUNTER — Encounter: Payer: Self-pay | Admitting: Neurology

## 2019-05-22 ENCOUNTER — Ambulatory Visit: Payer: BC Managed Care – PPO | Admitting: Neurology

## 2019-05-22 ENCOUNTER — Other Ambulatory Visit: Payer: Self-pay

## 2019-05-22 VITALS — BP 142/89 | HR 54 | Temp 97.7°F | Ht 64.0 in | Wt 128.6 lb

## 2019-05-22 DIAGNOSIS — R202 Paresthesia of skin: Secondary | ICD-10-CM

## 2019-05-22 NOTE — Patient Instructions (Signed)
I will order nerve conduction study to be done by Dr. Krista Blue.   I am glad you have improved.   Return in 6 months  Happy Thanksgiving :)

## 2019-05-24 ENCOUNTER — Other Ambulatory Visit: Payer: Self-pay | Admitting: Rheumatology

## 2019-05-24 DIAGNOSIS — M359 Systemic involvement of connective tissue, unspecified: Secondary | ICD-10-CM

## 2019-05-27 NOTE — Telephone Encounter (Signed)
Please notify patient that is overdue to update PLQ eye exam.  Ok to send in 30-day supply of PLQ until her eye exam is updated.

## 2019-05-27 NOTE — Telephone Encounter (Signed)
Notes recorded by [name], MD on 03/20/2019 at 3:22 PM EDT  Labs do not indicate a flare of autoimmune disease. Labs are stable.  Last PLQ RF 09/13/2018 Last visit 03/19/2019 Next Visit 06/19/2019

## 2019-05-27 NOTE — Progress Notes (Signed)
Virtual Visit via Video Note  I connected with Shari Lawrence on 05/28/19 at  2:45 PM EST by a video enabled telemedicine application and verified that I am speaking with the correct person using two identifiers.  Location: Patient: Home  Provider: Clinic  This service was conducted via virtual visit.  Both audio and visual tools were used.  The patient was located at home. I was located in my office.  Consent was obtained prior to the virtual visit and is aware of possible charges through their insurance for this visit.  The patient is an established patient.  Dr. Estanislado Pandy, MD conducted the virtual visit and Hazel Sams, PA-C acted as scribe during the service.  Office staff helped with scheduling follow up visits after the service was conducted.   I discussed the limitations of evaluation and management by telemedicine and the availability of in person appointments. The patient expressed understanding and agreed to proceed.   CC: Left knee joint pain  History of Present Illness: Patient is a 58 year old female with a past medical history of autoimmune disease and myofascial pain syndrome. She is taking Plaquenil 200 mg 1 tablet by mouth twice daily M-F.  She denies any recent autoimmune flares.  She denies any recent rashes.  She denies any oral or nasal ulcerations.  She denies any sicca symptoms.  She has chronic fatigue but denies fevers or swollen lymph nodes.  She is having constant pain in the left knee joint.  She is having difficulty walking due to the discomfort she experiences. She denies any joint swelling.  She denies any mechanical symptoms.  She requested a left knee joint cortisone injection. She states she was evaluated by a neurologist for paresthesias in both arms.  She had a MRI of the C-spine on 04/18/19.  She is taking gabapentin as prescribed. She has been having worsening right elbow joint pain as well.   Review of Systems  Constitutional: Positive for malaise/fatigue.  Negative for fever.  HENT:       Denies any oral or nasal ulcerations  Denies sicca symptoms   Eyes: Negative for photophobia, pain, discharge and redness.  Respiratory: Negative for cough, shortness of breath and wheezing.   Cardiovascular: Negative for chest pain and palpitations.  Gastrointestinal: Negative for blood in stool, constipation and diarrhea.  Genitourinary: Negative for dysuria.  Musculoskeletal: Positive for back pain, joint pain, myalgias and neck pain.  Skin: Negative for rash.  Neurological: Negative for dizziness and headaches.  Endo/Heme/Allergies:       Denies enlarged lymph nodes  Psychiatric/Behavioral: Negative for depression. The patient is not nervous/anxious and does not have insomnia.       Observations/Objective: Physical Exam  Constitutional: She is oriented to person, place, and time and well-developed, well-nourished, and in no distress.  HENT:  Head: Normocephalic and atraumatic.  Eyes: Conjunctivae are normal.  Pulmonary/Chest: Effort normal.  Neurological: She is alert and oriented to person, place, and time.  Psychiatric: Mood, memory, affect and judgment normal.   Patient reports morning stiffness for several  hours.   Patient reports nocturnal pain.  Difficulty dressing/grooming: Denies Difficulty climbing stairs: Reports Difficulty getting out of chair: Reports Difficulty using hands for taps, buttons, cutlery, and/or writing: Denies   Assessment and Plan: Visit Diagnoses: Autoimmune disease ([place]) - ANA 1:320 NO, ds8, RF-,CCP-,History of nasal ulcers and oral ulcers in the past, positive synovitis in left hand MCPs on [place] previously: She has not had any signs or symptoms of  a flare recently. She is clinically doing well on Plaquenil 200 mg 1 tablet by mouth daily M-F.  Complements were WNL, dsDNA was 9, and sed rate WNL on 03/19/19.  She has not had any recent rashes, hair loss, photosensitivity, oral or nasal ulcerations, sicca symptoms,  fevers, or enlarged lymph nodes.  She is having increased left knee joint pain but no joint swelling. She has ongoing neck pain and is undergoing a thorough work up by Dr. Krista Blue.  She has not had any shortness of breath, chest pain, or palpitations recently.  She will continue taking PLQ as prescribed.  We will update autoimmune lab work in February 2021.  She was advised to notify us if she develops any new or worsening symptoms.  She will follow up in 5 months.     High risk medication use - PLQ 200 mg 1 tablet by mouth daily M-F only.Eye Exam: 03/09/18.  She is scheduled for a PLQ eye exam on 05/21/19.  CBC and CMP were WNL on 03/19/19.  She will be due to update lab work in February and every 5 months to monitor for drug toxicity.   Primary osteoarthritis of both hands-She has mild osteoarthritis.  She has no discomfort or joint swelling at this time.  She has complete fist formation bilaterally.  Joint protection and muscle strengthening were discussed.   Primary osteoarthritis of both knees-Chronic pain. She is having increased pain in the left knee joint. X-rays of the left knee joint were obtained on 08/30/16, which revealed mild to moderate OA. She reports the pain has been constant.  She is not having any mechanical symptoms.  She would like to schedule a cortisone injection urgently, so we will refer her to Dr. Junius Roads.   Chronic pain of left knee: She is having increased constant pain in the left knee joint.  She has not noticed any joint swelling or mechanical symptoms.  She has not had a recent injury.  She would like to schedule a cortisone injection.  We are strictly doing virtual visits at this time, so we will refer her to Dr. Junius Roads for further evaluation and treatment since she is in so much discomfort.   Pain in joint of right elbow: She has been experiencing right elbow joint pain recently.  We are unsure if it is due to lateral epicondylitis or radiating from her C-spine.  She will be  referred to Dr. Junius Roads for further evaluation.   Myofascial muscle pain--She has generalized muscle aches and muscle tenderness due to myofascial pain syndrome.    Paresthesia of arm-She was evaluated Dr. Krista Blue.  She had a MRI of the C-spine on 04/18/19: fusion of C5-C6 and C6-C7 and degenerative changes.  She has mild central stenosis at C3-C4.  She is taking gabapentin as prescribed.  She is scheduled for a NCV with EMG.  She will continue to follow up with Dr. Krista Blue.    Paresthesia of left leg-She was referred to neurologist for further evaluation.  She is scheduled for a NCV with EMG on 05/22/19.   Other medical conditions are listed as followsL   Family history of lupus erythematosus  Family history of rheumatoid arthritis  History of anxiety  History of hyperlipidemia  History of gastroesophageal reflux (GERD)  History of hypertension-  Follow Up Instructions: She will follow up in 5 months.    I discussed the assessment and treatment plan with the patient. The patient was provided an opportunity to ask questions and all  were answered. The patient agreed with the plan and demonstrated an understanding of the instructions.   The patient was advised to call back or seek an in-person evaluation if the symptoms worsen or if the condition fails to improve as anticipated.  I provided 25 minutes of non-face-to-face time during this encounter.   [name], MD   Scribed by-  Hazel Sams, PA-C

## 2019-05-28 ENCOUNTER — Telehealth (INDEPENDENT_AMBULATORY_CARE_PROVIDER_SITE_OTHER): Payer: BC Managed Care – PPO | Admitting: Rheumatology

## 2019-05-28 ENCOUNTER — Other Ambulatory Visit: Payer: Self-pay

## 2019-05-28 ENCOUNTER — Encounter: Payer: Self-pay | Admitting: Rheumatology

## 2019-05-28 DIAGNOSIS — M170 Bilateral primary osteoarthritis of knee: Secondary | ICD-10-CM

## 2019-05-28 DIAGNOSIS — M25521 Pain in right elbow: Secondary | ICD-10-CM

## 2019-05-28 DIAGNOSIS — M7918 Myalgia, other site: Secondary | ICD-10-CM

## 2019-05-28 DIAGNOSIS — Z79899 Other long term (current) drug therapy: Secondary | ICD-10-CM | POA: Diagnosis not present

## 2019-05-28 DIAGNOSIS — M359 Systemic involvement of connective tissue, unspecified: Secondary | ICD-10-CM | POA: Diagnosis not present

## 2019-05-28 DIAGNOSIS — Z8679 Personal history of other diseases of the circulatory system: Secondary | ICD-10-CM

## 2019-05-28 DIAGNOSIS — M19041 Primary osteoarthritis, right hand: Secondary | ICD-10-CM

## 2019-05-28 DIAGNOSIS — Z8719 Personal history of other diseases of the digestive system: Secondary | ICD-10-CM

## 2019-05-28 DIAGNOSIS — Z8659 Personal history of other mental and behavioral disorders: Secondary | ICD-10-CM

## 2019-05-28 DIAGNOSIS — M25562 Pain in left knee: Secondary | ICD-10-CM

## 2019-05-28 DIAGNOSIS — M19042 Primary osteoarthritis, left hand: Secondary | ICD-10-CM

## 2019-05-28 DIAGNOSIS — Z840 Family history of diseases of the skin and subcutaneous tissue: Secondary | ICD-10-CM

## 2019-05-28 DIAGNOSIS — R202 Paresthesia of skin: Secondary | ICD-10-CM

## 2019-05-28 DIAGNOSIS — Z8261 Family history of arthritis: Secondary | ICD-10-CM

## 2019-05-28 DIAGNOSIS — Z8639 Personal history of other endocrine, nutritional and metabolic disease: Secondary | ICD-10-CM

## 2019-05-28 DIAGNOSIS — G8929 Other chronic pain: Secondary | ICD-10-CM

## 2019-05-28 NOTE — Telephone Encounter (Signed)
Please contact patient. Thank you. 

## 2019-05-28 NOTE — Telephone Encounter (Signed)
Patient has a virtual visit this afternoon and will be informed of needing updated PLQ eye exam. 30 day supply refilled.

## 2019-05-29 NOTE — Addendum Note (Signed)
Addended by: Earnestine Mealing on: 05/29/2019 09:10 AM   Modules accepted: Orders

## 2019-05-29 NOTE — Progress Notes (Signed)
I have reviewed and agreed above plan. 

## 2019-06-04 NOTE — Progress Notes (Signed)
58 y.o. [id number] Divorced Black or [place] American Not Hispanic or Latino female here for annual exam.  H/O hysterectomy, on 0.0375 mg estradiol patch.  Just occasional hot flashes, not ready to decrease her dose.  She has an autoimmune disorder and has chronic pain in her lower abdomen to her feet.  She has a bulging disc at C2. Going to PT.   Patient's last menstrual period was 10/25/2012 (approximate).          Sexually active: No.  The current method of family planning is status post hysterectomy.    Exercising: Yes.    stretching, job is strenuous with caring for father Smoker:  no  Health Maintenance: Pap:04/20/2017 WNL NEG HR HPV History of abnormal Pap:Yes- 1988, s/p cryo. MMG:06/11/2018 Birads 1 negative Colonoscopy:Normal colonoscopy in 8/17. Doing them every 5 years. BMD:12-31-14 low bone mass TDaP:02-04-12 Gardasil:N/A   reports that she has never smoked. She has never used smokeless tobacco. She reports that she does not drink alcohol or use drugs. 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 She takes care of her Dad (lives with her). Daughter is also living with her, works as a Education officer, museum.   Past Medical History:  Diagnosis Date  . Abnormal Pap smear 1988   treated with cryo  . Anemia 11/2010   hematology consult prior; etiology malabsorption and uterine bleeding  . Anxiety   . Gastric polyp   . GERD (gastroesophageal reflux disease)   . H/O bone density study 12/2007  . H/O hysterectomy for benign disease 5/14  . History of echocardiogram 03/2010   normal LV function, EF 60-65%, mild left atrial enlargement  . HTN (hypertension) 03/2010   hospitalization for HTN urgency  . Hyperlipemia   . Internal hemorrhoids   . Lumbar degenerative disc disease   . Migraine   . Myalgia   . Numbness and tingling   . Spondylosis, cervical   . Umbilical hernia   . Uterine fibroid    hx/o     Past Surgical History:  Procedure Laterality Date  . CERVICAL FUSION  2010  .  CERVIX LESION DESTRUCTION  1988  . CESAREAN SECTION    . CHOLECYSTECTOMY  2003  . COLONOSCOPY  08/2010   Dr. Collene Mares  . COLONOSCOPY  02/2017   Dr. Collene Mares  . ESOPHAGOGASTRODUODENOSCOPY  08/2010   Dr. Collene Mares  . EXPLORATORY LAPAROTOMY    . HERNIA REPAIR    . MYOMECTOMY  1998  . PELVIC LAPAROSCOPY    . ROBOTIC ASSISTED LAPAROSCOPIC HYSTERECTOMY AND SALPINGECTOMY  10/2012   [facility]; laproscopic due to fibroids  . UMBILICAL HERNIA REPAIR  10/2013   infected laparoscopic port, mesh placement    Current Outpatient Medications  Medication Sig Dispense Refill  . acetaminophen (TYLENOL) 500 MG tablet Take 1 tablet (500 mg total) by mouth every 6 (six) hours as needed. (Patient taking differently: Take 500 mg by mouth every 6 (six) hours as needed for mild pain. ) 30 tablet 0  . ALPRAZolam (XANAX) 0.5 MG tablet Take 1 tablet (0.5 mg total) by mouth 2 (two) times daily as needed for anxiety. 45 tablet 1  . Ascorbic Acid (VITAMIN C) 1000 MG tablet Take 1,000 mg by mouth daily.     Marland Kitchen atorvastatin (LIPITOR) 20 MG tablet Take 1 tablet (20 mg total) by mouth daily. 90 tablet 3  . B Complex Vitamins (B COMPLEX PO) Take 1 tablet by mouth daily.     . calcium carbonate (OS-CAL) 600 MG TABS Take 600 mg  by mouth daily.     Marland Kitchen CINNAMON PO Take by mouth.    . diclofenac sodium (VOLTAREN) 1 % GEL 3 grams to 3 large joints up to 3 times daily 3 Tube 3  . estradiol (VIVELLE-DOT) 0.0375 MG/24HR Place 1 patch onto the skin 2 (two) times a week. 24 patch 3  . fish oil-omega-3 fatty acids 1000 MG capsule Take 2 g by mouth daily.    . hydroxychloroquine (PLAQUENIL) 200 MG tablet TAKE 1 TABLET BY MOUTH TWICE DAILY MONDAY THROUGH FRIDAY 40 tablet 0  . losartan (COZAAR) 25 MG tablet TAKE 1 TABLET BY MOUTH EVERY DAY 90 tablet 1  . Multiple Vitamin (MULTIVITAMIN) tablet Take 1 tablet by mouth daily.     Marland Kitchen NEXIUM 40 MG capsule Take 40 mg by mouth daily.     . Probiotic Product (PROBIOTIC ADVANCED PO) Take 1 tablet by mouth daily.      . TURMERIC PO Take 1 tablet by mouth daily.     Marland Kitchen gabapentin (NEURONTIN) 100 MG capsule Take 1 capsule (100 mg total) by mouth 3 (three) times daily. (Patient not taking: Reported on 06/06/2019) 90 capsule 11  . methocarbamol (ROBAXIN) 500 MG tablet Take 1 tablet (500 mg total) by mouth at bedtime as needed for muscle spasms. (Patient not taking: Reported on 05/22/2019) 30 tablet 0   No current facility-administered medications for this visit.    Family History  Problem Relation Age of Onset  . Hypertension Mother   . Dementia Mother   . Colon cancer Mother 34  . Deep vein thrombosis Father   . Hypertension Father   . Cancer Father        prostate CA  . Heart disease Father 44       CAD  . Parkinsonism Father   . Hypertension Sister   . Hyperlipidemia Sister   . Hypertension Brother   . Hyperlipidemia Brother   . Hypertension Maternal Aunt   . Hyperlipidemia Maternal Aunt   . Rheum arthritis Maternal Aunt   . Diabetes Maternal Aunt        1 with type II, 1 with type 1  . Hypertension Brother   . Rheum arthritis Brother   . Diabetes Paternal Aunt        type II  . Breast cancer Neg Hx     Review of Systems  Constitutional: Negative.   HENT: Negative.   Eyes: Negative.   Respiratory: Negative.   Cardiovascular: Negative.   Gastrointestinal: Negative.   Endocrine: Negative.   Genitourinary: Negative.   Musculoskeletal: Negative.   Skin: Negative.   Allergic/Immunologic: Negative.   Neurological: Negative.   Hematological: Negative.   Psychiatric/Behavioral: Negative.     Exam:   BP (!) 158/92 (BP Location: Right Arm, Patient Position: Sitting, Cuff Size: Normal)   Pulse 60   Temp (!) 97.2 F (36.2 C) (Skin)   Ht 5\' 4"  (1.626 m)   Wt 126 lb 9.6 oz (57.4 kg)   LMP 10/25/2012 (Approximate)   BMI 21.73 kg/m   Weight change: @WEIGHTCHANGE @ Height:   Height: 5\' 4"  (162.6 cm)  Ht Readings from Last 3 Encounters:  06/06/19 5\' 4"  (1.626 m)  05/22/19 5\' 4"  (1.626  m)  04/08/19 5\' 4"  (1.626 m)    General appearance: alert, cooperative and appears stated age Head: Normocephalic, without obvious abnormality, atraumatic Neck: no adenopathy, supple, symmetrical, trachea midline and thyroid normal to inspection and palpation Lungs: clear to auscultation bilaterally Cardiovascular: regular rate and rhythm  Breasts: normal appearance, no masses or tenderness Abdomen: soft, non-tender; non distended,  no masses,  no organomegaly Extremities: extremities normal, atraumatic, no cyanosis or edema Skin: Skin color, texture, turgor normal. No rashes or lesions Lymph nodes: Cervical, supraclavicular, and axillary nodes normal. No abnormal inguinal nodes palpated Neurologic: Grossly normal   Pelvic: External genitalia:  no lesions              Urethra:  normal appearing urethra with no masses, tenderness or lesions              Bartholins and Skenes: normal                 Vagina: normal appearing vagina with normal color and discharge, no lesions              Cervix: absent               Bimanual Exam:  Uterus:  uterus absent              Adnexa: no mass, fullness, tenderness               Rectovaginal: Confirms               Anus:  normal sphincter tone, no lesions  Chaperone was present for exam.  A:  Well Woman with normal exam  H/O hysterectomy  Vasomotor symptoms controlled on ERT  She will discuss ERT with her Rheumatologist  P:   Mammogram next month  DEXA in 7/21  Discussed breast self exam  Discussed calcium and vit D intake

## 2019-06-05 ENCOUNTER — Other Ambulatory Visit: Payer: Self-pay

## 2019-06-05 ENCOUNTER — Encounter: Payer: Self-pay | Admitting: Family Medicine

## 2019-06-05 ENCOUNTER — Ambulatory Visit: Payer: BC Managed Care – PPO | Admitting: Family Medicine

## 2019-06-05 DIAGNOSIS — M25521 Pain in right elbow: Secondary | ICD-10-CM | POA: Diagnosis not present

## 2019-06-05 DIAGNOSIS — G8929 Other chronic pain: Secondary | ICD-10-CM

## 2019-06-05 DIAGNOSIS — M25562 Pain in left knee: Secondary | ICD-10-CM

## 2019-06-05 NOTE — Patient Instructions (Signed)
    Voltaren Gel:  4 times daily as needed.

## 2019-06-05 NOTE — Progress Notes (Signed)
Office Visit Note   Patient: Shari Lawrence           Date of Birth: Feb 14, 1961           MRN: [id number] Visit Date: 06/05/2019 Requested by: [name], MD [id number] [place],  [place] [address] PCP: Caryl Ada  Subjective: Chief Complaint  Patient presents with  . Left Knee - Pain  . Right Elbow - Pain    HPI: She is here with multiple areas of pain but primarily her right elbow and left knee.  She has a history of osteoarthritis in the knees based on x-rays from 2018, primarily in the patellofemoral joint.  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  It does not lock or give way.  Her elbow hurts when lifting things.  Pain is localized to the anterior lateral proximal forearm.  Denies any numbness or tingling in her hand related to this.  She also struggles with a cervical disc herniation.  She has pain in both hips as well.                ROS: No fevers or chills.  All other systems were reviewed and are negative.  Objective: Vital Signs: LMP 10/25/2012 (Approximate)   Physical Exam:  General:  Alert and oriented, in no acute distress. Pulm:  Breathing unlabored. Psy:  Normal mood, congruent affect. Skin: No rash. Right elbow: Full flexion, extension, and full forearm pronation and supination.  No elbow effusion or warmth.  No tenderness at the common extensor tendon.  No tenderness at the radial tunnel.  She is tender primarily at the muscle belly.  Resisted strength testing causes some pain with forearm pronation. Left knee: 2+ patellofemoral crepitus, trace effusion with no warmth.  No significant pain with patella compression.  She is tender primarily on the medial joint line and has pain but no palpable click with McMurray's.  Imaging: None today.  Assessment & Plan: 1.  Right elbow pain, probably myofascial pain -She wants  to try Voltaren gel.  Referral given to integrative therapies.  2.  Left knee pain with patellofemoral DJD, cannot rule out medial meniscus tear -She was initially referred for cortisone injection but she is reluctant to do that, she wants to try Voltaren gel and physical therapy first.  She can call me at any time if she wants to come in for an injection.     Procedures: No procedures performed  No notes on file     PMFS History: Patient Active Problem List   Diagnosis Date Noted  . Paresthesia 04/08/2019  . Neck pain 03/30/2019  . Radicular pain in right arm 03/30/2019  . Numbness of leg 03/30/2019  . Acute stress reaction 07/24/2018  . Chest pain 07/24/2018  . Hypotension 07/17/2018  . Vertigo 07/17/2018  . Nausea 07/17/2018  . Vaccine counseling 05/21/2018  . Need for influenza vaccination 05/21/2018  . Routine general medical examination at a health care facility 05/21/2018  . Caregiver burden 07/05/2017  . Cyst of skin 07/05/2017  . Transient elevated blood pressure 06/28/2017  . Leg pain, diffuse, left 06/28/2017  . Leg swelling 06/28/2017  . Bruising 06/28/2017  . Myofascial muscle pain 06/28/2017  . High risk medication use 03/30/2017  . Chronic constipation 11/17/2016  . Globus sensation 11/17/2016  . Primary osteoarthritis of both knees 10/05/2016  .  Autoimmune disease ([place]) 09/29/2016  . Primary osteoarthritis of both hands 09/29/2016  . Family history of lupus erythematosus 09/20/2016  . Polyarthralgia 05/06/2016  . Clenching of teeth 05/06/2016  . Finger swelling 05/06/2016  . Morning stiffness of joints 05/06/2016  . Myalgia 05/06/2016  . Family history of rheumatoid arthritis 05/06/2016  . Essential hypertension 12/23/2014  . History of anemia 12/23/2014  . Care provider for parents 12/23/2014  . Post-operative state 11/13/2012  . Hyperlipidemia 11/11/2012  . Hypertension 11/11/2012  . Impaired fasting glucose 10/25/2011  . Gluten intolerance  10/25/2011  . Gastroesophageal reflux disease without esophagitis 10/25/2011  . Anxiety state   . Iron deficiency anemia    Past Medical History:  Diagnosis Date  . Abnormal Pap smear 1988   treated with cryo  . Anemia 11/2010   hematology consult prior; etiology malabsorption and uterine bleeding  . Anxiety   . Gastric polyp   . GERD (gastroesophageal reflux disease)   . H/O bone density study 12/2007  . H/O hysterectomy for benign disease 5/14  . History of echocardiogram 03/2010   normal LV function, EF 60-65%, mild left atrial enlargement  . HTN (hypertension) 03/2010   hospitalization for HTN urgency  . Hyperlipemia   . Internal hemorrhoids   . Lumbar degenerative disc disease   . Migraine   . Myalgia   . Numbness and tingling   . Spondylosis, cervical   . Umbilical hernia   . Uterine fibroid    hx/o     Family History  Problem Relation Age of Onset  . Hypertension Mother   . Dementia Mother   . Colon cancer Mother 2  . Deep vein thrombosis Father   . Hypertension Father   . Cancer Father        prostate CA  . Heart disease Father 29       CAD  . Parkinsonism Father   . Hypertension Sister   . Hyperlipidemia Sister   . Hypertension Brother   . Hyperlipidemia Brother   . Hypertension Maternal Aunt   . Hyperlipidemia Maternal Aunt   . Rheum arthritis Maternal Aunt   . Diabetes Maternal Aunt        1 with type II, 1 with type 1  . Hypertension Brother   . Rheum arthritis Brother   . Diabetes Paternal Aunt        type II  . Breast cancer Neg Hx     Past Surgical History:  Procedure Laterality Date  . CERVICAL FUSION  2010  . CERVIX LESION DESTRUCTION  1988  . CESAREAN SECTION    . CHOLECYSTECTOMY  2003  . COLONOSCOPY  08/2010   Dr. Collene Mares  . COLONOSCOPY  02/2017   Dr. Collene Mares  . ESOPHAGOGASTRODUODENOSCOPY  08/2010   Dr. Collene Mares  . EXPLORATORY LAPAROTOMY    . HERNIA REPAIR    . MYOMECTOMY  1998  . PELVIC LAPAROSCOPY    . ROBOTIC ASSISTED LAPAROSCOPIC  HYSTERECTOMY AND SALPINGECTOMY  10/2012   [facility]; laproscopic due to fibroids  . UMBILICAL HERNIA REPAIR  10/2013   infected laparoscopic port, mesh placement   Social History   Occupational History    Employer: [employer]    Comment: molecular Production designer, theatre/television/film, PhD candidate, [employer]  Tobacco Use  . Smoking status: Never Smoker  . Smokeless tobacco: Never Used  Substance and Sexual Activity  . Alcohol use: No  . Drug use: No  . Sexual activity: Not Currently    Partners:  Male    Birth control/protection: Surgical    Comment: hysterectomy

## 2019-06-06 ENCOUNTER — Encounter: Payer: Self-pay | Admitting: Obstetrics and Gynecology

## 2019-06-06 ENCOUNTER — Ambulatory Visit: Payer: BC Managed Care – PPO | Admitting: Obstetrics and Gynecology

## 2019-06-06 VITALS — BP 158/92 | HR 60 | Temp 97.2°F | Ht 64.0 in | Wt 126.6 lb

## 2019-06-06 DIAGNOSIS — Z01419 Encounter for gynecological examination (general) (routine) without abnormal findings: Secondary | ICD-10-CM | POA: Diagnosis not present

## 2019-06-06 DIAGNOSIS — Z7189 Other specified counseling: Secondary | ICD-10-CM | POA: Diagnosis not present

## 2019-06-06 DIAGNOSIS — M8580 Other specified disorders of bone density and structure, unspecified site: Secondary | ICD-10-CM

## 2019-06-06 DIAGNOSIS — R232 Flushing: Secondary | ICD-10-CM | POA: Diagnosis not present

## 2019-06-06 DIAGNOSIS — Z780 Asymptomatic menopausal state: Secondary | ICD-10-CM

## 2019-06-06 MED ORDER — ESTRADIOL 0.0375 MG/24HR TD PTTW: 1.0000 | MEDICATED_PATCH | TRANSDERMAL | 3 refills | Status: DC

## 2019-06-06 NOTE — Patient Instructions (Signed)

## 2019-06-10 ENCOUNTER — Other Ambulatory Visit: Payer: Self-pay

## 2019-06-10 DIAGNOSIS — Z20822 Contact with and (suspected) exposure to covid-19: Secondary | ICD-10-CM

## 2019-06-12 LAB — NOVEL CORONAVIRUS, NAA: SARS-CoV-2, NAA: NOT DETECTED

## 2019-06-13 ENCOUNTER — Telehealth: Payer: Self-pay

## 2019-06-13 NOTE — Telephone Encounter (Signed)
Returned call to pt.  Reported she had direct exposure to someone with known positive COVID.   Reported that this was a Caregiver for her father, that had been in her home for extended periods, while she had been symptomatic, and subsequently tested positive; exposure was 8 days ago.  Stated she is more concerned about her father, that has a lot of health problems and is elderly.  Stated she is following the safe practice guidelines with masking, freq. Hand washing, and disinfecting common touch surfaces in her home.  Denied having any symptoms and recently tested negative for COVID, herself.  Encouraged to complete 14 day quarantine period, and to monitor for any symptoms of COVID; fever, cough, shortness of breath, body aches, and any flu-like symptoms.  Advised to monitor her father for same, and to contact his PCP, if he develops symptoms.  Pt. Verb. Understanding.  All questions answered.

## 2019-06-14 ENCOUNTER — Encounter: Payer: Self-pay | Admitting: Medical

## 2019-06-14 ENCOUNTER — Other Ambulatory Visit: Payer: Self-pay

## 2019-06-14 ENCOUNTER — Other Ambulatory Visit: Payer: Self-pay | Admitting: Medical

## 2019-06-14 ENCOUNTER — Ambulatory Visit: Payer: BC Managed Care – PPO | Admitting: Medical

## 2019-06-14 VITALS — Temp 98.4°F | Ht 64.0 in | Wt 125.0 lb

## 2019-06-14 DIAGNOSIS — Z20822 Contact with and (suspected) exposure to covid-19: Secondary | ICD-10-CM | POA: Insufficient documentation

## 2019-06-14 DIAGNOSIS — G8929 Other chronic pain: Secondary | ICD-10-CM

## 2019-06-14 DIAGNOSIS — M25561 Pain in right knee: Secondary | ICD-10-CM | POA: Diagnosis not present

## 2019-06-14 DIAGNOSIS — M79601 Pain in right arm: Secondary | ICD-10-CM

## 2019-06-14 DIAGNOSIS — M25562 Pain in left knee: Secondary | ICD-10-CM

## 2019-06-14 DIAGNOSIS — Z20828 Contact with and (suspected) exposure to other viral communicable diseases: Secondary | ICD-10-CM | POA: Diagnosis not present

## 2019-06-14 DIAGNOSIS — F419 Anxiety disorder, unspecified: Secondary | ICD-10-CM | POA: Diagnosis not present

## 2019-06-14 MED ORDER — ALPRAZOLAM 0.5 MG PO TABS: 0.5000 mg | ORAL_TABLET | Freq: Two times a day (BID) | ORAL | 1 refills | Status: DC | PRN

## 2019-06-14 NOTE — Progress Notes (Signed)
Subjective:     Patient ID: Shari Lawrence, female   DOB: 03-21-1961, 58 y.o.   MRN: [id number]  This visit type was conducted due to national recommendations for restrictions regarding the COVID-19 Pandemic (e.g. social distancing) in an effort to limit this patient's exposure and mitigate transmission in our community.  Due to their co-morbid illnesses, this patient is at least at moderate risk for complications without adequate follow up.  This format is felt to be most appropriate for this patient at this time.    Documentation for virtual audio and video telecommunications through Zoom encounter:  The patient was located at home. The provider was located in the office. The patient did consent to this visit and is aware of possible charges through their insurance for this visit.  The other persons participating in this telemedicine service were none. Time spent on call was 20 minutes and in review of previous records 20 minutes total.  This virtual service is not related to other E/M service within previous 7 days.   HPI Chief Complaint  Patient presents with  . Follow-up    -covid-discuss test   . Knee Pain    bilateral worse on left side   . Elbow Pain    bilateral, left is worse    Quarantined currently because father's CNA came to work sick.  Thought she had a cold.   She sent her home, but probably had 2 days of exposure although everyone were wearing masks.   She is on day 8 with no symptoms.  Her father is also asymptomatic.  She is his caregiver.  He is elderly.  Her daughter lives at the house as well.  Everyone in the house otherwise tested negative, and they had their test about 4 days after exposure.  She receives all rheumatology, and subsequently saw neurology and orthopedist, who in turn sent her to integrative therapy.  She made the initial consult and will be going back for physical therapy and other therapy.   She has ongoing bilateral knee pain, no swelling no  trauma no injury.  She feels nodules in her knee.  Lately in the last couple days she has had some right arm pain.  She has intermittent low back pain for years, sometimes lately has had more buttock pain and tingling in the legs.  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   Past Medical History:  Diagnosis Date  . Abnormal Pap smear 1988   treated with cryo  . Anemia 11/2010   hematology consult prior; etiology malabsorption and uterine bleeding  . Anxiety   . Gastric polyp   . GERD (gastroesophageal reflux disease)   . H/O bone density study 12/2007  . H/O hysterectomy for benign disease 5/14  . History of echocardiogram 03/2010   normal LV function, EF 60-65%, mild left atrial enlargement  . HTN (hypertension) 03/2010   hospitalization for HTN urgency  . Hyperlipemia   . Internal hemorrhoids   . Lumbar degenerative disc disease   . Migraine   . Myalgia   . Numbness and tingling   . Spondylosis, cervical   . Umbilical hernia   . Uterine fibroid    hx/o    Current Outpatient Medications on File Prior to Visit  Medication Sig Dispense  Refill  . acetaminophen (TYLENOL) 500 MG tablet Take 1 tablet (500 mg total) by mouth every 6 (six) hours as needed. (Patient taking differently: Take 500 mg by mouth every 6 (six) hours as needed for mild pain. ) 30 tablet 0  . Ascorbic Acid (VITAMIN C) 1000 MG tablet Take 1,000 mg by mouth daily.     . B Complex Vitamins (B COMPLEX PO) Take 1 tablet by mouth daily.     . calcium carbonate (OS-CAL) 600 MG TABS Take 600 mg by mouth daily.     Marland Kitchen CINNAMON PO Take by mouth.    . diclofenac sodium (VOLTAREN) 1 % GEL 3 grams to 3 large joints up to 3 times daily 3 Tube 3  . estradiol (VIVELLE-DOT) 0.0375 MG/24HR Place 1 patch onto the skin 2 (two) times a week.  24 patch 3  . fish oil-omega-3 fatty acids 1000 MG capsule Take 2 g by mouth daily.    . hydroxychloroquine (PLAQUENIL) 200 MG tablet TAKE 1 TABLET BY MOUTH TWICE DAILY MONDAY THROUGH FRIDAY 40 tablet 0  . losartan (COZAAR) 25 MG tablet TAKE 1 TABLET BY MOUTH EVERY DAY 90 tablet 1  . Multiple Vitamin (MULTIVITAMIN) tablet Take 1 tablet by mouth daily.     Marland Kitchen NEXIUM 40 MG capsule Take 40 mg by mouth daily.     . Probiotic Product (PROBIOTIC ADVANCED PO) Take 1 tablet by mouth daily.     . TURMERIC PO Take 1 tablet by mouth daily.     Marland Kitchen gabapentin (NEURONTIN) 100 MG capsule Take 1 capsule (100 mg total) by mouth 3 (three) times daily. (Patient not taking: Reported on 06/06/2019) 90 capsule 11  . methocarbamol (ROBAXIN) 500 MG tablet Take 1 tablet (500 mg total) by mouth at bedtime as needed for muscle spasms. (Patient not taking: Reported on 06/14/2019) 30 tablet 0   No current facility-administered medications on file prior to visit.    Review of Systems As in subjective    Objective:   Physical Exam Due to coronavirus pandemic stay at home measures, patient visit was virtual and they were not examined in person.   Temp 98.4 F (36.9 C)   Ht 5\' 4"  (1.626 m)   Wt 125 lb (56.7 kg)   LMP 10/25/2012 (Approximate)   BMI 21.46 kg/m   145/86       Assessment:     Encounter Diagnoses  Name Primary?  . Chronic pain of both knees Yes  . Right arm pain   . Anxiety   . Close exposure to COVID-19 virus        Plan:     We discussed her symptoms and concerns.  I think overall she needed reassurance today and to discuss her recent visits with orthopedics.  She has been referred to Integrative therapy for physical therapy and other therapy options  Covid exposure-she is day 8 from Covid exposure asymptomatic with a negative Covid test 4 days after exposure.  Likewise her father is also asymptomatic negative Covid test 4 days after exposure.  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   The nurse aide come into the house with sick symptoms that ultimately tested positive for Covid  We discussed continue to quarantine and begin optimistic that they have been lucky not to catch the virus  Continue practicing safe measures with social distancing, masking, limiting people coming into the house  Bilateral knee pain-I reviewed the orthopedic notes where she was seen recently Dr. Junius Roads.  I recommend she continue plan to see integrative therapy for physical therapy another potential therapy.  We discussed old x-ray records in the chart of the knees from years ago  Right arm pain-orthopedics felt like this was myofascial pain.  Continue Voltaren gel, continue plan to see physical therapy  Low back pain and radicular leg pain-advise she take more frequent breaks from sitting for prolonged periods.  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  We discussed stretching, and letting physical therapy also work with her to give her improvements on her symptoms  Adaijah was seen today for follow-up, knee pain and elbow pain.  Diagnoses and all orders for this visit:  Chronic pain of both knees  Right arm pain  Anxiety  Close exposure to COVID-19 virus  Other orders -     ALPRAZolam (XANAX) 0.5 MG tablet; Take 1 tablet (0.5 mg total) by mouth 2 (two) times daily as needed for anxiety.

## 2019-06-19 ENCOUNTER — Ambulatory Visit: Payer: BC Managed Care – PPO | Admitting: Rheumatology

## 2019-07-11 ENCOUNTER — Telehealth: Payer: Self-pay | Admitting: Rheumatology

## 2019-07-11 NOTE — Telephone Encounter (Signed)
Patient called to let Dr. Estanislado Pandy know that she is scheduled to see Dr. Junius Roads tomorrow 07/12/19.  Patient states she has been experiencing fatigue with joint pain in her shoulders, hands, wrists, and fingers.

## 2019-07-12 ENCOUNTER — Encounter: Payer: Self-pay | Admitting: Family Medicine

## 2019-07-12 ENCOUNTER — Other Ambulatory Visit: Payer: Self-pay

## 2019-07-12 ENCOUNTER — Ambulatory Visit (INDEPENDENT_AMBULATORY_CARE_PROVIDER_SITE_OTHER): Payer: BC Managed Care – PPO | Admitting: Family Medicine

## 2019-07-12 DIAGNOSIS — M2550 Pain in unspecified joint: Secondary | ICD-10-CM

## 2019-07-12 DIAGNOSIS — R5383 Other fatigue: Secondary | ICD-10-CM

## 2019-07-12 MED ORDER — MELOXICAM 15 MG PO TABS: 7.5000 mg | ORAL_TABLET | Freq: Every day | ORAL | 6 refills | Status: DC | PRN

## 2019-07-12 NOTE — Progress Notes (Signed)
Office Visit Note   Patient: Shari Lawrence           Date of Birth: 1960-09-26           MRN: [id number] Visit Date: 07/12/2019 Requested by: Carlena Hurl, PA-C [address] [place],  [place] [address] PCP: Carlena Hurl, PA-C  Subjective: Chief Complaint  Patient presents with  . bilateral arm pain/hand pain  . both legs feel "tired" w/pain in the knees    HPI: She is here with multiple joint pain and fatigue.  In the past 2 or 3 weeks she started feeling tired in her arms and her legs.  She has pain in multiple areas.  She has been under a lot of stress lately with Covid and caring for her father.  She is concerned that she might have a cancer or something serious.  She does admit to feeling moderately depressed.  She is also without insurance this month.  [facility] and [facility] well.              ROS: No fever or chills.  All other systems were reviewed and are negative.  Objective: Vital Signs: LMP 10/25/2012 (Approximate)   Physical Exam:  General:  Alert and oriented, in no acute distress. Pulm:  Breathing unlabored. Psy:  Normal mood, congruent affect. Skin: No rash. Arms: Full range of motion bilaterally with 5/5 upper extremity strength and 2+ DTRs.  No muscular tenderness. Legs: Again full range of motion with 5/5 strength and 2+ DTRs both lower extremities.  No muscular tenderness.  Imaging: None today  Assessment & Plan: 1.  Multiple joint pain and fatigue, suspect stress mediated. -Reassurance that I do not think anything bad is happening right now. -We will try meloxicam. -If symptoms do not improve by the time she gets insurance, we may do some additional labs including thyroid studies.     Procedures: No procedures performed  No notes on file     PMFS History: Patient Active Problem List   Diagnosis Date Noted  . Chronic pain of both knees 06/14/2019  . Right arm pain 06/14/2019  . Close  exposure to COVID-19 virus 06/14/2019  . Paresthesia 04/08/2019  . Neck pain 03/30/2019  . Radicular pain in right arm 03/30/2019  . Numbness of leg 03/30/2019  . Acute stress reaction 07/24/2018  . Chest pain 07/24/2018  . Hypotension 07/17/2018  . Vertigo 07/17/2018  . Nausea 07/17/2018  . Vaccine counseling 05/21/2018  . Need for influenza vaccination 05/21/2018  . Routine general medical examination at a health care facility 05/21/2018  . Caregiver burden 07/05/2017  . Cyst of skin 07/05/2017  . Transient elevated blood pressure 06/28/2017  . Leg pain, diffuse, left 06/28/2017  . Leg swelling 06/28/2017  . Bruising 06/28/2017  . Myofascial muscle pain 06/28/2017  . High risk medication use 03/30/2017  . Chronic constipation 11/17/2016  . Globus sensation 11/17/2016  . Primary osteoarthritis of both knees 10/05/2016  . Autoimmune disease ([place]) 09/29/2016  . Primary osteoarthritis of both hands 09/29/2016  . Family history of lupus erythematosus 09/20/2016  . Polyarthralgia 05/06/2016  . Clenching of teeth 05/06/2016  . Finger swelling 05/06/2016  . Morning stiffness of joints 05/06/2016  . Myalgia 05/06/2016  . Family history of rheumatoid arthritis 05/06/2016  . Essential hypertension 12/23/2014  . History of anemia 12/23/2014  . Care provider for parents 12/23/2014  . Post-operative state 11/13/2012  . Hyperlipidemia 11/11/2012  .  Hypertension 11/11/2012  . Impaired fasting glucose 10/25/2011  . Gluten intolerance 10/25/2011  . Gastroesophageal reflux disease without esophagitis 10/25/2011  . Anxiety   . Iron deficiency anemia    Past Medical History:  Diagnosis Date  . Abnormal Pap smear 1988   treated with cryo  . Anemia 11/2010   hematology consult prior; etiology malabsorption and uterine bleeding  . Anxiety   . Gastric polyp   . GERD (gastroesophageal reflux disease)   . H/O bone density study 12/2007  . H/O hysterectomy for benign disease 5/14  .  History of echocardiogram 03/2010   normal LV function, EF 60-65%, mild left atrial enlargement  . HTN (hypertension) 03/2010   hospitalization for HTN urgency  . Hyperlipemia   . Internal hemorrhoids   . Lumbar degenerative disc disease   . Migraine   . Myalgia   . Numbness and tingling   . Spondylosis, cervical   . Umbilical hernia   . Uterine fibroid    hx/o     Family History  Problem Relation Age of Onset  . Hypertension Mother   . Dementia Mother   . Colon cancer Mother 43  . Deep vein thrombosis Father   . Hypertension Father   . Cancer Father        prostate CA  . Heart disease Father 33       CAD  . Parkinsonism Father   . Hypertension Sister   . Hyperlipidemia Sister   . Hypertension Brother   . Hyperlipidemia Brother   . Hypertension Maternal Aunt   . Hyperlipidemia Maternal Aunt   . Rheum arthritis Maternal Aunt   . Diabetes Maternal Aunt        1 with type II, 1 with type 1  . Hypertension Brother   . Rheum arthritis Brother   . Diabetes Paternal Aunt        type II  . Breast cancer Neg Hx     Past Surgical History:  Procedure Laterality Date  . CERVICAL FUSION  2010  . CERVIX LESION DESTRUCTION  1988  . CESAREAN SECTION    . CHOLECYSTECTOMY  2003  . COLONOSCOPY  08/2010   Dr. Collene Mares  . COLONOSCOPY  02/2017   Dr. Collene Mares  . ESOPHAGOGASTRODUODENOSCOPY  08/2010   Dr. Collene Mares  . EXPLORATORY LAPAROTOMY    . HERNIA REPAIR    . MYOMECTOMY  1998  . PELVIC LAPAROSCOPY    . ROBOTIC ASSISTED LAPAROSCOPIC HYSTERECTOMY AND SALPINGECTOMY  10/2012   [facility]; laproscopic due to fibroids  . UMBILICAL HERNIA REPAIR  10/2013   infected laparoscopic port, mesh placement   Social History   Occupational History    Employer: [employer]    Comment: molecular Production designer, theatre/television/film, PhD candidate, [employer]  Tobacco Use  . Smoking status: Never Smoker  . Smokeless tobacco: Never Used  Substance and Sexual Activity  . Alcohol use: No  . Drug use: No  . Sexual activity:  Not Currently    Partners: Male    Birth control/protection: Surgical    Comment: hysterectomy

## 2019-07-16 ENCOUNTER — Ambulatory Visit: Payer: BC Managed Care – PPO

## 2019-07-22 ENCOUNTER — Encounter: Payer: BC Managed Care – PPO | Admitting: Neurology

## 2019-07-24 ENCOUNTER — Encounter: Payer: Self-pay | Admitting: Medical

## 2019-07-24 ENCOUNTER — Ambulatory Visit (INDEPENDENT_AMBULATORY_CARE_PROVIDER_SITE_OTHER): Payer: Self-pay | Admitting: Medical

## 2019-07-24 VITALS — BP 126/78 | HR 70 | Temp 98.0°F | Ht 64.0 in | Wt 125.0 lb

## 2019-07-24 DIAGNOSIS — Z840 Family history of diseases of the skin and subcutaneous tissue: Secondary | ICD-10-CM

## 2019-07-24 DIAGNOSIS — M170 Bilateral primary osteoarthritis of knee: Secondary | ICD-10-CM

## 2019-07-24 DIAGNOSIS — M2560 Stiffness of unspecified joint, not elsewhere classified: Secondary | ICD-10-CM

## 2019-07-24 DIAGNOSIS — M7918 Myalgia, other site: Secondary | ICD-10-CM

## 2019-07-24 DIAGNOSIS — T148XXA Other injury of unspecified body region, initial encounter: Secondary | ICD-10-CM

## 2019-07-24 DIAGNOSIS — F419 Anxiety disorder, unspecified: Secondary | ICD-10-CM

## 2019-07-24 DIAGNOSIS — Z862 Personal history of diseases of the blood and blood-forming organs and certain disorders involving the immune mechanism: Secondary | ICD-10-CM

## 2019-07-24 DIAGNOSIS — M791 Myalgia, unspecified site: Secondary | ICD-10-CM

## 2019-07-24 DIAGNOSIS — G8929 Other chronic pain: Secondary | ICD-10-CM

## 2019-07-24 DIAGNOSIS — M19042 Primary osteoarthritis, left hand: Secondary | ICD-10-CM

## 2019-07-24 DIAGNOSIS — Z8261 Family history of arthritis: Secondary | ICD-10-CM

## 2019-07-24 DIAGNOSIS — S90811A Abrasion, right foot, initial encounter: Secondary | ICD-10-CM

## 2019-07-24 DIAGNOSIS — M19041 Primary osteoarthritis, right hand: Secondary | ICD-10-CM

## 2019-07-24 DIAGNOSIS — Z79899 Other long term (current) drug therapy: Secondary | ICD-10-CM

## 2019-07-24 NOTE — Patient Instructions (Signed)
Recommendations:  Stop Lipitor for the next few weeks and see if the pains calm down.  Begin Gabapentin 1 capsule nightly.  After 1-2 weeks you can increase to 2 capsule per night or 200mg  at night.

## 2019-07-24 NOTE — Progress Notes (Signed)
Subjective:  Shari Lawrence is a 59 y.o. female who presents for Chief Complaint  Patient presents with  . Foot Problem    right   . Knee Problem    left wants to check for nodule      Here today for possible foot infection of the skin.  Also has concerns about her ongoing fatigue and pains.  She was using a abrasive skin scraper about 2 weeks ago and had a mild tear of the skin of her right heel.  It was tender for several days.  No redness no swelling no pus drainage.  She wanted looked at to make sure no infection.  She continues to have various pains in her bilateral elbows, forearms, back, thighs since.  No joint swelling.  Complete fatigue in general.  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  She is having to do more work helping him with full care over the last couple weeks because his caregiver was diagnosed with Covid.  No specific injury fall or trauma.  She felt some nodule in her left knee, has seen some bruising in general on her knees but she does not think she hit her knees.  She saw Dr. Junius Roads in orthopedics recently to discuss these pains as she could not get in to a visit in person to see Dr. Estanislado Pandy who is only doing virtual consults reportedly  No fever, no body aches, no chills, no respiratory symptoms, no recent Covid contacts.  No cough.  No other aggravating or relieving factors.    No other c/o.   Past Medical History:  Diagnosis Date  . Abnormal Pap smear 1988   treated with cryo  . Anemia 11/2010   hematology consult prior; etiology malabsorption and uterine bleeding  . Anxiety   . Gastric polyp   . GERD (gastroesophageal reflux disease)   . H/O bone density study 12/2007  . H/O hysterectomy for benign disease 5/14  . History of echocardiogram 03/2010   normal LV function, EF 60-65%, mild left atrial enlargement  . HTN (hypertension) 03/2010   hospitalization for HTN urgency  . Hyperlipemia   . Internal hemorrhoids   .  Lumbar degenerative disc disease   . Migraine   . Myalgia   . Numbness and tingling   . Spondylosis, cervical   . Umbilical hernia   . Uterine fibroid    hx/o    Current Outpatient Medications on File Prior to Visit  Medication Sig Dispense Refill  . acetaminophen (TYLENOL) 500 MG tablet Take 1 tablet (500 mg total) by mouth every 6 (six) hours as needed. (Patient taking differently: Take 500 mg by mouth every 6 (six) hours as needed for mild pain. ) 30 tablet 0  . ALPRAZolam (XANAX) 0.5 MG tablet Take 1 tablet (0.5 mg total) by mouth 2 (two) times daily as needed for anxiety. 45 tablet 1  . Ascorbic Acid (VITAMIN C) 1000 MG tablet Take 1,000 mg by mouth daily.     Marland Kitchen atorvastatin (LIPITOR) 20 MG tablet TAKE 1 TABLET BY MOUTH EVERY DAY 90 tablet 3  . B Complex Vitamins (B COMPLEX PO) Take 1 tablet by mouth daily.     . calcium carbonate (OS-CAL) 600 MG TABS Take 600 mg by mouth daily.     Marland Kitchen CINNAMON PO Take by mouth.    . diclofenac sodium (VOLTAREN) 1 % GEL 3 grams to 3 large joints up to 3 times daily 3 Tube  3  . estradiol (VIVELLE-DOT) 0.0375 MG/24HR Place 1 patch onto the skin 2 (two) times a week. 24 patch 3  . fish oil-omega-3 fatty acids 1000 MG capsule Take 2 g by mouth daily.    . hydroxychloroquine (PLAQUENIL) 200 MG tablet TAKE 1 TABLET BY MOUTH TWICE DAILY MONDAY THROUGH FRIDAY 40 tablet 0  . losartan (COZAAR) 25 MG tablet TAKE 1 TABLET BY MOUTH EVERY DAY 90 tablet 1  . Multiple Vitamin (MULTIVITAMIN) tablet Take 1 tablet by mouth daily.     Marland Kitchen NEXIUM 40 MG capsule Take 40 mg by mouth daily.     . Probiotic Product (PROBIOTIC ADVANCED PO) Take 1 tablet by mouth daily.     . TURMERIC PO Take 1 tablet by mouth daily.     Marland Kitchen gabapentin (NEURONTIN) 100 MG capsule Take 1 capsule (100 mg total) by mouth 3 (three) times daily. (Patient not taking: Reported on 06/06/2019) 90 capsule 11  . meloxicam (MOBIC) 15 MG tablet Take 0.5-1 tablets (7.5-15 mg total) by mouth daily as needed for  pain. (Patient not taking: Reported on 07/24/2019) 30 tablet 6  . methocarbamol (ROBAXIN) 500 MG tablet Take 1 tablet (500 mg total) by mouth at bedtime as needed for muscle spasms. (Patient not taking: Reported on 06/14/2019) 30 tablet 0   No current facility-administered medications on file prior to visit.    The following portions of the patient's history were reviewed and updated as appropriate: allergies, current medications, past family history, past medical history, past social history, past surgical history and problem list.  ROS Otherwise as in subjective above    Objective: BP 126/78   Pulse 70   Temp 98 F (36.7 C)   Ht 5\' 4"  (1.626 m)   Wt 125 lb (56.7 kg)   LMP 10/25/2012 (Approximate)   SpO2 98%   BMI 21.46 kg/m   General appearance: alert, no distress, well developed, well nourished, lean African-American female Medial right heel with faint trace of an abrasion but no obvious induration no redness no swelling not really tender She has general tenderness of her proximal forearms, tenderness of her right bicep, tenderness around the knees bilaterally but no obvious swelling, no obvious laxity or deformity of the joints, no other obvious joint swelling or joint deformity, no warmth of her joints of her knees or hands or elbows. She does seem to have a little bit reduced range of motion with elbow flexion on the left Lungs clear Heart regular rhythm, normal S1-S2 no murmurs No extremity edema, no calf tenderness no calf asymmetry There is a small purplish bruise of her left knee just medial to the patella, no other obvious bruising Neuro: Arms and legs normal strength and sensation, normal DTRs, nonfocal exam, alert and oriented x3    Assessment: Encounter Diagnoses  Name Primary?  . Abrasion of right foot, initial encounter Yes  . Other chronic pain   . Myalgia   . Primary osteoarthritis of both hands   . Primary osteoarthritis of both knees   . Anxiety   .  History of anemia   . Morning stiffness of joints   . Family history of rheumatoid arthritis   . Family history of lupus erythematosus   . High risk medication use   . Bruising   . Myofascial muscle pain      Plan: Skin abrasion-reassured no obvious infection, seems to be healing fine  She has chronic ongoing issues with pains and fatigue, joint pains, muscle aches.  I advise she stop her Lipitor short-term for the next couple weeks to see if this helps her symptoms.  She never started the gabapentin prescribed by neurology.  Advised she consider this.  Left elbow and forearm pain seems to be fibromyalgia related.  Advised relative rest, ice/heat alternative therapy 20 minutes each a few times daily.     Going forward consider bone scan, muscle biopsy.   I reviewed other recent chart records from her visit with orthopedics.     Shari Lawrence was seen today for foot problem and knee problem.  Diagnoses and all orders for this visit:  Abrasion of right foot, initial encounter  Other chronic pain  Myalgia  Primary osteoarthritis of both hands  Primary osteoarthritis of both knees  Anxiety  History of anemia  Morning stiffness of joints  Family history of rheumatoid arthritis  Family history of lupus erythematosus  High risk medication use  Bruising  Myofascial muscle pain    Follow up: pending call back

## 2019-07-29 ENCOUNTER — Other Ambulatory Visit: Payer: Self-pay | Admitting: Physician Assistant

## 2019-07-29 ENCOUNTER — Telehealth: Payer: Self-pay | Admitting: Family Medicine

## 2019-07-29 DIAGNOSIS — M359 Systemic involvement of connective tissue, unspecified: Secondary | ICD-10-CM

## 2019-07-29 DIAGNOSIS — M2550 Pain in unspecified joint: Secondary | ICD-10-CM

## 2019-07-29 DIAGNOSIS — R5383 Other fatigue: Secondary | ICD-10-CM

## 2019-07-29 NOTE — Telephone Encounter (Signed)
The patient would also like to know if she could have the tests that Dr. Estanislado Pandy usually requests: anti-DNA antibody, double-stranded & possibly C3 and C4 -- to check for flareup of autoimmune disease. These were last tested 02/2019.

## 2019-07-29 NOTE — Telephone Encounter (Signed)
Pt called in said at her last appt she didn't have her insurance and dr.hilts told her when she got it to call us to get her scheduled for blood work? Please give pt a call   [phone]

## 2019-07-29 NOTE — Telephone Encounter (Addendum)
Last Visit: 05/28/19 Next Visit: 11/14/19 Labs: 03/19/19 stable Eye exam: 08/04/2018 WNL  Okay to refill per Dr. Estanislado Pandy

## 2019-07-29 NOTE — Telephone Encounter (Signed)
Please advise on the which labs are to be drawn.

## 2019-07-29 NOTE — Telephone Encounter (Signed)
Labs ordered.

## 2019-07-30 NOTE — Telephone Encounter (Signed)
I called the patient and scheduled a "nurse visit" with me for blood work tomorrow, sometime after 1 pm.

## 2019-07-30 NOTE — Addendum Note (Signed)
Addended by: Hortencia Pilar on: 07/30/2019 07:54 AM   Modules accepted: Orders

## 2019-07-30 NOTE — Telephone Encounter (Signed)
Ordered

## 2019-07-31 ENCOUNTER — Other Ambulatory Visit: Payer: Self-pay

## 2019-07-31 ENCOUNTER — Ambulatory Visit (INDEPENDENT_AMBULATORY_CARE_PROVIDER_SITE_OTHER): Payer: Self-pay

## 2019-07-31 DIAGNOSIS — M2550 Pain in unspecified joint: Secondary | ICD-10-CM

## 2019-07-31 DIAGNOSIS — M359 Systemic involvement of connective tissue, unspecified: Secondary | ICD-10-CM

## 2019-07-31 DIAGNOSIS — M170 Bilateral primary osteoarthritis of knee: Secondary | ICD-10-CM

## 2019-07-31 DIAGNOSIS — R5383 Other fatigue: Secondary | ICD-10-CM

## 2019-07-31 DIAGNOSIS — M19041 Primary osteoarthritis, right hand: Secondary | ICD-10-CM

## 2019-07-31 DIAGNOSIS — M19042 Primary osteoarthritis, left hand: Secondary | ICD-10-CM

## 2019-07-31 NOTE — Progress Notes (Signed)
Blood tests drawn per Dr. Junius Roads: ESR, C3&C4, anti-DNA antibody, double-stranded, TPO antibody, thyroid panel +TSH, CRP, CBC/diff/plt, CMET & Iron, TIBC and ferritin.

## 2019-08-01 LAB — C3 AND C4
C3 Complement: 135 mg/dL (ref 83–193)
C4 Complement: 23 mg/dL (ref 15–57)

## 2019-08-01 LAB — COMPREHENSIVE METABOLIC PANEL
AG Ratio: 2 (calc) (ref 1.0–2.5)
ALT: 20 U/L (ref 6–29)
AST: 20 U/L (ref 10–35)
Albumin: 4.6 g/dL (ref 3.6–5.1)
Alkaline phosphatase (APISO): 58 U/L (ref 37–153)
BUN: 16 mg/dL (ref 7–25)
CO2: 30 mmol/L (ref 20–32)
Calcium: 9.7 mg/dL (ref 8.6–10.4)
Chloride: 104 mmol/L (ref 98–110)
Creat: 0.88 mg/dL (ref 0.50–1.05)
Globulin: 2.3 g/dL (calc) (ref 1.9–3.7)
Glucose, Bld: 83 mg/dL (ref 65–99)
Potassium: 4.2 mmol/L (ref 3.5–5.3)
Sodium: 140 mmol/L (ref 135–146)
Total Bilirubin: 0.2 mg/dL (ref 0.2–1.2)
Total Protein: 6.9 g/dL (ref 6.1–8.1)

## 2019-08-01 LAB — THYROID PANEL WITH TSH
Free Thyroxine Index: 2.4 (ref 1.4–3.8)
T3 Uptake: 30 % (ref 22–35)
T4, Total: 8 ug/dL (ref 5.1–11.9)
TSH: 0.86 mIU/L (ref 0.40–4.50)

## 2019-08-01 LAB — IRON,TIBC AND FERRITIN PANEL
%SAT: 18 % (calc) (ref 16–45)
Ferritin: 42 ng/mL (ref 16–232)
Iron: 57 ug/dL (ref 45–160)
TIBC: 314 mcg/dL (calc) (ref 250–450)

## 2019-08-01 LAB — THYROID PEROXIDASE ANTIBODY: Thyroperoxidase Ab SerPl-aCnc: <1 IU/mL (ref <9)

## 2019-08-01 LAB — CBC WITH DIFFERENTIAL/PLATELET
Absolute Monocytes: 368 cells/uL (ref 200–950)
Basophils Absolute: 28 cells/uL (ref 0–200)
Basophils Relative: 0.6 %
Eosinophils Absolute: 170 cells/uL (ref 15–500)
Eosinophils Relative: 3.7 %
HCT: 38.3 % (ref 35.0–45.0)
Hemoglobin: 12.7 g/dL (ref 11.7–15.5)
Lymphs Abs: 1822 cells/uL (ref 850–3900)
MCH: 30.6 pg (ref 27.0–33.0)
MCHC: 33.2 g/dL (ref 32.0–36.0)
MCV: 92.3 fL (ref 80.0–100.0)
MPV: 12.5 fL (ref 7.5–12.5)
Monocytes Relative: 8 %
Neutro Abs: 2213 cells/uL (ref 1500–7800)
Neutrophils Relative %: 48.1 %
Platelets: 179 10*3/uL (ref 140–400)
RBC: 4.15 10*6/uL (ref 3.80–5.10)
RDW: 13.2 % (ref 11.0–15.0)
Total Lymphocyte: 39.6 %
WBC: 4.6 10*3/uL (ref 3.8–10.8)

## 2019-08-01 LAB — SEDIMENTATION RATE: Sed Rate: 11 mm/h (ref 0–30)

## 2019-08-01 LAB — ANTI-DNA ANTIBODY, DOUBLE-STRANDED: ds DNA Ab: 8 IU/mL — ABNORMAL HIGH

## 2019-08-01 LAB — C-REACTIVE PROTEIN: CRP: 0.3 mg/L (ref <8.0)

## 2019-08-02 ENCOUNTER — Telehealth: Payer: Self-pay | Admitting: Family Medicine

## 2019-08-02 NOTE — Telephone Encounter (Signed)
Labs are looking good overall.  dsDNA is stable.

## 2019-08-05 ENCOUNTER — Telehealth: Payer: Self-pay | Admitting: Neurology

## 2019-08-05 NOTE — Telephone Encounter (Signed)
Pt called stating that she is having pain in both arms and having difficulty lifting things and she is also having pain in both thighs and she would like to know if this is something that she should be concerned about or should she wait for the NCV/EMG test that she is scheduled for on Feb. 24th

## 2019-08-06 NOTE — Telephone Encounter (Signed)
I reviewed the chart. She saw orthopedics 07/12/2019 for multiple joint pain and fatigue, felt to be stress mediated. When last seen at our office 04/2019 no numbness in her left leg. Had made lifestyle modifications, 60% better, had continued with pain at base of neck, may radiate down the right arm numbness that could turn into pain. The report of burning pain to thighs and buttocks sounds new? We could try higher dose of gabapentin, Keep EMG, with Dr. Krista Blue. I am willing to see her sooner, but would keep EMG.   MRI of cervical spine on April 18, 2019 from [facility] showed solid fusion at C5-6, C6-7, degenerative changes at rest of the cervical levels,  Mild central stenosis at C3-4, but no evidence of cord compression\ bulging disc at C4-5, with central canal stenosis, minimum cord compression, moderate right, mild left foraminal stenosis, C 71, mild canal stenosis, minimum bilateral foraminal narrowing.

## 2019-08-06 NOTE — Telephone Encounter (Signed)
I LMVM for pt that per SS/NP can increasee gabapentin, keep [place]/emg appt with Dr. Krista Blue.  See SS/NP sooner if availability when called.

## 2019-08-06 NOTE — Telephone Encounter (Signed)
I spoke to pt and she states that she is having neck pain as previously noted, but also burning pain in tops thighs, and buttocks.  She is back at work Electrical engineer) teaching.  Taking gabapentin 100-323m daily.  Meloxicam/ ES tylenol prn, no robaxin.  Concerned that she is deteriating again.  Has [facility] in 08-21-19.  See earlier for appt?  Please advise.

## 2019-08-08 ENCOUNTER — Ambulatory Visit: Payer: BC Managed Care – PPO

## 2019-08-21 ENCOUNTER — Ambulatory Visit: Payer: BC Managed Care – PPO | Admitting: Neurology

## 2019-08-21 ENCOUNTER — Ambulatory Visit (INDEPENDENT_AMBULATORY_CARE_PROVIDER_SITE_OTHER): Payer: BC Managed Care – PPO | Admitting: Neurology

## 2019-08-21 DIAGNOSIS — M542 Cervicalgia: Secondary | ICD-10-CM | POA: Diagnosis not present

## 2019-08-21 DIAGNOSIS — Z0289 Encounter for other administrative examinations: Secondary | ICD-10-CM

## 2019-08-21 DIAGNOSIS — G5603 Carpal tunnel syndrome, bilateral upper limbs: Secondary | ICD-10-CM | POA: Diagnosis not present

## 2019-08-21 MED ORDER — DULOXETINE HCL 30 MG PO CPEP: 30.0000 mg | ORAL_CAPSULE | Freq: Every day | ORAL | 5 refills | Status: DC

## 2019-08-21 NOTE — Procedures (Signed)
Full Name: Macie Goodine Gender: Female MRN #: [id number] Date of Birth: 1961-06-13    Visit Date: 08/21/2019 09:56 Age: 59 Years Examining Physician: Marcial Pacas, MD  Referring Physician: Butler Denmark, NP History: 59 year old female, with history of cervical decompression surgery, complains of neck pain, radiating discomfort to bilateral shoulder, bilateral hands paresthesia, right worse than left,  Summary of the tests:  Nerve conduction study:  Bilateral median sensory responses showed mildly prolonged peak latency with normal snap amplitude.  Right median motor responses showed mildly prolonged distal latency with normal CMAP amplitude.  Left median motor, bilateral ulnar sensory and motor responses were normal.  Electromyography:  Selected needle examinations of right upper extremity muscles and right cervical paraspinal muscles were normal.  Conclusion: This is an abnormal study.  There is electrodiagnostic evidence of bilateral median neuropathy across the wrist, consistent with bilateral carpal tunnel syndromes, right side is moderate, left side is mild.    ------------------------------- Marcial Pacas, M.D. PhD  [facility] [place],  [address] Tel: [phone] Fax: [phone]         [facility]    Nerve / Sites Muscle Latency Ref. Amplitude Ref. Rel Amp Segments Distance Velocity Ref. Area    ms ms mV mV %  cm m/s m/s mVms  R Median - APB     Wrist APB 4.9 ?4.4 6.6 ?4.0 100 Wrist - APB 7   22.8     Upper arm APB 9.1  7.5  114 Upper arm - Wrist 21 50 ?49 35.3  L Median - APB     Wrist APB 3.8 ?4.4 9.6 ?4.0 100 Wrist - APB 7   40.7     Upper arm APB 7.9  9.5  99.2 Upper arm - Wrist 21 51 ?49 39.7  R Ulnar - ADM     Wrist ADM 2.8 ?3.3 8.5 ?6.0 100 Wrist - ADM 7   44.3     B.Elbow ADM 6.4  7.4  87.6 B.Elbow - Wrist 21 59 ?49 40.0     A.Elbow ADM 8.1  7.5  101 A.Elbow - B.Elbow 10 56 ?49 41.8         A.Elbow - Wrist        SNC    Nerve / Sites Rec. Site Peak Lat Ref.  Amp Ref. Segments Distance    ms ms V V  cm  R Median - Orthodromic (Dig II, Mid palm)     Dig II Wrist 4.2 ?3.4 12 ?10 Dig II - Wrist 13  L Median - Orthodromic (Dig II, Mid palm)     Dig II Wrist 3.5 ?3.4 10 ?10 Dig II - Wrist 13  R Ulnar - Orthodromic, (Dig V, Mid palm)     Dig V Wrist 3.1 ?3.1 13 ?5 Dig V - Wrist 83           F  Wave    Nerve F Lat Ref.   ms ms  R Ulnar - ADM 27.9 ?32.0       EMG Summary Table    Spontaneous MUAP Recruitment  Muscle IA Fib PSW Fasc Other Amp Dur. Poly Pattern  R. First dorsal interosseous Normal None None None _______ Normal Normal Normal Normal  R. Pronator teres Normal None None None _______ Normal Normal Normal Normal  R. Extensor digitorum communis Normal None None None _______ Normal Normal Normal Normal  R. Brachioradialis Normal None None None _______ Normal Normal Normal  Normal  R. Flexor digitorum profundus (Ulnar) Normal None None None _______ Normal Normal Normal Normal  R. Biceps brachii Normal None None None _______ Normal Normal Normal Normal  R. Deltoid Normal None None None _______ Normal Normal Normal Normal  R. Triceps brachii Normal None None None _______ Normal Normal Normal Normal  R. Cervical paraspinals Normal None None None _______ Normal Normal Normal Normal

## 2019-08-21 NOTE — Progress Notes (Signed)
EMG nerve conduction study today only showed carpal tunnel syndromes, right side is moderate, left side is mild, there is no evidence of right cervical radiculopathy.  She continue complains of neck pain, bilateral arm deep achy pain, tried gabapentin without benefit  We will proceed with border 30 mg daily, continue physical therapy, heating pad, as needed NSAIDs, she will only return to clinic for new issues

## 2019-08-24 ENCOUNTER — Other Ambulatory Visit: Payer: Self-pay | Admitting: Medical

## 2019-08-29 ENCOUNTER — Ambulatory Visit: Payer: Self-pay | Attending: Internal Medicine

## 2019-09-04 ENCOUNTER — Other Ambulatory Visit: Payer: Self-pay | Admitting: Neurology

## 2019-09-04 ENCOUNTER — Telehealth: Payer: Self-pay | Admitting: Obstetrics and Gynecology

## 2019-09-04 NOTE — Telephone Encounter (Signed)
Patient is experiencing vaginal itching and burning.

## 2019-09-04 NOTE — Telephone Encounter (Signed)
Spoke to pt. Pt states having vaginal itching and burning x 2 days and thinks its a yeast infection. Pt now wanting to try OTC Monistat 3. Pt to call back to office to be seen if sx don't resolve. Pt agreeable.   Routing to Dr Talbert Nan for review and will close encounter.

## 2019-09-05 ENCOUNTER — Ambulatory Visit
Admission: RE | Admit: 2019-09-05 | Discharge: 2019-09-05 | Disposition: A | Discharge status: Home / Self Care | Payer: BC Managed Care – PPO | Source: Ambulatory Visit | Attending: Obstetrics and Gynecology | Admitting: Obstetrics and Gynecology

## 2019-09-05 ENCOUNTER — Other Ambulatory Visit: Payer: Self-pay

## 2019-09-05 DIAGNOSIS — M8580 Other specified disorders of bone density and structure, unspecified site: Secondary | ICD-10-CM

## 2019-09-05 DIAGNOSIS — Z780 Asymptomatic menopausal state: Secondary | ICD-10-CM

## 2019-09-08 ENCOUNTER — Ambulatory Visit
Admission: EM | Admit: 2019-09-08 | Discharge: 2019-09-08 | Disposition: A | Discharge status: Home / Self Care | Payer: BC Managed Care – PPO | Attending: Emergency Medicine | Admitting: Emergency Medicine

## 2019-09-08 ENCOUNTER — Other Ambulatory Visit: Payer: Self-pay

## 2019-09-08 ENCOUNTER — Encounter: Payer: Self-pay | Admitting: Emergency Medicine

## 2019-09-08 DIAGNOSIS — R319 Hematuria, unspecified: Secondary | ICD-10-CM | POA: Diagnosis not present

## 2019-09-08 DIAGNOSIS — B9689 Other specified bacterial agents as the cause of diseases classified elsewhere: Secondary | ICD-10-CM | POA: Diagnosis not present

## 2019-09-08 DIAGNOSIS — N760 Acute vaginitis: Secondary | ICD-10-CM | POA: Diagnosis not present

## 2019-09-08 LAB — POCT URINALYSIS DIP (MANUAL ENTRY)
Bilirubin, UA: NEGATIVE
Glucose, UA: NEGATIVE mg/dL
Ketones, POC UA: NEGATIVE mg/dL
Leukocytes, UA: NEGATIVE
Nitrite, UA: NEGATIVE
Protein Ur, POC: 30 mg/dL — AB
Spec Grav, UA: 1.025 (ref 1.010–1.025)
Urobilinogen, UA: 1 E.U./dL
pH, UA: 7 (ref 5.0–8.0)

## 2019-09-08 MED ORDER — FLUCONAZOLE 200 MG PO TABS: 200.0000 mg | ORAL_TABLET | Freq: Once | ORAL | 0 refills | Status: AC

## 2019-09-08 NOTE — Discharge Instructions (Addendum)
Take yeast med as prescribed. Avoid soaps near vaginal canal. Air out at bed time. Follow up w/ PCP in 1 month for reevaluation of blood in urine

## 2019-09-08 NOTE — ED Triage Notes (Signed)
Pt presents to [facility] for assessment of 5 days of vaginal irritation.  Denies burning with urination, but does c/o seeing blood after urinating today while wiping.   Patient states she has not noticed a discharge.   Patient did a 3 day Monistat treatment.  Does c/o of intermittent generalized abdominal pain.

## 2019-09-08 NOTE — ED Provider Notes (Signed)
[facility]    CSN: [id number] Arrival date & time: 09/08/19  0841      History   Chief Complaint Chief Complaint  Patient presents with  . Vaginal Itching    HPI Shari Lawrence is a 59 y.o. female with history of hypertension, lumbar DDD, uterine fibroids s/p hysterectomy presenting for 5-day course of vaginal irritation.  Patient denies burning with urination, urinary frequency, urgency, hematuria.  Patient denies vaginal discharge, though states that she noticed little bit of pink when she wiped today prompting her to seek evaluation.  Patient states that she did a 3-day course of Monistat sent symptom onset with improvement in symptoms.  Patient did note that she tried a new soap from [employer] and that when she uses this irritation is worse.  Patient denying vaginal pelvic pain, abdominal back pain, fever.  Patient not currently sexually active: Last coitus 5 years ago and denies history of STI.  Past Medical History:  Diagnosis Date  . Abnormal Pap smear 1988   treated with cryo  . Anemia 11/2010   hematology consult prior; etiology malabsorption and uterine bleeding  . Anxiety   . Gastric polyp   . GERD (gastroesophageal reflux disease)   . H/O bone density study 12/2007  . H/O hysterectomy for benign disease 5/14  . History of echocardiogram 03/2010   normal LV function, EF 60-65%, mild left atrial enlargement  . HTN (hypertension) 03/2010   hospitalization for HTN urgency  . Hyperlipemia   . Internal hemorrhoids   . Lumbar degenerative disc disease   . Migraine   . Myalgia   . Numbness and tingling   . Spondylosis, cervical   . Umbilical hernia   . Uterine fibroid    hx/o     Patient Active Problem List   Diagnosis Date Noted  . Bilateral carpal tunnel syndrome 08/21/2019  . Chronic pain of both knees 06/14/2019  . Right arm pain 06/14/2019  . Close exposure to COVID-19 virus 06/14/2019  . Paresthesia 04/08/2019  . Neck pain  03/30/2019  . Radicular pain in right arm 03/30/2019  . Numbness of leg 03/30/2019  . Acute stress reaction 07/24/2018  . Chest pain 07/24/2018  . Hypotension 07/17/2018  . Vertigo 07/17/2018  . Nausea 07/17/2018  . Vaccine counseling 05/21/2018  . Need for influenza vaccination 05/21/2018  . Routine general medical examination at a health care facility 05/21/2018  . Caregiver burden 07/05/2017  . Cyst of skin 07/05/2017  . Transient elevated blood pressure 06/28/2017  . Leg pain, diffuse, left 06/28/2017  . Leg swelling 06/28/2017  . Bruising 06/28/2017  . Myofascial muscle pain 06/28/2017  . High risk medication use 03/30/2017  . Chronic constipation 11/17/2016  . Globus sensation 11/17/2016  . Primary osteoarthritis of both knees 10/05/2016  . Autoimmune disease ([place]) 09/29/2016  . Primary osteoarthritis of both hands 09/29/2016  . Family history of lupus erythematosus 09/20/2016  . Polyarthralgia 05/06/2016  . Clenching of teeth 05/06/2016  . Finger swelling 05/06/2016  . Morning stiffness of joints 05/06/2016  . Myalgia 05/06/2016  . Family history of rheumatoid arthritis 05/06/2016  . Essential hypertension 12/23/2014  . History of anemia 12/23/2014  . Care provider for parents 12/23/2014  . Post-operative state 11/13/2012  . Hyperlipidemia 11/11/2012  . Hypertension 11/11/2012  . Impaired fasting glucose 10/25/2011  . Gluten intolerance 10/25/2011  . Gastroesophageal reflux disease without esophagitis 10/25/2011  . Anxiety   . Iron deficiency anemia  Past Surgical History:  Procedure Laterality Date  . CERVICAL FUSION  2010  . CERVIX LESION DESTRUCTION  1988  . CESAREAN SECTION    . CHOLECYSTECTOMY  2003  . COLONOSCOPY  08/2010   Dr. Collene Mares  . COLONOSCOPY  02/2017   Dr. Collene Mares  . ESOPHAGOGASTRODUODENOSCOPY  08/2010   Dr. Collene Mares  . EXPLORATORY LAPAROTOMY    . HERNIA REPAIR    . MYOMECTOMY  1998  . PELVIC LAPAROSCOPY    . ROBOTIC ASSISTED LAPAROSCOPIC  HYSTERECTOMY AND SALPINGECTOMY  10/2012   [facility]; laproscopic due to fibroids  . UMBILICAL HERNIA REPAIR  10/2013   infected laparoscopic port, mesh placement    OB History    Gravida  1   Para  1   Term  1   Preterm  0   AB  0   Living  1     SAB  0   TAB  0   Ectopic  0   Multiple  0   Live Births  1            Home Medications    Prior to Admission medications   Medication Sig Start Date End Date Taking? Authorizing Provider  hydroxychloroquine (PLAQUENIL) 200 MG tablet TAKE 1 TABLET BY MOUTH TWICE DAILY MONDAY THROUGH FRIDAY 07/29/19   [name], MD  acetaminophen (TYLENOL) 500 MG tablet Take 1 tablet (500 mg total) by mouth every 6 (six) hours as needed. Patient taking differently: Take 500 mg by mouth every 6 (six) hours as needed for mild pain.  04/01/17   Waynetta Pean, PA-C  ALPRAZolam Duanne Moron) 0.5 MG tablet Take 1 tablet (0.5 mg total) by mouth 2 (two) times daily as needed for anxiety. 06/14/19   Tysinger, [name], PA-C  Ascorbic Acid (VITAMIN C) 1000 MG tablet Take 1,000 mg by mouth daily.     [provider]  atorvastatin (LIPITOR) 20 MG tablet TAKE 1 TABLET BY MOUTH EVERY DAY 06/14/19   Tysinger, [name], PA-C  B Complex Vitamins (B COMPLEX PO) Take 1 tablet by mouth daily.     [provider]  calcium carbonate (OS-CAL) 600 MG TABS Take 600 mg by mouth daily.     [provider]  CINNAMON PO Take by mouth.    [provider]  diclofenac sodium (VOLTAREN) 1 % GEL 3 grams to 3 large joints up to 3 times daily 09/06/18   [name], MD  DULoxetine (CYMBALTA) 30 MG capsule TAKE 1 CAPSULE BY MOUTH EVERY DAY 09/04/19   Marcial Pacas, MD  estradiol (VIVELLE-DOT) 0.0375 MG/24HR Place 1 patch onto the skin 2 (two) times a week. 06/06/19   [name], MD  fish oil-omega-3 fatty acids 1000 MG capsule Take 2 g by mouth daily.    [provider]  fluconazole (DIFLUCAN) 200 MG tablet Take 1 tablet (200 mg  total) by mouth once for 1 dose. May repeat in 72 hours if needed 09/08/19 09/08/19  Hall-Potvin, [place], PA-C  gabapentin (NEURONTIN) 100 MG capsule Take 1 capsule (100 mg total) by mouth 3 (three) times daily. Patient not taking: Reported on 06/06/2019 04/08/19   Marcial Pacas, MD  losartan (COZAAR) 25 MG tablet TAKE 1 TABLET BY MOUTH EVERY DAY 08/26/19   Tysinger, [name], PA-C  meloxicam (MOBIC) 15 MG tablet Take 0.5-1 tablets (7.5-15 mg total) by mouth daily as needed for pain. Patient not taking: Reported on 07/24/2019 07/12/19   Hilts, Legrand Como, MD  methocarbamol (ROBAXIN) 500 MG  tablet Take 1 tablet (500 mg total) by mouth at bedtime as needed for muscle spasms. Patient not taking: Reported on 06/14/2019 03/29/19   Tysinger, [name], PA-C  Multiple Vitamin (MULTIVITAMIN) tablet Take 1 tablet by mouth daily.     [provider]  NEXIUM 40 MG capsule Take 40 mg by mouth daily.  03/13/15   [provider]  Probiotic Product (PROBIOTIC ADVANCED PO) Take 1 tablet by mouth daily.     [provider]  TURMERIC PO Take 1 tablet by mouth daily.     [provider]    Family History Family History  Problem Relation Age of Onset  . Hypertension Mother   . Dementia Mother   . Colon cancer Mother 64  . Deep vein thrombosis Father   . Hypertension Father   . Cancer Father        prostate CA  . Heart disease Father 25       CAD  . Parkinsonism Father   . Hypertension Sister   . Hyperlipidemia Sister   . Hypertension Brother   . Hyperlipidemia Brother   . Hypertension Maternal Aunt   . Hyperlipidemia Maternal Aunt   . Rheum arthritis Maternal Aunt   . Diabetes Maternal Aunt        1 with type II, 1 with type 1  . Hypertension Brother   . Rheum arthritis Brother   . Diabetes Paternal Aunt        type II  . Breast cancer Neg Hx     Social History Social History   Tobacco Use  . Smoking status: Never Smoker  . Smokeless tobacco: Never Used  Substance  Use Topics  . Alcohol use: No  . Drug use: No     Allergies   Quinolones, Latex, Levaquin [levofloxacin], Penicillins, Percocet [oxycodone-acetaminophen], Sulfa drugs cross reactors, Vicodin [hydrocodone-acetaminophen], Zoloft [sertraline hcl], and Gluten meal   Review of Systems As per HPI   Physical Exam Triage Vital Signs ED Triage Vitals  Enc Vitals Group     BP      Pulse      Resp      Temp      Temp src      SpO2      Weight      Height      Head Circumference      Peak Flow      Pain Score      Pain Loc      Pain Edu?      Excl. in [place]?    No data found.  Updated Vital Signs BP (!) 148/87 (BP Location: Left Arm)   Pulse 70   Temp (!) 97.5 F (36.4 C) (Temporal)   Resp 16   LMP 10/25/2012 (Approximate)   SpO2 98%   Visual Acuity Right Eye Distance:   Left Eye Distance:   Bilateral Distance:    Right Eye Near:   Left Eye Near:    Bilateral Near:     Physical Exam Constitutional:      General: She is not in acute distress. HENT:     Head: Normocephalic and atraumatic.  Eyes:     General: No scleral icterus.    Pupils: Pupils are equal, round, and reactive to light.  Cardiovascular:     Rate and Rhythm: Normal rate.  Pulmonary:     Effort: Pulmonary effort is normal. No respiratory distress.     Breath sounds: No wheezing.  Genitourinary:  Comments: Patient declined Skin:    Coloration: Skin is not jaundiced or pale.  Neurological:     Mental Status: She is alert and oriented to person, place, and time.      UC Treatments / Results  Labs (all labs ordered are listed, but only abnormal results are displayed) Labs Reviewed  POCT URINALYSIS DIP (MANUAL ENTRY) - Abnormal; Notable for the following components:      Result Value   Blood, UA small (*)    Protein Ur, POC =30 (*)    All other components within normal limits    EKG   Radiology No results found.  Procedures Procedures (including critical care time)  Medications  Ordered in UC Medications - No data to display  Initial Impression / Assessment and Plan / UC Course  I have reviewed the triage vital signs and the nursing notes.  Pertinent labs & imaging results that were available during my care of the patient were reviewed by me and considered in my medical decision making (see chart for details).     Patient afebrile, nontoxic in office today.  Urine dipstick showing small blood, 30 protein, otherwise unremarkable.  History suspicious for acute vaginitis: Candida versus irritant.  Patient to avoid using bed [employer] soap as this has exacerbated symptoms.  Monistat helped with symptoms, will send in Diflucan for suspected yeast.  Patient denies history of BV, discharge or pelvic pain, and has not been sexually active in years: Deferring cervical vaginal swab testing.  Patient to follow-up with PCP regarding hematuria.  Return precautions discussed, patient verbalized understanding and is agreeable to plan. Final Clinical Impressions(s) / UC Diagnoses   Final diagnoses:  Acute vaginitis  Hematuria, unspecified type     Discharge Instructions     Take yeast med as prescribed. Avoid soaps near vaginal canal. Air out at bed time. Follow up w/ PCP in 1 month for reevaluation of blood in urine    ED Prescriptions    Medication Sig Dispense Auth. Provider   fluconazole (DIFLUCAN) 200 MG tablet Take 1 tablet (200 mg total) by mouth once for 1 dose. May repeat in 72 hours if needed 2 tablet Hall-Potvin, [place], PA-C     PDMP not reviewed this encounter.   Neldon [place], [place] 09/08/19 [phone]

## 2019-09-08 NOTE — ED Notes (Signed)
Patient able to ambulate independently  

## 2019-09-10 ENCOUNTER — Ambulatory Visit: Payer: BC Managed Care – PPO | Admitting: Obstetrics and Gynecology

## 2019-09-10 ENCOUNTER — Encounter: Payer: Self-pay | Admitting: Obstetrics and Gynecology

## 2019-09-10 ENCOUNTER — Other Ambulatory Visit: Payer: Self-pay

## 2019-09-10 VITALS — BP 120/86 | HR 66 | Temp 98.2°F | Resp 12 | Ht 64.0 in | Wt 118.0 lb

## 2019-09-10 DIAGNOSIS — N898 Other specified noninflammatory disorders of vagina: Secondary | ICD-10-CM | POA: Diagnosis not present

## 2019-09-10 DIAGNOSIS — R109 Unspecified abdominal pain: Secondary | ICD-10-CM | POA: Diagnosis not present

## 2019-09-10 DIAGNOSIS — R319 Hematuria, unspecified: Secondary | ICD-10-CM

## 2019-09-10 DIAGNOSIS — R102 Pelvic and perineal pain: Secondary | ICD-10-CM | POA: Diagnosis not present

## 2019-09-10 LAB — POCT URINALYSIS DIPSTICK
Bilirubin, UA: NEGATIVE
Glucose, UA: NEGATIVE
Ketones, UA: NEGATIVE
Leukocytes, UA: NEGATIVE
Nitrite, UA: NEGATIVE
Protein, UA: POSITIVE — AB
Urobilinogen, UA: 0.2 E.U./dL
pH, UA: 5 (ref 5.0–8.0)

## 2019-09-10 NOTE — Progress Notes (Signed)
GYNECOLOGY  VISIT   HPI: 59 y.o.   Divorced Black or [place] American Not Hispanic or Latino  female   [id number] with Patient's last menstrual period was 10/25/2012 (approximate).   here for vaginal irritation and pelvic pressure.  She c/o a 1 week h/o vaginal irritation, she tried monistat x 3 days. She continues to feel some irritation, noticed some pink blood when she wipes. No blood on her underwear. Only notices the blood when she wipes after voiding. No dysuria.  She c/o a 2 weeks h/o lower abdominal and pelvic pressure, it is a constant discomfort can extend into her vaginal area or her buttocks.  She has a h/o IBS, she has had diarrhea this last week, normal BM today. No change in baseline urinary frequency.  H/O hysterectomy and BS, still has her ovaries.  Her dad died last weekend. Her daughter is home with her.   She feels worn out and just doesn't feel well since November. She has seen her Primary, Orthopedics, Neurology and Rheumatology.   GYNECOLOGIC HISTORY: Patient's last menstrual period was 10/25/2012 (approximate). Contraception: Hysterectomy Menopausal hormone therapy: vivelle-dot         OB History    Gravida  1   Para  1   Term  1   Preterm  0   AB  0   Living  1     SAB  0   TAB  0   Ectopic  0   Multiple  0   Live Births  1              Patient Active Problem List   Diagnosis Date Noted  . Bilateral carpal tunnel syndrome 08/21/2019  . Chronic pain of both knees 06/14/2019  . Right arm pain 06/14/2019  . Close exposure to COVID-19 virus 06/14/2019  . Paresthesia 04/08/2019  . Neck pain 03/30/2019  . Radicular pain in right arm 03/30/2019  . Numbness of leg 03/30/2019  . Acute stress reaction 07/24/2018  . Chest pain 07/24/2018  . Hypotension 07/17/2018  . Vertigo 07/17/2018  . Nausea 07/17/2018  . Vaccine counseling 05/21/2018  . Need for influenza vaccination 05/21/2018  . Routine general medical examination at a health care  facility 05/21/2018  . Caregiver burden 07/05/2017  . Cyst of skin 07/05/2017  . Transient elevated blood pressure 06/28/2017  . Leg pain, diffuse, left 06/28/2017  . Leg swelling 06/28/2017  . Bruising 06/28/2017  . Myofascial muscle pain 06/28/2017  . High risk medication use 03/30/2017  . Chronic constipation 11/17/2016  . Globus sensation 11/17/2016  . Primary osteoarthritis of both knees 10/05/2016  . Autoimmune disease ([place]) 09/29/2016  . Primary osteoarthritis of both hands 09/29/2016  . Family history of lupus erythematosus 09/20/2016  . Polyarthralgia 05/06/2016  . Clenching of teeth 05/06/2016  . Finger swelling 05/06/2016  . Morning stiffness of joints 05/06/2016  . Myalgia 05/06/2016  . Family history of rheumatoid arthritis 05/06/2016  . Essential hypertension 12/23/2014  . History of anemia 12/23/2014  . Care provider for parents 12/23/2014  . Post-operative state 11/13/2012  . Hyperlipidemia 11/11/2012  . Hypertension 11/11/2012  . Impaired fasting glucose 10/25/2011  . Gluten intolerance 10/25/2011  . Gastroesophageal reflux disease without esophagitis 10/25/2011  . Anxiety   . Iron deficiency anemia     Past Medical History:  Diagnosis Date  . Abnormal Pap smear 1988   treated with cryo  . Anemia 11/2010   hematology consult prior; etiology malabsorption and uterine bleeding  .  Anxiety   . Gastric polyp   . GERD (gastroesophageal reflux disease)   . H/O bone density study 12/2007  . H/O hysterectomy for benign disease 5/14  . History of echocardiogram 03/2010   normal LV function, EF 60-65%, mild left atrial enlargement  . HTN (hypertension) 03/2010   hospitalization for HTN urgency  . Hyperlipemia   . Internal hemorrhoids   . Lumbar degenerative disc disease   . Migraine   . Myalgia   . Numbness and tingling   . Spondylosis, cervical   . Umbilical hernia   . Uterine fibroid    hx/o     Past Surgical History:  Procedure Laterality Date  .  CERVICAL FUSION  2010  . CERVIX LESION DESTRUCTION  1988  . CESAREAN SECTION    . CHOLECYSTECTOMY  2003  . COLONOSCOPY  08/2010   Dr. Collene Mares  . COLONOSCOPY  02/2017   Dr. Collene Mares  . ESOPHAGOGASTRODUODENOSCOPY  08/2010   Dr. Collene Mares  . EXPLORATORY LAPAROTOMY    . HERNIA REPAIR    . MYOMECTOMY  1998  . PELVIC LAPAROSCOPY    . ROBOTIC ASSISTED LAPAROSCOPIC HYSTERECTOMY AND SALPINGECTOMY  10/2012   [facility]; laproscopic due to fibroids  . UMBILICAL HERNIA REPAIR  10/2013   infected laparoscopic port, mesh placement    Current Outpatient Medications  Medication Sig Dispense Refill  . acetaminophen (TYLENOL) 500 MG tablet Take 1 tablet (500 mg total) by mouth every 6 (six) hours as needed. (Patient taking differently: Take 500 mg by mouth every 6 (six) hours as needed for mild pain. ) 30 tablet 0  . ALPRAZolam (XANAX) 0.5 MG tablet Take 1 tablet (0.5 mg total) by mouth 2 (two) times daily as needed for anxiety. 45 tablet 1  . Ascorbic Acid (VITAMIN C) 1000 MG tablet Take 1,000 mg by mouth daily.     Marland Kitchen atorvastatin (LIPITOR) 20 MG tablet TAKE 1 TABLET BY MOUTH EVERY DAY 90 tablet 3  . B Complex Vitamins (B COMPLEX PO) Take 1 tablet by mouth daily.     . calcium carbonate (OS-CAL) 600 MG TABS Take 600 mg by mouth daily.     Marland Kitchen CINNAMON PO Take by mouth.    . diclofenac sodium (VOLTAREN) 1 % GEL 3 grams to 3 large joints up to 3 times daily 3 Tube 3  . DULoxetine (CYMBALTA) 30 MG capsule TAKE 1 CAPSULE BY MOUTH EVERY DAY 90 capsule 1  . estradiol (VIVELLE-DOT) 0.0375 MG/24HR Place 1 patch onto the skin 2 (two) times a week. 24 patch 3  . fish oil-omega-3 fatty acids 1000 MG capsule Take 2 g by mouth daily.    . hydroxychloroquine (PLAQUENIL) 200 MG tablet TAKE 1 TABLET BY MOUTH TWICE DAILY MONDAY THROUGH FRIDAY 40 tablet 0  . losartan (COZAAR) 25 MG tablet TAKE 1 TABLET BY MOUTH EVERY DAY 90 tablet 1  . meloxicam (MOBIC) 15 MG tablet Take 0.5-1 tablets (7.5-15 mg total) by mouth daily as needed for pain. 30  tablet 6  . Multiple Vitamin (MULTIVITAMIN) tablet Take 1 tablet by mouth daily.     Marland Kitchen NEXIUM 40 MG capsule Take 40 mg by mouth daily.     . Probiotic Product (PROBIOTIC ADVANCED PO) Take 1 tablet by mouth daily.     . TURMERIC PO Take 1 tablet by mouth daily.     . fluconazole (DIFLUCAN) 200 MG tablet     . gabapentin (NEURONTIN) 100 MG capsule Take 1 capsule (100 mg total) by mouth  3 (three) times daily. (Patient not taking: Reported on 09/10/2019) 90 capsule 11  . methocarbamol (ROBAXIN) 500 MG tablet Take 1 tablet (500 mg total) by mouth at bedtime as needed for muscle spasms. (Patient not taking: Reported on 09/10/2019) 30 tablet 0   No current facility-administered medications for this visit.     ALLERGIES: Quinolones, Latex, Levaquin [levofloxacin], Penicillins, Percocet [oxycodone-acetaminophen], Sulfa drugs cross reactors, Vicodin [hydrocodone-acetaminophen], Zoloft [sertraline hcl], and Gluten meal  Family History  Problem Relation Age of Onset  . Hypertension Mother   . Dementia Mother   . Colon cancer Mother 19  . Deep vein thrombosis Father   . Hypertension Father   . Cancer Father        prostate CA  . Heart disease Father 56       CAD  . Parkinsonism Father   . Hypertension Sister   . Hyperlipidemia Sister   . Hypertension Brother   . Hyperlipidemia Brother   . Hypertension Maternal Aunt   . Hyperlipidemia Maternal Aunt   . Rheum arthritis Maternal Aunt   . Diabetes Maternal Aunt        1 with type II, 1 with type 1  . Hypertension Brother   . Rheum arthritis Brother   . Diabetes Paternal Aunt        type II  . Breast cancer Neg Hx     Social History   Socioeconomic History  . Marital status: Divorced    Spouse name: Not on file  . Number of children: 1  . Years of education: college  . Highest education level: Doctorate  Occupational History    Employer: [employer]    Comment: molecular Production designer, theatre/television/film, PhD candidate, [employer]  Tobacco Use   . Smoking status: Never Smoker  . Smokeless tobacco: Never Used  Substance and Sexual Activity  . Alcohol use: No  . Drug use: No  . Sexual activity: Not Currently    Partners: Male    Birth control/protection: Surgical    Comment: hysterectomy  Other Topics Concern  . 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  Has 19 yo daughter.  Her siblings and her don't agree on helping take care of their parents, causes family tension.  As of 11/2014      Right-handed.   0.5 cup caffeine per day.   Her father lives with her.   Social Determinants of Health   Financial Resource Strain:   . Difficulty of Paying Living Expenses:   Food Insecurity:   . Worried About Charity fundraiser in the Last Year:   . Arboriculturist in the Last Year:   Transportation Needs:   . Film/video editor (Medical):   Marland Kitchen Lack of Transportation (Non-Medical):   Physical Activity:   . Days of Exercise per Week:   . Minutes of Exercise per Session:   Stress:   . Feeling of Stress :   Social Connections:   . Frequency of Communication with Friends and Family:   . Frequency of Social Gatherings with Friends and Family:   . Attends Religious Services:   . Active Member of Clubs or Organizations:   . Attends Archivist Meetings:   Marland Kitchen Marital Status:   Intimate Partner Violence:   .  Fear of Current or Ex-Partner:   . Emotionally Abused:   Marland Kitchen Physically Abused:   . Sexually Abused:     Review of Systems  Constitutional: Negative.   HENT: Negative.   Eyes: Negative.   Respiratory: Negative.   Cardiovascular: Negative.   Gastrointestinal: Negative.   Genitourinary:       Vaginal irritation  Blood in urine Pelvic pressure   Musculoskeletal: Negative.   Skin:  Negative.   Neurological: Negative.   Endo/Heme/Allergies: Negative.   Psychiatric/Behavioral: Negative.     PHYSICAL EXAMINATION:    BP 120/86 (BP Location: Right Arm, Patient Position: Sitting, Cuff Size: Normal)   Pulse 66   Temp 98.2 F (36.8 C) (Skin)   Resp 12   Ht 5\' 4"  (1.626 m)   Wt 118 lb (53.5 kg)   LMP 10/25/2012 (Approximate)   BMI 20.25 kg/m     General appearance: alert, cooperative and appears stated age Abdomen: soft, non-tender; moderately distended, no masses,  no organomegaly  Pelvic: External genitalia:  no lesions              Urethra:  normal appearing urethra with no masses, tenderness or lesions              Bartholins and Skenes: normal                 Vagina: mildly atrophic appearing vagina with normal color and discharge, no lesions              Cervix: absent              Bimanual Exam:  Uterus:  uterus absent              Adnexa: no mass, fullness, tenderness              Rectovaginal: Yes.  .  Confirms.              Anus:  normal sphincter tone, no lesions  Chaperone was present for exam.  Urine dip: + protein, large blood.  Wet prep: no clue, no trich, few wbc KOH: no yeast PH: 4.5   ASSESSMENT Vaginal/vulvar irritation, self treated with monistat. Slides are negative Hematuria, no other changes in her bladder function Abdominal discomfort/vaginal pressure. Mildly distended on exam, IBS symptoms. No abnormal findings on pelvic exam. She also has MS issues     PLAN Discussed vulvar skin care Send nuswab for vaginitis Urine for ua, c&s, will treat depending on the results F/U with GI or Primary for bloating. If her discomfort persists without an answer will do imaging of her ovaries.     An After Visit Summary was printed and given to the patient.   Over 20 minutes was spent in total patient care CC: Chana Bode, PA-C

## 2019-09-11 LAB — URINALYSIS, MICROSCOPIC ONLY
Bacteria, UA: NONE SEEN
Casts: NONE SEEN /lpf
WBC, UA: NONE SEEN /hpf (ref 0–5)

## 2019-09-12 LAB — NUSWAB BV AND CANDIDA, NAA
Candida albicans, NAA: NEGATIVE
Candida glabrata, NAA: NEGATIVE

## 2019-09-12 LAB — URINE CULTURE

## 2019-09-16 ENCOUNTER — Encounter: Payer: Self-pay | Admitting: Certified Nurse Midwife

## 2019-09-17 ENCOUNTER — Ambulatory Visit: Payer: BC Managed Care – PPO | Attending: Internal Medicine

## 2019-09-17 ENCOUNTER — Other Ambulatory Visit: Payer: Self-pay

## 2019-09-17 DIAGNOSIS — Z23 Encounter for immunization: Secondary | ICD-10-CM

## 2019-09-17 DIAGNOSIS — R319 Hematuria, unspecified: Secondary | ICD-10-CM

## 2019-09-17 NOTE — Progress Notes (Signed)
   Covid-19 Vaccination Clinic  Name:  Shari Lawrence    MRN: [id number] DOB: 05/08/1961  09/17/2019  [name] was observed post Covid-19 immunization for 30 minutes based on pre-vaccination screening without incident. She was provided with Vaccine Information Sheet and instruction to access the V-Safe system.   [name] was instructed to call 911 with any severe reactions post vaccine: Marland Kitchen Difficulty breathing  . Swelling of face and throat  . A fast heartbeat  . A bad rash all over body  . Dizziness and weakness   Immunizations Administered    Name Date Dose VIS Date Route   Pfizer COVID-19 Vaccine 09/17/2019  8:57 AM 0.3 mL 06/07/2019 Intramuscular   Manufacturer: [place]   Lot: [id number]   [place]: [id number]

## 2019-09-19 ENCOUNTER — Ambulatory Visit
Admission: RE | Admit: 2019-09-19 | Discharge: 2019-09-19 | Disposition: A | Discharge status: Home / Self Care | Payer: BC Managed Care – PPO | Source: Ambulatory Visit | Attending: Obstetrics and Gynecology | Admitting: Obstetrics and Gynecology

## 2019-09-19 ENCOUNTER — Other Ambulatory Visit: Payer: Self-pay

## 2019-09-19 DIAGNOSIS — Z1231 Encounter for screening mammogram for malignant neoplasm of breast: Secondary | ICD-10-CM

## 2019-09-26 ENCOUNTER — Other Ambulatory Visit: Payer: Self-pay

## 2019-09-26 ENCOUNTER — Ambulatory Visit: Payer: BC Managed Care – PPO | Admitting: Family Medicine

## 2019-09-26 ENCOUNTER — Encounter: Payer: Self-pay | Admitting: Family Medicine

## 2019-09-26 VITALS — BP 113/79 | Temp 97.0°F | Ht 64.0 in | Wt 124.0 lb

## 2019-09-26 DIAGNOSIS — F4321 Adjustment disorder with depressed mood: Secondary | ICD-10-CM | POA: Diagnosis not present

## 2019-09-26 DIAGNOSIS — F419 Anxiety disorder, unspecified: Secondary | ICD-10-CM | POA: Diagnosis not present

## 2019-09-26 DIAGNOSIS — G4700 Insomnia, unspecified: Secondary | ICD-10-CM

## 2019-09-26 MED ORDER — ALPRAZOLAM 0.5 MG PO TABS: 0.5000 mg | ORAL_TABLET | Freq: Two times a day (BID) | ORAL | 1 refills | Status: DC | PRN

## 2019-09-26 NOTE — Progress Notes (Signed)
Start time: 12:29 End time: 12:56  Virtual Visit via Video Note  I connected with Shari Lawrence on 09/26/19 at 12:00 PM EDT by a video enabled telemedicine application and verified that I am speaking with the correct person using two identifiers.  Location: Patient: home Provider: office   I discussed the limitations of evaluation and management by telemedicine and the availability of in person appointments. The patient expressed understanding and agreed to proceed.  History of Present Illness:  Chief Complaint  Patient presents with  . Consult    VIRTUAL grief consult over father's death.    She lost her father on 3/13.  He lived with her.  He had Parkinsonian syndrome, health was declining, but death was unexpected for her. He developed UTI, caused sepsis. He died in the hospital.  She is having trouble dealing with this, isn't sleeping. She has used alprazolam for sleep, but ran out about a week ago.  Last filled 07/29/19 #45, using only at night to help her sleep.  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 She does note that the alprazolam helps lift some of the tightness she feels in her chest.  Chart reviewed--she was rx'd cymbalta 30mg  in March--she states she never started it.  Reports that it was prescribed for pain, as she has various pain issues, including carpal tunnel syndrome, and was prescribed by her neurologist.  She didn't start it as her brother had side effects from it (made depression worse). She doesn't really like taking medications (had also been prescribed meloxicam, gabapentin, never used them).  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  PMH, PSH, SH reviewed  Outpatient Encounter Medications as of 09/26/2019  Medication Sig Note  . ALPRAZolam (XANAX) 0.5 MG tablet Take 1 tablet (0.5 mg total) by mouth 2 (two) times daily as needed  for anxiety.   . Ascorbic Acid (VITAMIN C) 1000 MG tablet Take 1,000 mg by mouth daily.    Marland Kitchen atorvastatin (LIPITOR) 20 MG tablet TAKE 1 TABLET BY MOUTH EVERY DAY   . B Complex Vitamins (B COMPLEX PO) Take 1 tablet by mouth daily.    . calcium carbonate (OS-CAL) 600 MG TABS Take 600 mg by mouth daily.    Marland Kitchen CINNAMON PO Take by mouth.   . estradiol (VIVELLE-DOT) 0.0375 MG/24HR Place 1 patch onto the skin 2 (two) times a week.   . fish oil-omega-3 fatty acids 1000 MG capsule Take 2 g by mouth daily.   . hydroxychloroquine (PLAQUENIL) 200 MG tablet TAKE 1 TABLET BY MOUTH TWICE DAILY MONDAY THROUGH FRIDAY   . losartan (COZAAR) 25 MG tablet TAKE 1 TABLET BY MOUTH EVERY DAY   . Multiple Vitamin (MULTIVITAMIN) tablet Take 1 tablet by mouth daily.    Marland Kitchen NEXIUM 40 MG capsule Take 40 mg by mouth daily.    . Probiotic Product (PROBIOTIC ADVANCED PO) Take 1 tablet by mouth daily.    . TURMERIC PO Take 1 tablet by mouth daily.    . [DISCONTINUED] ALPRAZolam (XANAX) 0.5 MG tablet Take 1 tablet (0.5 mg total) by mouth 2 (two) times daily as needed for anxiety.   Marland Kitchen acetaminophen (TYLENOL) 500 MG tablet Take 1 tablet (500 mg total) by mouth every 6 (six) hours as needed. (Patient not taking: Reported on 09/26/2019)   . diclofenac sodium (VOLTAREN) 1 % GEL 3 grams to 3 large  joints up to 3 times daily (Patient not taking: Reported on 09/26/2019)   . DULoxetine (CYMBALTA) 30 MG capsule TAKE 1 CAPSULE BY MOUTH EVERY DAY (Patient not taking: Reported on 09/26/2019) 09/26/2019: Not started  . [DISCONTINUED] fluconazole (DIFLUCAN) 200 MG tablet  09/10/2019: Has not picked up from pharmacy  . [DISCONTINUED] gabapentin (NEURONTIN) 100 MG capsule Take 1 capsule (100 mg total) by mouth 3 (three) times daily. (Patient not taking: Reported on 09/10/2019)   . [DISCONTINUED] meloxicam (MOBIC) 15 MG tablet Take 0.5-1 tablets (7.5-15 mg total) by mouth daily as needed for pain.   . [DISCONTINUED] methocarbamol (ROBAXIN) 500 MG tablet Take 1  tablet (500 mg total) by mouth at bedtime as needed for muscle spasms. (Patient not taking: Reported on 09/10/2019)    No facility-administered encounter medications on file as of 09/26/2019.   Allergies  Allergen Reactions  . Quinolones Anaphylaxis and Other (See Comments)    Whelps developed all over her body  . Latex Itching and Other (See Comments)    Skin yellowing  . Levaquin [Levofloxacin] Hives    Throat swelling  . Penicillins   . Percocet [Oxycodone-Acetaminophen]   . Sulfa Drugs Cross Reactors Itching, Swelling and Other (See Comments)    Red face  . Vicodin [Hydrocodone-Acetaminophen] Itching and Nausea And Vomiting  . Zoloft [Sertraline Hcl]     Felt like a zombi   . Gluten Meal     Other reaction(s): Other (See Comments) IBS    ROS:  +crying, insomnia, grief.  Chronic pain. No URI symptoms, fever.  +chest pressure (some anxiety, possibly other causes, has a lot of pain).    Observations/Objective:  BP 113/79   Temp (!) 97 F (36.1 C) (Temporal)   Ht 5\' 4"  (1.626 m)   Wt 124 lb (56.2 kg) Comment: 10 days ago  LMP 10/25/2012 (Approximate)   BMI 21.28 kg/m   Pleasant female, somewhat tearful, in no distress. She is alert and oriented. Normal eye contact, speech, grooming.   Assessment and Plan:  Grief  Anxiety - Plan: ALPRAZolam (XANAX) 0.5 MG tablet  Insomnia, unspecified type - Plan: ALPRAZolam (XANAX) 0.5 MG tablet  Grief counseling. Risks/SE of alprazolam reviewed, not recommended chronically for insomnia.  May have underlying depression/anxiety in addition to grief.  Consider counseling. Encouraged to consider starting cymbalta, which may also help with her pain.  F/u with Audelia Acton if not improving.   Reach out to [facility] (formerly [facility] for grief counseling. Consider individual counseling through a therapist (through your insurance) Your alprazolam was refilled, so that you can use it to sleep.  I think being out of  the medication and not sleeping well makes it harder to cope with the grief.  We discussed that in the long run, you should be on a different medication for chronic insomnia, if nightly medication is needed.  It is possible that treating anxiety/depression can improve sleep (as insomnia is a symptom of these conditions).  I know that the cymbalta wasn't prescribed for depression or anxiety, but instead for treating pain, but given that you have had a very difficult year related to COVID, and with many losses in addition to the recent loss of your dad, it might be a good idea to start.  It is okay to use the alprazolam along with the cymbalta, if needed.  Consider making an appointment with Audelia Acton in 2-3 weeks to further discuss moods, sleep, grief, if needed.     Follow Up Instructions:  I discussed the assessment and treatment plan with the patient. The patient was provided an opportunity to ask questions and all were answered. The patient agreed with the plan and demonstrated an understanding of the instructions.   The patient was advised to call back or seek an in-person evaluation if the symptoms worsen or if the condition fails to improve as anticipated.  I provided 27 minutes of video face-to-face time during this encounter. Additional time spent in reviewing chart, PDMP, and documentation.   Vikki Ports, MD

## 2019-09-26 NOTE — Patient Instructions (Signed)
Reach out to [facility] (formerly [facility] for grief counseling. Consider individual counseling through a therapist (through your insurance) Your alprazolam was refilled, so that you can use it to sleep.  I think being out of the medication and not sleeping well makes it harder to cope with the grief.  We discussed that in the long run, you should be on a different medication for chronic insomnia, if nightly medication is needed.  It is possible that treating anxiety/depression can improve sleep (as insomnia is a symptom of these conditions).  I know that the cymbalta wasn't prescribed for depression or anxiety, but instead for treating pain, but given that you have had a very difficult year related to COVID, and with many losses in addition to the recent loss of your dad, it might be a good idea to start.  It is okay to use the alprazolam along with the cymbalta, if needed.  Consider making an appointment with Audelia Acton in 2-3 weeks to further discuss moods, sleep, grief, if needed.    Managing Loss, Adult People experience loss in many different ways throughout their lives. Events such as moving, changing jobs, and losing friends can create a sense of loss. The loss may be as serious as a major health change, divorce, death of a pet, or death of a loved one. All of these types of loss are likely to create a physical and emotional reaction known as grief. Grief is the result of a major change or an absence of something or someone that you count on. Grief is a normal reaction to loss. A variety of factors can affect your grieving experience, including:  The nature of your loss.  Your relationship to what or whom you lost.  Your understanding of grief and how to manage it.  Your support system. How to manage lifestyle changes Keep to your normal routine as much as possible.  If you have trouble focusing or doing normal activities, it is acceptable to take some time away  from your normal routine.  Spend time with friends and loved ones.  Eat a healthy diet, get plenty of sleep, and rest when you feel tired. How to recognize changes  The way that you deal with your grief will affect your ability to function as you normally do. When grieving, you may experience these changes:  Numbness, shock, sadness, anxiety, anger, denial, and guilt.  Thoughts about death.  Unexpected crying.  A physical sensation of emptiness in your stomach.  Problems sleeping and eating.  Tiredness (fatigue).  Loss of interest in normal activities.  Dreaming about or imagining seeing the person who died.  A need to remember what or whom you lost.  Difficulty thinking about anything other than your loss for a period of time.  Relief. If you have been expecting the loss for a while, you may feel a sense of relief when it happens. Follow these instructions at home:  Activity Express your feelings in healthy ways, such as:  Talking with others about your loss. It may be helpful to find others who have had a similar loss, such as a support group.  Writing down your feelings in a journal.  Doing physical activities to release stress and emotional energy.  Doing creative activities like painting, sculpting, or playing or listening to music.  Practicing resilience. This is the ability to recover and adjust after facing challenges. Reading some resources that encourage resilience may help you to learn ways to practice those behaviors.  General instructions  Be patient with yourself and others. Allow the grieving process to happen, and remember that grieving takes time. ? It is likely that you may never feel completely done with some grief. You may find a way to move on while still cherishing memories and feelings about your loss. ? Accepting your loss is a process. It can take months or longer to adjust.  Keep all follow-up visits as told by your health care provider. This  is important. Where to find support To get support for managing loss:  Ask your health care provider for help and recommendations, such as grief counseling or therapy.  Think about joining a support group for people who are managing a loss. Where to find more information You can find more information about managing loss from:  American Society of Clinical Oncology: www.cancer.net  American Psychological Association: TVStere[employer].ch Contact a health care provider if:  Your grief is extreme and keeps getting worse.  You have ongoing grief that does not improve.  Your body shows symptoms of grief, such as illness.  You feel depressed, anxious, or lonely. Get help right away if:  You have thoughts about hurting yourself or others. If you ever feel like you may hurt yourself or others, or have thoughts about taking your own life, get help right away. You can go to your nearest emergency department or call:  Your local emergency services (911 in the [place]  A suicide crisis helpline, such as the [place] at (361)091-9808. This is open 24 hours a day. Summary  Grief is the result of a major change or an absence of someone or something that you count on. Grief is a normal reaction to loss.  The depth of grief and the period of recovery depend on the type of loss and your ability to adjust to the change and process your feelings.  Processing grief requires patience and a willingness to accept your feelings and talk about your loss with people who are supportive.  It is important to find resources that work for you and to realize that people experience grief differently. There is not one grieving process that works for everyone in the same way.  Be aware that when grief becomes extreme, it can lead to more severe issues like isolation, depression, anxiety, or suicidal thoughts. Talk with your health care provider if you have any of these issues. This  information is not intended to replace advice given to you by your health care provider. Make sure you discuss any questions you have with your health care provider. Document Revised: 08/17/2018 Document Reviewed: 10/27/2016 Elsevier Patient Education  [place].

## 2019-10-03 ENCOUNTER — Other Ambulatory Visit: Payer: Self-pay

## 2019-10-03 ENCOUNTER — Ambulatory Visit: Payer: BC Managed Care – PPO

## 2019-10-03 DIAGNOSIS — R319 Hematuria, unspecified: Secondary | ICD-10-CM

## 2019-10-03 NOTE — Progress Notes (Signed)
Patient here today for 2 week urine micro recheck, no symptoms today.

## 2019-10-04 LAB — URINALYSIS, MICROSCOPIC ONLY
Bacteria, UA: NONE SEEN
Casts: NONE SEEN /lpf
WBC, UA: NONE SEEN /hpf (ref 0–5)

## 2019-10-15 NOTE — Progress Notes (Signed)
Office Visit Note  Patient: Shari Lawrence             Date of Birth: October 10, 1960           MRN: [id number]             PCP: Carlena Hurl, PA-C Referring: Carlena Hurl, PA-C Visit Date: 10/17/2019 Occupation: @GUAROCC @  Subjective:  Generalized pain  History of Present Illness: Shari Lawrence is a 59 y.o. female with history of autoimmune disease and osteoarthritis.  She is taking plaquenil 200 mg 1 tablet by mouth twice daily M-F.  She denies any recent autoimmune disease flares.  She denies any oral or nasal ulcers.  She has mouth dryness but no eye dryness.  She has not had any symptoms of raynaud's.  She had a rash on the right upper extremity recently, which has been improving with hydrocortisone cream.  She has been having increased myofascial pain.  She is currently going to integrative therapies.  She experiences trapezius muscle tension and tenderness intermittently.  She has chronic right shoulder joint pain, which is exacerbated by overhead activities.  She states she was recently diagnosed with carpal tunnel syndrome bilaterally by her neurologist.  She has been wearing carpal tunnel braces, which help alleviate her symptoms.  Sh   Activities of Daily Living:  Patient reports morning stiffness for 1 hour.   Patient Denies nocturnal pain.  Difficulty dressing/grooming: Reports Difficulty climbing stairs: Denies Difficulty getting out of chair: Reports Difficulty using hands for taps, buttons, cutlery, and/or writing: Reports  Review of Systems  Constitutional: Positive for fatigue.  HENT: Positive for mouth dryness. Negative for mouth sores and nose dryness.   Eyes: Negative for pain, visual disturbance and dryness.  Respiratory: Negative for cough, hemoptysis, shortness of breath and difficulty breathing.   Cardiovascular: Negative for chest pain, palpitations, hypertension and swelling in legs/feet.  Gastrointestinal: Positive for constipation.  Negative for blood in stool and diarrhea.  Endocrine: Negative for increased urination.  Genitourinary: Negative for difficulty urinating and painful urination.  Musculoskeletal: Positive for arthralgias, joint pain, joint swelling, myalgias, muscle weakness, morning stiffness, muscle tenderness and myalgias.  Skin: Positive for rash. Negative for color change, pallor, hair loss, nodules/bumps, skin tightness, ulcers and sensitivity to sunlight.  Allergic/Immunologic: Negative for susceptible to infections.  Neurological: Positive for headaches. Negative for dizziness, numbness and weakness.  Hematological: Negative for swollen glands.  Psychiatric/Behavioral: Positive for sleep disturbance. Negative for depressed mood. The patient is not nervous/anxious.     PMFS History:  Patient Active Problem List   Diagnosis Date Noted  . Bilateral carpal tunnel syndrome 08/21/2019  . Chronic pain of both knees 06/14/2019  . Right arm pain 06/14/2019  . Close exposure to COVID-19 virus 06/14/2019  . Paresthesia 04/08/2019  . Neck pain 03/30/2019  . Radicular pain in right arm 03/30/2019  . Numbness of leg 03/30/2019  . Acute stress reaction 07/24/2018  . Chest pain 07/24/2018  . Hypotension 07/17/2018  . Vertigo 07/17/2018  . Nausea 07/17/2018  . Vaccine counseling 05/21/2018  . Need for influenza vaccination 05/21/2018  . Routine general medical examination at a health care facility 05/21/2018  . Caregiver burden 07/05/2017  . Cyst of skin 07/05/2017  . Transient elevated blood pressure 06/28/2017  . Leg pain, diffuse, left 06/28/2017  . Leg swelling 06/28/2017  . Bruising 06/28/2017  . Myofascial muscle pain 06/28/2017  . High risk medication use 03/30/2017  . Chronic constipation 11/17/2016  . Globus  sensation 11/17/2016  . Primary osteoarthritis of both knees 10/05/2016  . Autoimmune disease ([place]) 09/29/2016  . Primary osteoarthritis of both hands 09/29/2016  . Family history of  lupus erythematosus 09/20/2016  . Polyarthralgia 05/06/2016  . Clenching of teeth 05/06/2016  . Finger swelling 05/06/2016  . Morning stiffness of joints 05/06/2016  . Myalgia 05/06/2016  . Family history of rheumatoid arthritis 05/06/2016  . Essential hypertension 12/23/2014  . History of anemia 12/23/2014  . Care provider for parents 12/23/2014  . Post-operative state 11/13/2012  . Hyperlipidemia 11/11/2012  . Hypertension 11/11/2012  . Impaired fasting glucose 10/25/2011  . Gluten intolerance 10/25/2011  . Gastroesophageal reflux disease without esophagitis 10/25/2011  . Anxiety   . Iron deficiency anemia     Past Medical History:  Diagnosis Date  . Abnormal Pap smear 1988   treated with cryo  . Anemia 11/2010   hematology consult prior; etiology malabsorption and uterine bleeding  . Anxiety   . Gastric polyp   . GERD (gastroesophageal reflux disease)   . H/O bone density study 12/2007  . H/O hysterectomy for benign disease 5/14  . History of echocardiogram 03/2010   normal LV function, EF 60-65%, mild left atrial enlargement  . HTN (hypertension) 03/2010   hospitalization for HTN urgency  . Hyperlipemia   . Internal hemorrhoids   . Lumbar degenerative disc disease   . Migraine   . Myalgia   . Numbness and tingling   . Spondylosis, cervical   . Umbilical hernia   . Uterine fibroid    hx/o     Family History  Problem Relation Age of Onset  . Hypertension Mother   . Dementia Mother   . Colon cancer Mother 57  . Deep vein thrombosis Father   . Hypertension Father   . Cancer Father        prostate CA  . Heart disease Father 23       CAD  . Parkinsonism Father   . Hypertension Sister   . Hyperlipidemia Sister   . Hypertension Brother   . Hyperlipidemia Brother   . Hypertension Maternal Aunt   . Hyperlipidemia Maternal Aunt   . Rheum arthritis Maternal Aunt   . Diabetes Maternal Aunt        1 with type II, 1 with type 1  . Hypertension Brother   . Rheum  arthritis Brother   . Diabetes Paternal Aunt        type II  . Breast cancer Neg Hx    Past Surgical History:  Procedure Laterality Date  . ABDOMINAL HYSTERECTOMY    . CERVICAL FUSION  2010  . CERVICAL SPINE SURGERY    . CERVIX LESION DESTRUCTION  1988  . CESAREAN SECTION    . CHOLECYSTECTOMY  2003  . COLONOSCOPY  08/2010   Dr. Collene Mares  . COLONOSCOPY  02/2017   Dr. Collene Mares  . ESOPHAGOGASTRODUODENOSCOPY  08/2010   Dr. Collene Mares  . EXPLORATORY LAPAROTOMY    . HERNIA REPAIR    . MYOMECTOMY  1998  . PELVIC LAPAROSCOPY    . ROBOTIC ASSISTED LAPAROSCOPIC HYSTERECTOMY AND SALPINGECTOMY  10/2012   [facility]; laproscopic due to fibroids  . 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  Has 51 yo daughter.  Her siblings and her don't agree on helping take care of their parents, causes family tension.  As of 11/2014      Right-handed.   0.5 cup caffeine per day.   Her father lives with her.      Update 09/2019--father passed away from urosepsis in 2019/10/11   Immunization History  Administered Date(s) Administered  . Influenza,inj,Quad PF,6+ Mos 05/01/2013, 03/31/2014, 05/17/2017, 05/21/2018, 03/29/2019  . PFIZER SARS-COV-2 Vaccination 09/17/2019  . Td 02/04/2012     Objective: Vital Signs: BP 134/80 (BP Location: Left Arm, Patient Position: Sitting, Cuff Size: Normal)   Pulse 60   Resp 14   Ht 5\' 4"  (1.626 m)   Wt 123 lb (55.8 kg)   LMP 10/25/2012 (Approximate)   BMI 21.11 kg/m    Physical Exam Vitals and nursing note reviewed.  Constitutional:      Appearance: She is well-developed.  HENT:     Head: Normocephalic and atraumatic.  Eyes:     Conjunctiva/sclera: Conjunctivae  normal.  Pulmonary:     Effort: Pulmonary effort is normal.  Abdominal:     General: Bowel sounds are normal.     Palpations: Abdomen is soft.  Musculoskeletal:     Cervical back: Normal range of motion.  Lymphadenopathy:     Cervical: No cervical adenopathy.  Skin:    General: Skin is warm and dry.     Capillary Refill: Capillary refill takes less than 2 seconds.  Neurological:     Mental Status: She is alert and oriented to person, place, and time.  Psychiatric:        Behavior: Behavior normal.      Musculoskeletal Exam: Generalized hyperalgesia and positive tender points.  C-spine, thoracic spine, lumbar spine good range of motion.  Shoulder joints, elbow joints, wrist joints, MCPs, PIPs and DIPs good range of motion with no synovitis.  She has complete fist formation bilaterally.  Hip joints, knee joints, ankle joints, MTPs, PIPs and DIPs good range of motion with no synovitis.  No warmth or effusion of bilateral knee joints.  No tenderness or swelling of ankle joints.  CDAI Exam: CDAI Score: -- Patient Global: --; Provider Global: -- Swollen: --; Tender: -- Joint Exam 10/17/2019   No joint exam has been documented for this visit   There is currently no information documented on the homunculus. Go to the Rheumatology activity and complete the homunculus joint exam.  Investigation: No additional findings.  Imaging: MM 3D SCREEN BREAST BILATERAL  Result Date: 09/22/2019 CLINICAL DATA:  Screening. EXAM: DIGITAL SCREENING BILATERAL MAMMOGRAM WITH TOMO AND CAD COMPARISON:  Previous exam(s). ACR Breast Density Category c: The breast tissue is heterogeneously dense, which may obscure small masses. FINDINGS: There are no findings suspicious for malignancy. Images were processed with CAD. IMPRESSION: No mammographic evidence of malignancy. A result letter of this screening mammogram will be mailed directly to the patient. RECOMMENDATION: Screening mammogram in one year.  (Code:SM-B-01Y) BI-RADS CATEGORY  1: Negative. Electronically Signed   By: Abelardo Diesel M.D.   On: 09/22/2019 10:27    Recent Labs: Lab Results  Component Value Date   WBC 4.6 07/31/2019   HGB 12.7 07/31/2019   PLT 179 07/31/2019   NA 140 07/31/2019   K 4.2 07/31/2019   CL 104 07/31/2019   CO2 30 07/31/2019   GLUCOSE 83 07/31/2019   BUN 16 07/31/2019   CREATININE 0.88 07/31/2019   BILITOT 0.2  07/31/2019   ALKPHOS 55 05/21/2018   AST 20 07/31/2019   ALT 20 07/31/2019   PROT 6.9 07/31/2019   ALBUMIN 4.5 05/21/2018   CALCIUM 9.7 07/31/2019   GFRAA 87 03/19/2019    Speciality Comments: PLQ Eye Exam: 06/04/2019 WNL @ [facility]. Follow up in 1 year.  Procedures:  No procedures performed Allergies: Quinolones, Latex, Levaquin [levofloxacin], Penicillins, Percocet [oxycodone-acetaminophen], Sulfa drugs cross reactors, Vicodin [hydrocodone-acetaminophen], Zoloft [sertraline hcl], and Gluten meal   Assessment / Plan:     Visit Diagnoses: Autoimmune disease ([place]) - ANA 1:320 NO, ds8, RF-,CCP-,History of nasal ulcers and oral ulcers in the past, positive synovitis in left hand MCPs on [place] previously: She has not had any signs or symptoms of autoimmune disease flare.  She is clinically doing well on Plaquenil 200 mg 1 tablet by mouth twice daily Monday through Friday.  She experiences intermittent rashes which resolved with hydrocortisone cream topically.  She has not had any symptoms of Raynaud's recently.  No digital ulcerations or signs of gangrene were noted.  She has not had any oral or nasal ulcerations.  She continues to have mild dryness but has not had any eye dryness recently.  She has not had any enlarged lymph nodes or fevers.  She will continue taking Plaquenil as prescribed.  She is advised to notify us if she develops any new or worsening symptoms.  She will follow-up in the office in 5 months.  High risk medication use - PLQ 200 mg 1 tablet by mouth daily twice daily M-F  only.PLQ Eye Exam: 06/04/2019 WNL @ [facility]. Follow up in 1 year.  CBC and CMP were drawn on 07/31/2019.  She will be due to update lab work in July and every 5 months to monitor for drug toxicity.  Primary osteoarthritis of both hands: She has no tenderness or synovitis on exam.  She has not had any synovitis.  Joint protection and muscle strengthening were discussed.  Primary osteoarthritis of both knees: She has been experiencing intermittent discomfort in the left knee joint.  She is followed by Dr. Junius Roads.  She has good range of motion of both knee joints on exam with no warmth or effusion.  Myofascial muscle pain: She has generalized hyperalgesia and positive tender points on exam.  She is been having generalized myalgias and muscle tenderness due to myofascial pain syndrome.  She is been experiencing trapezius muscle tension and muscle tenderness bilaterally.  We discussed the importance of appropriate posture, neck exercises, and massage.  We discussed the importance of regular exercise and good sleep hygiene.  She continues to have chronic fatigue secondary to insomnia.  According to the patient her neurologist tried to start her on Cymbalta but she is apprehensive due to family history of suicidal ideation while taking Cymbalta.  Trochanteric bursitis of both hips: She continues to have tenderness over bilateral trochanteric bursa.  We discussed importance of performing daily stretching exercises.  Paresthesia of arm: She was diagnosed with bilateral carpal tunnel syndrome by her neurologist.  She has been wearing carpal tunnel splints which have been helpful.  She declined an ultrasound-guided cortisone injection at this time.  She was advised to notify us if she changes her mind.  Paresthesia of left leg: Resolved  Other medical conditions are listed as follows:  Family history of lupus erythematosus  Family history of rheumatoid arthritis  History of anxiety  History of  hyperlipidemia  History of gastroesophageal reflux (GERD)  History of  hypertension  History of anemia   Orders: No orders of the defined types were placed in this encounter.  No orders of the defined types were placed in this encounter.   Face-to-face time spent with patient was 30 minutes. Greater than 50% of time was spent in counseling and coordination of care.  Follow-Up Instructions: Return in about 5 months (around 03/18/2020) for Autoimmune Disease, Osteoarthritis.   Leonia Reader  I examined and evaluated the patient with Hazel Sams PA.  Patient continues to have some arthralgias due to osteoarthritis and fibromyalgia.  Her autoimmune disease is stable.  She had no synovitis on examination.  The plan of care was discussed as noted above.  [name], MD  Note - This record has been created using Editor, commissioning.  Chart creation errors have been sought, but may not always  have been located. Such creation errors do not reflect on  the standard of medical care.

## 2019-10-17 ENCOUNTER — Ambulatory Visit: Payer: BC Managed Care – PPO | Admitting: Rheumatology

## 2019-10-17 ENCOUNTER — Encounter: Payer: Self-pay | Admitting: Rheumatology

## 2019-10-17 ENCOUNTER — Other Ambulatory Visit: Payer: Self-pay

## 2019-10-17 ENCOUNTER — Telehealth: Payer: Self-pay | Admitting: Rheumatology

## 2019-10-17 VITALS — BP 134/80 | HR 60 | Resp 14 | Ht 64.0 in | Wt 123.0 lb

## 2019-10-17 DIAGNOSIS — M7062 Trochanteric bursitis, left hip: Secondary | ICD-10-CM

## 2019-10-17 DIAGNOSIS — Z8261 Family history of arthritis: Secondary | ICD-10-CM

## 2019-10-17 DIAGNOSIS — M7918 Myalgia, other site: Secondary | ICD-10-CM

## 2019-10-17 DIAGNOSIS — R202 Paresthesia of skin: Secondary | ICD-10-CM

## 2019-10-17 DIAGNOSIS — Z79899 Other long term (current) drug therapy: Secondary | ICD-10-CM

## 2019-10-17 DIAGNOSIS — Z8679 Personal history of other diseases of the circulatory system: Secondary | ICD-10-CM

## 2019-10-17 DIAGNOSIS — M359 Systemic involvement of connective tissue, unspecified: Secondary | ICD-10-CM | POA: Diagnosis not present

## 2019-10-17 DIAGNOSIS — G8929 Other chronic pain: Secondary | ICD-10-CM

## 2019-10-17 DIAGNOSIS — M170 Bilateral primary osteoarthritis of knee: Secondary | ICD-10-CM

## 2019-10-17 DIAGNOSIS — Z8719 Personal history of other diseases of the digestive system: Secondary | ICD-10-CM

## 2019-10-17 DIAGNOSIS — M25511 Pain in right shoulder: Secondary | ICD-10-CM

## 2019-10-17 DIAGNOSIS — Z840 Family history of diseases of the skin and subcutaneous tissue: Secondary | ICD-10-CM

## 2019-10-17 DIAGNOSIS — M19041 Primary osteoarthritis, right hand: Secondary | ICD-10-CM | POA: Diagnosis not present

## 2019-10-17 DIAGNOSIS — M19042 Primary osteoarthritis, left hand: Secondary | ICD-10-CM

## 2019-10-17 DIAGNOSIS — M7061 Trochanteric bursitis, right hip: Secondary | ICD-10-CM

## 2019-10-17 DIAGNOSIS — Z8659 Personal history of other mental and behavioral disorders: Secondary | ICD-10-CM

## 2019-10-17 DIAGNOSIS — Z8639 Personal history of other endocrine, nutritional and metabolic disease: Secondary | ICD-10-CM

## 2019-10-17 DIAGNOSIS — Z862 Personal history of diseases of the blood and blood-forming organs and certain disorders involving the immune mechanism: Secondary | ICD-10-CM

## 2019-10-17 NOTE — Telephone Encounter (Signed)
Patient had appointment today and wanted to confirm that she will be due for labwork in July.   [phone]

## 2019-10-17 NOTE — Telephone Encounter (Signed)
Advised patient that she is due to update labs in July. Patient verbalized understanding.

## 2019-11-13 ENCOUNTER — Other Ambulatory Visit: Payer: Self-pay

## 2019-11-13 ENCOUNTER — Encounter: Payer: Self-pay | Admitting: Medical

## 2019-11-13 ENCOUNTER — Ambulatory Visit: Payer: BC Managed Care – PPO | Admitting: Medical

## 2019-11-13 VITALS — BP 142/86 | HR 59 | Ht 64.0 in | Wt 121.6 lb

## 2019-11-13 DIAGNOSIS — M79641 Pain in right hand: Secondary | ICD-10-CM

## 2019-11-13 DIAGNOSIS — M79642 Pain in left hand: Secondary | ICD-10-CM | POA: Insufficient documentation

## 2019-11-13 DIAGNOSIS — Z79899 Other long term (current) drug therapy: Secondary | ICD-10-CM

## 2019-11-13 DIAGNOSIS — F419 Anxiety disorder, unspecified: Secondary | ICD-10-CM

## 2019-11-13 DIAGNOSIS — K5909 Other constipation: Secondary | ICD-10-CM | POA: Diagnosis not present

## 2019-11-13 DIAGNOSIS — G479 Sleep disorder, unspecified: Secondary | ICD-10-CM | POA: Insufficient documentation

## 2019-11-13 DIAGNOSIS — Z636 Dependent relative needing care at home: Secondary | ICD-10-CM

## 2019-11-13 DIAGNOSIS — M7918 Myalgia, other site: Secondary | ICD-10-CM

## 2019-11-13 DIAGNOSIS — Z8261 Family history of arthritis: Secondary | ICD-10-CM

## 2019-11-13 DIAGNOSIS — K219 Gastro-esophageal reflux disease without esophagitis: Secondary | ICD-10-CM | POA: Diagnosis not present

## 2019-11-13 DIAGNOSIS — W19XXXA Unspecified fall, initial encounter: Secondary | ICD-10-CM | POA: Insufficient documentation

## 2019-11-13 DIAGNOSIS — F4321 Adjustment disorder with depressed mood: Secondary | ICD-10-CM | POA: Insufficient documentation

## 2019-11-13 DIAGNOSIS — M2560 Stiffness of unspecified joint, not elsewhere classified: Secondary | ICD-10-CM

## 2019-11-13 MED ORDER — LUBIPROSTONE 24 MCG PO CAPS: 24.0000 ug | ORAL_CAPSULE | Freq: Two times a day (BID) | ORAL | 1 refills | Status: DC

## 2019-11-13 NOTE — Progress Notes (Signed)
Subjective: Chief Complaint  Patient presents with  . Follow-up    started xanax due to grief-stoppd taking cymbalta   . Constipation    issue with cost of mediciation that will work for her   . 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  Her father was living with her.  He unfortunately died recently.  He had some type of infection, bedsores and impaction of stool.  She is trying now to figure out the next phase of her life.  She has started exercising some.  She recently went roller skating on her birthday and slipped and fell backwards on her wrist and hands.  DOI 10/31/2019.   Has some pain in the palms of her hands.  No decreased range of motion no swelling or bruising  She saw Dr. Tomi Bamberger recently when I was out of the office.  I discussed her trying to minimize use of Xanax although she only uses 0.25 mg occasionally for sleep or anxiety.  She recently stopped Cymbalta as to her family members that her on Cymbalta had noted prior suicidal ideation.  She and her rheumatologist felt like she should not be on Cymbalta anymore for pain.  She has taken sertraline in the past with some good improvement but she is not sure she wants to do this for.  She is also taking other sleep aids over-the-counter they make her too groggy in the morning.  She tries to limit her use of any sleep aid.  Ambien makes her feel unusual with no motion and really sedates her significantly.  She continues to have a lot of problems with chronic constipation.  She sees GI, Dr. Collene Mares [facility] recently wrote her prescription for [place] but the co-pay was going to be $600.  She has tried some Linzess, samples of Trulance but has not given any of those significant period of time to work.  She does  not like the idea of being on something all the time or numerous medications  Past Medical History:  Diagnosis Date  . Abnormal Pap smear 1988   treated with cryo  . Anemia 11/2010   hematology consult prior; etiology malabsorption and uterine bleeding  . Anxiety   . Gastric polyp   . GERD (gastroesophageal reflux disease)   . H/O bone density study 12/2007  . H/O hysterectomy for benign disease 5/14  . History of echocardiogram 03/2010   normal LV function, EF 60-65%, mild left atrial enlargement  . HTN (hypertension) 03/2010   hospitalization for HTN urgency  . Hyperlipemia   . Internal hemorrhoids   . Lumbar degenerative disc disease   . Migraine   . Myalgia   . Numbness and tingling   . Spondylosis, cervical   . Umbilical hernia   . Uterine fibroid    hx/o     Current Outpatient Medications on File Prior to Visit  Medication Sig Dispense Refill  . acetaminophen (TYLENOL) 500 MG tablet Take 1 tablet (500 mg total) by mouth every 6 (six) hours as needed. 30 tablet 0  . ALPRAZolam (XANAX) 0.5 MG tablet Take 1 tablet (0.5 mg total) by mouth 2 (two) times daily as needed for anxiety. 45 tablet 1  . Ascorbic Acid (VITAMIN  C) 1000 MG tablet Take 1,000 mg by mouth daily.     Marland Kitchen atorvastatin (LIPITOR) 20 MG tablet TAKE 1 TABLET BY MOUTH EVERY DAY 90 tablet 3  . B Complex Vitamins (B COMPLEX PO) Take 1 tablet by mouth daily.     . calcium carbonate (OS-CAL) 600 MG TABS Take 600 mg by mouth daily.     Marland Kitchen CINNAMON PO Take by mouth.    . diclofenac sodium (VOLTAREN) 1 % GEL 3 grams to 3 large joints up to 3 times daily 3 Tube 3  . estradiol (VIVELLE-DOT) 0.0375 MG/24HR Place 1 patch onto the skin 2 (two) times a week. 24 patch 3  . fish oil-omega-3 fatty acids 1000 MG capsule Take 2 g by mouth daily.    . hydroxychloroquine (PLAQUENIL) 200 MG tablet TAKE 1 TABLET BY MOUTH TWICE DAILY MONDAY THROUGH FRIDAY 40 tablet 0  . losartan (COZAAR) 25 MG tablet TAKE 1 TABLET BY MOUTH EVERY DAY 90  tablet 1  . Multiple Vitamin (MULTIVITAMIN) tablet Take 1 tablet by mouth daily.     Marland Kitchen NEXIUM 40 MG capsule Take 40 mg by mouth daily.     . Probiotic Product (PROBIOTIC ADVANCED PO) Take 1 tablet by mouth daily.     . TURMERIC PO Take 1 tablet by mouth daily.      No current facility-administered medications on file prior to visit.   ROS as in subjective  Objective BP (!) 142/86   Pulse (!) 59   Ht 5\' 4"  (1.626 m)   Wt 121 lb 9.6 oz (55.2 kg)   LMP 10/25/2012 (Approximate)   SpO2 100%   BMI 20.87 kg/m   BP Readings from Last 3 Encounters:  11/13/19 (!) 142/86  10/17/19 134/80  10/03/19 116/68   General: Well-developed well-nourished no acute distress Psych: Pleasant, answers questions appropriately Mild tenderness in the palm of both hands otherwise nontender hands no swelling no deformity, normal range of motion of fingers and wrist, no decreased range of motion, no guarding.  No obvious signs of fracture.  No skin bruising or erythema    Assessment: Encounter Diagnoses  Name Primary?  . Chronic constipation Yes  . Gastroesophageal reflux disease without esophagitis   . Anxiety   . Caregiver burden   . Myofascial muscle pain   . High risk medication use   . Family history of rheumatoid arthritis   . Morning stiffness of joints   . Pain in both hands   . Fall, initial encounter   . Sleep disturbance   . Grieving      Plan: Chronic constipation-discussed various options for therapy, continue to work on improving water and fiber intake which she is not doing currently.  Gave a prescription for Amitiza and if she wants to pursue this.  We discussed risk benefits and proper use of medicine compared to other treatment modalities.  She is also going to try [place].  Follow-up in a few weeks to let me know if this is helping  Hand pain, fall-exam suggest bruising no specific worrisome finding.  No obvious concern for fracture.  We will hold off on x-ray at this time.   Discussed rest, gradual return to activity.  Sleep disturbance, insomnia-we discussed sleep hygiene,.  Lately she was sleeping during the day.  I advise she stop taking daytime naps.  We discussed sleep aids, potential risk of medications.  She is using 0.25 mg Xanax as needed sparingly.  We discussed possibly adding Belsomra or other  sleep aid if needed  Anxiety, caregiver burden-expressed sympathy for her loss of her father.  She is trying to get into a new schedule given that she is no longer having the caregiver her family.  She has already started to exercise some has plans to get back to some sort of routine.  Continue counseling.  We discussed possibly adding sertraline but she wants to think about this first.  She apparently did okay on sertraline in the past.  She does not want to continue Cymbalta so she stopped this due to it not really helping and due to family history of suicidal ideation on this medication  She continues to see rheumatology for myofascial muscle pain, autoimmune disease, morning stiffness and joint pain   Chelbi was seen today for follow-up, constipation and hand pain.  Diagnoses and all orders for this visit:  Chronic constipation  Gastroesophageal reflux disease without esophagitis  Anxiety  Caregiver burden  Myofascial muscle pain  High risk medication use  Family history of rheumatoid arthritis  Morning stiffness of joints  Pain in both hands  Fall, initial encounter  Sleep disturbance  Grieving  Other orders -     lubiprostone (AMITIZA) 24 MCG capsule; Take 1 capsule (24 mcg total) by mouth 2 (two) times daily with a meal.   F/u pending call back

## 2019-11-14 ENCOUNTER — Ambulatory Visit: Payer: BC Managed Care – PPO | Admitting: Rheumatology

## 2019-11-15 ENCOUNTER — Other Ambulatory Visit: Payer: Self-pay

## 2019-11-15 ENCOUNTER — Encounter: Payer: Self-pay | Admitting: Medical

## 2019-11-15 ENCOUNTER — Ambulatory Visit (INDEPENDENT_AMBULATORY_CARE_PROVIDER_SITE_OTHER): Payer: BC Managed Care – PPO | Admitting: Medical

## 2019-11-15 VITALS — BP 150/90 | HR 65 | Wt 123.0 lb

## 2019-11-15 DIAGNOSIS — R030 Elevated blood-pressure reading, without diagnosis of hypertension: Secondary | ICD-10-CM | POA: Diagnosis not present

## 2019-11-15 DIAGNOSIS — F419 Anxiety disorder, unspecified: Secondary | ICD-10-CM | POA: Diagnosis not present

## 2019-11-15 DIAGNOSIS — F410 Panic disorder [episodic paroxysmal anxiety] without agoraphobia: Secondary | ICD-10-CM

## 2019-11-15 DIAGNOSIS — F4321 Adjustment disorder with depressed mood: Secondary | ICD-10-CM | POA: Diagnosis not present

## 2019-11-15 MED ORDER — SERTRALINE HCL 50 MG PO TABS: 50.0000 mg | ORAL_TABLET | Freq: Every day | ORAL | 1 refills | Status: DC

## 2019-11-15 NOTE — Patient Instructions (Signed)
[facility]sel[facility] [address] [place], [place] [address] [phone]    [facility] [phone]  [email or web address] [address] [place], [place], [place] [address]  Rema Fendt, therapist  Or Toy Cookey, MA, clinical psychologist    Verneda Skill. Clarene Reamer, therapist [phone] [address] [place], [place] [address]   [facility] [phone] [address] [place], [place] [address]   Vic Ripper, therapist [phone] [address] [place], [place] [address]

## 2019-11-15 NOTE — Progress Notes (Signed)
Subjective:  Shari Lawrence is a 59 y.o. female who presents for Chief Complaint  Patient presents with  . Hypertension     Here today for blood pressure, panic attack.  I do saw her a few days ago for multiple concerns.  She came in a few days ago to talk about anxiety amongst other issues.  She did not want to necessarily start sertraline at that time that we had discussed it.  She is still grieving from her recent father's death.  She was his caretaker.  She notes today that she has had several friends and family died in the past year and some recently including an aunt that died 2 weeks before her father, her dad sister died recently and several friends have died within recent months.  She lately is doing with a lot of stuff on her mind in addition to grieving.  She is concerned about her finances, her overall outlook in life, her job, her physical wellbeing.  She tries to cope by taking walks with her daughter and friends.  She is seeing a grief counselor currently.  This morning she almost passed out when she got home.  She called 911 after feeling dizzy.  Her blood pressure was elevated around 170/90.  An EKG was done which was normal.  She brought this in.  They told her to follow-up with PCP about anxiety.  She eventually calmed down.  She has been taking her losartan regularly.  In the past her blood pressure would fluctuate too much of the nail with amlodipine.  She has also been on a diuretic in the past.  But she was allergic to sulfa so this was discontinued. no other aggravating or relieving factors.    No other c/o.  Past Medical History:  Diagnosis Date  . Abnormal Pap smear 1988   treated with cryo  . Anemia 11/2010   hematology consult prior; etiology malabsorption and uterine bleeding  . Anxiety   . Gastric polyp   . GERD (gastroesophageal reflux disease)   . H/O bone density study 12/2007  . H/O hysterectomy for benign disease 5/14  . History of echocardiogram  03/2010   normal LV function, EF 60-65%, mild left atrial enlargement  . HTN (hypertension) 03/2010   hospitalization for HTN urgency  . Hyperlipemia   . Internal hemorrhoids   . Lumbar degenerative disc disease   . Migraine   . Myalgia   . Numbness and tingling   . Spondylosis, cervical   . Umbilical hernia   . Uterine fibroid    hx/o    Current Outpatient Medications on File Prior to Visit  Medication Sig Dispense Refill  . acetaminophen (TYLENOL) 500 MG tablet Take 1 tablet (500 mg total) by mouth every 6 (six) hours as needed. 30 tablet 0  . ALPRAZolam (XANAX) 0.5 MG tablet Take 1 tablet (0.5 mg total) by mouth 2 (two) times daily as needed for anxiety. 45 tablet 1  . Ascorbic Acid (VITAMIN C) 1000 MG tablet Take 1,000 mg by mouth daily.     Marland Kitchen atorvastatin (LIPITOR) 20 MG tablet TAKE 1 TABLET BY MOUTH EVERY DAY 90 tablet 3  . B Complex Vitamins (B COMPLEX PO) Take 1 tablet by mouth daily.     . calcium carbonate (OS-CAL) 600 MG TABS Take 600 mg by mouth daily.     Marland Kitchen CINNAMON PO Take by mouth.    . diclofenac sodium (VOLTAREN) 1 % GEL 3 grams to 3 large joints  up to 3 times daily 3 Tube 3  . estradiol (VIVELLE-DOT) 0.0375 MG/24HR Place 1 patch onto the skin 2 (two) times a week. 24 patch 3  . fish oil-omega-3 fatty acids 1000 MG capsule Take 2 g by mouth daily.    . hydroxychloroquine (PLAQUENIL) 200 MG tablet TAKE 1 TABLET BY MOUTH TWICE DAILY MONDAY THROUGH FRIDAY 40 tablet 0  . losartan (COZAAR) 25 MG tablet TAKE 1 TABLET BY MOUTH EVERY DAY 90 tablet 1  . lubiprostone (AMITIZA) 24 MCG capsule Take 1 capsule (24 mcg total) by mouth 2 (two) times daily with a meal. 60 capsule 1  . Multiple Vitamin (MULTIVITAMIN) tablet Take 1 tablet by mouth daily.     Marland Kitchen NEXIUM 40 MG capsule Take 40 mg by mouth daily.     . Probiotic Product (PROBIOTIC ADVANCED PO) Take 1 tablet by mouth daily.     . TURMERIC PO Take 1 tablet by mouth daily.      No current facility-administered medications on  file prior to visit.     The following portions of the patient's history were reviewed and updated as appropriate: allergies, current medications, past family history, past medical history, past social history, past surgical history and problem list.  ROS Otherwise as in subjective above  Objective: BP (!) 150/90   Pulse 65   Wt 123 lb (55.8 kg)   LMP 10/25/2012 (Approximate)   SpO2 100%   BMI 21.11 kg/m   General appearance: alert, no distress, well developed, well nourished Psych: Pleasant, good eye contact, answers questions appropriate, but seems anxious or overwhelmed   Assessment: Encounter Diagnoses  Name Primary?  Marland Kitchen Anxiety Yes  . Grief   . Elevated blood pressure reading   . Panic attack      Plan: Given her recent death of her father which which she was caretaker of, given several other friends and family members that have died within the past year including several recently, given her overall outlook on life she is going through some anxiety and having some panic attacks.  She will go ahead and begin the sertraline as we discussed a few days ago.  I advise she establish with a primary counselor.  She is currently seeing just a grief counselor and the counseling is limited to survivorship.  However she is going to establish with a general therapist to help with her concerns in general.  I reviewed the EKG from EMS that came out to her house this morning.  No worrisome findings.  For now she will continue the same losartan.  She did not tolerate amlodipine in the past due to labile blood pressures on that medication.  Shari Lawrence was seen today for hypertension.  Diagnoses and all orders for this visit:  Anxiety  Grief  Elevated blood pressure reading  Panic attack  Other orders -     sertraline (ZOLOFT) 50 MG tablet; Take 1 tablet (50 mg total) by mouth daily.    Follow up: 2 weeks

## 2019-11-19 ENCOUNTER — Ambulatory Visit: Payer: BC Managed Care – PPO | Admitting: Family Medicine

## 2019-11-19 ENCOUNTER — Emergency Department (HOSPITAL_COMMUNITY)
Admission: EM | Admit: 2019-11-19 | Discharge: 2019-11-19 | Disposition: A | Discharge status: Home / Self Care | Payer: BC Managed Care – PPO | Attending: Emergency Medicine | Admitting: Emergency Medicine

## 2019-11-19 ENCOUNTER — Other Ambulatory Visit: Payer: Self-pay

## 2019-11-19 ENCOUNTER — Ambulatory Visit: Payer: BC Managed Care – PPO | Admitting: Neurology

## 2019-11-19 DIAGNOSIS — I10 Essential (primary) hypertension: Secondary | ICD-10-CM | POA: Insufficient documentation

## 2019-11-19 DIAGNOSIS — R531 Weakness: Secondary | ICD-10-CM | POA: Diagnosis present

## 2019-11-19 DIAGNOSIS — R42 Dizziness and giddiness: Secondary | ICD-10-CM | POA: Diagnosis not present

## 2019-11-19 DIAGNOSIS — R419 Unspecified symptoms and signs involving cognitive functions and awareness: Secondary | ICD-10-CM | POA: Diagnosis not present

## 2019-11-19 DIAGNOSIS — Z91040 Latex allergy status: Secondary | ICD-10-CM | POA: Diagnosis not present

## 2019-11-19 DIAGNOSIS — R61 Generalized hyperhidrosis: Secondary | ICD-10-CM | POA: Diagnosis not present

## 2019-11-19 DIAGNOSIS — Z79899 Other long term (current) drug therapy: Secondary | ICD-10-CM | POA: Diagnosis not present

## 2019-11-19 DIAGNOSIS — F419 Anxiety disorder, unspecified: Secondary | ICD-10-CM

## 2019-11-19 LAB — CBC WITH DIFFERENTIAL/PLATELET
Abs Immature Granulocytes: 0.01 10*3/uL (ref 0.00–0.07)
Basophils Absolute: 0 10*3/uL (ref 0.0–0.1)
Basophils Relative: 1 %
Eosinophils Absolute: 0.1 10*3/uL (ref 0.0–0.5)
Eosinophils Relative: 1 %
HCT: 43.8 % (ref 36.0–46.0)
Hemoglobin: 14.6 g/dL (ref 12.0–15.0)
Immature Granulocytes: 0 %
Lymphocytes Relative: 24 %
Lymphs Abs: 1.4 10*3/uL (ref 0.7–4.0)
MCH: 30.7 pg (ref 26.0–34.0)
MCHC: 33.3 g/dL (ref 30.0–36.0)
MCV: 92 fL (ref 80.0–100.0)
Monocytes Absolute: 0.4 10*3/uL (ref 0.1–1.0)
Monocytes Relative: 6 %
Neutro Abs: 3.9 10*3/uL (ref 1.7–7.7)
Neutrophils Relative %: 68 %
Platelets: 180 10*3/uL (ref 150–400)
RBC: 4.76 MIL/uL (ref 3.87–5.11)
RDW: 13.1 % (ref 11.5–15.5)
WBC: 5.8 10*3/uL (ref 4.0–10.5)
nRBC: 0 % (ref 0.0–0.2)

## 2019-11-19 LAB — COMPREHENSIVE METABOLIC PANEL
ALT: 25 U/L (ref 0–44)
AST: 37 U/L (ref 15–41)
Albumin: 5.1 g/dL — ABNORMAL HIGH (ref 3.5–5.0)
Alkaline Phosphatase: 69 U/L (ref 38–126)
Anion gap: 14 (ref 5–15)
BUN: 12 mg/dL (ref 6–20)
CO2: 22 mmol/L (ref 22–32)
Calcium: 10.2 mg/dL (ref 8.9–10.3)
Chloride: 106 mmol/L (ref 98–111)
Creatinine, Ser: 0.85 mg/dL (ref 0.44–1.00)
GFR calc Af Amer: >60 mL/min (ref >60)
GFR calc non Af Amer: >60 mL/min (ref >60)
Glucose, Bld: 102 mg/dL — ABNORMAL HIGH (ref 70–99)
Potassium: 4.3 mmol/L (ref 3.5–5.1)
Sodium: 142 mmol/L (ref 135–145)
Total Bilirubin: 0.7 mg/dL (ref 0.3–1.2)
Total Protein: 8.4 g/dL — ABNORMAL HIGH (ref 6.5–8.1)

## 2019-11-19 LAB — URINALYSIS, ROUTINE W REFLEX MICROSCOPIC
Bacteria, UA: NONE SEEN
Bilirubin Urine: NEGATIVE
Glucose, UA: NEGATIVE mg/dL
Ketones, ur: NEGATIVE mg/dL
Leukocytes,Ua: NEGATIVE
Nitrite: NEGATIVE
Protein, ur: NEGATIVE mg/dL
Specific Gravity, Urine: 1.004 — ABNORMAL LOW (ref 1.005–1.030)
pH: 8 (ref 5.0–8.0)

## 2019-11-19 LAB — T4, FREE: Free T4: 0.94 ng/dL (ref 0.61–1.12)

## 2019-11-19 LAB — TSH: TSH: 1.956 u[IU]/mL (ref 0.350–4.500)

## 2019-11-19 LAB — ETHANOL: Alcohol, Ethyl (B): <10 mg/dL (ref <10)

## 2019-11-19 MED ORDER — ESCITALOPRAM OXALATE 10 MG PO TABS
10.0000 mg | ORAL_TABLET | Freq: Every day | ORAL | 0 refills | Status: DC
Start: 2019-11-19 — End: 2020-01-09

## 2019-11-19 MED ORDER — LORAZEPAM 2 MG/ML IJ SOLN
1.0000 mg | Freq: Once | INTRAMUSCULAR | Status: AC
  Administered 2019-11-19: 1 mg via INTRAVENOUS
  Filled 2019-11-19: qty 1

## 2019-11-19 MED ORDER — SODIUM CHLORIDE 0.9 % IV BOLUS
1000.0000 mL | Freq: Once | INTRAVENOUS | Status: AC
  Administered 2019-11-19: 1000 mL via INTRAVENOUS

## 2019-11-19 MED ORDER — LORAZEPAM 0.5 MG PO TABS
0.5000 mg | ORAL_TABLET | Freq: Three times a day (TID) | ORAL | 0 refills | Status: DC | PRN
Start: 2019-11-19 — End: 2019-11-28

## 2019-11-19 NOTE — ED Notes (Signed)
Main phlebotomy called to collect labs.

## 2019-11-19 NOTE — ED Triage Notes (Signed)
Per ems, patient comes from physical therapy office, patient was driving there and experienced sudden onset weakness and dizziness. Denies pain. Patient reports she had same symptoms one week ago, her pcp attributed episode to anxiety.

## 2019-11-19 NOTE — Progress Notes (Deleted)
PATIENT: Shari Lawrence DOB: 08/05/1960  REASON FOR VISIT: follow up HISTORY FROM: patient  HISTORY OF PRESENT ILLNESS: Today 11/19/19  HISTORY Shari Lawrence a 59 year old female, seen in request byrheumatologist Dr.Deveshwar, [place] primary care physician Dr.Tysinger, [name], for evaluation of neck pain, radiating pain to right arm,initial evaluation was on April 08, 2019.  I have reviewed and summarized the referring note from the referring physician.She has past medical history of hypertension, hyperlipidemia, autoimmune disease, taking Plaquenil. She did have a history of cervical decompression surgery in 2010, prior to the surgery, she presented with right cervical radiculopathy.  Since April 2020, she began to notice recurrent right neck pain, radiating pain to right shoulder, right arm, sometimes woke her up from sleep, she is helping her elderly parents, sometimes bearing weight of her father with her right shoulder, in addition, she complains of intermittent bilateral lower extremity numbness since July 2020, denies significant gait abnormality, no bowel bladder incontinence.  She also complains of low back pain, but denies shooting pain to bilateral lower extremities.  Update May 22, 2019 SS: MRI of cervical spine April 18, 2019 showed solid fusion at C5-6, C6-7, degenerative changes at the rest of cervical levels  Mild central stenosis at C3-4, but no evidence of cord compression, bulging disc at C4-5, with central canal stenosis, minimum cord compression, moderate right and mild left foraminal stenosis, C6-C7, mild canal stenosis, minimum bilateral foraminal narrowing.  She indicates 60% improvement since last seen.  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  She is  not taking gabapentin consistently, in fact, rarely takes.  She reports continued pain to the base of her neck, on the left side, may radiate down the right arm, describes it as numbness, that can turn into pain.  It was daily, is now much better.  She is no longer having the numbness to her left leg.  She denies urinary or bowel incontinence. 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  She presents today for follow-up unaccompanied.  UpDate Nov 19, 2019 SS: Nerve conduction evaluation February 2021 showed carpal tunnel syndromes, right side is moderate, left side is mild, no evidence of right cervical radiculopathy  REVIEW OF SYSTEMS: Out of a complete 14 system review of symptoms, the patient complains only of the following symptoms, and all other reviewed systems are negative.  ALLERGIES: Allergies  Allergen Reactions  . Quinolones Anaphylaxis and Other (See Comments)    Whelps developed all over her body  . Latex Itching and Other (See Comments)    Skin yellowing  . Levaquin [Levofloxacin] Hives    Throat swelling  . Penicillins   . Percocet [Oxycodone-Acetaminophen]   . Sulfa Drugs Cross Reactors Itching, Swelling and Other (See Comments)    Red face  . Vicodin [Hydrocodone-Acetaminophen] Itching and Nausea And Vomiting  . Gluten Meal     Other reaction(s): Other (See Comments) IBS    HOME MEDICATIONS: Outpatient Medications Prior to Visit  Medication Sig Dispense Refill  . acetaminophen (TYLENOL) 500 MG tablet Take 1 tablet (500 mg total) by mouth every 6 (six) hours as needed. 30 tablet 0  . ALPRAZolam (XANAX) 0.5 MG tablet Take 1 tablet (0.5 mg total) by mouth 2 (two) times daily as needed for anxiety. 45 tablet 1  . Ascorbic Acid (VITAMIN C) 1000  MG tablet Take 1,000 mg by mouth daily.     Marland Kitchen atorvastatin (LIPITOR) 20 MG tablet TAKE 1 TABLET BY MOUTH EVERY DAY 90 tablet 3  . B Complex Vitamins (B COMPLEX PO) Take 1 tablet by mouth daily.     . calcium carbonate (OS-CAL) 600 MG TABS  Take 600 mg by mouth daily.     Marland Kitchen CINNAMON PO Take by mouth.    . diclofenac sodium (VOLTAREN) 1 % GEL 3 grams to 3 large joints up to 3 times daily 3 Tube 3  . estradiol (VIVELLE-DOT) 0.0375 MG/24HR Place 1 patch onto the skin 2 (two) times a week. 24 patch 3  . fish oil-omega-3 fatty acids 1000 MG capsule Take 2 g by mouth daily.    . hydroxychloroquine (PLAQUENIL) 200 MG tablet TAKE 1 TABLET BY MOUTH TWICE DAILY MONDAY THROUGH FRIDAY 40 tablet 0  . losartan (COZAAR) 25 MG tablet TAKE 1 TABLET BY MOUTH EVERY DAY 90 tablet 1  . lubiprostone (AMITIZA) 24 MCG capsule Take 1 capsule (24 mcg total) by mouth 2 (two) times daily with a meal. 60 capsule 1  . Multiple Vitamin (MULTIVITAMIN) tablet Take 1 tablet by mouth daily.     Marland Kitchen NEXIUM 40 MG capsule Take 40 mg by mouth daily.     . Probiotic Product (PROBIOTIC ADVANCED PO) Take 1 tablet by mouth daily.     . sertraline (ZOLOFT) 50 MG tablet Take 1 tablet (50 mg total) by mouth daily. 30 tablet 1  . TURMERIC PO Take 1 tablet by mouth daily.      No facility-administered medications prior to visit.    PAST MEDICAL HISTORY: Past Medical History:  Diagnosis Date  . Abnormal Pap smear 1988   treated with cryo  . Anemia 11/2010   hematology consult prior; etiology malabsorption and uterine bleeding  . Anxiety   . Gastric polyp   . GERD (gastroesophageal reflux disease)   . H/O bone density study 12/2007  . H/O hysterectomy for benign disease 5/14  . History of echocardiogram 03/2010   normal LV function, EF 60-65%, mild left atrial enlargement  . HTN (hypertension) 03/2010   hospitalization for HTN urgency  . Hyperlipemia   . Internal hemorrhoids   . Lumbar degenerative disc disease   . Migraine   . Myalgia   . Numbness and tingling   . Spondylosis, cervical   . Umbilical hernia   . Uterine fibroid    hx/o     PAST SURGICAL HISTORY: Past Surgical History:  Procedure Laterality Date  . ABDOMINAL HYSTERECTOMY    . CERVICAL FUSION   2010  . CERVICAL SPINE SURGERY    . CERVIX LESION DESTRUCTION  1988  . CESAREAN SECTION    . CHOLECYSTECTOMY  2003  . COLONOSCOPY  08/2010   Dr. Collene Mares  . COLONOSCOPY  02/2017   Dr. Collene Mares  . ESOPHAGOGASTRODUODENOSCOPY  08/2010   Dr. Collene Mares  . EXPLORATORY LAPAROTOMY    . HERNIA REPAIR    . MYOMECTOMY  1998  . PELVIC LAPAROSCOPY    . ROBOTIC ASSISTED LAPAROSCOPIC HYSTERECTOMY AND SALPINGECTOMY  10/2012   [facility]; laproscopic due to fibroids  . UMBILICAL HERNIA REPAIR  10/2013   infected laparoscopic port, mesh placement    FAMILY HISTORY: Family History  Problem Relation Age of Onset  . Hypertension Mother   . Dementia Mother   . Colon cancer Mother 69  . Deep vein thrombosis Father   . Hypertension Father   . Cancer  Father        prostate CA  . Heart disease Father 60       CAD  . Parkinsonism Father   . Hypertension Sister   . Hyperlipidemia Sister   . Hypertension Brother   . Hyperlipidemia Brother   . Hypertension Maternal Aunt   . Hyperlipidemia Maternal Aunt   . Rheum arthritis Maternal Aunt   . Diabetes Maternal Aunt        1 with type II, 1 with type 1  . Hypertension Brother   . Rheum arthritis Brother   . Diabetes Paternal Aunt        type II  . Breast cancer Neg Hx     SOCIAL HISTORY: Social History   Socioeconomic History  . Marital status: Divorced    Spouse name: Not on file  . Number of children: 1  . Years of education: college  . Highest education level: Doctorate  Occupational History    Employer: [employer]    Comment: molecular Production designer, theatre/television/film, PhD candidate, [employer]  Tobacco Use  . Smoking status: Never Smoker  . Smokeless tobacco: Never Used  Substance and Sexual Activity  . Alcohol use: No  . Drug use: No  . Sexual activity: Not Currently    Partners: Male    Birth control/protection: Surgical    Comment: hysterectomy  Other Topics Concern  . 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  Has 96 yo daughter.  Her siblings and her don't agree on helping take care of their parents, causes family tension.  As of 11/2014      Right-handed.   0.5 cup caffeine per day.   Her father lives with her.      Update 09/2019--father passed away from urosepsis in 10-11-2019   Social Determinants of Health   Financial Resource Strain:   . Difficulty of Paying Living Expenses:   Food Insecurity:   . Worried About Charity fundraiser in the Last Year:   . Arboriculturist in the Last Year:   Transportation Needs:   . Film/video editor (Medical):   Marland Kitchen Lack of Transportation (Non-Medical):   Physical Activity:   . Days of Exercise per Week:   . Minutes of Exercise per Session:   Stress:   . Feeling of Stress :   Social Connections:   . Frequency of Communication with Friends and Family:   . Frequency of Social Gatherings with Friends and Family:   . Attends Religious Services:   . Active Member of Clubs or Organizations:   . Attends Archivist Meetings:   Marland Kitchen Marital Status:   Intimate Partner Violence:   . Fear of Current or Ex-Partner:   . Emotionally Abused:   Marland Kitchen Physically Abused:   . Sexually Abused:       PHYSICAL EXAM  There were no vitals filed for this visit. There is no height or weight on file to calculate BMI.  Generalized: Well developed, in no acute distress   Neurological examination  Mentation: Alert oriented to time, place, history taking. Follows all commands speech and language fluent Cranial nerve II-XII: Pupils were equal round reactive to light. Extraocular movements were full, visual field were full on confrontational test. Facial  sensation and strength were normal. Uvula tongue midline. Head turning and shoulder shrug  were normal and  symmetric. Motor: The motor testing reveals 5 over 5 strength of all 4 extremities. Good symmetric motor tone is noted throughout.  Sensory: Sensory testing is intact to soft touch on all 4 extremities. No evidence of extinction is noted.  Coordination: Cerebellar testing reveals good finger-nose-finger and heel-to-shin bilaterally.  Gait and station: Gait is normal. Tandem gait is normal. Romberg is negative. No drift is seen.  Reflexes: Deep tendon reflexes are symmetric and normal bilaterally.   DIAGNOSTIC DATA (LABS, IMAGING, TESTING) - I reviewed patient records, labs, notes, testing and imaging myself where available.  Lab Results  Component Value Date   WBC 4.6 07/31/2019   HGB 12.7 07/31/2019   HCT 38.3 07/31/2019   MCV 92.3 07/31/2019   PLT 179 07/31/2019      Component Value Date/Time   NA 140 07/31/2019 1435   NA 141 05/21/2018 1633   K 4.2 07/31/2019 1435   CL 104 07/31/2019 1435   CO2 30 07/31/2019 1435   GLUCOSE 83 07/31/2019 1435   BUN 16 07/31/2019 1435   BUN 12 05/21/2018 1633   CREATININE 0.88 07/31/2019 1435   CALCIUM 9.7 07/31/2019 1435   PROT 6.9 07/31/2019 1435   PROT 6.6 05/21/2018 1633   ALBUMIN 4.5 05/21/2018 1633   AST 20 07/31/2019 1435   ALT 20 07/31/2019 1435   ALKPHOS 55 05/21/2018 1633   BILITOT 0.2 07/31/2019 1435   BILITOT 0.4 05/21/2018 1633   GFRNONAA 75 03/19/2019 1516   GFRAA 87 03/19/2019 1516   Lab Results  Component Value Date   CHOL 169 05/21/2018   HDL 77 05/21/2018   LDLCALC 82 05/21/2018   TRIG 52 05/21/2018   CHOLHDL 2.2 05/21/2018   Lab Results  Component Value Date   HGBA1C 6.0 (H) 05/21/2018   Lab Results  Component Value Date   VITAMINB12 486 05/06/2016   Lab Results  Component Value Date   TSH 0.86 07/31/2019      ASSESSMENT AND PLAN 59 y.o. year old female  has a past medical history of Abnormal Pap smear (1988), Anemia (11/2010), Anxiety, Gastric polyp, GERD (gastroesophageal reflux disease), H/O bone  density study (12/2007), H/O hysterectomy for benign disease (5/14), History of echocardiogram (03/2010), HTN (hypertension) (03/2010), Hyperlipemia, Internal hemorrhoids, Lumbar degenerative disc disease, Migraine, Myalgia, Numbness and tingling, Spondylosis, cervical, Umbilical hernia, and Uterine fibroid. here with ***   I spent 15 minutes with the patient. 50% of this time was spent   Butler Denmark, [place], DNP 11/19/2019, 5:48 AM [facility] [address] [place], [place] [address] [phone]

## 2019-11-19 NOTE — ED Provider Notes (Signed)
[facility] [place] [facility] DEPT Provider Note   CSN: [id number] Arrival date & time: 11/19/19  1108     History Chief Complaint  Patient presents with  . Weakness    Shari Lawrence is a 59 y.o. female.  HPI    Patient presents after an episode of dizziness and anxiousness. She was in usual state of health when she awoke this morning.  She notes that she was driving to a physical therapy appointment when she suddenly felt anxious, dizzy, but not vertiginous. No pain, no fever, no vomiting.  She arrived to her appointment and was sent here for further evaluation peer She notes a history of ongoing difficulty with blood pressure management, including changes in her medications. She also notes recent prescription for Zoloft which she has not yet begun to take.  In addition, patient has a history of Xanax prescription, but has seemingly been encouraged not to use this medication. Patient notes that her anxiousness, dizziness have been persistent, made worse by her recognition of elevated blood pressure readings. No vision loss, no syncope, no pain, no clearly with anything nor any other clear exacerbating factors. Past Medical History:  Diagnosis Date  . Abnormal Pap smear 1988   treated with cryo  . Anemia 11/2010   hematology consult prior; etiology malabsorption and uterine bleeding  . Anxiety   . Gastric polyp   . GERD (gastroesophageal reflux disease)   . H/O bone density study 12/2007  . H/O hysterectomy for benign disease 5/14  . History of echocardiogram 03/2010   normal LV function, EF 60-65%, mild left atrial enlargement  . HTN (hypertension) 03/2010   hospitalization for HTN urgency  . Hyperlipemia   . Internal hemorrhoids   . Lumbar degenerative disc disease   . Migraine   . Myalgia   . Numbness and tingling   . Spondylosis, cervical   . Umbilical hernia   . Uterine fibroid    hx/o     Patient Active Problem List   Diagnosis Date Noted   . Panic attack 11/15/2019  . Fall 11/13/2019  . Pain in both hands 11/13/2019  . Sleep disturbance 11/13/2019  . Grief 11/13/2019  . Bilateral carpal tunnel syndrome 08/21/2019  . Chronic pain of both knees 06/14/2019  . Right arm pain 06/14/2019  . Close exposure to COVID-19 virus 06/14/2019  . Paresthesia 04/08/2019  . Neck pain 03/30/2019  . Radicular pain in right arm 03/30/2019  . Numbness of leg 03/30/2019  . Acute stress reaction 07/24/2018  . Chest pain 07/24/2018  . Hypotension 07/17/2018  . Vertigo 07/17/2018  . Nausea 07/17/2018  . Vaccine counseling 05/21/2018  . Need for influenza vaccination 05/21/2018  . Routine general medical examination at a health care facility 05/21/2018  . Caregiver burden 07/05/2017  . Cyst of skin 07/05/2017  . Elevated blood pressure reading 06/28/2017  . Leg pain, diffuse, left 06/28/2017  . Leg swelling 06/28/2017  . Bruising 06/28/2017  . Myofascial muscle pain 06/28/2017  . High risk medication use 03/30/2017  . Chronic constipation 11/17/2016  . Globus sensation 11/17/2016  . Primary osteoarthritis of both knees 10/05/2016  . Autoimmune disease ([place]) 09/29/2016  . Primary osteoarthritis of both hands 09/29/2016  . Family history of lupus erythematosus 09/20/2016  . Polyarthralgia 05/06/2016  . Clenching of teeth 05/06/2016  . Finger swelling 05/06/2016  . Morning stiffness of joints 05/06/2016  . Myalgia 05/06/2016  . Family history of rheumatoid arthritis 05/06/2016  . Essential hypertension 12/23/2014  .  History of anemia 12/23/2014  . Care provider for parents 12/23/2014  . Post-operative state 11/13/2012  . Hyperlipidemia 11/11/2012  . Hypertension 11/11/2012  . Impaired fasting glucose 10/25/2011  . Gluten intolerance 10/25/2011  . Gastroesophageal reflux disease without esophagitis 10/25/2011  . Anxiety   . Iron deficiency anemia     Past Surgical History:  Procedure Laterality Date  . ABDOMINAL HYSTERECTOMY     . CERVICAL FUSION  2010  . CERVICAL SPINE SURGERY    . CERVIX LESION DESTRUCTION  1988  . CESAREAN SECTION    . CHOLECYSTECTOMY  2003  . COLONOSCOPY  08/2010   Dr. Collene Mares  . COLONOSCOPY  02/2017   Dr. Collene Mares  . ESOPHAGOGASTRODUODENOSCOPY  08/2010   Dr. Collene Mares  . EXPLORATORY LAPAROTOMY    . HERNIA REPAIR    . MYOMECTOMY  1998  . PELVIC LAPAROSCOPY    . ROBOTIC ASSISTED LAPAROSCOPIC HYSTERECTOMY AND SALPINGECTOMY  10/2012   [facility]; laproscopic due to fibroids  . UMBILICAL HERNIA REPAIR  10/2013   infected laparoscopic port, mesh placement     OB History    Gravida  1   Para  1   Term  1   Preterm  0   AB  0   Living  1     SAB  0   TAB  0   Ectopic  0   Multiple  0   Live Births  1           Family History  Problem Relation Age of Onset  . Hypertension Mother   . Dementia Mother   . Colon cancer Mother 45  . Deep vein thrombosis Father   . Hypertension Father   . Cancer Father        prostate CA  . Heart disease Father 68       CAD  . Parkinsonism Father   . Hypertension Sister   . Hyperlipidemia Sister   . Hypertension Brother   . Hyperlipidemia Brother   . Hypertension Maternal Aunt   . Hyperlipidemia Maternal Aunt   . Rheum arthritis Maternal Aunt   . Diabetes Maternal Aunt        1 with type II, 1 with type 1  . Hypertension Brother   . Rheum arthritis Brother   . Diabetes Paternal Aunt        type II  . Breast cancer Neg Hx     Social History   Tobacco Use  . Smoking status: Never Smoker  . Smokeless tobacco: Never Used  Substance Use Topics  . Alcohol use: No  . Drug use: No    Home Medications Prior to Admission medications   Medication Sig Start Date End Date Taking? Authorizing Provider  acetaminophen (TYLENOL) 500 MG tablet Take 1 tablet (500 mg total) by mouth every 6 (six) hours as needed. 04/01/17   Waynetta Pean, PA-C  ALPRAZolam Duanne Moron) 0.5 MG tablet Take 1 tablet (0.5 mg total) by mouth 2 (two) times daily as needed for  anxiety. 09/26/19   Rita Ohara, MD  Ascorbic Acid (VITAMIN C) 1000 MG tablet Take 1,000 mg by mouth daily.     [provider]  atorvastatin (LIPITOR) 20 MG tablet TAKE 1 TABLET BY MOUTH EVERY DAY 06/14/19   Tysinger, [name], PA-C  B Complex Vitamins (B COMPLEX PO) Take 1 tablet by mouth daily.     [provider]  calcium carbonate (OS-CAL) 600 MG TABS Take 600 mg by mouth daily.  [provider]  CINNAMON PO Take by mouth.    [provider]  diclofenac sodium (VOLTAREN) 1 % GEL 3 grams to 3 large joints up to 3 times daily 09/06/18   [name], MD  estradiol (VIVELLE-DOT) 0.0375 MG/24HR Place 1 patch onto the skin 2 (two) times a week. 06/06/19   [name], MD  fish oil-omega-3 fatty acids 1000 MG capsule Take 2 g by mouth daily.    [provider]  hydroxychloroquine (PLAQUENIL) 200 MG tablet TAKE 1 TABLET BY MOUTH TWICE DAILY MONDAY THROUGH FRIDAY 07/29/19   [name], MD  losartan (COZAAR) 25 MG tablet TAKE 1 TABLET BY MOUTH EVERY DAY 08/26/19   Tysinger, [name], PA-C  lubiprostone (AMITIZA) 24 MCG capsule Take 1 capsule (24 mcg total) by mouth 2 (two) times daily with a meal. 11/13/19   Tysinger, [name], PA-C  Multiple Vitamin (MULTIVITAMIN) tablet Take 1 tablet by mouth daily.     [provider]  NEXIUM 40 MG capsule Take 40 mg by mouth daily.  03/13/15   [provider]  Probiotic Product (PROBIOTIC ADVANCED PO) Take 1 tablet by mouth daily.     [provider]  sertraline (ZOLOFT) 50 MG tablet Take 1 tablet (50 mg total) by mouth daily. 11/15/19   Tysinger, [name], PA-C  TURMERIC PO Take 1 tablet by mouth daily.     [provider]    Allergies    Quinolones, Latex, Levaquin [levofloxacin], Penicillins, Percocet [oxycodone-acetaminophen], Sulfa drugs cross reactors, Vicodin [hydrocodone-acetaminophen], and Gluten meal  Review of Systems   Review of Systems  Constitutional:        Per HPI, otherwise negative  HENT:       Per HPI, otherwise negative  Respiratory:       Per HPI, otherwise negative  Cardiovascular:       Per HPI, otherwise negative  Gastrointestinal: Negative for vomiting.  Endocrine:       Negative aside from HPI  Genitourinary:       Neg aside from HPI   Musculoskeletal:       Per HPI, otherwise negative  Skin: Negative.   Neurological: Negative for syncope.  Psychiatric/Behavioral: The patient is nervous/anxious.     Physical Exam Updated Vital Signs BP (!) 153/103 (BP Location: Right Arm)   Pulse 70   Temp 98.4 F (36.9 C) (Oral)   Resp 16   Ht 5\' 4"  (1.626 m)   Wt 55.8 kg   LMP 10/25/2012 (Approximate)   SpO2 100%   BMI 21.11 kg/m   Physical Exam Vitals and nursing note reviewed.  Constitutional:      General: She is not in acute distress.    Appearance: She is well-developed.  HENT:     Head: Normocephalic and atraumatic.  Eyes:     Conjunctiva/sclera: Conjunctivae normal.  Cardiovascular:     Rate and Rhythm: Normal rate and regular rhythm.  Pulmonary:     Effort: Pulmonary effort is normal. No respiratory distress.     Breath sounds: Normal breath sounds. No stridor.  Abdominal:     General: There is no distension.  Skin:    General: Skin is warm and dry.  Neurological:     Mental Status: She is alert and oriented to person, place, and time.     Cranial Nerves: No cranial nerve deficit.  Psychiatric:        Mood and Affect: Mood is anxious.     ED Results /  Procedures / Treatments   Labs (all labs ordered are listed, but only abnormal results are displayed) Labs Reviewed  COMPREHENSIVE METABOLIC PANEL  ETHANOL  CBC WITH DIFFERENTIAL/PLATELET  URINALYSIS, ROUTINE W REFLEX MICROSCOPIC    EKG Sinus tachycardia, rate 135, left atrial enlargement, left axis deviation, abnormal EKG Radiology No results found.  Procedures Procedures (including critical care time)  Medications Ordered in  ED Medications  sodium chloride 0.9 % bolus 1,000 mL (has no administration in time range)  LORazepam (ATIVAN) injection 1 mg (has no administration in time range)    ED Course  I have reviewed the triage vital signs and the nursing notes.  Pertinent labs & imaging results that were available during my care of the patient were reviewed by me and considered in my medical decision making (see chart for details).    1:34 PM Nurse called to bedside as the patient was particularly anxious.  On exam the patient is awake, alert, speaking anxiously, tachycardic, with a sinus tachycardia, rate 130s on the monitor.  She had not yet received her initially prescribed Ativan.  4:42 PM Patient substantially better, heart rate in the 70s.  She is awake, alert, calm, has no ongoing complaints. She has received fluids, Ativan.  We reviewed initial labs, reassuring, with no substantial abnormalities.  Initial thyroid panel also reassuring. Now in discussing her history, as per the patient has previously been on S-Citalopram, and has a preference for this rather than the recently prescribed Zoloft for her anxiolytic. She also has essentially run out of her Xanax, which will be used as a bridge medication until the escitalopram is up to full speed.   This adult female presents after episode of dizziness, anxiousness, diaphoresis. Patient is awake, alert, tachycardic, but hemodynamically unremarkable initially, but does have one episode of sustained tachycardia, diaphoresis, anxiousness in the emergency department. Patient has previously been on SSRI, there is some suspicion for anxiousness contributing to today's episode given the otherwise reassuring neurologic exam, vital signs, labs.  Patient's improvement here with Ativan substantiates this consideration. With otherwise reassuring findings, the patient is amenable to outpatient follow-up after initiation of anxiolytic, benzodiazepine in the  short-term. Final Clinical Impression(s) / ED Diagnoses Final diagnoses:  Dizziness  Anxiousness    Rx / DC Orders ED Discharge Orders         Ordered    LORazepam (ATIVAN) 0.5 MG tablet  Every 8 hours PRN     11/19/19 1647    escitalopram (LEXAPRO) 10 MG tablet  Daily     11/19/19 1647           Carmin Muskrat, MD 11/19/19 1647

## 2019-11-19 NOTE — Discharge Instructions (Signed)
As discussed, your evaluation today has been largely reassuring.  But, it is important that you monitor your condition carefully, and do not hesitate to return to the ED if you develop new, or concerning changes in your condition. ? ?Otherwise, please follow-up with your physician for appropriate ongoing care. ? ?

## 2019-11-20 ENCOUNTER — Encounter: Payer: Self-pay | Admitting: Medical

## 2019-11-20 ENCOUNTER — Telehealth: Payer: Self-pay

## 2019-11-20 ENCOUNTER — Ambulatory Visit: Payer: BC Managed Care – PPO | Admitting: Medical

## 2019-11-20 VITALS — BP 150/94 | HR 75 | Ht 64.0 in | Wt 121.6 lb

## 2019-11-20 DIAGNOSIS — F410 Panic disorder [episodic paroxysmal anxiety] without agoraphobia: Secondary | ICD-10-CM | POA: Diagnosis not present

## 2019-11-20 DIAGNOSIS — Z8659 Personal history of other mental and behavioral disorders: Secondary | ICD-10-CM | POA: Diagnosis not present

## 2019-11-20 DIAGNOSIS — R002 Palpitations: Secondary | ICD-10-CM

## 2019-11-20 DIAGNOSIS — F419 Anxiety disorder, unspecified: Secondary | ICD-10-CM

## 2019-11-20 DIAGNOSIS — I10 Essential (primary) hypertension: Secondary | ICD-10-CM | POA: Diagnosis not present

## 2019-11-20 NOTE — Telephone Encounter (Signed)
Patient lmom to have her antidepressant changed. She was familiar with Escitalopram. She has another episode with her blood pressure and she was in the hospital as of yesterday. Please advise.

## 2019-11-20 NOTE — Progress Notes (Addendum)
Subjective:  Chief Complaint  Patient presents with  . Follow-up   Here for emergency department follow-up.  Of note I saw her 11/15/2019 regarding multiple symptoms and anxiety.  Per emergency department notes, she went to the emergency department yesterday after a period of dizziness and anxiousness.  She was driving to a physical therapy appointment, felt anxious dizzy.  She had not started Zoloft which was discussed here last visit (Sertraline as a prior preference that she voiced).  She has become more anxious worried about her elevated blood pressure readings.  She denies vision loss, syncope, pain, or any other aggravating factors.  In the emergency department her anxiety and symptoms improved with Ativan anxiolytic.  Contrary to what we discussed here in the office a few days ago, there apparently was a discussion she had voiced preference for escitalopram instead of sertraline.  She notes prior use of Escitalopram for a month in 2019 and did ok on that one.    Today patient notes being in physical therapy yesterday and her blood pressure was found to be elevated, pulse rate in the 140s.  They tried to get her to relax and calm but the numbers never improved. This got her anxious which also prompted the emergency department visit. She notes compliance in taking the losartan but still feels like this isn't controlling BP.  In the past she didn't tolerate amlodipine.   She is scared to take anything that is going to drop her pulse or BP too low as well.   Past Medical History:  Diagnosis Date  . Abnormal Pap smear 1988   treated with cryo  . Anemia 11/2010   hematology consult prior; etiology malabsorption and uterine bleeding  . Anxiety   . Gastric polyp   . GERD (gastroesophageal reflux disease)   . H/O bone density study 12/2007  . H/O hysterectomy for benign disease 5/14  . History of echocardiogram 03/2010   normal LV function, EF 60-65%, mild left atrial enlargement  . HTN  (hypertension) 03/2010   hospitalization for HTN urgency  . Hyperlipemia   . Internal hemorrhoids   . Lumbar degenerative disc disease   . Migraine   . Myalgia   . Numbness and tingling   . Spondylosis, cervical   . Umbilical hernia   . Uterine fibroid    hx/o    Current Outpatient Medications on File Prior to Visit  Medication Sig Dispense Refill  . acetaminophen (TYLENOL) 500 MG tablet Take 1 tablet (500 mg total) by mouth every 6 (six) hours as needed. 30 tablet 0  . Ascorbic Acid (VITAMIN C) 1000 MG tablet Take 1,000 mg by mouth daily.     Marland Kitchen aspirin EC 81 MG tablet Take 81 mg by mouth daily.    Marland Kitchen atorvastatin (LIPITOR) 20 MG tablet TAKE 1 TABLET BY MOUTH EVERY DAY (Patient taking differently: Take 20 mg by mouth daily. ) 90 tablet 3  . B Complex Vitamins (B COMPLEX PO) Take 1 tablet by mouth daily.     . calcium carbonate (OS-CAL) 600 MG TABS Take 600 mg by mouth daily.     Marland Kitchen CINNAMON PO Take by mouth.    . diclofenac sodium (VOLTAREN) 1 % GEL 3 grams to 3 large joints up to 3 times daily (Patient taking differently: Apply 3 g topically 3 (three) times daily as needed (pain). ) 3 Tube 3  . escitalopram (LEXAPRO) 10 MG tablet Take 1 tablet (10 mg total) by mouth daily. 30 tablet 0  .  estradiol (VIVELLE-DOT) 0.0375 MG/24HR Place 1 patch onto the skin 2 (two) times a week. 24 patch 3  . fish oil-omega-3 fatty acids 1000 MG capsule Take 2 g by mouth daily.    . hydroxychloroquine (PLAQUENIL) 200 MG tablet TAKE 1 TABLET BY MOUTH TWICE DAILY MONDAY THROUGH FRIDAY (Patient taking differently: Take 200 mg by mouth See admin instructions. Monday-Friday only) 40 tablet 0  . LORazepam (ATIVAN) 0.5 MG tablet Take 1 tablet (0.5 mg total) by mouth every 8 (eight) hours as needed for anxiety. 15 tablet 0  . losartan (COZAAR) 25 MG tablet TAKE 1 TABLET BY MOUTH EVERY DAY (Patient taking differently: Take 25 mg by mouth daily. ) 90 tablet 1  . lubiprostone (AMITIZA) 24 MCG capsule Take 1 capsule (24  mcg total) by mouth 2 (two) times daily with a meal. 60 capsule 1  . Multiple Vitamin (MULTIVITAMIN) tablet Take 1 tablet by mouth daily.     Marland Kitchen NEXIUM 40 MG capsule Take 40 mg by mouth daily.     . Probiotic Product (PROBIOTIC ADVANCED PO) Take 1 tablet by mouth daily.     . Prucalopride Succinate (MOTEGRITY PO) Take by mouth. Sample Pack    . TURMERIC PO Take 1 tablet by mouth daily.     . [DISCONTINUED] sertraline (ZOLOFT) 50 MG tablet Take 1 tablet (50 mg total) by mouth daily. 30 tablet 1   No current facility-administered medications on file prior to visit.   ROS as in subjective   Objective: BP (!) 150/94   Pulse 75   Ht 5\' 4"  (1.626 m)   Wt 121 lb 9.6 oz (55.2 kg)   LMP 10/25/2012 (Approximate)   SpO2 99%   BMI 20.87 kg/m   BP Readings from Last 3 Encounters:  11/20/19 (!) 150/94  11/19/19 136/86  11/15/19 (!) 150/90   Wt Readings from Last 3 Encounters:  11/20/19 121 lb 9.6 oz (55.2 kg)  11/19/19 123 lb (55.8 kg)  11/15/19 123 lb (55.8 kg)    General appearence: alert, no distress, WD/WN, anxious appearing Heart: RRR, normal S1, S2, no murmurs Lungs: CTA bilaterally, no wheezes, rhonchi, or rales Pulses: 2+ symmetric, upper and lower extremities, normal cap refill Ext: no edema     Assessment: Encounter Diagnoses  Name Primary?  Marland Kitchen Anxiety Yes  . Panic attack   . Essential hypertension   . History of recent stressful life event   . Palpitation      Plan: I reviewed the emergency department notes from yesterday.  I also look back over our discussion from the May 21 office visit.  She had told me at her recent office visit here that she preferred sertraline that had worked well for her in the past.  However this was a different than the conversation she had with the emergency department voicing preference for citalopram.  In general she has always voiced to me that she does not like taking a bunch of pills and does not want to be on a much medication.  I  have talked to her several times in the past about using Xanax as needed to calm her nerves particularly with panic attacks.  We have discussed addiction potential prior.   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  She is going through a lot of  stress.  She will continue Escitalopram started yesterday.  She will use Lorazepam as needed for now, given by the ED yesterday.   Again encouraged her to establish with counseling.   High blood pressure, palpitations - prior intolerance to amlodipine, doesn't seem to be tolerating Losartan.  She is afraid of beta blocker due to her average baseline pulse being under 70.   Given her concern about palpations and potential for adverse effect, referral to cardiology for event monitor, further eval and management of BP and pulse.  I suspect anxiety is the main underlying issue, but referral as discussed today   Airyonna was seen today for follow-up.  Diagnoses and all orders for this visit:  Anxiety -     Ambulatory referral to Cardiology  Panic attack -     Ambulatory referral to Cardiology  Essential hypertension -     Ambulatory referral to Cardiology  History of recent stressful life event -     Ambulatory referral to Cardiology  Palpitation -     Ambulatory referral to Cardiology

## 2019-11-20 NOTE — Telephone Encounter (Signed)
Patient is coming in the office.

## 2019-11-21 ENCOUNTER — Telehealth: Payer: Self-pay | Admitting: Medical

## 2019-11-21 ENCOUNTER — Other Ambulatory Visit: Payer: Self-pay | Admitting: Medical

## 2019-11-21 MED ORDER — METOPROLOL TARTRATE 25 MG PO TABS
25.0000 mg | ORAL_TABLET | Freq: Two times a day (BID) | ORAL | 0 refills | Status: DC
Start: 2019-11-21 — End: 2019-11-28

## 2019-11-21 NOTE — Telephone Encounter (Signed)
Patient informed. 

## 2019-11-21 NOTE — Telephone Encounter (Signed)
I sent Metoprolol 25mg , 1 tablet twice daily to replace Losartan blood pressure medication  Follow up with cardiology as planned for BP, palpitations, dizziness  Continue Escitalopram daily, Ativan prn

## 2019-11-21 NOTE — Telephone Encounter (Signed)
Please give her the number.  Not sure what appt date we were able to secure

## 2019-11-21 NOTE — Telephone Encounter (Signed)
Patient was provided the number to cardio. They informed her of the standard time for appointments being 3-5 days. Patient stated her heart rate has goes to 80 something and 90 when she gets up to got to the bathroom. Stated she checked her blood pressure 2x this morning 30 minutes apart and the first one was 160/98 and the second one was 172/98. Patient has asked if she can increase her ativan and if she can double up on her blood pressure meds. Provider has advised that patient can take ativan twice daily of the same dose she was prescribed and should not double up on current blood pressure medication before seeing cardio. Provider stated that he can give beta blocker and discussed with patient at her visit that can help regulate blood pressure and heart rate. Patient has been informed of provider response and has decided that after talking to her brother whom is a pharmacist that she should take the beta blocker.   Shane please send beta blocker to the pharmacy for patient. Thanks.

## 2019-11-21 NOTE — Telephone Encounter (Signed)
Pt called and said she is still dizzy and light headed when she stands. Pt declined office visit and stated she was just here yesterday. She wanted the number to the cardiologist she was referred to. Number given

## 2019-11-22 ENCOUNTER — Emergency Department (HOSPITAL_COMMUNITY): Payer: BC Managed Care – PPO

## 2019-11-22 ENCOUNTER — Emergency Department (HOSPITAL_COMMUNITY)
Admission: EM | Admit: 2019-11-22 | Discharge: 2019-11-22 | Disposition: A | Discharge status: Home / Self Care | Payer: BC Managed Care – PPO | Attending: Emergency Medicine | Admitting: Emergency Medicine

## 2019-11-22 ENCOUNTER — Other Ambulatory Visit: Payer: Self-pay

## 2019-11-22 DIAGNOSIS — R109 Unspecified abdominal pain: Secondary | ICD-10-CM | POA: Diagnosis not present

## 2019-11-22 DIAGNOSIS — Z79899 Other long term (current) drug therapy: Secondary | ICD-10-CM | POA: Diagnosis not present

## 2019-11-22 DIAGNOSIS — R0789 Other chest pain: Secondary | ICD-10-CM | POA: Diagnosis not present

## 2019-11-22 DIAGNOSIS — K5900 Constipation, unspecified: Secondary | ICD-10-CM | POA: Diagnosis not present

## 2019-11-22 DIAGNOSIS — R42 Dizziness and giddiness: Secondary | ICD-10-CM | POA: Diagnosis not present

## 2019-11-22 DIAGNOSIS — R000 Tachycardia, unspecified: Secondary | ICD-10-CM | POA: Diagnosis not present

## 2019-11-22 DIAGNOSIS — M542 Cervicalgia: Secondary | ICD-10-CM | POA: Diagnosis not present

## 2019-11-22 DIAGNOSIS — Z7982 Long term (current) use of aspirin: Secondary | ICD-10-CM | POA: Diagnosis not present

## 2019-11-22 DIAGNOSIS — I10 Essential (primary) hypertension: Secondary | ICD-10-CM | POA: Diagnosis not present

## 2019-11-22 DIAGNOSIS — R0989 Other specified symptoms and signs involving the circulatory and respiratory systems: Secondary | ICD-10-CM | POA: Insufficient documentation

## 2019-11-22 DIAGNOSIS — Z91040 Latex allergy status: Secondary | ICD-10-CM | POA: Diagnosis not present

## 2019-11-22 DIAGNOSIS — R002 Palpitations: Secondary | ICD-10-CM | POA: Diagnosis not present

## 2019-11-22 DIAGNOSIS — R12 Heartburn: Secondary | ICD-10-CM | POA: Insufficient documentation

## 2019-11-22 LAB — CBC
HCT: 38.1 % (ref 36.0–46.0)
Hemoglobin: 12.7 g/dL (ref 12.0–15.0)
MCH: 30.4 pg (ref 26.0–34.0)
MCHC: 33.3 g/dL (ref 30.0–36.0)
MCV: 91.1 fL (ref 80.0–100.0)
Platelets: 166 10*3/uL (ref 150–400)
RBC: 4.18 MIL/uL (ref 3.87–5.11)
RDW: 13 % (ref 11.5–15.5)
WBC: 5.1 10*3/uL (ref 4.0–10.5)
nRBC: 0 % (ref 0.0–0.2)

## 2019-11-22 LAB — BASIC METABOLIC PANEL
Anion gap: 13 (ref 5–15)
BUN: 10 mg/dL (ref 6–20)
CO2: 26 mmol/L (ref 22–32)
Calcium: 10.1 mg/dL (ref 8.9–10.3)
Chloride: 102 mmol/L (ref 98–111)
Creatinine, Ser: 0.83 mg/dL (ref 0.44–1.00)
GFR calc Af Amer: >60 mL/min (ref >60)
GFR calc non Af Amer: >60 mL/min (ref >60)
Glucose, Bld: 119 mg/dL — ABNORMAL HIGH (ref 70–99)
Potassium: 3.7 mmol/L (ref 3.5–5.1)
Sodium: 141 mmol/L (ref 135–145)

## 2019-11-22 LAB — BRAIN NATRIURETIC PEPTIDE: B Natriuretic Peptide: 17.4 pg/mL (ref 0.0–100.0)

## 2019-11-22 LAB — TROPONIN I (HIGH SENSITIVITY)
Troponin I (High Sensitivity): 4 ng/L (ref <18)
Troponin I (High Sensitivity): 5 ng/L (ref <18)

## 2019-11-22 LAB — D-DIMER, QUANTITATIVE: D-Dimer, Quant: 0.31 ug/mL-FEU (ref 0.00–0.50)

## 2019-11-22 MED ORDER — LORAZEPAM 1 MG PO TABS
1.0000 mg | ORAL_TABLET | Freq: Once | ORAL | Status: AC
  Administered 2019-11-22: 1 mg via ORAL
  Filled 2019-11-22: qty 1

## 2019-11-22 NOTE — ED Provider Notes (Signed)
[facility]PART[facility] Provider Note   CSN: [id number] Arrival date & time: 11/22/19  0016     History Chief Complaint  Patient presents with  . Hypertension   Shari Lawrence is a 59 y.o. female with history of GERD, constipation, HTN, HLD presents to ER for evaluation of chest tightness.  Onset last night around midnight while she was in bed.  The chest tightness has been constant.  Has also noticed tightness in her neck.  States if she tries to open up her chest and get a good breathing rhythm it seems to "release" the tightness she feels.  Feels like if she is sitting up the tightness feels better than if she lays down.  Has the C2 bulging disc in her neck and states she has always had to sleep with one pillow underneath her head, unchanged.  She cannot think of anything that makes it worse.  No leg swelling, calf pain. Reports associated lightheadedness and palpitations that she describes as "pounding" in her chest at rest and with exertion, but no CP or SOB. States 1.5 weeks ago noticed her blood pressure was higher than normal, 190s/110s.  Also noticed pounding in her chest with generalized warm feeling all over her body.  States palpitations are now more severe.  They are random and they sometimes occur while she is asleep and they wake her up, sometimes on exertion.  Her PCP saw her recently and recommended metoprolol but she was concerned about this since her resting heart rate is sometimes as low as 56, she did not take it.  Did not take losartan today, doesn't know if she can take both at same time.  States in the last couple of years her blood pressure has been well controlled but occasionally it is elevated and calls it "situational".   Patient has been seen by her PCP this week for elevated heart rate and palpitations.  She went to [facility] ER 3 days ago for episode of light headedness, palpitations.  While in the [facility] ER three days ago her heart rate went up to  the 140s.  Her work-up there was normal and patient states provider thought it was related to her fast breathing and anxiety may have contributed as well.  She was discharged with Ativan which she has taken as needed the last 3 days, states ativan has helped her palpitations but states last night the chest tightness and palpitations did not improve with ativan.  Called PCP yesterday who called in metoprolol and encouraged her to follow up with cardiology.  Cardiology has 5 day wait, waiting for call back.  Recently started lexapro 3 days ago.  Last stress test 20 years ago was normal.  Estradiol patch for hot flashes.   Personal history of HTN, HLD.  Father had CAD.  No tobacco use ever.  Denies fever. No new cough, leg edema.  History of GERD takes nexium, feels like it has been more problematic than usual, acid in her throat.  H/o constipation, seen by GI, get occasional sharp pains in abdomen but not any worse. GI prescribed her new medicine Tuesday but has not picked it up yet.  Last BM yesterday, no nausea, vomiting, diarrhea.  H/o hernia repair.   HPI     Past Medical History:  Diagnosis Date  . Abnormal Pap smear 1988   treated with cryo  . Anemia 11/2010   hematology consult prior; etiology malabsorption and uterine bleeding  . Anxiety   .  Gastric polyp   . GERD (gastroesophageal reflux disease)   . H/O bone density study 12/2007  . H/O hysterectomy for benign disease 5/14  . History of echocardiogram 03/2010   normal LV function, EF 60-65%, mild left atrial enlargement  . HTN (hypertension) 03/2010   hospitalization for HTN urgency  . Hyperlipemia   . Internal hemorrhoids   . Lumbar degenerative disc disease   . Migraine   . Myalgia   . Numbness and tingling   . Spondylosis, cervical   . Umbilical hernia   . Uterine fibroid    hx/o     Patient Active Problem List   Diagnosis Date Noted  . History of recent stressful life event 11/20/2019  . Panic attack 11/15/2019  .  Fall 11/13/2019  . Pain in both hands 11/13/2019  . Sleep disturbance 11/13/2019  . Grief 11/13/2019  . Bilateral carpal tunnel syndrome 08/21/2019  . Chronic pain of both knees 06/14/2019  . Right arm pain 06/14/2019  . Close exposure to COVID-19 virus 06/14/2019  . Paresthesia 04/08/2019  . Neck pain 03/30/2019  . Radicular pain in right arm 03/30/2019  . Numbness of leg 03/30/2019  . Acute stress reaction 07/24/2018  . Chest pain 07/24/2018  . Hypotension 07/17/2018  . Vertigo 07/17/2018  . Nausea 07/17/2018  . Vaccine counseling 05/21/2018  . Need for influenza vaccination 05/21/2018  . Routine general medical examination at a health care facility 05/21/2018  . Caregiver burden 07/05/2017  . Cyst of skin 07/05/2017  . Elevated blood pressure reading 06/28/2017  . Leg pain, diffuse, left 06/28/2017  . Leg swelling 06/28/2017  . Bruising 06/28/2017  . Myofascial muscle pain 06/28/2017  . High risk medication use 03/30/2017  . Chronic constipation 11/17/2016  . Globus sensation 11/17/2016  . Primary osteoarthritis of both knees 10/05/2016  . Autoimmune disease ([employer]) 09/29/2016  . Primary osteoarthritis of both hands 09/29/2016  . Family history of lupus erythematosus 09/20/2016  . Polyarthralgia 05/06/2016  . Clenching of teeth 05/06/2016  . Finger swelling 05/06/2016  . Morning stiffness of joints 05/06/2016  . Myalgia 05/06/2016  . Family history of rheumatoid arthritis 05/06/2016  . Essential hypertension 12/23/2014  . History of anemia 12/23/2014  . Care provider for parents 12/23/2014  . Post-operative state 11/13/2012  . Hyperlipidemia 11/11/2012  . Hypertension 11/11/2012  . Impaired fasting glucose 10/25/2011  . Gluten intolerance 10/25/2011  . Gastroesophageal reflux disease without esophagitis 10/25/2011  . Anxiety   . Iron deficiency anemia     Past Surgical History:  Procedure Laterality Date  . ABDOMINAL HYSTERECTOMY    . CERVICAL FUSION  2010  .  CERVICAL SPINE SURGERY    . CERVIX LESION DESTRUCTION  1988  . CESAREAN SECTION    . CHOLECYSTECTOMY  2003  . COLONOSCOPY  08/2010   Dr. Collene Mares  . COLONOSCOPY  02/2017   Dr. Collene Mares  . ESOPHAGOGASTRODUODENOSCOPY  08/2010   Dr. Collene Mares  . EXPLORATORY LAPAROTOMY    . HERNIA REPAIR    . MYOMECTOMY  1998  . PELVIC LAPAROSCOPY    . ROBOTIC ASSISTED LAPAROSCOPIC HYSTERECTOMY AND SALPINGECTOMY  10/2012   [facility]; laproscopic due to fibroids  . UMBILICAL HERNIA REPAIR  10/2013   infected laparoscopic port, mesh placement     OB History    Gravida  1   Para  1   Term  1   Preterm  0   AB  0   Living  1     SAB  0   TAB  0   Ectopic  0   Multiple  0   Live Births  1           Family History  Problem Relation Age of Onset  . Hypertension Mother   . Dementia Mother   . Colon cancer Mother 49  . Deep vein thrombosis Father   . Hypertension Father   . Cancer Father        prostate CA  . Heart disease Father 17       CAD  . Parkinsonism Father   . Hypertension Sister   . Hyperlipidemia Sister   . Hypertension Brother   . Hyperlipidemia Brother   . Hypertension Maternal Aunt   . Hyperlipidemia Maternal Aunt   . Rheum arthritis Maternal Aunt   . Diabetes Maternal Aunt        1 with type II, 1 with type 1  . Hypertension Brother   . Rheum arthritis Brother   . Diabetes Paternal Aunt        type II  . Breast cancer Neg Hx     Social History   Tobacco Use  . Smoking status: Never Smoker  . Smokeless tobacco: Never Used  Substance Use Topics  . Alcohol use: No  . Drug use: No    Home Medications Prior to Admission medications   Medication Sig Start Date End Date Taking? Authorizing Provider  acetaminophen (TYLENOL) 500 MG tablet Take 1 tablet (500 mg total) by mouth every 6 (six) hours as needed. 04/01/17  Yes Waynetta Pean, PA-C  Ascorbic Acid (VITAMIN C) 1000 MG tablet Take 1,000 mg by mouth daily.    Yes [provider]  aspirin EC 81 MG tablet Take  81 mg by mouth daily.   Yes [provider]  atorvastatin (LIPITOR) 20 MG tablet TAKE 1 TABLET BY MOUTH EVERY DAY Patient taking differently: Take 20 mg by mouth daily.  06/14/19  Yes Tysinger, [name], PA-C  B Complex Vitamins (B COMPLEX PO) Take 1 tablet by mouth daily.    Yes [provider]  calcium carbonate (OS-CAL) 600 MG TABS Take 600 mg by mouth daily.    Yes [provider]  CINNAMON PO Take 1 tablet by mouth daily.    Yes [provider]  diclofenac sodium (VOLTAREN) 1 % GEL 3 grams to 3 large joints up to 3 times daily Patient taking differently: Apply 3 g topically 3 (three) times daily as needed (pain).  09/06/18  Yes Deveshwar, Abel Presto, MD  escitalopram (LEXAPRO) 10 MG tablet Take 1 tablet (10 mg total) by mouth daily. 11/19/19  Yes Carmin Muskrat, MD  estradiol (VIVELLE-DOT) 0.0375 MG/24HR Place 1 patch onto the skin 2 (two) times a week. Patient taking differently: Place 1 patch onto the skin 2 (two) times a week. Wednesday and Sunday 06/06/19  Yes [name], MD  fish oil-omega-3 fatty acids 1000 MG capsule Take 2 g by mouth daily.   Yes [provider]  hydroxychloroquine (PLAQUENIL) 200 MG tablet TAKE 1 TABLET BY MOUTH TWICE DAILY [place] Patient taking differently: Take 200 mg by mouth every Monday, Tuesday, Wednesday, Thursday, and Friday.  07/29/19  Yes Deveshwar, Abel Presto, MD  LORazepam (ATIVAN) 0.5 MG tablet Take 1 tablet (0.5 mg total) by mouth every 8 (eight) hours as needed for anxiety. 11/19/19  Yes Carmin Muskrat, MD  Multiple Vitamin (MULTIVITAMIN) tablet Take 1 tablet by mouth daily.    Yes [provider]  NEXIUM 40 MG capsule Take 40 mg by mouth daily.  03/13/15  Yes [provider]  Probiotic Product (PROBIOTIC ADVANCED PO) Take 1 tablet by mouth daily.    Yes [provider]  TURMERIC PO Take 1 tablet by mouth daily.    Yes [provider]  lubiprostone (AMITIZA)  24 MCG capsule TAKE 1 CAPSULE (24 MCG TOTAL) BY MOUTH 2 (TWO) TIMES DAILY WITH A MEAL. 11/21/19   Tysinger, [name], PA-C  metoprolol tartrate (LOPRESSOR) 25 MG tablet Take 1 tablet (25 mg total) by mouth 2 (two) times daily. 11/21/19 11/20/20  Tysinger, [name], PA-C  sertraline (ZOLOFT) 50 MG tablet Take 1 tablet (50 mg total) by mouth daily. 11/15/19 11/19/19  Tysinger, [name], PA-C    Allergies    Quinolones, Latex, Levaquin [levofloxacin], Penicillins, Percocet [oxycodone-acetaminophen], Sulfa drugs cross reactors, Vicodin [hydrocodone-acetaminophen], and Gluten meal  Review of Systems   Review of Systems  Respiratory: Positive for chest tightness.   Neurological: Positive for light-headedness.  All other systems reviewed and are negative.   Physical Exam Updated Vital Signs BP (!) 139/93   Pulse 67   Temp 98.4 F (36.9 C) (Oral)   Resp 17   Ht 5\' 4"  (1.626 m)   Wt 55.3 kg   LMP 10/25/2012 (Approximate)   SpO2 100%   BMI 20.94 kg/m   Physical Exam Vitals and nursing note reviewed.  Constitutional:      General: She is not in acute distress.    Appearance: She is well-developed.     Comments: NAD.  HENT:     Head: Normocephalic and atraumatic.     Right Ear: External ear normal.     Left Ear: External ear normal.     Nose: Nose normal.  Eyes:     Conjunctiva/sclera: Conjunctivae normal.  Cardiovascular:     Rate and Rhythm: Normal rate and regular rhythm.     Heart sounds: Normal heart sounds. No murmur.     Comments: No murmurs. No LE edema. No calf tenderness.  Pulmonary:     Effort: Pulmonary effort is normal.     Breath sounds: Normal breath sounds.  Abdominal:     Palpations: Abdomen is soft.     Tenderness: There is no abdominal tenderness.     Comments: Normal BS. No tenderness. No distention. Active BS to lower quadrants.   Musculoskeletal:        General: Normal range of motion.     Cervical back: Normal range of motion and neck supple.  Skin:     General: Skin is warm and dry.     Capillary Refill: Capillary refill takes less than 2 seconds.  Neurological:     Mental Status: She is alert and oriented to person, place, and time.     Comments: Sensation and strength intact in upper/lower extremities   Psychiatric:        Behavior: Behavior normal.        Thought Content: Thought content normal.        Judgment: Judgment normal.     ED Results / Procedures / Treatments   Labs (all labs ordered are listed, but only abnormal results are displayed) Labs Reviewed  BASIC METABOLIC PANEL - Abnormal; Notable for the following components:      Result Value   Glucose, Bld 119 (*)    All other components within normal limits  CBC  BRAIN NATRIURETIC PEPTIDE  D-DIMER, QUANTITATIVE (NOT AT [facility])  TROPONIN  I (HIGH SENSITIVITY)  TROPONIN I (HIGH SENSITIVITY)    EKG EKG Interpretation  Date/Time:  Friday Nov 22 2019 00:34:24 EDT Ventricular Rate:  64 PR Interval:  166 QRS Duration: 92 QT Interval:  400 QTC Calculation: 412 R Axis:   36 Text Interpretation: Normal sinus rhythm no acute ST/T changes similar to Jan 2020 Confirmed by Sherwood Gambler [phone]) on 11/22/2019 7:45:47 AM   Radiology DG Chest 2 View  Result Date: 11/22/2019 CLINICAL DATA:  Chest tightness, uncontrolled hypertension for 1 week EXAM: CHEST - 2 VIEW COMPARISON:  Radiograph 07/30/2018, CT 04/07/2010 FINDINGS: Mild vascular congestion without frank edema. No consolidation, pneumothorax, or effusion. Cardiomediastinal contours are stable from priors. No acute osseous or soft tissue abnormality. Prior cervical fusion. Additional degenerative changes throughout the imaged spine. Air-filled loops of bowel in the left upper quadrant, nonspecific though could correlate for abdominal symptoms. IMPRESSION: 1. Mild pulmonary vascular congestion without frank edema. 2. Air-filled loops of bowel in the left upper quadrant, nonspecific though could correlate for abdominal  symptoms. Electronically Signed   By: Lovena Le M.D.   On: 11/22/2019 01:15   DG Abdomen 1 View  Result Date: 11/22/2019 CLINICAL DATA:  Bowel dilatation. EXAM: ABDOMEN - 1 VIEW COMPARISON:  December 30, 2013. FINDINGS: No abnormal bowel dilatation is noted. Status post cholecystectomy. Large amount of stool seen throughout the colon. No radio-opaque calculi or other significant radiographic abnormality are seen. IMPRESSION: Large stool burden. No evidence of bowel obstruction or ileus. Electronically Signed   By: Marijo Conception M.D.   On: 11/22/2019 09:16    Procedures Procedures (including critical care time)  Medications Ordered in ED Medications  LORazepam (ATIVAN) tablet 1 mg (1 mg Oral Given 11/22/19 0739)    ED Course  I have reviewed the triage vital signs and the nursing notes.  Pertinent labs & imaging results that were available during my care of the patient were reviewed by me and considered in my medical decision making (see chart for details).  Clinical Course as of Nov 22 955  Fri Nov 22, 2019  0711 IMPRESSION: 1. Mild pulmonary vascular congestion without frank edema. 2. Air-filled loops of bowel in the left upper quadrant, nonspecific though could correlate for abdominal symptoms.  DG Chest 2 View [CG]  [phone] 4 > 5   Troponin I (High Sensitivity): 5 [CG]  0712 BP(!): 191/83 [CG]  0712 BP(!): 157/92 [CG]  [id number] Pt reports warm feeling all over asking if this is anxiety. Reports chest still tight. HR 75, Spo2 100%, RR 16.  Will give ativan.    [CG]  [id number] D-Dimer, Quant: 0.31 [CG]    Clinical Course User Index [CG] Kinnie Feil, PA-C   MDM Rules/Calculators/A&P                      59 year old female presents for constant central chest and neck tightness since last night, palpitations at rest and on exertion.  Also reports elevated blood pressure and heart rate for the last week.  Has been seen by PCP several times for this in the last week and once in the ER  3 days ago.  PMH of HTN, HLD, positive family history for CAD, estradiol patch, GERD, constipation.  PCP has recommended metoprolol but patient hesitant to take it due to her baseline low heart rate in 50s-60s.  She is awaiting cardiology evaluation for symptoms.  EMR and nursing notes reviewed to assist with history and MDM.  I  have reviewed [facility] ER work-up including TSH, T4 are normal.  No EKG done at that time.    ER work-up initiated in triage personally reviewed and interpreted- EKG without arrhythmias, ischemia, right ventricular strain.  Screening labs unremarkable.  Troponin x2 undetectable.  Chest x-ray shows mild vascular congestion and air-filled dilated loops of bowel.    Given chest x-ray finding, recently elevated BP, we will add a BNP.  She has no history of HF. No signs of hypervolemia on exam.  Doubt acute CHF failure.  Her symptoms are not very typical of a PE but since she is on estradiol patch, will add a D-dimer.  Has had some improvement in her symptoms with Ativan at home, will give 1 mg here.  She has long history of GERD and constipation but denies any acute changes to these however chest x-ray shows air-filled loops of bowel, will obtain abdominal x-ray.  [id number]: BNP normal. D-dimer normal. KUB shows large stool burden with corroborates patients history of constipation. Encouraged daily miralax, GI f/u.  Patient was hypertensive in the ER as high as 190s/105, this improved with no intervention 130s/90s.  Patient had not taken losartan prior to arrival.     Repeat evaluation, exam unchanged.    Patient very nervous about why she is having symptoms and taking metoprolol.  Tried to reassure/refocus on benign work up.  Encouraged her to take metoprolol, call PCP and cardiologist for close f/u.  Explained red flags that would warrant return to ER.  She has no symptoms of hypertensive emergency like severe headache, stroke symptoms, neuro or pulse deficits, hypervolemia. No tachycardia,  tachypnea or hypoxia. Her chest tightness has been constant since midnight, non exertional, non pleuritic. She developed a warm sensation and worsening chest tightness twice in the ER.  Once while I was in the room but her heart rate, respiratory rate and pulse ox were normal.  Second time vitals were also normal. She ambulated with cardiac monitor and pulse ox, normal,  But she reported feeling palpitations during walking.  Some anxiety could be contributing but should have cardiology f/u, may benefit from monitor, outpatieth echo,  BP med adjustment.   Final Clinical Impression(s) / ED Diagnoses Final diagnoses:  Palpitations  Chest tightness  Constipation, unspecified constipation type    Rx / DC Orders ED Discharge Orders    None       Kinnie Feil, PA-C 11/22/19 0957    Sherwood Gambler, MD 11/22/19 1003

## 2019-11-22 NOTE — Discharge Instructions (Signed)
You were seen in the ER for chest tightness, palpitations  Work-up today was reassuring  Abdominal x-ray showed large stool burden, take a capful of MiraLAX every day and follow-up with GI for further discussion of this if your symptoms not improve  Please call your primary care doctor to clarify what blood pressure medicines he would like you to take  Please call cardiology office to confirm an appointment in the next week or so  Return to the ER for worsening or changes to your chest discomfort, severe abdominal or back pain, severe headache, vision changes, loss of sensation or weakness/heaviness to her extremities, swelling in your legs, shortness of breath or passing out episodes

## 2019-11-22 NOTE — ED Notes (Signed)
RN ambulated pt to bathroom. Pt steady on feet, SpO2 remains 100%, HR 85 while ambulating.

## 2019-11-22 NOTE — ED Notes (Signed)
Pt verbalized understanding of discharge instructions. RN reviewed follow up care and pain management at home, pt had no further questions. Brought to lobby via wheelchair to leave w/ daughter.

## 2019-11-22 NOTE — ED Notes (Signed)
Pt is complaining of more dizziness and increased heart rate, new vitals obtained.

## 2019-11-22 NOTE — ED Notes (Signed)
Pt transported to Xray. 

## 2019-11-22 NOTE — ED Triage Notes (Signed)
Pt c/o uncontrolled hypertension for a week. Has followed up with her dr whom advised her to see a cardiologist, but pt states that she just "doesn't feel right". C/o chest tightness and neck pain.

## 2019-11-23 ENCOUNTER — Emergency Department (HOSPITAL_COMMUNITY)
Admission: EM | Admit: 2019-11-23 | Discharge: 2019-11-24 | Discharge status: Against Medical Advice | Payer: BC Managed Care – PPO | Attending: Emergency Medicine | Admitting: Emergency Medicine

## 2019-11-23 ENCOUNTER — Emergency Department (HOSPITAL_COMMUNITY): Payer: BC Managed Care – PPO

## 2019-11-23 DIAGNOSIS — Z5321 Procedure and treatment not carried out due to patient leaving prior to being seen by health care provider: Secondary | ICD-10-CM | POA: Insufficient documentation

## 2019-11-23 DIAGNOSIS — R001 Bradycardia, unspecified: Secondary | ICD-10-CM | POA: Insufficient documentation

## 2019-11-23 LAB — BASIC METABOLIC PANEL
Anion gap: 18 — ABNORMAL HIGH (ref 5–15)
BUN: 16 mg/dL (ref 6–20)
CO2: 20 mmol/L — ABNORMAL LOW (ref 22–32)
Calcium: 10.1 mg/dL (ref 8.9–10.3)
Chloride: 97 mmol/L — ABNORMAL LOW (ref 98–111)
Creatinine, Ser: 0.97 mg/dL (ref 0.44–1.00)
GFR calc Af Amer: >60 mL/min (ref >60)
GFR calc non Af Amer: >60 mL/min (ref >60)
Glucose, Bld: 114 mg/dL — ABNORMAL HIGH (ref 70–99)
Potassium: 4.4 mmol/L (ref 3.5–5.1)
Sodium: 135 mmol/L (ref 135–145)

## 2019-11-23 LAB — CBC
HCT: 37.5 % (ref 36.0–46.0)
Hemoglobin: 12.6 g/dL (ref 12.0–15.0)
MCH: 30.4 pg (ref 26.0–34.0)
MCHC: 33.6 g/dL (ref 30.0–36.0)
MCV: 90.6 fL (ref 80.0–100.0)
Platelets: 177 10*3/uL (ref 150–400)
RBC: 4.14 MIL/uL (ref 3.87–5.11)
RDW: 12.8 % (ref 11.5–15.5)
WBC: 7.3 10*3/uL (ref 4.0–10.5)
nRBC: 0 % (ref 0.0–0.2)

## 2019-11-23 LAB — TROPONIN I (HIGH SENSITIVITY): Troponin I (High Sensitivity): 3 ng/L (ref <18)

## 2019-11-23 MED ORDER — SODIUM CHLORIDE 0.9% FLUSH: 3.0000 mL | Freq: Once | INTRAVENOUS | Status: DC

## 2019-11-23 NOTE — Progress Notes (Signed)
Cardiology Office Note:   Date:  11/27/2019  NAME:  Shari Lawrence    MRN: [id number] DOB:  [birth year]   PCP:  Carlena Hurl, PA-C  Cardiologist:  No primary care provider on file.   Referring MD: Carlena Hurl, PA-C   Chief Complaint  Patient presents with  . Palpitations   History of Present Illness:   Shari Lawrence is a 59 y.o. female with a hx of HTN, HLD, GERD who is being seen today for the evaluation of chest pain/palpitaiotns at the request of Carlena Hurl, PA-C. Several recent ER visits for chest pain and palpitations. EKG normal. Troponin negative.  She reports for the last 10 days she has had episodes of chest pain and palpitations.  She reports that 3-4 times per day she gets a burning sensation in her chest.  It happens at rest and is not exertional.  It is not worsened by exertion and not alleviated by rest.  She also gets 4-5 episodes of rapid heartbeat sensation.  She reports that she just feels her heart racing and going up and down.  She has been to the emergency room twice in the last 10 days and has ruled out for an acute coronary syndrome.  Her EKG is normal.  She is actually going through a lot in her life right now.  Her father did pass in March.  She reports that she is going through bereavement.  She reports she is extremely stressed and anxious.  She was given Ativan in the emergency room and this helped her symptoms.  She reports that she is not cooking and she is not eating.  She also is losing weight.  I suspect she has depression as well.  She was briefly placed on metoprolol by the emergency room for stress but is not been able to tolerate this.  I informed her she should stop this.  She does not consume excess caffeine or energy drinks.  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  She does have a history of arthritis and works with PT.  Apparently there is a positive rheumatoid test and she has  been treated with HCQ.  She is not exercising routinely but was in the past.  Apparently with the loss of her father she is just been very stressed out.  She is a never smoker and does not consume alcohol or drugs.  She reports that her father did have a history of heart disease.  Her medical history is significant for hypertension.  Problem List 1. GERD 2. HTN 3. HLD  Past Medical History: Past Medical History:  Diagnosis Date  . Abnormal Pap smear 1988   treated with cryo  . Anemia 11/2010   hematology consult prior; etiology malabsorption and uterine bleeding  . Anxiety   . Gastric polyp   . GERD (gastroesophageal reflux disease)   . H/O bone density study 12/2007  . H/O hysterectomy for benign disease 5/14  . History of echocardiogram 03/2010   normal LV function, EF 60-65%, mild left atrial enlargement  . HTN (hypertension) 03/2010   hospitalization for HTN urgency  . Hyperlipemia   . Internal hemorrhoids   . Lumbar degenerative disc disease   . Migraine   . Myalgia   . Numbness and tingling   . Spondylosis, cervical   . Umbilical hernia   . Uterine fibroid    hx/o     Past  Surgical History: Past Surgical History:  Procedure Laterality Date  . ABDOMINAL HYSTERECTOMY    . CERVICAL FUSION  2010  . CERVICAL SPINE SURGERY    . CERVIX LESION DESTRUCTION  1988  . CESAREAN SECTION    . CHOLECYSTECTOMY  2003  . COLONOSCOPY  08/2010   Dr. Collene Mares  . COLONOSCOPY  02/2017   Dr. Collene Mares  . ESOPHAGOGASTRODUODENOSCOPY  08/2010   Dr. Collene Mares  . EXPLORATORY LAPAROTOMY    . HERNIA REPAIR    . MYOMECTOMY  1998  . PELVIC LAPAROSCOPY    . ROBOTIC ASSISTED LAPAROSCOPIC HYSTERECTOMY AND SALPINGECTOMY  10/2012   [facility]; laproscopic due to fibroids  . UMBILICAL HERNIA REPAIR  10/2013   infected laparoscopic port, mesh placement    Current Medications: Current Meds  Medication Sig  . acetaminophen (TYLENOL) 500 MG tablet Take 1 tablet (500 mg total) by mouth every 6 (six) hours as needed.    . Ascorbic Acid (VITAMIN C) 1000 MG tablet Take 1,000 mg by mouth daily.   Marland Kitchen aspirin EC 81 MG tablet Take 81 mg by mouth daily.  Marland Kitchen atorvastatin (LIPITOR) 20 MG tablet TAKE 1 TABLET BY MOUTH EVERY DAY (Patient taking differently: Take 20 mg by mouth daily. )  . B Complex Vitamins (B COMPLEX PO) Take 1 tablet by mouth daily.   . calcium carbonate (OS-CAL) 600 MG TABS Take 600 mg by mouth daily.   Marland Kitchen CINNAMON PO Take 1 tablet by mouth daily.   . diclofenac sodium (VOLTAREN) 1 % GEL 3 grams to 3 large joints up to 3 times daily (Patient taking differently: Apply 3 g topically 3 (three) times daily as needed (pain). )  . escitalopram (LEXAPRO) 10 MG tablet Take 1 tablet (10 mg total) by mouth daily.  Marland Kitchen estradiol (VIVELLE-DOT) 0.0375 MG/24HR Place 1 patch onto the skin 2 (two) times a week. (Patient taking differently: Place 1 patch onto the skin 2 (two) times a week. Wednesday and Sunday)  . fish oil-omega-3 fatty acids 1000 MG capsule Take 2 g by mouth daily.  . hydroxychloroquine (PLAQUENIL) 200 MG tablet TAKE 1 TABLET BY MOUTH TWICE DAILY MONDAY THROUGH FRIDAY (Patient taking differently: Take 200 mg by mouth every Monday, Tuesday, Wednesday, Thursday, and Friday. )  . LORazepam (ATIVAN) 0.5 MG tablet Take 1 tablet (0.5 mg total) by mouth every 8 (eight) hours as needed for anxiety.  Marland Kitchen lubiprostone (AMITIZA) 24 MCG capsule TAKE 1 CAPSULE (24 MCG TOTAL) BY MOUTH 2 (TWO) TIMES DAILY WITH A MEAL.  . metoprolol tartrate (LOPRESSOR) 25 MG tablet Take 1 tablet (25 mg total) by mouth 2 (two) times daily.  . Multiple Vitamin (MULTIVITAMIN) tablet Take 1 tablet by mouth daily.   Marland Kitchen NEXIUM 40 MG capsule Take 40 mg by mouth daily.   . Probiotic Product (PROBIOTIC ADVANCED PO) Take 1 tablet by mouth daily.   . TURMERIC PO Take 1 tablet by mouth daily.      Allergies:    Quinolones, Latex, Levaquin [levofloxacin], Penicillins, Percocet [oxycodone-acetaminophen], Sulfa drugs cross reactors, Vicodin  [hydrocodone-acetaminophen], and Gluten meal   Social History: Social History   Socioeconomic History  . Marital status: Divorced    Spouse name: Not on file  . Number of children: 1  . Years of education: college  . Highest education level: Doctorate  Occupational History    Employer: [employer]    Comment: molecular Production designer, theatre/television/film, PhD candidate, [employer]  Tobacco Use  . Smoking status: Never Smoker  .  Smokeless tobacco: Never Used  Substance and Sexual Activity  . Alcohol use: No  . Drug use: No  . Sexual activity: Not Currently    Partners: Male    Birth control/protection: Surgical    Comment: hysterectomy  Other Topics Concern  . 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  Has 54 yo daughter.  Her siblings and her don't agree on helping take care of their parents, causes family tension.  As of 11/2014      Right-handed.   0.5 cup caffeine per day.   Her father lives with her.      Update 09/2019--father passed away from urosepsis in 10/01/2019   Social Determinants of Health   Financial Resource Strain:   . Difficulty of Paying Living Expenses:   Food Insecurity:   . Worried About Charity fundraiser in the Last Year:   . Arboriculturist in the Last Year:   Transportation Needs:   . Film/video editor (Medical):   Marland Kitchen Lack of Transportation (Non-Medical):   Physical Activity:   . Days of Exercise per Week:   . Minutes of Exercise per Session:   Stress:   . Feeling of Stress :   Social Connections:   . Frequency of Communication with Friends and Family:   . Frequency of Social Gatherings with Friends and Family:   . Attends Religious Services:   . Active Member of Clubs or Organizations:   . Attends Theatre manager Meetings:   Marland Kitchen Marital Status:      Family History: The patient's family history includes Cancer in her father; Colon cancer (age of onset: 57) in her mother; Deep vein thrombosis in her father; Dementia in her mother; Diabetes in her maternal aunt and paternal aunt; Heart disease (age of onset: 58) in her father; Hyperlipidemia in her brother, maternal aunt, and sister; Hypertension in her brother, brother, father, maternal aunt, mother, and sister; Parkinsonism in her father; Rheum arthritis in her brother and maternal aunt. There is no history of Breast cancer.  ROS:   All other ROS reviewed and negative. Pertinent positives noted in the HPI.     EKGs/Labs/Other Studies Reviewed:   The following studies were personally reviewed by me today:  EKG:  EKG is ordered today.  The ekg ordered today demonstrates normal sinus rhythm, heart rate 68, no acute ST-T changes, no evidence for infarction, and was personally reviewed by me.   Recent Labs: 11/19/2019: ALT 25; TSH 1.956 11/22/2019: B Natriuretic Peptide 17.4 11/26/2019: BUN 13; Creatinine, Ser 1.05; Hemoglobin 12.5; Platelets 187; Potassium 4.0; Sodium 138   Recent Lipid Panel    Component Value Date/Time   CHOL 169 05/21/2018 1633   TRIG 52 05/21/2018 1633   HDL 77 05/21/2018 1633   CHOLHDL 2.2 05/21/2018 1633   CHOLHDL 2.6 05/06/2016 1428   VLDL 39 (H) 05/06/2016 1428   LDLCALC 82 05/21/2018 1633    Physical Exam:   VS:  BP (!) 142/98   Pulse 68   Temp (!) 97 F (36.1 C)   Ht 5\' 4"  (1.626 m)   Wt 116 lb 12.8 oz (53 kg)   LMP 10/25/2012 (Approximate)   SpO2 99%   BMI 20.05 kg/m    Wt Readings from  Last 3 Encounters:  11/27/19 116 lb 12.8 oz (53 kg)  11/26/19 132 lb 4.4 oz (60 kg)  11/22/19 122 lb (55.3 kg)    General: Well nourished, well developed, in no acute distress Heart: Atraumatic, normal size  Eyes: PEERLA, EOMI  Neck: Supple, no JVD Endocrine: No thryomegaly Cardiac: Normal S1, S2; RRR; no  murmurs, rubs, or gallops Lungs: Clear to auscultation bilaterally, no wheezing, rhonchi or rales  Abd: Soft, nontender, no hepatomegaly  Ext: No edema, pulses 2+ Musculoskeletal: No deformities, BUE and BLE strength normal and equal Skin: Warm and dry, no rashes   Neuro: Alert and oriented to person, place, time, and situation, CNII-XII grossly intact, no focal deficits  Psych: Normal mood and affect   ASSESSMENT:   Shari Lawrence is a 59 y.o. female who presents for the following: 1. Palpitations   2. Chest pain of uncertain etiology   3. Essential hypertension     PLAN:   1. Palpitations 2. Chest pain of uncertain etiology -Symptoms of palpitations and chest pain.  Her chest pain is atypical.  Her EKG is normal without any acute ST-T changes.  She is been to the emergency room twice and ruled out for an acute coronary syndrome.  I have no suspicion for coronary artery disease.  Her palpitations also could represent arrhythmia versus panic attacks.  Overall, I suspect she is having panic attacks.  She is going through a lot with her passing of her father in March.  She still is in bereavement.  I think she needs further titration of antidepressive medications or antianxiety medications.  I will send this note to her primary care physician so they can evaluate this.  Her thyroid studies were normal.  We will go ahead and proceed with a 3-day Zio patch to exclude any significant arrhythmia.  However, I have a strong suspicion these are panic attacks.  3. Essential hypertension -Blood pressure 142/98.  She is on losartan.  A calcium channel blocker is not a bad idea.  We will let her primary care physician of that.   Disposition: Return if symptoms worsen or fail to improve.  Medication Adjustments/Labs and Tests Ordered: Current medicines are reviewed at length with the patient today.  Concerns regarding medicines are outlined above.  Orders Placed This Encounter  Procedures  .  LONG TERM MONITOR (3-14 DAYS)  . EKG 12-Lead   No orders of the defined types were placed in this encounter.   Patient Instructions  Medication Instructions:  The current medical regimen is effective;  continue present plan and medications.  *If you need a refill on your cardiac medications before your next appointment, please call your pharmacy*   Testing/Procedures: Your physician has recommended that you wear a 3 DAY ZIO-PATCH monitor. The Zio patch cardiac monitor continuously records heart rhythm data for up to 14 days, this is for patients being evaluated for multiple types heart rhythms. For the first 24 hours post application, please avoid getting the Zio monitor wet in the shower or by excessive sweating during exercise. After that, feel free to carry on with regular activities. Keep soaps and lotions away from the ZIO XT Patch.  This will be mailed to you, please expect 7-10 days to receive.         Follow-Up: At [facility], you and your health needs are our priority.  As part of our continuing mission to provide you with exceptional heart care, we have created designated Provider Care Teams.  These Care Teams include your primary Cardiologist (physician) and Advanced Practice Providers (APPs -  Physician Assistants and Nurse Practitioners) who all work together to provide you with the care you need, when you need it.  We recommend signing up for the patient portal called "MyChart".  Sign up information is provided on this After Visit Summary.  MyChart is used to connect with patients for Virtual Visits (Telemedicine).  Patients are able to view lab/test results, encounter notes, upcoming appointments, etc.  Non-urgent messages can be sent to your provider as well.   To learn more about what you can do with MyChart, go to [email or web address].    Your next appointment:   As needed  The format for your next appointment:   In Person  Provider:   Eleonore Chiquito,  MD       Signed, Addison Naegeli. Audie Box, [place]  [address], [place], [place] [address] [phone]  11/27/2019 9:32 AM

## 2019-11-23 NOTE — ED Triage Notes (Signed)
The pt had high bp 2 days ago  She was given a rx for  Her bp lopressor this am almost 1100am  Soon afterward 1/1/2 hours shewas checking her pulse and it was in the 40s  She has  Watched it all day and it has continued to be slow and shes afraid to not keep checking it she is very anxious

## 2019-11-24 LAB — TROPONIN I (HIGH SENSITIVITY): Troponin I (High Sensitivity): 3 ng/L (ref <18)

## 2019-11-24 NOTE — ED Notes (Signed)
Pt stated that she has a ride coming and that she just wants to go to urgent care in the AM.

## 2019-11-26 ENCOUNTER — Emergency Department (HOSPITAL_COMMUNITY): Payer: BC Managed Care – PPO

## 2019-11-26 ENCOUNTER — Encounter (HOSPITAL_COMMUNITY): Payer: Self-pay | Admitting: Emergency Medicine

## 2019-11-26 ENCOUNTER — Other Ambulatory Visit: Payer: Self-pay

## 2019-11-26 ENCOUNTER — Emergency Department (HOSPITAL_COMMUNITY)
Admission: EM | Admit: 2019-11-26 | Discharge: 2019-11-26 | Disposition: A | Discharge status: Home / Self Care | Payer: BC Managed Care – PPO | Attending: Emergency Medicine | Admitting: Emergency Medicine

## 2019-11-26 DIAGNOSIS — R079 Chest pain, unspecified: Secondary | ICD-10-CM

## 2019-11-26 DIAGNOSIS — I10 Essential (primary) hypertension: Secondary | ICD-10-CM | POA: Diagnosis not present

## 2019-11-26 DIAGNOSIS — R0789 Other chest pain: Secondary | ICD-10-CM | POA: Diagnosis present

## 2019-11-26 LAB — BASIC METABOLIC PANEL
Anion gap: 14 (ref 5–15)
BUN: 13 mg/dL (ref 6–20)
CO2: 21 mmol/L — ABNORMAL LOW (ref 22–32)
Calcium: 10 mg/dL (ref 8.9–10.3)
Chloride: 103 mmol/L (ref 98–111)
Creatinine, Ser: 1.05 mg/dL — ABNORMAL HIGH (ref 0.44–1.00)
GFR calc Af Amer: >60 mL/min (ref >60)
GFR calc non Af Amer: 58 mL/min — ABNORMAL LOW (ref >60)
Glucose, Bld: 117 mg/dL — ABNORMAL HIGH (ref 70–99)
Potassium: 4 mmol/L (ref 3.5–5.1)
Sodium: 138 mmol/L (ref 135–145)

## 2019-11-26 LAB — CBC
HCT: 38.2 % (ref 36.0–46.0)
Hemoglobin: 12.5 g/dL (ref 12.0–15.0)
MCH: 30.1 pg (ref 26.0–34.0)
MCHC: 32.7 g/dL (ref 30.0–36.0)
MCV: 92 fL (ref 80.0–100.0)
Platelets: 187 10*3/uL (ref 150–400)
RBC: 4.15 MIL/uL (ref 3.87–5.11)
RDW: 12.7 % (ref 11.5–15.5)
WBC: 5.4 10*3/uL (ref 4.0–10.5)
nRBC: 0 % (ref 0.0–0.2)

## 2019-11-26 LAB — PROTIME-INR
INR: 1 (ref 0.8–1.2)
Prothrombin Time: 12.7 seconds (ref 11.4–15.2)

## 2019-11-26 LAB — TROPONIN I (HIGH SENSITIVITY)
Troponin I (High Sensitivity): 5 ng/L (ref <18)
Troponin I (High Sensitivity): 6 ng/L (ref <18)

## 2019-11-26 LAB — D-DIMER, QUANTITATIVE: D-Dimer, Quant: 0.51 ug/mL-FEU — ABNORMAL HIGH (ref 0.00–0.50)

## 2019-11-26 MED ORDER — LORAZEPAM 1 MG PO TABS
1.0000 mg | ORAL_TABLET | Freq: Once | ORAL | Status: AC
  Administered 2019-11-26: 1 mg via ORAL
  Filled 2019-11-26: qty 1

## 2019-11-26 MED ORDER — SODIUM CHLORIDE 0.9% FLUSH: 3.0000 mL | Freq: Once | INTRAVENOUS | Status: DC

## 2019-11-26 MED ORDER — KETOROLAC TROMETHAMINE 15 MG/ML IJ SOLN
15.0000 mg | Freq: Once | INTRAMUSCULAR | Status: DC
  Filled 2019-11-26: qty 1

## 2019-11-26 NOTE — ED Notes (Signed)
Pt called out, this RN went into room and pt stated something was "going on with my heart, I feel like I am going to pass out" Pt seemed very anxious, HR remained 90-100 NSR, pt re assured that her heart is being monitored continuously and that no changes in her heart rhythm have occurred. Pt remains anxious, EDP at bedside.

## 2019-11-26 NOTE — ED Triage Notes (Signed)
Patient arrived with EMS from home reports central chest pain with palpitations/bradycardia , nausea and diaphoresis onset this evening , denies SOB or cough , she received ASA 324 mg by EMS , CBG= 131.

## 2019-11-26 NOTE — ED Provider Notes (Signed)
[facility] Emergency Department Provider Note MRN:  [id number]  Arrival date & time: 11/26/19     Chief Complaint   Chest Pain   History of Present Illness   Shari Lawrence is a 59 y.o. year-old female with a history of hypertension presenting to the ED with chief complaint of chest pain.  Location: Central chest Duration: 11 days Onset: Gradual Timing: Constant Description: Soreness Severity: Mild to moderate Exacerbating/Alleviating Factors: Worse with palpation Associated Symptoms: High blood pressure, palpitations Pertinent Negatives: Denies headache or vision change, no shortness of breath, no abdominal pain, no numbness or weakness   Review of Systems  A complete 10 system review of systems was obtained and all systems are negative except as noted in the HPI and PMH.   Patient's Health History    Past Medical History:  Diagnosis Date  . Abnormal Pap smear 1988   treated with cryo  . Anemia 11/2010   hematology consult prior; etiology malabsorption and uterine bleeding  . Anxiety   . Gastric polyp   . GERD (gastroesophageal reflux disease)   . H/O bone density study 12/2007  . H/O hysterectomy for benign disease 5/14  . History of echocardiogram 03/2010   normal LV function, EF 60-65%, mild left atrial enlargement  . HTN (hypertension) 03/2010   hospitalization for HTN urgency  . Hyperlipemia   . Internal hemorrhoids   . Lumbar degenerative disc disease   . Migraine   . Myalgia   . Numbness and tingling   . Spondylosis, cervical   . Umbilical hernia   . Uterine fibroid    hx/o     Past Surgical History:  Procedure Laterality Date  . ABDOMINAL HYSTERECTOMY    . CERVICAL FUSION  2010  . CERVICAL SPINE SURGERY    . CERVIX LESION DESTRUCTION  1988  . CESAREAN SECTION    . CHOLECYSTECTOMY  2003  . COLONOSCOPY  08/2010   Dr. Collene Mares  . COLONOSCOPY  02/2017   Dr. Collene Mares  . ESOPHAGOGASTRODUODENOSCOPY  08/2010   Dr. Collene Mares  .  EXPLORATORY LAPAROTOMY    . HERNIA REPAIR    . MYOMECTOMY  1998  . PELVIC LAPAROSCOPY    . ROBOTIC ASSISTED LAPAROSCOPIC HYSTERECTOMY AND SALPINGECTOMY  10/2012   [facility]; laproscopic due to fibroids  . UMBILICAL HERNIA REPAIR  10/2013   infected laparoscopic port, mesh placement    Family History  Problem Relation Age of Onset  . Hypertension Mother   . Dementia Mother   . Colon cancer Mother 54  . Deep vein thrombosis Father   . Hypertension Father   . Cancer Father        prostate CA  . Heart disease Father 48       CAD  . Parkinsonism Father   . Hypertension Sister   . Hyperlipidemia Sister   . Hypertension Brother   . Hyperlipidemia Brother   . Hypertension Maternal Aunt   . Hyperlipidemia Maternal Aunt   . Rheum arthritis Maternal Aunt   . Diabetes Maternal Aunt        1 with type II, 1 with type 1  . Hypertension Brother   . Rheum arthritis Brother   . Diabetes Paternal Aunt        type II  . Breast cancer Neg Hx     Social History   Socioeconomic History  . Marital status: Divorced    Spouse name: Not on file  . Number of children: 1  .  Years of education: college  . Highest education level: Doctorate  Occupational History    Employer: [employer]    Comment: molecular Production designer, theatre/television/film, PhD candidate, [employer]  Tobacco Use  . Smoking status: Never Smoker  . Smokeless tobacco: Never Used  Substance and Sexual Activity  . Alcohol use: No  . Drug use: No  . Sexual activity: Not Currently    Partners: Male    Birth control/protection: Surgical    Comment: hysterectomy  Other Topics Concern  . 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  Has 13 yo daughter.  Her siblings and her don't agree on helping take care of their  parents, causes family tension.  As of 11/2014      Right-handed.   0.5 cup caffeine per day.   Her father lives with her.      Update 09/2019--father passed away from urosepsis in 09-20-19   Social Determinants of Health   Financial Resource Strain:   . Difficulty of Paying Living Expenses:   Food Insecurity:   . Worried About Charity fundraiser in the Last Year:   . Arboriculturist in the Last Year:   Transportation Needs:   . Film/video editor (Medical):   Marland Kitchen Lack of Transportation (Non-Medical):   Physical Activity:   . Days of Exercise per Week:   . Minutes of Exercise per Session:   Stress:   . Feeling of Stress :   Social Connections:   . Frequency of Communication with Friends and Family:   . Frequency of Social Gatherings with Friends and Family:   . Attends Religious Services:   . Active Member of Clubs or Organizations:   . Attends Archivist Meetings:   Marland Kitchen Marital Status:   Intimate Partner Violence:   . Fear of Current or Ex-Partner:   . Emotionally Abused:   Marland Kitchen Physically Abused:   . Sexually Abused:      Physical Exam   Vitals:   11/26/19 0645 11/26/19 0700  BP: (!) 161/86 (!) 158/86  Pulse: 70 64  Resp: 20 11  Temp:    SpO2: 100% 100%    CONSTITUTIONAL: Well-appearing, NAD NEURO:  Alert and oriented x 3, no focal deficits EYES:  eyes equal and reactive ENT/NECK:  no LAD, no JVD CARDIO: Regular rate, well-perfused, normal S1 and S2 PULM:  CTAB no wheezing or rhonchi GI/GU:  normal bowel sounds, non-distended, non-tender MSK/SPINE:  No gross deformities, no edema SKIN:  no rash, atraumatic PSYCH: Anxious speech and behavior  *Additional and/or pertinent findings included in MDM below  Diagnostic and Interventional Summary    EKG Interpretation  Date/Time:  Tuesday November 26 2019 03:05:23 EDT Ventricular Rate:  76 PR Interval:  164 QRS Duration: 84 QT Interval:  388 QTC Calculation: 436 R Axis:   60 Text  Interpretation: Normal sinus rhythm Nonspecific T wave abnormality Abnormal ECG Confirmed by Gerlene Fee [phone]) on 11/26/2019 7:31:48 AM      Labs Reviewed  BASIC METABOLIC PANEL - Abnormal; Notable for the following components:      Result Value   CO2 21 (*)    Glucose, Bld 117 (*)    Creatinine, Ser 1.05 (*)    GFR calc non Af Wyvonnia Lora  58 (*)    All other components within normal limits  D-DIMER, QUANTITATIVE (NOT AT [facility]) - Abnormal; Notable for the following components:   D-Dimer, Quant 0.51 (*)    All other components within normal limits  CBC  PROTIME-INR  TROPONIN I (HIGH SENSITIVITY)  TROPONIN I (HIGH SENSITIVITY)    DG Chest 2 View  Final Result      Medications  sodium chloride flush (NS) 0.9 % injection 3 mL (3 mLs Intravenous Not Given 11/26/19 0721)  ketorolac (TORADOL) 15 MG/ML injection 15 mg (0 mg Intravenous Hold 11/26/19 0741)  LORazepam (ATIVAN) tablet 1 mg (1 mg Oral Given 11/26/19 0740)     Procedures  /  Critical Care Procedures  ED Course and Medical Decision Making  I have reviewed the triage vital signs, the nursing notes, and pertinent available records from the EMR.  Listed above are laboratory and imaging tests that I personally ordered, reviewed, and interpreted and then considered in my medical decision making (see below for details).      59 year old female with minimal cardiovascular risk factors here with chest pain.  Multiple ED visits for the same with negative work-ups.  Today has negative troponin x2, normal chest x-ray.  Reassuring vital signs.  She seems extremely anxious and she has had a recent death in the family, her father died back in 09/29/2022.  Anxiety or mental health could be contributing to the presentation.  However, patient has been endorsing presyncopal episodes with this pain, and she does take an estrogen patch, raising her risk of PE.  D-dimer was negative few days ago but she says her symptoms are worse.  Will rescreen with D-dimer.   Patient's pain is worse with palpation, could also be musculoskeletal.  Will reassess after Ativan and Toradol.  Patient is looking more relaxed after Ativan.  Her D-dimer when age-adjusted is negative.  Given that the pretest probability was very low, I feel that her medical work-up in the emergency department is complete.  She has established close cardiology follow-up for tomorrow morning.  Appropriate for discharge.  Barth Kirks. Sedonia Small, [place] [email or web address]  Final Clinical Impressions(s) / ED Diagnoses     ICD-10-CM   1. Chest pain, unspecified type  R07.9     ED Discharge Orders    None       Discharge Instructions Discussed with and Provided to Patient:     Discharge Instructions     You were evaluated in the Emergency Department and after careful evaluation, we did not find any emergent condition requiring admission or further testing in the hospital.  Your exam/testing today is overall reassuring.  No evidence of heart damage or blood clots.  Please keep your cardiology appointment for tomorrow morning.  Please return to the Emergency Department if you experience any worsening of your condition.  We encourage you to follow up with a primary care provider.  Thank you for allowing [place] to be a part of your care.       Maudie Flakes, MD 11/26/19 [phone]

## 2019-11-26 NOTE — Discharge Instructions (Addendum)
You were evaluated in the Emergency Department and after careful evaluation, we did not find any emergent condition requiring admission or further testing in the hospital.  Your exam/testing today is overall reassuring.  No evidence of heart damage or blood clots.  Please keep your cardiology appointment for tomorrow morning.  Please return to the Emergency Department if you experience any worsening of your condition.  We encourage you to follow up with a primary care provider.  Thank you for allowing [place] to be a part of your care.

## 2019-11-27 ENCOUNTER — Ambulatory Visit (INDEPENDENT_AMBULATORY_CARE_PROVIDER_SITE_OTHER): Payer: BC Managed Care – PPO | Admitting: Cardiovascular Disease

## 2019-11-27 ENCOUNTER — Telehealth: Payer: Self-pay | Admitting: Family Medicine

## 2019-11-27 ENCOUNTER — Encounter: Payer: Self-pay | Admitting: Cardiovascular Disease

## 2019-11-27 ENCOUNTER — Telehealth: Payer: Self-pay | Admitting: Radiology

## 2019-11-27 ENCOUNTER — Telehealth: Payer: Self-pay

## 2019-11-27 VITALS — BP 142/98 | HR 68 | Temp 97.0°F | Ht 64.0 in | Wt 116.8 lb

## 2019-11-27 DIAGNOSIS — R079 Chest pain, unspecified: Secondary | ICD-10-CM

## 2019-11-27 DIAGNOSIS — R002 Palpitations: Secondary | ICD-10-CM | POA: Diagnosis not present

## 2019-11-27 DIAGNOSIS — I10 Essential (primary) hypertension: Secondary | ICD-10-CM

## 2019-11-27 NOTE — Patient Instructions (Signed)
Medication Instructions:  The current medical regimen is effective;  continue present plan and medications.  *If you need a refill on your cardiac medications before your next appointment, please call your pharmacy*   Testing/Procedures: Your physician has recommended that you wear a 3 DAY ZIO-PATCH monitor. The Zio patch cardiac monitor continuously records heart rhythm data for up to 14 days, this is for patients being evaluated for multiple types heart rhythms. For the first 24 hours post application, please avoid getting the Zio monitor wet in the shower or by excessive sweating during exercise. After that, feel free to carry on with regular activities. Keep soaps and lotions away from the ZIO XT Patch.  This will be mailed to you, please expect 7-10 days to receive.         Follow-Up: At [facility], you and your health needs are our priority.  As part of our continuing mission to provide you with exceptional heart care, we have created designated Provider Care Teams.  These Care Teams include your primary Cardiologist (physician) and Advanced Practice Providers (APPs -  Physician Assistants and Nurse Practitioners) who all work together to provide you with the care you need, when you need it.  We recommend signing up for the patient portal called "MyChart".  Sign up information is provided on this After Visit Summary.  MyChart is used to connect with patients for Virtual Visits (Telemedicine).  Patients are able to view lab/test results, encounter notes, upcoming appointments, etc.  Non-urgent messages can be sent to your provider as well.   To learn more about what you can do with MyChart, go to [email or web address].    Your next appointment:   As needed  The format for your next appointment:   In Person  Provider:   Eleonore Chiquito, MD

## 2019-11-27 NOTE — Telephone Encounter (Signed)
Pt called and asked to speak with me.  She states she was very upset as to how she was treated this weekend.  She stated she felt as if she was bothering the doctor on call.  She said she was finally told you have already called 2 or 3 times, just go to the hospital.  Patient states she asked the on call doc should she top taking her medication and was told there would be no changes made over the weekend in medication. She states she went to Dr. Marisue Ivan, the cardiologist today, and was told she was too small to be put on Metoprolol and should have never been put on that med.  I asked her if Dr Marisue Ivan took her off of that medication and she said no, she took herself off of that medication. Patient states she does not know what to do if she needs help over the weekend.

## 2019-11-27 NOTE — Telephone Encounter (Signed)
Spoke to patient and she stated she saw cardiology this morning and her blood pressure medication was changed. She wanted to let her pcp know and make sure the change was ok. Patient was informed that cardiology specializes in heart care and she should take the medication as they prescribed. She again stated she wants the provider to know. Informed patient provider will review notes from cardiology. Patient asked to speak to Manager in reference to the on call doctor. Call was transferred.

## 2019-11-27 NOTE — Telephone Encounter (Signed)
Enrolled patient for a 3 day Zio monitor to be mailed to patients home.  

## 2019-11-28 ENCOUNTER — Telehealth: Payer: Self-pay | Admitting: Medical

## 2019-11-28 ENCOUNTER — Other Ambulatory Visit: Payer: Self-pay | Admitting: Medical

## 2019-11-28 MED ORDER — LORAZEPAM 0.5 MG PO TABS: 0.5000 mg | ORAL_TABLET | Freq: Three times a day (TID) | ORAL | 0 refills | Status: DC | PRN

## 2019-11-28 MED ORDER — AMLODIPINE BESYLATE 5 MG PO TABS
5.0000 mg | ORAL_TABLET | Freq: Every day | ORAL | 2 refills | Status: DC
Start: 2019-11-28 — End: 2020-03-11

## 2019-11-28 NOTE — Progress Notes (Signed)
RESOURCES in [place], [place]  If you are experiencing a mental health crisis or an emergency, please call 911 or go to the nearest emergency department.  [facility]   [phone] [facility]  [phone] [facility]   [phone]  Suicide Hotline 1-800-Suicide 971-873-1977)  National Suicide Prevention Lifeline (757)852-5187  (719) 053-1551)  Domestic Violence, Rape/Crisis - [place] 216-212-5433  The [employer] Violence Hotline 1-800-799-SAFE 780-852-1015)  To report Child or Elder Abuse, please call: [facility] Police Department  [id number] [facility] Department  [place] [phone]  Teen Crisis line [phone] or [phone]     Psychiatry and Counseling services  [facility] [place], [place], [place] [address] [phone]  Lina Sayre, therapist Dr. Lynder Parents, psychiatrist Dr. Milana Huntsman, child psychiatrist   Dr. Launa Flight [address], [place], [place] [address] [phone]   Dr. Chucky May, psychiatry [address] Carolynne Edouard [place], [place] [address] [phone]   [place], [place], [place] [address] [phone]   [facility] [place], [place], [place] [address] [phone]       Counseling Services (NON- psychiatrist offices) Dr. Cristopher Estimable [phone] [place], [place] [address]   [place] [address] [place], [place] [address] [phone]   [facility] [phone] [place], [place], [place] [address]   [facility] [phone]  [email or web address] [address] [place], [place], [place] [address]   Merrianne M. Clarene Reamer, therapist [phone] [address]  [place], [place] [address]   [facility] [phone] [address], [place], [place] [address]   Vic Ripper, therapist [phone] [address] [place], [place] [address]   [facility] [address] [place], [place], [place] [address]

## 2019-11-28 NOTE — Telephone Encounter (Signed)
I called patient today as a follow-up from her recent multiple calls, recent multiple after hours calls, recent emergency department visits, and recent calls where she discussed issues with our office manager  She notes today that her cardiologist put her back on amlodipine and he would defer refills to me.  She is no longer on losartan and metoprolol was discontinued by cardiology as well.  We discussed her prior intolerance of certain medicines for blood pressure.  She seems in agreement for now with amlodipine  We discussed the high priority of getting in with a counselor ASAP.  I will once again email a list of counselors she can call on.  We discussed that this has to be part of her care, not optional.  She will continue SSRI.  She will continue lorazepam as needed to help with panic attacks  We reiterated the fact that cardiology has consulted with her now, she has had multiple hospital visits reassuring no current acute MI or life-threatening situation  We discussed staying with the current plan, we discussed follow-up, discussed her recent calls here.    She agrees and voices understanding of plan  Plan to follow-up in 1 month or sooner  FYI- She had called and upset about recent phone calls.  We had discussed with her both between me, office manager and Dr. Redmond School supervising physician that with her after-hours calls over the weekend that she was reassured based on recent more than 1 emergency department visit is that she did not have a life-threatening condition that this was anxiety related, and she needed to follow through with the recommendations already given at this point.  She had called in numerous times over the last several days both during hours and after hours, she had been to the emergency department multiple times as well despite reassurance by the emergency department staff that she had no acute life-threatening cardiac issue.  At this point she has already seen cardiology  and those records were reviewed

## 2019-11-28 NOTE — Telephone Encounter (Signed)
done

## 2019-11-28 NOTE — Telephone Encounter (Signed)
[id number]- Please email this list of resources below and schedule her for follow-up in 3 to 4 weeks   RESOURCES in [place], [place]  If you are experiencing a mental health crisis or an emergency, please call 911 or go to the nearest emergency department.  [facility]   [phone] [facility]  [phone] [facility]   [phone]  Suicide Hotline 1-800-Suicide 626-441-6831)  National Suicide Prevention Lifeline 8027095150  639-791-3998)  Domestic Violence, Rape/Crisis - [place] 862-088-9905  The [employer] Violence Hotline 1-800-799-SAFE 4587888432)  To report Child or Elder Abuse, please call: [facility] Police Department  [id number] [facility] Department  [place] [phone]  Teen Crisis line ([phone] or [phone]     Psychiatry and Counseling services  [facility] [place], [place], [place] [address] [phone]  Lina Sayre, therapist Dr. Lynder Parents, psychiatrist Dr. Milana Huntsman, child psychiatrist   Dr. Launa Flight [address], [place], [place] [address] [phone]   Dr. Chucky May, psychiatry [address] Carolynne Edouard [place], [place] [address] [phone]   [facility] [place], [place], [place] [address] [phone]   [facility] [place], [place], [place] [address] [phone]       Counseling Services (NON- psychiatrist offices) Dr. Cristopher Estimable [phone] [place], [place] [address]   [place] [address] [place], [place] [address] [phone]   [facility] [phone] [place], [place], [place] [address]   [facility] [phone]  [email or web address] [address], [place], [place], [place] [address]   Merrianne M. Clarene Reamer, therapist [phone] [address] [place], [place] [address]   [facility] [phone] [address] [place], [place] [address]   Vic Ripper, therapist [phone] [address] [place], [place] [address]   [facility] [address] [place], [place],  [address]

## 2019-11-30 ENCOUNTER — Ambulatory Visit (INDEPENDENT_AMBULATORY_CARE_PROVIDER_SITE_OTHER): Payer: BC Managed Care – PPO

## 2019-11-30 DIAGNOSIS — R002 Palpitations: Secondary | ICD-10-CM | POA: Diagnosis not present

## 2019-12-09 NOTE — Telephone Encounter (Signed)
Reviewed

## 2019-12-13 ENCOUNTER — Other Ambulatory Visit: Payer: Self-pay | Admitting: Medical

## 2019-12-17 ENCOUNTER — Other Ambulatory Visit: Payer: Self-pay | Admitting: Medical

## 2019-12-19 ENCOUNTER — Encounter: Payer: Self-pay | Admitting: Medical

## 2019-12-19 ENCOUNTER — Ambulatory Visit: Payer: BC Managed Care – PPO | Admitting: Medical

## 2019-12-19 ENCOUNTER — Other Ambulatory Visit: Payer: Self-pay

## 2019-12-19 VITALS — BP 140/86 | HR 58 | Ht 64.0 in | Wt 120.6 lb

## 2019-12-19 DIAGNOSIS — F419 Anxiety disorder, unspecified: Secondary | ICD-10-CM | POA: Diagnosis not present

## 2019-12-19 DIAGNOSIS — Z8659 Personal history of other mental and behavioral disorders: Secondary | ICD-10-CM

## 2019-12-19 DIAGNOSIS — F410 Panic disorder [episodic paroxysmal anxiety] without agoraphobia: Secondary | ICD-10-CM

## 2019-12-19 DIAGNOSIS — I10 Essential (primary) hypertension: Secondary | ICD-10-CM

## 2019-12-19 DIAGNOSIS — G479 Sleep disorder, unspecified: Secondary | ICD-10-CM

## 2019-12-19 DIAGNOSIS — M359 Systemic involvement of connective tissue, unspecified: Secondary | ICD-10-CM

## 2019-12-19 NOTE — Progress Notes (Signed)
Subjective: Chief Complaint  Patient presents with  . Follow-up    on blood pressure medication    Here for follow-up.  See several recent visit notes from here, emergency department, cardiology in relation to anxiety, palpitations, elevated blood pressure, chest discomfort, grieving.  Taking Lexapro 10mg  daily currently.   She did end up starting this and is taking it currently.   She occasionally uses the Lorazepam.   Seeing a new counselor once weekly for now.  This is a Event organiser than the one she was using with grieving.  This counselor is associated with Dr. Toy Care, psychiatrist.   They discussed anxiety, methods to use to control anxiety.   She wonders if she needs a higher dose of the Lexapro.  At times feels a heat rush, heart rate speeding up.  Sometimes awakes feeling this way.  She doesn't like taking the medications though.  Doesn't like to rely on ativan.  She does get panic attacks driving.   Counselor did want her to continue to drive, but pull over if she is driving.   Feels really tired on the Lexapro.   Not sleeping well.   Blood pressure - she notes that she didn't tolerate the beta blocker, felt like the heart rate dropped into the 30s.  However, this was not observed in the emergency department visits.   We had tried this given the palpitations, heart rate into the 140s at home, chest discomfort and anxiety.  This was initiated prior to seeing the cardiology where we referred.   She saw the cardiologist.   She notes the cardiologist felt that her issues was panic, and didn't need to come back and see him.   Advised she have treatment for anxiety.  She notes some tightness in legs, like skin is tight.  No SOB, no swelling.  No paresthesias.  No other aggravating or relieving factors. No other complaint.  ROS as in subjective    Objective: BP 140/86   Pulse (!) 58   Ht 5\' 4"  (1.626 m)   Wt 120 lb 9.6 oz (54.7 kg)   LMP 10/25/2012 (Approximate)   SpO2 97%   BMI  20.70 kg/m   BP Readings from Last 3 Encounters:  12/19/19 140/86  11/27/19 (!) 142/98  11/26/19 (!) 187/89   Wt Readings from Last 3 Encounters:  12/19/19 120 lb 9.6 oz (54.7 kg)  11/27/19 116 lb 12.8 oz (53 kg)  11/26/19 132 lb 4.4 oz (60 kg)   Gen: wd, wn, nad Bilat legs nontender, no deformity, no swelling, legs neurovascularly intact Psych: answers questions appropriately    Of note, ED visit 11/26/2019, pulse documented was 64 and 70.  EKG rate was 76  Of note , ED visit 11/22/19, pulse documented was 67, EKG rate 64.  Of note, ED visit 11/19/19, pulse was 70  My visit with her 11/20/19, pulse was 75, BP 150/94.  This was the date we referred to cardiology    Assessment: Encounter Diagnoses  Name Primary?  . Essential hypertension Yes  . Anxiety   . Sleep disturbance   . Panic attack   . History of recent stressful life event   . Autoimmune disease ([place])      Plan: We discussed her recent visit and concerns.  Anxiety - there have been several recent visits here and at the emergency department regarding anxiety, chest pain and palpitations.  During 1 weekend recently, she had called Dr. Redmond School my supervising physician after-hours 3 times that  weekend for the same concerns despite the fact that she had just been to the emergency department.  He tried to reassure her and tried to help her understand that the anxiety is the main issue that needs to be addressed.  He again reiterated the need for counseling just like I have.  She is currently taking Lexapro 10 mg daily.  She has Ativan as needed for use.  She is currently seeing a Social worker.  She desires to go back to a counselor she has seen in the past.  The soonest she can get in with psychiatry is August with Dr. Toy Care.  I advised that I also feel she needs to see psychiatry and counseling.  I gave her some resources of other psychiatrist that she can probably establish with things in August.  She will look into  establishing with the former counselor she trusts.  I felt like we should defer any dose change on Lexapro to psychiatry.  Sleep disturbance-related to anxiety and stress.  We discussed sleep hygiene as she is watching TV in the bed as well.  We discussed some remedies to use to try to get to sleep.  We also discussed if she absolutely needs too, she can use Benadryl over-the-counter to help with sleep.  We focused more on non pharmaceutical remedies to help sleep  Hypertension Of note, going back even from January 2020 she was on amlodipine at that point.  Every few months she would have concerns about blood pressure being too low medicine being too strong.  We  went back and forth between amlodipine 2.5 mg and amlodipine 5 mg during those early months of 2020.  At 1 point she did not want to stay on amlodipine so we switched to losartan.  She had the same issue with losartan, sometimes complain that the pressure was too low, sometimes complain that she did not like the way the medicine made her feel.  So every medicine we have worked with she has not tolerated in some form or another.  This is another reason we want cardiology to do a consult with her to help guide blood pressure medication choices.  At her recent visits here she was really having a lot of problems with anxiety, panic attacks, palpitations, chest discomfort.  Her pressures were running a lot higher and she was not feeling good.  She had been seen in [place] a few times, we referred her to cardiology.  I reviewed the cardiology notes from November 27, 2019.  They felt that her main concern was anxiety as well.  They did a Zio patch to eval for significant arrhythmia.  At her last visit here we discussed the fact that she had not tolerated amlodipine or losartan or wanted to remain on those medicines.  We discussed other options.  She was anxious about other things, but about blood pressure as well.  I gave her the option at that time to  do a beta-blocker to help with palpitations and anxiety as beta-blockers can have a calming effect.  We also discussed that her blood pressure can run on the low side of normal.  We discussed the risk and benefits of medicine she agreed to low-dose trial of beta-blocker last visit.  She did not stay on this very long, but she feels that in hindsight she should not have been on the beta-blocker.  I again reiterated the reasons why we discussed the pros and cons and use that medication.  At her  last visit before seeing the cardiologist she was adamant we do something to help her anxiety despite the fact that she was already taking medications for anxiety.  This is why we made a trial of beta-blocker.  I felt like we had to discuss this today as I felt like she was putting me on the defensive for choosing that medication.  Currently she is back on amlodipine and seems to be tolerating it okay per cardiology.  However she says she does not have follow-up with cardiology.  I will reach out to them.  Leg tightness-no obvious abnormality on exam, reassured  So at this point she will follow up with psychiatry, counseling, and will continue current medications  F/u prn

## 2019-12-19 NOTE — Patient Instructions (Addendum)
The new close by psychiatry and counseling center: [facility] and Main phone number [facility]  [place] [phone]    Other resources:  Psychiatry and Counseling services  Dr. Launa Flight [address], [place], [place] [address] [phone]   Dr. Chucky May, psychiatry [address] Carolynne Edouard [place], [place] [address] [phone]    [facility] [place], [place], [place] [address] [phone]

## 2019-12-26 ENCOUNTER — Encounter: Payer: Self-pay | Admitting: Neurology

## 2019-12-26 ENCOUNTER — Ambulatory Visit: Payer: BC Managed Care – PPO | Admitting: Neurology

## 2019-12-26 VITALS — BP 130/81 | HR 64 | Ht 64.0 in | Wt 118.0 lb

## 2019-12-26 DIAGNOSIS — R202 Paresthesia of skin: Secondary | ICD-10-CM | POA: Diagnosis not present

## 2019-12-26 DIAGNOSIS — M542 Cervicalgia: Secondary | ICD-10-CM

## 2019-12-26 NOTE — Progress Notes (Signed)
PATIENT: Shari Lawrence DOB: 08-Nov-1960  REASON FOR VISIT: follow up HISTORY FROM: patient  HISTORY OF PRESENT ILLNESS: Today 12/26/19  HISTORY  Shari Lawrence a 59 year old female, seen in request byrheumatologist Dr.Deveshwar, [place] primary care physician Dr.Tysinger, [name], for evaluation of neck pain, radiating pain to right arm,initial evaluation was on April 08, 2019.  I have reviewed and summarized the referring note from the referring physician.She has past medical history of hypertension, hyperlipidemia, autoimmune disease, taking Plaquenil. She did have a history of cervical decompression surgery in 2010, prior to the surgery, she presented with right cervical radiculopathy.  Since April 2020, she began to notice recurrent right neck pain, radiating pain to right shoulder, right arm, sometimes woke her up from sleep, she is helping her elderly parents, sometimes bearing weight of her father with her right shoulder, in addition, she complains of intermittent bilateral lower extremity numbness since July 2020, denies significant gait abnormality, no bowel bladder incontinence.  She also complains of low back pain, but denies shooting pain to bilateral lower extremities.  Update May 22, 2019 SS: MRI of cervical spine April 18, 2019 showed solid fusion at C5-6, C6-7, degenerative changes at the rest of cervical levels  Mild central stenosis at C3-4, but no evidence of cord compression, bulging disc at C4-5, with central canal stenosis, minimum cord compression, moderate right and mild left foraminal stenosis, C6-C7, mild canal stenosis, minimum bilateral foraminal narrowing.  She indicates 60% improvement since last seen.  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  She is  not taking gabapentin consistently, in fact, rarely takes.  She reports continued pain to the base of her neck, on the left side, may radiate down the right arm, describes it as numbness, that can turn into pain.  It was daily, is now much better.  She is no longer having the numbness to her left leg.  She denies urinary or bowel incontinence. 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  She presents today for follow-up unaccompanied.  Update December 26, 2019 SS: EMG nerve conduction study in February 2021 showed carpal tunnel syndromes, right side is moderate, left side is mild, no evidence of right cervical radiculopathy.  She was placed on Cymbalta 30 mg daily, never started this due to report of strong family history feeling SI on Cymbalta.  Symptoms of paresthesia to forearms overall improved, now that she is out of school for the summer.  May have occasional neck pain, often related to position, when lying down.  For pain in the neck, she may take Tylenol or ibuprofen with good benefit.  She lost her father a few months back, has resulted in significant anxiety.  REVIEW OF SYSTEMS: Out of a complete 14 system review of symptoms, the patient complains only of the following symptoms, and all other reviewed systems are negative.  N/A  ALLERGIES: Allergies  Allergen Reactions  . Quinolones Anaphylaxis and Other (See Comments)    Whelps developed all over her body  . Latex Itching and Other (See Comments)    Skin yellowing  . Levaquin [Levofloxacin] Hives    Throat swelling  . Losartan     Intolerance, fluctuating BPs  . Metoprolol     Self reported hypotension  . Penicillins   . Percocet [Oxycodone-Acetaminophen]   . Sulfa Drugs  Cross Reactors Itching, Swelling and Other (See Comments)    Red face  . Vicodin [Hydrocodone-Acetaminophen] Itching and Nausea And Vomiting  . Gluten Meal     Other reaction(s): Other (See Comments) IBS    HOME MEDICATIONS: Outpatient Medications Prior to Visit    Medication Sig Dispense Refill  . acetaminophen (TYLENOL) 500 MG tablet Take 1 tablet (500 mg total) by mouth every 6 (six) hours as needed. 30 tablet 0  . amLODipine (NORVASC) 5 MG tablet Take 1 tablet (5 mg total) by mouth daily. 30 tablet 2  . Ascorbic Acid (VITAMIN C) 1000 MG tablet Take 1,000 mg by mouth daily.     Marland Kitchen aspirin EC 81 MG tablet Take 81 mg by mouth daily.    Marland Kitchen atorvastatin (LIPITOR) 20 MG tablet TAKE 1 TABLET BY MOUTH EVERY DAY (Patient taking differently: Take 20 mg by mouth daily. ) 90 tablet 3  . B Complex Vitamins (B COMPLEX PO) Take 1 tablet by mouth daily.     . calcium carbonate (OS-CAL) 600 MG TABS Take 600 mg by mouth daily.     Marland Kitchen CINNAMON PO Take 1 tablet by mouth daily.     . diclofenac sodium (VOLTAREN) 1 % GEL 3 grams to 3 large joints up to 3 times daily (Patient not taking: Reported on 12/19/2019) 3 Tube 3  . escitalopram (LEXAPRO) 10 MG tablet Take 1 tablet (10 mg total) by mouth daily. 30 tablet 0  . estradiol (VIVELLE-DOT) 0.0375 MG/24HR Place 1 patch onto the skin 2 (two) times a week. (Patient taking differently: Place 1 patch onto the skin 2 (two) times a week. Wednesday and Sunday) 24 patch 3  . fish oil-omega-3 fatty acids 1000 MG capsule Take 2 g by mouth daily.    . hydroxychloroquine (PLAQUENIL) 200 MG tablet TAKE 1 TABLET BY MOUTH TWICE DAILY MONDAY THROUGH FRIDAY (Patient taking differently: Take 200 mg by mouth every Monday, Tuesday, Wednesday, Thursday, and Friday. ) 40 tablet 0  . LORazepam (ATIVAN) 0.5 MG tablet Take 1 tablet (0.5 mg total) by mouth every 8 (eight) hours as needed for anxiety. 30 tablet 0  . lubiprostone (AMITIZA) 24 MCG capsule TAKE 1 CAPSULE (24 MCG TOTAL) BY MOUTH 2 (TWO) TIMES DAILY WITH A MEAL. 180 capsule 1  . Multiple Vitamin (MULTIVITAMIN) tablet Take 1 tablet by mouth daily.     Marland Kitchen NEXIUM 40 MG capsule Take 40 mg by mouth daily.     . Probiotic Product (PROBIOTIC ADVANCED PO) Take 1 tablet by mouth daily.     . TURMERIC PO  Take 1 tablet by mouth daily.      No facility-administered medications prior to visit.    PAST MEDICAL HISTORY: Past Medical History:  Diagnosis Date  . Abnormal Pap smear 1988   treated with cryo  . Anemia 11/2010   hematology consult prior; etiology malabsorption and uterine bleeding  . Anxiety   . Gastric polyp   . GERD (gastroesophageal reflux disease)   . H/O bone density study 12/2007  . H/O hysterectomy for benign disease 5/14  . History of echocardiogram 03/2010   normal LV function, EF 60-65%, mild left atrial enlargement  . HTN (hypertension) 03/2010   hospitalization for HTN urgency  . Hyperlipemia   . Internal hemorrhoids   . Lumbar degenerative disc disease   . Migraine   . Myalgia   . Numbness and tingling   . Spondylosis, cervical   . Umbilical hernia   . Uterine fibroid  hx/o     PAST SURGICAL HISTORY: Past Surgical History:  Procedure Laterality Date  . ABDOMINAL HYSTERECTOMY    . CERVICAL FUSION  09/22/08  . CERVICAL SPINE SURGERY    . CERVIX LESION DESTRUCTION  1988  . CESAREAN SECTION    . CHOLECYSTECTOMY  2001-09-22  . COLONOSCOPY  23-Sep-2010   Dr. Collene Mares  . COLONOSCOPY  02/2017   Dr. Collene Mares  . ESOPHAGOGASTRODUODENOSCOPY  September 23, 2010   Dr. Collene Mares  . EXPLORATORY LAPAROTOMY    . HERNIA REPAIR    . MYOMECTOMY  1998  . PELVIC LAPAROSCOPY    . ROBOTIC ASSISTED LAPAROSCOPIC HYSTERECTOMY AND SALPINGECTOMY  10/2012   [facility]; laproscopic due to fibroids  . UMBILICAL HERNIA REPAIR  10/2013   infected laparoscopic port, mesh placement    FAMILY HISTORY: Family History  Problem Relation Age of Onset  . Hypertension Mother   . Dementia Mother   . Colon cancer Mother 43  . Deep vein thrombosis Father   . Hypertension Father   . Cancer Father        prostate CA  . Heart disease Father 68       CAD  . Parkinsonism Father   . Hypertension Sister   . Hyperlipidemia Sister   . Hypertension Brother   . Hyperlipidemia Brother   . Hypertension Maternal Aunt   .  Hyperlipidemia Maternal Aunt   . Rheum arthritis Maternal Aunt   . Diabetes Maternal Aunt        1 with type II, 1 with type 1  . Hypertension Brother   . Rheum arthritis Brother   . Diabetes Paternal Aunt        type II  . Breast cancer Neg Hx     SOCIAL HISTORY: Social History   Socioeconomic History  . Marital status: Divorced    Spouse name: Not on file  . Number of children: 1  . Years of education: college  . Highest education level: Doctorate  Occupational History    Employer: [employer]    Comment: molecular Production designer, theatre/television/film, PhD candidate, [employer]  Tobacco Use  . Smoking status: Never Smoker  . Smokeless tobacco: Never Used  Vaping Use  . Vaping Use: Never used  Substance and Sexual Activity  . Alcohol use: No  . Drug use: No  . Sexual activity: Not Currently    Partners: Male    Birth control/protection: Surgical    Comment: hysterectomy  Other Topics Concern  . 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  Has 4 yo daughter.  Her siblings and her don't agree on helping take care of their parents, causes family tension.  As of 11/2014      Right-handed.   0.5 cup caffeine per day.   Her father lives with her.      Update 09/2019--father passed away from urosepsis in September 23, 2019   Social Determinants of Health   Financial Resource Strain:   . Difficulty of Paying Living Expenses:   Food Insecurity:   . Worried About Charity fundraiser in the Last Year:   . Arboriculturist in the Last Year:   Transportation Needs:   . Film/video editor (Medical):   Marland Kitchen Lack of Transportation (Non-Medical):   Physical  Activity:   . Days of Exercise per Week:   . Minutes of Exercise per Session:   Stress:   . Feeling of Stress :    Social Connections:   . Frequency of Communication with Friends and Family:   . Frequency of Social Gatherings with Friends and Family:   . Attends Religious Services:   . Active Member of Clubs or Organizations:   . Attends Archivist Meetings:   Marland Kitchen Marital Status:   Intimate Partner Violence:   . Fear of Current or Ex-Partner:   . Emotionally Abused:   Marland Kitchen Physically Abused:   . Sexually Abused:    PHYSICAL EXAM  Vitals:   12/26/19 1009  BP: 130/81  Pulse: 64  Weight: 118 lb (53.5 kg)  Height: 5\' 4"  (1.626 m)   Body mass index is 20.25 kg/m.  Generalized: Well developed, in no acute distress   Neurological examination  Mentation: Alert oriented to time, place, history taking. Follows all commands speech and language fluent Cranial nerve II-XII: Pupils were equal round reactive to light. Extraocular movements were full, visual field were full on confrontational test. Facial sensation and strength were normal. Head turning and shoulder shrug  were normal and symmetric. Motor: The motor testing reveals 5 over 5 strength of all 4 extremities. Good symmetric motor tone is noted throughout.  Sensory: Sensory testing is intact to soft touch on all 4 extremities. No evidence of extinction is noted.  Coordination: Cerebellar testing reveals good finger-nose-finger and heel-to-shin bilaterally.  Gait and station: Gait is normal. Tandem gait is normal. Romberg is negative. No drift is seen.  Reflexes: Deep tendon reflexes are symmetric and normal bilaterally.   DIAGNOSTIC DATA (LABS, IMAGING, TESTING) - I reviewed patient records, labs, notes, testing and imaging myself where available.  Lab Results  Component Value Date   WBC 5.4 11/26/2019   HGB 12.5 11/26/2019   HCT 38.2 11/26/2019   MCV 92.0 11/26/2019   PLT 187 11/26/2019      Component Value Date/Time   NA 138 11/26/2019 0318   NA 141 05/21/2018 1633   K 4.0 11/26/2019 0318   CL 103 11/26/2019 0318   CO2 21  (L) 11/26/2019 0318   GLUCOSE 117 (H) 11/26/2019 0318   BUN 13 11/26/2019 0318   BUN 12 05/21/2018 1633   CREATININE 1.05 (H) 11/26/2019 0318   CREATININE 0.88 07/31/2019 1435   CALCIUM 10.0 11/26/2019 0318   PROT 8.4 (H) 11/19/2019 1308   PROT 6.6 05/21/2018 1633   ALBUMIN 5.1 (H) 11/19/2019 1308   ALBUMIN 4.5 05/21/2018 1633   AST 37 11/19/2019 1308   ALT 25 11/19/2019 1308   ALKPHOS 69 11/19/2019 1308   BILITOT 0.7 11/19/2019 1308   BILITOT 0.4 05/21/2018 1633   GFRNONAA 58 (L) 11/26/2019 0318   GFRNONAA 75 03/19/2019 1516   GFRAA >60 11/26/2019 0318   GFRAA 87 03/19/2019 1516   Lab Results  Component Value Date   CHOL 169 05/21/2018   HDL 77 05/21/2018   LDLCALC 82 05/21/2018   TRIG 52 05/21/2018   CHOLHDL 2.2 05/21/2018   Lab Results  Component Value Date   HGBA1C 6.0 (H) 05/21/2018   Lab Results  Component Value Date   VITAMINB12 486 05/06/2016   Lab Results  Component Value Date   TSH 1.956 11/19/2019      ASSESSMENT AND PLAN 59 y.o. year old female  has a past medical history of Abnormal Pap smear (1988), Anemia (11/2010), Anxiety, Gastric  polyp, GERD (gastroesophageal reflux disease), H/O bone density study (12/2007), H/O hysterectomy for benign disease (5/14), History of echocardiogram (03/2010), HTN (hypertension) (03/2010), Hyperlipemia, Internal hemorrhoids, Lumbar degenerative disc disease, Migraine, Myalgia, Numbness and tingling, Spondylosis, cervical, Umbilical hernia, and Uterine fibroid. here with:  1.  Neck pain  -MRI of cervical spine 04/18/2019 showed solid fusion at  C5-6, C6-7, degenerative changes at the rest of cervical levels. Copied Dr. Rhea Belton note 05/08/2019: Mild central stenosis at C3-4, but no evidence of cord compression,bulging disc at C4-5, with central canal stenosis, minimum cord compression, moderate right, mild left foraminal stenosis, C6-C7, mild canal stenosis, minimum bilateral foraminal narrowing.  -Nerve conduction  evaluation in February 2021 showed carpal tunnel syndrome, right side is moderate, left-sided mild, no evidence of right cervical radiculopathy  -Could not tolerate gabapentin or Cymbalta  -Overall, symptoms have improved, completed physical therapy, encouraged exercise, stretching, NSAIDs as needed, heating pad, continue follow-up with primary doctor, follow-up in our office on an as-needed basis  I spent 20 minutes of face-to-face and non-face-to-face time with patient.  This included previsit chart review, lab review, study review, order entry, electronic health record documentation, patient education.  Butler Denmark, AGNP-C, DNP 12/26/2019, 11:07 AM [facility] [address], [place], [place] [address] [phone]

## 2020-01-09 ENCOUNTER — Telehealth: Payer: Self-pay | Admitting: Medical

## 2020-01-09 ENCOUNTER — Other Ambulatory Visit: Payer: Self-pay | Admitting: Medical

## 2020-01-09 MED ORDER — ESCITALOPRAM OXALATE 10 MG PO TABS: 10.0000 mg | ORAL_TABLET | Freq: Every day | ORAL | 2 refills | Status: DC

## 2020-01-09 NOTE — Telephone Encounter (Signed)
Pt left VM and wanted to see if you could refill her Lexapro that was prescribed by an ER physician. She stated that she only has a few pills left

## 2020-01-09 NOTE — Telephone Encounter (Signed)
done

## 2020-01-10 ENCOUNTER — Telehealth: Payer: Self-pay | Admitting: Obstetrics and Gynecology

## 2020-01-10 DIAGNOSIS — R102 Pelvic and perineal pain: Secondary | ICD-10-CM

## 2020-01-10 NOTE — Telephone Encounter (Signed)
Spoke with patient. Patient reports "nagging" right sided pelvic pain for 2 weeks, currently 2/10 on pain scale. Denies fever/chills, tenderness, N/V, urinary symptoms, vaginal bleeding, odor or d/c. Hx of hysterectomy, still has ovaries. Hx of constipation. Last BM 01/09/20, states she is seen by GI for constipation, "takes medication to help with this". Patient was last seen in office on 09/10/19 for abdominal pain and bloating. Patient reports current pain is different than when she was seen in 08/2019.   PUS scheduled for 01/23/20 at 4pm with consult to follow at 4:30pm with Dr. Talbert Nan. Patient declined earlier appt for 7/27, will be out of town. Patient states she can only schedule on Thursdays. ER precautions provided for new or worsening symptoms. Advised covering provider will review, our office will return call if any additional recommendation. Cancellation policy reviewed. Order placed for PUS.   Routing to covering provider for final review. Patient is agreeable to disposition. Will close encounter.  Cc: Dr. Talbert Nan, Magdalene Patricia, [place]

## 2020-01-10 NOTE — Telephone Encounter (Signed)
Patient is having "pelivis pain for a while" and would like to see Dr.Jertson.

## 2020-01-13 ENCOUNTER — Telehealth: Payer: Self-pay | Admitting: Obstetrics and Gynecology

## 2020-01-13 NOTE — Telephone Encounter (Signed)
Spoke with patient regarding benefits for scheduled ultrasound. Patient acknowledges understanding of information presented. Encounter closed. °

## 2020-01-23 ENCOUNTER — Other Ambulatory Visit: Payer: Self-pay

## 2020-01-23 ENCOUNTER — Ambulatory Visit (INDEPENDENT_AMBULATORY_CARE_PROVIDER_SITE_OTHER): Payer: BC Managed Care – PPO

## 2020-01-23 ENCOUNTER — Ambulatory Visit (INDEPENDENT_AMBULATORY_CARE_PROVIDER_SITE_OTHER): Payer: BC Managed Care – PPO | Admitting: Obstetrics and Gynecology

## 2020-01-23 ENCOUNTER — Encounter: Payer: Self-pay | Admitting: Obstetrics and Gynecology

## 2020-01-23 VITALS — BP 100/54 | HR 56 | Resp 14 | Ht 64.0 in | Wt 123.0 lb

## 2020-01-23 DIAGNOSIS — R102 Pelvic and perineal pain: Secondary | ICD-10-CM | POA: Diagnosis not present

## 2020-01-23 DIAGNOSIS — S76211A Strain of adductor muscle, fascia and tendon of right thigh, initial encounter: Secondary | ICD-10-CM

## 2020-01-23 DIAGNOSIS — R109 Unspecified abdominal pain: Secondary | ICD-10-CM | POA: Diagnosis not present

## 2020-01-23 DIAGNOSIS — F418 Other specified anxiety disorders: Secondary | ICD-10-CM

## 2020-01-23 NOTE — Patient Instructions (Signed)
Muscle Strain  A muscle strain is an injury that occurs when a muscle is stretched beyond its normal length. Usually, a small number of muscle fibers are torn when this happens. There are three types of muscle strains. First-degree strains have the least amount of muscle fiber tearing and the least amount of pain. Second-degree and third-degree strains have more tearing and pain.  Usually, recovery from muscle strain takes 1-2 weeks. Complete healing normally takes 5-6 weeks.  What are the causes?  This condition is caused when a sudden, violent force is placed on a muscle and stretches it too far. This may occur with a fall, lifting, or sports.  What increases the risk?  This condition is more likely to develop in athletes and people who are physically active.  What are the signs or symptoms?  Symptoms of this condition include:  · Pain.  · Bruising.  · Swelling.  · Trouble using the muscle.  How is this diagnosed?  This condition is diagnosed based on a physical exam and your medical history. Tests may also be done, including an X-ray, ultrasound, or MRI.  How is this treated?  This condition is initially treated with PRICE therapy. This therapy involves:  · Protecting the muscle from being injured again.  · Resting the injured muscle.  · Icing the injured muscle.  · Applying pressure (compression) to the injured muscle. This may be done with a splint or elastic bandage.  · Raising (elevating) the injured muscle.  Your health care provider may also recommend medicine for pain.  Follow these instructions at home:  If you have a splint:  · Wear the splint as told by your health care provider. Remove it only as told by your health care provider.  · Loosen the splint if your fingers or toes tingle, become numb, or turn cold and blue.  · Keep the splint clean.  · If the splint is not waterproof:  ? Do not let it get wet.  ? Cover it with a watertight covering when you take a bath or a shower.  Managing pain, stiffness,  and swelling    · If directed, put ice on the injured area.  ? If you have a removable splint, remove it as told by your health care provider.  ? Put ice in a plastic bag.  ? Place a towel between your skin and the bag.  ? Leave the ice on for 20 minutes, 2-3 times a day.  · Move your fingers or toes often to avoid stiffness and to lessen swelling.  · Raise (elevate) the injured area above the level of your heart while you are sitting or lying down.  · Wear an elastic bandage as told by your health care provider. Make sure that it is not too tight.  General instructions  · Take over-the-counter and prescription medicines only as told by your health care provider.  · Restrict your activity and rest the injured muscle as told by your health care provider. Gentle movements may be allowed.  · If physical therapy was prescribed, do exercises as told by your health care provider.  · Do not put pressure on any part of the splint until it is fully hardened. This may take several hours.  · Do not use any products that contain nicotine or tobacco, such as cigarettes and e-cigarettes. These can delay bone healing. If you need help quitting, ask your health care provider.  · Ask your health care provider when it   You have more pain or swelling in the injured area. Get help right away if:  You have numbness or tingling or lose a lot of strength in the injured area. Summary  A muscle strain is an injury that occurs when a muscle is stretched beyond its normal length.  This condition is caused when a sudden, violent force is placed on a muscle and stretches it too far.  This condition is initially treated with PRICE therapy, which involves protecting, resting,  icing, compressing, and elevating.  Gentle movements may be allowed. If physical therapy was prescribed, do exercises as told by your health care provider. This information is not intended to replace advice given to you by your health care provider. Make sure you discuss any questions you have with your health care provider. Document Revised: 05/26/2017 Document Reviewed: 07/20/2016 Elsevier Patient Education  2020 [employer]  

## 2020-01-23 NOTE — Progress Notes (Signed)
GYNECOLOGY  VISIT   HPI: 59 y.o.   Divorced Black or [place] American Not Hispanic or Latino  female   [id number] with Patient's last menstrual period was 10/25/2012 (approximate).   here for evaluation of right sided pelvic pain   She c/o a 6 week h/o RLQ abdominal/pelvic pain. The pain has been constant, today it is less uncomfortable, just tight. The pain is achy and cramping, up to a 5 /10 in severity. Today pain is a 2-3/10 in severity.  The pain doesn't worsen with movement. She was really struggling with constipation. Her bowels are moving better, normal BM daily in the last week. She has been seeing a GI MD.    She is struggling with panic attacks and anxiety. Her HR was up to 140 baseline. Her BP was very high at PT, they called the ambulance. She was started on lexapro which is helping.  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 She has struggled since her Dad's death. She has an appointment with Psychiatry.   GYNECOLOGIC HISTORY: Patient's last menstrual period was 10/25/2012 (approximate). Contraception: hysterectomy  Menopausal hormone therapy: none         OB History    Gravida  1   Para  1   Term  1   Preterm  0   AB  0   Living  1     SAB  0   TAB  0   Ectopic  0   Multiple  0   Live Births  1              Patient Active Problem List   Diagnosis Date Noted  . History of recent stressful life event 11/20/2019  . Panic attack 11/15/2019  . Fall 11/13/2019  . Pain in both hands 11/13/2019  . Sleep disturbance 11/13/2019  . Grief 11/13/2019  . Bilateral carpal tunnel syndrome 08/21/2019  . Chronic pain of both knees 06/14/2019  . Right arm pain 06/14/2019  . Close exposure to COVID-19 virus 06/14/2019  . Paresthesia 04/08/2019  . Neck pain 03/30/2019  . Radicular pain in right arm 03/30/2019  . Numbness of leg 03/30/2019  . Acute stress reaction 07/24/2018  . Chest pain 07/24/2018  . Hypotension 07/17/2018  . Vertigo 07/17/2018  . Nausea  07/17/2018  . Vaccine counseling 05/21/2018  . Need for influenza vaccination 05/21/2018  . Routine general medical examination at a health care facility 05/21/2018  . Caregiver burden 07/05/2017  . Cyst of skin 07/05/2017  . Elevated blood pressure reading 06/28/2017  . Leg pain, diffuse, left 06/28/2017  . Leg swelling 06/28/2017  . Bruising 06/28/2017  . Myofascial muscle pain 06/28/2017  . High risk medication use 03/30/2017  . Chronic constipation 11/17/2016  . Globus sensation 11/17/2016  . Primary osteoarthritis of both knees 10/05/2016  . Autoimmune disease ([place]) 09/29/2016  . Primary osteoarthritis of both hands 09/29/2016  . Family history of lupus erythematosus 09/20/2016  . Polyarthralgia 05/06/2016  . Clenching of teeth 05/06/2016  . Finger swelling 05/06/2016  . Morning stiffness of joints 05/06/2016  . Myalgia 05/06/2016  . Family history of rheumatoid arthritis 05/06/2016  . Essential hypertension 12/23/2014  . History of anemia 12/23/2014  . Care provider for parents 12/23/2014  . Post-operative state 11/13/2012  . Hyperlipidemia 11/11/2012  . Hypertension 11/11/2012  . Impaired fasting glucose 10/25/2011  . Gluten intolerance 10/25/2011  . Gastroesophageal reflux disease without esophagitis 10/25/2011  . Anxiety   . Iron deficiency anemia  Past Medical History:  Diagnosis Date  . Abnormal Pap smear 1988   treated with cryo  . Anemia 11/2010   hematology consult prior; etiology malabsorption and uterine bleeding  . Anxiety   . Gastric polyp   . GERD (gastroesophageal reflux disease)   . H/O bone density study 12/2007  . H/O hysterectomy for benign disease 5/14  . History of echocardiogram 03/2010   normal LV function, EF 60-65%, mild left atrial enlargement  . HTN (hypertension) 03/2010   hospitalization for HTN urgency  . Hyperlipemia   . Internal hemorrhoids   . Lumbar degenerative disc disease   . Migraine   . Myalgia   . Numbness and  tingling   . Spondylosis, cervical   . Umbilical hernia   . Uterine fibroid    hx/o     Past Surgical History:  Procedure Laterality Date  . ABDOMINAL HYSTERECTOMY    . CERVICAL FUSION  2010  . CERVICAL SPINE SURGERY    . CERVIX LESION DESTRUCTION  1988  . CESAREAN SECTION    . CHOLECYSTECTOMY  2003  . COLONOSCOPY  08/2010   Dr. Collene Mares  . COLONOSCOPY  02/2017   Dr. Collene Mares  . ESOPHAGOGASTRODUODENOSCOPY  08/2010   Dr. Collene Mares  . EXPLORATORY LAPAROTOMY    . HERNIA REPAIR    . MYOMECTOMY  1998  . PELVIC LAPAROSCOPY    . ROBOTIC ASSISTED LAPAROSCOPIC HYSTERECTOMY AND SALPINGECTOMY  10/2012   [facility]; laproscopic due to fibroids  . UMBILICAL HERNIA REPAIR  10/2013   infected laparoscopic port, mesh placement    Current Outpatient Medications  Medication Sig Dispense Refill  . acetaminophen (TYLENOL) 500 MG tablet Take 1 tablet (500 mg total) by mouth every 6 (six) hours as needed. 30 tablet 0  . amLODipine (NORVASC) 5 MG tablet Take 1 tablet (5 mg total) by mouth daily. 30 tablet 2  . Ascorbic Acid (VITAMIN C) 1000 MG tablet Take 1,000 mg by mouth daily.     Marland Kitchen aspirin EC 81 MG tablet Take 81 mg by mouth daily.    Marland Kitchen atorvastatin (LIPITOR) 20 MG tablet TAKE 1 TABLET BY MOUTH EVERY DAY (Patient taking differently: Take 20 mg by mouth daily. ) 90 tablet 3  . B Complex Vitamins (B COMPLEX PO) Take 1 tablet by mouth daily.     . calcium carbonate (OS-CAL) 600 MG TABS Take 600 mg by mouth daily.     Marland Kitchen CINNAMON PO Take 1 tablet by mouth daily.     . diclofenac sodium (VOLTAREN) 1 % GEL 3 grams to 3 large joints up to 3 times daily (Patient not taking: Reported on 12/19/2019) 3 Tube 3  . escitalopram (LEXAPRO) 10 MG tablet Take 1 tablet (10 mg total) by mouth daily. 30 tablet 2  . estradiol (VIVELLE-DOT) 0.0375 MG/24HR Place 1 patch onto the skin 2 (two) times a week. (Patient taking differently: Place 1 patch onto the skin 2 (two) times a week. Wednesday and Sunday) 24 patch 3  . fish oil-omega-3 fatty  acids 1000 MG capsule Take 2 g by mouth daily.    . hydroxychloroquine (PLAQUENIL) 200 MG tablet TAKE 1 TABLET BY MOUTH TWICE DAILY MONDAY THROUGH FRIDAY (Patient taking differently: Take 200 mg by mouth every Monday, Tuesday, Wednesday, Thursday, and Friday. ) 40 tablet 0  . LORazepam (ATIVAN) 0.5 MG tablet Take 1 tablet (0.5 mg total) by mouth every 8 (eight) hours as needed for anxiety. 30 tablet 0  . lubiprostone (AMITIZA) 24 MCG capsule  TAKE 1 CAPSULE (24 MCG TOTAL) BY MOUTH 2 (TWO) TIMES DAILY WITH A MEAL. 180 capsule 1  . Multiple Vitamin (MULTIVITAMIN) tablet Take 1 tablet by mouth daily.     Marland Kitchen NEXIUM 40 MG capsule Take 40 mg by mouth daily.     . Probiotic Product (PROBIOTIC ADVANCED PO) Take 1 tablet by mouth daily.     . TURMERIC PO Take 1 tablet by mouth daily.      No current facility-administered medications for this visit.     ALLERGIES: Quinolones, Latex, Levaquin [levofloxacin], Losartan, Metoprolol, Penicillins, Percocet [oxycodone-acetaminophen], Sulfa drugs cross reactors, Vicodin [hydrocodone-acetaminophen], and Gluten meal  Family History  Problem Relation Age of Onset  . Hypertension Mother   . Dementia Mother   . Colon cancer Mother 61  . Deep vein thrombosis Father   . Hypertension Father   . Cancer Father        prostate CA  . Heart disease Father 36       CAD  . Parkinsonism Father   . Hypertension Sister   . Hyperlipidemia Sister   . Hypertension Brother   . Hyperlipidemia Brother   . Hypertension Maternal Aunt   . Hyperlipidemia Maternal Aunt   . Rheum arthritis Maternal Aunt   . Diabetes Maternal Aunt        1 with type II, 1 with type 1  . Hypertension Brother   . Rheum arthritis Brother   . Diabetes Paternal Aunt        type II  . Breast cancer Neg Hx     Social History   Socioeconomic History  . Marital status: Divorced    Spouse name: Not on file  . Number of children: 1  . Years of education: college  . Highest education level:  Doctorate  Occupational History    Employer: [employer]    Comment: molecular Production designer, theatre/television/film, PhD candidate, [employer]  Tobacco Use  . Smoking status: Never Smoker  . Smokeless tobacco: Never Used  Vaping Use  . Vaping Use: Never used  Substance and Sexual Activity  . Alcohol use: No  . Drug use: No  . Sexual activity: Not Currently    Partners: Male    Birth control/protection: Surgical    Comment: hysterectomy  Other Topics Concern  . 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  Has 79 yo daughter.  Her siblings and her don't agree on helping take care of their parents, causes family tension.  As of 11/2014      Right-handed.   0.5 cup caffeine per day.   Her father lives with her.      Update 09/2019--father passed away from urosepsis in September 29, 2019   Social Determinants of Health   Financial Resource Strain:   . Difficulty of Paying Living Expenses:   Food Insecurity:   . Worried About Charity fundraiser in the Last Year:   . Arboriculturist in the Last Year:   Transportation Needs:   . Film/video editor (Medical):   Marland Kitchen Lack of Transportation (Non-Medical):   Physical Activity:   . Days of Exercise per Week:   . Minutes of Exercise per Session:   Stress:   . Feeling of Stress :   Social  Connections:   . Frequency of Communication with Friends and Family:   . Frequency of Social Gatherings with Friends and Family:   . Attends Religious Services:   . Active Member of Clubs or Organizations:   . Attends Archivist Meetings:   Marland Kitchen Marital Status:   Intimate Partner Violence:   . Fear of Current or Ex-Partner:   . Emotionally Abused:   Marland Kitchen Physically Abused:   . Sexually Abused:     Review of Systems   Constitutional: Negative.   HENT: Negative.   Eyes: Negative.   Respiratory: Negative.   Cardiovascular: Negative.   Gastrointestinal: Negative.   Genitourinary: Negative.        Right sided pelvic pain   Skin: Negative.   Neurological: Negative.   Endo/Heme/Allergies: Negative.   Psychiatric/Behavioral: Negative.     PHYSICAL EXAMINATION:    LMP 10/25/2012 (Approximate)     General appearance: alert, cooperative and appears stated age Abdomen: soft, non-tender; non distended, no masses,  no organomegaly Groin: no bulging, tender to palpation in the right groin.   Pelvic:deferred   Ultrasound: images reviewed with the patient. Normal ovaries, absent uterus, no signs of appendicitis.   ASSESSMENT Abdominal/groin pain. Normal ovaries on ultrasound. Suspect groin strain Anxiety/depression, doing better on lexapro H/o constipation, currently better on medication    PLAN Reassured that her ovaries are normal Discussed groin strain, ms pain, information given from [facility]TD She will f/u with GI as needed F/u with Psychiatry for anxiety and depression  ~27 minutes in total patient care. Over 20 minutes in evaluation and management.

## 2020-02-03 ENCOUNTER — Telehealth: Payer: Self-pay | Admitting: Rheumatology

## 2020-02-03 NOTE — Telephone Encounter (Signed)
Patient scheduled to have labs CBC w/diff and CMP w/GFR every 12 weeks. Due around 02/21/2020. I called patient.

## 2020-02-03 NOTE — Telephone Encounter (Signed)
Please call patient to advise when she is due for labs. Patient cannot remember.

## 2020-02-07 ENCOUNTER — Other Ambulatory Visit: Payer: Self-pay | Admitting: Medical

## 2020-02-09 NOTE — Telephone Encounter (Signed)
I received refill request on ant-depressant.  At this point I believe she has probably established with psychiatry/psychiatrist for medication management.  Just wanted to confirm as they should be refilling this medication now.

## 2020-02-25 ENCOUNTER — Telehealth: Payer: Self-pay | Admitting: Rheumatology

## 2020-02-25 DIAGNOSIS — M359 Systemic involvement of connective tissue, unspecified: Secondary | ICD-10-CM

## 2020-02-25 MED ORDER — HYDROXYCHLOROQUINE SULFATE 200 MG PO TABS: ORAL_TABLET | ORAL | 0 refills | Status: DC

## 2020-02-25 NOTE — Telephone Encounter (Signed)
Last Visit: 10/17/2019 Next Visit: 03/19/2020 Labs: 11/26/2019 Glucose 117, CO2 21, Creat. 1.05  Eye exam: 06/04/2019 WNL   Current Dose per office note 10/17/2019: PLQ 200 mg 1 tablet by mouth daily twice daily M-F only DX: Autoimmune disease   Okay to refill Plaquenil?

## 2020-02-25 NOTE — Telephone Encounter (Signed)
Patient left a voicemail requesting prescription refill of Hydroxychloroquine.    

## 2020-03-10 ENCOUNTER — Other Ambulatory Visit: Payer: Self-pay | Admitting: Medical

## 2020-03-13 NOTE — Progress Notes (Signed)
Office Visit Note  Patient: Yarielys Beed             Date of Birth: 02-Aug-1960           MRN: [id number]             PCP: Carlena Hurl, PA-C Referring: Carlena Hurl, PA-C Visit Date: 03/19/2020 Occupation: @GUAROCC @  Subjective:  Medication Management   History of Present Illness: Markasia Carrol is a 59 y.o. female with history of autoimmune disease and osteoarthritis.  She states she has been under a lot of stress and had a lot of anxiety due to stressful situation.  The symptoms are gradually improving and she has been taking medications.  She had occasional oral ulcers or nasal ulcers.  She had joint discomfort.  She denies any joint swelling.  Activities of Daily Living:  Patient reports morning stiffness for 1 hour.   Patient Reports nocturnal pain.  Difficulty dressing/grooming: Reports Difficulty climbing stairs: Reports Difficulty getting out of chair: Reports Difficulty using hands for taps, buttons, cutlery, and/or writing: Reports  Review of Systems  Constitutional: Positive for fatigue.  HENT: Positive for mouth dryness and nose dryness. Negative for mouth sores.   Eyes: Positive for dryness. Negative for pain and visual disturbance.  Respiratory: Negative for cough, shortness of breath and difficulty breathing.   Cardiovascular: Positive for palpitations. Negative for chest pain and swelling in legs/feet.       Related to anxiety.  Gastrointestinal: Positive for abdominal pain, constipation and diarrhea. Negative for blood in stool.  Endocrine: Negative for increased urination.  Genitourinary: Negative for painful urination.  Musculoskeletal: Positive for arthralgias, joint pain, myalgias, morning stiffness, muscle tenderness and myalgias. Negative for joint swelling and muscle weakness.  Skin: Negative for color change, rash and redness.  Allergic/Immunologic: Negative for susceptible to infections.  Neurological: Positive for numbness,  headaches and weakness. Negative for dizziness and memory loss.  Hematological: Negative for bruising/bleeding tendency.  Psychiatric/Behavioral: Positive for depressed mood and sleep disturbance. Negative for confusion. The patient is nervous/anxious.     PMFS History:  Patient Active Problem List   Diagnosis Date Noted  . History of recent stressful life event 11/20/2019  . Panic attack 11/15/2019  . Fall 11/13/2019  . Pain in both hands 11/13/2019  . Sleep disturbance 11/13/2019  . Grief 11/13/2019  . Bilateral carpal tunnel syndrome 08/21/2019  . Chronic pain of both knees 06/14/2019  . Right arm pain 06/14/2019  . Close exposure to COVID-19 virus 06/14/2019  . Paresthesia 04/08/2019  . Neck pain 03/30/2019  . Radicular pain in right arm 03/30/2019  . Numbness of leg 03/30/2019  . Acute stress reaction 07/24/2018  . Chest pain 07/24/2018  . Hypotension 07/17/2018  . Vertigo 07/17/2018  . Nausea 07/17/2018  . Vaccine counseling 05/21/2018  . Need for influenza vaccination 05/21/2018  . Routine general medical examination at a health care facility 05/21/2018  . Caregiver burden 07/05/2017  . Cyst of skin 07/05/2017  . Elevated blood pressure reading 06/28/2017  . Leg pain, diffuse, left 06/28/2017  . Leg swelling 06/28/2017  . Bruising 06/28/2017  . Myofascial muscle pain 06/28/2017  . High risk medication use 03/30/2017  . Chronic constipation 11/17/2016  . Globus sensation 11/17/2016  . Primary osteoarthritis of both knees 10/05/2016  . Autoimmune disease ([place]) 09/29/2016  . Primary osteoarthritis of both hands 09/29/2016  . Family history of lupus erythematosus 09/20/2016  . Polyarthralgia 05/06/2016  . Clenching of teeth 05/06/2016  .  Finger swelling 05/06/2016  . Morning stiffness of joints 05/06/2016  . Myalgia 05/06/2016  . Family history of rheumatoid arthritis 05/06/2016  . Essential hypertension 12/23/2014  . History of anemia 12/23/2014  . Care  provider for parents 12/23/2014  . Post-operative state 11/13/2012  . Hyperlipidemia 11/11/2012  . Hypertension 11/11/2012  . Impaired fasting glucose 10/25/2011  . Gluten intolerance 10/25/2011  . Gastroesophageal reflux disease without esophagitis 10/25/2011  . Anxiety   . Iron deficiency anemia     Past Medical History:  Diagnosis Date  . Abnormal Pap smear 1988   treated with cryo  . Anemia 11/2010   hematology consult prior; etiology malabsorption and uterine bleeding  . Anxiety   . Gastric polyp   . GERD (gastroesophageal reflux disease)   . H/O bone density study 12/2007  . H/O hysterectomy for benign disease 5/14  . History of echocardiogram 03/2010   normal LV function, EF 60-65%, mild left atrial enlargement  . HTN (hypertension) 03/2010   hospitalization for HTN urgency  . Hyperlipemia   . Internal hemorrhoids   . Lumbar degenerative disc disease   . Migraine   . Myalgia   . Numbness and tingling   . Spondylosis, cervical   . Umbilical hernia   . Uterine fibroid    hx/o     Family History  Problem Relation Age of Onset  . Hypertension Mother   . Dementia Mother   . Colon cancer Mother 61  . Deep vein thrombosis Father   . Hypertension Father   . Cancer Father        prostate CA  . Heart disease Father 17       CAD  . Parkinsonism Father   . Hypertension Sister   . Hyperlipidemia Sister   . Sjogren's syndrome Sister   . Hypertension Brother   . Hyperlipidemia Brother   . Hypertension Maternal Aunt   . Hyperlipidemia Maternal Aunt   . Rheum arthritis Maternal Aunt   . Diabetes Maternal Aunt        1 with type II, 1 with type 1  . Hypertension Brother   . Rheum arthritis Brother   . Diabetes Paternal Aunt        type II  . Breast cancer Neg Hx    Past Surgical History:  Procedure Laterality Date  . ABDOMINAL HYSTERECTOMY    . CERVICAL FUSION  2010  . CERVICAL SPINE SURGERY    . CERVIX LESION DESTRUCTION  1988  . CESAREAN SECTION    .  CHOLECYSTECTOMY  2003  . COLONOSCOPY  08/2010   Dr. Collene Mares  . COLONOSCOPY  02/2017   Dr. Collene Mares  . ESOPHAGOGASTRODUODENOSCOPY  08/2010   Dr. Collene Mares  . EXPLORATORY LAPAROTOMY    . HERNIA REPAIR    . MYOMECTOMY  1998  . PELVIC LAPAROSCOPY    . ROBOTIC ASSISTED LAPAROSCOPIC HYSTERECTOMY AND SALPINGECTOMY  10/2012   [facility]; laproscopic due to fibroids  . 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  Has 66 yo daughter.  Her siblings and her don't agree on helping take care of their parents, causes family tension.  As of 11/2014      Right-handed.  0.5 cup caffeine per day.   Her father lives with her.      Update 09/2019--father passed away from urosepsis in 12-Oct-2019   Immunization History  Administered Date(s) Administered  . Influenza,inj,Quad PF,6+ Mos 05/01/2013, 03/31/2014, 05/17/2017, 05/21/2018, 03/29/2019  . PFIZER SARS-COV-2 Vaccination 08/24/2019, October 12, 2019  . Td 02/04/2012     Objective: Vital Signs: BP 113/77 (BP Location: Left Arm, Patient Position: Sitting, Cuff Size: Small)   Pulse 60   Ht 5\' 4"  (1.626 m)   Wt 129 lb 3.2 oz (58.6 kg)   LMP 10/25/2012 (Approximate)   BMI 22.18 kg/m    Physical Exam Vitals and nursing note reviewed.  Constitutional:      Appearance: She is well-developed.  HENT:     Head: Normocephalic and atraumatic.  Eyes:     Conjunctiva/sclera: Conjunctivae normal.  Cardiovascular:     Rate and Rhythm: Normal rate and regular rhythm.     Heart sounds: Normal heart sounds.  Pulmonary:     Effort: Pulmonary effort is normal.     Breath sounds: Normal breath sounds.  Abdominal:     General: Bowel sounds are normal.     Palpations:  Abdomen is soft.  Musculoskeletal:     Cervical back: Normal range of motion.  Lymphadenopathy:     Cervical: No cervical adenopathy.  Skin:    General: Skin is warm and dry.     Capillary Refill: Capillary refill takes less than 2 seconds.  Neurological:     Mental Status: She is alert and oriented to person, place, and time.  Psychiatric:        Behavior: Behavior normal.      Musculoskeletal Exam: C-spine thoracic and lumbar spine were in good range of motion.  Shoulder joints, elbow joints, wrist joints with good range of motion.  She had no synovitis over MCPs or PIPs.  Hip joints, knee joints, ankles with good range of motion with no synovitis.  CDAI Exam: CDAI Score: -- Patient Global: --; Provider Global: -- Swollen: --; Tender: -- Joint Exam 03/19/2020   No joint exam has been documented for this visit   There is currently no information documented on the homunculus. Go to the Rheumatology activity and complete the homunculus joint exam.  Investigation: No additional findings.  Imaging: XR Lumbar Spine 2-3 Views  Result Date: 03/16/2020 X-rays lumbar spine reveal a mild thoracolumbar scoliosis.  Disc spaces are well-preserved.  No sign of compression fracture or neoplasm.  Surgical gallbladder clips are present.  SI joints are unremarkable, hip joints are well-preserved.   Recent Labs: Lab Results  Component Value Date   WBC 5.4 11/26/2019   HGB 12.5 11/26/2019   PLT 187 11/26/2019   NA 138 11/26/2019   K 4.0 11/26/2019   CL 103 11/26/2019   CO2 21 (L) 11/26/2019   GLUCOSE 117 (H) 11/26/2019   BUN 13 11/26/2019   CREATININE 1.05 (H) 11/26/2019   BILITOT 0.7 11/19/2019   ALKPHOS 69 11/19/2019   AST 37 11/19/2019   ALT 25 11/19/2019   PROT 8.4 (H) 11/19/2019   ALBUMIN 5.1 (H) 11/19/2019   CALCIUM 10.0 11/26/2019   GFRAA >60 11/26/2019    Speciality Comments: PLQ Eye Exam: 06/04/2019 WNL @ [facility]. Follow up in 1 year.  Procedures:  No  procedures performed Allergies: Quinolones, Latex, Levaquin [levofloxacin], Losartan, Metoprolol, Penicillins, Percocet [oxycodone-acetaminophen], Sulfa drugs cross reactors, Vicodin [hydrocodone-acetaminophen], and Gluten meal   Assessment / Plan:     Visit  Diagnoses: Autoimmune disease ([place]) - ANA 1:320 NO, ds8, RF-,CCP-,History of nasal ulcers and oral ulcers in the past, positive synovitis in left hand MCPs on [place] previously: -Patient states she has been under a lot of stress and has been feeling tired.  She has occasional oral ulcers.  She denies any joint swelling.  She had no synovitis on examination.  Will obtain following labs today.  Plan: Urinalysis, Routine w reflex microscopic, ANA, Anti-DNA antibody, double-stranded, C3 and C4, Sedimentation rate, Sjogrens syndrome-A extractable nuclear antibody, Sjogrens syndrome-B extractable nuclear antibody  High risk medication use - PLQ 200 mg 1 tablet by mouth daily twice daily M-F only.PLQ Eye Exam: 06/04/2019 WNL @ [facility]. Follow up in 1 year.  -Repeat eye examination is due in December.  Eye examination form was given.  We will get labs today.  Plan: CBC with Differential/Platelet, COMPLETE METABOLIC PANEL WITH GFR  Primary osteoarthritis of both hands-she has some stiffness in her hands but no synovitis was noted.  Primary osteoarthritis of both knees-she continues to have some knee joint discomfort but no swelling was noted.  Myofascial muscle pain-she has generalized pain and myalgias.  Trochanteric bursitis of both hips-she has some ongoing discomfort.  Advised stretches and exercises.  Family history of lupus erythematosus  Family history of rheumatoid arthritis  History of anxiety-she has been experiencing increased anxiety and has been taking medications.  History of hyperlipidemia  History of gastroesophageal reflux (GERD)  History of hypertension-blood pressure is well controlled.  History of anemia  Educated  about COVID-19 virus infection-she was advised to get booster vaccination.  Use of mask, social distancing and hand hygiene was discussed.  She was also advised in case she develops COVID-19 infection she may be eligible for getting monoclonal antibody infusion.  Orders: Orders Placed This Encounter  Procedures  . CBC with Differential/Platelet  . COMPLETE METABOLIC PANEL WITH GFR  . Urinalysis, Routine w reflex microscopic  . ANA  . Anti-DNA antibody, double-stranded  . C3 and C4  . Sedimentation rate  . Sjogrens syndrome-A extractable nuclear antibody  . Sjogrens syndrome-B extractable nuclear antibody   No orders of the defined types were placed in this encounter.    Follow-Up Instructions: Return in about 5 months (around 08/19/2020) for Autoimmune disease, Osteoarthritis.   [name], MD  Note - This record has been created using Editor, commissioning.  Chart creation errors have been sought, but may not always  have been located. Such creation errors do not reflect on  the standard of medical care.

## 2020-03-16 ENCOUNTER — Ambulatory Visit: Payer: Self-pay

## 2020-03-16 ENCOUNTER — Ambulatory Visit (INDEPENDENT_AMBULATORY_CARE_PROVIDER_SITE_OTHER): Payer: BC Managed Care – PPO | Admitting: Family Medicine

## 2020-03-16 ENCOUNTER — Encounter: Payer: Self-pay | Admitting: Family Medicine

## 2020-03-16 ENCOUNTER — Other Ambulatory Visit: Payer: Self-pay

## 2020-03-16 DIAGNOSIS — M545 Low back pain, unspecified: Secondary | ICD-10-CM

## 2020-03-16 DIAGNOSIS — M542 Cervicalgia: Secondary | ICD-10-CM | POA: Diagnosis not present

## 2020-03-16 DIAGNOSIS — G8929 Other chronic pain: Secondary | ICD-10-CM

## 2020-03-16 NOTE — Progress Notes (Signed)
Takes tylenol No known injury Right sided neck and low back pain Pain for at least a year  Has tried PT but had to stop due to anxiety

## 2020-03-16 NOTE — Progress Notes (Signed)
Office Visit Note   Patient: Shari Lawrence           Date of Birth: 08/01/1960           MRN: [id number] Visit Date: 03/16/2020 Requested by: Carlena Hurl, PA-C [address] [place],  [place] [address] PCP: Carlena Hurl, PA-C  Subjective: Chief Complaint  Patient presents with  . Lower Back - Pain  . Neck - Pain    HPI: She is here with right-sided lower back pain.  Intermittent pain for quite a while.  Pain is worse when standing a lot or when sitting with bad posture.  She also continues to struggle with neck pain.  She did physical therapy for a while but then stopped when her father died.               ROS:   All other systems were reviewed and are negative.  Objective: Vital Signs: LMP 10/25/2012 (Approximate)   Physical Exam:  General:  Alert and oriented, in no acute distress. Pulm:  Breathing unlabored. Psy:  Normal mood, congruent affect. Skin: Neck: Primarily tender to the left of midline in the paraspinous muscles, C5-6 and C6-7 level. Lower back: She has a mild thoracolumbar scoliosis.  She is tender along the quadratus lumborum, especially near the iliac crest.  No pain over the SI joints or along the spinous processes.  Lower extremity strength and reflexes remain normal.  Imaging: XR Lumbar Spine 2-3 Views  Result Date: 03/16/2020 X-rays lumbar spine reveal a mild thoracolumbar scoliosis.  Disc spaces are well-preserved.  No sign of compression fracture or neoplasm.  Surgical gallbladder clips are present.  SI joints are unremarkable, hip joints are well-preserved.   Assessment & Plan: 1.  Chronic right-sided low back pain, suspect muscular pain possibly related to scoliosis. -We will try chiropractic per Dr. Jene Every.  If she fails to improve after a few weeks, she will contact me and I will order lumbar MRI scan.  2.  Chronic neck pain - Gentle chiropractic.  MRI if fails to improve.     Procedures: No procedures performed    No notes on file     PMFS History: Patient Active Problem List   Diagnosis Date Noted  . History of recent stressful life event 11/20/2019  . Panic attack 11/15/2019  . Fall 11/13/2019  . Pain in both hands 11/13/2019  . Sleep disturbance 11/13/2019  . Grief 11/13/2019  . Bilateral carpal tunnel syndrome 08/21/2019  . Chronic pain of both knees 06/14/2019  . Right arm pain 06/14/2019  . Close exposure to COVID-19 virus 06/14/2019  . Paresthesia 04/08/2019  . Neck pain 03/30/2019  . Radicular pain in right arm 03/30/2019  . Numbness of leg 03/30/2019  . Acute stress reaction 07/24/2018  . Chest pain 07/24/2018  . Hypotension 07/17/2018  . Vertigo 07/17/2018  . Nausea 07/17/2018  . Vaccine counseling 05/21/2018  . Need for influenza vaccination 05/21/2018  . Routine general medical examination at a health care facility 05/21/2018  . Caregiver burden 07/05/2017  . Cyst of skin 07/05/2017  . Elevated blood pressure reading 06/28/2017  . Leg pain, diffuse, left 06/28/2017  . Leg swelling 06/28/2017  . Bruising 06/28/2017  . Myofascial muscle pain 06/28/2017  . High risk medication use 03/30/2017  . Chronic constipation 11/17/2016  . Globus sensation 11/17/2016  . Primary osteoarthritis of both knees 10/05/2016  . Autoimmune disease ([place]) 09/29/2016  . Primary osteoarthritis of both hands 09/29/2016  .  Family history of lupus erythematosus 09/20/2016  . Polyarthralgia 05/06/2016  . Clenching of teeth 05/06/2016  . Finger swelling 05/06/2016  . Morning stiffness of joints 05/06/2016  . Myalgia 05/06/2016  . Family history of rheumatoid arthritis 05/06/2016  . Essential hypertension 12/23/2014  . History of anemia 12/23/2014  . Care provider for parents 12/23/2014  . Post-operative state 11/13/2012  . Hyperlipidemia 11/11/2012  . Hypertension 11/11/2012  . Impaired fasting glucose 10/25/2011  . Gluten intolerance 10/25/2011  . Gastroesophageal reflux disease without  esophagitis 10/25/2011  . Anxiety   . Iron deficiency anemia    Past Medical History:  Diagnosis Date  . Abnormal Pap smear 1988   treated with cryo  . Anemia 11/2010   hematology consult prior; etiology malabsorption and uterine bleeding  . Anxiety   . Gastric polyp   . GERD (gastroesophageal reflux disease)   . H/O bone density study 12/2007  . H/O hysterectomy for benign disease 5/14  . History of echocardiogram 03/2010   normal LV function, EF 60-65%, mild left atrial enlargement  . HTN (hypertension) 03/2010   hospitalization for HTN urgency  . Hyperlipemia   . Internal hemorrhoids   . Lumbar degenerative disc disease   . Migraine   . Myalgia   . Numbness and tingling   . Spondylosis, cervical   . Umbilical hernia   . Uterine fibroid    hx/o     Family History  Problem Relation Age of Onset  . Hypertension Mother   . Dementia Mother   . Colon cancer Mother 80  . Deep vein thrombosis Father   . Hypertension Father   . Cancer Father        prostate CA  . Heart disease Father 47       CAD  . Parkinsonism Father   . Hypertension Sister   . Hyperlipidemia Sister   . Hypertension Brother   . Hyperlipidemia Brother   . Hypertension Maternal Aunt   . Hyperlipidemia Maternal Aunt   . Rheum arthritis Maternal Aunt   . Diabetes Maternal Aunt        1 with type II, 1 with type 1  . Hypertension Brother   . Rheum arthritis Brother   . Diabetes Paternal Aunt        type II  . Breast cancer Neg Hx     Past Surgical History:  Procedure Laterality Date  . ABDOMINAL HYSTERECTOMY    . CERVICAL FUSION  2010  . CERVICAL SPINE SURGERY    . CERVIX LESION DESTRUCTION  1988  . CESAREAN SECTION    . CHOLECYSTECTOMY  2003  . COLONOSCOPY  08/2010   Dr. Collene Mares  . COLONOSCOPY  02/2017   Dr. Collene Mares  . ESOPHAGOGASTRODUODENOSCOPY  08/2010   Dr. Collene Mares  . EXPLORATORY LAPAROTOMY    . HERNIA REPAIR    . MYOMECTOMY  1998  . PELVIC LAPAROSCOPY    . ROBOTIC ASSISTED LAPAROSCOPIC  HYSTERECTOMY AND SALPINGECTOMY  10/2012   [facility]; laproscopic due to fibroids  . UMBILICAL HERNIA REPAIR  10/2013   infected laparoscopic port, mesh placement   Social History   Occupational History    Employer: [employer]    Comment: molecular Production designer, theatre/television/film, PhD candidate, [employer]  Tobacco Use  . Smoking status: Never Smoker  . Smokeless tobacco: Never Used  Vaping Use  . Vaping Use: Never used  Substance and Sexual Activity  . Alcohol use: No  . Drug use: No  . Sexual  activity: Not Currently    Partners: Male    Birth control/protection: Surgical    Comment: hysterectomy

## 2020-03-18 NOTE — Progress Notes (Signed)
I have reviewed and agreed above plan. 

## 2020-03-19 ENCOUNTER — Encounter: Payer: Self-pay | Admitting: Rheumatology

## 2020-03-19 ENCOUNTER — Ambulatory Visit (INDEPENDENT_AMBULATORY_CARE_PROVIDER_SITE_OTHER): Payer: BC Managed Care – PPO | Admitting: Rheumatology

## 2020-03-19 ENCOUNTER — Telehealth: Payer: Self-pay | Admitting: Family Medicine

## 2020-03-19 ENCOUNTER — Other Ambulatory Visit: Payer: Self-pay

## 2020-03-19 VITALS — BP 113/77 | HR 60 | Ht 64.0 in | Wt 129.2 lb

## 2020-03-19 DIAGNOSIS — M19042 Primary osteoarthritis, left hand: Secondary | ICD-10-CM

## 2020-03-19 DIAGNOSIS — M7061 Trochanteric bursitis, right hip: Secondary | ICD-10-CM

## 2020-03-19 DIAGNOSIS — Z79899 Other long term (current) drug therapy: Secondary | ICD-10-CM

## 2020-03-19 DIAGNOSIS — Z840 Family history of diseases of the skin and subcutaneous tissue: Secondary | ICD-10-CM

## 2020-03-19 DIAGNOSIS — Z8659 Personal history of other mental and behavioral disorders: Secondary | ICD-10-CM

## 2020-03-19 DIAGNOSIS — M170 Bilateral primary osteoarthritis of knee: Secondary | ICD-10-CM

## 2020-03-19 DIAGNOSIS — Z8679 Personal history of other diseases of the circulatory system: Secondary | ICD-10-CM

## 2020-03-19 DIAGNOSIS — Z7189 Other specified counseling: Secondary | ICD-10-CM

## 2020-03-19 DIAGNOSIS — Z8261 Family history of arthritis: Secondary | ICD-10-CM

## 2020-03-19 DIAGNOSIS — Z862 Personal history of diseases of the blood and blood-forming organs and certain disorders involving the immune mechanism: Secondary | ICD-10-CM

## 2020-03-19 DIAGNOSIS — M359 Systemic involvement of connective tissue, unspecified: Secondary | ICD-10-CM

## 2020-03-19 DIAGNOSIS — M7062 Trochanteric bursitis, left hip: Secondary | ICD-10-CM

## 2020-03-19 DIAGNOSIS — M19041 Primary osteoarthritis, right hand: Secondary | ICD-10-CM

## 2020-03-19 DIAGNOSIS — Z8719 Personal history of other diseases of the digestive system: Secondary | ICD-10-CM

## 2020-03-19 DIAGNOSIS — Z8639 Personal history of other endocrine, nutritional and metabolic disease: Secondary | ICD-10-CM

## 2020-03-19 DIAGNOSIS — M7918 Myalgia, other site: Secondary | ICD-10-CM

## 2020-03-19 NOTE — Telephone Encounter (Signed)
Are you able to do this

## 2020-03-19 NOTE — Telephone Encounter (Signed)
Patient called. Says she accidentally deleted the xray. Would like Dr. Junius Roads to e-mail her the xray's. [email or web address]

## 2020-03-19 NOTE — Patient Instructions (Signed)
Standing Labs We placed an order today for your standing lab work.   Please have your standing labs drawn in February  If possible, please have your labs drawn 2 weeks prior to your appointment so that the provider can discuss your results at your appointment.  We have open lab daily Monday through Thursday from 8:30-12:30 PM and 1:30-4:30 PM and Friday from 8:30-12:30 PM and 1:30-4:00 PM at the office of Dr. [name], [facility].   Please be advised, patients with office appointments requiring lab work will take precedents over walk-in lab work.  If possible, please come for your lab work on Monday and Friday afternoons, as you may experience shorter wait times. The office is located at [address], [place], [place], [place] [address] No appointment is necessary.   Labs are drawn by [facility]. Please bring your co-pay at the time of your lab draw.  You may receive a bill from [facility] for your lab work.  If you wish to have your labs drawn at another location, please call the office 24 hours in advance to send orders.  If you have any questions regarding directions or hours of operation,  please call [phone].   As a reminder, please drink plenty of water prior to coming for your lab work. Thanks!   COVID-19 vaccine recommendations:   COVID-19 vaccine is recommended for everyone (unless you are allergic to a vaccine component), even if you are on a medication that suppresses your immune system.     Do not take Tylenol or any anti-inflammatory medications (NSAIDs) 24 hours prior to the COVID-19 vaccination.   There is no direct evidence about the efficacy of the COVID-19 vaccine in individuals who are on medications that suppress the immune system.   Even if you are fully vaccinated, and you are on any medications that suppress your immune system, please continue to wear a mask, maintain at least six feet social distance and practice hand hygiene.   If you  develop a COVID-19 infection, please contact your PCP or our office to determine if you need antibody infusion.  The booster vaccine is now available for immunocompromised patients. It is advised that if you had Pfizer vaccine you should get Coca-Cola booster.  If you had a Moderna vaccine then you should get a Moderna booster. Johnson and Wynetta Emery does not have a booster vaccine at this time.  Please see the following web sites for updated information.   [email or web address]://[email or web address]/[email or web address]/COVID-19-Vaccination-Patient-Resources.[email or web address]  https://[email or web address]/About-Us/Newsroom/Press-Releases/ID/1159

## 2020-03-20 NOTE — Progress Notes (Signed)
Labs are stable and do not indicate lupus flare.  No change in treatment advised.

## 2020-03-20 NOTE — Telephone Encounter (Signed)
Done

## 2020-03-24 LAB — C3 AND C4
C3 Complement: 116 mg/dL (ref 83–193)
C4 Complement: 22 mg/dL (ref 15–57)

## 2020-03-24 LAB — CBC WITH DIFFERENTIAL/PLATELET
Absolute Monocytes: 377 cells/uL (ref 200–950)
Basophils Absolute: 29 cells/uL (ref 0–200)
Basophils Relative: 0.7 %
Eosinophils Absolute: 119 cells/uL (ref 15–500)
Eosinophils Relative: 2.9 %
HCT: 37.4 % (ref 35.0–45.0)
Hemoglobin: 12 g/dL (ref 11.7–15.5)
Lymphs Abs: 1419 cells/uL (ref 850–3900)
MCH: 30.2 pg (ref 27.0–33.0)
MCHC: 32.1 g/dL (ref 32.0–36.0)
MCV: 94 fL (ref 80.0–100.0)
MPV: 12.6 fL — ABNORMAL HIGH (ref 7.5–12.5)
Monocytes Relative: 9.2 %
Neutro Abs: 2157 cells/uL (ref 1500–7800)
Neutrophils Relative %: 52.6 %
Platelets: 169 10*3/uL (ref 140–400)
RBC: 3.98 10*6/uL (ref 3.80–5.10)
RDW: 13.1 % (ref 11.0–15.0)
Total Lymphocyte: 34.6 %
WBC: 4.1 10*3/uL (ref 3.8–10.8)

## 2020-03-24 LAB — ANTI-NUCLEAR AB-TITER (ANA TITER)
ANA TITER: 1:80 {titer} — ABNORMAL HIGH
ANA Titer 1: 1:80 {titer} — ABNORMAL HIGH

## 2020-03-24 LAB — COMPLETE METABOLIC PANEL WITH GFR
AG Ratio: 1.9 (calc) (ref 1.0–2.5)
ALT: 28 U/L (ref 6–29)
AST: 31 U/L (ref 10–35)
Albumin: 4.6 g/dL (ref 3.6–5.1)
Alkaline phosphatase (APISO): 80 U/L (ref 37–153)
BUN: 13 mg/dL (ref 7–25)
CO2: 29 mmol/L (ref 20–32)
Calcium: 9.6 mg/dL (ref 8.6–10.4)
Chloride: 107 mmol/L (ref 98–110)
Creat: 0.92 mg/dL (ref 0.50–1.05)
GFR, Est African American: 80 mL/min/{1.73_m2} (ref >=60)
GFR, Est Non African American: 69 mL/min/{1.73_m2} (ref >=60)
Globulin: 2.4 g/dL (calc) (ref 1.9–3.7)
Glucose, Bld: 89 mg/dL (ref 65–99)
Potassium: 3.7 mmol/L (ref 3.5–5.3)
Sodium: 143 mmol/L (ref 135–146)
Total Bilirubin: 0.4 mg/dL (ref 0.2–1.2)
Total Protein: 7 g/dL (ref 6.1–8.1)

## 2020-03-24 LAB — URINALYSIS, ROUTINE W REFLEX MICROSCOPIC
Bacteria, UA: NONE SEEN /HPF
Bilirubin Urine: NEGATIVE
Glucose, UA: NEGATIVE
Hgb urine dipstick: NEGATIVE
Hyaline Cast: NONE SEEN /LPF
Leukocytes,Ua: NEGATIVE
Nitrite: NEGATIVE
RBC / HPF: NONE SEEN /HPF (ref 0–2)
Specific Gravity, Urine: 1.027 (ref 1.001–1.03)
Squamous Epithelial / HPF: NONE SEEN /HPF (ref <=5)
WBC, UA: NONE SEEN /HPF (ref 0–5)
pH: 5.5 (ref 5.0–8.0)

## 2020-03-24 LAB — SJOGRENS SYNDROME-A EXTRACTABLE NUCLEAR ANTIBODY: SSA (Ro) (ENA) Antibody, IgG: <1 AI

## 2020-03-24 LAB — SJOGRENS SYNDROME-B EXTRACTABLE NUCLEAR ANTIBODY: SSB (La) (ENA) Antibody, IgG: <1 AI

## 2020-03-24 LAB — ANTI-DNA ANTIBODY, DOUBLE-STRANDED: ds DNA Ab: 7 IU/mL — ABNORMAL HIGH

## 2020-03-24 LAB — ANA: Anti Nuclear Antibody (ANA): POSITIVE — AB

## 2020-03-24 LAB — SEDIMENTATION RATE: Sed Rate: 9 mm/h (ref 0–30)

## 2020-03-25 ENCOUNTER — Telehealth: Payer: Self-pay | Admitting: Family Medicine

## 2020-03-25 NOTE — Telephone Encounter (Signed)
Patient states she came into the office an signed a record release for Dr Noberto Retort to receive her office notes & xray's from office visit 03/16/20 with Dr.Hilts. Pt states Dr.Gibson still have not received the records. Can you please check on the status of this record release and send to [facility] if not already done    Dr.Gibson @ [facility]  Fax [phone]

## 2020-03-25 NOTE — Telephone Encounter (Signed)
I tried calling the patient back, just to let her know this has been done - no answer and voice mail box has not been set up.

## 2020-03-25 NOTE — Telephone Encounter (Signed)
Release form signed/received 9/23. Records faxed today [phone]

## 2020-03-26 ENCOUNTER — Encounter: Payer: Self-pay | Admitting: Medical

## 2020-04-09 ENCOUNTER — Other Ambulatory Visit: Payer: Self-pay | Admitting: Medical

## 2020-04-10 ENCOUNTER — Ambulatory Visit: Payer: BC Managed Care – PPO | Admitting: Medical

## 2020-04-10 ENCOUNTER — Encounter: Payer: Self-pay | Admitting: Medical

## 2020-04-10 ENCOUNTER — Other Ambulatory Visit: Payer: Self-pay

## 2020-04-10 VITALS — BP 134/82 | HR 57 | Ht 64.0 in | Wt 130.2 lb

## 2020-04-10 DIAGNOSIS — M2550 Pain in unspecified joint: Secondary | ICD-10-CM

## 2020-04-10 DIAGNOSIS — E785 Hyperlipidemia, unspecified: Secondary | ICD-10-CM | POA: Diagnosis not present

## 2020-04-10 DIAGNOSIS — Z23 Encounter for immunization: Secondary | ICD-10-CM

## 2020-04-10 DIAGNOSIS — K5909 Other constipation: Secondary | ICD-10-CM

## 2020-04-10 DIAGNOSIS — R7301 Impaired fasting glucose: Secondary | ICD-10-CM | POA: Diagnosis not present

## 2020-04-10 DIAGNOSIS — I10 Essential (primary) hypertension: Secondary | ICD-10-CM | POA: Diagnosis not present

## 2020-04-10 DIAGNOSIS — F419 Anxiety disorder, unspecified: Secondary | ICD-10-CM

## 2020-04-10 DIAGNOSIS — M359 Systemic involvement of connective tissue, unspecified: Secondary | ICD-10-CM

## 2020-04-10 DIAGNOSIS — Z79899 Other long term (current) drug therapy: Secondary | ICD-10-CM

## 2020-04-10 MED ORDER — AMLODIPINE BESYLATE 5 MG PO TABS: 5.0000 mg | ORAL_TABLET | Freq: Every day | ORAL | 3 refills | Status: DC

## 2020-04-10 MED ORDER — ESCITALOPRAM OXALATE 10 MG PO TABS: 5.0000 mg | ORAL_TABLET | Freq: Every day | ORAL | 0 refills | Status: DC

## 2020-04-10 MED ORDER — LUBIPROSTONE 24 MCG PO CAPS: 24.0000 ug | ORAL_CAPSULE | Freq: Two times a day (BID) | ORAL | 1 refills | Status: DC

## 2020-04-10 NOTE — Progress Notes (Signed)
Established patient visit   Patient: Shari Lawrence   DOB: August 01, 1960   59 y.o. Female  MRN: [id number] Visit Date: 04/10/2020  Today's healthcare provider: Dorothea Ogle, PA-C   Chief Complaint  Patient presents with  . Hypertension    follow up    Subjective    HPI HPI    Hypertension     Additional comments: follow up        Last edited by Shari Lawrence, CMA on 04/10/2020  2:43 PM. (History)      Hypertension, follow-up  BP Readings from Last 3 Encounters:  04/10/20 134/82  03/19/20 113/77  01/23/20 (!) 100/54   Wt Readings from Last 3 Encounters:  04/10/20 130 lb 3.2 oz (59.1 kg)  03/19/20 129 lb 3.2 oz (58.6 kg)  01/23/20 123 lb (55.8 kg)     She was last seen for hypertension 4 months ago.  BP at that visit was 140/86. Management since that visit includes Amlodipine 5 mg  .  She reports good compliance with treatment. She is not having side effects.  She is following a Regular diet. She is exercising. She does not smoke.  Use of agents associated with hypertension: none.   Outside blood pressures are between 110/70-135/88 Symptoms: No chest pain No chest pressure  No palpitations No syncope  No dyspnea No orthopnea  No paroxysmal nocturnal dyspnea No lower extremity edema   Pertinent labs: Lab Results  Component Value Date   CHOL 169 05/21/2018   HDL 77 05/21/2018   LDLCALC 82 05/21/2018   TRIG 52 05/21/2018   CHOLHDL 2.2 05/21/2018   Lab Results  Component Value Date   NA 143 03/19/2020   K 3.7 03/19/2020   CREATININE 0.92 03/19/2020   GFRNONAA 69 03/19/2020   GFRAA 80 03/19/2020   GLUCOSE 89 03/19/2020     The 10-year ASCVD risk score Shari Lawrence., et al., 2013) is: 4.6%    Seeing chiropractor starting today, referred by Dr. Junius Lawrence, ortho regarding hip and back.  Hyperlipidemia - compliant with medication    Medications: Outpatient Medications Prior to Visit  Medication Sig  . acetaminophen (TYLENOL) 500 MG  tablet Take 1 tablet (500 mg total) by mouth every 6 (six) hours as needed.  . ALPRAZolam (XANAX) 0.5 MG tablet Take 0.5 mg by mouth 2 (two) times daily as needed.  . Ascorbic Acid (VITAMIN C) 1000 MG tablet Take 1,000 mg by mouth daily.   Marland Kitchen aspirin EC 81 MG tablet Take 81 mg by mouth daily.  Marland Kitchen atorvastatin (LIPITOR) 20 MG tablet TAKE 1 TABLET BY MOUTH EVERY DAY (Patient taking differently: Take 20 mg by mouth daily. )  . calcium carbonate (OS-CAL) 600 MG TABS Take 600 mg by mouth daily.   Marland Kitchen CINNAMON PO Take 1 tablet by mouth daily.   . diclofenac sodium (VOLTAREN) 1 % GEL 3 grams to 3 large joints up to 3 times daily  . estradiol (VIVELLE-DOT) 0.0375 MG/24HR Place 1 patch onto the skin 2 (two) times a week. (Patient taking differently: Place 1 patch onto the skin 2 (two) times a week. Wednesday and Sunday)  . fish oil-omega-3 fatty acids 1000 MG capsule Take 2 g by mouth daily.  . hydroxychloroquine (PLAQUENIL) 200 MG tablet Take one tablet by mouth twice daily Monday- Friday only  . Multiple Vitamin (MULTIVITAMIN) tablet Take 1 tablet by mouth daily.   Marland Kitchen NEXIUM 40 MG capsule Take 40 mg by mouth daily.   . Probiotic  Product (PROBIOTIC ADVANCED PO) Take 1 tablet by mouth daily.   . TURMERIC PO Take 1 tablet by mouth daily.   . [DISCONTINUED] amLODipine (NORVASC) 5 MG tablet TAKE 1 TABLET BY MOUTH EVERY DAY  . [DISCONTINUED] escitalopram (LEXAPRO) 10 MG tablet Take 1 tablet (10 mg total) by mouth daily. (Patient taking differently: Take 5 mg by mouth daily. )  . [DISCONTINUED] lubiprostone (AMITIZA) 24 MCG capsule TAKE 1 CAPSULE (24 MCG TOTAL) BY MOUTH 2 (TWO) TIMES DAILY WITH A MEAL.  . B Complex Vitamins (B COMPLEX PO) Take 1 tablet by mouth daily.  (Patient not taking: Reported on 04/10/2020)  . [DISCONTINUED] LORazepam (ATIVAN) 0.5 MG tablet Take 1 tablet (0.5 mg total) by mouth every 8 (eight) hours as needed for anxiety. (Patient not taking: Reported on 03/19/2020)   No facility-administered  medications prior to visit.    Review of Systems  As in subjective     Objective    BP 134/82   Pulse (!) 57   Ht 5\' 4"  (1.626 m)   Wt 130 lb 3.2 oz (59.1 kg)   LMP 10/25/2012 (Approximate)   BMI 22.35 kg/m    Wt Readings from Last 3 Encounters:  04/10/20 130 lb 3.2 oz (59.1 kg)  03/19/20 129 lb 3.2 oz (58.6 kg)  01/23/20 123 lb (55.8 kg)   Physical Exam  Gen: wd, wn, nad Heart rrr, normal s1, s2, no murmurs Lungs clear Ext: no edema 2+ pulses     Assessment & Plan   Encounter Diagnoses  Name Primary?  . Essential hypertension Yes  . Impaired fasting glucose   . Hyperlipidemia, unspecified hyperlipidemia type   . Need for influenza vaccination   . Chronic constipation   . Anxiety   . Polyarthralgia   . High risk medication use   . Autoimmune disease ([place])       Hypertension-continue current medication  Impaired glucose-return soon for fasting labs next week  Hyperlipidemia-continue statin  Chronic constipation-refill medication.  She is doing okay on current regimen  Anxiety-recently established with psychiatry.  I advised the psychiatrist should start refilling the medication going forward  Autoimmune disease, polyarthralgia-managed by a specialist  Counseled on the influenza virus vaccine.  Vaccine information sheet given.  Influenza vaccine given after consent obtained.   Shari Lawrence was seen today for hypertension.  Diagnoses and all orders for this visit:  Essential hypertension  Impaired fasting glucose -     Hemoglobin A1c; Future  Hyperlipidemia, unspecified hyperlipidemia type -     Lipid panel; Future  Need for influenza vaccination  Chronic constipation  Anxiety  Polyarthralgia  High risk medication use  Autoimmune disease ([place])  Other orders -     Flu Vaccine QUAD 6+ mos PF IM (Fluarix Quad PF) -     lubiprostone (AMITIZA) 24 MCG capsule; Take 1 capsule (24 mcg total) by mouth 2 (two) times daily with a meal. -      escitalopram (LEXAPRO) 10 MG tablet; Take 0.5 tablets (5 mg total) by mouth daily. -     amLODipine (NORVASC) 5 MG tablet; Take 1 tablet (5 mg total) by mouth daily.   F/u for fasting labs at her convenience next week

## 2020-04-16 ENCOUNTER — Encounter (HOSPITAL_COMMUNITY): Payer: Self-pay | Admitting: Emergency Medicine

## 2020-04-16 ENCOUNTER — Emergency Department (HOSPITAL_COMMUNITY): Payer: BC Managed Care – PPO

## 2020-04-16 ENCOUNTER — Other Ambulatory Visit: Payer: Self-pay

## 2020-04-16 ENCOUNTER — Encounter (HOSPITAL_COMMUNITY): Payer: Self-pay | Admitting: *Deleted

## 2020-04-16 ENCOUNTER — Ambulatory Visit (HOSPITAL_COMMUNITY)
Admission: EM | Admit: 2020-04-16 | Discharge: 2020-04-16 | Disposition: A | Discharge status: Home / Self Care | Payer: BC Managed Care – PPO

## 2020-04-16 ENCOUNTER — Other Ambulatory Visit: Payer: BC Managed Care – PPO

## 2020-04-16 ENCOUNTER — Emergency Department (HOSPITAL_COMMUNITY)
Admission: EM | Admit: 2020-04-16 | Discharge: 2020-04-16 | Disposition: A | Discharge status: Home / Self Care | Payer: BC Managed Care – PPO | Attending: Emergency Medicine | Admitting: Emergency Medicine

## 2020-04-16 DIAGNOSIS — R202 Paresthesia of skin: Secondary | ICD-10-CM | POA: Insufficient documentation

## 2020-04-16 DIAGNOSIS — R2990 Unspecified symptoms and signs involving the nervous system: Secondary | ICD-10-CM | POA: Diagnosis not present

## 2020-04-16 DIAGNOSIS — Z91040 Latex allergy status: Secondary | ICD-10-CM | POA: Insufficient documentation

## 2020-04-16 DIAGNOSIS — I10 Essential (primary) hypertension: Secondary | ICD-10-CM | POA: Diagnosis not present

## 2020-04-16 DIAGNOSIS — R7301 Impaired fasting glucose: Secondary | ICD-10-CM

## 2020-04-16 DIAGNOSIS — Z7982 Long term (current) use of aspirin: Secondary | ICD-10-CM | POA: Diagnosis not present

## 2020-04-16 DIAGNOSIS — R200 Anesthesia of skin: Secondary | ICD-10-CM

## 2020-04-16 DIAGNOSIS — Z79899 Other long term (current) drug therapy: Secondary | ICD-10-CM | POA: Diagnosis not present

## 2020-04-16 DIAGNOSIS — E785 Hyperlipidemia, unspecified: Secondary | ICD-10-CM

## 2020-04-16 LAB — CBC WITH DIFFERENTIAL/PLATELET
Abs Immature Granulocytes: 0.01 10*3/uL (ref 0.00–0.07)
Basophils Absolute: 0 10*3/uL (ref 0.0–0.1)
Basophils Relative: 1 %
Eosinophils Absolute: 0.2 10*3/uL (ref 0.0–0.5)
Eosinophils Relative: 3 %
HCT: 40 % (ref 36.0–46.0)
Hemoglobin: 12.6 g/dL (ref 12.0–15.0)
Immature Granulocytes: 0 %
Lymphocytes Relative: 43 %
Lymphs Abs: 2.2 10*3/uL (ref 0.7–4.0)
MCH: 29.8 pg (ref 26.0–34.0)
MCHC: 31.5 g/dL (ref 30.0–36.0)
MCV: 94.6 fL (ref 80.0–100.0)
Monocytes Absolute: 0.5 10*3/uL (ref 0.1–1.0)
Monocytes Relative: 9 %
Neutro Abs: 2.2 10*3/uL (ref 1.7–7.7)
Neutrophils Relative %: 44 %
Platelets: 185 10*3/uL (ref 150–400)
RBC: 4.23 MIL/uL (ref 3.87–5.11)
RDW: 13.6 % (ref 11.5–15.5)
WBC: 5.1 10*3/uL (ref 4.0–10.5)
nRBC: 0 % (ref 0.0–0.2)

## 2020-04-16 LAB — COMPREHENSIVE METABOLIC PANEL
ALT: 32 U/L (ref 0–44)
AST: 34 U/L (ref 15–41)
Albumin: 4.4 g/dL (ref 3.5–5.0)
Alkaline Phosphatase: 72 U/L (ref 38–126)
Anion gap: 13 (ref 5–15)
BUN: 10 mg/dL (ref 6–20)
CO2: 23 mmol/L (ref 22–32)
Calcium: 9.8 mg/dL (ref 8.9–10.3)
Chloride: 105 mmol/L (ref 98–111)
Creatinine, Ser: 0.86 mg/dL (ref 0.44–1.00)
GFR, Estimated: >60 mL/min (ref >60)
Glucose, Bld: 97 mg/dL (ref 70–99)
Potassium: 3.8 mmol/L (ref 3.5–5.1)
Sodium: 141 mmol/L (ref 135–145)
Total Bilirubin: 0.6 mg/dL (ref 0.3–1.2)
Total Protein: 7.2 g/dL (ref 6.5–8.1)

## 2020-04-16 MED ORDER — PROCHLORPERAZINE EDISYLATE 10 MG/2ML IJ SOLN
10.0000 mg | Freq: Once | INTRAMUSCULAR | Status: AC
  Administered 2020-04-16: 10 mg via INTRAVENOUS
  Filled 2020-04-16: qty 2

## 2020-04-16 MED ORDER — PROCHLORPERAZINE MALEATE 5 MG PO TABS: 10.0000 mg | ORAL_TABLET | Freq: Once | ORAL | Status: DC

## 2020-04-16 MED ORDER — LORAZEPAM 2 MG/ML IJ SOLN
2.0000 mg | Freq: Once | INTRAMUSCULAR | Status: AC
  Administered 2020-04-16: 2 mg via INTRAVENOUS
  Filled 2020-04-16: qty 1

## 2020-04-16 MED ORDER — KETOROLAC TROMETHAMINE 15 MG/ML IJ SOLN
15.0000 mg | Freq: Once | INTRAMUSCULAR | Status: AC
  Administered 2020-04-16: 15 mg via INTRAVENOUS
  Filled 2020-04-16: qty 1

## 2020-04-16 MED ORDER — LACTATED RINGERS IV BOLUS
500.0000 mL | Freq: Once | INTRAVENOUS | Status: AC
  Administered 2020-04-16: 500 mL via INTRAVENOUS

## 2020-04-16 MED ORDER — DIPHENHYDRAMINE HCL 50 MG/ML IJ SOLN
12.5000 mg | Freq: Once | INTRAMUSCULAR | Status: AC
  Administered 2020-04-16: 12.5 mg via INTRAVENOUS
  Filled 2020-04-16: qty 1

## 2020-04-16 MED ORDER — GADOBUTROL 1 MMOL/ML IV SOLN
6.0000 mL | Freq: Once | INTRAVENOUS | Status: AC | PRN
  Administered 2020-04-16: 6 mL via INTRAVENOUS

## 2020-04-16 NOTE — Consult Note (Signed)
Neurology Consultation  Reason for Consult: Code stroke Referring Physician: Dr. Marin Roberts, EDP  CC: Left facial numbness  History is obtained from: Patient, chart  HPI: Shari Lawrence is a 59 y.o. female past medical history of cervical radiculopathy, hypertension, hyperlipidemia, prior history of migraines, myalgia, anxiety, autoimmune disease-being worked up with no clear diagnosis, arthritis, presented to the emergency room with last known normal around 9:45 AM when she had a sudden onset of left facial numbness.  She denies any focal weakness.  Denies any visual changes with this. She does report that she has been having a headache now for a couple of days predominantly on the right side of her head.  She got migraines in the past but has not had them for a while.  Reports and heightened anxiety and recent stressors as also reported in her chart. She was evaluated in the emergency room and a code stroke was activated because of unilateral focal symptoms.  She initially went to the urgent care clinic and was sent to the ER from there for the symptoms for more emergent evaluation.  LKW: 9:45 AM on 04/16/2020 tpa given?: no, very low NIH, likely complex migraine Premorbid modified Rankin scale (mRS):0  ROS: Performed and negative except as noted in HPI  Past Medical History:  Diagnosis Date  . Abnormal Pap smear 1988   treated with cryo  . Anemia 11/2010   hematology consult prior; etiology malabsorption and uterine bleeding  . Anxiety   . Gastric polyp   . GERD (gastroesophageal reflux disease)   . H/O bone density study 12/2007  . H/O hysterectomy for benign disease 5/14  . History of echocardiogram 03/2010   normal LV function, EF 60-65%, mild left atrial enlargement  . HTN (hypertension) 03/2010   hospitalization for HTN urgency  . Hyperlipemia   . Internal hemorrhoids   . Lumbar degenerative disc disease   . Migraine   . Myalgia   . Numbness and tingling    . Spondylosis, cervical   . Umbilical hernia   . Uterine fibroid    hx/o    Family History  Problem Relation Age of Onset  . Hypertension Mother   . Dementia Mother   . Colon cancer Mother 38  . Deep vein thrombosis Father   . Hypertension Father   . Cancer Father        prostate CA  . Heart disease Father 7       CAD  . Parkinsonism Father   . Hypertension Sister   . Hyperlipidemia Sister   . Sjogren's syndrome Sister   . Hypertension Brother   . Hyperlipidemia Brother   . Hypertension Maternal Aunt   . Hyperlipidemia Maternal Aunt   . Rheum arthritis Maternal Aunt   . Diabetes Maternal Aunt        1 with type II, 1 with type 1  . Hypertension Brother   . Rheum arthritis Brother   . Diabetes Paternal Aunt        type II  . Breast cancer Neg Hx    Social History:   reports that she has never smoked. She has never used smokeless tobacco. She reports that she does not drink alcohol and does not use drugs.  Medications  Current Facility-Administered Medications:  .  LORazepam (ATIVAN) injection 2 mg, 2 mg, Intravenous, Once, Amie Portland, MD  Current Outpatient Medications:  .  acetaminophen (TYLENOL) 500 MG tablet, Take 1 tablet (500 mg total) by mouth every 6 (  six) hours as needed., Disp: 30 tablet, Rfl: 0 .  ALPRAZolam (XANAX) 0.5 MG tablet, Take 0.5 mg by mouth 2 (two) times daily as needed., Disp: , Rfl:  .  amLODipine (NORVASC) 5 MG tablet, Take 1 tablet (5 mg total) by mouth daily., Disp: 90 tablet, Rfl: 3 .  Ascorbic Acid (VITAMIN C) 1000 MG tablet, Take 1,000 mg by mouth daily. , Disp: , Rfl:  .  aspirin EC 81 MG tablet, Take 81 mg by mouth daily., Disp: , Rfl:  .  atorvastatin (LIPITOR) 20 MG tablet, TAKE 1 TABLET BY MOUTH EVERY DAY (Patient taking differently: Take 20 mg by mouth daily. ), Disp: 90 tablet, Rfl: 3 .  B Complex Vitamins (B COMPLEX PO), Take 1 tablet by mouth daily.  (Patient not taking: Reported on 04/10/2020), Disp: , Rfl:  .  calcium  carbonate (OS-CAL) 600 MG TABS, Take 600 mg by mouth daily. , Disp: , Rfl:  .  CINNAMON PO, Take 1 tablet by mouth daily. , Disp: , Rfl:  .  diclofenac sodium (VOLTAREN) 1 % GEL, 3 grams to 3 large joints up to 3 times daily, Disp: 3 Tube, Rfl: 3 .  escitalopram (LEXAPRO) 10 MG tablet, Take 0.5 tablets (5 mg total) by mouth daily., Disp: 30 tablet, Rfl: 0 .  estradiol (VIVELLE-DOT) 0.0375 MG/24HR, Place 1 patch onto the skin 2 (two) times a week. (Patient taking differently: Place 1 patch onto the skin 2 (two) times a week. Wednesday and Sunday), Disp: 24 patch, Rfl: 3 .  fish oil-omega-3 fatty acids 1000 MG capsule, Take 2 g by mouth daily., Disp: , Rfl:  .  hydroxychloroquine (PLAQUENIL) 200 MG tablet, Take one tablet by mouth twice daily Monday- Friday only, Disp: 120 tablet, Rfl: 0 .  lubiprostone (AMITIZA) 24 MCG capsule, Take 1 capsule (24 mcg total) by mouth 2 (two) times daily with a meal., Disp: 180 capsule, Rfl: 1 .  Multiple Vitamin (MULTIVITAMIN) tablet, Take 1 tablet by mouth daily. , Disp: , Rfl:  .  NEXIUM 40 MG capsule, Take 40 mg by mouth daily. , Disp: , Rfl:  .  Probiotic Product (PROBIOTIC ADVANCED PO), Take 1 tablet by mouth daily. , Disp: , Rfl:  .  TURMERIC PO, Take 1 tablet by mouth daily. , Disp: , Rfl:   Exam: Current vital signs: BP 94/83 (BP Location: Right Arm)   Pulse (!) 132   Temp 98.2 F (36.8 C) (Oral)   Resp 20   Ht 5\' 4"  (1.626 m)   Wt 59.1 kg   LMP 10/25/2012 (Approximate)   SpO2 93%   BMI 22.36 kg/m  Vital signs in last 24 hours: Temp:  [97.7 F (36.5 C)-98.2 F (36.8 C)] 98.2 F (36.8 C) (10/21 1133) Pulse Rate:  [79-132] 132 (10/21 1133) Resp:  [20] 20 (10/21 1133) BP: (94-167)/(82-83) 94/83 (10/21 1133) SpO2:  [93 %-100 %] 93 % (10/21 1133) Weight:  [59.1 kg] 59.1 kg (10/21 1124)  GENERAL: Awake, alert in NAD HEENT: - Normocephalic and atraumatic, dry mm, no LN++, no Thyromegally LUNGS - Clear to auscultation bilaterally with no  wheezes CV - S1S2 RRR, no m/r/g, equal pulses bilaterally. ABDOMEN - Soft, nontender, nondistended with normoactive BS Ext: warm, well perfused, intact peripheral pulses, no edema  NEURO:  Mental Status: AA&Ox3, slightly anxious appearing Language: speech is nondysarthric.  Naming, repetition, fluency, and comprehension intact. Cranial Nerves: PERRL. EOMI, visual fields full, no facial asymmetry, facial sensation diminished on the left to light touch  in comparison to the right, hearing intact, tongue/uvula/soft palate midline, normal sternocleidomastoid and trapezius muscle strength. No evidence of tongue atrophy or fibrillations Motor: Symmetric 5/5 in all 4 extremities. Tone: is normal and bulk is normal Sensation- Intact to light touch bilaterally with the exception of that on the face as documented above Coordination: FTN intact bilaterally, no ataxia in BLE. Gait- deferred  NIHSS-1 for sensory   Labs I have reviewed labs in epic and the results pertinent to this consultation are:  CBC    Component Value Date/Time   WBC 5.1 04/16/2020 1143   RBC 4.23 04/16/2020 1143   HGB 12.6 04/16/2020 1143   HGB 11.5 05/21/2018 1633   HGB 11.1 (L) 07/07/2011 1527   HCT 40.0 04/16/2020 1143   HCT 34.8 05/21/2018 1633   HCT 34.1 (L) 07/07/2011 1527   PLT 185 04/16/2020 1143   PLT 151 05/21/2018 1633   MCV 94.6 04/16/2020 1143   MCV 90 05/21/2018 1633   MCV 83.8 07/07/2011 1527   MCH 29.8 04/16/2020 1143   MCHC 31.5 04/16/2020 1143   RDW 13.6 04/16/2020 1143   RDW 12.8 05/21/2018 1633   RDW 22.2 (H) 07/07/2011 1527   LYMPHSABS 2.2 04/16/2020 1143   LYMPHSABS 1.8 05/21/2018 1633   LYMPHSABS 1.6 07/07/2011 1527   MONOABS 0.5 04/16/2020 1143   MONOABS 0.3 07/07/2011 1527   EOSABS 0.2 04/16/2020 1143   EOSABS 0.2 05/21/2018 1633   BASOSABS 0.0 04/16/2020 1143   BASOSABS 0.0 05/21/2018 1633   BASOSABS 0.1 07/07/2011 1527    CMP     Component Value Date/Time   NA 141 04/16/2020  1143   NA 141 05/21/2018 1633   K 3.8 04/16/2020 1143   CL 105 04/16/2020 1143   CO2 23 04/16/2020 1143   GLUCOSE 97 04/16/2020 1143   BUN 10 04/16/2020 1143   BUN 12 05/21/2018 1633   CREATININE 0.86 04/16/2020 1143   CREATININE 0.92 03/19/2020 1603   CALCIUM 9.8 04/16/2020 1143   PROT 7.2 04/16/2020 1143   PROT 6.6 05/21/2018 1633   ALBUMIN 4.4 04/16/2020 1143   ALBUMIN 4.5 05/21/2018 1633   AST 34 04/16/2020 1143   ALT 32 04/16/2020 1143   ALKPHOS 72 04/16/2020 1143   BILITOT 0.6 04/16/2020 1143   BILITOT 0.4 05/21/2018 1633   GFRNONAA >60 04/16/2020 1143   GFRNONAA 69 03/19/2020 1603   GFRAA 80 03/19/2020 1603     Imaging I have reviewed the images obtained:  CT-scan of the brain-no acute changes.  Aspects 10.  No bleed  Assessment:  59 year old man with above past medical history presenting with sudden onset of left facial numbness predominantly involving the area behind the ear and the cheek, along with a headache that is been going on for a couple of days. Differentials include complex migraine versus stroke with lower on the differential being a higher cervical lesion since she has a history of multilevel cervical spondylosis but that would not explain the involvement of her anterior face and cheek. I would evaluate further with a stat MRI. Not a candidate for TPA due to low NIH stroke scale and higher likelihood of a complex migraine than stroke. No evidence of LVO on exam hence not a candidate for thrombectomy.  Impression: Strokelike symptoms-evaluate for stroke versus complex migraine.  Recommendations: Stat MRI-positive for stroke, admit for stroke work-up. If MRI is negative, treat with migraine cocktail symptomatically. Follow-up outpatient. Plan discussed with Dr. Ralene Bathe We will follow up on the imaging  --  Amie Portland, MD [facility] Pager: [phone] If 7pm to 7am, please call on call as listed on AMION.   Addendum MRI brain  negative for acute process.  There are nonspecific T2 hyperintensities which carry a broad differential-no inpatient work-up needed for them. She can follow with outpatient neurology Treat symptoms with a migraine cocktail Discussed with Dr. Ralene Bathe. Please call inpatient neurology as needed.  -- Amie Portland, MD [facility] Pager: [phone] If 7pm to 7am, please call on call as listed on AMION.

## 2020-04-16 NOTE — ED Notes (Signed)
Dr. Gilford Raid notified, no order for stroke activation gotten pt to have a ct head wo contrast.

## 2020-04-16 NOTE — ED Triage Notes (Signed)
Patient in with complaints of left sided facial numbness that occurred approximately 30-40 min ago while driving. Patient denies numbness to arm or leg. Patient denies pain. Patient denies starting any new medications.

## 2020-04-16 NOTE — ED Triage Notes (Signed)
Pt complaining of left side facial numbness that started one hour ago, when to UC and was send here for further evaluation, Pt is AO x 4 no neuro deficit noticed.

## 2020-04-16 NOTE — ED Notes (Signed)
Dc instructions reviewed with pt. PT verbalized understanding. Pt DC.  °

## 2020-04-16 NOTE — ED Provider Notes (Signed)
Care of the patient was assumed from Dr. Soyla Dryer at 3 pm; see this physician's note for complete history of present illness, review of systems, and physical exam.   Briefly, the patient is a 59 y.o. female who presented to the ED with left side facial numbness .    - MRI pending for stroke rule out.  History significant for GERD, HLD, HTN, Anemia, Migraines, anxiety.  Plan at time of handoff:  -follow up with neurology for dispo    Additional MDM:    MRI negative for acute changes- per neurology nonspecific T2 hyperintensities which carry a broad differential-no inpatient work-up needed. Neurology recommends migraine cocktail and dc with neurology to arrange for follow up outpt.     The patient was given migraine cocktail for suspected complex migraine. Upon reassessment, pt reports feeling better, is ready to go home.  Pt given neurology follow up instructions. Dc home in stable condition.   Patient seen in conjunction with the attending physician, Vanita Panda, MD, who participated in all aspects of the patient's care and was in agreement with the above plan.   Emergency Department Medication Summary: Medications  lactated ringers bolus 500 mL (has no administration in time range)  ketorolac (TORADOL) 15 MG/ML injection 15 mg (has no administration in time range)  diphenhydrAMINE (BENADRYL) injection 12.5 mg (has no administration in time range)  prochlorperazine (COMPAZINE) injection 10 mg (has no administration in time range)  LORazepam (ATIVAN) injection 2 mg (2 mg Intravenous Given 04/16/20 1400)  gadobutrol (GADAVIST) 1 MMOL/ML injection 6 mL (6 mLs Intravenous Contrast Given 04/16/20 1515)   Clinical Impression: 1. Numbness       Roosevelt Locks, MD 04/16/20 2219    Carmin Muskrat, MD 04/16/20 2312

## 2020-04-16 NOTE — ED Notes (Signed)
Pt returned from MRI °

## 2020-04-16 NOTE — ED Provider Notes (Signed)
[facility] EMERGENCY DEPARTMENT Provider Note   CSN: [id number] Arrival date & time: 04/16/20  1058  An emergency department physician performed an initial assessment on this suspected stroke patient at 1130.  History Chief Complaint  Patient presents with  . Numbness    Presley Gora is a 59 y.o. female.  The history is provided by the patient.  Neurologic Problem This is a new problem. Episode onset: 3hrs ago. The problem occurs constantly. The problem has not changed since onset.Pertinent negatives include no chest pain, no abdominal pain, no headaches and no shortness of breath. Nothing aggravates the symptoms. Nothing relieves the symptoms. She has tried nothing for the symptoms.       Past Medical History:  Diagnosis Date  . Abnormal Pap smear 1988   treated with cryo  . Anemia 11/2010   hematology consult prior; etiology malabsorption and uterine bleeding  . Anxiety   . Gastric polyp   . GERD (gastroesophageal reflux disease)   . H/O bone density study 12/2007  . H/O hysterectomy for benign disease 5/14  . History of echocardiogram 03/2010   normal LV function, EF 60-65%, mild left atrial enlargement  . HTN (hypertension) 03/2010   hospitalization for HTN urgency  . Hyperlipemia   . Internal hemorrhoids   . Lumbar degenerative disc disease   . Migraine   . Myalgia   . Numbness and tingling   . Spondylosis, cervical   . Umbilical hernia   . Uterine fibroid    hx/o     Patient Active Problem List   Diagnosis Date Noted  . History of recent stressful life event 11/20/2019  . Panic attack 11/15/2019  . Fall 11/13/2019  . Pain in both hands 11/13/2019  . Sleep disturbance 11/13/2019  . Grief 11/13/2019  . Bilateral carpal tunnel syndrome 08/21/2019  . Chronic pain of both knees 06/14/2019  . Right arm pain 06/14/2019  . Close exposure to COVID-19 virus 06/14/2019  . Paresthesia 04/08/2019  . Neck pain 03/30/2019  . Radicular  pain in right arm 03/30/2019  . Numbness of leg 03/30/2019  . Chest pain 07/24/2018  . Hypotension 07/17/2018  . Vertigo 07/17/2018  . Nausea 07/17/2018  . Vaccine counseling 05/21/2018  . Need for influenza vaccination 05/21/2018  . Routine general medical examination at a health care facility 05/21/2018  . Caregiver burden 07/05/2017  . Cyst of skin 07/05/2017  . Elevated blood pressure reading 06/28/2017  . Leg pain, diffuse, left 06/28/2017  . Leg swelling 06/28/2017  . Bruising 06/28/2017  . Myofascial muscle pain 06/28/2017  . High risk medication use 03/30/2017  . Chronic constipation 11/17/2016  . Globus sensation 11/17/2016  . Primary osteoarthritis of both knees 10/05/2016  . Autoimmune disease ([place]) 09/29/2016  . Primary osteoarthritis of both hands 09/29/2016  . Family history of lupus erythematosus 09/20/2016  . Polyarthralgia 05/06/2016  . Clenching of teeth 05/06/2016  . Finger swelling 05/06/2016  . Morning stiffness of joints 05/06/2016  . Myalgia 05/06/2016  . Family history of rheumatoid arthritis 05/06/2016  . Essential hypertension 12/23/2014  . History of anemia 12/23/2014  . Care provider for parents 12/23/2014  . Post-operative state 11/13/2012  . Hyperlipidemia 11/11/2012  . Hypertension 11/11/2012  . Impaired fasting glucose 10/25/2011  . Gluten intolerance 10/25/2011  . Gastroesophageal reflux disease without esophagitis 10/25/2011  . Anxiety   . Iron deficiency anemia     Past Surgical History:  Procedure Laterality Date  . ABDOMINAL HYSTERECTOMY    .  CERVICAL FUSION  2010  . CERVICAL SPINE SURGERY    . CERVIX LESION DESTRUCTION  1988  . CESAREAN SECTION    . CHOLECYSTECTOMY  2003  . COLONOSCOPY  08/2010   Dr. Collene Mares  . COLONOSCOPY  02/2017   Dr. Collene Mares  . ESOPHAGOGASTRODUODENOSCOPY  08/2010   Dr. Collene Mares  . EXPLORATORY LAPAROTOMY    . HERNIA REPAIR    . MYOMECTOMY  1998  . PELVIC LAPAROSCOPY    . ROBOTIC ASSISTED LAPAROSCOPIC  HYSTERECTOMY AND SALPINGECTOMY  10/2012   [facility]; laproscopic due to fibroids  . UMBILICAL HERNIA REPAIR  10/2013   infected laparoscopic port, mesh placement     OB History    Gravida  1   Para  1   Term  1   Preterm  0   AB  0   Living  1     SAB  0   TAB  0   Ectopic  0   Multiple  0   Live Births  1           Family History  Problem Relation Age of Onset  . Hypertension Mother   . Dementia Mother   . Colon cancer Mother 25  . Deep vein thrombosis Father   . Hypertension Father   . Cancer Father        prostate CA  . Heart disease Father 14       CAD  . Parkinsonism Father   . Hypertension Sister   . Hyperlipidemia Sister   . Sjogren's syndrome Sister   . Hypertension Brother   . Hyperlipidemia Brother   . Hypertension Maternal Aunt   . Hyperlipidemia Maternal Aunt   . Rheum arthritis Maternal Aunt   . Diabetes Maternal Aunt        1 with type II, 1 with type 1  . Hypertension Brother   . Rheum arthritis Brother   . Diabetes Paternal Aunt        type II  . Breast cancer Neg Hx     Social History   Tobacco Use  . Smoking status: Never Smoker  . Smokeless tobacco: Never Used  Vaping Use  . Vaping Use: Never used  Substance Use Topics  . Alcohol use: No  . Drug use: No    Home Medications Prior to Admission medications   Medication Sig Start Date End Date Taking? Authorizing Provider  acetaminophen (TYLENOL) 500 MG tablet Take 1 tablet (500 mg total) by mouth every 6 (six) hours as needed. 04/01/17   Waynetta Pean, PA-C  ALPRAZolam Duanne Moron) 0.5 MG tablet Take 0.5 mg by mouth 2 (two) times daily as needed. 02/25/20   [provider]  amLODipine (NORVASC) 5 MG tablet Take 1 tablet (5 mg total) by mouth daily. 04/10/20   Tysinger, [name], PA-C  Ascorbic Acid (VITAMIN C) 1000 MG tablet Take 1,000 mg by mouth daily.     [provider]  aspirin EC 81 MG tablet Take 81 mg by mouth daily.    [provider]    atorvastatin (LIPITOR) 20 MG tablet TAKE 1 TABLET BY MOUTH EVERY DAY Patient taking differently: Take 20 mg by mouth daily.  06/14/19   Tysinger, [name], PA-C  B Complex Vitamins (B COMPLEX PO) Take 1 tablet by mouth daily.  Patient not taking: Reported on 04/10/2020    [provider]  calcium carbonate (OS-CAL) 600 MG TABS Take 600 mg by mouth daily.     [provider]  CINNAMON PO Take 1 tablet by mouth daily.     [provider]  diclofenac sodium (VOLTAREN) 1 % GEL 3 grams to 3 large joints up to 3 times daily 09/06/18   [name], MD  escitalopram (LEXAPRO) 10 MG tablet Take 0.5 tablets (5 mg total) by mouth daily. 04/10/20   Tysinger, [name], PA-C  estradiol (VIVELLE-DOT) 0.0375 MG/24HR Place 1 patch onto the skin 2 (two) times a week. Patient taking differently: Place 1 patch onto the skin 2 (two) times a week. Wednesday and Sunday 06/06/19   [name], MD  fish oil-omega-3 fatty acids 1000 MG capsule Take 2 g by mouth daily.    [provider]  hydroxychloroquine (PLAQUENIL) 200 MG tablet Take one tablet by mouth twice daily Monday- Friday only 02/25/20   Ofilia Neas, PA-C  lubiprostone (AMITIZA) 24 MCG capsule Take 1 capsule (24 mcg total) by mouth 2 (two) times daily with a meal. 04/10/20   Tysinger, [name], PA-C  Multiple Vitamin (MULTIVITAMIN) tablet Take 1 tablet by mouth daily.     [provider]  NEXIUM 40 MG capsule Take 40 mg by mouth daily.  03/13/15   [provider]  Probiotic Product (PROBIOTIC ADVANCED PO) Take 1 tablet by mouth daily.     [provider]  TURMERIC PO Take 1 tablet by mouth daily.     [provider]  sertraline (ZOLOFT) 50 MG tablet Take 1 tablet (50 mg total) by mouth daily. 11/15/19 11/19/19  Tysinger, [name], PA-C    Allergies    Quinolones, Latex, Levaquin [levofloxacin], Losartan, Metoprolol, Penicillins, Percocet [oxycodone-acetaminophen], Sulfa drugs  cross reactors, Vicodin [hydrocodone-acetaminophen], and Gluten meal  Review of Systems   Review of Systems  Constitutional: Negative for chills and fever.  HENT: Negative for ear pain and sore throat.   Eyes: Negative for pain and visual disturbance.  Respiratory: Negative for cough and shortness of breath.   Cardiovascular: Negative for chest pain and palpitations.  Gastrointestinal: Negative for abdominal pain and vomiting.  Genitourinary: Negative for dysuria and hematuria.  Musculoskeletal: Negative for arthralgias and back pain.  Skin: Negative for color change and rash.  Neurological: Positive for numbness. Negative for seizures, syncope and headaches.  All other systems reviewed and are negative.   Physical Exam Updated Vital Signs BP 131/81   Pulse 68   Temp 98.2 F (36.8 C) (Oral)   Resp 11   Ht 5\' 4"  (1.626 m)   Wt 59.1 kg   LMP 10/25/2012 (Approximate)   SpO2 100%   BMI 22.36 kg/m   Physical Exam Vitals and nursing note reviewed.  Constitutional:      Appearance: She is well-developed. She is not ill-appearing, toxic-appearing or diaphoretic.  HENT:     Head: Normocephalic and atraumatic.     Mouth/Throat:     Mouth: Mucous membranes are moist.     Pharynx: Oropharynx is clear.  Eyes:     Conjunctiva/sclera: Conjunctivae normal.     Pupils: Pupils are equal, round, and reactive to light.  Cardiovascular:     Rate and Rhythm: Normal rate and regular rhythm.     Heart sounds: No murmur heard.  No gallop.   Pulmonary:     Effort: Pulmonary effort is normal. No respiratory distress.     Breath sounds: Normal breath sounds.  Abdominal:     Palpations: Abdomen is soft.     Tenderness: There is no abdominal tenderness.  Musculoskeletal:  Cervical back: Neck supple.  Skin:    General: Skin is warm and dry.  Neurological:     Mental Status: She is alert.     Comments: Mental status: alert and oriented to person, place, time, situation. Speech: Speech  is clear and language is aphasic Fund of knowledge: Intact  Cranial Nerves:  II: Intact to confrontation bilaterally III, IV, VI: EOMI, no nystagmus V: face sensation decreased on left, good masseter strength VII: no facial droop or weakness VIII: gross hearing intact bilaterally IX/XI: palate elevates symmetrically XII: tongue protrudes symmetrically, no deviation  Strength: 5/5 and symmetric in BUE and BLE. No pronation or drift. Tone: normal tone, no tremors Coordination: Intact finger to nose and heel to shin. Sensation: intact to light touch in all extremities.  Romberg negative.  Gait: Routine gait stable without assistance      ED Results / Procedures / Treatments   Labs (all labs ordered are listed, but only abnormal results are displayed) Labs Reviewed  CBC WITH DIFFERENTIAL/PLATELET  COMPREHENSIVE METABOLIC PANEL    EKG EKG Interpretation  Date/Time:  Thursday April 16 2020 11:36:51 EDT Ventricular Rate:  96 PR Interval:  162 QRS Duration: 94 QT Interval:  378 QTC Calculation: 477 R Axis:   35 Text Interpretation: Normal sinus rhythm Biatrial enlargement Abnormal ECG Confirmed by Quintella Reichert [phone] on 04/16/2020 1:27:02 PM   Radiology MR BRAIN W WO CONTRAST  Result Date: 04/16/2020 CLINICAL DATA:  Neuro deficit, stroke suspected. EXAM: MRI HEAD WITHOUT AND WITH CONTRAST TECHNIQUE: Multiplanar, multiecho pulse sequences of the brain and surrounding structures were obtained without and with intravenous contrast. CONTRAST:  76mL GADAVIST GADOBUTROL 1 MMOL/ML IV SOLN COMPARISON:  04/16/2020 head CT and prior. FINDINGS: Please note that image quality is degraded by motion artifact. Brain: No diffusion-weighted signal abnormality. No intracranial hemorrhage. No midline shift, ventriculomegaly or extra-axial fluid collection. No mass lesion. No abnormal enhancement. Cerebral volume is within normal limits. Scattered T2/FLAIR hyperintense foci involving the  periventricular and subcortical white matter are nonspecific. Vascular: Proximally preserved major intracranial flow voids. Skull and upper cervical spine: Normal marrow signal. Sinuses/Orbits: No acute abnormality.  No mastoid effusion. Other: None. IMPRESSION: 1. No acute intracranial process. 2. Scattered supratentorial white matter FLAIR hyperintensities are nonspecific. Differential includes chronic microvascular ischemic changes, demyelination, vasculitis, migraines and post infectious/inflammatory sequela. Electronically Signed   By: Primitivo Gauze M.D.   On: 04/16/2020 15:32    Procedures Procedures (including critical care time)  Medications Ordered in ED Medications  LORazepam (ATIVAN) injection 2 mg (2 mg Intravenous Given 04/16/20 1400)  gadobutrol (GADAVIST) 1 MMOL/ML injection 6 mL (6 mLs Intravenous Contrast Given 04/16/20 1515)  lactated ringers bolus 500 mL (500 mLs Intravenous New Bag/Given 04/16/20 1727)  ketorolac (TORADOL) 15 MG/ML injection 15 mg (15 mg Intravenous Given 04/16/20 1730)  diphenhydrAMINE (BENADRYL) injection 12.5 mg (12.5 mg Intravenous Given 04/16/20 1728)  prochlorperazine (COMPAZINE) injection 10 mg (10 mg Intravenous Given 04/16/20 1731)    ED Course  I have reviewed the triage vital signs and the nursing notes.  Pertinent labs & imaging results that were available during my care of the patient were reviewed by me and considered in my medical decision making (see chart for details).    MDM Rules/Calculators/A&P                          The patient is a 59yo female, PMH as above who presents to the ED for  L facial decreased sensation.  On my initial evaluation, the patient is  hemodynamically stable, afebrile, nontoxic-appearing. Physical exam remarkable for decreased sensation on L face but otherwise normal neuro exam.  Consulted neurology for evaluation of possible stroke, who recommended MRI. With no objective findings on neuro exam, low  suspicion for stroke but will need to be ruled out. Low suspicion for other etiologies, such as malignancy, aneurysm, ICH, carotid dissection. Screening labs unremarkable. MRI unremarkable, neurology recommending migraine treatment and reassessment.   Transferred care to Dr. Ottis Stain at 1500. Plan of care discussed, including trial of migraine treatment and likely discharge per neurology recommendations. Patient in stable condition at time of transfer.   The care of this patient was overseen by Dr. Ralene Bathe, who agreed with evaluation and plan of care.   Final Clinical Impression(s) / ED Diagnoses Final diagnoses:  Numbness  Left facial numbness    Rx / DC Orders ED Discharge Orders    None       Launa Flight, MD 04/16/20 1734    Quintella Reichert, MD 04/17/20 [phone]

## 2020-04-16 NOTE — ED Notes (Signed)
Patient is being discharged from the Urgent Care and sent to the Emergency Department via POV . Per Dr. Meda Coffee, patient is in need of higher level of care due to sudden onset of numbness to left side of face. Patient is aware and verbalizes understanding of plan of care.  Vitals:   04/16/20 1039  BP: (!) 167/82  Pulse: 79  Resp: 20  Temp: 97.7 F (36.5 C)  SpO2: 100%

## 2020-04-16 NOTE — Code Documentation (Signed)
Stroke Response Nurse Documentation Code Documentation  Shari Lawrence is a 59 y.o. female arriving to [facility] ED via Private Vehicle on 10/21with past medical hx of anxiety, migraines, Hyperlipidemia, and HTN. Code stroke was activated by ED. Patient from Urgent Care where she was LKW at 0900 and now complaining of left facial numbness. Pt takes amlodipine and statin.   Code Stroke called after seen by EDP Ralene Bathe. Stroke team at the bedside in CT. Labs drawn and patient cleared for CT by Dr. Ralene Bathe. Patient to CT with team. NIHSS 1 see documentation for details and code stroke times. Patient with left decreased sensation of the face on exam. The following imaging was completed: CT. Patient is not a candidate for tPA due to being too mild to treat. Outside window at 1330. Care/Plan: Complete MRI, Q2 mNIHSS/VS. Bedside handoff with ED RN Josh.    Kathrin Greathouse  Stroke Response RN

## 2020-04-16 NOTE — ED Notes (Signed)
Patient transported to MRI 

## 2020-04-17 LAB — HEMOGLOBIN A1C
Est. average glucose Bld gHb Est-mCnc: 117 mg/dL
Hgb A1c MFr Bld: 5.7 % — ABNORMAL HIGH (ref 4.8–5.6)

## 2020-04-17 LAB — LIPID PANEL
Chol/HDL Ratio: 2 ratio (ref 0.0–4.4)
Cholesterol, Total: 173 mg/dL (ref 100–199)
HDL: 86 mg/dL (ref >39)
LDL Chol Calc (NIH): 77 mg/dL (ref 0–99)
Triglycerides: 51 mg/dL (ref 0–149)
VLDL Cholesterol Cal: 10 mg/dL (ref 5–40)

## 2020-05-04 ENCOUNTER — Other Ambulatory Visit: Payer: Self-pay

## 2020-05-04 ENCOUNTER — Encounter: Payer: Self-pay | Admitting: Medical

## 2020-05-04 ENCOUNTER — Ambulatory Visit: Payer: BC Managed Care – PPO | Admitting: Medical

## 2020-05-04 VITALS — BP 142/90 | HR 56 | Ht 64.0 in | Wt 131.4 lb

## 2020-05-04 DIAGNOSIS — M25469 Effusion, unspecified knee: Secondary | ICD-10-CM

## 2020-05-04 DIAGNOSIS — R202 Paresthesia of skin: Secondary | ICD-10-CM

## 2020-05-04 DIAGNOSIS — H9202 Otalgia, left ear: Secondary | ICD-10-CM | POA: Diagnosis not present

## 2020-05-04 DIAGNOSIS — R519 Headache, unspecified: Secondary | ICD-10-CM | POA: Insufficient documentation

## 2020-05-04 DIAGNOSIS — R200 Anesthesia of skin: Secondary | ICD-10-CM | POA: Insufficient documentation

## 2020-05-04 DIAGNOSIS — G501 Atypical facial pain: Secondary | ICD-10-CM

## 2020-05-04 MED ORDER — TIZANIDINE HCL 4 MG PO TABS: 4.0000 mg | ORAL_TABLET | Freq: Every evening | ORAL | 0 refills | Status: DC | PRN

## 2020-05-04 NOTE — [facility] Notes (Signed)
Subjective:  Shari Lawrence is a 59 y.o. female who presents for Chief Complaint  Patient presents with  . Ear Pain    left ear- sensitive to touch      Here for follow-up from recent emergency department visit.  She was seen in urgent care and 71 sent to the emergency department on October 21 for symptoms of numbness and tingling in the left face along with pain in her left ear.  She ended up having a scan and some lab work that did not reveal a stroke.  She was advised to have a consult with neurology  She notes for the last 2 weeks she has had some pains in her left ear area.  They are sharp stabbing pains that are brief but they are mainly around the ear and not across the face.  She denies hearing change or hearing loss.  No ear pressure or sinus pressure, no respiratory symptoms such as sore throat, congestion, cough, runny nose.  The left facial numbness has improved since last week.  But she does still get some decreased sensation of the face.  She also has intermittent numbness in the top portion of her left lower leg.  She attributes this sometimes to the swelling from her arthritis.  No recent fall, no confusion, no slurred speech, no ataxia.  She has been advised before from her dentist that she tends to clench her jaw and her sleep possibly due to tension or anxiety.  She does not chew a lot of gum or has not recently had real tough meat that she was eating.  She has chronic pains in her back, sees massage therapy currently 2 times a month and sees chiropractor some.  They have advised her of positive muscle spasms and knots in her back.  She has seen neurology in the past for right arm numbness that was diagnosed as carpal tunnel which resolved after she stopped taking care of her father when he passed away.  She was doing a lot of transferring and moving him around in her house taking care of him.  She continues to see Dr. Estanislado Pandy for autoimmune disease, and she sees  orthopedics as well for history of osteoarthritis and bursitis as well as myofascial muscle pain.  No other aggravating or relieving factors.    No other c/o.  Past Medical History:  Diagnosis Date  . Abnormal Pap smear 1988   treated with cryo  . Anemia 11/2010   hematology consult prior; etiology malabsorption and uterine bleeding  . Anxiety   . Gastric polyp   . GERD (gastroesophageal reflux disease)   . H/O bone density study 12/2007  . H/O hysterectomy for benign disease 5/14  . History of echocardiogram 03/2010   normal LV function, EF 60-65%, mild left atrial enlargement  . HTN (hypertension) 03/2010   hospitalization for HTN urgency  . Hyperlipemia   . Internal hemorrhoids   . Lumbar degenerative disc disease   . Migraine   . Myalgia   . Numbness and tingling   . Spondylosis, cervical   . Umbilical hernia   . Uterine fibroid    hx/o      The following portions of the patient's history were reviewed and updated as appropriate: allergies, current medications, past family history, past medical history, past social history, past surgical history and problem list.  ROS Otherwise as in subjective above    Objective: BP (!) 142/90   Pulse (!) 56   Ht 5'  4" (1.626 m)   Wt 131 lb 6.4 oz (59.6 kg)   LMP 10/25/2012 (Approximate)   SpO2 99%   BMI 22.55 kg/m   General appearance: alert, no distress, well developed, well nourished Mild tenderness over the left preauricular/TMJ region otherwise nontender to the touch, no abnormality of the ear HEENT: normocephalic, sclerae anicteric, conjunctiva pink and moist, TMs pearly, nares patent, no discharge or erythema, pharynx normal Oral cavity: MMM, no lesions Neck: supple, no lymphadenopathy, no thyromegaly, no masses, nontender, normal range of motion Neuro: CN II through XII intact, nonfocal exam, no cerebellar changes, normal DTRs and strength and sensation  pulses: 2+ radial pulses, 2+ pedal pulses, normal cap  refill Ext: no edema   MRI brain 04/16/2020  IMPRESSION: 1. No acute intracranial process. 2. Scattered supratentorial white matter FLAIR hyperintensities are nonspecific. Differential includes chronic microvascular ischemic changes, demyelination, vasculitis, migraines and post infectious/inflammatory sequela.     Assessment: Encounter Diagnoses  Name Primary?  . Facial numbness Yes  . Leg numbness   . Knee swelling   . Facial pain   . Ear pain, left      Plan: I reviewed her recent emergency department visit, reviewed the labs she had for comprehensive metabolic and CBC on October 21 that was unremarkable.  Reviewed MRI brain 04/16/20, impression noted above  Discussed symptoms, possible causes and differential.   discussed possibility of TMJ, trigeminal neuralgia, other in regards to ear pain/facial pain.   Advised no ear infection, no rash, no shingles, no other obvious cause of ear pain.  Begin trial of tizanidine QHS the next few nights, jaw rest, avoid heavy chewing, limit talking, consider cool therapy, can use Tylenol the next several days.      Referral to neurology at her request given recent facial paresthesias, ongoing intermittent left leg numbness.   She will take a B vitamin complex and continue multivitamin.   Discussed recent labs, MRI.    Jewelle was seen today for ear pain.  Diagnoses and all orders for this visit:  Facial numbness -     Ambulatory referral to Neurology  Leg numbness -     Ambulatory referral to Neurology  Knee swelling -     Ambulatory referral to Neurology  Facial pain -     Ambulatory referral to Neurology  Ear pain, left -     Ambulatory referral to Neurology  Other orders -     tiZANidine (ZANAFLEX) 4 MG tablet; Take 1 tablet (4 mg total) by mouth at bedtime as needed for muscle spasms.   Follow up: pending referral

## 2020-05-05 NOTE — [facility] Notes (Signed)
Patient has been referred to [facility] Neurology

## 2020-05-12 ENCOUNTER — Telehealth: Payer: Self-pay

## 2020-05-12 ENCOUNTER — Other Ambulatory Visit: Payer: Self-pay | Admitting: Medical

## 2020-05-12 NOTE — Telephone Encounter (Signed)
Patient not able to tel what dose she was previously taking. Please send new script.

## 2020-05-12 NOTE — Telephone Encounter (Signed)
Patient stated she has gabapentin now. Pharmacy told her to stop the muscle relaxer because it gave her a bad mouth and dry mouth.

## 2020-05-12 NOTE — Telephone Encounter (Signed)
Patient stated she is having constant pain un her ears now. 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 Please advise next step.

## 2020-05-12 NOTE — Telephone Encounter (Signed)
It is not listed in the current med list.  Please verify the dose she is taking and update the chart accordingly.  I may increase the dose based on what you tell me.  Try to get into neurology sooner

## 2020-05-12 NOTE — Telephone Encounter (Signed)
We discussed possible causes last visit.  At this point we should have already referred to neurology and awaiting an appointment  Apparently the tizanidine muscle relaxer did not help  I can begin her on a trial of gabapentin or nortriptyline which may potentially help.  If agreeable I will send one of these neuropathic pain medications as a trial

## 2020-05-13 ENCOUNTER — Other Ambulatory Visit: Payer: Self-pay | Admitting: Physician Assistant

## 2020-05-13 ENCOUNTER — Other Ambulatory Visit: Payer: Self-pay | Admitting: Medical

## 2020-05-13 ENCOUNTER — Other Ambulatory Visit: Payer: Self-pay | Admitting: Obstetrics and Gynecology

## 2020-05-13 DIAGNOSIS — M359 Systemic involvement of connective tissue, unspecified: Secondary | ICD-10-CM

## 2020-05-13 MED ORDER — GABAPENTIN 100 MG PO CAPS: ORAL_CAPSULE | ORAL | 1 refills | Status: DC

## 2020-05-13 NOTE — Telephone Encounter (Signed)
Medication refill request: Estradiol Last AEX:  06/06/19 Dr. Talbert Nan Next AEX: 06/10/20 Last MMG (if hormonal medication request): 09/19/19 BIRADS 1 negative/density c Refill authorized: today, please advise

## 2020-05-13 NOTE — Telephone Encounter (Signed)
Last Visit: 03/19/2020 Next Visit: 08/19/2020 Labs: 04/16/2020 WNL Eye exam: 06/04/2019 WNL  Current Dose per office note 03/19/2020: PLQ 200 mg 1 tablet by mouth daily twice daily M-F only DX: Autoimmune disease   Okay to refill per Dr. Estanislado Pandy

## 2020-05-13 NOTE — Telephone Encounter (Signed)
I sent Gabapentin Begin 1 capsule at bedtime x 1 week,  Then go to 2 capsules at bedtime x 1 week, Then after the 2nd week, change to 1 capsule morning, 2 capsules at night.  Continue plan for follow up with neurology

## 2020-06-02 NOTE — Progress Notes (Signed)
Office Visit Note  Patient: Shari Lawrence             Date of Birth: 1961/03/04           MRN: [id number]             PCP: Carlena Hurl, PA-C Referring: Carlena Hurl, PA-C Visit Date: 06/16/2020 Occupation: @GUAROCC @  Subjective:  Other (Patient reports dry mouth, eye dryness, bilateral hand pain and bilateral elbow pain )   History of Present Illness: Shari Lawrence is a 59 y.o. female with history of autoimmune disease.  She states she has been trouble with dry mouth and dry eyes lately.  She has been using over-the-counter products.  She also has been experiencing increased pain in her both elbows and her both knees.  Notices swelling in her knee joints.  She states she has been also bothered by constipation.  She has been followed by gastroenterologist.  Activities of Daily Living:  Patient reports morning stiffness for several  hours.   Patient Reports nocturnal pain.  Difficulty dressing/grooming: Denies Difficulty climbing stairs: Reports Difficulty getting out of chair: Reports Difficulty using hands for taps, buttons, cutlery, and/or writing: Reports  Review of Systems  Constitutional: Positive for fatigue.  HENT: Positive for mouth dryness. Negative for mouth sores and nose dryness.   Eyes: Positive for pain and dryness. Negative for itching.  Respiratory: Negative for shortness of breath and difficulty breathing.   Cardiovascular: Negative for chest pain and palpitations.  Gastrointestinal: Positive for constipation. Negative for blood in stool and diarrhea.  Endocrine: Negative for increased urination.  Genitourinary: Negative for difficulty urinating.  Musculoskeletal: Positive for arthralgias, joint pain, joint swelling, myalgias, morning stiffness, muscle tenderness and myalgias. Negative for muscle weakness.  Skin: Negative for color change, rash and redness.  Allergic/Immunologic: Negative for susceptible to infections.  Neurological:  Positive for headaches. Negative for dizziness, numbness, memory loss and weakness.  Hematological: Positive for bruising/bleeding tendency.  Psychiatric/Behavioral: Negative for confusion. The patient is nervous/anxious.     PMFS History:  Patient Active Problem List   Diagnosis Date Noted  . Prediabetes   . Facial numbness 05/04/2020  . Leg numbness 05/04/2020  . Knee swelling 05/04/2020  . Facial pain 05/04/2020  . Ear pain, left 05/04/2020  . History of recent stressful life event 11/20/2019  . Panic attack 11/15/2019  . Fall 11/13/2019  . Pain in both hands 11/13/2019  . Sleep disturbance 11/13/2019  . Grief 11/13/2019  . Bilateral carpal tunnel syndrome 08/21/2019  . Chronic pain of both knees 06/14/2019  . Right arm pain 06/14/2019  . Close exposure to COVID-19 virus 06/14/2019  . Paresthesia 04/08/2019  . Neck pain 03/30/2019  . Radicular pain in right arm 03/30/2019  . Numbness of leg 03/30/2019  . Chest pain 07/24/2018  . Hypotension 07/17/2018  . Vertigo 07/17/2018  . Nausea 07/17/2018  . Vaccine counseling 05/21/2018  . Need for influenza vaccination 05/21/2018  . Routine general medical examination at a health care facility 05/21/2018  . Caregiver burden 07/05/2017  . Cyst of skin 07/05/2017  . Elevated blood pressure reading 06/28/2017  . Leg pain, diffuse, left 06/28/2017  . Leg swelling 06/28/2017  . Bruising 06/28/2017  . Myofascial muscle pain 06/28/2017  . High risk medication use 03/30/2017  . Chronic constipation 11/17/2016  . Globus sensation 11/17/2016  . Primary osteoarthritis of both knees 10/05/2016  . Autoimmune disease ([place]) 09/29/2016  . Primary osteoarthritis of both hands 09/29/2016  .  Family history of lupus erythematosus 09/20/2016  . Polyarthralgia 05/06/2016  . Clenching of teeth 05/06/2016  . Finger swelling 05/06/2016  . Morning stiffness of joints 05/06/2016  . Myalgia 05/06/2016  . Family history of rheumatoid arthritis  05/06/2016  . Essential hypertension 12/23/2014  . History of anemia 12/23/2014  . Care provider for parents 12/23/2014  . Post-operative state 11/13/2012  . Hyperlipidemia 11/11/2012  . Hypertension 11/11/2012  . Impaired fasting glucose 10/25/2011  . Gluten intolerance 10/25/2011  . Gastroesophageal reflux disease without esophagitis 10/25/2011  . Anxiety   . Iron deficiency anemia     Past Medical History:  Diagnosis Date  . Abnormal Pap smear 1988   treated with cryo  . Anemia 11/2010   hematology consult prior; etiology malabsorption and uterine bleeding  . Anxiety   . Gastric polyp   . GERD (gastroesophageal reflux disease)   . H/O bone density study 12/2007  . H/O hysterectomy for benign disease 5/14  . History of echocardiogram 03/2010   normal LV function, EF 60-65%, mild left atrial enlargement  . HTN (hypertension) 03/2010   hospitalization for HTN urgency  . Hyperlipemia   . Internal hemorrhoids   . Lumbar degenerative disc disease   . Migraine   . Myalgia   . Numbness and tingling   . Prediabetes   . Spondylosis, cervical   . Umbilical hernia   . Uterine fibroid    hx/o     Family History  Problem Relation Age of Onset  . Hypertension Mother   . Dementia Mother   . Colon cancer Mother 71  . Deep vein thrombosis Father   . Hypertension Father   . Cancer Father        prostate CA  . Heart disease Father 57       CAD  . Parkinsonism Father   . Hypertension Sister   . Hyperlipidemia Sister   . Sjogren's syndrome Sister   . Hypertension Brother   . Hyperlipidemia Brother   . Hypertension Maternal Aunt   . Hyperlipidemia Maternal Aunt   . Rheum arthritis Maternal Aunt   . Diabetes Maternal Aunt        1 with type II, 1 with type 1  . Hypertension Brother   . Rheum arthritis Brother   . Diabetes Paternal Aunt        type II  . Breast cancer Neg Hx    Past Surgical History:  Procedure Laterality Date  . ABDOMINAL HYSTERECTOMY    . CERVICAL  FUSION  2010  . CERVICAL SPINE SURGERY    . CERVIX LESION DESTRUCTION  1988  . CESAREAN SECTION    . CHOLECYSTECTOMY  2003  . COLONOSCOPY  08/2010   Dr. Collene Mares  . COLONOSCOPY  02/2017   Dr. Collene Mares  . ESOPHAGOGASTRODUODENOSCOPY  08/2010   Dr. Collene Mares  . EXPLORATORY LAPAROTOMY    . HERNIA REPAIR    . MYOMECTOMY  1998  . PELVIC LAPAROSCOPY    . ROBOTIC ASSISTED LAPAROSCOPIC HYSTERECTOMY AND SALPINGECTOMY  10/2012   [facility]; laproscopic due to fibroids  . 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  Has 109 yo daughter.  Her siblings and her don't  agree on helping take care of their parents, causes family tension.  As of 11/2014      Right-handed.   0.5 cup caffeine per day.   Her father lives with her.      Update 09/2019--father passed away from urosepsis in 2019-10-13   Immunization History  Administered Date(s) Administered  . Influenza,inj,Quad PF,6+ Mos 05/01/2013, 03/31/2014, 05/17/2017, 05/21/2018, 03/29/2019, 04/10/2020  . PFIZER SARS-COV-2 Vaccination 08/24/2019, 09/17/2019, 03/30/2020  . Td 02/04/2012     Objective: Vital Signs: BP (!) 150/91 (BP Location: Left Arm, Patient Position: Sitting, Cuff Size: Normal)   Pulse (!) 55   Resp 13   Ht 5\' 4"  (1.626 m)   Wt 126 lb 9.6 oz (57.4 kg)   LMP 10/25/2012 (Approximate)   BMI 21.73 kg/m    Physical Exam Vitals and nursing note reviewed.  Constitutional:      Appearance: She is well-developed and well-nourished.  HENT:     Head: Normocephalic and atraumatic.  Eyes:     Extraocular Movements: EOM normal.     Conjunctiva/sclera: Conjunctivae normal.  Cardiovascular:     Rate and Rhythm: Normal rate and regular  rhythm.     Pulses: Intact distal pulses.     Heart sounds: Normal heart sounds.  Pulmonary:     Effort: Pulmonary effort is normal.     Breath sounds: Normal breath sounds.  Abdominal:     General: Bowel sounds are normal.     Palpations: Abdomen is soft.  Musculoskeletal:     Cervical back: Normal range of motion.  Lymphadenopathy:     Cervical: No cervical adenopathy.  Skin:    General: Skin is warm and dry.     Capillary Refill: Capillary refill takes less than 2 seconds.  Neurological:     Mental Status: She is alert and oriented to person, place, and time.  Psychiatric:        Mood and Affect: Mood and affect normal.        Behavior: Behavior normal.      Musculoskeletal Exam: C-spine, thoracic and lumbar spine with good range of motion.  Shoulder joints, elbow joints, wrist joints, MCPs PIPs and DIPs with good range of motion with no synovitis.  Hip joints, knee joints, ankles, MTPs and PIPs with good range of motion with no synovitis.  CDAI Exam: CDAI Score: -- Patient Global: --; Provider Global: -- Swollen: --; Tender: -- Joint Exam 06/16/2020   No joint exam has been documented for this visit   There is currently no information documented on the homunculus. Go to the Rheumatology activity and complete the homunculus joint exam.  Investigation: No additional findings.  Imaging: No results found.  Recent Labs: Lab Results  Component Value Date   WBC 5.1 04/16/2020   HGB 12.6 04/16/2020   PLT 185 04/16/2020   NA 141 04/16/2020   K 3.8 04/16/2020   CL 105 04/16/2020   CO2 23 04/16/2020   GLUCOSE 97 04/16/2020   BUN 10 04/16/2020   CREATININE 0.86 04/16/2020   BILITOT 0.6 04/16/2020   ALKPHOS 72 04/16/2020   AST 34 04/16/2020   ALT 32 04/16/2020   PROT 7.2 04/16/2020   ALBUMIN 4.4 04/16/2020   CALCIUM 9.8 04/16/2020   GFRAA 80 03/19/2020    Speciality Comments: PLQ Eye Exam: 06/03/2020 WNL @ [facility]. Follow up in 1 year.  Procedures:   No procedures performed Allergies: Quinolones, Garlic, Latex, Levaquin [levofloxacin], Losartan, Metoprolol, Onion, Penicillins, Percocet [oxycodone-acetaminophen], Sulfa drugs  cross reactors, Vicodin [hydrocodone-acetaminophen], and Gluten meal   Assessment / Plan:     Visit Diagnoses: Autoimmune disease ([place]) - ANA 1:320 NO, ds8, RF-,CCP-,History of nasal ulcers and oral ulcers in the past, positive synovitis in left hand MCPs on [place] previously: -Patient states that recently she has been experiencing sicca symptoms although they have improved over the last few days.  All autoimmune work-up so far has been negative.  She had no synovitis on examination.  We will check labs prior to her next appointment.  Have advised her to contact me if she develops increased symptoms.  Plan: Urinalysis, Routine w reflex microscopic, Anti-DNA antibody, double-stranded, C3 and C4, Sedimentation rate  Sicca complex (HCC)-over-the-counter products were discussed.  High risk medication use - PLQ 200 mg 1 tablet by mouth daily twice daily M-F only.PLQ Eye Exam: 06/03/2020 - Plan: CBC with Differential/Platelet, COMPLETE METABOLIC PANEL WITH GFR in March.  Primary osteoarthritis of both hands-joint protection was discussed.  Pain of both elbows-she had no synovitis on exam my examination.  She probably has some tendinitis.  Primary osteoarthritis of both knees-she complains of swelling in her knee joints.  No warmth swelling or effusion was noted.  Myofascial muscle pain-she continues to have some generalized pain and discomfort.  Trochanteric bursitis of both hips-ITB and stretches were discussed.  Family history of lupus erythematosus  Family history of rheumatoid arthritis  History of anxiety  History of gastroesophageal reflux (GERD)  History of hyperlipidemia  History of anemia  History of hypertension  Osteopenia of multiple sites - 09/05/19 The BMD measured at Femur Neck Left is 0.868 g/cm2 with  a T-score of-1.2.  -Use of resistive exercises were discussed.  Plan: VITAMIN D 25 Hydroxy (Vit-D Deficiency, Fractures)  Vitamin D deficiency -she gives history of vitamin D deficiency and has been taking vitamin D supplement.  We will check vitamin D level with her next labs.  Plan: VITAMIN D 25 Hydroxy (Vit-D Deficiency, Fractures)  Orders: Orders Placed This Encounter  Procedures  . CBC with Differential/Platelet  . COMPLETE METABOLIC PANEL WITH GFR  . Urinalysis, Routine w reflex microscopic  . Anti-DNA antibody, double-stranded  . C3 and C4  . Sedimentation rate  . VITAMIN D 25 Hydroxy (Vit-D Deficiency, Fractures)   No orders of the defined types were placed in this encounter.   Follow-Up Instructions: Return in about 5 months (around 11/14/2020) for Autoimmune disease.   [name], MD  Note - This record has been created using Editor, commissioning.  Chart creation errors have been sought, but may not always  have been located. Such creation errors do not reflect on  the standard of medical care.

## 2020-06-09 NOTE — Progress Notes (Signed)
59 y.o. G1P1001 Divorced Black or [place] American Not Hispanic or Latino female here for annual exam. H/O hysterectomy, on 0.0375 mg estradiol patch. She is doing okay with the patch, willing to try go down. She has some 0.025 mg patches at home. She will try them and let me know how she is doing.   She has an autoimmune disorder and chronic pain. Pain is bearable, rest helps.     She struggles with severe constipation, on medication from GI. Having a BM every 2 weeks.   Patient's last menstrual period was 10/25/2012 (approximate).          Sexually active: No.  The current method of family planning is status post hysterectomy.    Exercising: No.  The patient does not participate in regular exercise at present. Smoker:  no  Health Maintenance: Pap: 04/20/2017 WNL NEG HR HPV  History of abnormal Pap:  cyrosurgery of her cervix in her 20's MMG:  09/19/19 density C Bi-rads 1 neg  BMD:   09/05/19 osteopenic, T score -1.2, Frax not done Colonoscopy: Normal colonoscopy in 8/17. Doing them every 5 years. TDaP:  02/04/12 Gardasil: none    reports that she has never smoked. She has never used smokeless tobacco. She reports that she does not drink alcohol and does not use drugs. 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 Daughter is a Education officer, museum, lives locally.    Past Medical History:  Diagnosis Date  . Abnormal Pap smear 1988   treated with cryo  . Anemia 11/2010   hematology consult prior; etiology malabsorption and uterine bleeding  . Anxiety   . Gastric polyp   . GERD (gastroesophageal reflux disease)   . H/O bone density study 12/2007  . H/O hysterectomy for benign disease 5/14  . History of echocardiogram 03/2010   normal LV function, EF 60-65%, mild left atrial enlargement  . HTN (hypertension) 03/2010   hospitalization for HTN urgency  . Hyperlipemia   . Internal hemorrhoids   . Lumbar degenerative disc disease   . Migraine   . Myalgia   . Numbness and tingling   . Prediabetes   .  Spondylosis, cervical   . Umbilical hernia   . Uterine fibroid    hx/o     Past Surgical History:  Procedure Laterality Date  . ABDOMINAL HYSTERECTOMY    . CERVICAL FUSION  2010  . CERVICAL SPINE SURGERY    . CERVIX LESION DESTRUCTION  1988  . CESAREAN SECTION    . CHOLECYSTECTOMY  2003  . COLONOSCOPY  08/2010   Dr. Collene Mares  . COLONOSCOPY  02/2017   Dr. Collene Mares  . ESOPHAGOGASTRODUODENOSCOPY  08/2010   Dr. Collene Mares  . EXPLORATORY LAPAROTOMY    . HERNIA REPAIR    . MYOMECTOMY  1998  . PELVIC LAPAROSCOPY    . ROBOTIC ASSISTED LAPAROSCOPIC HYSTERECTOMY AND SALPINGECTOMY  10/2012   [facility]; laproscopic due to fibroids  . UMBILICAL HERNIA REPAIR  10/2013   infected laparoscopic port, mesh placement    Current Outpatient Medications  Medication Sig Dispense Refill  . acetaminophen (TYLENOL) 500 MG tablet Take 1 tablet (500 mg total) by mouth every 6 (six) hours as needed. 30 tablet 0  . ALPRAZolam (XANAX) 0.5 MG tablet Take 0.5 mg by mouth 2 (two) times daily as needed.    Marland Kitchen amLODipine (NORVASC) 5 MG tablet Take 1 tablet (5 mg total) by mouth daily. 90 tablet 3  . Ascorbic Acid (VITAMIN C) 1000 MG tablet Take 1,000 mg by  mouth daily.    Marland Kitchen aspirin EC 81 MG tablet Take 81 mg by mouth daily.    Marland Kitchen atorvastatin (LIPITOR) 20 MG tablet TAKE 1 TABLET BY MOUTH EVERY DAY (Patient taking differently: Take 20 mg by mouth daily.) 90 tablet 3  . B Complex Vitamins (B COMPLEX PO) Take 1 tablet by mouth daily.    . calcium carbonate (OS-CAL) 600 MG TABS Take 600 mg by mouth daily.    Marland Kitchen CINNAMON PO Take 1 tablet by mouth daily.     . diclofenac sodium (VOLTAREN) 1 % GEL 3 grams to 3 large joints up to 3 times daily 3 Tube 3  . escitalopram (LEXAPRO) 10 MG tablet Take 0.5 tablets (5 mg total) by mouth daily. 30 tablet 0  . estradiol (VIVELLE-DOT) 0.0375 MG/24HR PLACE 1 PATCH ONTO THE SKIN 2 (TWO) TIMES A WEEK. 24 patch 0  . fish oil-omega-3 fatty acids 1000 MG capsule Take 2 g by mouth daily.    .  hydroxychloroquine (PLAQUENIL) 200 MG tablet TAKE ONE TABLET BY MOUTH TWICE DAILY MONDAY- FRIDAY ONLY 120 tablet 0  . lubiprostone (AMITIZA) 24 MCG capsule Take 1 capsule (24 mcg total) by mouth 2 (two) times daily with a meal. 180 capsule 1  . Multiple Vitamin (MULTIVITAMIN) tablet Take 1 tablet by mouth daily.    Marland Kitchen NEXIUM 40 MG capsule Take 40 mg by mouth daily.     . Probiotic Product (PROBIOTIC ADVANCED PO) Take 1 tablet by mouth daily.     . TURMERIC PO Take 1 tablet by mouth daily.      No current facility-administered medications for this visit.    Family History  Problem Relation Age of Onset  . Hypertension Mother   . Dementia Mother   . Colon cancer Mother 5  . Deep vein thrombosis Father   . Hypertension Father   . Cancer Father        prostate CA  . Heart disease Father 77       CAD  . Parkinsonism Father   . Hypertension Sister   . Hyperlipidemia Sister   . Sjogren's syndrome Sister   . Hypertension Brother   . Hyperlipidemia Brother   . Hypertension Maternal Aunt   . Hyperlipidemia Maternal Aunt   . Rheum arthritis Maternal Aunt   . Diabetes Maternal Aunt        1 with type II, 1 with type 1  . Hypertension Brother   . Rheum arthritis Brother   . Diabetes Paternal Aunt        type II  . Breast cancer Neg Hx     Review of Systems  All other systems reviewed and are negative.   Exam:   BP 122/64   Pulse 62   Ht 5\' 4"  (1.626 m)   Wt 123 lb 6.4 oz (56 kg)   LMP 10/25/2012 (Approximate)   SpO2 99%   BMI 21.18 kg/m   Weight change: @WEIGHTCHANGE @ Height:   Height: 5\' 4"  (162.6 cm)  Ht Readings from Last 3 Encounters:  06/10/20 5\' 4"  (1.626 m)  05/04/20 5\' 4"  (1.626 m)  04/16/20 5\' 4"  (1.626 m)    General appearance: alert, cooperative and appears stated age Head: Normocephalic, without obvious abnormality, atraumatic Neck: no adenopathy, supple, symmetrical, trachea midline and thyroid normal to inspection and palpation Lungs: clear to  auscultation bilaterally Cardiovascular: regular rate and rhythm Breasts: normal appearance, no masses or tenderness Abdomen: soft, non-tender; non distended,  no masses,  no  organomegaly Extremities: extremities normal, atraumatic, no cyanosis or edema Skin: Skin color, texture, turgor normal. No rashes or lesions Lymph nodes: Cervical, supraclavicular, and axillary nodes normal. No abnormal inguinal nodes palpated Neurologic: Grossly normal   Pelvic: External genitalia:  no lesions              Urethra:  normal appearing urethra with no masses, tenderness or lesions              Bartholins and Skenes: normal                 Vagina: normal appearing vagina with normal color and discharge, no lesions              Cervix: absent               Bimanual Exam:  Uterus:  uterus absent              Adnexa: no mass, fullness, tenderness               Rectovaginal: Confirms               Anus:  normal sphincter tone, no lesions  Gae Dry chaperoned for the exam.  A:  Well Woman with normal exam, h/o hysterectomy  On ERT, will try going on the lower dose of ERT. She will call when she needs a refill and let me know if she will continue on the 0.025 or go back to the 0.0375 mg  Prediabetic, managed by primary  Chronic pain  Autoimmune issues, Rheumatologist is aware of her being on ERT  P:   No pap this year  Mammogram in the spring  Next DEXA due in 3/26  Colonoscopy due in 8/22  Labs with primary and rheumatology  Discussed breast self exam  Discussed calcium and vit D intake

## 2020-06-10 ENCOUNTER — Encounter: Payer: Self-pay | Admitting: Obstetrics and Gynecology

## 2020-06-10 ENCOUNTER — Ambulatory Visit: Payer: BC Managed Care – PPO | Admitting: Obstetrics and Gynecology

## 2020-06-10 ENCOUNTER — Other Ambulatory Visit: Payer: Self-pay

## 2020-06-10 DIAGNOSIS — R7303 Prediabetes: Secondary | ICD-10-CM | POA: Diagnosis not present

## 2020-06-10 NOTE — Patient Instructions (Signed)

## 2020-06-16 ENCOUNTER — Encounter: Payer: Self-pay | Admitting: Rheumatology

## 2020-06-16 ENCOUNTER — Other Ambulatory Visit: Payer: Self-pay

## 2020-06-16 ENCOUNTER — Ambulatory Visit (INDEPENDENT_AMBULATORY_CARE_PROVIDER_SITE_OTHER): Payer: BC Managed Care – PPO | Admitting: Rheumatology

## 2020-06-16 VITALS — BP 150/91 | HR 55 | Resp 13 | Ht 64.0 in | Wt 126.6 lb

## 2020-06-16 DIAGNOSIS — M8589 Other specified disorders of bone density and structure, multiple sites: Secondary | ICD-10-CM

## 2020-06-16 DIAGNOSIS — M3500 Sicca syndrome, unspecified: Secondary | ICD-10-CM | POA: Diagnosis not present

## 2020-06-16 DIAGNOSIS — M19042 Primary osteoarthritis, left hand: Secondary | ICD-10-CM

## 2020-06-16 DIAGNOSIS — Z8679 Personal history of other diseases of the circulatory system: Secondary | ICD-10-CM

## 2020-06-16 DIAGNOSIS — Z8659 Personal history of other mental and behavioral disorders: Secondary | ICD-10-CM

## 2020-06-16 DIAGNOSIS — Z8639 Personal history of other endocrine, nutritional and metabolic disease: Secondary | ICD-10-CM

## 2020-06-16 DIAGNOSIS — Z862 Personal history of diseases of the blood and blood-forming organs and certain disorders involving the immune mechanism: Secondary | ICD-10-CM

## 2020-06-16 DIAGNOSIS — M25522 Pain in left elbow: Secondary | ICD-10-CM

## 2020-06-16 DIAGNOSIS — M7061 Trochanteric bursitis, right hip: Secondary | ICD-10-CM

## 2020-06-16 DIAGNOSIS — E559 Vitamin D deficiency, unspecified: Secondary | ICD-10-CM

## 2020-06-16 DIAGNOSIS — Z79899 Other long term (current) drug therapy: Secondary | ICD-10-CM | POA: Diagnosis not present

## 2020-06-16 DIAGNOSIS — M25521 Pain in right elbow: Secondary | ICD-10-CM

## 2020-06-16 DIAGNOSIS — M19041 Primary osteoarthritis, right hand: Secondary | ICD-10-CM

## 2020-06-16 DIAGNOSIS — M7918 Myalgia, other site: Secondary | ICD-10-CM

## 2020-06-16 DIAGNOSIS — Z840 Family history of diseases of the skin and subcutaneous tissue: Secondary | ICD-10-CM

## 2020-06-16 DIAGNOSIS — M170 Bilateral primary osteoarthritis of knee: Secondary | ICD-10-CM

## 2020-06-16 DIAGNOSIS — Z8719 Personal history of other diseases of the digestive system: Secondary | ICD-10-CM

## 2020-06-16 DIAGNOSIS — M359 Systemic involvement of connective tissue, unspecified: Secondary | ICD-10-CM | POA: Diagnosis not present

## 2020-06-16 DIAGNOSIS — M7062 Trochanteric bursitis, left hip: Secondary | ICD-10-CM

## 2020-06-16 DIAGNOSIS — Z8261 Family history of arthritis: Secondary | ICD-10-CM

## 2020-06-16 NOTE — Patient Instructions (Signed)
Standing Labs We placed an order today for your standing lab work.   Please have your standing labs drawn in March and every 5 months  If possible, please have your labs drawn 2 weeks prior to your appointment so that the provider can discuss your results at your appointment.  We have open lab daily Monday through Thursday from 8:30-12:30 PM and 1:30-4:30 PM and Friday from 8:30-12:30 PM and 1:30-4:00 PM at the office of Dr. [name], [facility].   Please be advised, patients with office appointments requiring lab work will take precedents over walk-in lab work.  If possible, please come for your lab work on Monday and Friday afternoons, as you may experience shorter wait times. The office is located at [address] [place], [place], [place] [address] No appointment is necessary.   Labs are drawn by [facility]. Please bring your co-pay at the time of your lab draw.  You may receive a bill from [facility] for your lab work.  If you wish to have your labs drawn at another location, please call the office 24 hours in advance to send orders.  If you have any questions regarding directions or hours of operation,  please call [phone].   As a reminder, please drink plenty of water prior to coming for your lab work. Thanks!

## 2020-06-30 ENCOUNTER — Encounter: Payer: Self-pay | Admitting: Medical

## 2020-06-30 ENCOUNTER — Other Ambulatory Visit: Payer: BC Managed Care – PPO

## 2020-06-30 ENCOUNTER — Telehealth: Payer: BC Managed Care – PPO | Admitting: Medical

## 2020-06-30 ENCOUNTER — Other Ambulatory Visit: Payer: Self-pay

## 2020-06-30 VITALS — Ht 64.0 in | Wt 125.0 lb

## 2020-06-30 DIAGNOSIS — R0981 Nasal congestion: Secondary | ICD-10-CM | POA: Diagnosis not present

## 2020-06-30 DIAGNOSIS — J3489 Other specified disorders of nose and nasal sinuses: Secondary | ICD-10-CM

## 2020-06-30 DIAGNOSIS — R0982 Postnasal drip: Secondary | ICD-10-CM

## 2020-06-30 DIAGNOSIS — Z20822 Contact with and (suspected) exposure to covid-19: Secondary | ICD-10-CM

## 2020-06-30 MED ORDER — AFRIN NASAL SPRAY 0.05 % NA SOLN: 1.0000 | Freq: Two times a day (BID) | NASAL | 0 refills | Status: DC

## 2020-06-30 MED ORDER — CLARITHROMYCIN 500 MG PO TABS: 500.0000 mg | ORAL_TABLET | Freq: Two times a day (BID) | ORAL | 0 refills | Status: DC

## 2020-06-30 MED ORDER — FLUTICASONE PROPIONATE 50 MCG/ACT NA SUSP: 2.0000 | Freq: Every day | NASAL | 6 refills | Status: DC

## 2020-06-30 NOTE — Progress Notes (Signed)
Subjective:     Patient ID: Shari Lawrence, female   DOB: 1960-09-01, 60 y.o.   MRN: [id number]  This visit type was conducted due to national recommendations for restrictions regarding the COVID-19 Pandemic (e.g. social distancing) in an effort to limit this patient's exposure and mitigate transmission in our community.  Due to their co-morbid illnesses, this patient is at least at moderate risk for complications without adequate follow up.  This format is felt to be most appropriate for this patient at this time.    Documentation for virtual audio and video telecommunications through [place] encounter:  The patient was located at home. The provider was located in the office. The patient did consent to this visit and is aware of possible charges through their insurance for this visit.  The other persons participating in this telemedicine service were none. Time spent on call was 20 minutes and in review of previous records 20 minutes total.  This virtual service is not related to other E/M service within previous 7 days.   HPI Chief Complaint  Patient presents with  . Sinusitis    Post nasal drip with cough. Symptoms started 06/26/20. Had PCR done today. No rapid was done.    Virtual consult for possible sinus infection.  She notes cough, sinus pressure, sinus drainage and post nasal drainage x 5 days.  If lying down, worse drainage and cough.  Using benadryl, xyzal.  Coughing mainly at night.  No fever.  No body aches or chills.   No NVD.  No loss of taste or smell.   No SOB or wheezing.  was out of town last week.    Did go for covid test today, PCR, but wont get results for a few days.    Has used some mucinex nasal spray.    Has had vaccine booster and regular covid vaccine.  No other aggravating or relieving factors. No other complaint.  Past Medical History:  Diagnosis Date  . Abnormal Pap smear 1988   treated with cryo  . Anemia 11/2010   hematology consult  prior; etiology malabsorption and uterine bleeding  . Anxiety   . Gastric polyp   . GERD (gastroesophageal reflux disease)   . H/O bone density study 12/2007  . H/O hysterectomy for benign disease 5/14  . History of echocardiogram 03/2010   normal LV function, EF 60-65%, mild left atrial enlargement  . HTN (hypertension) 03/2010   hospitalization for HTN urgency  . Hyperlipemia   . Internal hemorrhoids   . Lumbar degenerative disc disease   . Migraine   . Myalgia   . Numbness and tingling   . Prediabetes   . Spondylosis, cervical   . Umbilical hernia   . Uterine fibroid    hx/o     Review of Systems As in subjective    Objective:   Physical Exam Due to coronavirus pandemic stay at home measures, patient visit was virtual and they were not examined in person.   Ht 5\' 4"  (1.626 m)   Wt 125 lb (56.7 kg)   LMP 10/25/2012 (Approximate)   BMI 21.46 kg/m  Gen: wd, wn, nad Mild coughing, no dyspnea or wheezing Answers questions in complete sentences     Assessment:     Encounter Diagnoses  Name Primary?  . Sinus pressure Yes  . Head congestion   . Post-nasal drainage        Plan:     She should be getting her PCR Covid test back  in a couple days.  In the meantime advise rest, hydration, can use Mucinex tablet over-the-counter or Benadryl for example for congestion, advise she add nasal saline flush and salt water gargles.  We discussed technique for this.  Can you short-term Afrin for days or less.  She will stop the Mucinex nasal spray she is using.  If much worse or not improving the next few days call back.  If Covid test negative and ongoing sinus pressure she can begin Biaxin.   She voiced understanding and agreement of plan  Shari Lawrence was seen today for sinusitis.  Diagnoses and all orders for this visit:  Sinus pressure  Head congestion  Post-nasal drainage  Other orders -     clarithromycin (BIAXIN) 500 MG tablet; Take 1 tablet (500 mg total) by mouth 2  (two) times daily. -     oxymetazoline (AFRIN NASAL SPRAY) 0.05 % nasal spray; Place 1 spray into both nostrils 2 (two) times daily. -     fluticasone (FLONASE) 50 MCG/ACT nasal spray; Place 2 sprays into both nostrils daily.  f/u pending covid results

## 2020-07-01 LAB — NOVEL CORONAVIRUS, NAA: SARS-CoV-2, NAA: NOT DETECTED

## 2020-07-01 LAB — SARS-COV-2, NAA 2 DAY TAT

## 2020-07-02 ENCOUNTER — Institutional Professional Consult (permissible substitution): Payer: BC Managed Care – PPO | Admitting: Neurology

## 2020-07-15 ENCOUNTER — Other Ambulatory Visit: Payer: Self-pay | Admitting: Medical

## 2020-07-15 ENCOUNTER — Telehealth: Payer: Self-pay

## 2020-07-15 MED ORDER — DOXYCYCLINE MONOHYDRATE 100 MG PO TABS: 100.0000 mg | ORAL_TABLET | Freq: Two times a day (BID) | ORAL | 0 refills | Status: DC

## 2020-07-15 NOTE — Telephone Encounter (Signed)
I sent doxycycline as an alternative.  Given her multiple drug allergies to antibiotics this is about the only other option instead of Biaxin.

## 2020-07-15 NOTE — Telephone Encounter (Signed)
Pt states she went to pick up the Biaxin and pharmacist told her that it might interfere with Biaxin and her heart rate and she would like a different antibiotic called in.

## 2020-07-16 NOTE — Telephone Encounter (Signed)
Called pt and informed

## 2020-07-20 ENCOUNTER — Other Ambulatory Visit: Payer: Self-pay | Admitting: Medical

## 2020-07-24 ENCOUNTER — Ambulatory Visit: Payer: BC Managed Care – PPO | Admitting: Family Medicine

## 2020-07-26 ENCOUNTER — Encounter: Payer: Self-pay | Admitting: Rheumatology

## 2020-07-27 NOTE — Telephone Encounter (Signed)
Contacted patient and offered her multiple appointments. Patient declined all appointments offered. Patient requested a call back if any afternoon appointments open up for Dr. Estanislado Pandy.

## 2020-07-27 NOTE — Telephone Encounter (Signed)
Reviewed pictures with Dr. Estanislado Pandy.    Please schedule an appointment with Dr. Estanislado Pandy for further evaluation.  She will likely require a referral to dermatology and we will update autoimmune lab work at that time.

## 2020-07-28 ENCOUNTER — Telehealth: Payer: Self-pay | Admitting: Medical

## 2020-07-28 ENCOUNTER — Encounter: Payer: Self-pay | Admitting: Neurology

## 2020-07-28 ENCOUNTER — Ambulatory Visit: Payer: BC Managed Care – PPO | Admitting: Neurology

## 2020-07-28 ENCOUNTER — Other Ambulatory Visit: Payer: Self-pay | Admitting: Neurology

## 2020-07-28 VITALS — BP 146/88 | HR 61 | Ht 64.0 in | Wt 129.0 lb

## 2020-07-28 DIAGNOSIS — G8929 Other chronic pain: Secondary | ICD-10-CM | POA: Diagnosis not present

## 2020-07-28 DIAGNOSIS — M546 Pain in thoracic spine: Secondary | ICD-10-CM

## 2020-07-28 DIAGNOSIS — M542 Cervicalgia: Secondary | ICD-10-CM | POA: Diagnosis not present

## 2020-07-28 MED ORDER — ALPRAZOLAM 1 MG PO TABS: ORAL_TABLET | ORAL | 0 refills | Status: DC

## 2020-07-28 NOTE — Telephone Encounter (Signed)
Pt. states she is claustrophrobic & is asking to be prescribed something for MRI. She asks that Dr. calls her back.

## 2020-07-28 NOTE — Telephone Encounter (Signed)
despite her good efforts at taking a picture, I cannot really tell on the picture what rash is present  Often rashes can improve with moisturizing lotion or over-the-counter hydrocortisone cream.  If she develops blisters, oozing, redness or pain or warmth then that would be different.  Unfortunately if she needs more answers on this I would need to see you in person as I cannot really make it out in the photo

## 2020-07-28 NOTE — Progress Notes (Signed)
Office Visit Note  Patient: Shari Lawrence             Date of Birth: 15-Aug-1960           MRN: [id number]             PCP: Carlena Hurl, PA-C Referring: Carlena Hurl, PA-C Visit Date: 07/29/2020 Occupation: @GUAROCC @  Subjective:  Other (Rash on bilateral legs )   History of Present Illness: Shari Lawrence is a 60 y.o. female with history of autoimmune disease and osteoarthritis.  According the patient about 6 days ago she developed a rash on her bilateral lower extremities.  She states the rash has been gradually improving although she still have some residual discoloration on her both legs.  The rash is only about the knee.  She is not sure if it was due to an autoimmune condition or something she wore which could have contacted her skin.  She denies any joint swelling or a flare of her autoimmune disease.  Activities of Daily Living:  Patient reports morning stiffness for  10 minutes.   Patient Reports nocturnal pain.  Difficulty dressing/grooming: Denies Difficulty climbing stairs: Reports Difficulty getting out of chair: Reports Difficulty using hands for taps, buttons, cutlery, and/or writing: Reports  Review of Systems  Constitutional: Positive for fatigue.  HENT: Positive for mouth sores and mouth dryness. Negative for nose dryness.   Eyes: Positive for pain and dryness. Negative for itching.  Respiratory: Negative for shortness of breath and difficulty breathing.   Cardiovascular: Negative for chest pain and palpitations.  Gastrointestinal: Positive for constipation. Negative for blood in stool and diarrhea.  Endocrine: Negative for increased urination.  Genitourinary: Negative for difficulty urinating.  Musculoskeletal: Positive for arthralgias, joint pain, myalgias, morning stiffness, muscle tenderness and myalgias. Negative for joint swelling.  Skin: Positive for rash. Negative for color change.  Allergic/Immunologic: Negative for susceptible  to infections.  Neurological: Positive for headaches. Negative for dizziness, numbness, memory loss and weakness.  Hematological: Negative for bruising/bleeding tendency.  Psychiatric/Behavioral: Negative for confusion.    PMFS History:  Patient Active Problem List   Diagnosis Date Noted  . Chronic right-sided thoracic back pain 07/28/2020  . Cervicalgia 07/28/2020  . Prediabetes   . Facial numbness 05/04/2020  . Leg numbness 05/04/2020  . Knee swelling 05/04/2020  . Facial pain 05/04/2020  . Ear pain, left 05/04/2020  . History of recent stressful life event 11/20/2019  . Panic attack 11/15/2019  . Fall 11/13/2019  . Pain in both hands 11/13/2019  . Sleep disturbance 11/13/2019  . Grief 11/13/2019  . Bilateral carpal tunnel syndrome 08/21/2019  . Chronic pain of both knees 06/14/2019  . Right arm pain 06/14/2019  . Close exposure to COVID-19 virus 06/14/2019  . Paresthesia 04/08/2019  . Neck pain 03/30/2019  . Radicular pain in right arm 03/30/2019  . Numbness of leg 03/30/2019  . Chest pain 07/24/2018  . Hypotension 07/17/2018  . Vertigo 07/17/2018  . Nausea 07/17/2018  . Vaccine counseling 05/21/2018  . Need for influenza vaccination 05/21/2018  . Routine general medical examination at a health care facility 05/21/2018  . Caregiver burden 07/05/2017  . Cyst of skin 07/05/2017  . Elevated blood pressure reading 06/28/2017  . Leg pain, diffuse, left 06/28/2017  . Leg swelling 06/28/2017  . Bruising 06/28/2017  . Myofascial muscle pain 06/28/2017  . High risk medication use 03/30/2017  . Chronic constipation 11/17/2016  . Globus sensation 11/17/2016  . Primary osteoarthritis of  both knees 10/05/2016  . Autoimmune disease ([place]) 09/29/2016  . Primary osteoarthritis of both hands 09/29/2016  . Family history of lupus erythematosus 09/20/2016  . Polyarthralgia 05/06/2016  . Clenching of teeth 05/06/2016  . Finger swelling 05/06/2016  . Morning stiffness of joints  05/06/2016  . Myalgia 05/06/2016  . Family history of rheumatoid arthritis 05/06/2016  . Essential hypertension 12/23/2014  . History of anemia 12/23/2014  . Care provider for parents 12/23/2014  . Post-operative state 11/13/2012  . Hyperlipidemia 11/11/2012  . Hypertension 11/11/2012  . Impaired fasting glucose 10/25/2011  . Gluten intolerance 10/25/2011  . Gastroesophageal reflux disease without esophagitis 10/25/2011  . Anxiety   . Iron deficiency anemia     Past Medical History:  Diagnosis Date  . Abnormal Pap smear 1988   treated with cryo  . Anemia 11/2010   hematology consult prior; etiology malabsorption and uterine bleeding  . Anxiety   . Gastric polyp   . GERD (gastroesophageal reflux disease)   . H/O bone density study 12/2007  . H/O hysterectomy for benign disease 5/14  . History of echocardiogram 03/2010   normal LV function, EF 60-65%, mild left atrial enlargement  . HTN (hypertension) 03/2010   hospitalization for HTN urgency  . Hyperlipemia   . Internal hemorrhoids   . Lumbar degenerative disc disease   . Migraine   . Myalgia   . Numbness and tingling   . Prediabetes   . Spondylosis, cervical   . Umbilical hernia   . Uterine fibroid    hx/o     Family History  Problem Relation Age of Onset  . Hypertension Mother   . Dementia Mother   . Colon cancer Mother 74  . Deep vein thrombosis Father   . Hypertension Father   . Cancer Father        prostate CA  . Heart disease Father 70       CAD  . Parkinsonism Father   . Hypertension Sister   . Hyperlipidemia Sister   . Sjogren's syndrome Sister   . Hypertension Brother   . Hyperlipidemia Brother   . Hypertension Maternal Aunt   . Hyperlipidemia Maternal Aunt   . Rheum arthritis Maternal Aunt   . Diabetes Maternal Aunt        1 with type II, 1 with type 1  . Hypertension Brother   . Rheum arthritis Brother   . Diabetes Paternal Aunt        type II  . Breast cancer Neg Hx    Past Surgical  History:  Procedure Laterality Date  . ABDOMINAL HYSTERECTOMY    . CERVICAL FUSION  2010  . CERVICAL SPINE SURGERY    . CERVIX LESION DESTRUCTION  1988  . CESAREAN SECTION    . CHOLECYSTECTOMY  2003  . COLONOSCOPY  08/2010   Dr. Collene Mares  . COLONOSCOPY  02/2017   Dr. Collene Mares  . ESOPHAGOGASTRODUODENOSCOPY  08/2010   Dr. Collene Mares  . EXPLORATORY LAPAROTOMY    . HERNIA REPAIR    . MYOMECTOMY  1998  . PELVIC LAPAROSCOPY    . ROBOTIC ASSISTED LAPAROSCOPIC HYSTERECTOMY AND SALPINGECTOMY  10/2012   [facility]; laproscopic due to fibroids  . 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  Has 85 yo daughter.  Her siblings and her don't agree on helping take care of their parents, causes family tension.  As of 11/2014      Right-handed.   0.5 cup caffeine per day.   Her father lives with her.      Update 09/2019--father passed away from urosepsis in 2019-09-05   Immunization History  Administered Date(s) Administered  . Influenza,inj,Quad PF,6+ Mos 05/01/2013, 03/31/2014, 05/17/2017, 05/21/2018, 03/29/2019, 04/10/2020  . PFIZER(Purple Top)SARS-COV-2 Vaccination 08/24/2019, 09/17/2019, 03/30/2020  . Td 02/04/2012     Objective: Vital Signs: BP (!) 143/94 (BP Location: Left Arm, Patient Position: Sitting, Cuff Size: Normal)   Pulse 60   Resp 13   Ht 5\' 4"  (1.626 m)   Wt 131 lb (59.4 kg)   LMP 10/25/2012 (Approximate)   BMI 22.49 kg/m    Physical Exam Vitals and nursing note reviewed.  Constitutional:      Appearance: She is well-developed and well-nourished.  HENT:     Head: Normocephalic and atraumatic.  Eyes:     Extraocular Movements: EOM normal.     Conjunctiva/sclera: Conjunctivae  normal.  Cardiovascular:     Rate and Rhythm: Normal rate and regular rhythm.     Pulses: Intact distal pulses.     Heart sounds: Normal heart sounds.  Pulmonary:     Effort: Pulmonary effort is normal.     Breath sounds: Normal breath sounds.  Abdominal:     General: Bowel sounds are normal.     Palpations: Abdomen is soft.  Musculoskeletal:     Cervical back: Normal range of motion.  Lymphadenopathy:     Cervical: No cervical adenopathy.  Skin:    General: Skin is warm and dry.     Capillary Refill: Capillary refill takes less than 2 seconds.     Comments: She had hyperpigmented circumferential narrowband about 3 inches above her knees bilaterally.  No erythema was noted.  Neurological:     Mental Status: She is alert and oriented to person, place, and time.  Psychiatric:        Mood and Affect: Mood and affect normal.        Behavior: Behavior normal.      Musculoskeletal Exam: C-spine thoracic and lumbar spine with good range of motion.  Shoulder joints, elbow joints, wrist joints, MCPs PIPs and DIPs with good range of motion with no synovitis.  Hip joints, knee joints, ankles, MTPs creatinine good range of motion with no synovitis. CDAI Exam: CDAI Score: -- Patient Global: --; Provider Global: -- Swollen: --; Tender: -- Joint Exam 07/29/2020   No joint exam has been documented for this visit   There is currently no information documented on the homunculus. Go to the Rheumatology activity and complete the homunculus joint exam.  Investigation: No additional findings.  Imaging: No results found.  Recent Labs: Lab Results  Component Value Date   WBC 5.1 04/16/2020   HGB 12.6 04/16/2020   PLT 185 04/16/2020   NA 141 04/16/2020   K 3.8 04/16/2020   CL 105 04/16/2020   CO2 23 04/16/2020   GLUCOSE 97 04/16/2020   BUN 10 04/16/2020   CREATININE 0.86 04/16/2020   BILITOT 0.6 04/16/2020   ALKPHOS 72 04/16/2020   AST 34 04/16/2020   ALT 32 04/16/2020   PROT 7.2  04/16/2020   ALBUMIN 4.4 04/16/2020   CALCIUM 9.8 04/16/2020   GFRAA 80 03/19/2020    Speciality  Comments: PLQ Eye Exam: 06/03/2020 WNL @ [facility]. Follow up in 1 year.  Procedures:  No procedures performed Allergies: Quinolones, Garlic, Latex, Levaquin [levofloxacin], Losartan, Metoprolol, Onion, Penicillins, Percocet [oxycodone-acetaminophen], Sulfa drugs cross reactors, Vicodin [hydrocodone-acetaminophen], and Gluten meal   Assessment / Plan:     Visit Diagnoses: Rash-patient reports about 6 days ago she developed redness below her both knees which is gradually improving.  She is left with some residual discoloration.  On examination I could see a hyperpigmented circumferential narrow band about 3 inches above bilateral knee joints.  It appears that she might be wearing some tight leggings which could have compressed in that region.  Have advised her to observe for now.  No treatment was advised.  Autoimmune disease ([place]) - ANA 1:320 NO, ds8, RF-,CCP-,History of nasal ulcers and oral ulcers in the past, positive synovitis in left hand MCPs on [place] previously: She had no synovitis on examination.  She is clinically doing well without a flare.  Sicca complex (HCC)-the symptoms are manageable.  High risk medication use - PLQ 200 mg 1 tablet by mouth daily twice daily M-F only. PLQ Eye Exam: 06/03/2020  Primary osteoarthritis of both hands-she denies any discomfort today.  Pain of both elbows  Primary osteoarthritis of both knees  Myofascial muscle pain  Trochanteric bursitis of both hips  Family history of lupus erythematosus  Family history of rheumatoid arthritis  History of anxiety  History of anemia  History of hypertension  History of hyperlipidemia  History of gastroesophageal reflux (GERD)  Orders: No orders of the defined types were placed in this encounter.  No orders of the defined types were placed in this encounter.    Follow-Up Instructions: Return  for Autoimmune disease.   [name], MD  Note - This record has been created using Editor, commissioning.  Chart creation errors have been sought, but may not always  have been located. Such creation errors do not reflect on  the standard of medical care.

## 2020-07-28 NOTE — Telephone Encounter (Signed)
I spoke to patient and she has used alprazolam with benefit in the past. Rx sent to Dr.Yan for approval (per MRI protocol).

## 2020-07-28 NOTE — Addendum Note (Signed)
Addended by: Noberto Retort C on: 07/28/2020 12:10 PM   Modules accepted: Orders

## 2020-07-28 NOTE — Telephone Encounter (Signed)
Patient informed. 

## 2020-07-28 NOTE — Progress Notes (Signed)
Chief Complaint  Patient presents with  . Consult    Previously seen for neck pain/CTS. She had recent ED visit for left-sided facial numbness. Continued neck, shoulder, back pain. She will sometimes have sharp pain in bottom of feet and toes.     HISTORICAL Shari Lawrence is a 60 year old female, seen in request by rheumatologist Dr. [name], and primary care physician Dr. Carlena Hurl, for evaluation of neck pain, radiating pain to right arm, initial evaluation was on April 08, 2019.  I have reviewed and summarized the referring note from the referring physician.  She has past medical history of hypertension, hyperlipidemia, autoimmune disease, taking Plaquenil.  She did have a history of cervical decompression surgery in 2010, prior to the surgery, she presented with right cervical radiculopathy.  Since April 2020, she began to notice recurrent right neck pain, radiating pain to right shoulder, right arm, sometimes woke her up from sleep, she is helping her elderly parents, sometimes bearing weight of her father with her right shoulder, in addition, she complains of intermittent bilateral lower extremity numbness since Jan 05, 2019, denies significant gait abnormality, no bowel bladder incontinence.  She also complains of low back pain, but denies shooting pain to bilateral lower extremities.  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  REVIEW OF SYSTEMS: Full 14 system review of systems performed and notable only for as above All other review of systems were negative.  ALLERGIES: Allergies  Allergen Reactions  . Quinolones Anaphylaxis and Other (See Comments)    Whelps developed all over her body  . Garlic   . Latex Itching and Other (See Comments)    Skin yellowing  . Levaquin [Levofloxacin] Hives    Throat swelling  . Losartan     Intolerance, fluctuating BPs  . Metoprolol     Self reported hypotension  . Onion   . Penicillins   . Percocet [Oxycodone-Acetaminophen]   . Sulfa Drugs Cross Reactors Itching, Swelling and Other (See Comments)    Red face  . Vicodin [Hydrocodone-Acetaminophen] Itching and Nausea And Vomiting  . Gluten Meal     Other reaction(s): Other (See Comments) IBS    HOME MEDICATIONS: Current Outpatient Medications  Medication Sig Dispense Refill  . acetaminophen (TYLENOL) 500 MG tablet Take 1 tablet (500 mg total) by mouth every 6 (six) hours as needed. 30 tablet 0  . amLODipine (NORVASC) 5 MG tablet Take 1 tablet (5 mg total) by mouth daily. 90 tablet 3  . Ascorbic Acid (VITAMIN C) 1000 MG tablet Take 1,000 mg by mouth daily.    Marland Kitchen aspirin EC 81 MG tablet Take 81 mg by mouth as needed.    Marland Kitchen atorvastatin (LIPITOR) 20 MG tablet TAKE 1 TABLET BY MOUTH EVERY DAY 90 tablet 0  . B Complex Vitamins (B COMPLEX PO) Take 1 tablet by mouth daily.    Marland Kitchen  calcium carbonate (OS-CAL) 600 MG TABS Take 600 mg by mouth daily.    Marland Kitchen CINNAMON PO Take 1 tablet by mouth daily.     . diclofenac sodium (VOLTAREN) 1 % GEL 3 grams to 3 large joints up to 3 times daily 3 Tube 3  . doxycycline (ADOXA) 100 MG tablet Take 1 tablet (100 mg total) by mouth 2 (two) times daily. 20 tablet 0  . escitalopram (LEXAPRO) 10 MG tablet Take 0.5 tablets (5 mg total) by mouth daily. 30 tablet 0  .  estradiol (VIVELLE-DOT) 0.0375 MG/24HR PLACE 1 PATCH ONTO THE SKIN 2 (TWO) TIMES A WEEK. 24 patch 0  . fish oil-omega-3 fatty acids 1000 MG capsule Take 2 g by mouth daily.    . fluticasone (FLONASE) 50 MCG/ACT nasal spray Place 2 sprays into both nostrils daily. 16 g 6  . hydroxychloroquine (PLAQUENIL) 200 MG tablet TAKE ONE TABLET BY MOUTH TWICE DAILY MONDAY- FRIDAY ONLY 120 tablet 0  . LORazepam (ATIVAN) 0.5 MG tablet Take 0.25-0.5 mg by mouth as needed for anxiety.    Marland Kitchen lubiprostone (AMITIZA) 24 MCG capsule Take 1 capsule (24 mcg total) by mouth 2 (two) times daily with a meal. 180 capsule 1  . Multiple Vitamin (MULTIVITAMIN) tablet Take 1 tablet by mouth daily.    Marland Kitchen NEXIUM 40 MG capsule Take 40 mg by mouth daily.     Marland Kitchen oxymetazoline (AFRIN NASAL SPRAY) 0.05 % nasal spray Place 1 spray into both nostrils 2 (two) times daily. 30 mL 0  . Probiotic Product (PROBIOTIC ADVANCED PO) Take 1 tablet by mouth daily.     . TURMERIC PO Take 1 tablet by mouth daily.      No current facility-administered medications for this visit.    PAST MEDICAL HISTORY: Past Medical History:  Diagnosis Date  . Abnormal Pap smear 1988   treated with cryo  . Anemia 11/2010   hematology consult prior; etiology malabsorption and uterine bleeding  . Anxiety   . Gastric polyp   . GERD (gastroesophageal reflux disease)   . H/O bone density study 12/2007  . H/O hysterectomy for benign disease 5/14  . History of echocardiogram 03/2010   normal LV function, EF 60-65%, mild left atrial enlargement  . HTN (hypertension) 03/2010   hospitalization for HTN urgency  . Hyperlipemia   . Internal hemorrhoids   . Lumbar degenerative disc disease   . Migraine   . Myalgia   . Numbness and tingling   . Prediabetes   . Spondylosis, cervical   . Umbilical hernia   . Uterine fibroid    hx/o     PAST SURGICAL HISTORY: Past Surgical History:  Procedure Laterality Date  . ABDOMINAL HYSTERECTOMY    . CERVICAL FUSION  2010   . CERVICAL SPINE SURGERY    . CERVIX LESION DESTRUCTION  1988  . CESAREAN SECTION    . CHOLECYSTECTOMY  2003  . COLONOSCOPY  08/2010   Dr. Collene Mares  . COLONOSCOPY  02/2017   Dr. Collene Mares  . ESOPHAGOGASTRODUODENOSCOPY  08/2010   Dr. Collene Mares  . EXPLORATORY LAPAROTOMY    . HERNIA REPAIR    . MYOMECTOMY  1998  . PELVIC LAPAROSCOPY    . ROBOTIC ASSISTED LAPAROSCOPIC HYSTERECTOMY AND SALPINGECTOMY  10/2012   [facility]; laproscopic due to fibroids  . UMBILICAL HERNIA REPAIR  10/2013   infected laparoscopic port, mesh placement    FAMILY HISTORY: Family History  Problem Relation Age of Onset  . Hypertension Mother   .  Dementia Mother   . Colon cancer Mother 25  . Deep vein thrombosis Father   . Hypertension Father   . Cancer Father        prostate CA  . Heart disease Father 51       CAD  . Parkinsonism Father   . Hypertension Sister   . Hyperlipidemia Sister   . Sjogren's syndrome Sister   . Hypertension Brother   . Hyperlipidemia Brother   . Hypertension Maternal Aunt   . Hyperlipidemia Maternal Aunt   . Rheum arthritis Maternal Aunt   . Diabetes Maternal Aunt        1 with type II, 1 with type 1  . Hypertension Brother   . Rheum arthritis Brother   . Diabetes Paternal Aunt        type II  . Breast cancer Neg Hx     SOCIAL HISTORY: Social History   Socioeconomic History  . Marital status: Divorced    Spouse name: Not on file  . Number of children: 1  . Years of education: college  . Highest education level: Doctorate  Occupational History    Employer: [employer]    Comment: molecular Production designer, theatre/television/film, PhD candidate, [employer]  Tobacco Use  . Smoking status: Never Smoker  . Smokeless tobacco: Never Used  Vaping Use  . Vaping Use: Never used  Substance and Sexual Activity  . Alcohol use: No  . Drug use: No  . Sexual activity: Not Currently    Partners: Male    Birth control/protection: Surgical    Comment: hysterectomy  Other Topics Concern  . 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  Has 71 yo daughter.  Her siblings and her don't agree on helping take care of their parents, causes family tension.  As of 11/2014      Right-handed.   0.5 cup caffeine per day.   Her father lives with her.      Update 09/2019--father passed away from urosepsis in 09/01/19   Social Determinants of Health   Financial Resource Strain: Not on file  Food Insecurity: Not on file  Transportation Needs: Not on file  Physical Activity: Not on file  Stress: Not on file  Social Connections: Not on file  Intimate Partner Violence: Not on file     PHYSICAL EXAM   Vitals:   07/28/20 0940  BP: (!) 146/88  Pulse: 61  Weight: 129 lb (58.5 kg)  Height: 5\' 4"  (1.626 m)   Not recorded     Body mass index is 22.14 kg/m.  PHYSICAL EXAMNIATION:  Gen: NAD, conversant, well nourised, well groomed                     Cardiovascular: Regular rate rhythm, no peripheral edema, warm, nontender. Eyes: Conjunctivae clear without exudates or hemorrhage Neck: Supple, no carotid bruits. Pulmonary: Clear to auscultation bilaterally   NEUROLOGICAL EXAM:  MENTAL STATUS: Speech:    Speech is normal; fluent and spontaneous with normal comprehension.  Cognition:     Orientation to time, place and person     Normal recent and remote memory     Normal Attention span and concentration     Normal Language, naming, repeating,spontaneous speech  Fund of knowledge   CRANIAL NERVES: CN II: Visual fields are full to confrontation. Pupils are round equal and briskly reactive to light. CN III, IV, VI: extraocular movement are normal. No ptosis. CN V: Facial sensation is intact to light touch CN VII: Face is symmetric with normal eye closure   CN VIII: Hearing is normal to causal conversation. CN IX, X: Phonation is normal. CN XI: Head turning and shoulder shrug are intact  MOTOR: There is no pronator drift of out-stretched arms. Muscle bulk and tone are normal. Muscle strength is normal.  REFLEXES: Reflexes are 2+ and symmetric at the biceps, triceps, 3/3 knees, and ankles. Plantar responses are flexor.  SENSORY: Intact to light touch, pinprick and vibratory sensation are intact in fingers and toes.  COORDINATION: There is no trunk or limb dysmetria noted.  GAIT/STANCE: She can get up from seated position arm crossed, steady  DIAGNOSTIC DATA (LABS, IMAGING, TESTING) - I reviewed patient records, labs, notes, testing and imaging myself where available. Next  ASSESSMENT AND PLAN  Lawrencia Deats is a 60 y.o. female   History of cervical decompression surgery  Now with recurrent neck pain, radiating pain to right shoulder, upper extremity, MRI of cervical spine to rule out cervical spondylitic myelopathy   Persistent right upper thoracic area radiating pain,  Hyperreflexia on examinations  Proceed with MRI of thoracic spine to rule out degenerative changes  Continue heating pad, stretching exercise, as needed NSAIDs   Marcial Pacas, M.D. Ph.D.  [facility] [address], [place], [place] [address] Ph: [phone] Fax: [phone]  CC:  Tysinger, [name], PA-C [address] [place],  [place] [address]  Tysinger, [name], PA-C

## 2020-07-29 ENCOUNTER — Encounter: Payer: Self-pay | Admitting: Rheumatology

## 2020-07-29 ENCOUNTER — Telehealth: Payer: Self-pay | Admitting: Neurology

## 2020-07-29 ENCOUNTER — Ambulatory Visit: Payer: BC Managed Care – PPO | Admitting: Rheumatology

## 2020-07-29 ENCOUNTER — Other Ambulatory Visit: Payer: Self-pay

## 2020-07-29 VITALS — BP 143/94 | HR 60 | Resp 13 | Ht 64.0 in | Wt 131.0 lb

## 2020-07-29 DIAGNOSIS — Z79899 Other long term (current) drug therapy: Secondary | ICD-10-CM

## 2020-07-29 DIAGNOSIS — M25521 Pain in right elbow: Secondary | ICD-10-CM

## 2020-07-29 DIAGNOSIS — Z8639 Personal history of other endocrine, nutritional and metabolic disease: Secondary | ICD-10-CM

## 2020-07-29 DIAGNOSIS — R21 Rash and other nonspecific skin eruption: Secondary | ICD-10-CM | POA: Diagnosis not present

## 2020-07-29 DIAGNOSIS — Z8679 Personal history of other diseases of the circulatory system: Secondary | ICD-10-CM

## 2020-07-29 DIAGNOSIS — M25522 Pain in left elbow: Secondary | ICD-10-CM

## 2020-07-29 DIAGNOSIS — M3500 Sicca syndrome, unspecified: Secondary | ICD-10-CM | POA: Diagnosis not present

## 2020-07-29 DIAGNOSIS — M7061 Trochanteric bursitis, right hip: Secondary | ICD-10-CM

## 2020-07-29 DIAGNOSIS — Z8261 Family history of arthritis: Secondary | ICD-10-CM

## 2020-07-29 DIAGNOSIS — M359 Systemic involvement of connective tissue, unspecified: Secondary | ICD-10-CM | POA: Diagnosis not present

## 2020-07-29 DIAGNOSIS — M19042 Primary osteoarthritis, left hand: Secondary | ICD-10-CM

## 2020-07-29 DIAGNOSIS — M7918 Myalgia, other site: Secondary | ICD-10-CM

## 2020-07-29 DIAGNOSIS — M19041 Primary osteoarthritis, right hand: Secondary | ICD-10-CM

## 2020-07-29 DIAGNOSIS — Z840 Family history of diseases of the skin and subcutaneous tissue: Secondary | ICD-10-CM

## 2020-07-29 DIAGNOSIS — Z862 Personal history of diseases of the blood and blood-forming organs and certain disorders involving the immune mechanism: Secondary | ICD-10-CM

## 2020-07-29 DIAGNOSIS — Z8719 Personal history of other diseases of the digestive system: Secondary | ICD-10-CM

## 2020-07-29 DIAGNOSIS — Z8659 Personal history of other mental and behavioral disorders: Secondary | ICD-10-CM

## 2020-07-29 DIAGNOSIS — M170 Bilateral primary osteoarthritis of knee: Secondary | ICD-10-CM

## 2020-07-29 DIAGNOSIS — M7062 Trochanteric bursitis, left hip: Secondary | ICD-10-CM

## 2020-07-29 NOTE — Telephone Encounter (Signed)
Patient returned my call she is scheduled at [facility] for 08/04/20.

## 2020-07-29 NOTE — Telephone Encounter (Signed)
LVM for pt to call back about scheduling mri  BCBS auth: [id number] (exp. 07/29/20 to 01/24/21)

## 2020-08-04 ENCOUNTER — Ambulatory Visit: Payer: BC Managed Care – PPO

## 2020-08-04 ENCOUNTER — Other Ambulatory Visit: Payer: BC Managed Care – PPO

## 2020-08-04 DIAGNOSIS — M542 Cervicalgia: Secondary | ICD-10-CM

## 2020-08-04 DIAGNOSIS — M546 Pain in thoracic spine: Secondary | ICD-10-CM | POA: Diagnosis not present

## 2020-08-04 DIAGNOSIS — G8929 Other chronic pain: Secondary | ICD-10-CM

## 2020-08-05 ENCOUNTER — Other Ambulatory Visit: Payer: Self-pay | Admitting: Rheumatology

## 2020-08-05 DIAGNOSIS — M359 Systemic involvement of connective tissue, unspecified: Secondary | ICD-10-CM

## 2020-08-05 NOTE — Telephone Encounter (Signed)
Last Visit: 07/29/2020 Next Visit: 11/11/2020 Labs: 04/16/2020, normal Eye exam: 06/03/2020  Current Dose per office note 07/29/2020, PLQ 200 mg 1 tablet by mouth daily twice daily M-F only UR:[id number] disease  Last Fill: 05/13/2020  Okay to refill Plaquenil?

## 2020-08-06 ENCOUNTER — Telehealth: Payer: Self-pay | Admitting: Neurology

## 2020-08-06 NOTE — Telephone Encounter (Signed)
Pt notified of results and verbalized understanding  

## 2020-08-06 NOTE — Telephone Encounter (Signed)
  IMPRESSION:   Unremarkable MRI thoracic spine without contrast.  No spinal stenosis or foraminal narrowing.    MRI cervical spine without contrast demonstrating: - C4-5 minimal disc bulging and uncovertebral joint hypertrophy resulting in borderline mild spinal stenosis and moderate bilateral foraminal stenosis; no cord signal abnormalities. - C7-T1 disc bulging and uncovertebral joint hypertrophy with moderate right and mild left foraminal stenosis - Anterior cervical discectomy and fusion spanning C5, C6 and C7 levels with metallic hardware.  Please call patient, MRI of thoracic spine showed no significant abnormality, MRI of cervical spine showed mild degenerative changes, evidence of previous surgery from C5, 6, 7 level, no evidence of spinal cord compression, variable degree of foraminal narrowing

## 2020-08-19 ENCOUNTER — Ambulatory Visit: Payer: BC Managed Care – PPO | Admitting: Rheumatology

## 2020-09-08 ENCOUNTER — Other Ambulatory Visit: Payer: Self-pay | Admitting: Obstetrics and Gynecology

## 2020-09-08 DIAGNOSIS — Z1231 Encounter for screening mammogram for malignant neoplasm of breast: Secondary | ICD-10-CM

## 2020-09-09 ENCOUNTER — Ambulatory Visit: Payer: BC Managed Care – PPO | Admitting: Obstetrics and Gynecology

## 2020-09-10 ENCOUNTER — Other Ambulatory Visit: Payer: Self-pay

## 2020-09-10 ENCOUNTER — Encounter: Payer: Self-pay | Admitting: Obstetrics and Gynecology

## 2020-09-10 ENCOUNTER — Ambulatory Visit: Payer: BC Managed Care – PPO | Admitting: Obstetrics and Gynecology

## 2020-09-10 VITALS — BP 122/84 | HR 64 | Ht 64.0 in | Wt 130.4 lb

## 2020-09-10 DIAGNOSIS — R300 Dysuria: Secondary | ICD-10-CM | POA: Diagnosis not present

## 2020-09-10 DIAGNOSIS — N949 Unspecified condition associated with female genital organs and menstrual cycle: Secondary | ICD-10-CM

## 2020-09-10 DIAGNOSIS — N952 Postmenopausal atrophic vaginitis: Secondary | ICD-10-CM | POA: Diagnosis not present

## 2020-09-10 LAB — WET PREP FOR TRICH, YEAST, CLUE

## 2020-09-10 NOTE — Progress Notes (Signed)
GYNECOLOGY  VISIT   HPI: 60 y.o.   Divorced Black or [place] American Not Hispanic or Latino  female   [id number] with Patient's last menstrual period was 10/25/2012 (approximate). here for Vaginal pain and burning.   She has a h/o a hysterectomy and is on the 0.025 mg estradiol patch in the last 6 weeks. Her Rheumatologist would prefer that she go off of the estradiol, didn't say she had to go off it. She weaned down from the 0.0375 mg patch to the 0.025 mg patch. Her symptoms are overall tolerable. So hot flashes, no night sweats. Doesn't sleep well in general, she has started exercising and that is helping some.  She c/o a 3 day h/o burning with urination, may be burning externally, sensitive to wipe. No itching or irritation. She saw a slight vaginal d/c on Monday with a tinge of pink in it.  The burning can last for a while after she burns, may be external.  Slight increase frequency of urination.   Not sexually active.   Still dealing with anxiety.   GYNECOLOGIC HISTORY: Patient's last menstrual period was 10/25/2012 (approximate). Contraception:post hysterectomy  Menopausal hormone therapy: Vivelle Dot.         OB History    Gravida  1   Para  1   Term  1   Preterm  0   AB  0   Living  1     SAB  0   IAB  0   Ectopic  0   Multiple  0   Live Births  1              Patient Active Problem List   Diagnosis Date Noted  . Chronic right-sided thoracic back pain 07/28/2020  . Cervicalgia 07/28/2020  . Prediabetes   . Facial numbness 05/04/2020  . Leg numbness 05/04/2020  . Knee swelling 05/04/2020  . Facial pain 05/04/2020  . Ear pain, left 05/04/2020  . History of recent stressful life event 11/20/2019  . Panic attack 11/15/2019  . Fall 11/13/2019  . Pain in both hands 11/13/2019  . Sleep disturbance 11/13/2019  . Grief 11/13/2019  . Bilateral carpal tunnel syndrome 08/21/2019  . Chronic pain of both knees 06/14/2019  . Right arm pain 06/14/2019  .  Close exposure to COVID-19 virus 06/14/2019  . Paresthesia 04/08/2019  . Neck pain 03/30/2019  . Radicular pain in right arm 03/30/2019  . Numbness of leg 03/30/2019  . Chest pain 07/24/2018  . Hypotension 07/17/2018  . Vertigo 07/17/2018  . Nausea 07/17/2018  . Vaccine counseling 05/21/2018  . Need for influenza vaccination 05/21/2018  . Routine general medical examination at a health care facility 05/21/2018  . Caregiver burden 07/05/2017  . Cyst of skin 07/05/2017  . Elevated blood pressure reading 06/28/2017  . Leg pain, diffuse, left 06/28/2017  . Leg swelling 06/28/2017  . Bruising 06/28/2017  . Myofascial muscle pain 06/28/2017  . High risk medication use 03/30/2017  . Chronic constipation 11/17/2016  . Globus sensation 11/17/2016  . Primary osteoarthritis of both knees 10/05/2016  . Autoimmune disease ([place]) 09/29/2016  . Primary osteoarthritis of both hands 09/29/2016  . Family history of lupus erythematosus 09/20/2016  . Polyarthralgia 05/06/2016  . Clenching of teeth 05/06/2016  . Finger swelling 05/06/2016  . Morning stiffness of joints 05/06/2016  . Myalgia 05/06/2016  . Family history of rheumatoid arthritis 05/06/2016  . Essential hypertension 12/23/2014  . History of anemia 12/23/2014  . Care provider for  parents 12/23/2014  . Post-operative state 11/13/2012  . Hyperlipidemia 11/11/2012  . Hypertension 11/11/2012  . Impaired fasting glucose 10/25/2011  . Gluten intolerance 10/25/2011  . Gastroesophageal reflux disease without esophagitis 10/25/2011  . Anxiety   . Iron deficiency anemia     Past Medical History:  Diagnosis Date  . Abnormal Pap smear 1988   treated with cryo  . Anemia 11/2010   hematology consult prior; etiology malabsorption and uterine bleeding  . Anxiety   . Gastric polyp   . GERD (gastroesophageal reflux disease)   . H/O bone density study 12/2007  . H/O hysterectomy for benign disease 5/14  . History of echocardiogram 03/2010    normal LV function, EF 60-65%, mild left atrial enlargement  . HTN (hypertension) 03/2010   hospitalization for HTN urgency  . Hyperlipemia   . Internal hemorrhoids   . Lumbar degenerative disc disease   . Migraine   . Myalgia   . Numbness and tingling   . Prediabetes   . Spondylosis, cervical   . Umbilical hernia   . Uterine fibroid    hx/o     Past Surgical History:  Procedure Laterality Date  . ABDOMINAL HYSTERECTOMY    . CERVICAL FUSION  2010  . CERVICAL SPINE SURGERY    . CERVIX LESION DESTRUCTION  1988  . CESAREAN SECTION    . CHOLECYSTECTOMY  2003  . COLONOSCOPY  08/2010   Dr. Collene Mares  . COLONOSCOPY  02/2017   Dr. Collene Mares  . ESOPHAGOGASTRODUODENOSCOPY  08/2010   Dr. Collene Mares  . EXPLORATORY LAPAROTOMY    . HERNIA REPAIR    . MYOMECTOMY  1998  . PELVIC LAPAROSCOPY    . ROBOTIC ASSISTED LAPAROSCOPIC HYSTERECTOMY AND SALPINGECTOMY  10/2012   [facility]; laproscopic due to fibroids  . UMBILICAL HERNIA REPAIR  10/2013   infected laparoscopic port, mesh placement    Current Outpatient Medications  Medication Sig Dispense Refill  . acetaminophen (TYLENOL) 500 MG tablet Take 1 tablet (500 mg total) by mouth every 6 (six) hours as needed. 30 tablet 0  . ALPRAZolam (XANAX) 1 MG tablet Take 1-2 tablets thirty minutes prior to MRI.  May take one additional tablet before entering scanner, if needed.  MUST HAVE DRIVER. (Patient taking differently: Take 1-2 tablets thirty minutes prior to MRI.  May take one additional tablet before entering scanner, if needed.  MUST HAVE DRIVER.) 3 tablet 0  . amLODipine (NORVASC) 5 MG tablet Take 1 tablet (5 mg total) by mouth daily. 90 tablet 3  . Ascorbic Acid (VITAMIN C) 1000 MG tablet Take 1,000 mg by mouth daily.    Marland Kitchen aspirin EC 81 MG tablet Take 81 mg by mouth as needed.    Marland Kitchen atorvastatin (LIPITOR) 20 MG tablet TAKE 1 TABLET BY MOUTH EVERY DAY 90 tablet 0  . B Complex Vitamins (B COMPLEX PO) Take 1 tablet by mouth daily.    . calcium carbonate (OS-CAL) 600  MG TABS Take 600 mg by mouth daily.    Marland Kitchen CINNAMON PO Take 1 tablet by mouth daily.     . diclofenac sodium (VOLTAREN) 1 % GEL 3 grams to 3 large joints up to 3 times daily 3 Tube 3  . escitalopram (LEXAPRO) 10 MG tablet Take 0.5 tablets (5 mg total) by mouth daily. 30 tablet 0  . fish oil-omega-3 fatty acids 1000 MG capsule Take 2 g by mouth daily.    . fluticasone (FLONASE) 50 MCG/ACT nasal spray Place 2 sprays into both nostrils  daily. 16 g 6  . hydroxychloroquine (PLAQUENIL) 200 MG tablet TAKE ONE TABLET BY MOUTH TWICE DAILY MONDAY- FRIDAY ONLY 120 tablet 0  . lubiprostone (AMITIZA) 24 MCG capsule Take 1 capsule (24 mcg total) by mouth 2 (two) times daily with a meal. 180 capsule 1  . Multiple Vitamin (MULTIVITAMIN) tablet Take 1 tablet by mouth daily.    Marland Kitchen NEXIUM 40 MG capsule Take 40 mg by mouth daily.     Marland Kitchen oxymetazoline (AFRIN NASAL SPRAY) 0.05 % nasal spray Place 1 spray into both nostrils 2 (two) times daily. 30 mL 0  . Probiotic Product (PROBIOTIC ADVANCED PO) Take 1 tablet by mouth daily.     . TURMERIC PO Take 1 tablet by mouth daily.      No current facility-administered medications for this visit.     ALLERGIES: Quinolones, Garlic, Latex, Levaquin [levofloxacin], Losartan, Metoprolol, Onion, Penicillins, Percocet [oxycodone-acetaminophen], Sulfa drugs cross reactors, Vicodin [hydrocodone-acetaminophen], and Gluten meal  Family History  Problem Relation Age of Onset  . Hypertension Mother   . Dementia Mother   . Colon cancer Mother 51  . Deep vein thrombosis Father   . Hypertension Father   . Cancer Father        prostate CA  . Heart disease Father 10       CAD  . Parkinsonism Father   . Hypertension Sister   . Hyperlipidemia Sister   . Sjogren's syndrome Sister   . Hypertension Brother   . Hyperlipidemia Brother   . Hypertension Maternal Aunt   . Hyperlipidemia Maternal Aunt   . Rheum arthritis Maternal Aunt   . Diabetes Maternal Aunt        1 with type II, 1  with type 1  . Hypertension Brother   . Rheum arthritis Brother   . Diabetes Paternal Aunt        type II  . Breast cancer Neg Hx     Social History   Socioeconomic History  . Marital status: Divorced    Spouse name: Not on file  . Number of children: 1  . Years of education: college  . Highest education level: Doctorate  Occupational History    Employer: [employer]    Comment: molecular Production designer, theatre/television/film, PhD candidate, [employer]  Tobacco Use  . Smoking status: Never Smoker  . Smokeless tobacco: Never Used  Vaping Use  . Vaping Use: Never used  Substance and Sexual Activity  . Alcohol use: No  . Drug use: No  . Sexual activity: Not Currently    Partners: Male    Birth control/protection: Surgical    Comment: hysterectomy  Other Topics Concern  . 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  Has 79 yo daughter.  Her siblings and her don't agree on helping take care of their parents, causes family tension.  As of 11/2014      Right-handed.   0.5 cup caffeine per day.   Her father lives with her.      Update 09/2019--father passed away from urosepsis in 2019-09-16   Social Determinants of Health   Financial Resource Strain: Not on file  Food Insecurity: Not on file  Transportation Needs: Not on file  Physical Activity: Not on file  Stress: Not on file  Social Connections: Not on file  Intimate Partner Violence: Not on file    Review of Systems  Genitourinary: Positive for dysuria.       Vaginal pain  All other systems reviewed and are negative.   PHYSICAL EXAMINATION:    BP 122/84   Pulse 64   Ht 5\' 4"  (1.626 m)   Wt 130 lb 6.4 oz (59.1 kg)   LMP 10/25/2012 (Approximate)   SpO2 98%   BMI 22.38 kg/m     General  appearance: alert, cooperative and appears stated age   Pelvic: External genitalia:  no lesions              Urethra:  normal appearing urethra with no masses, tenderness or lesions              Bartholins and Skenes: normal                 Vagina: mildly atrophic appearing vagina with normal color, no discharge, no lesions              Cervix: absent              Bimanual Exam:  Uterus:  uterus absent              Adnexa: no mass, fullness, tenderness               Chaperone was present for exam.  Wet prep: no yeast, no trich, no clue, no odor, few WBC PH: 5  Urinalysis: 0-5 WBC, 0-2 RBC, 0-5 squam   1. Vaginal burning Etiology not clear, atrophy is mild - WET PREP FOR TRICH, YEAST, CLUE: negative - SureSwab Bacterial Vaginosis/itis  2. Dysuria Urinalysis not very concerning - Urinalysis,Complete w/RFL Culture -Try azo -Call if her symptoms worsen prior to results being in  3. Vaginal atrophy If above testing is negative, will try vaginal estrogen.

## 2020-09-12 LAB — URINE CULTURE
MICRO NUMBER:: 11659741
SPECIMEN QUALITY:: ADEQUATE

## 2020-09-12 LAB — URINALYSIS, COMPLETE W/RFL CULTURE
Bilirubin Urine: NEGATIVE
Glucose, UA: NEGATIVE
Hyaline Cast: NONE SEEN /LPF
Ketones, ur: NEGATIVE
Leukocyte Esterase: NEGATIVE
Nitrites, Initial: NEGATIVE
Protein, ur: NEGATIVE
Specific Gravity, Urine: 1.02 (ref 1.001–1.03)
pH: 7 (ref 5.0–8.0)

## 2020-09-12 LAB — CULTURE INDICATED

## 2020-09-16 LAB — SURESWAB BACTERIAL VAGINOSIS/ITIS
Atopobium vaginae: NOT DETECTED Log (cells/mL)
C. albicans, DNA: NOT DETECTED
C. glabrata, DNA: NOT DETECTED
C. parapsilosis, DNA: NOT DETECTED
C. tropicalis, DNA: NOT DETECTED
Gardnerella vaginalis: NOT DETECTED Log (cells/mL)
LACTOBACILLUS SPECIES: 6.7 Log (cells/mL)
MEGASPHAERA SPECIES: NOT DETECTED Log (cells/mL)
Trichomonas vaginalis RNA: NOT DETECTED

## 2020-09-17 ENCOUNTER — Other Ambulatory Visit: Payer: Self-pay | Admitting: Obstetrics and Gynecology

## 2020-09-17 MED ORDER — ESTRADIOL 10 MCG VA TABS: ORAL_TABLET | VAGINAL | 0 refills | Status: DC

## 2020-10-08 ENCOUNTER — Encounter: Payer: Self-pay | Admitting: Family Medicine

## 2020-10-08 ENCOUNTER — Other Ambulatory Visit: Payer: Self-pay

## 2020-10-08 ENCOUNTER — Ambulatory Visit: Payer: BC Managed Care – PPO | Admitting: Family Medicine

## 2020-10-08 VITALS — BP 100/70 | HR 60 | Ht 64.0 in | Wt 125.4 lb

## 2020-10-08 DIAGNOSIS — Z23 Encounter for immunization: Secondary | ICD-10-CM

## 2020-10-08 DIAGNOSIS — M7989 Other specified soft tissue disorders: Secondary | ICD-10-CM

## 2020-10-08 NOTE — Progress Notes (Signed)
Chief Complaint  Patient presents with  . Shoulder Pain    Knot in front area of left shoulder x 2 days, painful.    Noticed a lump on the front of her left shoulder. She noticed it after a shower 2 days ago, saw it when she moved her arm back. It is a little tender to touch.  She has some discomfort going down the front of the left arm, mainly over L bicep.  She thinks this may be related to activity--she had cut her grass, done some yardwork the day before. No numbness/tingling, no weakness No treatment (no icing or OTC meds), hasn't needed tylenol.  No itching, no bite, no trauma.  She reports having L arm pain related to helping care for dad in the past, this is different.   PMH ,PSH, SH reviewed  Outpatient Encounter Medications as of 10/08/2020  Medication Sig  . ALPRAZolam (XANAX) 0.5 MG tablet Take 0.5 mg by mouth as needed for anxiety.  Marland Kitchen amLODipine (NORVASC) 5 MG tablet Take 1 tablet (5 mg total) by mouth daily.  . Ascorbic Acid (VITAMIN C) 1000 MG tablet Take 1,000 mg by mouth daily.  Marland Kitchen aspirin EC 81 MG tablet Take 81 mg by mouth as needed.  Marland Kitchen atorvastatin (LIPITOR) 20 MG tablet TAKE 1 TABLET BY MOUTH EVERY DAY  . B Complex Vitamins (B COMPLEX PO) Take 1 tablet by mouth daily.  . calcium carbonate (OS-CAL) 600 MG TABS Take 600 mg by mouth daily.  Marland Kitchen CINNAMON PO Take 1 tablet by mouth daily.   Marland Kitchen escitalopram (LEXAPRO) 10 MG tablet Take 0.5 tablets (5 mg total) by mouth daily.  Marland Kitchen estradiol (VIVELLE-DOT) 0.025 MG/24HR Place 1 patch onto the skin 2 (two) times a week.  . fish oil-omega-3 fatty acids 1000 MG capsule Take 1 g by mouth daily.  . fluticasone (FLONASE) 50 MCG/ACT nasal spray Place 2 sprays into both nostrils daily.  . hydroxychloroquine (PLAQUENIL) 200 MG tablet TAKE ONE TABLET BY MOUTH TWICE DAILY MONDAY- FRIDAY ONLY  . lubiprostone (AMITIZA) 24 MCG capsule Take 1 capsule (24 mcg total) by mouth 2 (two) times daily with a meal.  . Multiple Vitamin (MULTIVITAMIN)  tablet Take 1 tablet by mouth daily.  Marland Kitchen NEXIUM 40 MG capsule Take 40 mg by mouth daily.   . Probiotic Product (PROBIOTIC ADVANCED PO) Take 1 tablet by mouth daily.   . TURMERIC PO Take 1 tablet by mouth daily.   Marland Kitchen acetaminophen (TYLENOL) 500 MG tablet Take 1 tablet (500 mg total) by mouth every 6 (six) hours as needed. (Patient not taking: Reported on 10/08/2020)  . diclofenac sodium (VOLTAREN) 1 % GEL 3 grams to 3 large joints up to 3 times daily (Patient not taking: Reported on 10/08/2020)  . Estradiol 10 MCG TABS vaginal tablet Place one tablet vaginally qhs x 1 week, then change to 2 x a week. (Patient not taking: Reported on 10/08/2020)  . [DISCONTINUED] ALPRAZolam (XANAX) 1 MG tablet Take 1-2 tablets thirty minutes prior to MRI.  May take one additional tablet before entering scanner, if needed.  MUST HAVE DRIVER. (Patient taking differently: Take 1-2 tablets thirty minutes prior to MRI.  May take one additional tablet before entering scanner, if needed.  MUST HAVE DRIVER.)  . [DISCONTINUED] oxymetazoline (AFRIN NASAL SPRAY) 0.05 % nasal spray Place 1 spray into both nostrils 2 (two) times daily. (Patient not taking: Reported on 10/08/2020)   No facility-administered encounter medications on file as of 10/08/2020.   Allergies  Allergen Reactions  . Quinolones Anaphylaxis and Other (See Comments)    Whelps developed all over her body  . Garlic   . Latex Itching and Other (See Comments)    Skin yellowing  . Levaquin [Levofloxacin] Hives    Throat swelling  . Losartan     Intolerance, fluctuating BPs  . Metoprolol     Self reported hypotension  . Onion   . Penicillins   . Percocet [Oxycodone-Acetaminophen]   . Sulfa Drugs Cross Reactors Itching, Swelling and Other (See Comments)    Red face  . Vicodin [Hydrocodone-Acetaminophen] Itching and Nausea And Vomiting  . Gluten Meal     Other reaction(s): Other (See Comments) IBS   ROS: no fever, chills, URI symptoms, currently without  headache, dizziness, chest pain, shortness of breath. No bleeding, bruising, other skin concerns.  She has h/o lipoma.   PHYSICAL EXAM:  BP 100/70   Pulse 60   Ht 5\' 4"  (1.626 m)   Wt 125 lb 6.4 oz (56.9 kg)   LMP 10/25/2012 (Approximate)   BMI 21.52 kg/m   Pleasant, well-appearing female, in good spirits, in no distress L shoulder: Soft tissue mass at left anterior shoulder.  Focal, firm, about 1 cm (possibly slightly larger).  Mildly tender. Has consistency of LN (though not in a typical location) vs ganglion (unusual location)--firmer than lipoma. No axillary lymphadenopathy Slightly tender over L bicep muscle Normal pulses, strength, sensation and DTRs  ASSESSMENT/PLAN:  Mass of soft tissue of shoulder - left. Hx of acute onset, with arm soreness from physical activity.  Reassured, observation. f/u if persists, enlarges/changes. Ddx reviewed  Need for COVID-19 vaccine - Plan: PFIZER Comirnaty(GRAY TOP)COVID-19 Vaccine    While I can't tell you exactly what the lump is, I do not think that it is anything worrisome at this point. Take some advil or aleve for the muscle pain (related to yardwork). Try and not mess with or focus on the lump too much. If it is rapidly increasing in size, we need to do further evaluation. It may stay there for a while, but no longer be sore or bothersome. Have it rechecked periodically, especially if changing.  We discussed a lot of potential causes (ganglion cyst, other cysts, lymph nodes, cancer, lipoma, etc).  Time should help tell if we need to do more.  Based on the size and consistency, I do not think it is cancer or anything that needs to be evaluated or removed right away.

## 2020-10-08 NOTE — Patient Instructions (Signed)
While I can't tell you exactly what the lump is, I do not think that it is anything worrisome at this point. Take some advil or aleve for the muscle pain (related to yardwork). Try and not mess with or focus on the lump too much. If it is rapidly increasing in size, we need to do further evaluation. It may stay there for a while, but no longer be sore or bothersome. Have it rechecked periodically, especially if changing.  We discussed a lot of potential causes (ganglion cyst, other cysts, lymph nodes, cancer, lipoma, etc).  Time should help tell if we need to do more.  Based on the size and consistency, I do not think it is cancer or anything that needs to be evaluated or removed right away.

## 2020-10-17 ENCOUNTER — Other Ambulatory Visit: Payer: Self-pay | Admitting: Medical

## 2020-10-20 ENCOUNTER — Other Ambulatory Visit: Payer: Self-pay | Admitting: Medical

## 2020-10-27 NOTE — Progress Notes (Deleted)
Office Visit Note  Patient: Shari Lawrence             Date of Birth: 1960/12/24           MRN: [id number]             PCP: Carlena Hurl, PA-C Referring: Carlena Hurl, PA-C Visit Date: 11/10/2020 Occupation: @GUAROCC @  Subjective:  No chief complaint on file.   History of Present Illness: Shari Lawrence is a 61 y.o. female ***   Activities of Daily Living:  Patient reports morning stiffness for *** {minute/hour:19697}.   Patient {ACTIONS;DENIES/REPORTS:21021675::"Denies"} nocturnal pain.  Difficulty dressing/grooming: {ACTIONS;DENIES/REPORTS:21021675::"Denies"} Difficulty climbing stairs: {ACTIONS;DENIES/REPORTS:21021675::"Denies"} Difficulty getting out of chair: {ACTIONS;DENIES/REPORTS:21021675::"Denies"} Difficulty using hands for taps, buttons, cutlery, and/or writing: {ACTIONS;DENIES/REPORTS:21021675::"Denies"}  No Rheumatology ROS completed.   PMFS History:  Patient Active Problem List   Diagnosis Date Noted  . Chronic right-sided thoracic back pain 07/28/2020  . Cervicalgia 07/28/2020  . Prediabetes   . Facial numbness 05/04/2020  . Leg numbness 05/04/2020  . Knee swelling 05/04/2020  . Facial pain 05/04/2020  . Ear pain, left 05/04/2020  . History of recent stressful life event 11/20/2019  . Panic attack 11/15/2019  . Fall 11/13/2019  . Pain in both hands 11/13/2019  . Sleep disturbance 11/13/2019  . Grief 11/13/2019  . Bilateral carpal tunnel syndrome 08/21/2019  . Chronic pain of both knees 06/14/2019  . Right arm pain 06/14/2019  . Close exposure to COVID-19 virus 06/14/2019  . Paresthesia 04/08/2019  . Neck pain 03/30/2019  . Radicular pain in right arm 03/30/2019  . Numbness of leg 03/30/2019  . Chest pain 07/24/2018  . Hypotension 07/17/2018  . Vertigo 07/17/2018  . Nausea 07/17/2018  . Vaccine counseling 05/21/2018  . Need for influenza vaccination 05/21/2018  . Routine general medical examination at a health care  facility 05/21/2018  . Caregiver burden 07/05/2017  . Cyst of skin 07/05/2017  . Elevated blood pressure reading 06/28/2017  . Leg pain, diffuse, left 06/28/2017  . Leg swelling 06/28/2017  . Bruising 06/28/2017  . Myofascial muscle pain 06/28/2017  . High risk medication use 03/30/2017  . Chronic constipation 11/17/2016  . Globus sensation 11/17/2016  . Primary osteoarthritis of both knees 10/05/2016  . Autoimmune disease ([place]) 09/29/2016  . Primary osteoarthritis of both hands 09/29/2016  . Family history of lupus erythematosus 09/20/2016  . Polyarthralgia 05/06/2016  . Clenching of teeth 05/06/2016  . Finger swelling 05/06/2016  . Morning stiffness of joints 05/06/2016  . Myalgia 05/06/2016  . Family history of rheumatoid arthritis 05/06/2016  . Essential hypertension 12/23/2014  . History of anemia 12/23/2014  . Care provider for parents 12/23/2014  . Post-operative state 11/13/2012  . Hyperlipidemia 11/11/2012  . Hypertension 11/11/2012  . Impaired fasting glucose 10/25/2011  . Gluten intolerance 10/25/2011  . Gastroesophageal reflux disease without esophagitis 10/25/2011  . Anxiety   . Iron deficiency anemia     Past Medical History:  Diagnosis Date  . Abnormal Pap smear 1988   treated with cryo  . Anemia 11/2010   hematology consult prior; etiology malabsorption and uterine bleeding  . Anxiety   . Gastric polyp   . GERD (gastroesophageal reflux disease)   . H/O bone density study 12/2007  . H/O hysterectomy for benign disease 5/14  . History of echocardiogram 03/2010   normal LV function, EF 60-65%, mild left atrial enlargement  . HTN (hypertension) 03/2010   hospitalization for HTN urgency  . Hyperlipemia   .  Internal hemorrhoids   . Lumbar degenerative disc disease   . Migraine   . Myalgia   . Numbness and tingling   . Prediabetes   . Spondylosis, cervical   . Umbilical hernia   . Uterine fibroid    hx/o     Family History  Problem Relation Age of  Onset  . Hypertension Mother   . Dementia Mother   . Colon cancer Mother 57  . Deep vein thrombosis Father   . Hypertension Father   . Cancer Father        prostate CA  . Heart disease Father 34       CAD  . Parkinsonism Father   . Hypertension Sister   . Hyperlipidemia Sister   . Sjogren's syndrome Sister   . Hypertension Brother   . Hyperlipidemia Brother   . Hypertension Maternal Aunt   . Hyperlipidemia Maternal Aunt   . Rheum arthritis Maternal Aunt   . Diabetes Maternal Aunt        1 with type II, 1 with type 1  . Hypertension Brother   . Rheum arthritis Brother   . Diabetes Paternal Aunt        type II  . Breast cancer Neg Hx    Past Surgical History:  Procedure Laterality Date  . ABDOMINAL HYSTERECTOMY    . CERVICAL FUSION  2010  . CERVICAL SPINE SURGERY    . CERVIX LESION DESTRUCTION  1988  . CESAREAN SECTION    . CHOLECYSTECTOMY  2003  . COLONOSCOPY  09-15-10   Dr. Collene Mares  . COLONOSCOPY  02/2017   Dr. Collene Mares  . ESOPHAGOGASTRODUODENOSCOPY  2010/09/15   Dr. Collene Mares  . EXPLORATORY LAPAROTOMY    . HERNIA REPAIR    . MYOMECTOMY  1998  . PELVIC LAPAROSCOPY    . ROBOTIC ASSISTED LAPAROSCOPIC HYSTERECTOMY AND SALPINGECTOMY  10/2012   [facility]; laproscopic due to fibroids  . 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  Has 54 yo daughter.  Her siblings and her don't agree on helping take care of their parents, causes family tension.  As of 11/2014      Right-handed.   0.5 cup caffeine per day.   Her father lives with her.      Update 09/2019--father passed away from urosepsis in 2019-09-15   Immunization History  Administered Date(s) Administered  .  Influenza,inj,Quad PF,6+ Mos 05/01/2013, 03/31/2014, 05/17/2017, 05/21/2018, 03/29/2019, 04/10/2020  . PFIZER Comirnaty(Gray Top)Covid-19 Tri-Sucrose Vaccine 10/08/2020  . PFIZER(Purple Top)SARS-COV-2 Vaccination 08/24/2019, 09/17/2019, 03/30/2020  . Td 02/04/2012  . Zoster Recombinat (Shingrix) 09/15/2020     Objective: Vital Signs: LMP 10/25/2012 (Approximate)    Physical Exam   Musculoskeletal Exam: ***  CDAI Exam: CDAI Score: -- Patient Global: --; Provider Global: -- Swollen: --; Tender: -- Joint Exam 11/10/2020   No joint exam has been documented for this visit   There is currently no information documented on the homunculus. Go to the Rheumatology activity and complete the homunculus joint exam.  Investigation: No additional findings.  Imaging: No results found.  Recent Labs: Lab Results  Component Value Date   WBC 5.1 04/16/2020   HGB 12.6 04/16/2020   PLT 185  04/16/2020   NA 141 04/16/2020   K 3.8 04/16/2020   CL 105 04/16/2020   CO2 23 04/16/2020   GLUCOSE 97 04/16/2020   BUN 10 04/16/2020   CREATININE 0.86 04/16/2020   BILITOT 0.6 04/16/2020   ALKPHOS 72 04/16/2020   AST 34 04/16/2020   ALT 32 04/16/2020   PROT 7.2 04/16/2020   ALBUMIN 4.4 04/16/2020   CALCIUM 9.8 04/16/2020   GFRAA 80 03/19/2020    Speciality Comments: PLQ Eye Exam: 06/03/2020 WNL @ [facility]. Follow up in 1 year.  Procedures:  No procedures performed Allergies: Quinolones, Garlic, Latex, Levaquin [levofloxacin], Losartan, Metoprolol, Onion, Penicillins, Percocet [oxycodone-acetaminophen], Sulfa drugs cross reactors, Vicodin [hydrocodone-acetaminophen], and Gluten meal   Assessment / Plan:     Visit Diagnoses: No diagnosis found.  Orders: No orders of the defined types were placed in this encounter.  No orders of the defined types were placed in this encounter.   Face-to-face time spent with patient was *** minutes. Greater than 50% of time was spent in counseling  and coordination of care.  Follow-Up Instructions: No follow-ups on file.   Earnestine Mealing, CMA  Note - This record has been created using Editor, commissioning.  Chart creation errors have been sought, but may not always  have been located. Such creation errors do not reflect on  the standard of medical care.

## 2020-10-28 ENCOUNTER — Telehealth: Payer: Self-pay | Admitting: *Deleted

## 2020-10-28 DIAGNOSIS — U071 COVID-19: Secondary | ICD-10-CM

## 2020-10-28 HISTORY — DX: COVID-19: U07.1

## 2020-10-28 NOTE — Telephone Encounter (Signed)
Patient called and was around her brothers family Sunday which have tested positive for covid and now she has a scratchy throat. She did at home test today and it was neg, she has a virtual scheduled with Vickie for tomorrow but called and asked if you would call in the anti-viral drug for her today, preventatively since she is immuno-suppressed. I told her I was unsure if that was possible until she was seen tomorrow and the provider made that determination-she wanted a message sent to you regardless.

## 2020-10-29 ENCOUNTER — Telehealth: Payer: BC Managed Care – PPO | Admitting: Family Medicine

## 2020-10-29 ENCOUNTER — Other Ambulatory Visit: Payer: Self-pay

## 2020-10-29 ENCOUNTER — Encounter: Payer: Self-pay | Admitting: Family Medicine

## 2020-10-29 VITALS — BP 116/71 | HR 68 | Wt 127.2 lb

## 2020-10-29 DIAGNOSIS — R059 Cough, unspecified: Secondary | ICD-10-CM

## 2020-10-29 DIAGNOSIS — J029 Acute pharyngitis, unspecified: Secondary | ICD-10-CM

## 2020-10-29 DIAGNOSIS — M359 Systemic involvement of connective tissue, unspecified: Secondary | ICD-10-CM | POA: Diagnosis not present

## 2020-10-29 DIAGNOSIS — I10 Essential (primary) hypertension: Secondary | ICD-10-CM

## 2020-10-29 DIAGNOSIS — E785 Hyperlipidemia, unspecified: Secondary | ICD-10-CM

## 2020-10-29 DIAGNOSIS — R7303 Prediabetes: Secondary | ICD-10-CM

## 2020-10-29 DIAGNOSIS — U071 COVID-19: Secondary | ICD-10-CM

## 2020-10-29 NOTE — Telephone Encounter (Signed)
Shari Lawrence is seen this message.  By the time I saw this message it was already this morning /Thursday and you will be seeing her today

## 2020-10-29 NOTE — Progress Notes (Signed)
   Subjective:  Documentation for virtual audio and video telecommunications through [place] encounter:  The patient was located at home. 2 patient identifiers used.  The provider was located in the office. The patient did consent to this visit and is aware of possible charges through their insurance for this visit.  The other persons participating in this telemedicine service were none. Time spent on call was 16 minutes and in review of previous records >20 minutes total.  This virtual service is not related to other E/M service within previous 7 days.   Patient ID: Shari Lawrence, female    DOB: Apr 06, 1961, 60 y.o.   MRN: [id number]  HPI Chief Complaint  Patient presents with  . scratchy throat    Scratchy throat, drainage, started yesterday, some chest pain when cough. Brother tested positive Monday, but she had mask on Sunday at church. Negative- covid test yesterday.    Complains of a 24 hour history of sore throat, post nasal drainage, cough and chest pain with coughing.  States her cough is not as bad today.   She is taking Tylenol and using a multi-symptom cold medication.  Using Flonase and saline nasal spray.   States she had an exposure to Covid 4 days ago but was wearing a mask.   States she had a negative home Covid test yesterday but today it was positive.   States she received her 2nd booster 2 to 3 weeks ago.  She has an autoimmune disorder, prediabetes, hypertension hyperlipidemia as well as other chronic health conditions.   Review of Systems Pertinent positives and negatives in the history of present illness.     Objective:   Physical Exam BP 116/71   Pulse 68   Wt 127 lb 3.2 oz (57.7 kg)   LMP 10/25/2012 (Approximate)   BMI 21.83 kg/m   Oriented in no acute distress.  Speaking in complete sentences without difficulty.  Respirations unlabored.      Assessment & Plan:  COVID-19 virus infection - Plan: Ambulatory referral for Covid  Treatment  Acute pharyngitis, unspecified etiology  Cough  Autoimmune disease ([place])  Essential hypertension  Hyperlipidemia, unspecified hyperlipidemia type  Prediabetes  Symptom onset 24 to 48 hours ago.  Positive COVID test today.  She does have several chronic underlying health conditions including autoimmune disorder, hypertension, hyperlipidemia, prediabetes and I am referring her to the [facility] treatment clinic to determine if she needs IV versus oral treatment.  She will continue with symptomatic treatment in the meantime.

## 2020-10-30 ENCOUNTER — Telehealth: Payer: Self-pay | Admitting: Family

## 2020-10-30 ENCOUNTER — Other Ambulatory Visit: Payer: Self-pay | Admitting: Family Medicine

## 2020-10-30 ENCOUNTER — Telehealth: Payer: Self-pay | Admitting: Family Medicine

## 2020-10-30 DIAGNOSIS — U071 COVID-19: Secondary | ICD-10-CM

## 2020-10-30 MED ORDER — PAXLOVID 20 X 150 MG & 10 X 100MG PO TBPK
3.0000 | ORAL_TABLET | Freq: Two times a day (BID) | ORAL | 0 refills | Status: DC
Start: 2020-10-30 — End: 2021-01-05

## 2020-10-30 MED ORDER — BENZONATATE 200 MG PO CAPS: 200.0000 mg | ORAL_CAPSULE | Freq: Two times a day (BID) | ORAL | 0 refills | Status: DC | PRN

## 2020-10-30 NOTE — Telephone Encounter (Signed)
Pt was notified of results

## 2020-10-30 NOTE — Telephone Encounter (Signed)
Pt called and stated that she has not heard from the infusion clinic and does not want to miss the chance to go. She stated that if they are unavailable today she would like to try the anti viral

## 2020-10-30 NOTE — Telephone Encounter (Signed)
Chart reviewed as part of COVID 19 treatment team. COVID-19 symptoms and the use of one of the available treatments for those with mild to moderate Covid symptoms and at a high risk of hospitalization.  Pt appears to qualify for outpatient treatment due to co-morbid conditions and/or a member of an at-risk group in accordance with the FDA Emergency Use Authorization.    She is fully vaccinated and has received booster x2. She does not qualify for monoclonal antibody therapy.   PCP has already sent in Paxlovid at this time which is appropriate treatment regimen.   Shari Lawrence

## 2020-10-30 NOTE — Telephone Encounter (Signed)
Pt has not heard back from infusion clinic so she would like medication called in. She would also like something for cough. She states she is coughing real bad.

## 2020-10-30 NOTE — Telephone Encounter (Signed)
See msg, you saw her yesterday

## 2020-10-30 NOTE — Telephone Encounter (Signed)
Pt has not heard from covid treatment yet so please call in med to [facility]. Pt would also like something for her cough.

## 2020-10-30 NOTE — Telephone Encounter (Signed)
I sent in the Paxlovid (oral antiviral) for Covid as well as Tessalon Perles for her cough

## 2020-10-30 NOTE — Telephone Encounter (Signed)
I agree, if she does not get in to see them today (doesn't hear from them by 3 PM) I will send in the medication. Let me know.

## 2020-10-30 NOTE — Telephone Encounter (Signed)
Pt said she will call back and let you know

## 2020-11-02 ENCOUNTER — Ambulatory Visit: Payer: BC Managed Care – PPO

## 2020-11-03 ENCOUNTER — Ambulatory Visit: Payer: BC Managed Care – PPO | Admitting: Neurology

## 2020-11-10 ENCOUNTER — Ambulatory Visit: Payer: BC Managed Care – PPO | Admitting: Rheumatology

## 2020-11-10 DIAGNOSIS — Z862 Personal history of diseases of the blood and blood-forming organs and certain disorders involving the immune mechanism: Secondary | ICD-10-CM

## 2020-11-10 DIAGNOSIS — M7918 Myalgia, other site: Secondary | ICD-10-CM

## 2020-11-10 DIAGNOSIS — Z8719 Personal history of other diseases of the digestive system: Secondary | ICD-10-CM

## 2020-11-10 DIAGNOSIS — Z79899 Other long term (current) drug therapy: Secondary | ICD-10-CM

## 2020-11-10 DIAGNOSIS — Z8679 Personal history of other diseases of the circulatory system: Secondary | ICD-10-CM

## 2020-11-10 DIAGNOSIS — M25522 Pain in left elbow: Secondary | ICD-10-CM

## 2020-11-10 DIAGNOSIS — M19041 Primary osteoarthritis, right hand: Secondary | ICD-10-CM

## 2020-11-10 DIAGNOSIS — Z8261 Family history of arthritis: Secondary | ICD-10-CM

## 2020-11-10 DIAGNOSIS — Z8659 Personal history of other mental and behavioral disorders: Secondary | ICD-10-CM

## 2020-11-10 DIAGNOSIS — Z8639 Personal history of other endocrine, nutritional and metabolic disease: Secondary | ICD-10-CM

## 2020-11-10 DIAGNOSIS — M7061 Trochanteric bursitis, right hip: Secondary | ICD-10-CM

## 2020-11-10 DIAGNOSIS — M25521 Pain in right elbow: Secondary | ICD-10-CM

## 2020-11-10 DIAGNOSIS — M170 Bilateral primary osteoarthritis of knee: Secondary | ICD-10-CM

## 2020-11-10 DIAGNOSIS — M3500 Sicca syndrome, unspecified: Secondary | ICD-10-CM

## 2020-11-10 DIAGNOSIS — M359 Systemic involvement of connective tissue, unspecified: Secondary | ICD-10-CM

## 2020-11-10 DIAGNOSIS — M7062 Trochanteric bursitis, left hip: Secondary | ICD-10-CM

## 2020-11-10 DIAGNOSIS — Z840 Family history of diseases of the skin and subcutaneous tissue: Secondary | ICD-10-CM

## 2020-11-16 NOTE — Progress Notes (Signed)
Office Visit Note  Patient: Shari Lawrence             Date of Birth: 1961-01-16           MRN: [id number]             PCP: Carlena Hurl, PA-C Referring: Carlena Hurl, PA-C Visit Date: 11/19/2020 Occupation: _0 @  Subjective:  Medication monitoring  History of Present Illness: Shari Lawrence is a 60 y.o. female with history of autoimmune disease, osteoarthritis, myofascial pain.  Patient is currently taking Plaquenil 200 mg 1 tablet by mouth twice daily Monday through Friday.  She is tolerating Plaquenil without any side effects.  She denies any signs or symptoms of a flare recently.  She states that overall she has been feeling better over the past several weeks.  She continues to have intermittent pain in both hands but denies any joint swelling.  She has ongoing neck and lower back pain and stiffness but does not have any symptoms of radiculopathy.  She states that her symptoms of Raynaud's have been less frequent.  She denies any oral or nasal ulcerations.  She continues to have chronic sicca symptoms.  She recently purchased Biotene products which she plans on starting to use for symptomatic relief.  She denies any recent facial rashes but did have a rash on the left side of her neck recently.  She denies any other new concerns.     Activities of Daily Living:  Patient reports morning stiffness for 0 minutes.   Patient Reports nocturnal pain.  Difficulty dressing/grooming: Denies Difficulty climbing stairs: Reports Difficulty getting out of chair: Reports Difficulty using hands for taps, buttons, cutlery, and/or writing: Reports  Review of Systems  Constitutional: Positive for fatigue.  HENT: Positive for mouth dryness. Negative for mouth sores and nose dryness.   Eyes: Positive for pain and dryness. Negative for itching and visual disturbance.  Respiratory: Negative for cough, hemoptysis, shortness of breath and difficulty breathing.    Cardiovascular: Positive for swelling in legs/feet. Negative for chest pain and palpitations.  Gastrointestinal: Positive for abdominal pain and constipation. Negative for blood in stool and diarrhea.  Endocrine: Negative for increased urination.  Genitourinary: Negative for painful urination.  Musculoskeletal: Positive for arthralgias, joint pain, myalgias, muscle weakness, muscle tenderness and myalgias. Negative for joint swelling and morning stiffness.  Skin: Positive for rash. Negative for color change and redness.  Allergic/Immunologic: Negative for susceptible to infections.  Neurological: Positive for dizziness and numbness. Negative for headaches, memory loss and weakness.  Hematological: Negative for swollen glands.  Psychiatric/Behavioral: Positive for sleep disturbance. Negative for confusion.    PMFS History:  Patient Active Problem List   Diagnosis Date Noted  . Chronic right-sided thoracic back pain 07/28/2020  . Cervicalgia 07/28/2020  . Prediabetes   . Facial numbness 05/04/2020  . Leg numbness 05/04/2020  . Knee swelling 05/04/2020  . Facial pain 05/04/2020  . Ear pain, left 05/04/2020  . History of recent stressful life event 11/20/2019  . Panic attack 11/15/2019  . Fall 11/13/2019  . Pain in both hands 11/13/2019  . Sleep disturbance 11/13/2019  . Grief 11/13/2019  . Bilateral carpal tunnel syndrome 08/21/2019  . Chronic pain of both knees 06/14/2019  . Right arm pain 06/14/2019  . Close exposure to COVID-19 virus 06/14/2019  . Paresthesia 04/08/2019  . Neck pain 03/30/2019  . Radicular pain in right arm 03/30/2019  . Numbness of leg 03/30/2019  . Chest pain 07/24/2018  .  Hypotension 07/17/2018  . Vertigo 07/17/2018  . Nausea 07/17/2018  . Vaccine counseling 05/21/2018  . Need for influenza vaccination 05/21/2018  . Routine general medical examination at a health care facility 05/21/2018  . Caregiver burden 07/05/2017  . Cyst of skin 07/05/2017  .  Elevated blood pressure reading 06/28/2017  . Leg pain, diffuse, left 06/28/2017  . Leg swelling 06/28/2017  . Bruising 06/28/2017  . Myofascial muscle pain 06/28/2017  . High risk medication use 03/30/2017  . Chronic constipation 11/17/2016  . Globus sensation 11/17/2016  . Primary osteoarthritis of both knees 10/05/2016  . Autoimmune disease ([place]) 09/29/2016  . Primary osteoarthritis of both hands 09/29/2016  . Family history of lupus erythematosus 09/20/2016  . Polyarthralgia 05/06/2016  . Clenching of teeth 05/06/2016  . Finger swelling 05/06/2016  . Morning stiffness of joints 05/06/2016  . Myalgia 05/06/2016  . Family history of rheumatoid arthritis 05/06/2016  . Essential hypertension 12/23/2014  . History of anemia 12/23/2014  . Care provider for parents 12/23/2014  . Post-operative state 11/13/2012  . Hyperlipidemia 11/11/2012  . Hypertension 11/11/2012  . Impaired fasting glucose 10/25/2011  . Gluten intolerance 10/25/2011  . Gastroesophageal reflux disease without esophagitis 10/25/2011  . Anxiety   . Iron deficiency anemia     Past Medical History:  Diagnosis Date  . Abnormal Pap smear 1988   treated with cryo  . Anemia 11/2010   hematology consult prior; etiology malabsorption and uterine bleeding  . Anxiety   . COVID-19 10/28/2020  . Gastric polyp   . GERD (gastroesophageal reflux disease)   . H/O bone density study 12/2007  . H/O hysterectomy for benign disease 5/14  . History of echocardiogram 03/2010   normal LV function, EF 60-65%, mild left atrial enlargement  . HTN (hypertension) 03/2010   hospitalization for HTN urgency  . Hyperlipemia   . Internal hemorrhoids   . Lumbar degenerative disc disease   . Migraine   . Myalgia   . Numbness and tingling   . Prediabetes   . Spondylosis, cervical   . Umbilical hernia   . Uterine fibroid    hx/o     Family History  Problem Relation Age of Onset  . Hypertension Mother   . Dementia Mother   . Colon  cancer Mother 77  . Deep vein thrombosis Father   . Hypertension Father   . Cancer Father        prostate CA  . Heart disease Father 61       CAD  . Parkinsonism Father   . Hypertension Sister   . Hyperlipidemia Sister   . Sjogren's syndrome Sister   . Hypertension Brother   . Hyperlipidemia Brother   . Hypertension Maternal Aunt   . Hyperlipidemia Maternal Aunt   . Rheum arthritis Maternal Aunt   . Diabetes Maternal Aunt        1 with type II, 1 with type 1  . Hypertension Brother   . Rheum arthritis Brother   . Diabetes Paternal Aunt        type II  . Breast cancer Neg Hx    Past Surgical History:  Procedure Laterality Date  . ABDOMINAL HYSTERECTOMY    . CERVICAL FUSION  2010  . CERVICAL SPINE SURGERY    . CERVIX LESION DESTRUCTION  1988  . CESAREAN SECTION    . CHOLECYSTECTOMY  2003  . COLONOSCOPY  08/2010   Dr. Collene Mares  . COLONOSCOPY  02/2017   Dr. Collene Mares  .  ESOPHAGOGASTRODUODENOSCOPY  September 15, 2010   Dr. Collene Mares  . EXPLORATORY LAPAROTOMY    . HERNIA REPAIR    . MYOMECTOMY  1998  . PELVIC LAPAROSCOPY    . ROBOTIC ASSISTED LAPAROSCOPIC HYSTERECTOMY AND SALPINGECTOMY  10/2012   [facility]; laproscopic due to fibroids  . 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  Has 65 yo daughter.  Her siblings and her don't agree on helping take care of their parents, causes family tension.  As of 11/2014      Right-handed.   0.5 cup caffeine per day.   Her father lives with her.      Update 09/2019--father passed away from urosepsis in 15-Sep-2019   Immunization History  Administered Date(s) Administered  . Influenza,inj,Quad PF,6+ Mos 05/01/2013, 03/31/2014, 05/17/2017, 05/21/2018,  03/29/2019, 04/10/2020  . PFIZER Comirnaty(Gray Top)Covid-19 Tri-Sucrose Vaccine 10/08/2020  . PFIZER(Purple Top)SARS-COV-2 Vaccination 08/24/2019, 09/17/2019, 03/30/2020  . Td 02/04/2012  . Zoster Recombinat (Shingrix) 09/15/2020     Objective: Vital Signs: BP 117/82 (BP Location: Left Arm, Patient Position: Sitting, Cuff Size: Normal)   Pulse 63   Ht _0  (1.626 m)   Wt 130 lb 3.2 oz (59.1 kg)   LMP 10/25/2012 (Approximate)   BMI 22.35 kg/m    Physical Exam Vitals and nursing note reviewed.  Constitutional:      Appearance: She is well-developed.  HENT:     Head: Normocephalic and atraumatic.  Eyes:     Conjunctiva/sclera: Conjunctivae normal.  Pulmonary:     Effort: Pulmonary effort is normal.  Abdominal:     Palpations: Abdomen is soft.  Musculoskeletal:     Cervical back: Normal range of motion.  Skin:    General: Skin is warm and dry.     Capillary Refill: Capillary refill takes less than 2 seconds.  Neurological:     Mental Status: She is alert and oriented to person, place, and time.  Psychiatric:        Behavior: Behavior normal.      Musculoskeletal Exam: C-spine is slightly limited range of motion with lateral rotation.  Shoulder joints, elbow joints, wrist joints, MCPs, PIPs, DIPs have good range of motion with no synovitis.  She was able to make a complete fist bilaterally.  Hip joints have good range of motion with no discomfort.  Knee joints have good range of motion with no warmth or effusion.  She has bilateral knee crepitus.  Ankle joints have good range of motion with no joint tenderness.  Bunions noted bilaterally.  No tenderness over MTP joints.  No evidence of Achilles tendinitis or plantar fasciitis.  CDAI Exam: CDAI Score: -- Patient Global: --; Provider Global: -- Swollen: --; Tender: -- Joint Exam 11/19/2020   No joint exam has been documented for this visit   There is currently no information documented on the homunculus. Go to the  Rheumatology activity and complete the homunculus joint exam.  Investigation: No additional findings.  Imaging: No results found.  Recent Labs: Lab Results  Component Value Date   WBC 5.1 04/16/2020   HGB 12.6 04/16/2020   PLT 185 04/16/2020   NA 141 04/16/2020   K 3.8 04/16/2020   CL 105 04/16/2020  CO2 23 04/16/2020   GLUCOSE 97 04/16/2020   BUN 10 04/16/2020   CREATININE 0.86 04/16/2020   BILITOT 0.6 04/16/2020   ALKPHOS 72 04/16/2020   AST 34 04/16/2020   ALT 32 04/16/2020   PROT 7.2 04/16/2020   ALBUMIN 4.4 04/16/2020   CALCIUM 9.8 04/16/2020   GFRAA 80 03/19/2020    Speciality Comments: PLQ Eye Exam: 06/03/2020 WNL @ [facility]. Follow up in 1 year.  Procedures:  No procedures performed Allergies: Quinolones, Garlic, Latex, Levaquin [levofloxacin], Losartan, Metoprolol, Onion, Penicillins, Percocet [oxycodone-acetaminophen], Sulfa drugs cross reactors, Vicodin [hydrocodone-acetaminophen], and Gluten meal     Assessment / Plan:     Visit Diagnoses: Autoimmune disease ([place]) - ANA 1:320 NO, dsDNA8, RF-,CCP-, History of nasal ulcers and oral ulcers in the past, positive synovitis in left hand MCPs on [place] previously: She has not had any signs or symptoms of a flare recently.  She is clinically doing well taking Plaquenil 200 mg 1 tablet by mouth twice daily Monday through Friday.  She has been tolerating Plaquenil without any side effects and has not missed any doses recently.  She continues to experience occasional myalgias and arthralgias but has no inflammation on examination today.  She has not had any oral or nasal ulcerations.  She continues to have chronic sicca symptoms so we discussed the use of over-the-counter products for symptomatic relief.  She has had less frequent symptoms of Raynaud's and no digital ulcerations or signs of gangrene were noted on examination today.  No Malar rash was noted.  She has not had any pleuritic chest pain, shortness of breath,  or palpations.  Lab work from 03/19/2020 was reviewed today in the office: ANA remains positive, double-stranded ENA 7, complements within normal limits, Ro-, La-, and ESR within normal limits. She is due to update lab work today.  Orders were released.  She will continue on Plaquenil as prescribed.  Refill of Plaquenil was sent to the pharmacy today.  She was advised to notify us if she develops any new or worsening symptoms.  She will follow-up in the office in 5 months.- Plan: CBC with Differential/Platelet, COMPLETE METABOLIC PANEL WITH GFR, Protein / creatinine ratio, urine, Anti-DNA antibody, double-stranded, C3 and C4, Sedimentation rate, Vitamin D (25 hydroxy), hydroxychloroquine (PLAQUENIL) 200 MG tablet  Sicca complex ([place]): She continues to have chronic sicca symptoms.  We discussed the use of over-the-counter products for symptomatic relief.  She recently purchased Biotene to try.  High risk medication use - Plaquenil 200 mg 1 tablet by mouth daily twice daily M-F only.  PLQ Eye Exam: 06/03/2020 WNL @ [facility]. Follow up in 1 year.  CBC and CMP within normal limits on 04/16/2020.  She is overdue to update lab work.  Orders for CBC and CMP were released. - Plan: CBC with Differential/Platelet, COMPLETE METABOLIC PANEL WITH GFR  Primary osteoarthritis of both hands: She experiences intermittent pain and stiffness in both hands but has not had any joint swelling.  No joint tenderness or synovitis was noted.  She was able to make a complete fist bilaterally.  We discussed the importance of joint protection and muscle strengthening for  Pain of both elbows: She has good range of motion of both elbow joints with no discomfort at this time.  No tenderness or inflammation was noted.  No olecranon bursitis noted.  Primary osteoarthritis of both knees: She has good range of motion of both knee joints on examination today.  No warmth or  effusion was noted.  She has bilateral knee crepitus.  Overall  her knee joint pain has improved recently.  Myofascial muscle pain: She experiences intermittent myalgias and muscle tenderness due to underlying myofascial pain.  Her symptoms have been more tolerable recently.  We discussed the importance of regular exercise.  Trochanteric bursitis of both hips: She continues to have discomfort due to trochanter bursitis of both hips.  She will be starting physical therapy soon and was encouraged to perform stretching exercises on a daily basis.  Vitamin D deficiency -Vitamin D level will be checked today.  Plan: Vitamin D (25 hydroxy)  Other medical conditions are listed as follows:  Family history of lupus erythematosus  Family history of rheumatoid arthritis  History of anemia  History of anxiety  History of hyperlipidemia  History of gastroesophageal reflux (GERD)  History of hypertension: BP was 117/82 today in the office.     Orders: Orders Placed This Encounter  Procedures  . CBC with Differential/Platelet  . COMPLETE METABOLIC PANEL WITH GFR  . Protein / creatinine ratio, urine  . Anti-DNA antibody, double-stranded  . C3 and C4  . Sedimentation rate  . Vitamin D (25 hydroxy)   Meds ordered this encounter  Medications  . hydroxychloroquine (PLAQUENIL) 200 MG tablet    Sig: Take 1 tablet by mouth twice daily Monday through Friday only.    Dispense:  120 tablet    Refill:  0     Follow-Up Instructions: Return in about 5 months (around 04/21/2021) for Autoimmune Disease.   Ofilia Neas, PA-C  Note - This record has been created using Dragon software.  Chart creation errors have been sought, but may not always  have been located. Such creation errors do not reflect on  the standard of medical care.

## 2020-11-19 ENCOUNTER — Ambulatory Visit: Payer: BC Managed Care – PPO | Admitting: Physician Assistant

## 2020-11-19 ENCOUNTER — Ambulatory Visit: Payer: BC Managed Care – PPO | Admitting: Medical

## 2020-11-19 ENCOUNTER — Other Ambulatory Visit: Payer: Self-pay

## 2020-11-19 ENCOUNTER — Encounter: Payer: Self-pay | Admitting: Physician Assistant

## 2020-11-19 ENCOUNTER — Encounter: Payer: Self-pay | Admitting: Medical

## 2020-11-19 VITALS — BP 110/72 | HR 58 | Temp 98.2°F | Ht 64.0 in | Wt 130.0 lb

## 2020-11-19 VITALS — BP 117/82 | HR 63 | Ht 64.0 in | Wt 130.2 lb

## 2020-11-19 DIAGNOSIS — M359 Systemic involvement of connective tissue, unspecified: Secondary | ICD-10-CM | POA: Diagnosis not present

## 2020-11-19 DIAGNOSIS — M3500 Sicca syndrome, unspecified: Secondary | ICD-10-CM | POA: Diagnosis not present

## 2020-11-19 DIAGNOSIS — Z840 Family history of diseases of the skin and subcutaneous tissue: Secondary | ICD-10-CM

## 2020-11-19 DIAGNOSIS — M7062 Trochanteric bursitis, left hip: Secondary | ICD-10-CM

## 2020-11-19 DIAGNOSIS — Z8261 Family history of arthritis: Secondary | ICD-10-CM

## 2020-11-19 DIAGNOSIS — M19042 Primary osteoarthritis, left hand: Secondary | ICD-10-CM

## 2020-11-19 DIAGNOSIS — Z8719 Personal history of other diseases of the digestive system: Secondary | ICD-10-CM

## 2020-11-19 DIAGNOSIS — Z8616 Personal history of COVID-19: Secondary | ICD-10-CM | POA: Diagnosis not present

## 2020-11-19 DIAGNOSIS — G4700 Insomnia, unspecified: Secondary | ICD-10-CM | POA: Diagnosis not present

## 2020-11-19 DIAGNOSIS — M7918 Myalgia, other site: Secondary | ICD-10-CM

## 2020-11-19 DIAGNOSIS — M25521 Pain in right elbow: Secondary | ICD-10-CM

## 2020-11-19 DIAGNOSIS — Z8659 Personal history of other mental and behavioral disorders: Secondary | ICD-10-CM

## 2020-11-19 DIAGNOSIS — H68013 Acute Eustachian salpingitis, bilateral: Secondary | ICD-10-CM | POA: Diagnosis not present

## 2020-11-19 DIAGNOSIS — M7061 Trochanteric bursitis, right hip: Secondary | ICD-10-CM

## 2020-11-19 DIAGNOSIS — Z8639 Personal history of other endocrine, nutritional and metabolic disease: Secondary | ICD-10-CM

## 2020-11-19 DIAGNOSIS — R21 Rash and other nonspecific skin eruption: Secondary | ICD-10-CM | POA: Diagnosis not present

## 2020-11-19 DIAGNOSIS — M25522 Pain in left elbow: Secondary | ICD-10-CM

## 2020-11-19 DIAGNOSIS — Z79899 Other long term (current) drug therapy: Secondary | ICD-10-CM

## 2020-11-19 DIAGNOSIS — R42 Dizziness and giddiness: Secondary | ICD-10-CM

## 2020-11-19 DIAGNOSIS — M170 Bilateral primary osteoarthritis of knee: Secondary | ICD-10-CM

## 2020-11-19 DIAGNOSIS — M19041 Primary osteoarthritis, right hand: Secondary | ICD-10-CM

## 2020-11-19 DIAGNOSIS — Z8679 Personal history of other diseases of the circulatory system: Secondary | ICD-10-CM

## 2020-11-19 DIAGNOSIS — Z862 Personal history of diseases of the blood and blood-forming organs and certain disorders involving the immune mechanism: Secondary | ICD-10-CM

## 2020-11-19 DIAGNOSIS — E559 Vitamin D deficiency, unspecified: Secondary | ICD-10-CM

## 2020-11-19 MED ORDER — CLARITHROMYCIN 500 MG PO TABS: 500.0000 mg | ORAL_TABLET | Freq: Two times a day (BID) | ORAL | 0 refills | Status: DC

## 2020-11-19 MED ORDER — HYDROXYCHLOROQUINE SULFATE 200 MG PO TABS
ORAL_TABLET | ORAL | 0 refills | Status: DC
Start: 2020-11-19 — End: 2021-01-29

## 2020-11-19 NOTE — Patient Instructions (Signed)
Recommendations  Drink 80-100 ounces of water daily  Consider nasal saline flush   Use Flonase or other steroid nasal spray for the next week  Consider either Mucinex (guaifenesin) or 1/2 - 1 dose benadryl twice daily the next 5-7 days  If not improving by Tuesday, or of worsening sinus pressure, we may end up needing to use antibiotic  I'll send antibiotic to pharmacy just in case

## 2020-11-19 NOTE — Progress Notes (Signed)
Subjective:  Shari Lawrence is a 60 y.o. female who presents for Chief Complaint  Patient presents with  . Dizziness    Not having today but prior there has been dizziness when standing quickly with some facial pressure      Here for dizziness if standing quickly x lat 5 days or so but not today.   Feels tight in neck when that happens.  Had covid around May 4th, had sinus drainage, but no fever, no loss of taste.   Someone that had it was around her, and she got tested when she got symptoms.   Was prescribed Paxlovid by Vickie here.  She does have some popping in her ears and some ear pressure.  Currently she denies palpitations, no fall, no new numbness or new tingling.  She has drank more water in the last day and feels better today.  No vision change no hearing change.  No other symptoms  Her psychiatrist prescribed her a sleep aid that should cause sedation.  She is concerned about this.  Sees Dr. Toy Care  No other aggravating or relieving factors.    No other c/o.  The following portions of the patient's history were reviewed and updated as appropriate: allergies, current medications, past family history, past medical history, past social history, past surgical history and problem list.  ROS Otherwise as in subjective above    Objective: BP 110/72   Pulse (!) 58   Temp 98.2 F (36.8 C)   Ht 5\' 4"  (1.626 m)   Wt 130 lb (59 kg)   LMP 10/25/2012 (Approximate)   SpO2 98%   BMI 22.31 kg/m   BP Readings from Last 3 Encounters:  11/19/20 110/72  10/29/20 116/71  10/08/20 100/70   Wt Readings from Last 3 Encounters:  11/19/20 130 lb (59 kg)  10/29/20 127 lb 3.2 oz (57.7 kg)  10/08/20 125 lb 6.4 oz (56.9 kg)    General appearance: alert, no distress, well developed, well nourished HEENT: normocephalic, sclerae anicteric, conjunctiva pink and moist, TMs flat, nares patent, no discharge or erythema, pharynx normal Oral cavity: MMM, no lesions Neck: supple, no  lymphadenopathy, no thyromegaly, no masses Heart: RRR, normal S1, S2, no murmurs Lungs: CTA bilaterally, no wheezes, rhonchi, or rales Pulses: 2+ radial pulses, 2+ pedal pulses, normal cap refill Ext: no edema Neuro: CN II through XII intact, nonfocal exam, negative cerebellar signs   Assessment: Encounter Diagnoses  Name Primary?  . Eustachian salpingitis, acute, bilateral Yes  . History of COVID-19   . Dizziness   . Insomnia, unspecified type      Plan: We discussed symptoms and concerns.  Exam and symptoms suggest eustachian tube dysfunction and status post COVID within this month.  Advise good hydration, nasal saline, rest, begin Benadryl or Mucinex as discussed, Flonase over-the-counter for nasal spray.  If not much improved within the next 4 days or for sinus pressure can begin antibiotic below.  [place] was seen today for dizziness.  Diagnoses and all orders for this visit:  Eustachian salpingitis, acute, bilateral  History of COVID-19  Dizziness  Insomnia, unspecified type  Other orders -     clarithromycin (BIAXIN) 500 MG tablet; Take 1 tablet (500 mg total) by mouth 2 (two) times daily.    Follow up: prn

## 2020-11-20 LAB — SEDIMENTATION RATE: Sed Rate: 22 mm/h (ref 0–30)

## 2020-11-20 LAB — CBC WITH DIFFERENTIAL/PLATELET
Absolute Monocytes: 394 cells/uL (ref 200–950)
Basophils Absolute: 29 cells/uL (ref 0–200)
Basophils Relative: 0.6 %
Eosinophils Absolute: 91 cells/uL (ref 15–500)
Eosinophils Relative: 1.9 %
HCT: 37 % (ref 35.0–45.0)
Hemoglobin: 11.8 g/dL (ref 11.7–15.5)
Lymphs Abs: 1589 cells/uL (ref 850–3900)
MCH: 29.3 pg (ref 27.0–33.0)
MCHC: 31.9 g/dL — ABNORMAL LOW (ref 32.0–36.0)
MCV: 91.8 fL (ref 80.0–100.0)
MPV: 12.5 fL (ref 7.5–12.5)
Monocytes Relative: 8.2 %
Neutro Abs: 2698 cells/uL (ref 1500–7800)
Neutrophils Relative %: 56.2 %
Platelets: 184 10*3/uL (ref 140–400)
RBC: 4.03 10*6/uL (ref 3.80–5.10)
RDW: 12.8 % (ref 11.0–15.0)
Total Lymphocyte: 33.1 %
WBC: 4.8 10*3/uL (ref 3.8–10.8)

## 2020-11-20 LAB — COMPLETE METABOLIC PANEL WITH GFR
AG Ratio: 1.7 (calc) (ref 1.0–2.5)
ALT: 30 U/L — ABNORMAL HIGH (ref 6–29)
AST: 26 U/L (ref 10–35)
Albumin: 4.3 g/dL (ref 3.6–5.1)
Alkaline phosphatase (APISO): 75 U/L (ref 37–153)
BUN: 17 mg/dL (ref 7–25)
CO2: 28 mmol/L (ref 20–32)
Calcium: 10.1 mg/dL (ref 8.6–10.4)
Chloride: 107 mmol/L (ref 98–110)
Creat: 1.02 mg/dL (ref 0.50–1.05)
GFR, Est African American: 70 mL/min/{1.73_m2} (ref >=60)
GFR, Est Non African American: 60 mL/min/{1.73_m2} (ref >=60)
Globulin: 2.5 g/dL (calc) (ref 1.9–3.7)
Glucose, Bld: 75 mg/dL (ref 65–99)
Potassium: 4.4 mmol/L (ref 3.5–5.3)
Sodium: 142 mmol/L (ref 135–146)
Total Bilirubin: 0.4 mg/dL (ref 0.2–1.2)
Total Protein: 6.8 g/dL (ref 6.1–8.1)

## 2020-11-20 LAB — C3 AND C4
C3 Complement: 135 mg/dL (ref 83–193)
C4 Complement: 23 mg/dL (ref 15–57)

## 2020-11-20 LAB — PROTEIN / CREATININE RATIO, URINE
Creatinine, Urine: 461 mg/dL — ABNORMAL HIGH (ref 20–275)
Protein/Creat Ratio: 80 mg/g creat (ref 21–161)
Protein/Creatinine Ratio: 0.08 mg/mg creat (ref 0.021–0.161)
Total Protein, Urine: 37 mg/dL — ABNORMAL HIGH (ref 5–24)

## 2020-11-20 LAB — ANTI-DNA ANTIBODY, DOUBLE-STRANDED: ds DNA Ab: 7 IU/mL — ABNORMAL HIGH

## 2020-11-20 LAB — VITAMIN D 25 HYDROXY (VIT D DEFICIENCY, FRACTURES): Vit D, 25-Hydroxy: 54 ng/mL (ref 30–100)

## 2020-11-20 NOTE — Progress Notes (Signed)
Reviewed lab work with Dr. Estanislado Pandy.  CBC WNL.  ALT is borderline elevated-30.  AST WNL.  Rest of CMP WNL.   ESR and complements WNL.  dsDNA is stable-7.   Vitamin D is WNL.  Continue on a maintenance dose of vitamin D daily.   Protein is slightly elevated in urine. Protein creatinine ratio is WNL.  We will recheck lab work in 3 months.  No change in therapy at this time.

## 2020-12-22 ENCOUNTER — Other Ambulatory Visit: Payer: Self-pay

## 2020-12-22 ENCOUNTER — Inpatient Hospital Stay: Admission: RE | Admit: 2020-12-22 | Payer: BC Managed Care – PPO | Source: Ambulatory Visit

## 2020-12-22 ENCOUNTER — Ambulatory Visit
Admission: RE | Admit: 2020-12-22 | Discharge: 2020-12-22 | Disposition: A | Discharge status: Home / Self Care | Payer: BC Managed Care – PPO | Source: Ambulatory Visit | Attending: Obstetrics and Gynecology | Admitting: Obstetrics and Gynecology

## 2020-12-22 DIAGNOSIS — Z1231 Encounter for screening mammogram for malignant neoplasm of breast: Secondary | ICD-10-CM

## 2020-12-22 LAB — HM MAMMOGRAPHY

## 2021-01-05 ENCOUNTER — Other Ambulatory Visit: Payer: Self-pay

## 2021-01-05 ENCOUNTER — Ambulatory Visit: Payer: BC Managed Care – PPO | Admitting: Neurology

## 2021-01-05 VITALS — BP 112/74 | HR 76 | Ht 64.0 in | Wt 133.0 lb

## 2021-01-05 DIAGNOSIS — M542 Cervicalgia: Secondary | ICD-10-CM

## 2021-01-05 DIAGNOSIS — G5603 Carpal tunnel syndrome, bilateral upper limbs: Secondary | ICD-10-CM

## 2021-01-05 NOTE — Progress Notes (Signed)
Chief Complaint  Patient presents with   Follow-up    States her neck pain is worse while working, she has been off this summer, she did not start PT.     [place]  Shari Lawrence is a 60 y.o. female   History of cervical decompression surgery,Intermittent neck pain  Overall has improved,  MRI of cervical spine in February 2022: Showed ACDF C5-6 and 7, C4-5 with mild degenerative changes, no significant canal and foraminal narrowing  Overall has improved , encouraged her to continue moderate exercise Persistent right upper thoracic area radiating pain,  Brisk reflexes on examinations  MRI of thoracic spine showed no significant abnormality  DIAGNOSTIC DATA (LABS, IMAGING, TESTING) - I reviewed patient records, labs, notes, testing and imaging myself where available.  Shari Lawrence is a 60 year old female, seen in request by rheumatologist Dr. [name], and primary care physician Dr. Carlena Hurl, for evaluation of neck pain, radiating pain to right arm, initial evaluation was on April 08, 2019.   Past medical history of hypertension, hyperlipidemia, autoimmune disease, taking Plaquenil.  She did have a history of cervical decompression surgery in 2010, prior to the surgery, she presented with right cervical radiculopathy.   Since April 2020, she began to notice recurrent right neck pain, radiating pain to right shoulder, right arm, sometimes woke her up from sleep, she is helping her elderly parents, sometimes bearing weight of her father with her right shoulder, in addition, she complains of intermittent bilateral lower extremity numbness since 01-26-19, denies significant gait abnormality, no bowel bladder incontinence.   She also complains of low back pain, but denies shooting pain to bilateral lower extremities.  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  UPDATE January 05 2021: She is overall doing much better, neck pain has improved, intermittent low back pain depending on her activity, stretching, lying flat usually helps, occasionally neck pain at the base of occipital region, rarely radiating pain, still has right hand intermittent numbness, previous EMG nerve conduction study February 2021 confirmed bilateral carpal tunnel syndromes, right side is more severe moderate  PHYSICAL EXAM   Vitals:   01/05/21 1450  Weight: 133 lb (60.3 kg)   Not recorded     Body mass index is 22.83 kg/m.  PHYSICAL EXAMNIATION:  Gen: NAD, conversant, well nourised, well groomed                     Cardiovascular: Regular rate rhythm, no peripheral edema, warm, nontender. Eyes: Conjunctivae clear without exudates or hemorrhage Neck: Supple, no carotid bruits. Pulmonary: Clear to auscultation bilaterally   NEUROLOGICAL EXAM:  MENTAL STATUS: Speech:    Speech is normal; fluent and spontaneous with normal comprehension.  Cognition:     Orientation to time, place and person     Normal recent and remote memory  Normal Attention span and concentration     Normal Language, naming, repeating,spontaneous speech     Fund of knowledge   CRANIAL NERVES: CN II: Visual fields are full to confrontation. Pupils are round equal and briskly reactive to light. CN III,  IV, VI: extraocular movement are normal. No ptosis. CN V: Facial sensation is intact to light touch CN VII: Face is symmetric with normal eye closure  CN VIII: Hearing is normal to causal conversation. CN IX, X: Phonation is normal. CN XI: Head turning and shoulder shrug are intact  MOTOR: There is no pronator drift of out-stretched arms. Muscle bulk and tone are normal. Muscle strength is normal.  REFLEXES: Reflexes are 2+ and symmetric at the biceps, triceps, 3/3 knees, and ankles. Plantar responses are flexor.  SENSORY: Intact to light touch, pinprick and vibratory sensation are intact in fingers and toes.  COORDINATION: There is no trunk or limb dysmetria noted.  GAIT/STANCE: She can get up from seated position arm crossed, steady  REVIEW OF SYSTEMS:  Full 14 system review of systems performed and notable only for as above All other review of systems were negative.  ALLERGIES: Allergies  Allergen Reactions   Quinolones Anaphylaxis and Other (See Comments)    Whelps developed all over her body   Garlic    Latex Itching and Other (See Comments)    Skin yellowing   Levaquin [Levofloxacin] Hives    Throat swelling   Losartan     Intolerance, fluctuating BPs   Metoprolol     Self reported hypotension   Onion    Penicillins    Percocet [Oxycodone-Acetaminophen]    Sulfa Drugs Cross Reactors Itching, Swelling and Other (See Comments)    Red face   Vicodin [Hydrocodone-Acetaminophen] Itching and Nausea And Vomiting   Gluten Meal     Other reaction(s): Other (See Comments) IBS    HOME MEDICATIONS: Current Outpatient Medications  Medication Sig Dispense Refill   acetaminophen (TYLENOL) 500 MG tablet Take 1 tablet (500 mg total) by mouth every 6 (six) hours as needed. 30 tablet 0   ALPRAZolam (XANAX) 0.5 MG tablet Take 0.5 mg by mouth as needed for anxiety.     amLODipine (NORVASC) 5 MG tablet Take 1 tablet (5 mg total) by mouth daily. 90 tablet 3   Ascorbic Acid  (VITAMIN C) 1000 MG tablet Take 1,000 mg by mouth daily.     aspirin EC 81 MG tablet Take 81 mg by mouth as needed.     atorvastatin (LIPITOR) 20 MG tablet TAKE 1 TABLET BY MOUTH EVERY DAY 90 tablet 0   B Complex Vitamins (B COMPLEX PO) Take 1 tablet by mouth daily.     BELSOMRA 10 MG TABS Take 1 tablet by mouth at bedtime. (Patient not taking: No sig reported)     benzonatate (TESSALON) 200 MG capsule Take 1 capsule (200 mg total) by mouth 2 (two) times daily as needed. (Patient not taking: No sig reported) 20 capsule 0   calcium carbonate (OS-CAL) 600 MG TABS Take 600 mg by mouth daily.     CINNAMON PO Take 1 tablet by mouth daily.      clarithromycin (BIAXIN) 500 MG tablet Take 1 tablet (500 mg total) by mouth 2 (two) times daily. (Patient not taking: Reported on 11/19/2020) 20 tablet 0   diclofenac sodium (VOLTAREN) 1 % GEL 3 grams to 3 large joints up to 3 times daily (Patient taking differently: as needed. 3 grams to 3 large joints up  to 3 times daily) 3 Tube 3   escitalopram (LEXAPRO) 10 MG tablet Take 0.5 tablets (5 mg total) by mouth daily. 30 tablet 0   estradiol (VIVELLE-DOT) 0.025 MG/24HR Place 1 patch onto the skin 2 (two) times a week.     Estradiol 10 MCG TABS vaginal tablet Place one tablet vaginally qhs x 1 week, then change to 2 x a week. (Patient not taking: No sig reported) 24 tablet 0   fish oil-omega-3 fatty acids 1000 MG capsule Take 1 g by mouth daily.     fluticasone (FLONASE) 50 MCG/ACT nasal spray Place 2 sprays into both nostrils daily. 16 g 6   hydroxychloroquine (PLAQUENIL) 200 MG tablet Take 1 tablet by mouth twice daily Monday through Friday only. 120 tablet 0   lubiprostone (AMITIZA) 24 MCG capsule Take 1 capsule (24 mcg total) by mouth 2 (two) times daily with a meal. 180 capsule 1   Multiple Vitamin (MULTIVITAMIN) tablet Take 1 tablet by mouth daily.     NEXIUM 40 MG capsule Take 40 mg by mouth daily.      Nirmatrelvir & Ritonavir (PAXLOVID) 20 x 150 MG & 10 x  100MG  TBPK Take 3 tablets by mouth in the morning and at bedtime. (Patient not taking: No sig reported) 30 tablet 0   Probiotic Product (PROBIOTIC ADVANCED PO) Take 1 tablet by mouth daily.      TURMERIC PO Take 1 tablet by mouth daily.      No current facility-administered medications for this visit.    PAST MEDICAL HISTORY: Past Medical History:  Diagnosis Date   Abnormal Pap smear 1988   treated with cryo   Anemia 11/2010   hematology consult prior; etiology malabsorption and uterine bleeding   Anxiety    COVID-19 10/28/2020   Gastric polyp    GERD (gastroesophageal reflux disease)    H/O bone density study 12/2007   H/O hysterectomy for benign disease 5/14   History of echocardiogram 03/2010   normal LV function, EF 60-65%, mild left atrial enlargement   HTN (hypertension) 03/2010   hospitalization for HTN urgency   Hyperlipemia    Internal hemorrhoids    Lumbar degenerative disc disease    Migraine    Myalgia    Numbness and tingling    Prediabetes    Spondylosis, cervical    Umbilical hernia    Uterine fibroid    hx/o     PAST SURGICAL HISTORY: Past Surgical History:  Procedure Laterality Date   ABDOMINAL HYSTERECTOMY     CERVICAL FUSION  2010   CERVICAL SPINE SURGERY     CERVIX LESION DESTRUCTION  1988   CESAREAN SECTION     CHOLECYSTECTOMY  2003   COLONOSCOPY  08/2010   Dr. Collene Mares   COLONOSCOPY  02/2017   Dr. Collene Mares   ESOPHAGOGASTRODUODENOSCOPY  08/2010   Dr. Collene Mares   EXPLORATORY LAPAROTOMY     HERNIA REPAIR     MYOMECTOMY  1998   PELVIC LAPAROSCOPY     ROBOTIC ASSISTED LAPAROSCOPIC HYSTERECTOMY AND SALPINGECTOMY  10/2012   [facility]; laproscopic due to fibroids   UMBILICAL HERNIA REPAIR  10/2013   infected laparoscopic port, mesh placement    FAMILY HISTORY: Family History  Problem Relation Age of Onset   Hypertension Mother    Dementia Mother    Colon cancer Mother 59   Deep vein thrombosis Father    Hypertension Father    Cancer Father        prostate  [place]  Heart disease Father 57       CAD   Parkinsonism Father    Hypertension Sister    Hyperlipidemia Sister    Sjogren's syndrome Sister    Hypertension Brother    Hyperlipidemia Brother    Hypertension Maternal Aunt    Hyperlipidemia Maternal Aunt    Rheum arthritis Maternal Aunt    Diabetes Maternal Aunt        1 with type II, 1 with type 1   Hypertension Brother    Rheum arthritis Brother    Diabetes Paternal Aunt        type II   Breast cancer Neg Hx     SOCIAL HISTORY: Social History   Socioeconomic History   Marital status: Divorced    Spouse name: Not on file   Number of children: 1   Years of education: college   Highest education level: Doctorate  Occupational History    Employer: [employer]    Comment: molecular Production designer, theatre/television/film, PhD candidate, [employer]  Tobacco Use   Smoking status: Never   Smokeless tobacco: Never  Vaping Use   Vaping Use: Never used  Substance and Sexual Activity   Alcohol use: No   Drug use: No   Sexual activity: Not Currently    Partners: Male    Birth control/protection: Surgical    Comment: hysterectomy  Other Topics Concern   Not on file  Social History Narrative   Single, 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  Has 48 yo daughter.  Her siblings and her don't agree on helping take care of their parents, causes family tension.  As of 11/2014      Right-handed.   0.5 cup caffeine per day.   Her father lives with her.      Update 09/2019--father passed away from urosepsis in 09/16/2019   Social Determinants of Health   Financial Resource Strain: Not on file  Food Insecurity: Not on file  Transportation Needs: Not on file  Physical Activity: Not on file  Stress: Not on file  Social Connections: Not on file  Intimate Partner  Violence: Not on file       Marcial Pacas, M.D. Ph.D.  [facility] [address], [place], [place] [address] Ph: [phone] Fax: [phone]  CC:  Tysinger, [name], PA-C [address] [place],  [place] [address]  Tysinger, [name], PA-C

## 2021-01-06 ENCOUNTER — Encounter: Payer: Self-pay | Admitting: Neurology

## 2021-01-12 ENCOUNTER — Other Ambulatory Visit: Payer: Self-pay | Admitting: Medical

## 2021-01-19 ENCOUNTER — Telehealth: Payer: Self-pay

## 2021-01-19 ENCOUNTER — Other Ambulatory Visit: Payer: Self-pay | Admitting: *Deleted

## 2021-01-19 MED ORDER — MAGIC MOUTHWASH
5.0000 mL | Freq: Four times a day (QID) | ORAL | 0 refills | Status: DC
Start: 2021-01-19 — End: 2021-01-19

## 2021-01-19 MED ORDER — MAGIC MOUTHWASH: 5.0000 mL | Freq: Four times a day (QID) | ORAL | 0 refills | Status: DC

## 2021-01-19 NOTE — Telephone Encounter (Signed)
If the patient is experiencing only mouth dryness I would recommend trying biotene products or ACT mouthwash for dry mouth for symptomatic relief.   If she is having mouth sores we can send in a prescription for magic mouth wash.

## 2021-01-19 NOTE — Telephone Encounter (Signed)
I called patient, RX faxed.

## 2021-01-19 NOTE — Telephone Encounter (Signed)
I called patient and discussed.  She states that the tip of her tongue is red and irritated.  She does not see any white patches.  I discussed that I will call in Magic mouthwash.  She was in agreement.  Please call in a prescription for Magic mouthwash.

## 2021-01-19 NOTE — Telephone Encounter (Signed)
Patient called stating the tip of her tongue is red and very painful.  Patient states the symptoms began on Saturday, 01/16/21.  Patient states she has experienced this in the past, but the pain has never lasted this long.  Patient requested a return call.

## 2021-01-20 ENCOUNTER — Other Ambulatory Visit: Payer: Self-pay | Admitting: Medical

## 2021-01-20 NOTE — Telephone Encounter (Signed)
Pt  has not been seen since 10/21 and she doesn't have a future appt. Please advise [facility]

## 2021-01-29 ENCOUNTER — Other Ambulatory Visit: Payer: Self-pay | Admitting: Physician Assistant

## 2021-01-29 DIAGNOSIS — M359 Systemic involvement of connective tissue, unspecified: Secondary | ICD-10-CM

## 2021-01-29 NOTE — Telephone Encounter (Signed)
Last Visit: 11/19/2020  Next Visit: 04/22/2021  Labs: 11/19/2020 CBC WNL.  ALT is borderline elevated-30.  AST WNL.  Rest of CMP WNL.   ESR and complements WNL.  dsDNA is stable-7.   Vitamin D is WNL  Eye exam: 06/03/2020 WNL   Current Dose per office note 11/19/2020: Plaquenil 200 mg 1 tablet by mouth daily twice daily M-F only.  YI:[id number] disease  Last Fill: 11/19/2020  Okay to refill Plaquenil?

## 2021-02-03 ENCOUNTER — Ambulatory Visit: Payer: BC Managed Care – PPO | Admitting: Medical

## 2021-02-03 ENCOUNTER — Other Ambulatory Visit: Payer: Self-pay

## 2021-02-03 VITALS — BP 120/70 | HR 58 | Temp 97.7°F | Wt 133.2 lb

## 2021-02-03 DIAGNOSIS — M79602 Pain in left arm: Secondary | ICD-10-CM | POA: Diagnosis not present

## 2021-02-03 DIAGNOSIS — M5410 Radiculopathy, site unspecified: Secondary | ICD-10-CM

## 2021-02-03 DIAGNOSIS — M542 Cervicalgia: Secondary | ICD-10-CM

## 2021-02-03 DIAGNOSIS — Z9889 Other specified postprocedural states: Secondary | ICD-10-CM

## 2021-02-03 MED ORDER — PREDNISONE 10 MG PO TABS: 10.0000 mg | ORAL_TABLET | Freq: Every day | ORAL | 0 refills | Status: DC

## 2021-02-03 MED ORDER — METHOCARBAMOL 500 MG PO TABS: 500.0000 mg | ORAL_TABLET | Freq: Two times a day (BID) | ORAL | 0 refills | Status: DC | PRN

## 2021-02-03 NOTE — Progress Notes (Signed)
Subjective:  Shari Lawrence is a 60 y.o. female who presents for Chief Complaint  Patient presents with   left arm pain    Left arm pain that radiates down hand since yesterday. No other symptoms      Here for complaint of left arm pain radiating down the arm and upper back pain.  This started this past weekend about 3 to 4 days ago.  She denies injury or trauma.  She just got back from a trip to [place] at [employer].  This was a long car ride down and back and she rode some virtual amusement rides that combined visual effects and moving car.  She did not ride roller coasters.  She does note being moved around in the ride.  Over this past weekend she started developing pains in her left upper back and arm.  She feels some muscle spasms in the upper back.  She is taking some over the counter ibuprofen 600 mg at a time.  for months she has been seeing neurology about neck pain and arm pain and radicular pain.  She had an MRI in February.  She actually just got released from neurology as things are stable and they did not feel like she needed any other intervention  No other aggravating or relieving factors.    No other c/o.  Past Medical History:  Diagnosis Date   Abnormal Pap smear 1988   treated with cryo   Anemia 11/2010   hematology consult prior; etiology malabsorption and uterine bleeding   Anxiety    COVID-19 10/28/2020   Gastric polyp    GERD (gastroesophageal reflux disease)    H/O bone density study 12/2007   H/O hysterectomy for benign disease 5/14   History of echocardiogram 03/2010   normal LV function, EF 60-65%, mild left atrial enlargement   HTN (hypertension) 03/2010   hospitalization for HTN urgency   Hyperlipemia    Internal hemorrhoids    Lumbar degenerative disc disease    Migraine    Myalgia    Numbness and tingling    Prediabetes    Spondylosis, cervical    Umbilical hernia    Uterine fibroid    hx/o    Past Surgical History:  Procedure  Laterality Date   ABDOMINAL HYSTERECTOMY     CERVICAL FUSION  2010   CERVICAL SPINE SURGERY     CERVIX LESION DESTRUCTION  1988   CESAREAN SECTION     CHOLECYSTECTOMY  2003   COLONOSCOPY  08/2010   Dr. Collene Mares   COLONOSCOPY  02/2017   Dr. Collene Mares   ESOPHAGOGASTRODUODENOSCOPY  08/2010   Dr. Collene Mares   EXPLORATORY LAPAROTOMY     HERNIA REPAIR     MYOMECTOMY  1998   PELVIC LAPAROSCOPY     ROBOTIC ASSISTED LAPAROSCOPIC HYSTERECTOMY AND SALPINGECTOMY  10/2012   [facility]; laproscopic due to fibroids   UMBILICAL HERNIA REPAIR  10/2013   infected laparoscopic port, mesh placement     The following portions of the patient's history were reviewed and updated as appropriate: allergies, current medications, past family history, past medical history, past social history, past surgical history and problem list.  ROS Otherwise as in subjective above  Objective: BP 120/70   Pulse (!) 58   Temp 97.7 F (36.5 C)   Wt 133 lb 3.2 oz (60.4 kg)   LMP 10/25/2012 (Approximate)   BMI 22.86 kg/m   General appearance: alert, no distress, well developed, well nourished HEENT: normocephalic, sclerae anicteric, conjunctiva  pink and moist, TMs pearly, nares patent, no discharge or erythema, pharynx normal Oral cavity: MMM, no lesions Neck: supple, no lymphadenopathy, no thyromegaly, no masses Heart: RRR, normal S1, S2, no murmurs Lungs: CTA bilaterally, no wheezes, rhonchi, or rales Abdomen: +bs, soft, non tender, non distended, no masses, no hepatomegaly, no splenomegaly Pulses: 2+ radial pulses, 2+ pedal pulses, normal cap refill Ext: no edema   Cervical spine MRI 08/04/2020 IMPRESSION:   MRI cervical spine without contrast demonstrating: - C4-5 minimal disc bulging and uncovertebral joint hypertrophy resulting in borderline mild spinal stenosis and moderate bilateral foraminal stenosis; no cord signal abnormalities. - C7-T1 disc bulging and uncovertebral joint hypertrophy with moderate right and mild left  foraminal stenosis - Anterior cervical discectomy and fusion spanning C5, C6 and C7 levels with metallic hardware.    Assessment: Encounter Diagnoses  Name Primary?   Left arm pain Yes   Radicular pain    History of neck surgery    Neck pain      Plan: I reviewed her MRI cervical spine from February 2022 as well as her neurology visit for neck pain just recently January 05, 2021  I suspect her current symptoms are more related to recent inflammation from activity.  She just recently had a long car ride to [place] and went on some rides at [employer].  So the combination of lots of walking and riding amusement rides and having a long travel likely flared up her symptoms  Begin medication as below, advised massage therapy, advised stretching and heat.  If not much improved within the next 10 days then recheck.  She has a massage therapist that she uses  If not much improved within the next 10 days or if worsening symptoms in the meantime then recheck  Shari Lawrence was seen today for left arm pain.  Diagnoses and all orders for this visit:  Left arm pain  Radicular pain  History of neck surgery  Neck pain  Other orders -     methocarbamol (ROBAXIN) 500 MG tablet; Take 1 tablet (500 mg total) by mouth 2 (two) times daily as needed for muscle spasms. -     predniSONE (DELTASONE) 10 MG tablet; Take 1 tablet (10 mg total) by mouth daily with breakfast.   Follow up: prn

## 2021-02-24 ENCOUNTER — Other Ambulatory Visit: Payer: Self-pay

## 2021-02-24 MED ORDER — MAGIC MOUTHWASH: 5.0000 mL | Freq: Four times a day (QID) | ORAL | 0 refills | Status: DC

## 2021-02-24 NOTE — Telephone Encounter (Signed)
Last Visit: 11/19/2020  Next Visit: 04/22/2021  Okay to refill magic mouthwash?

## 2021-02-24 NOTE — Telephone Encounter (Signed)
Patient called checking the status of her prescription of magic mouthwash.  Patient states [facility] never received the prescription.  Patient states her mouth got better so she didn't need it any longer, but now her mouth is beginning to develop more sores.  Patient [facility] on [address].

## 2021-03-10 ENCOUNTER — Encounter: Payer: Self-pay | Admitting: Internal Medicine

## 2021-03-24 ENCOUNTER — Other Ambulatory Visit: Payer: Self-pay | Admitting: Physician Assistant

## 2021-03-24 ENCOUNTER — Encounter: Payer: Self-pay | Admitting: Internal Medicine

## 2021-03-24 DIAGNOSIS — M359 Systemic involvement of connective tissue, unspecified: Secondary | ICD-10-CM

## 2021-03-26 LAB — HM COLONOSCOPY

## 2021-04-09 NOTE — Progress Notes (Signed)
Office Visit Note  Patient: Shari Lawrence             Date of Birth: [birth year]           MRN: [id number]             PCP: Carlena Hurl, PA-C Referring: Carlena Hurl, PA-C Visit Date: 04/22/2021 Occupation: @GUAROCC @  Subjective:  Osteoarthritis (Neck, bil elbows, bil hand pain, right low back side pain)   History of Present Illness: Shari Lawrence is a 60 y.o. female with a history of autoimmune disease, osteoarthritis and myofascial pain.  She states that she developed some lower back discomfort in August  for which she was seen by her PCP.  She was given a prescription of Robaxin and prednisone but she did not take the medications.  She states that the symptoms gradually improved.  She continues to have off-and-on discomfort in her elbows due to epicondylitis.  She has a stiffness in her hands and her feet.  She has not seen any joint swelling.  There is no history of oral ulcers, nasal ulcers but she has sensitivity in her mouth.  She was seen by a dentist recently who gave her mouthwash.  She denies any history of raynaud's phenominon, photosensitivity, lymphadenopathy.  She has not seen any joint swelling.  Activities of Daily Living:  Patient reports morning stiffness for 1 hour.   Patient Reports nocturnal pain.  Difficulty dressing/grooming: Denies Difficulty climbing stairs: Reports Difficulty getting out of chair: Reports Difficulty using hands for taps, buttons, cutlery, and/or writing: Reports  Review of Systems  Constitutional:  Negative for fatigue.  HENT:  Positive for mouth dryness.   Eyes:  Positive for dryness.  Respiratory:  Negative for shortness of breath.   Cardiovascular:  Negative for swelling in legs/feet.  Gastrointestinal:  Positive for constipation.  Endocrine: Positive for cold intolerance and heat intolerance.  Genitourinary:  Negative for difficulty urinating.  Musculoskeletal:  Positive for joint pain, joint pain, morning  stiffness and muscle tenderness.  Skin:  Negative for rash.  Allergic/Immunologic: Negative for susceptible to infections.  Neurological:  Negative for numbness.  Hematological:  Negative for bruising/bleeding tendency.  Psychiatric/Behavioral:  Positive for sleep disturbance.    PMFS History:  Patient Active Problem List   Diagnosis Date Noted   Chronic right-sided thoracic back pain 07/28/2020   Cervicalgia 07/28/2020   Prediabetes    Facial numbness 05/04/2020   Leg numbness 05/04/2020   Knee swelling 05/04/2020   Facial pain 05/04/2020   Ear pain, left 05/04/2020   History of recent stressful life event 11/20/2019   Panic attack 11/15/2019   Fall 11/13/2019   Pain in both hands 11/13/2019   Sleep disturbance 11/13/2019   Grief 11/13/2019   Bilateral carpal tunnel syndrome 08/21/2019   Chronic pain of both knees 06/14/2019   Right arm pain 06/14/2019   Close exposure to COVID-19 virus 06/14/2019   Paresthesia 04/08/2019   Neck pain 03/30/2019   Radicular pain in right arm 03/30/2019   Numbness of leg 03/30/2019   Chest pain 07/24/2018   Hypotension 07/17/2018   Vertigo 07/17/2018   Nausea 07/17/2018   Vaccine counseling 05/21/2018   Need for influenza vaccination 05/21/2018   Routine general medical examination at a health care facility 05/21/2018   Caregiver burden 07/05/2017   Cyst of skin 07/05/2017   Elevated blood pressure reading 06/28/2017   Leg pain, diffuse, left 06/28/2017   Leg swelling 06/28/2017   Bruising 06/28/2017  Myofascial muscle pain 06/28/2017   High risk medication use 03/30/2017   Chronic constipation 11/17/2016   Globus sensation 11/17/2016   Primary osteoarthritis of both knees 10/05/2016   Autoimmune disease ([place]) 09/29/2016   Primary osteoarthritis of both hands 09/29/2016   Family history of lupus erythematosus 09/20/2016   Polyarthralgia 05/06/2016   Clenching of teeth 05/06/2016   Finger swelling 05/06/2016   Morning stiffness  of joints 05/06/2016   Myalgia 05/06/2016   Family history of rheumatoid arthritis 05/06/2016   Essential hypertension 12/23/2014   History of anemia 12/23/2014   Care provider for parents 12/23/2014   Post-operative state 11/13/2012   Hyperlipidemia 11/11/2012   Hypertension 11/11/2012   Impaired fasting glucose 10/25/2011   Gluten intolerance 10/25/2011   Gastroesophageal reflux disease without esophagitis 10/25/2011   Anxiety    Iron deficiency anemia     Past Medical History:  Diagnosis Date   Abnormal Pap smear 1988   treated with cryo   Anemia 11/2010   hematology consult prior; etiology malabsorption and uterine bleeding   Anxiety    COVID-19 10/28/2020   Gastric polyp    GERD (gastroesophageal reflux disease)    H/O bone density study 12/2007   H/O hysterectomy for benign disease 5/14   History of echocardiogram 03/2010   normal LV function, EF 60-65%, mild left atrial enlargement   HTN (hypertension) 03/2010   hospitalization for HTN urgency   Hyperlipemia    Internal hemorrhoids    Lumbar degenerative disc disease    Migraine    Myalgia    Numbness and tingling    Prediabetes    Spondylosis, cervical    Umbilical hernia    Uterine fibroid    hx/o     Family History  Problem Relation Age of Onset   Hypertension Mother    Dementia Mother    Colon cancer Mother 25   Deep vein thrombosis Father    Hypertension Father    Cancer Father        prostate CA   Heart disease Father 10       CAD   Parkinsonism Father    Hypertension Sister    Hyperlipidemia Sister    Sjogren's syndrome Sister    Hypertension Brother    Hyperlipidemia Brother    Hypertension Maternal Aunt    Hyperlipidemia Maternal Aunt    Rheum arthritis Maternal Aunt    Diabetes Maternal Aunt        1 with type II, 1 with type 1   Hypertension Brother    Rheum arthritis Brother    Diabetes Paternal Aunt        type II   Breast cancer Neg Hx    Past Surgical History:  Procedure  Laterality Date   ABDOMINAL HYSTERECTOMY     CERVICAL FUSION  2010   CERVICAL SPINE SURGERY     CERVIX LESION DESTRUCTION  1988   CESAREAN SECTION     CHOLECYSTECTOMY  2003   COLONOSCOPY  08/2010   Dr. Collene Mares   COLONOSCOPY  02/2017   Dr. Collene Mares   ESOPHAGOGASTRODUODENOSCOPY  08/2010   Dr. 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  Has 4 yo daughter.  Her siblings and her don't agree on helping take care of their parents, causes family tension.  As of 11/2014      Right-handed.   0.5 cup caffeine per day.   Her father lives with her.      Update 09/2019--father passed away from urosepsis in 09-Oct-2019   Immunization History  Administered Date(s) Administered   Influenza,inj,Quad PF,6+ Mos 05/01/2013, 03/31/2014, 05/17/2017, 05/21/2018, 03/29/2019, 04/10/2020   PFIZER Comirnaty(Gray Top)Covid-19 Tri-Sucrose Vaccine 10/08/2020   PFIZER(Purple Top)SARS-COV-2 Vaccination 08/24/2019, 09/17/2019, 03/30/2020   Td 02/04/2012   Zoster Recombinat (Shingrix) 09/15/2020     Objective: Vital Signs: BP 116/72 (BP Location: Left Arm, Patient Position: Sitting, Cuff Size: Normal)   Pulse 60   Resp 14   Ht 5\' 4"  (1.626 m)   Wt 132 lb (59.9 kg)   LMP 10/25/2012 (Approximate)   BMI 22.66 kg/m    Physical Exam Vitals and nursing note reviewed.  Constitutional:      Appearance: She is well-developed.  HENT:     Head: Normocephalic and atraumatic.  Eyes:     Conjunctiva/sclera:  Conjunctivae normal.  Cardiovascular:     Rate and Rhythm: Normal rate and regular rhythm.     Heart sounds: Normal heart sounds.  Pulmonary:     Effort: Pulmonary effort is normal.     Breath sounds: Normal breath sounds.  Abdominal:     General: Bowel sounds are normal.     Palpations: Abdomen is soft.  Musculoskeletal:     Cervical back: Normal range of motion.  Lymphadenopathy:     Cervical: No cervical adenopathy.  Skin:    General: Skin is warm and dry.     Capillary Refill: Capillary refill takes less than 2 seconds.  Neurological:     Mental Status: She is alert and oriented to person, place, and time.  Psychiatric:        Behavior: Behavior normal.     Musculoskeletal Exam: C-spine thoracic and lumbar spine with good range of motion.  Shoulder joints, elbow joints, wrist joints, MCPs PIPs and DIPs with good range of motion.  She had bilateral CMC PIP and DIP thickening consistent with osteoarthritis.  Hip joints and knee joints with good range of motion without any warmth swelling or effusion.  There was no tenderness over ankles or MTPs.  CDAI Exam: CDAI Score: -- Patient Global: --; Provider Global: -- Swollen: --; Tender: -- Joint Exam 04/22/2021   No joint exam has been documented for this visit   There is currently no information documented on the homunculus. Go to the Rheumatology activity and complete the homunculus joint exam.  Investigation: No additional findings.  Imaging: No results found.  Recent Labs: Lab Results  Component Value Date   WBC 4.8 11/19/2020   HGB 11.8 11/19/2020   PLT 184 11/19/2020   NA 142 11/19/2020   K 4.4 11/19/2020   CL 107 11/19/2020   CO2 28 11/19/2020   GLUCOSE 75 11/19/2020   BUN 17 11/19/2020   CREATININE 1.02 11/19/2020   BILITOT 0.4 11/19/2020   ALKPHOS 72 04/16/2020   AST 26 11/19/2020   ALT 30 (H) 11/19/2020   PROT 6.8 11/19/2020   ALBUMIN 4.4 04/16/2020   CALCIUM 10.1 11/19/2020  GFRAA 70 11/19/2020     Speciality Comments: PLQ Eye Exam: 06/03/2020 WNL @ [facility]. Follow up in 1 year.  Procedures:  No procedures performed Allergies: Quinolones, Garlic, Latex, Levaquin [levofloxacin], Losartan, Metoprolol, Onion, Penicillins, Percocet [oxycodone-acetaminophen], Sulfa drugs cross reactors, Vicodin [hydrocodone-acetaminophen], and Gluten meal   Assessment / Plan:     Visit Diagnoses: Autoimmune disease ([place]) - ANA 1:320 NO, dsDNA8, RF-,CCP-, History of nasal ulcers and oral ulcers in the past, positive synovitis in left hand MCPs on [place] previously: -She denies history of oral ulcers, nasal ulcers.  She has some sensitivity in her mouth.  She has been using a mouthwash prescribed by her dentist.  She denies any history of inflammation in her joints.  There is no history of Raynaud's phenomenon, photosensitivity or lymphadenopathy.  She has some discomfort in her joints from underlying osteoarthritis.  Plan: Urinalysis, Routine w reflex microscopic, Anti-DNA antibody, double-stranded, C3 and C4, Sedimentation rate, Protein / creatinine ratio, urine  Sicca complex (HCC)-she continues to have dry mouth and dry eyes.  She been using over-the-counter products.  The mild sensitivity could be coming from her dry mouth.I discussed the option of starting pilocarpine.  Indications side effects contraindications were discussed.  Patient was in agreement to start on the medication.  Prescription for pilocarpine 5 mg p.o. 3 times daily as needed was called in for 30-day supply and 2 refills.  High risk medication use - Plaquenil 200 mg 1 tablet by mouth daily twice daily M-F only.  PLQ Eye Exam: 06/03/2020 -labs obtained on Nov 19, 2020 were reviewed which included CBC with differential, CMP with GFR, sedimentation rate, complements, double-stranded DNA and UA.  All the labs were unremarkable except for mild elevation of LFTs and positive double-stranded DNA.  She is on statins which could be contributing to  elevation of her LFTs.  We will repeat labs in 5 months.  Plan: CBC with Differential/Platelet, COMPLETE METABOLIC PANEL WITH GFR, Hydroxychloroquine, Blood  Pain of both elbows-she has had issues with lateral epicondylitis which comes and goes.  She does not have much tenderness on examination today.  She states she has been using braces which has been helpful.  Primary osteoarthritis of both hands-she has bilateral PIP and DIP thickening which contributes to stiffness.  Joint protection muscle strengthening was discussed.  Trochanteric bursitis of both hips-she has intermittent pain.  IT band stretches were discussed.  Primary osteoarthritis of both knees-she continues to have some discomfort.  Lower extremity muscle strength exercises were discussed.  Myofascial muscle pain-she continues to have some generalized pain and discomfort.  Family history of rheumatoid arthritis  Family history of lupus erythematosus  History of hyperlipidemia-she is on Lipitor.  History of hypertension-her blood pressure is normal.  History of gastroesophageal reflux (GERD)-she takes Nexium.  History of anxiety  History of anemia-her hemoglobin is normal range now.  Vitamin D deficiency-vitamin D was 54 on Nov 19, 2020.  Orders: Orders Placed This Encounter  Procedures   CBC with Differential/Platelet   COMPLETE METABOLIC PANEL WITH GFR   Urinalysis, Routine w reflex microscopic   Anti-DNA antibody, double-stranded   C3 and C4   Sedimentation rate   Protein / creatinine ratio, urine   Hydroxychloroquine, Blood    Meds ordered this encounter  Medications   pilocarpine (SALAGEN) 5 MG tablet    Sig: Take 1 tablet (5 mg total) by mouth 3 (three) times daily as needed.    Dispense:  90 tablet    Refill:  2     Follow-Up Instructions: Return in about 5 months (around 09/20/2021) for Systemic lupus.   [name], MD  Note - This record has been created using Editor, commissioning.  Chart  creation errors have been sought, but may not always  have been located. Such creation errors do not reflect on  the standard of medical care.

## 2021-04-20 ENCOUNTER — Other Ambulatory Visit: Payer: Self-pay | Admitting: Medical

## 2021-04-21 ENCOUNTER — Other Ambulatory Visit: Payer: Self-pay | Admitting: Medical

## 2021-04-22 ENCOUNTER — Encounter: Payer: Self-pay | Admitting: Rheumatology

## 2021-04-22 ENCOUNTER — Other Ambulatory Visit: Payer: Self-pay

## 2021-04-22 ENCOUNTER — Ambulatory Visit: Payer: BC Managed Care – PPO | Admitting: Rheumatology

## 2021-04-22 VITALS — BP 116/72 | HR 60 | Resp 14 | Ht 64.0 in | Wt 132.0 lb

## 2021-04-22 DIAGNOSIS — M25521 Pain in right elbow: Secondary | ICD-10-CM

## 2021-04-22 DIAGNOSIS — M170 Bilateral primary osteoarthritis of knee: Secondary | ICD-10-CM

## 2021-04-22 DIAGNOSIS — M359 Systemic involvement of connective tissue, unspecified: Secondary | ICD-10-CM

## 2021-04-22 DIAGNOSIS — M3500 Sicca syndrome, unspecified: Secondary | ICD-10-CM

## 2021-04-22 DIAGNOSIS — Z79899 Other long term (current) drug therapy: Secondary | ICD-10-CM

## 2021-04-22 DIAGNOSIS — Z8659 Personal history of other mental and behavioral disorders: Secondary | ICD-10-CM

## 2021-04-22 DIAGNOSIS — Z8639 Personal history of other endocrine, nutritional and metabolic disease: Secondary | ICD-10-CM

## 2021-04-22 DIAGNOSIS — E559 Vitamin D deficiency, unspecified: Secondary | ICD-10-CM

## 2021-04-22 DIAGNOSIS — M19041 Primary osteoarthritis, right hand: Secondary | ICD-10-CM

## 2021-04-22 DIAGNOSIS — Z862 Personal history of diseases of the blood and blood-forming organs and certain disorders involving the immune mechanism: Secondary | ICD-10-CM

## 2021-04-22 DIAGNOSIS — Z840 Family history of diseases of the skin and subcutaneous tissue: Secondary | ICD-10-CM

## 2021-04-22 DIAGNOSIS — M7918 Myalgia, other site: Secondary | ICD-10-CM

## 2021-04-22 DIAGNOSIS — M7062 Trochanteric bursitis, left hip: Secondary | ICD-10-CM

## 2021-04-22 DIAGNOSIS — Z8261 Family history of arthritis: Secondary | ICD-10-CM

## 2021-04-22 DIAGNOSIS — M19042 Primary osteoarthritis, left hand: Secondary | ICD-10-CM

## 2021-04-22 DIAGNOSIS — M7061 Trochanteric bursitis, right hip: Secondary | ICD-10-CM

## 2021-04-22 DIAGNOSIS — Z8679 Personal history of other diseases of the circulatory system: Secondary | ICD-10-CM

## 2021-04-22 DIAGNOSIS — M25522 Pain in left elbow: Secondary | ICD-10-CM

## 2021-04-22 DIAGNOSIS — Z8719 Personal history of other diseases of the digestive system: Secondary | ICD-10-CM

## 2021-04-22 MED ORDER — PILOCARPINE HCL 5 MG PO TABS: 5.0000 mg | ORAL_TABLET | Freq: Three times a day (TID) | ORAL | 2 refills | Status: DC | PRN

## 2021-04-22 NOTE — Patient Instructions (Signed)
Standing Labs We placed an order today for your standing lab work.   Please have your standing labs drawn in March  If possible, please have your labs drawn 2 weeks prior to your appointment so that the provider can discuss your results at your appointment.  Please note that you may see your imaging and lab results in MyChart before we have reviewed them. We may be awaiting multiple results to interpret others before contacting you. Please allow our office up to 72 hours to thoroughly review all of the results before contacting the office for clarification of your results.  We have open lab daily: Monday through Thursday from 1:30-4:30 PM and Friday from 1:30-4:00 PM at the office of Dr. Bryleigh Ottaway, [facility].   Please be advised, all patients with office appointments requiring lab work will take precedent over walk-in lab work.  If possible, please come for your lab work on Monday and Friday afternoons, as you may experience shorter wait times. The office is located at [address], [address], [place],  [address] No appointment is necessary.   Labs are drawn by [facility]. Please bring your co-pay at the time of your lab draw.  You may receive a bill from [facility] for your lab work.  If you wish to have your labs drawn at another location, please call the office 24 hours in advance to send orders.  If you have any questions regarding directions or hours of operation,  please call [phone].   As a reminder, please drink plenty of water prior to coming for your lab work. Thanks!   Vaccines You are taking a medication(s) that can suppress your immune system.  The following immunizations are recommended: Flu annually Covid-19  Td/Tdap (tetanus, diphtheria, pertussis) every 10 years Pneumonia (Prevnar 15 then Pneumovax 23 at least 1 year apart.  Alternatively, can take Prevnar 20 without needing additional dose) Shingrix: 2 doses from 4 weeks to 6 months  apart  Please check with your PCP to make sure you are up to date.  

## 2021-04-23 NOTE — Progress Notes (Signed)
CBC and CMP normal.  UA is negative.  Double-stranded DNA positive and stable.  Complements and sed rate are normal.  Urine protein creatinine ratio is normal.

## 2021-04-27 ENCOUNTER — Other Ambulatory Visit: Payer: Self-pay | Admitting: Obstetrics and Gynecology

## 2021-04-27 ENCOUNTER — Other Ambulatory Visit: Payer: Self-pay | Admitting: Medical

## 2021-04-27 DIAGNOSIS — N952 Postmenopausal atrophic vaginitis: Secondary | ICD-10-CM

## 2021-04-27 NOTE — Telephone Encounter (Signed)
Hi Shari Lawrence, Your vaginitis panel is negative for an infection.  I have called in vaginal estrogen to your pharmacy. This should make you feel better. Please reach out with any questions. Best, Sumner Boast  Written by [name], MD on 09/17/2020  6:13 PM EDT  Patient is scheduled for AEX 07/20/21.

## 2021-04-29 LAB — COMPLETE METABOLIC PANEL WITH GFR
AG Ratio: 2 (calc) (ref 1.0–2.5)
ALT: 22 U/L (ref 6–29)
AST: 27 U/L (ref 10–35)
Albumin: 4.7 g/dL (ref 3.6–5.1)
Alkaline phosphatase (APISO): 66 U/L (ref 37–153)
BUN: 15 mg/dL (ref 7–25)
CO2: 28 mmol/L (ref 20–32)
Calcium: 10.1 mg/dL (ref 8.6–10.4)
Chloride: 105 mmol/L (ref 98–110)
Creat: 1.03 mg/dL (ref 0.50–1.03)
Globulin: 2.4 g/dL (calc) (ref 1.9–3.7)
Glucose, Bld: 80 mg/dL (ref 65–99)
Potassium: 4.1 mmol/L (ref 3.5–5.3)
Sodium: 141 mmol/L (ref 135–146)
Total Bilirubin: 0.4 mg/dL (ref 0.2–1.2)
Total Protein: 7.1 g/dL (ref 6.1–8.1)
eGFR: 63 mL/min/{1.73_m2} (ref >=60)

## 2021-04-29 LAB — PROTEIN / CREATININE RATIO, URINE
Creatinine, Urine: 102 mg/dL (ref 20–275)
Protein/Creat Ratio: 88 mg/g creat (ref 24–184)
Protein/Creatinine Ratio: 0.088 mg/mg creat (ref 0.024–0.184)
Total Protein, Urine: 9 mg/dL (ref 5–24)

## 2021-04-29 LAB — C3 AND C4
C3 Complement: 131 mg/dL (ref 83–193)
C4 Complement: 24 mg/dL (ref 15–57)

## 2021-04-29 LAB — ANTI-DNA ANTIBODY, DOUBLE-STRANDED: ds DNA Ab: 9 IU/mL — ABNORMAL HIGH

## 2021-04-29 LAB — CBC WITH DIFFERENTIAL/PLATELET
Absolute Monocytes: 400 cells/uL (ref 200–950)
Basophils Absolute: 48 cells/uL (ref 0–200)
Basophils Relative: 1.1 %
Eosinophils Absolute: 88 cells/uL (ref 15–500)
Eosinophils Relative: 2 %
HCT: 37.8 % (ref 35.0–45.0)
Hemoglobin: 12.2 g/dL (ref 11.7–15.5)
Lymphs Abs: 1914 cells/uL (ref 850–3900)
MCH: 30.1 pg (ref 27.0–33.0)
MCHC: 32.3 g/dL (ref 32.0–36.0)
MCV: 93.3 fL (ref 80.0–100.0)
MPV: 12.6 fL — ABNORMAL HIGH (ref 7.5–12.5)
Monocytes Relative: 9.1 %
Neutro Abs: 1949 cells/uL (ref 1500–7800)
Neutrophils Relative %: 44.3 %
Platelets: 171 10*3/uL (ref 140–400)
RBC: 4.05 10*6/uL (ref 3.80–5.10)
RDW: 13.4 % (ref 11.0–15.0)
Total Lymphocyte: 43.5 %
WBC: 4.4 10*3/uL (ref 3.8–10.8)

## 2021-04-29 LAB — URINALYSIS, ROUTINE W REFLEX MICROSCOPIC
Bilirubin Urine: NEGATIVE
Glucose, UA: NEGATIVE
Hgb urine dipstick: NEGATIVE
Ketones, ur: NEGATIVE
Leukocytes,Ua: NEGATIVE
Nitrite: NEGATIVE
Protein, ur: NEGATIVE
Specific Gravity, Urine: 1.018 (ref 1.001–1.035)
pH: 7.5 (ref 5.0–8.0)

## 2021-04-29 LAB — HYDROXYCHLOROQUINE,BLOOD: HYDROXYCHLOROQUINE, (B): 730 ng/mL — ABNORMAL HIGH

## 2021-04-29 LAB — SEDIMENTATION RATE: Sed Rate: 19 mm/h (ref 0–30)

## 2021-04-29 NOTE — Progress Notes (Signed)
Hydroxychloroquine level is below therapeutic range.  Please advise patient to take hydroxychloroquine 200 mg twice daily Monday through Friday on a regular basis.

## 2021-05-06 ENCOUNTER — Other Ambulatory Visit: Payer: Self-pay

## 2021-05-06 ENCOUNTER — Encounter: Payer: Self-pay | Admitting: Medical

## 2021-05-06 ENCOUNTER — Other Ambulatory Visit (INDEPENDENT_AMBULATORY_CARE_PROVIDER_SITE_OTHER): Payer: BC Managed Care – PPO

## 2021-05-06 ENCOUNTER — Telehealth: Payer: BC Managed Care – PPO | Admitting: Medical

## 2021-05-06 VITALS — BP 118/70 | HR 63 | Temp 97.6°F | Wt 130.0 lb

## 2021-05-06 DIAGNOSIS — J029 Acute pharyngitis, unspecified: Secondary | ICD-10-CM

## 2021-05-06 DIAGNOSIS — Z20828 Contact with and (suspected) exposure to other viral communicable diseases: Secondary | ICD-10-CM

## 2021-05-06 DIAGNOSIS — R0981 Nasal congestion: Secondary | ICD-10-CM

## 2021-05-06 DIAGNOSIS — J988 Other specified respiratory disorders: Secondary | ICD-10-CM

## 2021-05-06 LAB — POCT RESPIRATORY SYNCYTIAL VIRUS: RSV Rapid Ag: NEGATIVE

## 2021-05-06 LAB — POC COVID19 BINAXNOW: SARS Coronavirus 2 Ag: NEGATIVE

## 2021-05-06 LAB — POCT INFLUENZA A/B
Influenza A, POC: NEGATIVE
Influenza B, POC: NEGATIVE

## 2021-05-06 MED ORDER — OSELTAMIVIR PHOSPHATE 75 MG PO CAPS: 75.0000 mg | ORAL_CAPSULE | Freq: Every day | ORAL | 0 refills | Status: DC

## 2021-05-06 NOTE — Progress Notes (Signed)
Subjective:     Patient ID: Shari Lawrence, female   DOB: 1961/06/22, 60 y.o.   MRN: [id number]  This visit type was conducted due to national recommendations for restrictions regarding the COVID-19 Pandemic (e.g. social distancing) in an effort to limit this patient's exposure and mitigate transmission in our community.  Due to their co-morbid illnesses, this patient is at least at moderate risk for complications without adequate follow up.  This format is felt to be most appropriate for this patient at this time.    Documentation for virtual audio and video telecommunications through [place] encounter:  The patient was located at home. The provider was located in the office. The patient did consent to this visit and is aware of possible charges through their insurance for this visit.  The other persons participating in this telemedicine service were none. Time spent on call was 20 minutes and in review of previous records 20 minutes total.  This virtual service is not related to other E/M service within previous 7 days.   HPI Chief Complaint  Patient presents with   exposed to flu    Exposure to flu, tests- covid-flu and rsv was negative. Scratchy throat, congestion in head.     Virtual consult for possible illness.  She notes 1 day history of scratchy throat and some head congestion.  Otherwise no symptoms.  No fever, bodies, chills, no nausea vomiting or diarrhea, maybe occasional cough.  No headache.  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  Some had a mask on , some did not.  There is an outbreak of influenza on campus.  She also may have had some COVID positive kids at school as well.  She is using zinc, Mucinex, Sudafed, Benadryl.  Past Medical History:  Diagnosis Date   Abnormal Pap smear 1988   treated with cryo   Anemia 11/2010   hematology consult prior; etiology malabsorption  and uterine bleeding   Anxiety    COVID-19 10/28/2020   Gastric polyp    GERD (gastroesophageal reflux disease)    H/O bone density study 12/2007   H/O hysterectomy for benign disease 5/14   History of echocardiogram 03/2010   normal LV function, EF 60-65%, mild left atrial enlargement   HTN (hypertension) 03/2010   hospitalization for HTN urgency   Hyperlipemia    Internal hemorrhoids    Lumbar degenerative disc disease    Migraine    Myalgia    Numbness and tingling    Prediabetes    Spondylosis, cervical    Umbilical hernia    Uterine fibroid    hx/o    Current Outpatient Medications on File Prior to Visit  Medication Sig Dispense Refill   acetaminophen (TYLENOL) 500 MG tablet Take 1 tablet (500 mg total) by mouth every 6 (six) hours as needed. 30 tablet 0   ALPRAZolam (XANAX) 0.5 MG tablet Take 0.5 mg by mouth as needed for anxiety.     amLODipine (NORVASC) 5 MG tablet TAKE 1 TABLET BY MOUTH EVERY DAY 30 tablet 0   Ascorbic Acid (VITAMIN C) 1000 MG tablet Take 1,000 mg by mouth daily.     aspirin EC 81 MG tablet Take 81 mg by mouth as needed.     atorvastatin (LIPITOR) 20 MG tablet TAKE 1 TABLET BY MOUTH EVERY DAY 30 tablet 0   B Complex Vitamins (B COMPLEX PO) Take 1 tablet by  mouth daily.     calcium carbonate (OS-CAL) 600 MG TABS Take 600 mg by mouth daily.     CINNAMON PO Take 1 tablet by mouth daily.      diclofenac sodium (VOLTAREN) 1 % GEL 3 grams to 3 large joints up to 3 times daily (Patient taking differently: as needed. 3 grams to 3 large joints up to 3 times daily) 3 Tube 3   escitalopram (LEXAPRO) 10 MG tablet Take 0.5 tablets (5 mg total) by mouth daily. 30 tablet 0   estradiol (VIVELLE-DOT) 0.025 MG/24HR Place 1 patch onto the skin 2 (two) times a week.     fish oil-omega-3 fatty acids 1000 MG capsule Take 1 g by mouth daily.     fluticasone (FLONASE) 50 MCG/ACT nasal spray SPRAY 2 SPRAYS INTO EACH NOSTRIL EVERY DAY 16 g 0   hydroxychloroquine (PLAQUENIL) 200  MG tablet TAKE 1 TABLET BY MOUTH TWICE DAILY MONDAY THROUGH FRIDAY ONLY. 120 tablet 0   lubiprostone (AMITIZA) 24 MCG capsule Take 1 capsule (24 mcg total) by mouth 2 (two) times daily with a meal. 180 capsule 1   magic mouthwash SOLN Take 5 mLs by mouth 4 (four) times daily. 240 mL 0   Multiple Vitamin (MULTIVITAMIN) tablet Take 1 tablet by mouth daily.     NEXIUM 40 MG capsule Take 40 mg by mouth daily.      Probiotic Product (PROBIOTIC ADVANCED PO) Take 1 tablet by mouth daily.      TURMERIC PO Take 1 tablet by mouth daily.      BELSOMRA 10 MG TABS Take 1 tablet by mouth at bedtime. (Patient not taking: Reported on 05/06/2021)     Estradiol (YUVAFEM) 10 MCG TABS vaginal tablet Place one tablet vaginally 2 x a week at hs (Patient not taking: Reported on 05/06/2021) 24 tablet 0   pilocarpine (SALAGEN) 5 MG tablet Take 1 tablet (5 mg total) by mouth 3 (three) times daily as needed. (Patient not taking: Reported on 05/06/2021) 90 tablet 2   No current facility-administered medications on file prior to visit.     Review of Systems As in subjective    Objective:   Physical Exam Due to coronavirus pandemic stay at home measures, patient visit was virtual and they were not examined in person.   BP 118/70   Pulse 63   Temp 97.6 F (36.4 C)   Wt 130 lb (59 kg)   LMP 10/25/2012 (Approximate)   SpO2 99%   BMI 22.31 kg/m   Gen:  wd, wn, nad No labored breathing, no witnessed wheezing      Assessment:     Encounter Diagnoses  Name Primary?   Exposure to the flu Yes   Sore throat    Congested nose    Respiratory tract infection        Plan:     Currently symptoms are early in the course and minimal.  However she has had several flu and possibly even COVID exposure.  She tested negative today for flu and COVID and RSV.  Advised rest, hydration, continue possibly Mucinex and either Sudafed or Benadryl but not all 3.  Given exposure she can begin Tamiflu  prophylactically.  Continue supportive measures.  If much worse or new symptoms in the coming days then call or recheck.  Ashyla was seen today for exposed to flu.  Diagnoses and all orders for this visit:  Exposure to the flu  Sore throat  Congested nose  Respiratory tract infection  Other orders -     oseltamivir (TAMIFLU) 75 MG capsule; Take 1 capsule (75 mg total) by mouth daily.   F/u prn

## 2021-05-07 LAB — NOVEL CORONAVIRUS, NAA: SARS-CoV-2, NAA: NOT DETECTED

## 2021-05-07 LAB — SARS-COV-2, NAA 2 DAY TAT

## 2021-05-12 ENCOUNTER — Telehealth: Payer: Self-pay

## 2021-05-12 NOTE — Telephone Encounter (Signed)
Patient Shari Lawrence that she is still having S/S with nasal drainage/congestion has continued to take Sudafed  Called pt back and she asks if she could have had a false negative having tests on Thursday after 2 days of S/S  She has been losing her voice on and off since Friday. She feels dryness in nose. Asks if she should try something else other Mucinex and sudafed.  Is concerned about covid because she has an autoimmune disorder.   Still has not had a fever

## 2021-05-12 NOTE — Telephone Encounter (Signed)
Scheduled nurse visit for covid test 05/13/21 @ 11 am

## 2021-05-12 NOTE — Telephone Encounter (Signed)
Patient notified

## 2021-05-13 ENCOUNTER — Other Ambulatory Visit (INDEPENDENT_AMBULATORY_CARE_PROVIDER_SITE_OTHER): Payer: BC Managed Care – PPO

## 2021-05-13 DIAGNOSIS — R059 Cough, unspecified: Secondary | ICD-10-CM

## 2021-05-13 LAB — POC COVID19 BINAXNOW: SARS Coronavirus 2 Ag: NEGATIVE

## 2021-05-14 LAB — SARS-COV-2, NAA 2 DAY TAT

## 2021-05-14 LAB — NOVEL CORONAVIRUS, NAA: SARS-CoV-2, NAA: NOT DETECTED

## 2021-05-21 ENCOUNTER — Other Ambulatory Visit: Payer: Self-pay | Admitting: Medical

## 2021-06-09 ENCOUNTER — Encounter: Payer: Self-pay | Admitting: Rheumatology

## 2021-06-11 ENCOUNTER — Other Ambulatory Visit: Payer: Self-pay

## 2021-06-11 ENCOUNTER — Other Ambulatory Visit (INDEPENDENT_AMBULATORY_CARE_PROVIDER_SITE_OTHER): Payer: BC Managed Care – PPO

## 2021-06-11 DIAGNOSIS — Z23 Encounter for immunization: Secondary | ICD-10-CM

## 2021-06-29 NOTE — Progress Notes (Signed)
61 y.o. [id number] Divorced Black or [place] American Not Hispanic or Latino female here for annual exam.  H/O hysterectomy. She is on the 0.0375 mg estradiol patch. She has tolerable vasomotor symptoms. Willing to go back down to 0.025 mg patch. She would like to start vaginal estrogen. Not sexually active. Talking with someone, not dating.    She has an autoimmune disorder, osteoarthritis and chronic pain. Rheumatologist is aware she on ERT. Prior testing negative for antiphospholipid antibody syndrome.   No bowel or bladder changes.   Patient's last menstrual period was 10/25/2012 (approximate).          Sexually active: No.  The current method of family planning is status post hysterectomy.    Exercising: Yes.     Walking  Smoker:  no  Health Maintenance: Pap:  04/20/2017 WNL NEG HR HPV History of abnormal Pap:  yes 1988 S/P Cryo  MMG:  12/23/20 density C Bi-rads 1 neg  BMD:   09/05/19 osteopenic T-score -1.2, f/u in 5 years.   Colonoscopy: 02/27/21, diverticulosis, repeat 5 years  TDaP:  02/04/12  Gardasil: n/a    reports that she has never smoked. She has never used smokeless tobacco. She reports that she does not drink alcohol and does not use drugs. 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 Daughter is a Education officer, museum, lives locally.      Past Medical History:  Diagnosis Date   Abnormal Pap smear 1988   treated with cryo   Anemia 11/2010   hematology consult prior; etiology malabsorption and uterine bleeding   Anxiety    COVID-19 10/28/2020   Gastric polyp    GERD (gastroesophageal reflux disease)    H/O bone density study 12/2007   H/O hysterectomy for benign disease 5/14   History of echocardiogram 03/2010   normal LV function, EF 60-65%, mild left atrial enlargement   HTN (hypertension) 03/2010   hospitalization for HTN urgency   Hyperlipemia    Internal hemorrhoids    Lumbar degenerative disc disease    Migraine    Myalgia    Numbness and tingling    Prediabetes    Spondylosis,  cervical    Umbilical hernia    Uterine fibroid    hx/o     Past Surgical History:  Procedure Laterality Date   ABDOMINAL HYSTERECTOMY     CERVICAL FUSION  2010   [place]     CHOLECYSTECTOMY  2003   COLONOSCOPY  08/2010   Dr. Collene Mares   COLONOSCOPY  02/2017   Dr. Collene Mares   ESOPHAGOGASTRODUODENOSCOPY  08/2010   Dr. Collene Mares   EXPLORATORY LAPAROTOMY     HERNIA REPAIR     MYOMECTOMY  1998   PELVIC LAPAROSCOPY     ROBOTIC ASSISTED LAPAROSCOPIC HYSTERECTOMY AND SALPINGECTOMY  10/2012   [facility]; laproscopic due to fibroids   UMBILICAL HERNIA REPAIR  10/2013   infected laparoscopic port, mesh placement    Current Outpatient Medications  Medication Sig Dispense Refill   acetaminophen (TYLENOL) 500 MG tablet Take 1 tablet (500 mg total) by mouth every 6 (six) hours as needed. 30 tablet 0   ALPRAZolam (XANAX) 0.5 MG tablet Take 0.5 mg by mouth as needed for anxiety.     amLODipine (NORVASC) 5 MG tablet TAKE 1 TABLET BY MOUTH EVERY DAY 30 tablet 0   Ascorbic Acid (VITAMIN C) 1000 MG tablet Take 1,000 mg by mouth daily.  aspirin EC 81 MG tablet Take 81 mg by mouth as needed.     atorvastatin (LIPITOR) 20 MG tablet TAKE 1 TABLET BY MOUTH EVERY DAY 30 tablet 0   B Complex Vitamins (B COMPLEX PO) Take 1 tablet by mouth daily.     calcium carbonate (OS-CAL) 600 MG TABS Take 600 mg by mouth daily.     CINNAMON PO Take 1 tablet by mouth daily.      diclofenac sodium (VOLTAREN) 1 % GEL 3 grams to 3 large joints up to 3 times daily (Patient taking differently: as needed. 3 grams to 3 large joints up to 3 times daily) 3 Tube 3   escitalopram (LEXAPRO) 10 MG tablet Take 0.5 tablets (5 mg total) by mouth daily. 30 tablet 0   estradiol (VIVELLE-DOT) 0.025 MG/24HR Place 1 patch onto the skin 2 (two) times a week.     fish oil-omega-3 fatty acids 1000 MG capsule Take 1 g by mouth daily.     fluticasone (FLONASE) 50 MCG/ACT nasal spray SPRAY 2  SPRAYS INTO EACH NOSTRIL EVERY DAY 16 g 0   hydroxychloroquine (PLAQUENIL) 200 MG tablet TAKE 1 TABLET BY MOUTH TWICE DAILY MONDAY THROUGH FRIDAY ONLY. 120 tablet 0   lubiprostone (AMITIZA) 24 MCG capsule Take 1 capsule (24 mcg total) by mouth 2 (two) times daily with a meal. 180 capsule 1   magic mouthwash SOLN Take 5 mLs by mouth 4 (four) times daily. 240 mL 0   Multiple Vitamin (MULTIVITAMIN) tablet Take 1 tablet by mouth daily.     NEXIUM 40 MG capsule Take 40 mg by mouth daily.      Probiotic Product (PROBIOTIC ADVANCED PO) Take 1 tablet by mouth daily.      TURMERIC PO Take 1 tablet by mouth daily.      pilocarpine (SALAGEN) 5 MG tablet Take 1 tablet (5 mg total) by mouth 3 (three) times daily as needed. (Patient not taking: Reported on 06/30/2021) 90 tablet 2   No current facility-administered medications for this visit.    Family History  Problem Relation Age of Onset   Hypertension Mother    Dementia Mother    Colon cancer Mother 85   Deep vein thrombosis Father    Hypertension Father    Cancer Father        prostate CA   Heart disease Father 40       CAD   Parkinsonism Father    Hypertension Sister    Hyperlipidemia Sister    Sjogren's syndrome Sister    Hypertension Brother    Hyperlipidemia Brother    Hypertension Maternal Aunt    Hyperlipidemia Maternal Aunt    Rheum arthritis Maternal Aunt    Diabetes Maternal Aunt        1 with type II, 1 with type 1   Hypertension Brother    Rheum arthritis Brother    Diabetes Paternal Aunt        type II   Breast cancer Neg Hx     Review of Systems  All other systems reviewed and are negative.  Exam:   BP 122/64    Pulse 72    Ht 5\' 4"  (1.626 m)    Wt 134 lb (60.8 kg)    LMP 10/25/2012 (Approximate)    SpO2 99%    BMI 23.00 kg/m   Weight change: @WEIGHTCHANGE @ Height:   Height: 5\' 4"  (162.6 cm)  Ht Readings from Last 3 Encounters:  06/30/21 5\' 4"  (1.626 m)  04/22/21 5\' 4"  (1.626 m)  01/05/21 5\' 4"  (1.626 m)     General appearance: alert, cooperative and appears stated age Head: Normocephalic, without obvious abnormality, atraumatic Neck: no adenopathy, supple, symmetrical, trachea midline and thyroid normal to inspection and palpation Lungs: clear to auscultation bilaterally Cardiovascular: regular rate and rhythm Breasts: normal appearance, no masses or tenderness Abdomen: soft, non-tender; non distended,  no masses,  no organomegaly Extremities: extremities normal, atraumatic, no cyanosis or edema Skin: Skin color, texture, turgor normal. No rashes or lesions Lymph nodes: Cervical, supraclavicular, and axillary nodes normal. No abnormal inguinal nodes palpated Neurologic: Grossly normal   Pelvic: External genitalia:  no lesions              Urethra:  normal appearing urethra with no masses, tenderness or lesions              Bartholins and Skenes: normal                 Vagina: mildly atrophic appearing vagina with normal color and discharge, no lesions              Cervix: absent               Bimanual Exam:  Uterus:  uterus absent              Adnexa: no mass, fullness, tenderness               Rectovaginal: Confirms               Anus:  normal sphincter tone, no lesions  Gae Dry chaperoned for the exam.  1. Well woman exam Discussed breast self exam Discussed calcium and vit D intake Labs with primary/rheumatology Mammogram in 6/23 Colonoscopy UTD DEXA UTD  2. Encounter for monitoring postmenopausal estrogen replacement therapy She is aware of the risks. Will decrease to the 0.025 mg patch - estradiol (VIVELLE-DOT) 0.025 MG/24HR; Place 1 patch onto the skin 2 (two) times a week.  Dispense: 24 patch; Refill: 3  3. Screening for vaginal cancer - Cytology - PAP  4. Vaginal atrophy - Estradiol 10 MCG TABS vaginal tablet; Place 1 tablet (10 mcg total) vaginally 2 (two) times a week.  Dispense: 8 tablet; Refill: 11

## 2021-06-30 ENCOUNTER — Encounter: Payer: Self-pay | Admitting: Obstetrics and Gynecology

## 2021-06-30 ENCOUNTER — Ambulatory Visit (INDEPENDENT_AMBULATORY_CARE_PROVIDER_SITE_OTHER): Payer: BC Managed Care – PPO | Admitting: Obstetrics and Gynecology

## 2021-06-30 ENCOUNTER — Other Ambulatory Visit (HOSPITAL_COMMUNITY)
Admission: RE | Admit: 2021-06-30 | Discharge: 2021-06-30 | Disposition: A | Discharge status: Home / Self Care | Payer: BC Managed Care – PPO | Source: Ambulatory Visit | Attending: Obstetrics and Gynecology | Admitting: Obstetrics and Gynecology

## 2021-06-30 ENCOUNTER — Other Ambulatory Visit: Payer: Self-pay

## 2021-06-30 VITALS — BP 122/64 | HR 72 | Ht 64.0 in | Wt 134.0 lb

## 2021-06-30 DIAGNOSIS — Z01419 Encounter for gynecological examination (general) (routine) without abnormal findings: Secondary | ICD-10-CM | POA: Diagnosis not present

## 2021-06-30 DIAGNOSIS — N952 Postmenopausal atrophic vaginitis: Secondary | ICD-10-CM

## 2021-06-30 DIAGNOSIS — Z79890 Hormone replacement therapy (postmenopausal): Secondary | ICD-10-CM

## 2021-06-30 DIAGNOSIS — Z1272 Encounter for screening for malignant neoplasm of vagina: Secondary | ICD-10-CM

## 2021-06-30 DIAGNOSIS — Z5181 Encounter for therapeutic drug level monitoring: Secondary | ICD-10-CM

## 2021-06-30 MED ORDER — ESTRADIOL 0.025 MG/24HR TD PTTW: 1.0000 | MEDICATED_PATCH | TRANSDERMAL | 3 refills | Status: DC

## 2021-06-30 MED ORDER — ESTRADIOL 10 MCG VA TABS: 1.0000 | ORAL_TABLET | VAGINAL | 11 refills | Status: DC

## 2021-06-30 NOTE — Patient Instructions (Signed)

## 2021-07-03 ENCOUNTER — Other Ambulatory Visit: Payer: Self-pay | Admitting: Medical

## 2021-07-05 LAB — CYTOLOGY - PAP
Comment: NEGATIVE
Diagnosis: NEGATIVE
High risk HPV: NEGATIVE

## 2021-07-23 ENCOUNTER — Other Ambulatory Visit: Payer: Self-pay | Admitting: Physician Assistant

## 2021-07-23 DIAGNOSIS — M359 Systemic involvement of connective tissue, unspecified: Secondary | ICD-10-CM

## 2021-07-23 NOTE — Telephone Encounter (Signed)
Next Visit: 09/21/2021  Last Visit: 04/22/2021  Labs: 04/22/2021 CBC and CMP normal.   Eye exam: 06/09/2021 WNL    Current Dose per office note 04/22/2021:  Plaquenil 200 mg 1 tablet by mouth daily twice daily M-F only  XQ:[id number] disease   Last Fill: 01/29/2021  Okay to refill Plaquenil?

## 2021-07-29 ENCOUNTER — Other Ambulatory Visit: Payer: Self-pay | Admitting: Medical

## 2021-07-30 ENCOUNTER — Telehealth: Payer: Self-pay | Admitting: *Deleted

## 2021-07-30 NOTE — Telephone Encounter (Signed)
Patient called left message in triage voicemail c/o light blood when wiping. I left message for her to call.

## 2021-07-30 NOTE — Telephone Encounter (Signed)
Patient called back reports she is nervous reports pink when wiping after using bathroom. She states its not everytime, however she noticed x 2 weeks now. Reports she does not have uterus and should not have spotting. I recommend she schedule OV with provider, patient said she left message for appointments to call. I told her I would make sure they call her to schedule.

## 2021-08-02 ENCOUNTER — Encounter: Payer: Self-pay | Admitting: Obstetrics and Gynecology

## 2021-08-02 ENCOUNTER — Other Ambulatory Visit: Payer: Self-pay

## 2021-08-02 ENCOUNTER — Ambulatory Visit: Payer: BC Managed Care – PPO | Admitting: Obstetrics and Gynecology

## 2021-08-02 VITALS — BP 100/64 | HR 58 | Temp 98.0°F | Wt 137.0 lb

## 2021-08-02 DIAGNOSIS — R310 Gross hematuria: Secondary | ICD-10-CM

## 2021-08-02 DIAGNOSIS — N952 Postmenopausal atrophic vaginitis: Secondary | ICD-10-CM | POA: Diagnosis not present

## 2021-08-02 NOTE — Progress Notes (Signed)
GYNECOLOGY  VISIT   HPI: 61 y.o.   Divorced Black or [place] American Not Hispanic or Latino  female   [id number] with Patient's last menstrual period was 10/25/2012 (approximate).   here c/o seeing blood when she wiped over the course of several days last week. Not sure where the blood was coming from. She is not having any other uti symptoms, no vaginitis symptoms. H/O hysterectomy. She started vaginal estrogen a week or so prior to noticing the bleeding. No pain.  GYNECOLOGIC HISTORY: Patient's last menstrual period was 10/25/2012 (approximate). Contraception hysterectomy  Menopausal hormone therapy: none         OB History     Gravida  1   Para  1   Term  1   Preterm  0   AB  0   Living  1      SAB  0   IAB  0   Ectopic  0   Multiple  0   Live Births  1              Patient Active Problem List   Diagnosis Date Noted   Chronic right-sided thoracic back pain 07/28/2020   Cervicalgia 07/28/2020   Prediabetes    Facial numbness 05/04/2020   Leg numbness 05/04/2020   Knee swelling 05/04/2020   Facial pain 05/04/2020   Ear pain, left 05/04/2020   History of recent stressful life event 11/20/2019   Panic attack 11/15/2019   Fall 11/13/2019   Pain in both hands 11/13/2019   Sleep disturbance 11/13/2019   Grief 11/13/2019   Bilateral carpal tunnel syndrome 08/21/2019   Chronic pain of both knees 06/14/2019   Right arm pain 06/14/2019   Close exposure to COVID-19 virus 06/14/2019   Paresthesia 04/08/2019   Neck pain 03/30/2019   Radicular pain in right arm 03/30/2019   Numbness of leg 03/30/2019   Chest pain 07/24/2018   Hypotension 07/17/2018   Vertigo 07/17/2018   Nausea 07/17/2018   Vaccine counseling 05/21/2018   Need for influenza vaccination 05/21/2018   Routine general medical examination at a health care facility 05/21/2018   Caregiver burden 07/05/2017   Cyst of skin 07/05/2017   Elevated blood pressure reading 06/28/2017   Leg pain,  diffuse, left 06/28/2017   Leg swelling 06/28/2017   Bruising 06/28/2017   Myofascial muscle pain 06/28/2017   High risk medication use 03/30/2017   Chronic constipation 11/17/2016   Globus sensation 11/17/2016   Primary osteoarthritis of both knees 10/05/2016   Autoimmune disease ([place]) 09/29/2016   Primary osteoarthritis of both hands 09/29/2016   Family history of lupus erythematosus 09/20/2016   Polyarthralgia 05/06/2016   Clenching of teeth 05/06/2016   Finger swelling 05/06/2016   Morning stiffness of joints 05/06/2016   Myalgia 05/06/2016   Family history of rheumatoid arthritis 05/06/2016   Essential hypertension 12/23/2014   History of anemia 12/23/2014   Care provider for parents 12/23/2014   Post-operative state 11/13/2012   Hyperlipidemia 11/11/2012   Hypertension 11/11/2012   Impaired fasting glucose 10/25/2011   Gluten intolerance 10/25/2011   Gastroesophageal reflux disease without esophagitis 10/25/2011   Anxiety    Iron deficiency anemia     Past Medical History:  Diagnosis Date   Abnormal Pap smear 1988   treated with cryo   Anemia 11/2010   hematology consult prior; etiology malabsorption and uterine bleeding   Anxiety    COVID-19 10/28/2020   Gastric polyp    GERD (gastroesophageal reflux disease)  H/O bone density study 12/2007   H/O hysterectomy for benign disease 5/14   History of echocardiogram 03/2010   normal LV function, EF 60-65%, mild left atrial enlargement   HTN (hypertension) 03/2010   hospitalization for HTN urgency   Hyperlipemia    Internal hemorrhoids    Lumbar degenerative disc disease    Migraine    Myalgia    Numbness and tingling    Prediabetes    Spondylosis, cervical    Umbilical hernia    Uterine fibroid    hx/o     Past Surgical History:  Procedure Laterality Date   ABDOMINAL HYSTERECTOMY     CERVICAL FUSION  2010   [place]      CHOLECYSTECTOMY  2003   COLONOSCOPY  08/2010   Dr. Collene Mares   COLONOSCOPY  02/2017   Dr. Collene Mares   ESOPHAGOGASTRODUODENOSCOPY  08/2010   Dr. Collene Mares   EXPLORATORY LAPAROTOMY     HERNIA REPAIR     MYOMECTOMY  1998   PELVIC LAPAROSCOPY     ROBOTIC ASSISTED LAPAROSCOPIC HYSTERECTOMY AND SALPINGECTOMY  10/2012   [facility]; laproscopic due to fibroids   UMBILICAL HERNIA REPAIR  10/2013   infected laparoscopic port, mesh placement    Current Outpatient Medications  Medication Sig Dispense Refill   acetaminophen (TYLENOL) 500 MG tablet Take 1 tablet (500 mg total) by mouth every 6 (six) hours as needed. 30 tablet 0   ALPRAZolam (XANAX) 0.5 MG tablet Take 0.5 mg by mouth as needed for anxiety.     amLODipine (NORVASC) 5 MG tablet TAKE 1 TABLET BY MOUTH EVERY DAY 30 tablet 0   Ascorbic Acid (VITAMIN C) 1000 MG tablet Take 1,000 mg by mouth daily.     atorvastatin (LIPITOR) 20 MG tablet TAKE 1 TABLET BY MOUTH EVERY DAY 30 tablet 0   B Complex Vitamins (B COMPLEX PO) Take 1 tablet by mouth daily.     calcium carbonate (OS-CAL) 600 MG TABS Take 600 mg by mouth daily.     CINNAMON PO Take 1 tablet by mouth daily.      diclofenac sodium (VOLTAREN) 1 % GEL 3 grams to 3 large joints up to 3 times daily (Patient taking differently: as needed. 3 grams to 3 large joints up to 3 times daily) 3 Tube 3   escitalopram (LEXAPRO) 10 MG tablet Take 0.5 tablets (5 mg total) by mouth daily. 30 tablet 0   estradiol (VIVELLE-DOT) 0.025 MG/24HR Place 1 patch onto the skin 2 (two) times a week. 24 patch 3   Estradiol 10 MCG TABS vaginal tablet Place 1 tablet (10 mcg total) vaginally 2 (two) times a week. 8 tablet 11   fish oil-omega-3 fatty acids 1000 MG capsule Take 1 g by mouth daily.     fluticasone (FLONASE) 50 MCG/ACT nasal spray SPRAY 2 SPRAYS INTO EACH NOSTRIL EVERY DAY 16 g 0   hydroxychloroquine (PLAQUENIL) 200 MG tablet TAKE ONE TABLET BY MOUTH TWICE DAILY MONDAY- FRIDAY ONLY 120 tablet 0   lubiprostone (AMITIZA) 24 MCG  capsule Take 1 capsule (24 mcg total) by mouth 2 (two) times daily with a meal. 180 capsule 1   magic mouthwash SOLN Take 5 mLs by mouth 4 (four) times daily. 240 mL 0   Multiple Vitamin (MULTIVITAMIN) tablet Take 1 tablet by mouth daily.     NEXIUM 40 MG capsule Take 40 mg by mouth daily.  Probiotic Product (PROBIOTIC ADVANCED PO) Take 1 tablet by mouth daily.      TURMERIC PO Take 1 tablet by mouth daily.      pilocarpine (SALAGEN) 5 MG tablet Take 1 tablet (5 mg total) by mouth 3 (three) times daily as needed. (Patient not taking: Reported on 06/30/2021) 90 tablet 2   No current facility-administered medications for this visit.     ALLERGIES: Quinolones, Garlic, Latex, Levaquin [levofloxacin], Losartan, Metoprolol, Onion, Penicillins, Percocet [oxycodone-acetaminophen], Sulfa drugs cross reactors, Vicodin [hydrocodone-acetaminophen], and Gluten meal  Family History  Problem Relation Age of Onset   Hypertension Mother    Dementia Mother    Colon cancer Mother 29   Deep vein thrombosis Father    Hypertension Father    Cancer Father        prostate CA   Heart disease Father 85       CAD   Parkinsonism Father    Hypertension Sister    Hyperlipidemia Sister    Sjogren's syndrome Sister    Hypertension Brother    Hyperlipidemia Brother    Hypertension Maternal Aunt    Hyperlipidemia Maternal Aunt    Rheum arthritis Maternal Aunt    Diabetes Maternal Aunt        1 with type II, 1 with type 1   Hypertension Brother    Rheum arthritis Brother    Diabetes Paternal Aunt        type II   Breast cancer Neg Hx     Social History   Socioeconomic History   Marital status: Divorced    Spouse name: Not on file   Number of children: 1   Years of education: college   Highest education level: Doctorate  Occupational History    Employer: [employer]    Comment: molecular Production designer, theatre/television/film, PhD candidate, [employer]  Tobacco Use   Smoking status: Never   Smokeless tobacco:  Never  Vaping Use   Vaping Use: Never used  Substance and Sexual Activity   Alcohol use: No   Drug use: No   Sexual activity: Not Currently    Partners: Male    Birth control/protection: Surgical    Comment: hysterectomy  Other Topics Concern   Not on file  Social History Narrative   Single, 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  Has 43 yo daughter.  Her siblings and her don't agree on helping take care of their parents, causes family tension.  As of 11/2014      Right-handed.   0.5 cup caffeine per day.   Her father lives with her.      Update 09/2019--father passed away from urosepsis in 2019/10/01   Social Determinants of Health   Financial Resource Strain: Not on file  Food Insecurity: Not on file  Transportation Needs: Not on file  Physical Activity: Not on file  Stress: Not on file  Social Connections: Not on file  Intimate Partner Violence: Not on file    Review of Systems  Genitourinary:  Positive for hematuria.  All other systems reviewed and are negative.  PHYSICAL EXAMINATION:    BP 100/64    Pulse (!) 58    Temp 98 F (36.7 C)    Wt 137 lb (62.1 kg)    LMP 10/25/2012 (Approximate)    SpO2 100%  BMI 23.52 kg/m     General appearance: alert, cooperative and appears stated age  Pelvic: External genitalia:  no lesions              Urethra:  normal appearing urethra with no masses, tenderness or lesions, not erythematous              Bartholins and Skenes: normal                 Vagina: mildly atrophic appearing vagina with lesions or lacerations              Cervix: absent              Bimanual Exam:  Uterus:  uterus absent              Adnexa: no mass, fullness, tenderness                Chaperone was present for exam.  1. Gross hematuria Not clear if the blood was  coming from her bladder or her vagina.  - Urinalysis,Complete w/RFL Culture: ua with 0-2 RBC per HPF.   2. Vaginal atrophy Improving with vaginal estrogen, no signs of injury, no lesions, no source of bleeding seen -If her urine is negative and the bleeding returns, she should be seen -Continue with the vaginal estrogen.

## 2021-08-04 LAB — URINE CULTURE
MICRO NUMBER:: 12967885
SPECIMEN QUALITY:: ADEQUATE

## 2021-08-04 LAB — URINALYSIS, COMPLETE W/RFL CULTURE
Bilirubin Urine: NEGATIVE
Glucose, UA: NEGATIVE
Hyaline Cast: NONE SEEN /LPF
Leukocyte Esterase: NEGATIVE
Nitrites, Initial: NEGATIVE
Specific Gravity, Urine: 1.027 (ref 1.001–1.035)
pH: 6 (ref 5.0–8.0)

## 2021-08-04 LAB — CULTURE INDICATED

## 2021-08-05 ENCOUNTER — Telehealth: Payer: Self-pay | Admitting: Rheumatology

## 2021-08-05 NOTE — Telephone Encounter (Signed)
Patient called the office stating she is having pain from the inside of her right knee. Patient states she cannot bend it to sit. Patient states she fell on Sunday but this has been bothering her since before then. Patient states it hurts on the back outside of knee. Patient states she has not tried anything yet to help. Patient states she had to stand all day at work and is able to walk. Patient requests a call back. Patient states she would see her orthopedic doctor but they are no longer in network with her insurance and has not yet gone anywhere else.

## 2021-08-05 NOTE — Telephone Encounter (Signed)
Patient advised she should orthopedics especially since she has had a fall. Patient advised since we are in network then [facility] should be as well. Patient states she saw Dr. Junius Roads there in the past and he is no longer there. Patient states she will call tomorrow to have them schedule her an appointment with another doctor.

## 2021-09-07 ENCOUNTER — Ambulatory Visit (INDEPENDENT_AMBULATORY_CARE_PROVIDER_SITE_OTHER): Payer: BC Managed Care – PPO

## 2021-09-07 ENCOUNTER — Encounter: Payer: Self-pay | Admitting: Orthopaedic Surgery

## 2021-09-07 ENCOUNTER — Ambulatory Visit: Payer: Self-pay

## 2021-09-07 ENCOUNTER — Ambulatory Visit (INDEPENDENT_AMBULATORY_CARE_PROVIDER_SITE_OTHER): Payer: BC Managed Care – PPO | Admitting: Orthopaedic Surgery

## 2021-09-07 ENCOUNTER — Other Ambulatory Visit: Payer: Self-pay

## 2021-09-07 DIAGNOSIS — G8929 Other chronic pain: Secondary | ICD-10-CM

## 2021-09-07 DIAGNOSIS — M25561 Pain in right knee: Secondary | ICD-10-CM

## 2021-09-07 DIAGNOSIS — M25562 Pain in left knee: Secondary | ICD-10-CM

## 2021-09-07 MED ORDER — MELOXICAM 7.5 MG PO TABS: 7.5000 mg | ORAL_TABLET | Freq: Two times a day (BID) | ORAL | 2 refills | Status: DC | PRN

## 2021-09-07 NOTE — Progress Notes (Signed)
? ?Office Visit Note ?  ?Patient: Shari Lawrence           ?Date of Birth: [birth year]           ?MRN: [id number] ?Visit Date: 09/07/2021 ?             ?Requested by: Shari Hurl, PA-C ?[address] ?[place],  [place] [id number] ?PCP: Shari Hurl, PA-C ? ? ?Assessment & Plan: ?Visit Diagnoses:  ?1. Chronic pain of both knees   ? ? ?Plan: Impression is bilateral knee pain.  This may be exacerbated by underlying autoimmune disorder.  Treatment options discussed and we agreed to start with meloxicam.  She may want to follow-up with Shari Lawrence sure if she continues to have pain and may want to consider cortisone injections.   ? ?Follow-Up Instructions: No follow-ups on file.  ? ?Orders:  ?Orders Placed This Encounter  ?Procedures  ? XR KNEE 3 VIEW LEFT  ? XR KNEE 3 VIEW RIGHT  ? ?Meds ordered this encounter  ?Medications  ? meloxicam (MOBIC) 7.5 MG tablet  ?  Sig: Take 1 tablet (7.5 mg total) by mouth 2 (two) times daily as needed for pain.  ?  Dispense:  30 tablet  ?  Refill:  2  ? ? ? ? Procedures: ?No procedures performed ? ? ?Clinical Data: ?No additional findings. ? ? ?Subjective: ?Chief Complaint  ?Patient presents with  ? Right Knee - Pain  ? Left Knee - Pain  ? ? ?HPI ? ?Shari Lawrence is a 61 year old female here for evaluation of bilateral knee pain for about a month.  Right knee is worse.  The knees feel heavy.  She has undiagnosed autoimmune disorder and she sees Dr. Dimas Lawrence.  Takes Plaquenil.  She feels burning pain that radiates up the lateral aspect of the knee.  Feels grinding behind the patella.  Worse with stairs. ? ?Review of Systems  ?Constitutional: Negative.   ?HENT: Negative.    ?Eyes: Negative.   ?Respiratory: Negative.    ?Cardiovascular: Negative.   ?Endocrine: Negative.   ?Musculoskeletal: Negative.   ?Neurological: Negative.   ?Hematological: Negative.   ?Psychiatric/Behavioral: Negative.    ?All other systems reviewed and are negative. ? ? ?Objective: ?Vital Signs: LMP  10/25/2012 (Approximate)  ? ?Physical Exam ?Vitals and nursing note reviewed.  ?Constitutional:   ?   Appearance: She is well-developed.  ?Pulmonary:  ?   Effort: Pulmonary effort is normal.  ?Skin: ?   General: Skin is warm.  ?   Capillary Refill: Capillary refill takes less than 2 seconds.  ?Neurological:  ?   Mental Status: She is alert and oriented to person, place, and time.  ?Psychiatric:     ?   Behavior: Behavior normal.     ?   Thought Content: Thought content normal.     ?   Judgment: Judgment normal.  ? ? ?Ortho Exam ? ?Examination of the bilateral knees show no joint effusion.  1+ patellofemoral crepitus with range of motion.  Collaterals cruciates are stable.  No joint line tenderness.  Slight tenderness along the IT band. ? ?Specialty Comments:  ?No specialty comments available. ? ?Imaging: ?XR KNEE 3 VIEW LEFT ? ?Result Date: 09/07/2021 ?Mild osteoarthritis with periarticular spurring. ? ?XR KNEE 3 VIEW RIGHT ? ?Result Date: 09/07/2021 ?Mild osteoarthritis with periarticular spurring.  ? ? ?PMFS History: ?Patient Active Problem List  ? Diagnosis Date Noted  ? Chronic right-sided thoracic back pain 07/28/2020  ? Cervicalgia 07/28/2020  ?  Prediabetes   ? Facial numbness 05/04/2020  ? Leg numbness 05/04/2020  ? Knee swelling 05/04/2020  ? Facial pain 05/04/2020  ? Ear pain, left 05/04/2020  ? History of recent stressful life event 11/20/2019  ? Panic attack 11/15/2019  ? Fall 11/13/2019  ? Pain in both hands 11/13/2019  ? Sleep disturbance 11/13/2019  ? Grief 11/13/2019  ? Bilateral carpal tunnel syndrome 08/21/2019  ? Chronic pain of both knees 06/14/2019  ? Right arm pain 06/14/2019  ? Close exposure to COVID-19 virus 06/14/2019  ? Paresthesia 04/08/2019  ? Neck pain 03/30/2019  ? Radicular pain in right arm 03/30/2019  ? Numbness of leg 03/30/2019  ? Chest pain 07/24/2018  ? Hypotension 07/17/2018  ? Vertigo 07/17/2018  ? Nausea 07/17/2018  ? Vaccine counseling 05/21/2018  ? Need for influenza  vaccination 05/21/2018  ? Routine general medical examination at a health care facility 05/21/2018  ? Caregiver burden 07/05/2017  ? Cyst of skin 07/05/2017  ? Elevated blood pressure reading 06/28/2017  ? Leg pain, diffuse, left 06/28/2017  ? Leg swelling 06/28/2017  ? Bruising 06/28/2017  ? Myofascial muscle pain 06/28/2017  ? High risk medication use 03/30/2017  ? Chronic constipation 11/17/2016  ? Globus sensation 11/17/2016  ? Primary osteoarthritis of both knees 10/05/2016  ? Autoimmune disease ([place]) 09/29/2016  ? Primary osteoarthritis of both hands 09/29/2016  ? Family history of lupus erythematosus 09/20/2016  ? Polyarthralgia 05/06/2016  ? Clenching of teeth 05/06/2016  ? Finger swelling 05/06/2016  ? Morning stiffness of joints 05/06/2016  ? Myalgia 05/06/2016  ? Family history of rheumatoid arthritis 05/06/2016  ? Essential hypertension 12/23/2014  ? History of anemia 12/23/2014  ? Care provider for parents 12/23/2014  ? Post-operative state 11/13/2012  ? Hyperlipidemia 11/11/2012  ? Hypertension 11/11/2012  ? Impaired fasting glucose 10/25/2011  ? Gluten intolerance 10/25/2011  ? Gastroesophageal reflux disease without esophagitis 10/25/2011  ? Anxiety   ? Iron deficiency anemia   ? ?Past Medical History:  ?Diagnosis Date  ? Abnormal Pap smear 1988  ? treated with cryo  ? Anemia 11/2010  ? hematology consult prior; etiology malabsorption and uterine bleeding  ? Anxiety   ? COVID-19 10/28/2020  ? Gastric polyp   ? GERD (gastroesophageal reflux disease)   ? H/O bone density study 12/2007  ? H/O hysterectomy for benign disease 5/14  ? History of echocardiogram 03/2010  ? normal LV function, EF 60-65%, mild left atrial enlargement  ? HTN (hypertension) 03/2010  ? hospitalization for HTN urgency  ? Hyperlipemia   ? Internal hemorrhoids   ? Lumbar degenerative disc disease   ? Migraine   ? Myalgia   ? Numbness and tingling   ? Prediabetes   ? Spondylosis, cervical   ? Umbilical hernia   ? Uterine fibroid   ?  hx/o   ?  ?Family History  ?Problem Relation Age of Onset  ? Hypertension Mother   ? Dementia Mother   ? Colon cancer Mother 73  ? Deep vein thrombosis Father   ? Hypertension Father   ? Cancer Father   ?     prostate CA  ? Heart disease Father 84  ?     CAD  ? Parkinsonism Father   ? Hypertension Sister   ? Hyperlipidemia Sister   ? Sjogren's syndrome Sister   ? Hypertension Brother   ? Hyperlipidemia Brother   ? Hypertension Maternal Aunt   ? Hyperlipidemia Maternal Aunt   ? Rheum arthritis Maternal  Aunt   ? Diabetes Maternal Aunt   ?     1 with type II, 1 with type 1  ? Hypertension Brother   ? Rheum arthritis Brother   ? Diabetes Paternal Aunt   ?     type II  ? Breast cancer Neg Hx   ?  ?Past Surgical History:  ?Procedure Laterality Date  ? ABDOMINAL HYSTERECTOMY    ? CERVICAL FUSION  2010  ? CERVICAL SPINE SURGERY    ? [place]  ? CESAREAN SECTION    ? CHOLECYSTECTOMY  2003  ? COLONOSCOPY  08/2010  ? Dr. Collene Mares  ? COLONOSCOPY  02/2017  ? Dr. Collene Mares  ? ESOPHAGOGASTRODUODENOSCOPY  08/2010  ? Dr. Collene Mares  ? EXPLORATORY LAPAROTOMY    ? HERNIA REPAIR    ? MYOMECTOMY  1998  ? PELVIC LAPAROSCOPY    ? ROBOTIC ASSISTED LAPAROSCOPIC HYSTERECTOMY AND SALPINGECTOMY  10/2012  ? [facility]; laproscopic due to fibroids  ? UMBILICAL HERNIA REPAIR  10/2013  ? infected laparoscopic port, mesh placement  ? ?Social History  ? ?Occupational History  ?  Employer: [employer]  ?  Comment: molecular Production designer, theatre/television/film, PhD candidate, [employer]  ?Tobacco Use  ? Smoking status: Never  ? Smokeless tobacco: Never  ?Vaping Use  ? Vaping Use: Never used  ?Substance and Sexual Activity  ? Alcohol use: No  ? Drug use: No  ? Sexual activity: Not Currently  ?  Partners: Male  ?  Birth control/protection: Surgical  ?  Comment: hysterectomy  ? ? ? ? ? ? ?

## 2021-09-07 NOTE — Progress Notes (Signed)
Office Visit Note  Patient: Shari Lawrence             Date of Birth: 1960/06/30           MRN: [id number]             PCP: Jac Canavan, PA-C Referring: Jac Canavan, PA-C Visit Date: 09/21/2021 Occupation: @GUAROCC @  Subjective:  Medication management, bilateral knee joint pain  History of Present Illness: Shari Lawrence is a 61 y.o. female with history of autoimmune disease, sicca symptoms, osteoarthritis, myofascial pain syndrome.  She states for the last 2 months she has been having increased pain and discomfort in her bilateral knee joints.  She was seen by Dr. Deno Etienne in March who did x-rays of her bilateral knee joints.  X-rays of the knee joint showed mild osteoarthritis.  According to Dr. Deno Etienne the x-rays did not explain the extent of discomfort she has been experiencing.  She continues to have some discomfort in her elbows and her hands.  Trochanteric bursitis has improved.  Her sicca symptoms are manageable with over-the-counter products.  She did not try pilocarpine.  Activities of Daily Living:  Patient reports morning stiffness for 30 minutes.   Patient Reports nocturnal pain.  Difficulty dressing/grooming: Denies Difficulty climbing stairs: Reports Difficulty getting out of chair: Reports Difficulty using hands for taps, buttons, cutlery, and/or writing: Reports  Review of Systems  Constitutional:  Positive for fatigue.  HENT:  Positive for mouth dryness. Negative for mouth sores and nose dryness.   Eyes:  Positive for pain and dryness. Negative for itching.  Respiratory:  Negative for shortness of breath and difficulty breathing.   Cardiovascular:  Negative for chest pain and palpitations.  Gastrointestinal:  Positive for constipation. Negative for blood in stool and diarrhea.  Endocrine: Negative for increased urination.  Genitourinary:  Negative for difficulty urinating.  Musculoskeletal:  Positive for joint pain, joint pain, myalgias, morning  stiffness, muscle tenderness and myalgias. Negative for joint swelling.  Skin:  Negative for color change, rash and redness.  Allergic/Immunologic: Negative for susceptible to infections.  Neurological:  Positive for dizziness. Negative for numbness, headaches, memory loss and weakness.  Hematological:  Negative for bruising/bleeding tendency.  Psychiatric/Behavioral:  Negative for confusion.    PMFS History:  Patient Active Problem List   Diagnosis Date Noted   Scoliosis 09/14/2021   Chronic right-sided thoracic back pain 07/28/2020   Cervicalgia 07/28/2020   Prediabetes    Facial numbness 05/04/2020   Leg numbness 05/04/2020   Knee swelling 05/04/2020   Facial pain 05/04/2020   Ear pain, left 05/04/2020   History of recent stressful life event 11/20/2019   Panic attack 11/15/2019   Fall 11/13/2019   Pain in both hands 11/13/2019   Sleep disturbance 11/13/2019   Grief 11/13/2019   Bilateral carpal tunnel syndrome 08/21/2019   Chronic pain of both knees 06/14/2019   Right arm pain 06/14/2019   Close exposure to COVID-19 virus 06/14/2019   Paresthesia 04/08/2019   Neck pain 03/30/2019   Radicular pain in right arm 03/30/2019   Numbness of leg 03/30/2019   Chest pain 07/24/2018   Hypotension 07/17/2018   Vertigo 07/17/2018   Nausea 07/17/2018   Vaccine counseling 05/21/2018   Need for influenza vaccination 05/21/2018   Routine general medical examination at a health care facility 05/21/2018   Caregiver burden 07/05/2017   Cyst of skin 07/05/2017   Elevated blood pressure reading 06/28/2017   Leg pain, diffuse, left 06/28/2017  Leg swelling 06/28/2017   Bruising 06/28/2017   Myofascial muscle pain 06/28/2017   High risk medication use 03/30/2017   Chronic constipation 11/17/2016   Globus sensation 11/17/2016   Primary osteoarthritis of both knees 10/05/2016   Autoimmune disease (HCC) 09/29/2016   Primary osteoarthritis of both hands 09/29/2016   Family history of  lupus erythematosus 09/20/2016   Polyarthralgia 05/06/2016   Clenching of teeth 05/06/2016   Finger swelling 05/06/2016   Morning stiffness of joints 05/06/2016   Myalgia 05/06/2016   Family history of rheumatoid arthritis 05/06/2016   Essential hypertension 12/23/2014   History of anemia 12/23/2014   Care provider for parents 12/23/2014   Post-operative state 11/13/2012   Hyperlipidemia 11/11/2012   Hypertension 11/11/2012   Impaired fasting glucose 10/25/2011   Gluten intolerance 10/25/2011   Gastroesophageal reflux disease without esophagitis 10/25/2011   Anxiety    Iron deficiency anemia     Past Medical History:  Diagnosis Date   Abnormal Pap smear 1988   treated with cryo   Anemia 11/2010   hematology consult prior; etiology malabsorption and uterine bleeding   Anxiety    COVID-19 10/28/2020   Gastric polyp    GERD (gastroesophageal reflux disease)    H/O bone density study 12/2007   H/O hysterectomy for benign disease 5/14   History of echocardiogram 03/2010   normal LV function, EF 60-65%, mild left atrial enlargement   HTN (hypertension) 03/2010   hospitalization for HTN urgency   Hyperlipemia    Internal hemorrhoids    Lumbar degenerative disc disease    Migraine    Myalgia    Numbness and tingling    Prediabetes    Spondylosis, cervical    Umbilical hernia    Uterine fibroid    hx/o     Family History  Problem Relation Age of Onset   Hypertension Mother    Dementia Mother    Colon cancer Mother 61   Deep vein thrombosis Father    Hypertension Father    Cancer Father        prostate CA   Heart disease Father 57       CAD   Parkinsonism Father    Hypertension Sister    Hyperlipidemia Sister    Sjogren's syndrome Sister    Hypertension Brother    Hyperlipidemia Brother    Hypertension Maternal Aunt    Hyperlipidemia Maternal Aunt    Rheum arthritis Maternal Aunt    Diabetes Maternal Aunt        1 with type II, 1 with type 1   Hypertension  Brother    Rheum arthritis Brother    Diabetes Paternal Aunt        type II   Breast cancer Neg Hx    Past Surgical History:  Procedure Laterality Date   ABDOMINAL HYSTERECTOMY     CERVICAL FUSION  2010   CERVICAL SPINE SURGERY     CERVIX LESION DESTRUCTION  1988   CESAREAN SECTION     CHOLECYSTECTOMY  2003   COLONOSCOPY  08/2010   Dr. Loreta Ave   COLONOSCOPY  02/2017   Dr. Loreta Ave   ESOPHAGOGASTRODUODENOSCOPY  08/2010   Dr. 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  Has 74 yo daughter.  Her siblings and her don't agree on helping take care of their parents, causes family tension.  As of 11/2014      Right-handed.   0.5 cup caffeine per day.   Her father lives with her.      Update 09/2019--father passed away from urosepsis in 09-02-2019  Immunization History  Administered Date(s) Administered   Influenza,inj,Quad PF,6+ Mos 05/01/2013, 03/31/2014, 05/17/2017, 05/21/2018, 03/29/2019, 04/10/2020, 06/11/2021   PFIZER Comirnaty(Gray Top)Covid-19 Tri-Sucrose Vaccine 10/08/2020   PFIZER(Purple Top)SARS-COV-2 Vaccination 08/24/2019, 09/17/2019, 03/30/2020   Td 02/04/2012   Zoster Recombinat (Shingrix) 09/15/2020, 12/08/2020     Objective: Vital Signs: BP (!) 143/86 (BP Location: Left Arm, Patient Position: Sitting, Cuff Size: Normal)   Pulse (!) 52   Ht 5\' 4"  (1.626 m)   Wt 138 lb 3.2 oz (62.7 kg)   LMP 10/25/2012 (Approximate)   BMI 23.72 kg/m    Physical Exam Vitals  and nursing note reviewed.  Constitutional:      Appearance: She is well-developed.  HENT:     Head: Normocephalic and atraumatic.  Eyes:     Conjunctiva/sclera: Conjunctivae normal.  Cardiovascular:     Rate and Rhythm: Normal rate and regular rhythm.     Heart sounds: Normal heart sounds.  Pulmonary:     Effort: Pulmonary effort is normal.     Breath sounds: Normal breath sounds.  Abdominal:     General: Bowel sounds are normal.     Palpations: Abdomen is soft.  Musculoskeletal:     Cervical back: Normal range of motion.  Lymphadenopathy:     Cervical: No cervical adenopathy.  Skin:    General: Skin is warm and dry.     Capillary Refill: Capillary refill takes less than 2 seconds.  Neurological:     Mental Status: She is alert and oriented to person, place, and time.  Psychiatric:        Behavior: Behavior normal.     Musculoskeletal Exam: C-spine was in good range of motion.  Shoulder joints, elbow joints, wrist joints, MCPs PIPs and DIPs with good range of motion with no synovitis.  Hip joints, knee joints, ankles, MTPs and PIPs with good range of motion with no synovitis.  She had tenderness over bilateral lateral epicondyle region, bilateral trochanteric area, medial aspect of bilateral knee joints, and trapezius region.  CDAI Exam: CDAI Score: -- Patient Global: --; Provider Global: -- Swollen: --; Tender: -- Joint Exam 09/21/2021   No joint exam has been documented for this visit   There is currently no information documented on the homunculus. Go to the Rheumatology activity and complete the homunculus joint exam.  Investigation: No additional findings.  Imaging: XR KNEE 3 VIEW LEFT  Result Date: 09/07/2021 Mild osteoarthritis with periarticular spurring.  XR KNEE 3 VIEW RIGHT  Result Date: 09/07/2021 Mild osteoarthritis with periarticular spurring.   Recent Labs: Lab Results  Component Value Date   WBC 4.4 04/22/2021   HGB 12.2 04/22/2021   PLT 171  04/22/2021   NA 141 04/22/2021   K 4.1 04/22/2021   CL 105 04/22/2021   CO2 28 04/22/2021   GLUCOSE 80 04/22/2021   BUN 15 04/22/2021   CREATININE 1.03  04/22/2021   BILITOT 0.4 04/22/2021   ALKPHOS 72 04/16/2020   AST 27 04/22/2021   ALT 22 04/22/2021   PROT 7.1 04/22/2021   ALBUMIN 4.4 04/16/2020   CALCIUM 10.1 04/22/2021   GFRAA 70 11/19/2020    Speciality Comments: PLQ Eye Exam: 06/09/2021 WNL @ [facility]. Follow up in 1 year.   Procedures:  No procedures performed Allergies: Quinolones, Garlic, Latex, Levaquin [levofloxacin], Losartan, Metoprolol, Onion, Penicillins, Percocet [oxycodone-acetaminophen], Sulfa drugs cross reactors, Vicodin [hydrocodone-acetaminophen], and Gluten meal   Assessment / Plan:     Visit Diagnoses: Autoimmune disease (HCC) - ANA 1:320 NO, dsDNA8, RF-,CCP-, History of nasal ulcers and oral ulcers in the past, positive synovitis in left hand MCPs on [place] previously:  -She denies any history of nasal ulcers and oral ulcers.  She continues to have mild sicca symptoms.  No synovitis was noted on the examination.  I will obtain autoimmune plan: Protein-/ creatinine ratio, urine, Anti-DNA antibody, double-stranded, C3 and C4, Sedimentation rate  Sicca complex (HCC)-she has sicca symptoms which are mild.  She states she did not try pilocarpine.  She plans to try it in the future.  She has been using over-the-counter products which were discussed.  High risk medication use - Plaquenil 200 mg 1 tablet by mouth daily twice daily M-F only.  PLQ Eye Exam: 06/09/2021  - Plan: CBC with Differential/Platelet, COMPLETE METABOLIC PANEL WITH GFR today.  Information regarding immunization was placed in the AVS.  Pain of both elbows-she has discomfort over lateral epicondyle most likely related to myofascial pain.  Primary osteoarthritis of both hands-she continues to have some stiffness in her hands.  No synovitis was noted.  Trochanteric bursitis of both hips-had  tenderness over bilateral trochanteric bursa.  IT band stretches were discussed.  Primary osteoarthritis of both knees-most recent x-rays done by Dr. Deno Etienne were reviewed which showed mild osteoarthritis.  She continues to have pain and discomfort in her knee joints.  No warmth swelling or effusion was noted.  Her autoimmune disease is quiet.  I believe the discomfort is coming from underlying myofascial pain.  I will refer her to physical therapy.  Myofascial muscle pain-she continues to have generalized pain and has positive tender points.  She was referred to integrative therapies.  Other medical problems are listed as follows:  Family history of rheumatoid arthritis  Family history of lupus erythematosus  History of hyperlipidemia-dietary modifications and exercise was discussed.  A handout was placed in the AVS.  Vitamin D deficiency-vitamin D level was normal on Nov 19, 2020  History of gastroesophageal reflux (GERD)  History of hypertension-her blood pressure was elevated today.  She was advised to monitor blood pressure closely and follow-up with her PCP.  History of anxiety  Orders: Orders Placed This Encounter  Procedures   CBC with Differential/Platelet   COMPLETE METABOLIC PANEL WITH GFR   Protein / creatinine ratio, urine   Anti-DNA antibody, double-stranded   C3 and C4   Sedimentation rate   No orders of the defined types were placed in this encounter.   Follow-Up Instructions: Return in about 5 months (around 02/21/2022) for Autoimmune disease, Osteoarthritis.   Pollyann Savoy, MD  Note - This record has been created using Animal nutritionist.  Chart creation errors have been sought, but may not always  have been located. Such creation errors do not reflect on  the standard of medical care.

## 2021-09-14 ENCOUNTER — Other Ambulatory Visit: Payer: Self-pay

## 2021-09-14 ENCOUNTER — Ambulatory Visit: Payer: BC Managed Care – PPO | Admitting: Medical

## 2021-09-14 VITALS — BP 120/70 | HR 56 | Temp 98.3°F | Wt 138.4 lb

## 2021-09-14 DIAGNOSIS — M359 Systemic involvement of connective tissue, unspecified: Secondary | ICD-10-CM | POA: Diagnosis not present

## 2021-09-14 DIAGNOSIS — M79604 Pain in right leg: Secondary | ICD-10-CM

## 2021-09-14 DIAGNOSIS — M7918 Myalgia, other site: Secondary | ICD-10-CM | POA: Diagnosis not present

## 2021-09-14 DIAGNOSIS — M2550 Pain in unspecified joint: Secondary | ICD-10-CM | POA: Diagnosis not present

## 2021-09-14 DIAGNOSIS — M79605 Pain in left leg: Secondary | ICD-10-CM

## 2021-09-14 DIAGNOSIS — M419 Scoliosis, unspecified: Secondary | ICD-10-CM | POA: Insufficient documentation

## 2021-09-14 NOTE — Addendum Note (Signed)
Addended by: Minette Headland A on: 09/14/2021 12:31 PM ? ? Modules accepted: Orders ? ?

## 2021-09-14 NOTE — Progress Notes (Signed)
Subjective: ?Chief Complaint  ?Patient presents with  ? right leg pain  ?  Right leg pain- seen ortho and did xray but was told nothing bone related. Was given meloxicam but not helping and pain radiates up leg  ? ?Having some pains in leg x at least a month.   She notes falling on her right knee after she tripped on her coat, but there was no bruising or swelling.   However,was having pain in leg prior to the fall.  Pain is posteriorlateral hamstring down to calve including buttocks.  When standing, feels guarded to put pressure on the leg.  Gait is slower due to the right leg and knee pain.  No numbness or tingling in the right leg.   No bruising, but feels some fluid in right knee, more superficial, not in the joint per her and ortho.  Saw orthopedic about this last week, and they felt her bones were fine. Was given meloxicam but that isn't helping.  No other aggravating or relieving factors. No other complaint. ? ? ?Past Medical History:  ?Diagnosis Date  ? Abnormal Pap smear 1988  ? treated with cryo  ? Anemia 11/2010  ? hematology consult prior; etiology malabsorption and uterine bleeding  ? Anxiety   ? COVID-19 10/28/2020  ? Gastric polyp   ? GERD (gastroesophageal reflux disease)   ? H/O bone density study 12/2007  ? H/O hysterectomy for benign disease 5/14  ? History of echocardiogram 03/2010  ? normal LV function, EF 60-65%, mild left atrial enlargement  ? HTN (hypertension) 03/2010  ? hospitalization for HTN urgency  ? Hyperlipemia   ? Internal hemorrhoids   ? Lumbar degenerative disc disease   ? Migraine   ? Myalgia   ? Numbness and tingling   ? Prediabetes   ? Spondylosis, cervical   ? Umbilical hernia   ? Uterine fibroid   ? hx/o   ? ?Current Outpatient Medications on File Prior to Visit  ?Medication Sig Dispense Refill  ? acetaminophen (TYLENOL) 500 MG tablet Take 1 tablet (500 mg total) by mouth every 6 (six) hours as needed. 30 tablet 0  ? ALPRAZolam (XANAX) 0.5 MG tablet Take 0.5 mg by mouth as  needed for anxiety.    ? amLODipine (NORVASC) 5 MG tablet TAKE 1 TABLET BY MOUTH EVERY DAY 30 tablet 0  ? Ascorbic Acid (VITAMIN C) 1000 MG tablet Take 1,000 mg by mouth daily.    ? atorvastatin (LIPITOR) 20 MG tablet TAKE 1 TABLET BY MOUTH EVERY DAY 30 tablet 0  ? B Complex Vitamins (B COMPLEX PO) Take 1 tablet by mouth daily.    ? calcium carbonate (OS-CAL) 600 MG TABS Take 600 mg by mouth daily.    ? CINNAMON PO Take 1 tablet by mouth daily.     ? diclofenac sodium (VOLTAREN) 1 % GEL 3 grams to 3 large joints up to 3 times daily (Patient taking differently: as needed. 3 grams to 3 large joints up to 3 times daily) 3 Tube 3  ? escitalopram (LEXAPRO) 10 MG tablet Take 0.5 tablets (5 mg total) by mouth daily. 30 tablet 0  ? estradiol (VIVELLE-DOT) 0.025 MG/24HR Place 1 patch onto the skin 2 (two) times a week. 24 patch 3  ? Estradiol 10 MCG TABS vaginal tablet Place 1 tablet (10 mcg total) vaginally 2 (two) times a week. 8 tablet 11  ? fish oil-omega-3 fatty acids 1000 MG capsule Take 1 g by mouth daily.    ?  fluticasone (FLONASE) 50 MCG/ACT nasal spray SPRAY 2 SPRAYS INTO EACH NOSTRIL EVERY DAY 16 g 0  ? hydroxychloroquine (PLAQUENIL) 200 MG tablet TAKE ONE TABLET BY MOUTH TWICE DAILY MONDAY- FRIDAY ONLY 120 tablet 0  ? lubiprostone (AMITIZA) 24 MCG capsule Take 1 capsule (24 mcg total) by mouth 2 (two) times daily with a meal. 180 capsule 1  ? magic mouthwash SOLN Take 5 mLs by mouth 4 (four) times daily. 240 mL 0  ? meloxicam (MOBIC) 7.5 MG tablet Take 1 tablet (7.5 mg total) by mouth 2 (two) times daily as needed for pain. 30 tablet 2  ? Multiple Vitamin (MULTIVITAMIN) tablet Take 1 tablet by mouth daily.    ? NEXIUM 40 MG capsule Take 40 mg by mouth daily.     ? Probiotic Product (PROBIOTIC ADVANCED PO) Take 1 tablet by mouth daily.     ? TURMERIC PO Take 1 tablet by mouth daily.     ? pilocarpine (SALAGEN) 5 MG tablet Take 1 tablet (5 mg total) by mouth 3 (three) times daily as needed. (Patient not taking:  Reported on 09/14/2021) 90 tablet 2  ? ?No current facility-administered medications on file prior to visit.  ? ?ROS as in subjective ? ? ? ?Objective: ?BP 120/70   Pulse (!) 56   Temp 98.3 ?F (36.8 ?C)   Wt 138 lb 6.4 oz (62.8 kg)   LMP 10/25/2012 (Approximate)   BMI 23.76 kg/m?  ? ?Gen: wd, wn, nad, lean African-American female ?She seems to be tender down the right leg along the IT band, tender along the right trochanter bursa, tender along right buttocks and right lumbar spine.  She is also tender the left lumbar spine and left hip but not rest the leg.  Range of motion seems pretty normal of hips and knees and ankles.  No obvious swelling or deformity.  There may be some subtle soft tissue swelling around the patellar tendon inferiorly but otherwise no obvious swelling.  No deformity of the muscles. ?Legs seem neurovascularly intact. ?Back range of motion is relatively full ? ? ? ?Assessment: ?Encounter Diagnoses  ?Name Primary?  ? Pain in both lower extremities Yes  ? Polyarthralgia   ? Autoimmune disease ([place])   ? Myofascial muscle pain   ? Scoliosis, unspecified scoliosis type, unspecified spinal region   ? ? ? ?Plan ?We discussed her symptoms and exam.  She is tender over the right trochanteric bursa she is tender down the IT band, but she notes pain even down to her calf.  So there could be a combination of issues going on including some radicular issues as well as bursitis.  She does have underlying autoimmune disease and polyarthralgia, myofascial muscle pain.  She sees rheumatology and orthopedics. ? ?I reviewed the 2021 lumbar x-ray she had as well as recent bilateral knee x-rays in March 2023 per orthopedics.  The 2021 lumbar x-ray showed some thoracolumbar scoliosis. ? ?Continue anti-inflammatory that was restarted by orthopedics.  Continue stretching including some specific IT bands stretches we discussed.  Referral to physical therapy.   ? ?I do not feel that we are at a point where we need to  do updated imaging or MRI of lumbar spine ? ?Continue routine follow-up with orthopedics rheumatology ? ? ?Shari Lawrence was seen today for right leg pain. ? ?Diagnoses and all orders for this visit: ? ?Pain in both lower extremities ? ?Polyarthralgia ? ?Autoimmune disease ([place]) ? ?Myofascial muscle pain ? ?Scoliosis, unspecified scoliosis type, unspecified spinal  region ? ? ?F/u prn ? ?

## 2021-09-21 ENCOUNTER — Ambulatory Visit: Payer: BC Managed Care – PPO | Admitting: Rheumatology

## 2021-09-21 ENCOUNTER — Other Ambulatory Visit: Payer: Self-pay

## 2021-09-21 ENCOUNTER — Other Ambulatory Visit: Payer: Self-pay | Admitting: Medical

## 2021-09-21 ENCOUNTER — Encounter: Payer: Self-pay | Admitting: Rheumatology

## 2021-09-21 VITALS — BP 143/86 | HR 52 | Ht 64.0 in | Wt 138.2 lb

## 2021-09-21 DIAGNOSIS — E559 Vitamin D deficiency, unspecified: Secondary | ICD-10-CM

## 2021-09-21 DIAGNOSIS — M359 Systemic involvement of connective tissue, unspecified: Secondary | ICD-10-CM

## 2021-09-21 DIAGNOSIS — M25522 Pain in left elbow: Secondary | ICD-10-CM

## 2021-09-21 DIAGNOSIS — Z8639 Personal history of other endocrine, nutritional and metabolic disease: Secondary | ICD-10-CM

## 2021-09-21 DIAGNOSIS — M25521 Pain in right elbow: Secondary | ICD-10-CM | POA: Diagnosis not present

## 2021-09-21 DIAGNOSIS — M19042 Primary osteoarthritis, left hand: Secondary | ICD-10-CM

## 2021-09-21 DIAGNOSIS — Z862 Personal history of diseases of the blood and blood-forming organs and certain disorders involving the immune mechanism: Secondary | ICD-10-CM

## 2021-09-21 DIAGNOSIS — M19041 Primary osteoarthritis, right hand: Secondary | ICD-10-CM

## 2021-09-21 DIAGNOSIS — M7062 Trochanteric bursitis, left hip: Secondary | ICD-10-CM

## 2021-09-21 DIAGNOSIS — Z8679 Personal history of other diseases of the circulatory system: Secondary | ICD-10-CM

## 2021-09-21 DIAGNOSIS — Z79899 Other long term (current) drug therapy: Secondary | ICD-10-CM

## 2021-09-21 DIAGNOSIS — Z8719 Personal history of other diseases of the digestive system: Secondary | ICD-10-CM

## 2021-09-21 DIAGNOSIS — M7918 Myalgia, other site: Secondary | ICD-10-CM

## 2021-09-21 DIAGNOSIS — Z8659 Personal history of other mental and behavioral disorders: Secondary | ICD-10-CM

## 2021-09-21 DIAGNOSIS — M3500 Sicca syndrome, unspecified: Secondary | ICD-10-CM | POA: Diagnosis not present

## 2021-09-21 DIAGNOSIS — Z8261 Family history of arthritis: Secondary | ICD-10-CM

## 2021-09-21 DIAGNOSIS — M7061 Trochanteric bursitis, right hip: Secondary | ICD-10-CM

## 2021-09-21 DIAGNOSIS — Z840 Family history of diseases of the skin and subcutaneous tissue: Secondary | ICD-10-CM

## 2021-09-21 DIAGNOSIS — M170 Bilateral primary osteoarthritis of knee: Secondary | ICD-10-CM

## 2021-09-21 NOTE — Addendum Note (Signed)
Addended by: Earnestine Mealing on: 09/21/2021 02:27 PM ? ? Modules accepted: Orders ? ?

## 2021-09-21 NOTE — Patient Instructions (Addendum)
Vaccines You are taking a medication(s) that can suppress your immune system.  The following immunizations are recommended: Flu annually Covid-19  Td/Tdap (tetanus, diphtheria, pertussis) every 10 years Pneumonia (Prevnar 15 then Pneumovax 23 at least 1 year apart.  Alternatively, can take Prevnar 20 without needing additional dose) Shingrix: 2 doses from 4 weeks to 6 months apart  Please check with your PCP to make sure you are up to date.   Heart Disease Prevention   Your inflammatory disease increases your risk of heart disease which includes heart attack, stroke, atrial fibrillation (irregular heartbeats), high blood pressure, heart failure and atherosclerosis (plaque in the arteries).  It is important to reduce your risk by:   Keep blood pressure, cholesterol, and blood sugar at healthy levels   Smoking Cessation   Maintain a healthy weight  BMI 20-25   Eat a healthy diet  Plenty of fresh fruit, vegetables, and whole grains  Limit saturated fats, foods high in sodium, and added sugars  DASH and Mediterranean diet   Increase physical activity  Recommend moderate physically activity for 150 minutes per week/ 30 minutes a day for five days a week These can be broken up into three separate ten-minute sessions during the day.   Reduce Stress  Meditation, slow breathing exercises, yoga, coloring books  Dental visits twice a year   

## 2021-09-22 LAB — C3 AND C4
C3 Complement: 123 mg/dL (ref 83–193)
C4 Complement: 22 mg/dL (ref 15–57)

## 2021-09-22 LAB — CBC WITH DIFFERENTIAL/PLATELET
Absolute Monocytes: 320 cells/uL (ref 200–950)
Basophils Absolute: 41 cells/uL (ref 0–200)
Basophils Relative: 1.2 %
Eosinophils Absolute: 129 cells/uL (ref 15–500)
Eosinophils Relative: 3.8 %
HCT: 36.1 % (ref 35.0–45.0)
Hemoglobin: 11.6 g/dL — ABNORMAL LOW (ref 11.7–15.5)
Lymphs Abs: 1646 cells/uL (ref 850–3900)
MCH: 29.4 pg (ref 27.0–33.0)
MCHC: 32.1 g/dL (ref 32.0–36.0)
MCV: 91.6 fL (ref 80.0–100.0)
MPV: 12.1 fL (ref 7.5–12.5)
Monocytes Relative: 9.4 %
Neutro Abs: 1265 cells/uL — ABNORMAL LOW (ref 1500–7800)
Neutrophils Relative %: 37.2 %
Platelets: 170 10*3/uL (ref 140–400)
RBC: 3.94 10*6/uL (ref 3.80–5.10)
RDW: 13.1 % (ref 11.0–15.0)
Total Lymphocyte: 48.4 %
WBC: 3.4 10*3/uL — ABNORMAL LOW (ref 3.8–10.8)

## 2021-09-22 LAB — COMPLETE METABOLIC PANEL WITH GFR
AG Ratio: 1.8 (calc) (ref 1.0–2.5)
ALT: 18 U/L (ref 6–29)
AST: 24 U/L (ref 10–35)
Albumin: 4.3 g/dL (ref 3.6–5.1)
Alkaline phosphatase (APISO): 63 U/L (ref 37–153)
BUN: 13 mg/dL (ref 7–25)
CO2: 29 mmol/L (ref 20–32)
Calcium: 9.9 mg/dL (ref 8.6–10.4)
Chloride: 107 mmol/L (ref 98–110)
Creat: 0.89 mg/dL (ref 0.50–1.03)
Globulin: 2.4 g/dL (calc) (ref 1.9–3.7)
Glucose, Bld: 78 mg/dL (ref 65–99)
Potassium: 4.5 mmol/L (ref 3.5–5.3)
Sodium: 143 mmol/L (ref 135–146)
Total Bilirubin: 0.4 mg/dL (ref 0.2–1.2)
Total Protein: 6.7 g/dL (ref 6.1–8.1)
eGFR: 75 mL/min/{1.73_m2} (ref >=60)

## 2021-09-22 LAB — PROTEIN / CREATININE RATIO, URINE
Creatinine, Urine: 237 mg/dL (ref 20–275)
Protein/Creat Ratio: 89 mg/g creat (ref 24–184)
Protein/Creatinine Ratio: 0.089 mg/mg creat (ref 0.024–0.184)
Total Protein, Urine: 21 mg/dL (ref 5–24)

## 2021-09-22 LAB — ANTI-DNA ANTIBODY, DOUBLE-STRANDED: ds DNA Ab: 6 IU/mL — ABNORMAL HIGH

## 2021-09-22 LAB — SEDIMENTATION RATE: Sed Rate: 11 mm/h (ref 0–30)

## 2021-09-23 ENCOUNTER — Other Ambulatory Visit: Payer: Self-pay | Admitting: Medical

## 2021-09-23 NOTE — Progress Notes (Signed)
White cell count is low, hemoglobin is low.  We will continue to monitor.  Double-stranded DNA is lower than before.  CMP is normal.  Urine protein is negative.  Complements are normal, sed rate is normal.  Labs do not indicate disease flare.  She should continue current treatment.

## 2021-10-29 ENCOUNTER — Other Ambulatory Visit: Payer: Self-pay | Admitting: *Deleted

## 2021-10-29 DIAGNOSIS — M359 Systemic involvement of connective tissue, unspecified: Secondary | ICD-10-CM

## 2021-10-29 NOTE — Telephone Encounter (Signed)
Next Visit: 02/24/2022 ? ?Last Visit: 09/21/2021 ? ?Labs: 09/21/2021, White cell count is low, hemoglobin is low.  We will continue to monitor.  Double-stranded DNA is lower than before.  CMP is normal.  Urine protein is negative.  Complements are normal, sed rate is normal.  Labs do not indicate disease flare.  She should continue current treatment. ? ?Eye exam: 06/09/2021  ? ?Current Dose per office note 09/21/2021: Plaquenil 200 mg 1 tablet by mouth daily twice daily M-F only ? ?DX: Autoimmune disease  ? ?Last Fill: 07/23/2021 ? ?Okay to refill Plaquenil? ? ?

## 2021-11-01 MED ORDER — HYDROXYCHLOROQUINE SULFATE 200 MG PO TABS: ORAL_TABLET | ORAL | 0 refills | Status: DC

## 2021-11-16 ENCOUNTER — Telehealth: Payer: Self-pay | Admitting: Rheumatology

## 2021-11-16 NOTE — Telephone Encounter (Signed)
Patient advised she is due to update labs in August 2023. Patient expressed understanding. Patient states she will call her GYN about her urine and have them run a UA.

## 2021-11-16 NOTE — Telephone Encounter (Signed)
Patient called the office requesting a call back regarding when her labs are due. Patient states she also would like to talk to someone about her urine. Patient states its discolored.

## 2021-11-24 ENCOUNTER — Other Ambulatory Visit: Payer: Self-pay | Admitting: Obstetrics and Gynecology

## 2021-11-24 DIAGNOSIS — Z1231 Encounter for screening mammogram for malignant neoplasm of breast: Secondary | ICD-10-CM

## 2021-12-18 ENCOUNTER — Other Ambulatory Visit: Payer: Self-pay | Admitting: Medical

## 2021-12-23 ENCOUNTER — Ambulatory Visit: Payer: BC Managed Care – PPO

## 2021-12-23 ENCOUNTER — Ambulatory Visit
Admission: RE | Admit: 2021-12-23 | Discharge: 2021-12-23 | Disposition: A | Discharge status: Home / Self Care | Payer: BC Managed Care – PPO | Source: Ambulatory Visit | Attending: Obstetrics and Gynecology | Admitting: Obstetrics and Gynecology

## 2021-12-23 DIAGNOSIS — Z1231 Encounter for screening mammogram for malignant neoplasm of breast: Secondary | ICD-10-CM

## 2021-12-27 ENCOUNTER — Other Ambulatory Visit: Payer: Self-pay | Admitting: Obstetrics and Gynecology

## 2021-12-27 DIAGNOSIS — R928 Other abnormal and inconclusive findings on diagnostic imaging of breast: Secondary | ICD-10-CM

## 2021-12-28 ENCOUNTER — Other Ambulatory Visit: Payer: Self-pay | Admitting: Physician Assistant

## 2021-12-28 DIAGNOSIS — M359 Systemic involvement of connective tissue, unspecified: Secondary | ICD-10-CM

## 2022-01-05 ENCOUNTER — Ambulatory Visit
Admission: RE | Admit: 2022-01-05 | Discharge: 2022-01-05 | Disposition: A | Discharge status: Home / Self Care | Payer: BC Managed Care – PPO | Source: Ambulatory Visit | Attending: Obstetrics and Gynecology | Admitting: Obstetrics and Gynecology

## 2022-01-05 ENCOUNTER — Ambulatory Visit: Payer: BC Managed Care – PPO

## 2022-01-05 DIAGNOSIS — R928 Other abnormal and inconclusive findings on diagnostic imaging of breast: Secondary | ICD-10-CM

## 2022-01-28 ENCOUNTER — Other Ambulatory Visit: Payer: Self-pay | Admitting: Physician Assistant

## 2022-01-28 DIAGNOSIS — M359 Systemic involvement of connective tissue, unspecified: Secondary | ICD-10-CM

## 2022-01-28 NOTE — Telephone Encounter (Signed)
Next Visit: 02/24/2022  Last Visit: 09/21/2021  Labs: 09/21/2021 White cell count is low, hemoglobin is low.  We will continue to monitor.    Eye exam: 06/09/2021 WNL    Current Dose per office note 09/21/2021: Plaquenil 200 mg 1 tablet by mouth daily twice daily M-F only  UJ:[id number] disease   Last Fill: 11/01/2021  Okay to refill Plaquenil?

## 2022-01-29 ENCOUNTER — Other Ambulatory Visit: Payer: Self-pay | Admitting: Medical

## 2022-01-30 ENCOUNTER — Other Ambulatory Visit: Payer: Self-pay | Admitting: Medical

## 2022-02-01 NOTE — Telephone Encounter (Signed)
Sent pt my chart message to call and schedule an appt. Shari Lawrence

## 2022-02-09 ENCOUNTER — Other Ambulatory Visit: Payer: Self-pay | Admitting: Obstetrics and Gynecology

## 2022-02-09 DIAGNOSIS — Z79890 Hormone replacement therapy (postmenopausal): Secondary | ICD-10-CM

## 2022-02-10 NOTE — Progress Notes (Deleted)
Office Visit Note  Patient: Shari Lawrence             Date of Birth: 09-Nov-1960           MRN: [id number]             PCP: Shari Hurl, PA-C Referring: Shari Hurl, PA-C Visit Date: 02/24/2022 Occupation: '@GUAROCC'$ @  Subjective:  No chief complaint on file.   History of Present Illness: Shari Lawrence is a 61 y.o. female ***   Activities of Daily Living:  Patient reports morning stiffness for *** {minute/hour:19697}.   Patient {ACTIONS;DENIES/REPORTS:21021675::"Denies"} nocturnal pain.  Difficulty dressing/grooming: {ACTIONS;DENIES/REPORTS:21021675::"Denies"} Difficulty climbing stairs: {ACTIONS;DENIES/REPORTS:21021675::"Denies"} Difficulty getting out of chair: {ACTIONS;DENIES/REPORTS:21021675::"Denies"} Difficulty using hands for taps, buttons, cutlery, and/or writing: {ACTIONS;DENIES/REPORTS:21021675::"Denies"}  No Rheumatology ROS completed.   PMFS History:  Patient Active Problem List   Diagnosis Date Noted  . Scoliosis 09/14/2021  . Chronic right-sided thoracic back pain 07/28/2020  . Cervicalgia 07/28/2020  . Prediabetes   . Facial numbness 05/04/2020  . Leg numbness 05/04/2020  . Knee swelling 05/04/2020  . Facial pain 05/04/2020  . Ear pain, left 05/04/2020  . History of recent stressful life event 11/20/2019  . Panic attack 11/15/2019  . Fall 11/13/2019  . Pain in both hands 11/13/2019  . Sleep disturbance 11/13/2019  . Grief 11/13/2019  . Bilateral carpal tunnel syndrome 08/21/2019  . Chronic pain of both knees 06/14/2019  . Right arm pain 06/14/2019  . Close exposure to COVID-19 virus 06/14/2019  . Paresthesia 04/08/2019  . Neck pain 03/30/2019  . Radicular pain in right arm 03/30/2019  . Numbness of leg 03/30/2019  . Chest pain 07/24/2018  . Hypotension 07/17/2018  . Vertigo 07/17/2018  . Nausea 07/17/2018  . Vaccine counseling 05/21/2018  . Need for influenza vaccination 05/21/2018  . Routine general medical  examination at a health care facility 05/21/2018  . Caregiver burden 07/05/2017  . Cyst of skin 07/05/2017  . Elevated blood pressure reading 06/28/2017  . Leg pain, diffuse, left 06/28/2017  . Leg swelling 06/28/2017  . Bruising 06/28/2017  . Myofascial muscle pain 06/28/2017  . High risk medication use 03/30/2017  . Chronic constipation 11/17/2016  . Globus sensation 11/17/2016  . Primary osteoarthritis of both knees 10/05/2016  . Autoimmune disease ([place]) 09/29/2016  . Primary osteoarthritis of both hands 09/29/2016  . Family history of lupus erythematosus 09/20/2016  . Polyarthralgia 05/06/2016  . Clenching of teeth 05/06/2016  . Finger swelling 05/06/2016  . Morning stiffness of joints 05/06/2016  . Myalgia 05/06/2016  . Family history of rheumatoid arthritis 05/06/2016  . Essential hypertension 12/23/2014  . History of anemia 12/23/2014  . Care provider for parents 12/23/2014  . Post-operative state 11/13/2012  . Hyperlipidemia 11/11/2012  . Hypertension 11/11/2012  . Impaired fasting glucose 10/25/2011  . Gluten intolerance 10/25/2011  . Gastroesophageal reflux disease without esophagitis 10/25/2011  . Anxiety   . Iron deficiency anemia     Past Medical History:  Diagnosis Date  . Abnormal Pap smear 1988   treated with cryo  . Anemia 11/2010   hematology consult prior; etiology malabsorption and uterine bleeding  . Anxiety   . COVID-19 10/28/2020  . Gastric polyp   . GERD (gastroesophageal reflux disease)   . H/O bone density study 12/2007  . H/O hysterectomy for benign disease 5/14  . History of echocardiogram 03/2010   normal LV function, EF 60-65%, mild left atrial enlargement  . HTN (hypertension) 03/2010   hospitalization for  HTN urgency  . Hyperlipemia   . Internal hemorrhoids   . Lumbar degenerative disc disease   . Migraine   . Myalgia   . Numbness and tingling   . Prediabetes   . Spondylosis, cervical   . Umbilical hernia   . Uterine fibroid     hx/o     Family History  Problem Relation Age of Onset  . Hypertension Mother   . Dementia Mother   . Colon cancer Mother 90  . Deep vein thrombosis Father   . Hypertension Father   . Cancer Father        prostate CA  . Heart disease Father 8       CAD  . Parkinsonism Father   . Hypertension Sister   . Hyperlipidemia Sister   . Sjogren's syndrome Sister   . Hypertension Brother   . Hyperlipidemia Brother   . Hypertension Maternal Aunt   . Hyperlipidemia Maternal Aunt   . Rheum arthritis Maternal Aunt   . Diabetes Maternal Aunt        1 with type II, 1 with type 1  . Hypertension Brother   . Rheum arthritis Brother   . Diabetes Paternal Aunt        type II  . Breast cancer Neg Hx    Past Surgical History:  Procedure Laterality Date  . ABDOMINAL HYSTERECTOMY    . CERVICAL FUSION  2008/10/02  . CERVICAL SPINE SURGERY    . CERVIX LESION DESTRUCTION  1988  . CESAREAN SECTION    . CHOLECYSTECTOMY  10/02/01  . COLONOSCOPY  10-03-2010   Dr. Collene Mares  . COLONOSCOPY  02/2017   Dr. Collene Mares  . ESOPHAGOGASTRODUODENOSCOPY  October 03, 2010   Dr. Collene Mares  . EXPLORATORY LAPAROTOMY    . HERNIA REPAIR    . MYOMECTOMY  1998  . PELVIC LAPAROSCOPY    . ROBOTIC ASSISTED LAPAROSCOPIC HYSTERECTOMY AND SALPINGECTOMY  10/2012   [facility]; laproscopic due to fibroids  . 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  Has 61 yo daughter.  Her siblings and her don't agree on helping take care of their parents, causes family tension.  As of 11/2014      Right-handed.   0.5 cup caffeine per day.   Her father lives with her.      Update 09/2019--father passed away from urosepsis in 2019-10-03   Immunization History   Administered Date(s) Administered  . Influenza,inj,Quad PF,6+ Mos 05/01/2013, 03/31/2014, 05/17/2017, 05/21/2018, 03/29/2019, 04/10/2020, 06/11/2021  . PFIZER Comirnaty(Gray Top)Covid-19 Tri-Sucrose Vaccine 10/08/2020  . PFIZER(Purple Top)SARS-COV-2 Vaccination 08/24/2019, 09/17/2019, 03/30/2020  . Td 02/04/2012  . Zoster Recombinat (Shingrix) 09/15/2020, 12/08/2020     Objective: Vital Signs: LMP 10/25/2012 (Approximate)    Physical Exam   Musculoskeletal Exam: ***  CDAI Exam: CDAI Score: -- Patient Global: --; Provider Global: -- Swollen: --; Tender: -- Joint Exam 02/24/2022   No joint exam has been documented for this visit   There is currently no information documented on the homunculus. Go to the Rheumatology activity and complete the homunculus joint exam.  Investigation: No additional findings.  Imaging: No results found.  Recent Labs: Lab Results  Component Value Date   WBC 3.4 (  L) 09/21/2021   HGB 11.6 (L) 09/21/2021   PLT 170 09/21/2021   NA 143 09/21/2021   K 4.5 09/21/2021   CL 107 09/21/2021   CO2 29 09/21/2021   GLUCOSE 78 09/21/2021   BUN 13 09/21/2021   CREATININE 0.89 09/21/2021   BILITOT 0.4 09/21/2021   ALKPHOS 72 04/16/2020   AST 24 09/21/2021   ALT 18 09/21/2021   PROT 6.7 09/21/2021   ALBUMIN 4.4 04/16/2020   CALCIUM 9.9 09/21/2021   GFRAA 70 11/19/2020    Speciality Comments: PLQ Eye Exam: 06/09/2021 WNL @ [facility]. Follow up in 1 year.   Procedures:  No procedures performed Allergies: Quinolones, Garlic, Latex, Levaquin [levofloxacin], Losartan, Metoprolol, Onion, Penicillins, Percocet [oxycodone-acetaminophen], Sulfa drugs cross reactors, Vicodin [hydrocodone-acetaminophen], and Gluten meal   Assessment / Plan:     Visit Diagnoses: No diagnosis found.  Orders: No orders of the defined types were placed in this encounter.  No orders of the defined types were placed in this encounter.   Face-to-face time spent with  patient was *** minutes. Greater than 50% of time was spent in counseling and coordination of care.  Follow-Up Instructions: No follow-ups on file.   Earnestine Mealing, CMA  Note - This record has been created using Editor, commissioning.  Chart creation errors have been sought, but may not always  have been located. Such creation errors do not reflect on  the standard of medical care.

## 2022-02-15 ENCOUNTER — Encounter: Payer: Self-pay | Admitting: Medical

## 2022-02-15 ENCOUNTER — Ambulatory Visit: Payer: BC Managed Care – PPO | Admitting: Medical

## 2022-02-15 VITALS — Temp 97.4°F | Wt 137.0 lb

## 2022-02-15 DIAGNOSIS — Z79899 Other long term (current) drug therapy: Secondary | ICD-10-CM | POA: Diagnosis not present

## 2022-02-15 DIAGNOSIS — R42 Dizziness and giddiness: Secondary | ICD-10-CM

## 2022-02-15 DIAGNOSIS — I10 Essential (primary) hypertension: Secondary | ICD-10-CM | POA: Diagnosis not present

## 2022-02-15 DIAGNOSIS — H68003 Unspecified Eustachian salpingitis, bilateral: Secondary | ICD-10-CM

## 2022-02-15 NOTE — Progress Notes (Signed)
Subjective:  Shari Lawrence is a 61 y.o. female who presents for Chief Complaint  Patient presents with   get dizzy when standing    Get dizzy when standing. Some times when looking down at phone and then to stand up, gets severe pressure in head and ears and feels woozy. Was able to get home and her BP was 160/ 87- 30 mins after it had happen     Here for lightheadedness.  She notes that when she stands up very quickly she gets dizzy.  This happened for the last several days.  No vertigo or room spinning sensation.  No palpitations or chest pain.  No edema.  She had a few individual elevated blood pressure readings but most the time her blood pressures at home are normal or even low normal.  She feels a pressure sensation in her head and ears has had some popping in her ears.  No sore throat, body aches or chills, no fever.  Water intake is good.  Her pharmacist recommended she quit taking Lexapro given the interaction with Plaquenil and possible for arrhythmias in light of her dizziness.  However Jalene she just recently had her Lexapro increased from 5 to 10 mg by psychiatry, Dr. Buddy Duty.  She is using nothing for symptoms.  No other URI symptoms.  She does have some leftover meclizine at home that she has not used  No other aggravating or relieving factors.    No other c/o.  The following portions of the patient's history were reviewed and updated as appropriate: allergies, current medications, past family history, past medical history, past social history, past surgical history and problem list.  ROS Otherwise as in subjective above  Objective: Temp (!) 97.4 F (36.3 C)   Wt 137 lb (62.1 kg)   LMP 10/25/2012 (Approximate)   BMI 23.52 kg/m   General appearance: alert, no distress, well developed, well nourished HEENT: normocephalic, sclerae anicteric, conjunctiva pink and moist, TMs bilat with mild retraction but no erythema,  nares patent, no discharge or erythema,  pharynx normal Oral cavity: MMM, no lesions Neck: supple, no lymphadenopathy, no thyromegaly, no masses, no bruits Heart: RRR, normal S1, S2, no murmurs Lungs: CTA bilaterally, no wheezes, rhonchi, or rales Pulses: 2+ radial pulses, 2+ pedal pulses, normal cap refill Ext: no edema Neuro: Oriented x3, CN II through XII intact, nonfocal exam   Assessment: Encounter Diagnoses  Name Primary?   Lightheaded Yes   Salpingitis of both eustachian tubes    Primary hypertension    High risk medication use      Plan: We discussed possible causes of dizziness and lightheaded.  Orthostatics are normal today.  Blood pressures at home are normal.  Blood pressure readings today slightly elevated.  I suspect this is related to some inner ear pressure.  Advise she use the meclizine she has at home for the next few days, do some gentle ear pressure blowing, hydrate well, make slower motions and avoid quick motions standing or turning.  No signs or symptoms to suggest any worsening issue related to cardiac or intracranial changes.  She has been on Lexapro and Plaquenil together for several years since 2021 and at that time she had EKGs 2 different times in 2021.  She seems to tolerate these medicines so I am not particularly worried about QT changes.  We did discuss that certain antibiotics and some other medicines could interact to trigger arrhythmias but overall she has been on her current regimen a while  without any arrhythmic issues.  If not much improved in the next 2 days call or recheck.   [name] was seen today for get dizzy when standing.  Diagnoses and all orders for this visit:  Lightheaded  Salpingitis of both eustachian tubes  Primary hypertension  High risk medication use    Follow up: prn

## 2022-02-24 ENCOUNTER — Ambulatory Visit: Payer: BC Managed Care – PPO | Admitting: Rheumatology

## 2022-02-24 ENCOUNTER — Encounter: Payer: Self-pay | Admitting: Rheumatology

## 2022-02-24 ENCOUNTER — Ambulatory Visit: Payer: BC Managed Care – PPO | Attending: Rheumatology | Admitting: Rheumatology

## 2022-02-24 VITALS — BP 122/82 | HR 56 | Resp 16 | Ht 64.0 in | Wt 135.6 lb

## 2022-02-24 DIAGNOSIS — Z840 Family history of diseases of the skin and subcutaneous tissue: Secondary | ICD-10-CM

## 2022-02-24 DIAGNOSIS — Z8719 Personal history of other diseases of the digestive system: Secondary | ICD-10-CM

## 2022-02-24 DIAGNOSIS — M19041 Primary osteoarthritis, right hand: Secondary | ICD-10-CM

## 2022-02-24 DIAGNOSIS — M359 Systemic involvement of connective tissue, unspecified: Secondary | ICD-10-CM | POA: Diagnosis not present

## 2022-02-24 DIAGNOSIS — M7061 Trochanteric bursitis, right hip: Secondary | ICD-10-CM

## 2022-02-24 DIAGNOSIS — M170 Bilateral primary osteoarthritis of knee: Secondary | ICD-10-CM

## 2022-02-24 DIAGNOSIS — Z8679 Personal history of other diseases of the circulatory system: Secondary | ICD-10-CM

## 2022-02-24 DIAGNOSIS — Z79899 Other long term (current) drug therapy: Secondary | ICD-10-CM | POA: Diagnosis not present

## 2022-02-24 DIAGNOSIS — E559 Vitamin D deficiency, unspecified: Secondary | ICD-10-CM

## 2022-02-24 DIAGNOSIS — M3500 Sicca syndrome, unspecified: Secondary | ICD-10-CM

## 2022-02-24 DIAGNOSIS — M7711 Lateral epicondylitis, right elbow: Secondary | ICD-10-CM

## 2022-02-24 DIAGNOSIS — M7062 Trochanteric bursitis, left hip: Secondary | ICD-10-CM

## 2022-02-24 DIAGNOSIS — Z8261 Family history of arthritis: Secondary | ICD-10-CM

## 2022-02-24 DIAGNOSIS — Z8659 Personal history of other mental and behavioral disorders: Secondary | ICD-10-CM

## 2022-02-24 DIAGNOSIS — M19042 Primary osteoarthritis, left hand: Secondary | ICD-10-CM

## 2022-02-24 DIAGNOSIS — M7918 Myalgia, other site: Secondary | ICD-10-CM

## 2022-02-24 DIAGNOSIS — Z8639 Personal history of other endocrine, nutritional and metabolic disease: Secondary | ICD-10-CM

## 2022-02-24 DIAGNOSIS — M25521 Pain in right elbow: Secondary | ICD-10-CM

## 2022-02-24 NOTE — Patient Instructions (Signed)
Vaccines You are taking a medication(s) that can suppress your immune system.  The following immunizations are recommended: Flu annually Covid-19  Td/Tdap (tetanus, diphtheria, pertussis) every 10 years Pneumonia (Prevnar 15 then Pneumovax 23 at least 1 year apart.  Alternatively, can take Prevnar 20 without needing additional dose) Shingrix: 2 doses from 4 weeks to 6 months apart  Please check with your PCP to make sure you are up to date.  

## 2022-02-24 NOTE — Progress Notes (Signed)
Office Visit Note  Patient: Shari Lawrence             Date of Birth: [birth year]           MRN: [id number]             PCP: Carlena Hurl, PA-C Referring: Carlena Hurl, PA-C Visit Date: 02/24/2022 Occupation: '@GUAROCC'$ @  Subjective:  Right elbow pain  History of Present Illness: Shari Lawrence is a 61 y.o. female with history of autoimmune disease, osteoarthritis and myofascial pain.  Patient states that she has been taking hydroxychloroquine 200 mg twice daily Monday to Friday.  She denies any history of oral ulcers, nasal ulcers, malar rash, photosensitivity or Raynaud's phenomenon.  She continues to have dry mouth and dry eyes.  She continues to have joint pain and discomfort.  She states she had a very good response to physical therapy as regards to trochanteric bursitis and knee joint discomfort.  She has been having pain and discomfort in her right elbow which she describes over the right lateral epicondyle region.  She states the pain radiates into her hand and her shoulder.  Activities of Daily Living:  Patient reports morning stiffness for 1 hour.   Patient Reports nocturnal pain.  Difficulty dressing/grooming: Denies Difficulty climbing stairs: Reports Difficulty getting out of chair: Reports Difficulty using hands for taps, buttons, cutlery, and/or writing: Reports  Review of Systems  Constitutional:  Positive for fatigue.  HENT:  Positive for mouth dryness. Negative for mouth sores.   Eyes:  Positive for dryness.  Respiratory:  Negative for shortness of breath.   Cardiovascular:  Positive for palpitations.  Gastrointestinal:  Positive for constipation and diarrhea. Negative for blood in stool.  Endocrine: Negative for increased urination.  Genitourinary:  Negative for involuntary urination.  Musculoskeletal:  Positive for joint pain, joint pain, joint swelling, myalgias, morning stiffness, muscle tenderness and myalgias. Negative for gait problem  and muscle weakness.  Skin:  Positive for hair loss. Negative for color change, rash and sensitivity to sunlight.  Allergic/Immunologic: Negative for susceptible to infections.  Neurological:  Positive for dizziness. Negative for headaches.  Hematological:  Negative for swollen glands.  Psychiatric/Behavioral:  Positive for sleep disturbance. Negative for depressed mood. The patient is nervous/anxious.     PMFS History:  Patient Active Problem List   Diagnosis Date Noted   Scoliosis 09/14/2021   Chronic right-sided thoracic back pain 07/28/2020   Cervicalgia 07/28/2020   Prediabetes    Facial numbness 05/04/2020   Leg numbness 05/04/2020   Knee swelling 05/04/2020   Facial pain 05/04/2020   Ear pain, left 05/04/2020   History of recent stressful life event 11/20/2019   Panic attack 11/15/2019   Fall 11/13/2019   Pain in both hands 11/13/2019   Sleep disturbance 11/13/2019   Grief 11/13/2019   Bilateral carpal tunnel syndrome 08/21/2019   Chronic pain of both knees 06/14/2019   Right arm pain 06/14/2019   Close exposure to COVID-19 virus 06/14/2019   Paresthesia 04/08/2019   Neck pain 03/30/2019   Radicular pain in right arm 03/30/2019   Numbness of leg 03/30/2019   Chest pain 07/24/2018   Hypotension 07/17/2018   Vertigo 07/17/2018   Nausea 07/17/2018   Vaccine counseling 05/21/2018   Need for influenza vaccination 05/21/2018   Routine general medical examination at a health care facility 05/21/2018   Caregiver burden 07/05/2017   Cyst of skin 07/05/2017   Elevated blood pressure reading 06/28/2017   Leg pain,  diffuse, left 06/28/2017   Leg swelling 06/28/2017   Bruising 06/28/2017   Myofascial muscle pain 06/28/2017   High risk medication use 03/30/2017   Chronic constipation 11/17/2016   Globus sensation 11/17/2016   Primary osteoarthritis of both knees 10/05/2016   Autoimmune disease ([place]) 09/29/2016   Primary osteoarthritis of both hands 09/29/2016   Family  history of lupus erythematosus 09/20/2016   Polyarthralgia 05/06/2016   Clenching of teeth 05/06/2016   Finger swelling 05/06/2016   Morning stiffness of joints 05/06/2016   Myalgia 05/06/2016   Family history of rheumatoid arthritis 05/06/2016   Essential hypertension 12/23/2014   History of anemia 12/23/2014   Care provider for parents 12/23/2014   Post-operative state 11/13/2012   Hyperlipidemia 11/11/2012   Hypertension 11/11/2012   Impaired fasting glucose 10/25/2011   Gluten intolerance 10/25/2011   Gastroesophageal reflux disease without esophagitis 10/25/2011   Anxiety    Iron deficiency anemia     Past Medical History:  Diagnosis Date   Abnormal Pap smear 1988   treated with cryo   Anemia 11/2010   hematology consult prior; etiology malabsorption and uterine bleeding   Anxiety    COVID-19 10/28/2020   Gastric polyp    GERD (gastroesophageal reflux disease)    H/O bone density study 12/2007   H/O hysterectomy for benign disease 5/14   History of echocardiogram 03/2010   normal LV function, EF 60-65%, mild left atrial enlargement   HTN (hypertension) 03/2010   hospitalization for HTN urgency   Hyperlipemia    Internal hemorrhoids    Lumbar degenerative disc disease    Migraine    Myalgia    Numbness and tingling    Prediabetes    Spondylosis, cervical    Umbilical hernia    Uterine fibroid    hx/o     Family History  Problem Relation Age of Onset   Hypertension Mother    Dementia Mother    Colon cancer Mother 77   Deep vein thrombosis Father    Hypertension Father    Cancer Father        prostate CA   Heart disease Father 57       CAD   Parkinsonism Father    Hypertension Sister    Hyperlipidemia Sister    Sjogren's syndrome Sister    Hypertension Brother    Hyperlipidemia Brother    Hypertension Maternal Aunt    Hyperlipidemia Maternal Aunt    Rheum arthritis Maternal Aunt    Diabetes Maternal Aunt        1 with type II, 1 with type 1    Hypertension Brother    Rheum arthritis Brother    Diabetes Paternal Aunt        type II   Breast cancer Neg Hx    Past Surgical History:  Procedure Laterality Date   ABDOMINAL HYSTERECTOMY     CERVICAL FUSION  2010   CERVICAL SPINE SURGERY     CERVIX LESION DESTRUCTION  1988   CESAREAN SECTION     CHOLECYSTECTOMY  2003   COLONOSCOPY  08/2010   Dr. Collene Mares   COLONOSCOPY  02/2017   Dr. Collene Mares   ESOPHAGOGASTRODUODENOSCOPY  08/2010   Dr. 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  Has 69 yo daughter.  Her siblings and her don't agree on helping take care of their parents, causes family tension.  As of 11/2014      Right-handed.   0.5 cup caffeine per day.   Her father lives with her.      Update 09/2019--father passed away from urosepsis in 2019-09-11   Immunization History  Administered Date(s) Administered   Influenza,inj,Quad PF,6+ Mos 05/01/2013, 03/31/2014, 05/17/2017, 05/21/2018, 03/29/2019, 04/10/2020, 06/11/2021   PFIZER Comirnaty(Gray Top)Covid-19 Tri-Sucrose Vaccine 10/08/2020   PFIZER(Purple Top)SARS-COV-2 Vaccination 08/24/2019, 09/17/2019, 03/30/2020   Td 02/04/2012   Zoster Recombinat (Shingrix) 09/15/2020, 12/08/2020     Objective: Vital Signs: BP 122/82 (BP Location: Left Arm, Patient Position: Sitting, Cuff Size: Normal)   Pulse (!) 56   Resp 16   Ht '5\' 4"'$  (1.626 m)   Wt 135 lb 9.6 oz (61.5 kg)   LMP 10/25/2012 (Approximate)   BMI 23.28 kg/m     Physical Exam Vitals and nursing note reviewed.  Constitutional:      Appearance: She is well-developed.  HENT:     Head: Normocephalic and atraumatic.  Eyes:     Conjunctiva/sclera: Conjunctivae normal.  Cardiovascular:     Rate and Rhythm: Normal rate and regular rhythm.     Heart sounds: Normal heart sounds.  Pulmonary:     Effort: Pulmonary effort is normal.     Breath sounds: Normal breath sounds.  Abdominal:     General: Bowel sounds are normal.     Palpations: Abdomen is soft.  Musculoskeletal:     Cervical back: Normal range of motion.  Lymphadenopathy:     Cervical: No cervical adenopathy.  Skin:    General: Skin is warm and dry.     Capillary Refill: Capillary refill takes less than 2 seconds.  Neurological:     Mental Status: She is alert and oriented to person, place, and time.  Psychiatric:        Behavior: Behavior normal.      Musculoskeletal Exam: Cervical spine was in good range of motion.  Shoulder joints, elbow joints, wrist joints, MCPs PIPs and DIPs with good range of motion.  She had tenderness on palpation over right lateral epicondyle region.  Hip joints and knee joints with good range of motion.  She had no tenderness over ankles or MTPs.  CDAI Exam: CDAI Score: -- Patient Global: --; Provider Global: -- Swollen: --; Tender: -- Joint Exam 02/24/2022   No joint exam has been documented for this visit   There is currently no information documented on the homunculus. Go to the Rheumatology activity and complete the homunculus joint exam.  Investigation: No additional findings.  Imaging: No results found.  Recent Labs: Lab Results  Component Value Date   WBC 3.4 (L) 09/21/2021   HGB 11.6 (L) 09/21/2021   PLT 170 09/21/2021   NA 143 09/21/2021   K 4.5 09/21/2021   CL 107 09/21/2021   CO2 29 09/21/2021   GLUCOSE 78 09/21/2021   BUN 13 09/21/2021   CREATININE 0.89 09/21/2021   BILITOT 0.4 09/21/2021   ALKPHOS 72 04/16/2020   AST 24  09/21/2021   ALT 18 09/21/2021  PROT 6.7 09/21/2021   ALBUMIN 4.4 04/16/2020   CALCIUM 9.9 09/21/2021   GFRAA 70 11/19/2020    Speciality Comments: PLQ Eye Exam: 06/09/2021 WNL @ [facility]. Follow up in 1 year.   Procedures:  No procedures performed Allergies: Quinolones, Garlic, Latex, Levaquin [levofloxacin], Losartan, Metoprolol, Onion, Penicillins, Percocet [oxycodone-acetaminophen], Sulfa drugs cross reactors, Vicodin [hydrocodone-acetaminophen], and Gluten meal   Assessment / Plan:     Visit Diagnoses: Autoimmune disease ([place]) - ANA 1:320 NO, dsDNA8, RF-,CCP-, History of nasal ulcers and oral ulcers in the past, positive synovitis in left hand MCPs on [place] previously: -Patient denies any history of oral ulcers, nasal ulcers.  She continues to have sicca symptoms.  She complains of discomfort in her right elbow.  No synovitis was noted on the examination today.  I will obtain following labs today.  Plan: Protein / creatinine ratio, urine, ANA, Anti-DNA antibody, double-stranded, C3 and C4, Sedimentation rate  Sicca complex (HCC) -she continues to have dry mouth and dry eyes.  Over-the-counter products were discussed.    High risk medication use - Plaquenil 200 mg 1 tablet by mouth daily twice daily M-F only.  PLQ Eye Exam: 06/09/2021 Plan: CBC with Differential/Platelet, COMPLETE METABOLIC PANEL WITH GFR Labs obtained on September 21, 2021 showed neutropenia, anemia.  CMP with GFR was normal.  I advised her to get labs this week or next week.  We will call her with the results.  Lateral epicondylitis, right elbow -she tenderness on palpation over lateral or lateral epicondyle region.  These findings are consistent with lateral epicondylitis.  Detailed counseling was provided.  She was referred to physical therapy.  A prescription for right tennis elbow brace was given.  Use of topical anti-inflammatories were discussed. - Plan: Ambulatory referral to Physical Therapy  Primary  osteoarthritis of both hands-she has bilateral PIP and DIP thickening.  No synovitis was noted.  Trochanteric bursitis of both hips-she did not movement after physical therapy.  Primary osteoarthritis of both knees - most recent x-rays done by Dr. Erlinda Hong were reviewed which showed mild osteoarthritis.  She had good response to physical therapy.  Myofascial muscle pain-she continues to have some generalized pain and discomfort.  Other medical problems are listed as follows:  Family history of rheumatoid arthritis  Family history of lupus erythematosus  History of hypertension-blood pressure was normal today.  History of hyperlipidemia  History of gastroesophageal reflux (GERD)  Vitamin D deficiency-vitamin D was normal on Nov 19, 2020.  She is advised to continue vitamin D.  History of anxiety  Orders: Orders Placed This Encounter  Procedures   Protein / creatinine ratio, urine   CBC with Differential/Platelet   COMPLETE METABOLIC PANEL WITH GFR   ANA   Anti-DNA antibody, double-stranded   C3 and C4   Sedimentation rate   Ambulatory referral to Physical Therapy   No orders of the defined types were placed in this encounter.    Follow-Up Instructions: Return in about 5 months (around 07/27/2022) for Autoimmune disease.   [name], MD  Note - This record has been created using Editor, commissioning.  Chart creation errors have been sought, but may not always  have been located. Such creation errors do not reflect on  the standard of medical care.

## 2022-02-27 LAB — COMPLETE METABOLIC PANEL WITH GFR
AG Ratio: 2 (calc) (ref 1.0–2.5)
ALT: 23 U/L (ref 6–29)
AST: 24 U/L (ref 10–35)
Albumin: 4.4 g/dL (ref 3.6–5.1)
Alkaline phosphatase (APISO): 72 U/L (ref 37–153)
BUN: 12 mg/dL (ref 7–25)
CO2: 25 mmol/L (ref 20–32)
Calcium: 9.5 mg/dL (ref 8.6–10.4)
Chloride: 107 mmol/L (ref 98–110)
Creat: 0.8 mg/dL (ref 0.50–1.05)
Globulin: 2.2 g/dL (calc) (ref 1.9–3.7)
Glucose, Bld: 79 mg/dL (ref 65–99)
Potassium: 4.2 mmol/L (ref 3.5–5.3)
Sodium: 142 mmol/L (ref 135–146)
Total Bilirubin: 0.5 mg/dL (ref 0.2–1.2)
Total Protein: 6.6 g/dL (ref 6.1–8.1)
eGFR: 84 mL/min/{1.73_m2} (ref >=60)

## 2022-02-27 LAB — PROTEIN / CREATININE RATIO, URINE
Creatinine, Urine: 185 mg/dL (ref 20–275)
Protein/Creat Ratio: 86 mg/g creat (ref 24–184)
Protein/Creatinine Ratio: 0.086 mg/mg creat (ref 0.024–0.184)
Total Protein, Urine: 16 mg/dL (ref 5–24)

## 2022-02-27 LAB — CBC WITH DIFFERENTIAL/PLATELET
Absolute Monocytes: 300 cells/uL (ref 200–950)
Basophils Absolute: 40 cells/uL (ref 0–200)
Basophils Relative: 1 %
Eosinophils Absolute: 108 cells/uL (ref 15–500)
Eosinophils Relative: 2.7 %
HCT: 35.5 % (ref 35.0–45.0)
Hemoglobin: 11.8 g/dL (ref 11.7–15.5)
Lymphs Abs: 1772 cells/uL (ref 850–3900)
MCH: 29.9 pg (ref 27.0–33.0)
MCHC: 33.2 g/dL (ref 32.0–36.0)
MCV: 90.1 fL (ref 80.0–100.0)
MPV: 12.7 fL — ABNORMAL HIGH (ref 7.5–12.5)
Monocytes Relative: 7.5 %
Neutro Abs: 1780 cells/uL (ref 1500–7800)
Neutrophils Relative %: 44.5 %
Platelets: 163 10*3/uL (ref 140–400)
RBC: 3.94 10*6/uL (ref 3.80–5.10)
RDW: 13.5 % (ref 11.0–15.0)
Total Lymphocyte: 44.3 %
WBC: 4 10*3/uL (ref 3.8–10.8)

## 2022-02-27 LAB — C3 AND C4
C3 Complement: 130 mg/dL (ref 83–193)
C4 Complement: 24 mg/dL (ref 15–57)

## 2022-02-27 LAB — ANTI-NUCLEAR AB-TITER (ANA TITER)
ANA TITER: 1:40 {titer} — ABNORMAL HIGH
ANA TITER: 1:80 {titer} — ABNORMAL HIGH
ANA Titer 1: 1:80 {titer} — ABNORMAL HIGH

## 2022-02-27 LAB — ANA: Anti Nuclear Antibody (ANA): POSITIVE — AB

## 2022-02-27 LAB — ANTI-DNA ANTIBODY, DOUBLE-STRANDED: ds DNA Ab: 7 IU/mL — ABNORMAL HIGH

## 2022-02-27 LAB — SEDIMENTATION RATE: Sed Rate: 6 mm/h (ref 0–30)

## 2022-02-28 NOTE — Progress Notes (Signed)
CBC, CMP, sed rate, complements are normal.  ANA and double-stranded DNA are low titer positive.  Urine protein negative.  Labs do not indicate disease flare.  No change in treatment advised.

## 2022-03-02 ENCOUNTER — Encounter: Payer: Self-pay | Admitting: Internal Medicine

## 2022-03-13 ENCOUNTER — Other Ambulatory Visit: Payer: Self-pay | Admitting: Medical

## 2022-03-14 ENCOUNTER — Other Ambulatory Visit: Payer: Self-pay | Admitting: Medical

## 2022-03-14 ENCOUNTER — Telehealth: Payer: Self-pay

## 2022-03-14 DIAGNOSIS — R42 Dizziness and giddiness: Secondary | ICD-10-CM

## 2022-03-14 NOTE — Telephone Encounter (Signed)
Pt has upcoming appt in 2 weeks

## 2022-03-14 NOTE — Telephone Encounter (Signed)
Pt. Called stating she is still having the same vertigo issues and she has been doing the PT that you order which helped a little. She said the next step you suggested was a referral to ENT and she would like to go ahead with that.

## 2022-03-24 ENCOUNTER — Telehealth: Payer: Self-pay | Admitting: Internal Medicine

## 2022-03-24 ENCOUNTER — Encounter: Payer: BC Managed Care – PPO | Admitting: Medical

## 2022-03-24 NOTE — Telephone Encounter (Signed)
This patient no showed for her Physical appointment today.Which of the following is necessary for this patient.   A) No follow-up necessary   B) Follow-up urgent. Locate Patient Immediately.   C) Follow-up necessary. Contact patient and Schedule visit in ____ Days.   D) Follow-up Advised. Contact patient and Schedule visit in ____ Days.  E) Please Send no show letter to patient. Charge no show fee if no show was a CPE.

## 2022-04-05 ENCOUNTER — Encounter: Payer: Self-pay | Admitting: Internal Medicine

## 2022-04-09 ENCOUNTER — Other Ambulatory Visit: Payer: Self-pay | Admitting: Medical

## 2022-04-10 ENCOUNTER — Other Ambulatory Visit: Payer: Self-pay | Admitting: Medical

## 2022-04-11 NOTE — Telephone Encounter (Signed)
Pt has an appt end of november

## 2022-04-26 ENCOUNTER — Other Ambulatory Visit: Payer: Self-pay | Admitting: Rheumatology

## 2022-04-26 DIAGNOSIS — M359 Systemic involvement of connective tissue, unspecified: Secondary | ICD-10-CM

## 2022-04-26 NOTE — Telephone Encounter (Signed)
Next Visit: 08/09/2022  Last Visit: 02/24/2022  Labs: 02/25/2022 CBC, CMP, sed rate, complements are normal  Eye exam: 06/09/2021 WNL    Current Dose per office note 02/24/2022: Plaquenil 200 mg 1 tablet by mouth daily twice daily M-F only  DX: Autoimmune disease   Last Fill: 01/28/2022  Okay to refill Plaquenil?

## 2022-04-29 DIAGNOSIS — R2689 Other abnormalities of gait and mobility: Secondary | ICD-10-CM | POA: Insufficient documentation

## 2022-05-10 NOTE — Progress Notes (Deleted)
Start time: End time:  Virtual Visit via Video Note  I connected with Shari Lawrence on 05/10/22 by a video enabled telemedicine application and verified that I am speaking with the correct person using two identifiers.  Location: Patient: *** Provider: office   I discussed the limitations of evaluation and management by telemedicine and the availability of in person appointments. The patient expressed understanding and agreed to proceed.  History of Present Illness:  No chief complaint on file.     Observations/Objective:  LMP 10/25/2012 (Approximate)    Assessment and Plan:   Follow Up Instructions:    I discussed the assessment and treatment plan with the patient. The patient was provided an opportunity to ask questions and all were answered. The patient agreed with the plan and demonstrated an understanding of the instructions.   The patient was advised to call back or seek an in-person evaluation if the symptoms worsen or if the condition fails to improve as anticipated.  I spent *** minutes dedicated to the care of this patient, including pre-visit review of records, face to face time, post-visit ordering of testing and documentation.    Vikki Ports, MD

## 2022-05-11 ENCOUNTER — Telehealth: Payer: BC Managed Care – PPO | Admitting: Family Medicine

## 2022-05-17 ENCOUNTER — Ambulatory Visit (INDEPENDENT_AMBULATORY_CARE_PROVIDER_SITE_OTHER): Payer: BC Managed Care – PPO | Admitting: Medical

## 2022-05-17 VITALS — BP 124/70 | HR 59 | Ht 64.0 in | Wt 135.4 lb

## 2022-05-17 DIAGNOSIS — Z0000 Encounter for general adult medical examination without abnormal findings: Secondary | ICD-10-CM

## 2022-05-17 DIAGNOSIS — R7301 Impaired fasting glucose: Secondary | ICD-10-CM | POA: Diagnosis not present

## 2022-05-17 DIAGNOSIS — Z23 Encounter for immunization: Secondary | ICD-10-CM | POA: Diagnosis not present

## 2022-05-17 DIAGNOSIS — K219 Gastro-esophageal reflux disease without esophagitis: Secondary | ICD-10-CM

## 2022-05-17 DIAGNOSIS — H93A9 Pulsatile tinnitus, unspecified ear: Secondary | ICD-10-CM

## 2022-05-17 DIAGNOSIS — I10 Essential (primary) hypertension: Secondary | ICD-10-CM | POA: Diagnosis not present

## 2022-05-17 DIAGNOSIS — R519 Headache, unspecified: Secondary | ICD-10-CM

## 2022-05-17 DIAGNOSIS — K5909 Other constipation: Secondary | ICD-10-CM | POA: Diagnosis not present

## 2022-05-17 DIAGNOSIS — R42 Dizziness and giddiness: Secondary | ICD-10-CM

## 2022-05-17 DIAGNOSIS — M170 Bilateral primary osteoarthritis of knee: Secondary | ICD-10-CM

## 2022-05-17 DIAGNOSIS — M19041 Primary osteoarthritis, right hand: Secondary | ICD-10-CM

## 2022-05-17 DIAGNOSIS — Z131 Encounter for screening for diabetes mellitus: Secondary | ICD-10-CM

## 2022-05-17 DIAGNOSIS — D508 Other iron deficiency anemias: Secondary | ICD-10-CM

## 2022-05-17 DIAGNOSIS — M19042 Primary osteoarthritis, left hand: Secondary | ICD-10-CM

## 2022-05-17 DIAGNOSIS — H938X3 Other specified disorders of ear, bilateral: Secondary | ICD-10-CM

## 2022-05-17 DIAGNOSIS — E785 Hyperlipidemia, unspecified: Secondary | ICD-10-CM

## 2022-05-17 DIAGNOSIS — Z840 Family history of diseases of the skin and subcutaneous tissue: Secondary | ICD-10-CM

## 2022-05-17 DIAGNOSIS — Z8261 Family history of arthritis: Secondary | ICD-10-CM

## 2022-05-17 DIAGNOSIS — Z7185 Encounter for immunization safety counseling: Secondary | ICD-10-CM

## 2022-05-17 DIAGNOSIS — M8580 Other specified disorders of bone density and structure, unspecified site: Secondary | ICD-10-CM

## 2022-05-17 DIAGNOSIS — F419 Anxiety disorder, unspecified: Secondary | ICD-10-CM

## 2022-05-17 MED ORDER — MUPIROCIN 2 % EX OINT: 1.0000 | TOPICAL_OINTMENT | Freq: Two times a day (BID) | CUTANEOUS | 0 refills | Status: DC

## 2022-05-17 MED ORDER — AMLODIPINE BESYLATE 5 MG PO TABS: 5.0000 mg | ORAL_TABLET | Freq: Every day | ORAL | 3 refills | Status: DC

## 2022-05-17 NOTE — Progress Notes (Signed)
Subjective:   HPI  Shari Lawrence is a 61 y.o. female who presents for Chief Complaint  Patient presents with   fasting cpe    Fasting cpe, has a spot on neck, sees obgyn    Patient Care Team: [name], [name], PA-C as PCP - General [name], MD as Attending Physician (Gastroenterology) [name], [name], MD as Attending Physician (Obstetrics and Gynecology) [name], MD as Consulting Physician (Rheumatology) [name], MD as Consulting Physician (Obstetrics and Gynecology) [name], DC as Referring Physician (Chiropractic Medicine) Sees dentist Sees eye doctor  Concerns: Here for well visit  Shari Lawrence is still having ongoing symptoms that Shari Lawrence described back in August including lightheadedness, swishing sound in Shari Lawrence ear pressure sensation in the head and ears,.  Shari Lawrence saw ENT earlier this month.  They did not think it was related to any type of ear nose and throat problem.  They were more concerned about anxiety versus blood pressure, told Shari Lawrence to follow back up.  Shari Lawrence is compliant with Shari Lawrence blood pressure medication.  No chest pain or palpitation but edema, shortness of breath.  Shari Lawrence is requesting some imaging for this  Shari Lawrence has a skin lesion on the back of Shari Lawrence neck Shari Lawrence wants looked at.  It has been there for a week or more and will not heal   Reviewed their medical, surgical, family, social, medication, and allergy history and updated chart as appropriate.  Past Medical History:  Diagnosis Date   Abnormal Pap smear 1988   treated with cryo   Anemia 11/2010   hematology consult prior; etiology malabsorption and uterine bleeding   Anxiety    COVID-19 10/28/2020   Gastric polyp    GERD (gastroesophageal reflux disease)    H/O bone density study 12/2007   H/O hysterectomy for benign disease 5/14   History of echocardiogram 03/2010   normal LV function, EF 60-65%, mild left atrial enlargement   HTN (hypertension) 03/2010   hospitalization for HTN  urgency   Hyperlipemia    Internal hemorrhoids    Lumbar degenerative disc disease    Migraine    Myalgia    Numbness and tingling    Prediabetes    Spondylosis, cervical    Umbilical hernia    Uterine fibroid    hx/o     Family History  Problem Relation Age of Onset   Hypertension Mother    Dementia Mother    Colon cancer Mother 38   Deep vein thrombosis Father    Hypertension Father    Cancer Father        prostate CA   Heart disease Father 78       CAD   Parkinsonism Father    Hypertension Sister    Hyperlipidemia Sister    Sjogren's syndrome Sister    Hypertension Brother    Hyperlipidemia Brother    Hypertension Maternal Aunt    Hyperlipidemia Maternal Aunt    Rheum arthritis Maternal Aunt    Diabetes Maternal Aunt        1 with type II, 1 with type 1   Hypertension Brother    Rheum arthritis Brother    Diabetes Paternal Aunt        type II   Breast cancer Neg Hx      Current Outpatient Medications:    acetaminophen (TYLENOL) 500 MG tablet, Take 1 tablet (500 mg total) by mouth every 6 (six) hours as needed., Disp: 30 tablet, Rfl: 0  ALPRAZolam (XANAX) 0.5 MG tablet, Take 0.5 mg by mouth as needed for anxiety., Disp: , Rfl:    Ascorbic Acid (VITAMIN C) 1000 MG tablet, Take 1,000 mg by mouth daily., Disp: , Rfl:    atorvastatin (LIPITOR) 20 MG tablet, TAKE 1 TABLET BY MOUTH EVERY DAY, Disp: 30 tablet, Rfl: 1   B Complex Vitamins (B COMPLEX PO), Take 1 tablet by mouth daily., Disp: , Rfl:    calcium carbonate (OS-CAL) 600 MG TABS, Take 600 mg by mouth daily., Disp: , Rfl:    CINNAMON PO, Take 1 tablet by mouth daily. , Disp: , Rfl:    diclofenac sodium (VOLTAREN) 1 % GEL, 3 grams to 3 large joints up to 3 times daily (Patient taking differently: as needed. 3 grams to 3 large joints up to 3 times daily), Disp: 3 Tube, Rfl: 3   estradiol (VIVELLE-DOT) 0.025 MG/24HR, Place 1 patch onto the skin 2 (two) times a week., Disp: 24 patch, Rfl: 3   fish oil-omega-3  fatty acids 1000 MG capsule, Take 1 g by mouth daily., Disp: , Rfl:    fluticasone (FLONASE) 50 MCG/ACT nasal spray, SPRAY 2 SPRAYS INTO EACH NOSTRIL EVERY DAY, Disp: 16 mL, Rfl: 0   hydroxychloroquine (PLAQUENIL) 200 MG tablet, TAKE 1 TABLET BY MOUTH TWICE A DAY ON MONDAYS THROUGH FRIDAYS ONLY, Disp: 120 tablet, Rfl: 0   meloxicam (MOBIC) 7.5 MG tablet, Take 1 tablet (7.5 mg total) by mouth 2 (two) times daily as needed for pain., Disp: 30 tablet, Rfl: 2   Multiple Vitamin (MULTIVITAMIN) tablet, Take 1 tablet by mouth daily., Disp: , Rfl:    mupirocin ointment (BACTROBAN) 2 %, Apply 1 Application topically 2 (two) times daily., Disp: 22 g, Rfl: 0   NEXIUM 40 MG capsule, Take 40 mg by mouth daily. , Disp: , Rfl:    Probiotic Product (PROBIOTIC ADVANCED PO), Take 1 tablet by mouth daily. , Disp: , Rfl:    TURMERIC PO, Take 1 tablet by mouth daily. , Disp: , Rfl:    amLODipine (NORVASC) 5 MG tablet, Take 1 tablet (5 mg total) by mouth daily., Disp: 90 tablet, Rfl: 3   Estradiol 10 MCG TABS vaginal tablet, Place 1 tablet (10 mcg total) vaginally 2 (two) times a week. (Patient not taking: Reported on 05/17/2022), Disp: 8 tablet, Rfl: 11  Allergies  Allergen Reactions   Quinolones Anaphylaxis and Other (See Comments)    Whelps developed all over Shari Lawrence body   Garlic    Latex Itching and Other (See Comments)    Skin yellowing   Levaquin [Levofloxacin] Hives    Throat swelling   Losartan     Intolerance, fluctuating BPs   Metoprolol     Self reported hypotension   Onion    Penicillins    Percocet [Oxycodone-Acetaminophen]    Sulfa Drugs Cross Reactors Itching, Swelling and Other (See Comments)    Red face   Vicodin [Hydrocodone-Acetaminophen] Itching and Nausea And Vomiting   Gluten Meal     Other reaction(s): Other (See Comments) IBS     Review of Systems Constitutional: -fever, -chills, -sweats, -unexpected weight change, -decreased appetite, -fatigue Allergy: -sneezing, -itching,  -congestion Dermatology: -changing moles, --rash, -lumps, +skin lesion ENT: -runny nose, -ear pain, -sore throat, -hoarseness, -sinus pain, -teeth pain, - ringing in ears, -hearing loss, -nosebleeds Cardiology: -chest pain, -palpitations, -swelling, -difficulty breathing when lying flat, -waking up short of breath Respiratory: -cough, -shortness of breath, -difficulty breathing with exercise or exertion, -wheezing, -coughing up  blood Gastroenterology: -abdominal pain, -nausea, -vomiting, -diarrhea, -constipation, -blood in stool, -changes in bowel movement, -difficulty swallowing or eating Hematology: -bleeding, -bruising  Musculoskeletal: -joint aches, -muscle aches, -joint swelling, -back pain, -neck pain, -cramping, -changes in gait Ophthalmology: denies vision changes, eye redness, itching, discharge Urology: -burning with urination, -difficulty urinating, -blood in urine, -urinary frequency, -urgency, -incontinence Neurology: -headache, -weakness, -tingling, -numbness, -memory loss, -falls, -dizziness Psychology: -depressed mood, -agitation, -sleep problems Breast/gyn: -breast tendnerss, -discharge, -lumps, -vaginal discharge,- irregular periods, -heavy periods      05/17/2022   12:10 PM 09/14/2021   11:03 AM 05/06/2021    3:30 PM 11/19/2020    9:13 AM 10/29/2020    9:03 AM  Depression screen PHQ 2/9  Decreased Interest 0 0 0 0 0  Down, Depressed, Hopeless 3 0 0 0 0  PHQ - 2 Score 3 0 0 0 0  Altered sleeping 3  1 0   Tired, decreased energy 0  0 0   Change in appetite 0  0 0   Feeling bad or failure about yourself  0  0 0   Trouble concentrating 0  0 0   Moving slowly or fidgety/restless 0  0 0   Suicidal thoughts 0   0   PHQ-9 Score 6  1 0   Difficult doing work/chores   Not difficult at all         Objective:  BP 124/70 (Patient Position: Standing)   Pulse (!) 59   Ht '5\' 4"'$  (1.626 m)   Wt 135 lb 6.4 oz (61.4 kg)   LMP 10/25/2012 (Approximate)   BMI 23.24 kg/m    General appearance: alert, no distress, WD/WN, African American female Skin: right neck posteriolateral with raised 37m lesion with ingrown hairs, no induration redness or fluctuance HEENT: normocephalic, conjunctiva/corneas normal, sclerae anicteric, PERRLA, EOMi, nares patent, no discharge or erythema, pharynx normal Oral cavity: MMM, tongue normal, teeth in good repair Neck: supple, no lymphadenopathy, no thyromegaly, no masses, normal ROM, no bruits Chest: non tender, normal shape and expansion Heart: RRR, normal S1, S2, no murmurs Lungs: CTA bilaterally, no wheezes, rhonchi, or rales Abdomen: +bs, soft, non tender, non distended, no masses, no hepatomegaly, no splenomegaly, no bruits Back: non tender, normal ROM, no scoliosis Musculoskeletal: upper extremities non tender, no obvious deformity, normal ROM throughout, lower extremities non tender, no obvious deformity, normal ROM throughout Extremities: no edema, no cyanosis, no clubbing Pulses: 2+ symmetric, upper and lower extremities, normal cap refill Neurological: alert, oriented x 3, CN2-12 intact, strength normal upper extremities and lower extremities, sensation normal throughout, DTRs 2+ throughout, no cerebellar signs, gait normal Psychiatric: normal affect, behavior normal, pleasant  Breast/gyn/rectal - deferred to gynecology     Assessment and Plan :   Encounter Diagnoses  Name Primary?   Routine general medical examination at a health care facility Yes   Need for influenza vaccination    Essential hypertension    Chronic constipation    Gastroesophageal reflux disease without esophagitis    Impaired fasting glucose    Vaccine counseling    Other iron deficiency anemia    Hyperlipidemia, unspecified hyperlipidemia type    Screening for diabetes mellitus    Osteopenia, unspecified location    Pulsatile tinnitus    Pressure sensation in both ears    Pressure in head    Lightheaded    Primary osteoarthritis of  both hands    Primary osteoarthritis of both knees    Anxiety    Family  history of lupus erythematosus    Family history of rheumatoid arthritis      This visit was a preventative care visit, also known as wellness visit or routine physical.   Topics typically include healthy lifestyle, diet, exercise, preventative care, vaccinations, sick and well care, proper use of emergency dept and after hours care, as well as other concerns.     Recommendations: Continue to return yearly for your annual wellness and preventative care visits.  This gives [place] a chance to discuss healthy lifestyle, exercise, vaccinations, review your chart record, and perform screenings where appropriate.  I recommend you see your eye doctor yearly for routine vision care.  I recommend you see your dentist yearly for routine dental care including hygiene visits twice yearly.   Vaccination recommendations were reviewed Immunization History  Administered Date(s) Administered   Influenza,inj,Quad PF,6+ Mos 05/01/2013, 03/31/2014, 05/17/2017, 05/21/2018, 03/29/2019, 04/10/2020, 06/11/2021   PFIZER Comirnaty(Gray Top)Covid-19 Tri-Sucrose Vaccine 10/08/2020   PFIZER(Purple Top)SARS-COV-2 Vaccination 08/24/2019, 09/17/2019, 03/30/2020   Td 02/04/2012   Zoster Recombinat (Shingrix) 09/15/2020, 12/08/2020   Counseled on the influenza virus vaccine.  Vaccine information sheet given.  Influenza vaccine given after consent obtained.  Return at your convenience for Tdap, covid booster, and can get RSV vaccine at the pharmacy   Screening for cancer: Colon cancer screening: I reviewed your colonoscopy on file that is up to date from 2022, repeat in 5 years, 2027  Breast cancer screening: You should perform a self breast exam monthly.   We reviewed recommendations for regular mammograms and breast cancer screening.  Cervical cancer screening: We reviewed recommendations for pap smear screening.   Skin cancer  screening: Check your skin regularly for new changes, growing lesions, or other lesions of concern Come in for evaluation if you have skin lesions of concern.  Lung cancer screening: If you have a greater than 20 pack year history of tobacco use, then you may qualify for lung cancer screening with a chest CT scan.   Please call your insurance company to inquire about coverage for this test.  We currently don't have screenings for other cancers besides breast, cervical, colon, and lung cancers.  If you have a strong family history of cancer or have other cancer screening concerns, please let me know.    Bone health: Get at least 150 minutes of aerobic exercise weekly Get weight bearing exercise at least once weekly Bone density test:  A bone density test is an imaging test that uses a type of X-ray to measure the amount of calcium and other minerals in your bones. The test may be used to diagnose or screen you for a condition that causes weak or thin bones (osteoporosis), predict your risk for a broken bone (fracture), or determine how well your osteoporosis treatment is working. The bone density test is recommended for females 29 and older, or females or males <42 if certain risk factors such as thyroid disease, long term use of steroids such as for asthma or rheumatological issues, vitamin D deficiency, estrogen deficiency, family history of osteoporosis, self or family history of fragility fracture in first degree relative.   Please call to schedule your bone density test.   [facility]  [phone] [address], [place], [place] [address]   Heart health: Get at least 150 minutes of aerobic exercise weekly Limit alcohol It is important to maintain a healthy blood pressure and healthy cholesterol numbers  Heart disease screening: Screening for heart disease  includes screening for blood pressure, fasting lipids, glucose/diabetes screening,  BMI height to weight ratio, reviewed of smoking status, physical activity, and diet.    Goals include blood pressure 120/80 or less, maintaining a healthy lipid/cholesterol profile, preventing diabetes or keeping diabetes numbers under good control, not smoking or using tobacco products, exercising most days per week or at least 150 minutes per week of exercise, and eating healthy variety of fruits and vegetables, healthy oils, and avoiding unhealthy food choices like fried food, fast food, high sugar and high cholesterol foods.     Medical care options: I recommend you continue to seek care here first for routine care.  We try really hard to have available appointments Monday through Friday daytime hours for sick visits, acute visits, and physicals.  Urgent care should be used for after hours and weekends for significant issues that cannot wait till the next day.  The emergency department should be used for significant potentially life-threatening emergencies.  The emergency department is expensive, can often have long wait times for less significant concerns, so try to utilize primary care, urgent care, or telemedicine when possible to avoid unnecessary trips to the emergency department.  Virtual visits and telemedicine have been introduced since the pandemic started in 2020, and can be convenient ways to receive medical care.  We offer virtual appointments as well to assist you in a variety of options to seek medical care.   Advanced Directives: I recommend you consider completing a [place] and Living Will.   These documents respect your wishes and help alleviate burdens on your loved ones if you were to become terminally ill or be in a position to need those documents enforced.    You can complete Advanced Directives yourself, have them notarized, then have copies made for our office, for you and for anybody you feel should have them in safe keeping.  Or, you can have an  attorney prepare these documents.   If you haven't updated your Last Will and Testament in a while, it may be worthwhile having an attorney prepare these documents together and save on some costs.       Separate significant issues discussed: Lightheadedness, whooshing sound in ears, pulsatile tinnitus, lightheadedness-we discussed possible causes.  I reviewed Shari Lawrence recent ENT consult notes.  We will pursue imaging as below.  I reviewed a bunch of labs Shari Lawrence does have done in September 2023 through the rheumatology.  See EMR chart record.  Ingrown hair, skin lesion right posterior lateral neck-Shari Lawrence requested some type of treatment for this.  Shari Lawrence gave consent.  Cleaned and prepped area in usual sterile fashion, used 1% lidocaine with epi for local anesthesia, used a scalpel to unroofed the lesion and remove some ingrowing hairs.  No pus no other drainage.  Patient tolerated procedure well.  Covered with sterile bandage.  Prior impaired glucose-updated labs today  Hyperlipidemia-continue current medication, updated labs today  Shari Lawrence will continue routine follow-up rheumatology for autoimmune disease    Shari Lawrence was seen today for fasting cpe.  Diagnoses and all orders for this visit:  Routine general medical examination at a health care facility -     Lipid panel -     Hemoglobin A1c  Need for influenza vaccination  Essential hypertension -     MR Angiogram Head Wo Contrast; Future -     MR Angiogram Neck W Wo Contrast; Future  Chronic constipation  Gastroesophageal reflux disease without esophagitis  Impaired fasting glucose  Vaccine counseling  Other iron deficiency anemia  Hyperlipidemia, unspecified hyperlipidemia type -     Lipid panel  Screening for diabetes mellitus -     Hemoglobin A1c  Osteopenia, unspecified location -     DG Bone Density; Future  Pulsatile tinnitus -     MR Angiogram Head Wo Contrast; Future -     MR Angiogram Neck W Wo Contrast; Future  Pressure  sensation in both ears -     MR Angiogram Head Wo Contrast; Future -     MR Angiogram Neck W Wo Contrast; Future  Pressure in head -     MR Angiogram Head Wo Contrast; Future -     MR Angiogram Neck W Wo Contrast; Future  Lightheaded -     MR Angiogram Head Wo Contrast; Future -     MR Angiogram Neck W Wo Contrast; Future  Primary osteoarthritis of both hands  Primary osteoarthritis of both knees  Anxiety  Family history of lupus erythematosus  Family history of rheumatoid arthritis  Other orders -     amLODipine (NORVASC) 5 MG tablet; Take 1 tablet (5 mg total) by mouth daily. -     mupirocin ointment (BACTROBAN) 2 %; Apply 1 Application topically 2 (two) times daily.   Follow-up pending labs, yearly for physical

## 2022-05-17 NOTE — Patient Instructions (Signed)
Skin lesion of neck, ingrown hair Clean the wound on the neck with soap and water Use mupirocin ointment topically twice daily for the next 3-5 days Cover with bandaid If any signs of infection, redness, warmth, swelling, then recheck

## 2022-05-17 NOTE — Addendum Note (Signed)
Addended by: Minette Headland A on: 05/17/2022 03:12 PM   Modules accepted: Orders

## 2022-05-18 LAB — LIPID PANEL
Chol/HDL Ratio: 2.1 ratio (ref 0.0–4.4)
Cholesterol, Total: 170 mg/dL (ref 100–199)
HDL: 80 mg/dL (ref >39)
LDL Chol Calc (NIH): 80 mg/dL (ref 0–99)
Triglycerides: 51 mg/dL (ref 0–149)
VLDL Cholesterol Cal: 10 mg/dL (ref 5–40)

## 2022-05-18 LAB — HEMOGLOBIN A1C
Est. average glucose Bld gHb Est-mCnc: 126 mg/dL
Hgb A1c MFr Bld: 6 % — ABNORMAL HIGH (ref 4.8–5.6)

## 2022-05-23 ENCOUNTER — Other Ambulatory Visit: Payer: Self-pay | Admitting: Medical

## 2022-05-23 DIAGNOSIS — M8580 Other specified disorders of bone density and structure, unspecified site: Secondary | ICD-10-CM

## 2022-05-24 ENCOUNTER — Ambulatory Visit: Payer: BC Managed Care – PPO | Admitting: Medical

## 2022-05-24 VITALS — BP 120/70 | HR 76 | Wt 136.4 lb

## 2022-05-24 DIAGNOSIS — M359 Systemic involvement of connective tissue, unspecified: Secondary | ICD-10-CM | POA: Diagnosis not present

## 2022-05-24 DIAGNOSIS — I10 Essential (primary) hypertension: Secondary | ICD-10-CM | POA: Diagnosis not present

## 2022-05-24 DIAGNOSIS — R002 Palpitations: Secondary | ICD-10-CM

## 2022-05-24 DIAGNOSIS — F439 Reaction to severe stress, unspecified: Secondary | ICD-10-CM | POA: Diagnosis not present

## 2022-05-24 NOTE — Progress Notes (Signed)
Subjective:  Shari Lawrence is a 61 y.o. female who presents for Chief Complaint  Patient presents with   Palpitations    Palpitation since Sunday. Heart rate will jump up to 75 and then back down to 60. Its a quick thing. Doesn't think its heart related, thinks it might be a medication for anixety., came off lexapro about 1.5 months ago and wasn't sure if being off is causing this symptoms and just noticing it   Here for concerns about palpitations, intermittent or flutter in chest.  No dyspnea, no SOB, no leg swelling, no chest pain specifically.  She notes that she has been having some palpitations on and off but worse in the last few days.  She decided to stop Lexapro 1.5 - 2 months ago , relatively cold [place].  She wanted to come off this after discussing this with her psychiatrist Dr. Toy Care a while ago.  She had been on this few years when her parents were ill and declining in health.   She is not sure Lexapro discontinuation caused the palpation since it was 1+ month ago.   She has been dealing with some stress at church with some others, to the point she quit going temporarily.   She has felt somewhat overwhelmed in the past month.  Drinks caffeine some, sometimes more than other times.   Her heart rate is usually in the 59-60 range, but fluctuating between 59-70 in recent days  No other aggravating or relieving factors.    No other c/o.  Past Medical History:  Diagnosis Date   Abnormal Pap smear 1988   treated with cryo   Anemia 11/2010   hematology consult prior; etiology malabsorption and uterine bleeding   Anxiety    COVID-19 10/28/2020   Gastric polyp    GERD (gastroesophageal reflux disease)    H/O bone density study 12/2007   H/O hysterectomy for benign disease 5/14   History of echocardiogram 03/2010   normal LV function, EF 60-65%, mild left atrial enlargement   HTN (hypertension) 03/2010   hospitalization for HTN urgency   Hyperlipemia    Internal  hemorrhoids    Lumbar degenerative disc disease    Migraine    Myalgia    Numbness and tingling    Prediabetes    Spondylosis, cervical    Umbilical hernia    Uterine fibroid    hx/o    Current Outpatient Medications on File Prior to Visit  Medication Sig Dispense Refill   acetaminophen (TYLENOL) 500 MG tablet Take 1 tablet (500 mg total) by mouth every 6 (six) hours as needed. 30 tablet 0   ALPRAZolam (XANAX) 0.5 MG tablet Take 0.5 mg by mouth as needed for anxiety.     amLODipine (NORVASC) 5 MG tablet Take 1 tablet (5 mg total) by mouth daily. 90 tablet 3   Ascorbic Acid (VITAMIN C) 1000 MG tablet Take 1,000 mg by mouth daily.     atorvastatin (LIPITOR) 20 MG tablet TAKE 1 TABLET BY MOUTH EVERY DAY 30 tablet 1   B Complex Vitamins (B COMPLEX PO) Take 1 tablet by mouth daily.     calcium carbonate (OS-CAL) 600 MG TABS Take 600 mg by mouth daily.     CINNAMON PO Take 1 tablet by mouth daily.      diclofenac sodium (VOLTAREN) 1 % GEL 3 grams to 3 large joints up to 3 times daily (Patient taking differently: as needed. 3 grams to 3 large joints up to 3 times  daily) 3 Tube 3   estradiol (VIVELLE-DOT) 0.025 MG/24HR Place 1 patch onto the skin 2 (two) times a week. 24 patch 3   Estradiol 10 MCG TABS vaginal tablet Place 1 tablet (10 mcg total) vaginally 2 (two) times a week. 8 tablet 11   fish oil-omega-3 fatty acids 1000 MG capsule Take 1 g by mouth daily.     fluticasone (FLONASE) 50 MCG/ACT nasal spray SPRAY 2 SPRAYS INTO EACH NOSTRIL EVERY DAY 16 mL 0   hydroxychloroquine (PLAQUENIL) 200 MG tablet TAKE 1 TABLET BY MOUTH TWICE A DAY ON MONDAYS THROUGH FRIDAYS ONLY 120 tablet 0   meloxicam (MOBIC) 7.5 MG tablet Take 1 tablet (7.5 mg total) by mouth 2 (two) times daily as needed for pain. 30 tablet 2   Multiple Vitamin (MULTIVITAMIN) tablet Take 1 tablet by mouth daily.     mupirocin ointment (BACTROBAN) 2 % Apply 1 Application topically 2 (two) times daily. 22 g 0   NEXIUM 40 MG capsule  Take 40 mg by mouth daily.      Probiotic Product (PROBIOTIC ADVANCED PO) Take 1 tablet by mouth daily.      TURMERIC PO Take 1 tablet by mouth daily.      No current facility-administered medications on file prior to visit.     The following portions of the patient's history were reviewed and updated as appropriate: allergies, current medications, past family history, past medical history, past social history, past surgical history and problem list.  ROS Otherwise as in subjective above  Objective: BP 120/70   Pulse 76   Wt 136 lb 6.4 oz (61.9 kg)   LMP 10/25/2012 (Approximate)   BMI 23.41 kg/m   General appearance: alert, no distress, well developed, well nourished Neck: supple, no lymphadenopathy, no thyromegaly, no masses Heart: RRR, normal S1, S2, no murmurs Lungs: CTA bilaterally, no wheezes, rhonchi, or rales Pulses: 2+ radial pulses, 2+ pedal pulses, normal cap refill Ext: no edema Psych: pleasant, good eye contact, answers questions appropriately  EKG reviewed   Assessment: Encounter Diagnoses  Name Primary?   Palpitation Yes   Stress    Essential hypertension    Autoimmune disease ([place])      Plan: We discussed concerns, possible causes of palpitations.  She has been under some stress recently.  She came off Lexapro on her own about a month and 1/2 to 2 months ago.  We discussed ways to deal with stress and stress reduction.  We discussed Valsalva maneuvers when she feels a palpitation.  EKG unchanged from prior but does show possible atrial enlargement.  She has done event monitor in the past in 2021 that did not show any worrisome arrhythmia.  I reviewed back over some labs she just had recently in September.  We will update her thyroid labs today.  I suspect her palpitations are more stress related than anything.  Nevertheless, we may have her go back to see cardiology since she has not seen cardiology in a while in case we need to pursue event monitor testing  again  Jaidee was seen today for palpitations.  Diagnoses and all orders for this visit:  Palpitation -     EKG 12-Lead -     TSH + free T4  Stress  Essential hypertension  Autoimmune disease ([place])    Follow up: pending labs

## 2022-05-25 LAB — TSH+FREE T4
Free T4: 1.11 ng/dL (ref 0.82–1.77)
TSH: 1.88 u[IU]/mL (ref 0.450–4.500)

## 2022-06-06 ENCOUNTER — Telehealth: Payer: Self-pay | Admitting: Medical

## 2022-06-06 NOTE — Telephone Encounter (Signed)
Pt called and stated yall have discussed these issues since her dad passed but was wondering if adrenalin can cause dizziness or fluttering sensation inside. She has some test scheduled that she will have to pay out of pocket and she isn't sure if it would be worth her to pay out of pocket or not.

## 2022-06-08 NOTE — Telephone Encounter (Signed)
Informed pt, she says she spoke with her psychiatrist and they talked her into also getting the MR angiogram. She has an appointment tomorrow and she says thank you.

## 2022-06-09 ENCOUNTER — Ambulatory Visit
Admission: RE | Admit: 2022-06-09 | Discharge: 2022-06-09 | Disposition: A | Discharge status: Home / Self Care | Payer: BC Managed Care – PPO | Source: Ambulatory Visit | Attending: Medical | Admitting: Medical

## 2022-06-09 DIAGNOSIS — R42 Dizziness and giddiness: Secondary | ICD-10-CM

## 2022-06-09 DIAGNOSIS — I10 Essential (primary) hypertension: Secondary | ICD-10-CM

## 2022-06-09 DIAGNOSIS — R519 Headache, unspecified: Secondary | ICD-10-CM

## 2022-06-09 DIAGNOSIS — H938X3 Other specified disorders of ear, bilateral: Secondary | ICD-10-CM

## 2022-06-09 DIAGNOSIS — H93A9 Pulsatile tinnitus, unspecified ear: Secondary | ICD-10-CM

## 2022-06-12 NOTE — Progress Notes (Signed)
Results sent through MyChart

## 2022-06-13 ENCOUNTER — Other Ambulatory Visit: Payer: Self-pay | Admitting: Obstetrics and Gynecology

## 2022-06-13 DIAGNOSIS — Z5181 Encounter for therapeutic drug level monitoring: Secondary | ICD-10-CM

## 2022-06-13 NOTE — Telephone Encounter (Signed)
Med refill request: estradiol 0.'025mg'$  patch twice weekly Last AEX: 06/30/21 -JJ Next AEX: 07/05/22 -JJ Last MMG (if hormonal med): Screening 12/23/21; Left breast Dx 01/05/22; bi rads 1 neg, screening 1 yr  Rx pended for 30 day supply/0RF Refill authorized: Please Advise?

## 2022-06-14 ENCOUNTER — Ambulatory Visit
Admission: RE | Admit: 2022-06-14 | Discharge: 2022-06-14 | Disposition: A | Discharge status: Home / Self Care | Payer: BC Managed Care – PPO | Source: Ambulatory Visit | Attending: Medical | Admitting: Medical

## 2022-06-14 MED ORDER — GADOPICLENOL 0.5 MMOL/ML IV SOLN
6.0000 mL | Freq: Once | INTRAVENOUS | Status: AC | PRN
  Administered 2022-06-14: 6 mL via INTRAVENOUS

## 2022-06-15 NOTE — Progress Notes (Signed)
Results sent through MyChart

## 2022-06-23 ENCOUNTER — Telehealth: Payer: Self-pay | Admitting: *Deleted

## 2022-06-23 ENCOUNTER — Encounter: Payer: Self-pay | Admitting: Rheumatology

## 2022-06-23 ENCOUNTER — Telehealth: Payer: Self-pay | Admitting: Medical

## 2022-06-23 NOTE — Telephone Encounter (Signed)
Patient has been taking Lexapro and PLQ, patient states she has stopped taking Lexapro because patient states her heart flutters when she takes the PLQ and Lexapro together,however, she needs to be on an anti-depressant. Patient states she was advised to call to see what anti-depressant she can take that will not interfere with PLQ. Please advise.

## 2022-06-23 NOTE — Telephone Encounter (Signed)
Pt called and states she is still feeling the fluttering in her chest and she has been monitoring her heart rate and it is up and down. She is wondering of next steps/suggestions.

## 2022-06-23 NOTE — Telephone Encounter (Signed)
Shari Lawrence spoke with patient and pt is coming tomorrow

## 2022-06-23 NOTE — Telephone Encounter (Signed)
Patient has been taking Plaquenil and escitalopram for many years. It is not likely that either medication is causing palpitations. EKG from the end of Nov 2023 was wnl indicating that Plaquenil and escitalopram concomitant use did not appear to be contributing to or causing QTc prolongation Per review of note from PCP on 05/24/2022, patient has been off of Lexapro now for 2 months at least. Since we do not manage her depression, patient should follow up with psychiatry (Dr. Toy Care) in regards to other treatment options for mood.   Knox Saliva, PharmD, MPH, BCPS, CPP Clinical Pharmacist (Rheumatology and Pulmonology)

## 2022-06-23 NOTE — Telephone Encounter (Signed)
I called patient, patient verbalized understanding. 

## 2022-06-23 NOTE — Telephone Encounter (Signed)
Her symptoms seem consistent with costochondritis which could be related to myofascial pain or if she has had a recent URI/cough.   Costochondritis is typically treated with NSAIDs.  Her symptoms do not improve she should seek further evaluation.

## 2022-06-24 ENCOUNTER — Ambulatory Visit: Payer: BC Managed Care – PPO | Admitting: Medical

## 2022-06-24 ENCOUNTER — Encounter: Payer: Self-pay | Admitting: Medical

## 2022-06-24 VITALS — BP 122/80 | HR 59 | Temp 98.3°F | Wt 135.4 lb

## 2022-06-24 DIAGNOSIS — M359 Systemic involvement of connective tissue, unspecified: Secondary | ICD-10-CM

## 2022-06-24 DIAGNOSIS — M609 Myositis, unspecified: Secondary | ICD-10-CM

## 2022-06-24 DIAGNOSIS — K219 Gastro-esophageal reflux disease without esophagitis: Secondary | ICD-10-CM

## 2022-06-24 DIAGNOSIS — F419 Anxiety disorder, unspecified: Secondary | ICD-10-CM

## 2022-06-24 DIAGNOSIS — R002 Palpitations: Secondary | ICD-10-CM

## 2022-06-24 DIAGNOSIS — R0789 Other chest pain: Secondary | ICD-10-CM

## 2022-06-24 DIAGNOSIS — I10 Essential (primary) hypertension: Secondary | ICD-10-CM

## 2022-06-24 DIAGNOSIS — M7918 Myalgia, other site: Secondary | ICD-10-CM

## 2022-06-24 MED ORDER — DEXLANSOPRAZOLE 60 MG PO CPDR: 60.0000 mg | DELAYED_RELEASE_CAPSULE | Freq: Every day | ORAL | 3 refills | Status: DC

## 2022-06-24 NOTE — Progress Notes (Signed)
Subjective:  Shari Lawrence is a 61 y.o. female who presents for Chief Complaint  Patient presents with   other    Discuss fluttering in throat, chest muscles are sore, started about 2 months, worse lately,    Here for ongoing fluttering in upper chest, chest tendnerss, medication discussion, concerns  We did visit recently for same. She has also discussed this with her rheumatologist.  She is planning to restart Lexapro after talking with rheum.  She gets intermittent flutters in chest, worse in upper chest.  Has had some bad heartburn, bad belching.  Has even taken double Nexium at times.  No recent antacid   No syncope, no lightheaded, no fainting, no arm or jaw symptoms, no abdominal pain, nausea or vomiting.     Also is tender in chest wall to touch.  No recent injury or trauma.  Concerned about her adrenal glands, possible testing.  Uses tylenol for pain. Can't use meloxicam per GI.  Soaking in epsom salts helps some of her chest pains.  She is considering stopping caffeine to see if this helps.  BP has been a little elevated of late.  In the past was on dexilant which worked better, but Corporate investment banker cover this.  No other aggravating or relieving factors.    No other c/o.  Past Medical History:  Diagnosis Date   Abnormal Pap smear 1988   treated with cryo   Anemia 11/2010   hematology consult prior; etiology malabsorption and uterine bleeding   Anxiety    COVID-19 10/28/2020   Gastric polyp    GERD (gastroesophageal reflux disease)    H/O bone density study 12/2007   H/O hysterectomy for benign disease 5/14   History of echocardiogram 03/2010   normal LV function, EF 60-65%, mild left atrial enlargement   HTN (hypertension) 03/2010   hospitalization for HTN urgency   Hyperlipemia    Internal hemorrhoids    Lumbar degenerative disc disease    Migraine    Myalgia    Numbness and tingling    Prediabetes    Spondylosis, cervical    Umbilical  hernia    Uterine fibroid    hx/o    Current Outpatient Medications on File Prior to Visit  Medication Sig Dispense Refill   acetaminophen (TYLENOL) 500 MG tablet Take 1 tablet (500 mg total) by mouth every 6 (six) hours as needed. 30 tablet 0   ALPRAZolam (XANAX) 0.5 MG tablet Take 0.5 mg by mouth as needed for anxiety.     amLODipine (NORVASC) 5 MG tablet Take 1 tablet (5 mg total) by mouth daily. 90 tablet 3   Ascorbic Acid (VITAMIN C) 1000 MG tablet Take 1,000 mg by mouth daily.     atorvastatin (LIPITOR) 20 MG tablet TAKE 1 TABLET BY MOUTH EVERY DAY 30 tablet 1   B Complex Vitamins (B COMPLEX PO) Take 1 tablet by mouth daily.     calcium carbonate (OS-CAL) 600 MG TABS Take 600 mg by mouth daily.     CINNAMON PO Take 1 tablet by mouth daily.      estradiol (VIVELLE-DOT) 0.025 MG/24HR PLACE 1 PATCH ONTO THE SKIN 2 TIMES A WEEK. 8 patch 0   Estradiol 10 MCG TABS vaginal tablet Place 1 tablet (10 mcg total) vaginally 2 (two) times a week. 8 tablet 11   fish oil-omega-3 fatty acids 1000 MG capsule Take 1 g by mouth daily.     hydroxychloroquine (PLAQUENIL) 200 MG tablet TAKE 1 TABLET BY MOUTH  TWICE A DAY ON MONDAYS THROUGH FRIDAYS ONLY 120 tablet 0   Multiple Vitamin (MULTIVITAMIN) tablet Take 1 tablet by mouth daily.     NEXIUM 40 MG capsule Take 40 mg by mouth daily.      Probiotic Product (PROBIOTIC ADVANCED PO) Take 1 tablet by mouth daily.      TURMERIC PO Take 1 tablet by mouth daily.      No current facility-administered medications on file prior to visit.     The following portions of the patient's history were reviewed and updated as appropriate: allergies, current medications, past family history, past medical history, past social history, past surgical history and problem list.  ROS Otherwise as in subjective above  Objective: BP 122/80   Pulse (!) 59   Temp 98.3 F (36.8 C)   Wt 135 lb 6.4 oz (61.4 kg)   LMP 10/25/2012 (Approximate)   BMI 23.24 kg/m   Wt Readings  from Last 3 Encounters:  06/24/22 135 lb 6.4 oz (61.4 kg)  05/24/22 136 lb 6.4 oz (61.9 kg)  05/17/22 135 lb 6.4 oz (61.4 kg)    General appearance: alert, no distress, well developed, well nourished Neck: supple, no lymphadenopathy, no thyromegaly, no masses Chest + tendnerss in multiple places anteriorly no swelling or deformity, normal I:E Heart: RRR, normal S1, S2, no murmurs Lungs: CTA bilaterally, no wheezes, rhonchi, or rales Pulses: 2+ radial pulses, 2+ pedal pulses, normal cap refill Ext: no edema Psych: pleasant, good eye contact, answers questions appropriately    Assessment: Encounter Diagnoses  Name Primary?   Myofasciitis Yes   Chest discomfort    Palpitation    Anxiety    Essential hypertension    Autoimmune disease ([facility])    Myofascial muscle pain    Gastroesophageal reflux disease, unspecified whether esophagitis present       Plan: We discussed possible causes of palpitations, possible causes of chest discomfort.  We discussed her medicaiton, recent labs  Lab today for CK given chest wall pain, but likely her pain is related to myofascial pain  Palpations could be related to medication, GERD, anxiety or other.  She will consider follow up with cardiology for eval and event testing as she has had in the past back in 2021  She is going to try cutting out caffeine  Discussed GERD reflux mesa ures, will try to get back on dexilant, but for now continue Nexium and add antacids  Consider changing from amlodipine to diltiazem in the future if symptoms don't improve  She is going to restart Lexapro for possible anxiety component of symptoms  Not ure of Plaquenil is causing any of these symptoms, but she can discussed with rheumatology    Donetta was seen today for other.  Diagnoses and all orders for this visit:  Myofasciitis -     CK  Chest discomfort  Palpitation  Anxiety  Essential hypertension  Autoimmune disease ([place])  Myofascial  muscle pain  Gastroesophageal reflux disease, unspecified whether esophagitis present  Other orders -     dexlansoprazole (DEXILANT) 60 MG capsule; Take 1 capsule (60 mg total) by mouth daily.     Follow up: pending labs

## 2022-06-25 LAB — CK: Total CK: 135 U/L (ref 32–182)

## 2022-06-28 NOTE — Progress Notes (Deleted)
62 y.o. G40P1001 Divorced Black or African American Not Hispanic or Latino female here for annual exam.      Patient's last menstrual period was 10/25/2012 (approximate).          Sexually active: {yes no:314532}  The current method of family planning is {contraception:315051}.    Exercising: {yes no:314532}  {types:19826} Smoker:  {YES NO:22349}  Health Maintenance: Pap:06/30/21 WNL HR HPV Neg   04/20/2017 WNL NEG HR HPV History of abnormal Pap:  yes 1988 S/P Cryo  MMG:  12/24/21 incomplete 01/05/22 TOMO Bi-rads 1 neg  BMD:   09/05/19 osteopenic T-score -1.2, f/u in 5 years.   Colonoscopy: 02/27/21, diverticulosis, repeat 5 years  TDaP:  02/04/12  Gardasil: n/a    reports that she has never smoked. She has never been exposed to tobacco smoke. She has never used smokeless tobacco. She reports that she does not drink alcohol and does not use drugs.  Past Medical History:  Diagnosis Date   Abnormal Pap smear 1988   treated with cryo   Anemia 11/2010   hematology consult prior; etiology malabsorption and uterine bleeding   Anxiety    COVID-19 10/28/2020   Gastric polyp    GERD (gastroesophageal reflux disease)    H/O bone density study 12/2007   H/O hysterectomy for benign disease 5/14   History of echocardiogram 03/2010   normal LV function, EF 60-65%, mild left atrial enlargement   HTN (hypertension) 03/2010   hospitalization for HTN urgency   Hyperlipemia    Internal hemorrhoids    Lumbar degenerative disc disease    Migraine    Myalgia    Numbness and tingling    Prediabetes    Spondylosis, cervical    Umbilical hernia    Uterine fibroid    hx/o     Past Surgical History:  Procedure Laterality Date   ABDOMINAL HYSTERECTOMY     CERVICAL FUSION  2010   [place]     CHOLECYSTECTOMY  2003   COLONOSCOPY  08/2010   Dr. Collene Mares   COLONOSCOPY  02/2017   Dr. Collene Mares   ESOPHAGOGASTRODUODENOSCOPY  08/2010   Dr. Collene Mares    EXPLORATORY LAPAROTOMY     HERNIA REPAIR     MYOMECTOMY  1998   PELVIC LAPAROSCOPY     ROBOTIC ASSISTED LAPAROSCOPIC HYSTERECTOMY AND SALPINGECTOMY  10/2012   [facility]; laproscopic due to fibroids   UMBILICAL HERNIA REPAIR  10/2013   infected laparoscopic port, mesh placement    Current Outpatient Medications  Medication Sig Dispense Refill   acetaminophen (TYLENOL) 500 MG tablet Take 1 tablet (500 mg total) by mouth every 6 (six) hours as needed. 30 tablet 0   ALPRAZolam (XANAX) 0.5 MG tablet Take 0.5 mg by mouth as needed for anxiety.     amLODipine (NORVASC) 5 MG tablet Take 1 tablet (5 mg total) by mouth daily. 90 tablet 3   Ascorbic Acid (VITAMIN C) 1000 MG tablet Take 1,000 mg by mouth daily.     atorvastatin (LIPITOR) 20 MG tablet TAKE 1 TABLET BY MOUTH EVERY DAY 30 tablet 1   B Complex Vitamins (B COMPLEX PO) Take 1 tablet by mouth daily.     calcium carbonate (OS-CAL) 600 MG TABS Take 600 mg by mouth daily.     CINNAMON PO Take 1 tablet by mouth daily.      dexlansoprazole (DEXILANT) 60 MG capsule Take 1 capsule (60 mg  total) by mouth daily. 90 capsule 3   estradiol (VIVELLE-DOT) 0.025 MG/24HR PLACE 1 PATCH ONTO THE SKIN 2 TIMES A WEEK. 8 patch 0   Estradiol 10 MCG TABS vaginal tablet Place 1 tablet (10 mcg total) vaginally 2 (two) times a week. 8 tablet 11   fish oil-omega-3 fatty acids 1000 MG capsule Take 1 g by mouth daily.     hydroxychloroquine (PLAQUENIL) 200 MG tablet TAKE 1 TABLET BY MOUTH TWICE A DAY ON MONDAYS THROUGH FRIDAYS ONLY 120 tablet 0   Multiple Vitamin (MULTIVITAMIN) tablet Take 1 tablet by mouth daily.     NEXIUM 40 MG capsule Take 40 mg by mouth daily.      Probiotic Product (PROBIOTIC ADVANCED PO) Take 1 tablet by mouth daily.      TURMERIC PO Take 1 tablet by mouth daily.      No current facility-administered medications for this visit.    Family History  Problem Relation Age of Onset   Hypertension Mother    Dementia Mother    Colon cancer Mother 48    Deep vein thrombosis Father    Hypertension Father    Cancer Father        prostate CA   Heart disease Father 47       CAD   Parkinsonism Father    Hypertension Sister    Hyperlipidemia Sister    Sjogren's syndrome Sister    Hypertension Brother    Hyperlipidemia Brother    Hypertension Maternal Aunt    Hyperlipidemia Maternal Aunt    Rheum arthritis Maternal Aunt    Diabetes Maternal Aunt        1 with type II, 1 with type 1   Hypertension Brother    Rheum arthritis Brother    Diabetes Paternal Aunt        type II   Breast cancer Neg Hx     Review of Systems  Exam:   LMP 10/25/2012 (Approximate)   Weight change: '@WEIGHTCHANGE'$ @ Height:      Ht Readings from Last 3 Encounters:  05/17/22 '5\' 4"'$  (1.626 m)  02/24/22 '5\' 4"'$  (1.626 m)  09/21/21 '5\' 4"'$  (1.626 m)    General appearance: alert, cooperative and appears stated age Head: Normocephalic, without obvious abnormality, atraumatic Neck: no adenopathy, supple, symmetrical, trachea midline and thyroid {CHL AMB PHY EX THYROID NORM DEFAULT:[phone]::"normal to inspection and palpation"} Lungs: clear to auscultation bilaterally Cardiovascular: regular rate and rhythm Breasts: {Exam; breast:13139::"normal appearance, no masses or tenderness"} Abdomen: soft, non-tender; non distended,  no masses,  no organomegaly Extremities: extremities normal, atraumatic, no cyanosis or edema Skin: Skin color, texture, turgor normal. No rashes or lesions Lymph nodes: Cervical, supraclavicular, and axillary nodes normal. No abnormal inguinal nodes palpated Neurologic: Grossly normal   Pelvic: External genitalia:  no lesions              Urethra:  normal appearing urethra with no masses, tenderness or lesions              Bartholins and Skenes: normal                 Vagina: normal appearing vagina with normal color and discharge, no lesions              Cervix: {CHL AMB PHY EX CERVIX NORM DEFAULT:[phone]::"no lesions"}                Bimanual Exam:  Uterus:  {CHL AMB PHY EX UTERUS NORM DEFAULT:[phone]::"normal size, contour, position,  consistency, mobility, non-tender"}              Adnexa: {CHL AMB PHY EX ADNEXA NO MASS DEFAULT:[phone]::"no mass, fullness, tenderness"}               Rectovaginal: Confirms               Anus:  normal sphincter tone, no lesions  *** chaperoned for the exam.  A:  Well Woman with normal exam  P:

## 2022-06-28 NOTE — Progress Notes (Signed)
Results sent through MyChart

## 2022-07-01 ENCOUNTER — Other Ambulatory Visit: Payer: Self-pay | Admitting: Obstetrics and Gynecology

## 2022-07-01 DIAGNOSIS — N952 Postmenopausal atrophic vaginitis: Secondary | ICD-10-CM

## 2022-07-04 NOTE — Telephone Encounter (Signed)
Last AEX 06/30/2021--scheduled for 07/05/2022. Last mammo 12/23/2021-birads 0, Dx/ Lt-01/05/2022-neg birads 1  Since appt tomorrow, will refuse today and note that pt has appt tomorrow. Will refill then if appropriate.

## 2022-07-05 ENCOUNTER — Other Ambulatory Visit: Payer: Self-pay | Admitting: Medical

## 2022-07-05 ENCOUNTER — Ambulatory Visit: Payer: BC Managed Care – PPO | Admitting: Obstetrics and Gynecology

## 2022-07-05 MED ORDER — PANTOPRAZOLE SODIUM 40 MG PO TBEC: 40.0000 mg | DELAYED_RELEASE_TABLET | Freq: Every day | ORAL | 0 refills | Status: DC

## 2022-07-05 MED ORDER — ESOMEPRAZOLE MAGNESIUM 20 MG PO CPDR: 20.0000 mg | DELAYED_RELEASE_CAPSULE | Freq: Every day | ORAL | 0 refills | Status: DC

## 2022-07-05 NOTE — Telephone Encounter (Signed)
No P.A.'s received, Called pharmacy and had them process the [place] and it is not on formulary.  Covered alternatives are Omeprazole, esomeprazole, lansoprazole & Pantoprazole.  Do you want to switch to covered alternative?

## 2022-07-05 NOTE — Telephone Encounter (Signed)
Have yall seen any prior authorization paperwork or denial on Dexilant?  06/24/22 script

## 2022-07-05 NOTE — Addendum Note (Signed)
Addended by: Minette Headland A on: 07/05/2022 11:36 AM   Modules accepted: Orders

## 2022-07-05 NOTE — Telephone Encounter (Signed)
Alternative requested

## 2022-07-06 ENCOUNTER — Other Ambulatory Visit: Payer: Self-pay | Admitting: Obstetrics and Gynecology

## 2022-07-06 DIAGNOSIS — N952 Postmenopausal atrophic vaginitis: Secondary | ICD-10-CM

## 2022-07-06 NOTE — Telephone Encounter (Signed)
Last AEX 06/30/2021--was scheduled for 07/05/2022. However, cancelled due to weather. Was r/s for 08/02/2022.  Last mammo 12/23/2021-birads 0, Dx Lt 01/05/2022-neg birads 1

## 2022-07-07 ENCOUNTER — Other Ambulatory Visit: Payer: Self-pay | Admitting: Obstetrics and Gynecology

## 2022-07-07 DIAGNOSIS — N952 Postmenopausal atrophic vaginitis: Secondary | ICD-10-CM

## 2022-07-07 MED ORDER — ESTRADIOL 10 MCG VA TABS: ORAL_TABLET | VAGINAL | 0 refills | Status: DC

## 2022-07-07 NOTE — Progress Notes (Signed)
Refill sent.

## 2022-07-09 ENCOUNTER — Other Ambulatory Visit: Payer: Self-pay | Admitting: Medical

## 2022-07-11 ENCOUNTER — Other Ambulatory Visit (INDEPENDENT_AMBULATORY_CARE_PROVIDER_SITE_OTHER): Payer: BC Managed Care – PPO

## 2022-07-11 DIAGNOSIS — Z23 Encounter for immunization: Secondary | ICD-10-CM

## 2022-07-25 ENCOUNTER — Other Ambulatory Visit: Payer: Self-pay | Admitting: Obstetrics and Gynecology

## 2022-07-25 DIAGNOSIS — Z5181 Encounter for therapeutic drug level monitoring: Secondary | ICD-10-CM

## 2022-07-25 NOTE — Progress Notes (Signed)
62 y.o. G18P1001 Divorced Black or [place] American Not Hispanic or Latino female here for annual exam.  H/O hysterectomy. She is on a low dose estradiol patch and vaginal estrogen. She is getting hot off and on all night.   She c/o vaginal dryness, not consistent with the vaginal estrogen.   She is going to restart lexapro for depression/anxiety.    She has an autoimmune disorder, osteoarthritis and chronic pain. Rheumatologist is aware she on ERT. Prior testing negative for antiphospholipid antibody syndrome.   Patient's last menstrual period was 10/25/2012 (approximate).          Sexually active: No.  The current method of family planning is post menopausal status.    Exercising: Yes.     Walking and stretching  Smoker:  no  Health Maintenance: Pap:  06/30/21 WNL Hr HPV Neg; 04/20/2017 WNL NEG HR HPV History of abnormal Pap:  yes 1988 S/P Cryo  MMG:  12/23/21 imcomplete 01/05/22  Tomo density C Bi-rads 1 neg  BMD:  09/05/19 osteopenic T-score -1.2, f/u in 5 years.   Colonoscopy: 02/27/21, diverticulosis, repeat 5 years  TDaP:  02/04/12  Gardasil: n/a    reports that she has never smoked. She has never been exposed to tobacco smoke. She has never used smokeless tobacco. She reports that she does not drink alcohol and does not use drugs. 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 Daughter is a Education officer, museum, lives locally.     Past Medical History:  Diagnosis Date   Abnormal Pap smear 1988   treated with cryo   Anemia 11/2010   hematology consult prior; etiology malabsorption and uterine bleeding   Anxiety    COVID-19 10/28/2020   Gastric polyp    GERD (gastroesophageal reflux disease)    H/O bone density study 12/2007   H/O hysterectomy for benign disease 5/14   History of echocardiogram 03/2010   normal LV function, EF 60-65%, mild left atrial enlargement   HTN (hypertension) 03/2010   hospitalization for HTN urgency   Hyperlipemia    Internal hemorrhoids    Lumbar degenerative disc disease     Migraine    Myalgia    Numbness and tingling    Prediabetes    Spondylosis, cervical    Umbilical hernia    Uterine fibroid    hx/o     Past Surgical History:  Procedure Laterality Date   ABDOMINAL HYSTERECTOMY     CERVICAL FUSION  2010   [place]     CHOLECYSTECTOMY  2003   COLONOSCOPY  08/2010   Dr. Collene Mares   COLONOSCOPY  02/2017   Dr. Collene Mares   ESOPHAGOGASTRODUODENOSCOPY  08/2010   Dr. Collene Mares   EXPLORATORY LAPAROTOMY     HERNIA REPAIR     MYOMECTOMY  1998   PELVIC LAPAROSCOPY     ROBOTIC ASSISTED LAPAROSCOPIC HYSTERECTOMY AND SALPINGECTOMY  10/2012   [facility]; laproscopic due to fibroids   UMBILICAL HERNIA REPAIR  10/2013   infected laparoscopic port, mesh placement    Current Outpatient Medications  Medication Sig Dispense Refill   acetaminophen (TYLENOL) 500 MG tablet Take 1 tablet (500 mg total) by mouth every 6 (six) hours as needed. 30 tablet 0   ALPRAZolam (XANAX) 0.5 MG tablet Take 0.5 mg by mouth as needed for anxiety.     amLODipine (NORVASC) 5 MG tablet Take 1 tablet (5 mg total) by mouth daily. 90 tablet 3  Ascorbic Acid (VITAMIN C) 1000 MG tablet Take 1,000 mg by mouth daily.     atorvastatin (LIPITOR) 20 MG tablet TAKE 1 TABLET BY MOUTH EVERY DAY 30 tablet 2   B Complex Vitamins (B COMPLEX PO) Take 1 tablet by mouth daily.     calcium carbonate (OS-CAL) 600 MG TABS Take 600 mg by mouth daily.     CINNAMON PO Take 1 tablet by mouth daily.      esomeprazole (NEXIUM) 20 MG capsule Take 1 capsule (20 mg total) by mouth daily at 12 noon. 1 capsule 0   estradiol (VIVELLE-DOT) 0.025 MG/24HR PLACE 1 PATCH ONTO THE SKIN 2 TIMES A WEEK. 8 patch 0   Estradiol (YUVAFEM) 10 MCG TABS vaginal tablet PLACE ONE TABLET VAGINALLY TWICE WEEKLY AT BEDTIME 24 tablet 0   fish oil-omega-3 fatty acids 1000 MG capsule Take 1 g by mouth daily.     hydroxychloroquine (PLAQUENIL) 200 MG tablet TAKE 1 TABLET BY MOUTH TWICE A DAY  ON MONDAYS THROUGH FRIDAYS ONLY 120 tablet 0   Multiple Vitamin (MULTIVITAMIN) tablet Take 1 tablet by mouth daily.     pantoprazole (PROTONIX) 40 MG tablet Take 1 tablet (40 mg total) by mouth daily. 90 tablet 0   Probiotic Product (PROBIOTIC ADVANCED PO) Take 1 tablet by mouth daily.      TURMERIC PO Take 1 tablet by mouth daily.      No current facility-administered medications for this visit.    Family History  Problem Relation Age of Onset   Hypertension Mother    Dementia Mother    Colon cancer Mother 31   Deep vein thrombosis Father    Hypertension Father    Cancer Father        prostate CA   Heart disease Father 23       CAD   Parkinsonism Father    Hypertension Sister    Hyperlipidemia Sister    Sjogren's syndrome Sister    Hypertension Brother    Hyperlipidemia Brother    Hypertension Maternal Aunt    Hyperlipidemia Maternal Aunt    Rheum arthritis Maternal Aunt    Diabetes Maternal Aunt        1 with type II, 1 with type 1   Hypertension Brother    Rheum arthritis Brother    Diabetes Paternal Aunt        type II   Breast cancer Neg Hx     Review of Systems  All other systems reviewed and are negative.   Exam:   BP 128/82   Pulse 78   Ht '5\' 4"'$  (1.626 m) Comment: patient reported due to hair.  Wt 136 lb (61.7 kg)   LMP 10/25/2012 (Approximate)   SpO2 100%   BMI 23.34 kg/m   Weight change: '@WEIGHTCHANGE'$ @ Height:   Height: '5\' 4"'$  (162.6 cm) (patient reported due to hair.)  Ht Readings from Last 3 Encounters:  08/02/22 '5\' 4"'$  (1.626 m)  05/17/22 '5\' 4"'$  (1.626 m)  02/24/22 '5\' 4"'$  (1.626 m)    General appearance: alert, cooperative and appears stated age Head: Normocephalic, without obvious abnormality, atraumatic Neck: no adenopathy, supple, symmetrical, trachea midline and thyroid normal to inspection and palpation Lungs: clear to auscultation bilaterally Cardiovascular: regular rate and rhythm Breasts: normal appearance, no masses or  tenderness Abdomen: soft, non-tender; non distended,  no masses,  no organomegaly Extremities: extremities normal, atraumatic, no cyanosis or edema Skin: Skin color, texture, turgor normal. No rashes or lesions Lymph nodes: Cervical, supraclavicular,  and axillary nodes normal. No abnormal inguinal nodes palpated Neurologic: Grossly normal   Pelvic: External genitalia:  no lesions              Urethra:  normal appearing urethra with no masses, tenderness or lesions              Bartholins and Skenes: normal                 Vagina: normal appearing vagina with normal color and discharge, no lesions              Cervix: absent               Bimanual Exam:  Uterus:  uterus absent              Adnexa: no mass, fullness, tenderness               Rectovaginal: Confirms               Anus:  normal sphincter tone, no lesions  Gae Dry, CMA chaperoned for the exam.  1. Well woman exam Discussed breast self exam Discussed calcium and vit D intake Mammogram in 7/24 Colonoscopy UTD  2. Encounter for monitoring postmenopausal estrogen replacement therapy Aware of the risks, wants to continue - estradiol (VIVELLE-DOT) 0.025 MG/24HR; PLACE 1 PATCH ONTO THE SKIN 2 TIMES A WEEK.  Dispense: 24 patch; Refill: 3  3. Vaginal atrophy Having dryness, she will try and be consistent with taking it.  - Estradiol (YUVAFEM) 10 MCG TABS vaginal tablet; PLACE ONE TABLET VAGINALLY TWICE WEEKLY AT BEDTIME  Dispense: 24 tablet; Refill: 3

## 2022-07-26 NOTE — Progress Notes (Signed)
Office Visit Note  Patient: Shari Lawrence             Date of Birth: July 19, 1960           MRN: [id number]             PCP: Carlena Hurl, PA-C Referring: Carlena Hurl, PA-C Visit Date: 08/09/2022 Occupation: @GUAROCC$ @  Subjective:  Neck discomfort  History of Present Illness: Shari Lawrence is a 62 y.o. female with history of myofascial muscle pain, autoimmune disease and sicca complex. Patient is not feeling well today and complains of increased muscle pain and weakness, predominately in her chest, right trapezius and lower extremities BL. She was seen in the ED on 02/10 for this muscle pain. Cardiac etiology of chest pain was ruled out and she was given a ketorolac injection for costochondritis which helped. She used to be seen by physical therapy for the myalgias but is no longer able to afford these services. She continues to take plaquenil and is scheduled to see ophthalmology this afternoon. She has not missed any doses of the medication.   Activities of Daily Living:  Patient reports morning stiffness for depends .   Patient Reports nocturnal pain.  Difficulty dressing/grooming: Denies Difficulty climbing stairs: Reports Difficulty getting out of chair: Denies Difficulty using hands for taps, buttons, cutlery, and/or writing: Reports  Review of Systems  Constitutional:  Positive for fatigue.  HENT: Negative.  Negative for mouth sores and mouth dryness.   Eyes:  Positive for dryness.  Respiratory:  Positive for shortness of breath.   Cardiovascular:  Positive for chest pain and palpitations.  Gastrointestinal:  Positive for constipation. Negative for blood in stool and diarrhea.  Endocrine: Negative.  Negative for increased urination.  Genitourinary: Negative.  Negative for involuntary urination.  Musculoskeletal:  Positive for joint pain, joint pain and muscle tenderness. Negative for gait problem, joint swelling, myalgias, muscle weakness, morning  stiffness and myalgias.  Skin: Negative.  Negative for color change, rash, hair loss and sensitivity to sunlight.  Allergic/Immunologic: Negative.  Negative for susceptible to infections.  Neurological:  Positive for dizziness and headaches.  Hematological: Negative.  Negative for swollen glands.  Psychiatric/Behavioral:  Positive for depressed mood and sleep disturbance. The patient is nervous/anxious.     PMFS History:  Patient Active Problem List   Diagnosis Date Noted   Imbalance 04/29/2022   Scoliosis 09/14/2021   Chronic right-sided thoracic back pain 07/28/2020   Cervicalgia 07/28/2020   Prediabetes    Leg numbness 05/04/2020   Knee swelling 05/04/2020   History of recent stressful life event 11/20/2019   Panic attack 11/15/2019   Sleep disturbance 11/13/2019   Bilateral carpal tunnel syndrome 08/21/2019   Chronic pain of both knees 06/14/2019   Right arm pain 06/14/2019   Close exposure to COVID-19 virus 06/14/2019   Paresthesia 04/08/2019   Radicular pain in right arm 03/30/2019   Vaccine counseling 05/21/2018   Need for influenza vaccination 05/21/2018   Routine general medical examination at a health care facility 05/21/2018   Leg pain, diffuse, left 06/28/2017   Leg swelling 06/28/2017   Bruising 06/28/2017   Myofascial muscle pain 06/28/2017   High risk medication use 03/30/2017   Chronic constipation 11/17/2016   Globus sensation 11/17/2016   Primary osteoarthritis of both knees 10/05/2016   Autoimmune disease ([place]) 09/29/2016   Primary osteoarthritis of both hands 09/29/2016   Family history of lupus erythematosus 09/20/2016   Polyarthralgia 05/06/2016   Clenching of  teeth 05/06/2016   Family history of rheumatoid arthritis 05/06/2016   Essential hypertension 12/23/2014   History of anemia 12/23/2014   Post-operative state 11/13/2012   Hyperlipidemia 11/11/2012   Impaired fasting glucose 10/25/2011   Gluten intolerance 10/25/2011   Gastroesophageal  reflux disease without esophagitis 10/25/2011   Anxiety    Iron deficiency anemia     Past Medical History:  Diagnosis Date   Abnormal Pap smear 1988   treated with cryo   Anemia 11/2010   hematology consult prior; etiology malabsorption and uterine bleeding   Anxiety    COVID-19 10/28/2020   Gastric polyp    GERD (gastroesophageal reflux disease)    H/O bone density study 12/2007   H/O hysterectomy for benign disease 5/14   History of echocardiogram 03/2010   normal LV function, EF 60-65%, mild left atrial enlargement   HTN (hypertension) 03/2010   hospitalization for HTN urgency   Hyperlipemia    Internal hemorrhoids    Lumbar degenerative disc disease    Migraine    Myalgia    Numbness and tingling    Prediabetes    Spondylosis, cervical    Umbilical hernia    Uterine fibroid    hx/o     Family History  Problem Relation Age of Onset   Hypertension Mother    Dementia Mother    Colon cancer Mother 61   Deep vein thrombosis Father    Hypertension Father    Cancer Father        prostate CA   Heart disease Father 64       CAD   Parkinsonism Father    Hypertension Sister    Hyperlipidemia Sister    Sjogren's syndrome Sister    Hypertension Brother    Hyperlipidemia Brother    Hypertension Maternal Aunt    Hyperlipidemia Maternal Aunt    Rheum arthritis Maternal Aunt    Diabetes Maternal Aunt        1 with type II, 1 with type 1   Hypertension Brother    Rheum arthritis Brother    Diabetes Paternal Aunt        type II   Breast cancer Neg Hx    Past Surgical History:  Procedure Laterality Date   ABDOMINAL HYSTERECTOMY     CERVICAL FUSION  2010   CERVICAL SPINE SURGERY     CERVIX LESION DESTRUCTION  1988   CESAREAN SECTION     CHOLECYSTECTOMY  2003   COLONOSCOPY  08/2010   Dr. Collene Mares   COLONOSCOPY  02/2017   Dr. Collene Mares   ESOPHAGOGASTRODUODENOSCOPY  08/2010   Dr. 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  Has 5 yo daughter.  Her siblings and her don't agree on helping take care of their parents, causes family tension.  As of 11/2014      Right-handed.   0.5 cup caffeine per day.   Her father lives with her.  Update 09/2019--father passed away from urosepsis in 2019-09-13   Immunization History  Administered Date(s) Administered   COVID-19, mRNA, vaccine(Comirnaty)12 years and older 07/11/2022   Influenza,inj,Quad PF,6+ Mos 05/01/2013, 03/31/2014, 05/17/2017, 05/21/2018, 03/29/2019, 04/10/2020, 06/11/2021, 05/17/2022   PFIZER Comirnaty(Gray Top)Covid-19 Tri-Sucrose Vaccine 10/08/2020   PFIZER(Purple Top)SARS-COV-2 Vaccination 08/24/2019, 09/17/2019, 03/30/2020   Td 02/04/2012   Zoster Recombinat (Shingrix) 09/15/2020, 12/08/2020     Objective: Vital Signs: BP 125/86 (BP Location: Left Arm, Patient Position: Sitting, Cuff Size: Normal)   Pulse (!) 51   Resp 16   Ht 5' 4"$  (1.626 m)   Wt 133 lb 6.4 oz (60.5 kg)   LMP 10/25/2012 (Approximate)   BMI 22.90 kg/m    Physical Exam Vitals and nursing note reviewed.  Constitutional:      Appearance: She is well-developed.  HENT:     Head: Normocephalic and atraumatic.  Eyes:     Conjunctiva/sclera: Conjunctivae normal.  Cardiovascular:     Rate and Rhythm: Normal rate and regular rhythm.     Heart sounds: Normal heart sounds.  Pulmonary:     Effort: Pulmonary effort is normal.     Breath sounds: Normal breath sounds.  Abdominal:     General: Bowel sounds are  normal.     Palpations: Abdomen is soft.  Musculoskeletal:     Cervical back: Normal range of motion.  Lymphadenopathy:     Cervical: No cervical adenopathy.  Skin:    General: Skin is warm and dry.     Capillary Refill: Capillary refill takes less than 2 seconds.  Neurological:     Mental Status: She is alert and oriented to person, place, and time.  Psychiatric:        Behavior: Behavior normal.     Musculoskeletal Exam: C-spine has full ROM. Spasm of the right trapezius is visible on exam. Shoulder joints, elbow joints, wrist joints, MCPs PIPs and DIPs with good range of motion. She had tenderness on palpation over right lateral epicondyle region. Hip joints and knee joints with good range of motion. She had no tenderness over ankles or MTPs.   CDAI Exam: CDAI Score: -- Patient Global: --; Provider Global: -- Swollen: --; Tender: -- Joint Exam 08/09/2022   No joint exam has been documented for this visit   There is currently no information documented on the homunculus. Go to the Rheumatology activity and complete the homunculus joint exam.  Investigation: No additional findings.  Imaging: CT Angio Chest PE W/Cm &/Or Wo Cm  Result Date: 08/06/2022 CLINICAL DATA:  Chest pain and dizziness.  Positive D-dimer. EXAM: CT ANGIOGRAPHY CHEST WITH CONTRAST TECHNIQUE: Multidetector CT imaging of the chest was performed using the standard protocol during bolus administration of intravenous contrast. Multiplanar CT image reconstructions and MIPs were obtained to evaluate the vascular anatomy. RADIATION DOSE REDUCTION: This exam was performed according to the departmental dose-optimization program which includes automated exposure control, adjustment of the mA and/or kV according to patient size and/or use of iterative reconstruction technique. CONTRAST:  64m OMNIPAQUE IOHEXOL 350 MG/ML SOLN COMPARISON:  Current chest radiograph. FINDINGS: Cardiovascular: Pulmonary arteries are satisfactorily  opacified. There is no evidence of a pulmonary embolism. Heart normal in size and configuration. No pericardial effusion. Normal great vessels. Mediastinum/Nodes: No enlarged mediastinal, hilar, or axillary lymph nodes. Thyroid gland, trachea, and esophagus demonstrate no significant findings. Lungs/Pleura: Lungs are clear. No pleural effusion or pneumothorax. Upper Abdomen: Visualized upper abdominal structures are unremarkable. Musculoskeletal: No fracture or acute finding. No bone lesion.  Disc degenerative changes noted along the thoracic spine. No chest wall mass. Review of the MIP images confirms the above findings. IMPRESSION: 1. No evidence of a pulmonary embolism. 2. No acute findings.  Clear lungs. Electronically Signed   By: Lajean Manes M.D.   On: 08/06/2022 19:13   DG Chest Port 1 View  Result Date: 08/06/2022 CLINICAL DATA:  Chest pain and dizziness EXAM: PORTABLE CHEST 1 VIEW COMPARISON:  11/26/2019 and prior radiographs FINDINGS: The cardiomediastinal silhouette is unremarkable. An equivocal nodular opacity overlying the UPPER LEFT lung is noted. There is no evidence of airspace disease, consolidation,, pleural effusion or pneumothorax. No acute bony abnormalities are present. Cervical fusion hardware noted. IMPRESSION: 1. Equivocal nodular opacity overlying the UPPER LEFT lung. PA and LATERAL chest radiograph recommended for initial evaluation. 2. No other significant abnormalities. Electronically Signed   By: Margarette Canada M.D.   On: 08/06/2022 16:03    Recent Labs: Lab Results  Component Value Date   WBC 5.1 08/06/2022   HGB 13.0 08/06/2022   PLT 178 08/06/2022   NA 140 08/06/2022   K 4.0 08/06/2022   CL 104 08/06/2022   CO2 24 08/06/2022   GLUCOSE 77 08/06/2022   BUN 12 08/06/2022   CREATININE 0.86 08/06/2022   BILITOT 0.5 08/06/2022   ALKPHOS 81 08/06/2022   AST 29 08/06/2022   ALT 25 08/06/2022   PROT 8.4 (H) 08/06/2022   ALBUMIN 5.2 (H) 08/06/2022   CALCIUM 10.5 (H)  08/06/2022   GFRAA 70 11/19/2020    Speciality Comments: PLQ Eye Exam: 06/09/2021 WNL @ [facility]. Follow up in 1 year.  Scheduled for PLQ eye exam in 07/2022 per patient.  Procedures:  Trigger Point Inj  Date/Time: 08/09/2022 12:25 PM  Performed by: [name], MD Authorized by: [name], MD   Consent Given by:  Patient Site marked: the procedure site was marked   Timeout: prior to procedure the correct patient, procedure, and site was verified   Indications:  Muscle spasm and pain Total # of Trigger Points:  1 Location: neck   Needle Size:  27 G Approach:  Dorsal Medications #1:  0.5 mL lidocaine 1 %; 10 mg triamcinolone acetonide 40 MG/ML Patient tolerance:  Patient tolerated the procedure well with no immediate complications  Allergies: Quinolones, Garlic, Latex, Levaquin [levofloxacin], Losartan, Metoprolol, Onion, Penicillins, Percocet [oxycodone-acetaminophen], Sulfa drugs cross reactors, Vicodin [hydrocodone-acetaminophen], and Gluten meal   Assessment / Plan:     Visit Diagnoses: Autoimmune disease ([place]) - ANA 1:320 NO, dsDNA8, RF-,CCP-, History of nasal ulcers and oral ulcers in the past, positive synovitis in left hand MCPs on [place] previously: -She had no synovitis on the examination.  She denies any history of oral ulcers, nasal ulcers, malar rash, photosensitivity or lymphadenopathy.  Labs from August 06, 2022 CBC and CMP were reviewed.  Calcium was elevated.  She was advised to stop calcium supplement.  I will obtain autoimmune labs today.  Plan: Protein / creatinine ratio, urine, Anti-DNA antibody, double-stranded, C3 and C4, Sedimentation rate. She is on Plaquenil 200 mg BID M-F. Scheduled to see ophthalmology this afternoon.   Sicca complex ([place]) -she continues to have dry mouth and dry eye symptoms.  Over-the-counter products were discussed.  High risk medication use - Plaquenil 200 mg 1 tablet by mouth daily twice daily M-F only.  PLQ Eye Exam:  06/09/2021. Scheduled for eye exam this afternoon.  August 06, 2022 CBC and CMP were reviewed.  Information on immunization was placed  in the AVS.  Lateral epicondylitis, right elbow. Tenderness to palpation.  Stretching exercises were advised.  Trapezius spasm - Patient complains of pain and spasm of the right trapezius and spasm was present on exam. Trigger steroid injection performed in office today as described above.. Patient tolerated well.    Primary osteoarthritis of both hands-she had bilateral PIP and DIP thickening.  Joint protection muscle strengthening was discussed.  Trochanteric bursitis of both hips-she had tenderness of the right trochanteric bursa.  She wants to hold off cortisone injection at this point.  IT band stretches were advised.  Physical therapy was offered but patient declined.  Primary osteoarthritis of both knees - most recent x-rays done by Dr. Erlinda Hong were reviewed which showed mild osteoarthritis.  She had good response to physical therapy but cannot afford to go back at this time.  Lower extremity exercises were advised.  Myofascial muscle pain - Patient continues to have diffuse muscle pain consistent with myofascial muscle pain. Discussed integrative therapy and Cymbalta as treatment options but declined today.  Patient will discuss other options with her PCP.  Use of water aerobics and swimming were discussed.  Family history of rheumatoid arthritis  Family history of lupus erythematosus  History of hypertension-blood pressure was normal at 125/86.  History of hyperlipidemia  History of anxiety  Vitamin D deficiency  History of gastroesophageal reflux (GERD)  Orders: Orders Placed This Encounter  Procedures   Protein / creatinine ratio, urine   Anti-DNA antibody, double-stranded   C3 and C4   Sedimentation rate   CK   No orders of the defined types were placed in this encounter.    Follow-Up Instructions: Return in about 5 months (around  01/07/2023) for Autoimmune disease.   [name], MD  Note - This record has been created using Editor, commissioning.  Chart creation errors have been sought, but may not always  have been located. Such creation errors do not reflect on  the standard of medical care.

## 2022-07-28 NOTE — Telephone Encounter (Signed)
RF request received for Estradiol 0.'025mg'$  patch.  Last AEX 06/30/21.  AEX is scheduled 08/02/22.  P 06/30/21 WNL.  M 12/23/21 BI-RAD0.  MM DIAG Uni Left 01/05/22 BI-RAD1.

## 2022-07-30 ENCOUNTER — Other Ambulatory Visit: Payer: Self-pay | Admitting: Rheumatology

## 2022-07-30 DIAGNOSIS — M359 Systemic involvement of connective tissue, unspecified: Secondary | ICD-10-CM

## 2022-08-01 NOTE — Telephone Encounter (Addendum)
Next Visit: 08/09/2022  Last Visit: 02/24/2022  Labs: 02/25/2022 CBC, CMP, sed rate, complements are normal.  ANA and double-stranded DNA are low titer positive.  Urine protein negative.  Labs do not indicate disease flare.  No change in treatment advised.   Eye exam: 06/09/2021    Current Dose per office note on 02/24/2022: Plaquenil 200 mg 1 tablet by mouth daily twice daily M-F only.   TM:[id number] disease   Last Fill: 04/26/2022  Patient states she is scheduled to update PLQ eye exam this month.   Okay to refill Plaquenil?

## 2022-08-02 ENCOUNTER — Ambulatory Visit (INDEPENDENT_AMBULATORY_CARE_PROVIDER_SITE_OTHER): Payer: BC Managed Care – PPO | Admitting: Obstetrics and Gynecology

## 2022-08-02 ENCOUNTER — Encounter: Payer: Self-pay | Admitting: Obstetrics and Gynecology

## 2022-08-02 VITALS — BP 128/82 | HR 78 | Ht 64.0 in | Wt 136.0 lb

## 2022-08-02 DIAGNOSIS — N952 Postmenopausal atrophic vaginitis: Secondary | ICD-10-CM

## 2022-08-02 DIAGNOSIS — Z01419 Encounter for gynecological examination (general) (routine) without abnormal findings: Secondary | ICD-10-CM

## 2022-08-02 DIAGNOSIS — Z5181 Encounter for therapeutic drug level monitoring: Secondary | ICD-10-CM | POA: Diagnosis not present

## 2022-08-02 MED ORDER — ESTRADIOL 10 MCG VA TABS: ORAL_TABLET | VAGINAL | 3 refills | Status: DC

## 2022-08-02 MED ORDER — ESTRADIOL 0.025 MG/24HR TD PTTW: MEDICATED_PATCH | TRANSDERMAL | 3 refills | Status: DC

## 2022-08-02 NOTE — Patient Instructions (Signed)

## 2022-08-06 ENCOUNTER — Emergency Department (HOSPITAL_BASED_OUTPATIENT_CLINIC_OR_DEPARTMENT_OTHER): Payer: BC Managed Care – PPO

## 2022-08-06 ENCOUNTER — Emergency Department (HOSPITAL_BASED_OUTPATIENT_CLINIC_OR_DEPARTMENT_OTHER)
Admission: EM | Admit: 2022-08-06 | Discharge: 2022-08-06 | Disposition: A | Discharge status: Home / Self Care | Payer: BC Managed Care – PPO | Attending: Emergency Medicine | Admitting: Emergency Medicine

## 2022-08-06 ENCOUNTER — Other Ambulatory Visit: Payer: Self-pay

## 2022-08-06 ENCOUNTER — Encounter (HOSPITAL_BASED_OUTPATIENT_CLINIC_OR_DEPARTMENT_OTHER): Payer: Self-pay | Admitting: Emergency Medicine

## 2022-08-06 DIAGNOSIS — R079 Chest pain, unspecified: Secondary | ICD-10-CM | POA: Diagnosis present

## 2022-08-06 DIAGNOSIS — Z79899 Other long term (current) drug therapy: Secondary | ICD-10-CM | POA: Diagnosis not present

## 2022-08-06 DIAGNOSIS — R0602 Shortness of breath: Secondary | ICD-10-CM | POA: Insufficient documentation

## 2022-08-06 DIAGNOSIS — R42 Dizziness and giddiness: Secondary | ICD-10-CM | POA: Diagnosis not present

## 2022-08-06 DIAGNOSIS — I10 Essential (primary) hypertension: Secondary | ICD-10-CM | POA: Insufficient documentation

## 2022-08-06 DIAGNOSIS — Z91040 Latex allergy status: Secondary | ICD-10-CM | POA: Diagnosis not present

## 2022-08-06 DIAGNOSIS — Z1152 Encounter for screening for COVID-19: Secondary | ICD-10-CM | POA: Insufficient documentation

## 2022-08-06 DIAGNOSIS — R5383 Other fatigue: Secondary | ICD-10-CM | POA: Diagnosis not present

## 2022-08-06 LAB — CBC WITH DIFFERENTIAL/PLATELET
Abs Immature Granulocytes: 0.01 10*3/uL (ref 0.00–0.07)
Basophils Absolute: 0 10*3/uL (ref 0.0–0.1)
Basophils Relative: 1 %
Eosinophils Absolute: 0.1 10*3/uL (ref 0.0–0.5)
Eosinophils Relative: 3 %
HCT: 38.9 % (ref 36.0–46.0)
Hemoglobin: 13 g/dL (ref 12.0–15.0)
Immature Granulocytes: 0 %
Lymphocytes Relative: 38 %
Lymphs Abs: 1.9 10*3/uL (ref 0.7–4.0)
MCH: 30 pg (ref 26.0–34.0)
MCHC: 33.4 g/dL (ref 30.0–36.0)
MCV: 89.6 fL (ref 80.0–100.0)
Monocytes Absolute: 0.3 10*3/uL (ref 0.1–1.0)
Monocytes Relative: 7 %
Neutro Abs: 2.6 10*3/uL (ref 1.7–7.7)
Neutrophils Relative %: 51 %
Platelets: 178 10*3/uL (ref 150–400)
RBC: 4.34 MIL/uL (ref 3.87–5.11)
RDW: 13.8 % (ref 11.5–15.5)
WBC: 5.1 10*3/uL (ref 4.0–10.5)
nRBC: 0 % (ref 0.0–0.2)

## 2022-08-06 LAB — COMPREHENSIVE METABOLIC PANEL
ALT: 25 U/L (ref 0–44)
AST: 29 U/L (ref 15–41)
Albumin: 5.2 g/dL — ABNORMAL HIGH (ref 3.5–5.0)
Alkaline Phosphatase: 81 U/L (ref 38–126)
Anion gap: 12 (ref 5–15)
BUN: 12 mg/dL (ref 6–20)
CO2: 24 mmol/L (ref 22–32)
Calcium: 10.5 mg/dL — ABNORMAL HIGH (ref 8.9–10.3)
Chloride: 104 mmol/L (ref 98–111)
Creatinine, Ser: 0.86 mg/dL (ref 0.44–1.00)
GFR, Estimated: >60 mL/min (ref >60)
Glucose, Bld: 77 mg/dL (ref 70–99)
Potassium: 4 mmol/L (ref 3.5–5.1)
Sodium: 140 mmol/L (ref 135–145)
Total Bilirubin: 0.5 mg/dL (ref 0.3–1.2)
Total Protein: 8.4 g/dL — ABNORMAL HIGH (ref 6.5–8.1)

## 2022-08-06 LAB — TROPONIN I (HIGH SENSITIVITY)
Troponin I (High Sensitivity): 5 ng/L (ref <18)
Troponin I (High Sensitivity): 4 ng/L (ref <18)

## 2022-08-06 LAB — RESP PANEL BY RT-PCR (RSV, FLU A&B, COVID)  RVPGX2
Influenza A by PCR: NEGATIVE
Influenza B by PCR: NEGATIVE
Resp Syncytial Virus by PCR: NEGATIVE
SARS Coronavirus 2 by RT PCR: NEGATIVE

## 2022-08-06 MED ORDER — KETOROLAC TROMETHAMINE 30 MG/ML IJ SOLN
30.0000 mg | Freq: Once | INTRAMUSCULAR | Status: AC
  Administered 2022-08-06: 30 mg via INTRAVENOUS
  Filled 2022-08-06: qty 1

## 2022-08-06 MED ORDER — IOHEXOL 350 MG/ML SOLN
100.0000 mL | Freq: Once | INTRAVENOUS | Status: AC | PRN
  Administered 2022-08-06: 75 mL via INTRAVENOUS

## 2022-08-06 NOTE — ED Provider Notes (Signed)
[facility] Provider Note   CSN: [id number] Arrival date & time: 08/06/22  1224     History  No chief complaint on file.   Emireth Safrit is a 62 y.o. female.  HPI   62 year old female with past medical history of autoimmune disease, HLD, migraines, HTN presents to the emergency department with concern for chest pain, shortness of breath and lightheadedness.  Patient has been having migraines and dizziness for the past couple months. Describes it as a head fullness and whooshing sensation.  She had an MRI, MRA done that was unremarkable 2 months ago.  She states the symptoms are ongoing but since Sunday, for the last week she has been having symptoms more consistent with lightheadedness.  She states that when she exerts herself she feels extreme fatigue like she is going to faint.  She is endorsing sharp pain along her anterior chest and back.  Denies any history of DVT/PE but she does use estradiol patches.  No leg swelling.  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 No change in speech, change/loss of vision, facial droop, focal weakness/numbness.   Home Medications Prior to Admission medications   Medication Sig Start Date End Date Taking? Authorizing Provider  acetaminophen (TYLENOL) 500 MG tablet Take 1 tablet (500 mg total) by mouth every 6 (six) hours as needed. 04/01/17   Waynetta Pean, PA-C  ALPRAZolam Duanne Moron) 0.5 MG tablet Take 0.5 mg by mouth as needed for anxiety.    [provider]  amLODipine (NORVASC) 5 MG tablet Take 1 tablet (5 mg total) by mouth daily. 05/17/22   Tysinger, [name], PA-C  Ascorbic Acid (VITAMIN C) 1000 MG tablet Take 1,000 mg by mouth daily.    [provider]  atorvastatin (LIPITOR) 20 MG tablet TAKE 1 TABLET BY MOUTH EVERY DAY 07/11/22   Tysinger, [name], PA-C  B Complex Vitamins (B COMPLEX PO) Take 1 tablet by mouth daily.    [provider]  calcium carbonate (OS-CAL) 600 MG TABS Take 600 mg by mouth daily.    [provider]  CINNAMON PO Take 1 tablet by mouth daily.     [provider]  esomeprazole (NEXIUM) 20 MG capsule Take 1 capsule (20 mg total) by mouth daily at 12 noon. 07/05/22   Tysinger, [name], PA-C  estradiol (VIVELLE-DOT) 0.025 MG/24HR PLACE 1 PATCH ONTO THE SKIN 2 TIMES A WEEK. 08/02/22   [name], MD  Estradiol (YUVAFEM) 10 MCG TABS vaginal tablet PLACE ONE TABLET VAGINALLY TWICE WEEKLY AT BEDTIME 08/02/22   [name], MD  fish oil-omega-3 fatty acids 1000 MG capsule Take 1 g by mouth daily.    [provider]  hydroxychloroquine (PLAQUENIL) 200 MG tablet TAKE 1 TABLET BY MOUTH TWICE A DAY ON MONDAYS THROUGH FRIDAYS ONLY 08/01/22   [name], MD  Multiple Vitamin (MULTIVITAMIN) tablet Take 1 tablet by mouth daily.    [provider]  pantoprazole (PROTONIX) 40 MG tablet Take 1 tablet (40 mg total) by mouth daily. 07/05/22 07/05/23  Tysinger, [name], PA-C  Probiotic Product (PROBIOTIC ADVANCED PO) Take 1 tablet by mouth daily.     [provider]  TURMERIC PO Take 1 tablet by mouth daily.     [provider]      Allergies    Quinolones, Garlic, Latex, Levaquin [levofloxacin], Losartan, Metoprolol, Onion, Penicillins, Percocet [oxycodone-acetaminophen], Sulfa drugs cross reactors, Vicodin [hydrocodone-acetaminophen], and  Gluten meal    Review of Systems   Review of Systems  Constitutional:  Positive for fatigue. Negative for fever.  Respiratory:  Positive for chest tightness and shortness of breath. Negative for cough.   Cardiovascular:  Negative for chest pain, palpitations and leg swelling.  Gastrointestinal:  Negative for abdominal pain, diarrhea and vomiting.  Genitourinary:  Negative for flank pain.  Musculoskeletal:  Positive for back pain.  Skin:  Negative for rash.  Neurological:  Positive for dizziness and  light-headedness. Negative for syncope, facial asymmetry, speech difficulty, weakness, numbness and headaches.    Physical Exam Updated Vital Signs BP 138/82   Pulse (!) 59   Temp 98 F (36.7 C) (Oral)   Resp 18   LMP 10/25/2012 (Approximate)   SpO2 100%  Physical Exam  ED Results / Procedures / Treatments   Labs (all labs ordered are listed, but only abnormal results are displayed) Labs Reviewed  RESP PANEL BY RT-PCR (RSV, FLU A&B, COVID)  [id number]  CBC WITH DIFFERENTIAL/PLATELET  COMPREHENSIVE METABOLIC PANEL  TROPONIN I (HIGH SENSITIVITY)    EKG EKG Interpretation  Date/Time:  Saturday August 06 2022 13:17:54 EST Ventricular Rate:  66 PR Interval:  174 QRS Duration: 88 QT Interval:  428 QTC Calculation: 448 R Axis:   58 Text Interpretation: Normal sinus rhythm Possible Left atrial enlargement Borderline ECG When compared with ECG of 16-Apr-2020 11:36, No significant change was found Confirmed by Lavenia Atlas [phone]) on 08/06/2022 3:15:52 PM  Radiology DG Chest Port 1 View  Result Date: 08/06/2022 CLINICAL DATA:  Chest pain and dizziness EXAM: PORTABLE CHEST 1 VIEW COMPARISON:  11/26/2019 and prior radiographs FINDINGS: The cardiomediastinal silhouette is unremarkable. An equivocal nodular opacity overlying the UPPER LEFT lung is noted. There is no evidence of airspace disease, consolidation,, pleural effusion or pneumothorax. No acute bony abnormalities are present. Cervical fusion hardware noted. IMPRESSION: 1. Equivocal nodular opacity overlying the UPPER LEFT lung. PA and LATERAL chest radiograph recommended for initial evaluation. 2. No other significant abnormalities. Electronically Signed   By: Margarette Canada M.D.   On: 08/06/2022 16:03    Procedures Procedures    Medications Ordered in ED Medications - No data to display  ED Course/ Medical Decision Making/ A&P                             Medical Decision Making Amount and/or Complexity of Data  Reviewed Labs: ordered. Radiology: ordered.  Risk Prescription drug management.   62 year old female presents to the emergency department with concern for chest pain, lightheadedness with exertion and shortness of breath.  Vitals are stable and normal on arrival.  Of concern she has history of autoimmune disease, assumed lupus and does use estradiol patches.  Concern for PE.  Cardiac workup is negative, blood work is reassuring.  CT PE study identifies no acute abnormality.  After dose of Toradol patient feels significantly improved.  Possible musculoskeletal origin.  Will plan for outpatient follow-up.  Patient at this time appears safe and stable for discharge and close outpatient follow up. Discharge plan and strict return to ED precautions discussed, patient verbalizes understanding and agreement.        Final Clinical Impression(s) / ED Diagnoses Final diagnoses:  None    Rx / DC Orders ED Discharge Orders     None         Lorelle Gibbs, DO 08/06/22 2040

## 2022-08-06 NOTE — Discharge Instructions (Addendum)
You have been seen and discharged from the emergency department.  Your chest, heart and lung workup was normal.  Your blood work was normal.  Follow-up with your primary provider for further evaluation and further care. Take home medications as prescribed. If you have any worsening symptoms or further concerns for your health please return to an emergency department for further evaluation.

## 2022-08-06 NOTE — ED Triage Notes (Signed)
Dizzy and lightheaded since Sunday, she has vertigo but this is different. When she exerts herself, that's when she feel like she is going to faint. Some pressure above both breasts on her chest

## 2022-08-08 ENCOUNTER — Telehealth: Payer: Self-pay | Admitting: Medical

## 2022-08-08 NOTE — Telephone Encounter (Signed)
Transition Care Management Follow-up Telephone Call Date of discharge and from where: 08/06/2022 [facility] How have you been since you were released from the hospital? Still having issues pt was actually speaking to neuro on the other line making a follow up appointment with them  Any questions or concerns? No  Items Reviewed: Did the pt receive and understand the discharge instructions provided? Yes  did not discuss  Medications obtained and verified? No neuro on hold Other? No  Any new allergies since your discharge? No  Dietary orders reviewed? No Do you have support at home? Yes   Home Care and Equipment/Supplies: Were home health services ordered? not applicable Follow up appointments reviewed: [facility] f/u appt confirmed? Yes  pt had neuro on the other line making an appointment with them when I Called  Are transportation arrangements needed? No  If their condition worsens, is the pt aware to call PCP or go to the Emergency Dept.? Yes Was the patient provided with contact information for the PCP's office or ED? No call was ended quickly due to other office being on phone Was to pt encouraged to call back with questions or concerns? No

## 2022-08-09 ENCOUNTER — Encounter: Payer: Self-pay | Admitting: Rheumatology

## 2022-08-09 ENCOUNTER — Ambulatory Visit: Payer: BC Managed Care – PPO | Attending: Rheumatology | Admitting: Rheumatology

## 2022-08-09 VITALS — BP 125/86 | HR 51 | Resp 16 | Ht 64.0 in | Wt 133.4 lb

## 2022-08-09 DIAGNOSIS — M359 Systemic involvement of connective tissue, unspecified: Secondary | ICD-10-CM | POA: Diagnosis not present

## 2022-08-09 DIAGNOSIS — M19041 Primary osteoarthritis, right hand: Secondary | ICD-10-CM

## 2022-08-09 DIAGNOSIS — Z79899 Other long term (current) drug therapy: Secondary | ICD-10-CM | POA: Diagnosis not present

## 2022-08-09 DIAGNOSIS — M7062 Trochanteric bursitis, left hip: Secondary | ICD-10-CM

## 2022-08-09 DIAGNOSIS — M3500 Sicca syndrome, unspecified: Secondary | ICD-10-CM | POA: Diagnosis not present

## 2022-08-09 DIAGNOSIS — M7918 Myalgia, other site: Secondary | ICD-10-CM

## 2022-08-09 DIAGNOSIS — M170 Bilateral primary osteoarthritis of knee: Secondary | ICD-10-CM

## 2022-08-09 DIAGNOSIS — M7061 Trochanteric bursitis, right hip: Secondary | ICD-10-CM

## 2022-08-09 DIAGNOSIS — Z8679 Personal history of other diseases of the circulatory system: Secondary | ICD-10-CM

## 2022-08-09 DIAGNOSIS — Z8659 Personal history of other mental and behavioral disorders: Secondary | ICD-10-CM

## 2022-08-09 DIAGNOSIS — M62838 Other muscle spasm: Secondary | ICD-10-CM

## 2022-08-09 DIAGNOSIS — M19042 Primary osteoarthritis, left hand: Secondary | ICD-10-CM

## 2022-08-09 DIAGNOSIS — M7711 Lateral epicondylitis, right elbow: Secondary | ICD-10-CM | POA: Diagnosis not present

## 2022-08-09 DIAGNOSIS — E559 Vitamin D deficiency, unspecified: Secondary | ICD-10-CM

## 2022-08-09 DIAGNOSIS — Z8639 Personal history of other endocrine, nutritional and metabolic disease: Secondary | ICD-10-CM

## 2022-08-09 DIAGNOSIS — Z8719 Personal history of other diseases of the digestive system: Secondary | ICD-10-CM

## 2022-08-09 DIAGNOSIS — Z8261 Family history of arthritis: Secondary | ICD-10-CM

## 2022-08-09 DIAGNOSIS — Z840 Family history of diseases of the skin and subcutaneous tissue: Secondary | ICD-10-CM

## 2022-08-09 MED ORDER — TRIAMCINOLONE ACETONIDE 40 MG/ML IJ SUSP
10.0000 mg | INTRAMUSCULAR | Status: AC | PRN
  Administered 2022-08-09: 10 mg via INTRAMUSCULAR

## 2022-08-09 MED ORDER — LIDOCAINE HCL 1 % IJ SOLN
0.5000 mL | INTRAMUSCULAR | Status: AC | PRN
  Administered 2022-08-09: .5 mL

## 2022-08-09 NOTE — Patient Instructions (Addendum)
Vaccines You are taking a medication(s) that can suppress your immune system.  The following immunizations are recommended: Flu annually Covid-19  RSV Td/Tdap (tetanus, diphtheria, pertussis) every 10 years Pneumonia (Prevnar 15 then Pneumovax 23 at least 1 year apart.  Alternatively, can take Prevnar 20 without needing additional dose) Shingrix: 2 doses from 4 weeks to 6 months apart  Please check with your PCP to make sure you are up to date.    Cervical Strain and Sprain Rehab Ask your health care provider which exercises are safe for you. Do exercises exactly as told by your health care provider and adjust them as directed. It is normal to feel mild stretching, pulling, tightness, or discomfort as you do these exercises. Stop right away if you feel sudden pain or your pain gets worse. Do not begin these exercises until told by your health care provider. Stretching and range-of-motion exercises Cervical side bending  Using good posture, sit on a stable chair or stand up. Without moving your shoulders, slowly tilt your left / right ear to your shoulder until you feel a stretch in the neck muscles on the opposite side. You should be looking straight ahead. Hold for __________ seconds. Repeat with the other side of your neck. Repeat __________ times. Complete this exercise __________ times a day. Cervical rotation  Using good posture, sit on a stable chair or stand up. Slowly turn your head to the side as if you are looking over your left / right shoulder. Keep your eyes level with the ground. Stop when you feel a stretch along the side and the back of your neck. Hold for __________ seconds. Repeat this by turning to your other side. Repeat __________ times. Complete this exercise __________ times a day. Thoracic extension and pectoral stretch  Roll a towel or a small blanket so it is about 4 inches (10 cm) in diameter. Lie down on your back on a firm surface. Put the towel in the  middle of your back across your spine. It should not be under your shoulder blades. Put your hands behind your head and let your elbows fall out to your sides. Hold for __________ seconds. Repeat __________ times. Complete this exercise __________ times a day. Strengthening exercises Upper cervical flexion  Lie on your back with a thin pillow behind your head or a small, rolled-up towel under your neck. Gently tuck your chin toward your chest and nod your head down to look toward your feet. Do not lift your head off the pillow. Hold for __________ seconds. Release the tension slowly. Relax your neck muscles completely before you repeat this exercise. Repeat __________ times. Complete this exercise __________ times a day. Cervical extension  Stand about 6 inches (15 cm) away from a wall, with your back facing the wall. Place a soft object, about 6-8 inches (15-20 cm) in diameter, between the back of your head and the wall. A soft object could be a small pillow, a ball, or a folded towel. Gently tilt your head back and press into the soft object. Keep your jaw and forehead relaxed. Hold for __________ seconds. Release the tension slowly. Relax your neck muscles completely before you repeat this exercise. Repeat __________ times. Complete this exercise __________ times a day. Posture and body mechanics Body mechanics refer to the movements and positions of your body while you do your daily activities. Posture is part of body mechanics. Good posture and healthy body mechanics can help to relieve stress in your body's tissues  and joints. Good posture means that your spine is in its natural S-curve position (your spine is neutral), your shoulders are pulled back slightly, and your head is not tipped forward. The following are general guidelines for using improved posture and body mechanics in your everyday activities. Sitting  When sitting, keep your spine neutral and keep your feet flat on the  floor. Use a footrest, if needed, and keep your thighs parallel to the floor. Avoid rounding your shoulders. Avoid tilting your head forward. When working at a desk or a computer, keep your desk at a height where your hands are slightly lower than your elbows. Slide your chair under your desk so you are close enough to maintain good posture. When working at a computer, place your monitor at a height where you are looking straight ahead and you do not have to tilt your head forward or downward to look at the screen. Standing  When standing, keep your spine neutral and keep your feet about hip-width apart. Keep a slight bend in your knees. Your ears, shoulders, and hips should line up. When you do a task in which you stand in one place for a long time, place one foot up on a stable object that is 2-4 inches (5-10 cm) high, such as a footstool. This helps keep your spine neutral. Resting When lying down and resting, avoid positions that are most painful for you. Try to support your neck in a neutral position. You can use a contour pillow or a small rolled-up towel. Your pillow should support your neck but not push on it. This information is not intended to replace advice given to you by your health care provider. Make sure you discuss any questions you have with your health care provider. Document Revised: 01/03/2022 Document Reviewed: 01/03/2022 Elsevier Patient Education  [place].

## 2022-08-10 LAB — SEDIMENTATION RATE: Sed Rate: 9 mm/h (ref 0–30)

## 2022-08-10 LAB — C3 AND C4
C3 Complement: 145 mg/dL (ref 83–193)
C4 Complement: 26 mg/dL (ref 15–57)

## 2022-08-10 LAB — PROTEIN / CREATININE RATIO, URINE
Creatinine, Urine: 130 mg/dL (ref 20–275)
Protein/Creat Ratio: 92 mg/g creat (ref 24–184)
Protein/Creatinine Ratio: 0.092 mg/mg creat (ref 0.024–0.184)
Total Protein, Urine: 12 mg/dL (ref 5–24)

## 2022-08-10 LAB — CK: Total CK: 112 U/L (ref 29–143)

## 2022-08-10 LAB — ANTI-DNA ANTIBODY, DOUBLE-STRANDED: ds DNA Ab: 7 IU/mL — ABNORMAL HIGH

## 2022-08-10 NOTE — Progress Notes (Signed)
Double-stranded DNA is positive and stable.  Complements normal, sed rate normal, CK normal, protein creatinine ratio normal.  No change in treatment advised.

## 2022-09-27 ENCOUNTER — Encounter: Payer: Self-pay | Admitting: Nurse Practitioner

## 2022-09-27 ENCOUNTER — Ambulatory Visit: Payer: BC Managed Care – PPO | Admitting: Nurse Practitioner

## 2022-09-27 VITALS — BP 122/80 | HR 68 | Wt 132.4 lb

## 2022-09-27 DIAGNOSIS — R220 Localized swelling, mass and lump, head: Secondary | ICD-10-CM

## 2022-09-27 DIAGNOSIS — R519 Headache, unspecified: Secondary | ICD-10-CM | POA: Diagnosis not present

## 2022-09-27 LAB — SPECIMEN STATUS REPORT

## 2022-09-27 LAB — CBC WITH DIFFERENTIAL/PLATELET
Basophils Absolute: 0 10*3/uL (ref 0.0–0.2)
Basos: 1 %
EOS (ABSOLUTE): 0.1 10*3/uL (ref 0.0–0.4)
Eos: 3 %
Hematocrit: 38.7 % (ref 34.0–46.6)
Hemoglobin: 12.3 g/dL (ref 11.1–15.9)
Immature Grans (Abs): 0 10*3/uL (ref 0.0–0.1)
Immature Granulocytes: 0 %
Lymphocytes Absolute: 1.6 10*3/uL (ref 0.7–3.1)
Lymphs: 44 %
MCH: 28.5 pg (ref 26.6–33.0)
MCHC: 31.8 g/dL (ref 31.5–35.7)
MCV: 90 fL (ref 79–97)
Monocytes Absolute: 0.3 10*3/uL (ref 0.1–0.9)
Monocytes: 8 %
Neutrophils Absolute: 1.7 10*3/uL (ref 1.4–7.0)
Neutrophils: 44 %
Platelets: 171 10*3/uL (ref 150–450)
RBC: 4.32 x10E6/uL (ref 3.77–5.28)
RDW: 13.2 % (ref 11.7–15.4)
WBC: 3.8 10*3/uL (ref 3.4–10.8)

## 2022-09-27 LAB — SEDIMENTATION RATE: Sed Rate: 4 mm/hr (ref 0–40)

## 2022-09-27 LAB — D-DIMER, QUANTITATIVE: D-DIMER: <0.2 mg/L FEU (ref 0.00–0.49)

## 2022-09-27 LAB — C-REACTIVE PROTEIN: CRP: <1 mg/L (ref 0–10)

## 2022-09-27 NOTE — Patient Instructions (Signed)
I will be in touch with you to let you know our next steps.

## 2022-09-27 NOTE — Progress Notes (Signed)
Shari Render, DNP, AGNP-c [facility] [address] [place], [place] [address] [phone]  Subjective:   Shari Lawrence is a 62 y.o. female presents to day for evaluation of: Knot on Forehead Cleaster presents today with concerns for a new knot that she noticed on her forehead above her right eye last Wednesday morning upon awakening. She reports that initially the area was bluish in color, but has since returned to a normal coloration. The knot is firm and very tender. It is located above the right eyebrow more laterally. She denies any known injury, falls, or previous incidences similar to this. She had a nurse at church look at this when it had not resolved on Sunday and she recommended further evaluation with her PCP.   She has not had any new headaches, but does report chronic issues primarily on the right side including her arm, neck, jaw, and ear. Her vision has not changed and she is not having any floaters or flashes of light. She does endorse dizziness upon standing on occasion, but this has been ongoing for some time.   She has a history positive for suspect autoimmune disorder for which she is taking plaquenil for treatment and is managed with rheumatology.    PMH, Medications, and Allergies reviewed and updated in chart as appropriate.   ROS negative except for what is listed in HPI. Objective:  BP 122/80   Pulse 68   Wt 132 lb 6.4 oz (60.1 kg)   LMP 10/25/2012 (Approximate)   BMI 22.73 kg/m  Physical Exam Vitals and nursing note reviewed.  Constitutional:      General: She is not in acute distress.    Appearance: Normal appearance. She is normal weight. She is not ill-appearing.  HENT:     Head: Atraumatic. No Battle's sign, contusion, right periorbital erythema, left periorbital erythema or laceration. Hair is normal.     Jaw: There is normal jaw occlusion. Tenderness present. No swelling.     Salivary Glands: Right salivary gland is not  diffusely enlarged or tender. Left salivary gland is not diffusely enlarged or tender.      Right Ear: Hearing and tympanic membrane normal.     Left Ear: Hearing and tympanic membrane normal.     Nose: Nose normal.     Mouth/Throat:     Lips: Pink.     Mouth: Mucous membranes are moist.     Pharynx: Oropharynx is clear.  Eyes:     Extraocular Movements: Extraocular movements intact.     Conjunctiva/sclera: Conjunctivae normal.     Pupils: Pupils are equal, round, and reactive to light.  Neck:     Vascular: No carotid bruit.  Cardiovascular:     Rate and Rhythm: Normal rate and regular rhythm.     Pulses: Normal pulses.     Heart sounds: Normal heart sounds.  Pulmonary:     Effort: Pulmonary effort is normal.     Breath sounds: Normal breath sounds.  Musculoskeletal:     Cervical back: Normal range of motion. Tenderness present.     Right lower leg: No edema.     Left lower leg: No edema.  Lymphadenopathy:     Cervical: No cervical adenopathy.  Skin:    General: Skin is warm and dry.     Capillary Refill: Capillary refill takes less than 2 seconds.  Neurological:     General: No focal deficit present.     Mental Status: She is alert and oriented to  person, place, and time.     Cranial Nerves: No cranial nerve deficit.     Sensory: No sensory deficit.     Motor: No weakness.     Coordination: Coordination normal.     Gait: Gait normal.  Psychiatric:        Mood and Affect: Mood normal.           Assessment & Plan:   Problem List Items Addressed This Visit     Forehead pain    Pain noted to area of 34mm nodularity superior to the right eyebrow on the lateral side. See Subcutaneous nodule of head for complete assessment and plan.       Relevant Orders   C-reactive protein (Completed)   Sedimentation rate (Completed)   CBC with Differential/Platelet (Completed)   D-dimer, quantitative (Completed)   VAS [place] TRANSCRANIAL DOPPLER   Subcutaneous nodule of head -  Primary    [facility] has an appx 21mm firm nodularity noted just superior to the right eyebrow on the lateral side of the midline. The nodularity does not appear fixed. The area is quite tender to mild palpation without any noted injury or discoloration. There is concern present for possible temporal arteritis, although the location is not exactly what we would typically expect. Seeing that she does have tenderness and a nodularity, I feel that it is most appropriate to obtain a CRP and ESR for evaluation for possible vasculitis. I have also considered a possible thrombus to the area or lupus vasculitis. She does have chronic right sided arm, neck, and facial discomfort, which also raises alarm. I discussed my concerns and the reassurances with the patient based on the examination and symptoms. At this time I feel it is most appropriate to obtain stat labs and refer for doppler ultrasound of the area to expedite any treatment in the event this is a vasculitis (specifically temporal arteritis). The patient expressed understanding of my concerns and agreement to the plan.  Plan: - Obtain STAT ESR, CRP, and D Dimer today - Order doppler ultrasound of the forehead- they will call you to schedule this. I have not ordered this STAT, but have asked for expedited imaging.  - If ESR and CRP are normal, this does reduce the concern for temporal arteritis.  - We will await results and proceed accordingly.       Relevant Orders   C-reactive protein (Completed)   Sedimentation rate (Completed)   CBC with Differential/Platelet (Completed)   D-dimer, quantitative (Completed)   VAS [place] TRANSCRANIAL DOPPLER      Shari Render, DNP, AGNP-c 09/28/2022  7:18 AM    Time: 42 minutes, >50% spent counseling, care coordination, chart review, and documentation.    History, Medications, Surgery, SDOH, and Family History reviewed and updated as appropriate.

## 2022-09-28 ENCOUNTER — Telehealth: Payer: Self-pay | Admitting: Medical

## 2022-09-28 DIAGNOSIS — R220 Localized swelling, mass and lump, head: Secondary | ICD-10-CM | POA: Insufficient documentation

## 2022-09-28 NOTE — Telephone Encounter (Signed)
Pt called to check on ultrasound , I could not locate order in system, will you look at Please

## 2022-09-28 NOTE — Telephone Encounter (Signed)
Can you check the status of this and let patient know  VAS [place] TRANSCRANIAL DOPPLER (Order QB:[id number])

## 2022-09-28 NOTE — Assessment & Plan Note (Signed)
Pain noted to area of 66mm nodularity superior to the right eyebrow on the lateral side. See Subcutaneous nodule of head for complete assessment and plan.

## 2022-09-28 NOTE — Assessment & Plan Note (Signed)
Shari Lawrence has an appx 8mm firm nodularity noted just superior to the right eyebrow on the lateral side of the midline. The nodularity does not appear fixed. The area is quite tender to mild palpation without any noted injury or discoloration. There is concern present for possible temporal arteritis, although the location is not exactly what we would typically expect. Seeing that she does have tenderness and a nodularity, I feel that it is most appropriate to obtain a CRP and ESR for evaluation for possible vasculitis. I have also considered a possible thrombus to the area or lupus vasculitis. She does have chronic right sided arm, neck, and facial discomfort, which also raises alarm. I discussed my concerns and the reassurances with the patient based on the examination and symptoms. At this time I feel it is most appropriate to obtain stat labs and refer for doppler ultrasound of the area to expedite any treatment in the event this is a vasculitis (specifically temporal arteritis). The patient expressed understanding of my concerns and agreement to the plan.  Plan: - Obtain STAT ESR, CRP, and D Dimer today - Order doppler ultrasound of the forehead- they will call you to schedule this. I have not ordered this STAT, but have asked for expedited imaging.  - If ESR and CRP are normal, this does reduce the concern for temporal arteritis.  - We will await results and proceed accordingly.

## 2022-10-05 ENCOUNTER — Other Ambulatory Visit: Payer: Self-pay | Admitting: Medical

## 2022-10-11 ENCOUNTER — Ambulatory Visit: Payer: BC Managed Care – PPO | Admitting: Neurology

## 2022-10-11 ENCOUNTER — Telehealth: Payer: Self-pay | Admitting: Rheumatology

## 2022-10-11 ENCOUNTER — Encounter: Payer: Self-pay | Admitting: Neurology

## 2022-10-11 VITALS — BP 127/89 | HR 60 | Ht 64.0 in | Wt 132.0 lb

## 2022-10-11 DIAGNOSIS — M542 Cervicalgia: Secondary | ICD-10-CM | POA: Insufficient documentation

## 2022-10-11 DIAGNOSIS — G43709 Chronic migraine without aura, not intractable, without status migrainosus: Secondary | ICD-10-CM

## 2022-10-11 MED ORDER — RIZATRIPTAN BENZOATE 5 MG PO TBDP: 5.0000 mg | ORAL_TABLET | ORAL | 11 refills | Status: DC | PRN

## 2022-10-11 MED ORDER — VENLAFAXINE HCL ER 37.5 MG PO CP24: 37.5000 mg | ORAL_CAPSULE | Freq: Every day | ORAL | 11 refills | Status: DC

## 2022-10-11 NOTE — Telephone Encounter (Signed)
Okay to refer to physical therapy

## 2022-10-11 NOTE — Progress Notes (Signed)
Chief Complaint  Patient presents with   Follow-up    RM 17, alone NECK PAIN when standing she stated she gets a flush feeling pressure radiating from neck base through head and feels faint (ongoing for 10 months)     ASSESSMENT AND PLAN  Shari Lawrence is a 62 y.o. female   History of cervical decompression surgery,Intermittent neck pain Chronic migraine headaches  MRI of cervical spine in February 2022: Showed ACDF C5-6 and 7, C4-5 with mild degenerative changes, no significant canal and foraminal narrowing  Evidence of tight painful cervical paraspinal muscles, which can certainly contributed to her complaints, no evidence of active cervical radiculopathy or spondylitic myelopathy, most likely musculoskeletal etiology, can have a migraine component   Effexor XR 37.5 mg daily as preventive medications,  Maxalt as needed  Warm compression, massage,  Return to clinic in 2 to 3 months, if she continues to be symptomatic after above treatment, may consider low dose botulinum toxin injection  DIAGNOSTIC DATA (LABS, IMAGING, TESTING) - I reviewed patient records, labs, notes, testing and imaging myself where available.  HISTORICAL Shari Lawrence is a 62 year old female, seen in request by rheumatologist Dr. Pollyann Savoy, and primary care physician Dr. Jac Canavan, for evaluation of neck pain, radiating pain to right arm, initial evaluation was on April 08, 2019.   Past medical history of hypertension, hyperlipidemia, autoimmune disease, taking Plaquenil.  She did have a history of cervical decompression surgery in 2010, prior to the surgery, she presented with right cervical radiculopathy.   Since April 2020, she began to notice recurrent right neck pain, radiating pain to right shoulder, right arm, sometimes woke her up from sleep, she is helping her elderly parents, sometimes bearing weight of her father with her right shoulder, in addition, she complains of  intermittent bilateral lower extremity numbness since 25-Jul-2020denies significant gait abnormality, no bowel bladder incontinence.   She also complains of low back pain, but denies shooting pain to bilateral lower extremities.  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  UPDATE January 05 2021: She is overall doing much better, neck pain has improved, intermittent low back pain depending on her activity, stretching, lying flat usually helps, occasionally neck pain at the base of occipital region, rarely radiating pain, still has right hand intermittent numbness, previous EMG nerve conduction study February 2021 confirmed bilateral carpal tunnel syndromes, right side is more severe moderate  UPDATE October 11 2022: Shari Lawrence came in today complains of consistent neck pain, more on the right side, muscle tension, radiating pain to right parietal temporal region, with right eye discomfort, relieved by lying down,  She had long history of severe migraine headache, presenting with a lateralized retro-orbital area headache, light noise smell sensitivity,  Personally reviewed MRA of the brain and neck that was normal in February 2024   I reviewed her  record from her rheumatologist Dr. Corliss Skains, history of mild fascia muscle pain, autoimmune  disease, sicca complex, complains of muscle pain, generalized weakness,  ANA was positive with titer 1:320, positive double-stranded DNA, rheumatoid factor was negative, CCP was negative, history of nasal and oral ulcer in the past, positive synovitis in the left hand,  Was treated with Plaquenil 200 mg twice a day,   PHYSICAL EXAM   Vitals:   10/11/22 0935  Weight: 132 lb (59.9 kg)  Height: 5\' 4"  (1.626 m)    Body mass index is 22.83 kg/m.  PHYSICAL EXAMNIATION:  Gen: NAD, conversant, well nourised, well groomed                     Cardiovascular: Regular rate rhythm, no peripheral edema, warm, nontender. Eyes: Conjunctivae clear without exudates or hemorrhage Neck: Supple, no carotid bruits.  Significant tenderness at bilateral levator scapula, lower cervical paraspinal muscles pulmonary: Clear to auscultation bilaterally   NEUROLOGICAL EXAM:  MENTAL STATUS: Speech/cognition: Awake, alert, oriented to history taking and care conversation   CRANIAL NERVES: CN II: Visual fields are full to confrontation. Pupils are round equal and briskly reactive to light. CN III, IV, VI: extraocular movement are normal. No ptosis. CN V: Facial sensation is intact to light touch CN VII: Face is symmetric with normal eye closure  CN VIII: Hearing is normal to causal conversation. CN IX, X: Phonation is normal. CN XI: Head turning and shoulder shrug are intact  MOTOR: There is no pronator drift of out-stretched arms. Muscle bulk and tone are normal. Muscle strength is normal.  REFLEXES: Reflexes are 2+ and symmetric at the biceps, triceps, 3/3 knees, and ankles. Plantar responses are flexor.  SENSORY: Intact to light touch, pinprick and vibratory sensation are intact in fingers and toes.  COORDINATION: There is no trunk or limb dysmetria noted.  GAIT/STANCE: She can get up from seated position arm crossed, steady  REVIEW OF SYSTEMS:  Full 14 system review of systems performed and  notable only for as above All other review of systems were negative.  ALLERGIES: Allergies  Allergen Reactions   Quinolones Anaphylaxis and Other (See Comments)    Whelps developed all over her body   Garlic    Latex Itching and Other (See Comments)    Skin yellowing   Levaquin [Levofloxacin] Hives    Throat swelling   Losartan     Intolerance, fluctuating BPs   Metoprolol     Self reported hypotension   Onion    Penicillins    Percocet [Oxycodone-Acetaminophen]    Sulfa Drugs Cross Reactors Itching, Swelling and Other (See Comments)    Red face   Vicodin [Hydrocodone-Acetaminophen] Itching and Nausea And Vomiting   Gluten Meal     Other reaction(s): Other (See Comments) IBS    HOME MEDICATIONS: Current Outpatient Medications  Medication Sig Dispense Refill   acetaminophen (TYLENOL) 500 MG tablet Take 1 tablet (500 mg total) by mouth every 6 (six) hours as needed. 30 tablet 0   ALPRAZolam (XANAX) 0.5 MG tablet Take 0.5 mg by mouth as needed for anxiety.     amLODipine (NORVASC) 5 MG tablet Take 1 tablet (5 mg total) by mouth daily. 90 tablet 3   Ascorbic Acid (VITAMIN C) 1000 MG tablet Take 1,000 mg by mouth daily.     atorvastatin (LIPITOR) 20 MG tablet TAKE 1 TABLET BY MOUTH EVERY DAY 30 tablet 5   B Complex Vitamins (B COMPLEX PO) Take 1 tablet  by mouth daily.     calcium carbonate (OS-CAL) 600 MG TABS Take 600 mg by mouth daily.     CINNAMON PO Take 1 tablet by mouth daily.      esomeprazole (NEXIUM) 20 MG capsule Take 1 capsule (20 mg total) by mouth daily at 12 noon. 1 capsule 0   estradiol (VIVELLE-DOT) 0.025 MG/24HR PLACE 1 PATCH ONTO THE SKIN 2 TIMES A WEEK. 24 patch 3   Estradiol (YUVAFEM) 10 MCG TABS vaginal tablet PLACE ONE TABLET VAGINALLY TWICE WEEKLY AT BEDTIME 24 tablet 3   fish oil-omega-3 fatty acids 1000 MG capsule Take 1 g by mouth daily.     hydroxychloroquine (PLAQUENIL) 200 MG tablet TAKE 1 TABLET BY MOUTH TWICE A DAY ON MONDAYS THROUGH FRIDAYS ONLY  120 tablet 0   Multiple Vitamin (MULTIVITAMIN) tablet Take 1 tablet by mouth daily.     pantoprazole (PROTONIX) 40 MG tablet Take 1 tablet (40 mg total) by mouth daily. 90 tablet 0   Probiotic Product (PROBIOTIC ADVANCED PO) Take 1 tablet by mouth daily.      TURMERIC PO Take 1 tablet by mouth daily.      No current facility-administered medications for this visit.    PAST MEDICAL HISTORY: Past Medical History:  Diagnosis Date   Abnormal Pap smear 1988   treated with cryo   Anemia 11/2010   hematology consult prior; etiology malabsorption and uterine bleeding   Anxiety    COVID-19 10/28/2020   Gastric polyp    GERD (gastroesophageal reflux disease)    H/O bone density study 12/2007   H/O hysterectomy for benign disease 5/14   History of echocardiogram 03/2010   normal LV function, EF 60-65%, mild left atrial enlargement   HTN (hypertension) 03/2010   hospitalization for HTN urgency   Hyperlipemia    Internal hemorrhoids    Lumbar degenerative disc disease    Migraine    Myalgia    Numbness and tingling    Prediabetes    Spondylosis, cervical    Umbilical hernia    Uterine fibroid    hx/o     PAST SURGICAL HISTORY: Past Surgical History:  Procedure Laterality Date   ABDOMINAL HYSTERECTOMY     CERVICAL FUSION  2010   CERVICAL SPINE SURGERY     CERVIX LESION DESTRUCTION  1988   CESAREAN SECTION     CHOLECYSTECTOMY  2003   COLONOSCOPY  08/2010   Dr. Loreta Ave   COLONOSCOPY  02/2017   Dr. Loreta Ave   ESOPHAGOGASTRODUODENOSCOPY  08/2010   Dr. Loreta Ave   EXPLORATORY LAPAROTOMY     HERNIA REPAIR     MYOMECTOMY  1998   PELVIC LAPAROSCOPY     ROBOTIC ASSISTED LAPAROSCOPIC HYSTERECTOMY AND SALPINGECTOMY  10/2012   [facility]; laproscopic due to fibroids   UMBILICAL HERNIA REPAIR  10/2013   infected laparoscopic port, mesh placement    FAMILY HISTORY: Family History  Problem Relation Age of Onset   Hypertension Mother    Dementia Mother    Colon cancer Mother 28   Deep vein thrombosis  Father    Hypertension Father    Cancer Father        prostate CA   Heart disease Father 52       CAD   Parkinsonism Father    Hypertension Sister    Hyperlipidemia Sister    Sjogren's syndrome Sister    Hypertension Brother    Hyperlipidemia Brother    Hypertension Maternal Aunt    Hyperlipidemia Maternal  Aunt    Rheum arthritis Maternal Aunt    Diabetes Maternal Aunt        1 with type II, 1 with type 1   Hypertension Brother    Rheum arthritis Brother    Diabetes Paternal Aunt        type II   Breast cancer Neg Hx     SOCIAL HISTORY: Social History   Socioeconomic History   Marital status: Divorced    Spouse name: Not on file   Number of children: 1   Years of education: college   Highest education level: Doctorate  Occupational History    Employer: [employer]    Comment: Programmer, systems, PhD candidate, [employer]  Tobacco Use   Smoking status: Never    Passive exposure: Never   Smokeless tobacco: Never  Vaping Use   Vaping Use: Never used  Substance and Sexual Activity   Alcohol use: No   Drug use: No   Sexual activity: Not Currently    Partners: Male    Birth control/protection: Surgical    Comment: hysterectomy  Other Topics Concern   Not on file  Social History Narrative   Single, 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  Has 71 yo daughter.  Her siblings and her don't agree on helping take care of their parents, causes family tension.  As of 11/2014      Right-handed.   0.5 cup caffeine per day.   Her father lives with her.      Update 09/2019--father passed away from urosepsis in Sep 26, 2019  Social Determinants of Health   Financial Resource Strain: Not on file  Food Insecurity: Not on file  Transportation Needs: Not on file  Physical  Activity: Not on file  Stress: Not on file  Social Connections: Not on file  Intimate Partner Violence: Not on file       Levert Feinstein, M.D. Ph.D.  [facility] [address], [address] [place], [place] [address] Ph: [phone] Fax: [phone]  CC:  Tysinger, Kermit Balo, PA-C [address] [place],  [place] [address]  Tysinger, Kermit Balo, PA-C

## 2022-10-11 NOTE — Telephone Encounter (Signed)
Patient called the office requesting a referral to physical therapy. Patient states it was discussed at her last visit but did not want to do it at that time.

## 2022-10-12 ENCOUNTER — Other Ambulatory Visit: Payer: Self-pay | Admitting: *Deleted

## 2022-10-12 ENCOUNTER — Telehealth: Payer: Self-pay

## 2022-10-12 DIAGNOSIS — M7061 Trochanteric bursitis, right hip: Secondary | ICD-10-CM

## 2022-10-12 NOTE — Telephone Encounter (Signed)
Pt called requesting advice before upcoming procedure scheduled for this Friday 10/14/22. Procedure was scheduled to evaluate her veins due to a subcutaneous nodule she last saw Huntley Dec about on 09/27/22. The pt stated the nodule had resolved itself over the weekend so she is unsure whether or not she should still go to her appt for the procedure. Please advise.

## 2022-10-14 ENCOUNTER — Ambulatory Visit (HOSPITAL_COMMUNITY): Payer: BC Managed Care – PPO | Attending: Nurse Practitioner

## 2022-10-17 ENCOUNTER — Ambulatory Visit: Payer: BC Managed Care – PPO | Admitting: Medical

## 2022-10-17 ENCOUNTER — Other Ambulatory Visit: Payer: Self-pay | Admitting: Rheumatology

## 2022-10-17 VITALS — BP 122/70 | HR 60 | Temp 97.0°F | Wt 130.6 lb

## 2022-10-17 DIAGNOSIS — J019 Acute sinusitis, unspecified: Secondary | ICD-10-CM

## 2022-10-17 DIAGNOSIS — R519 Headache, unspecified: Secondary | ICD-10-CM | POA: Diagnosis not present

## 2022-10-17 DIAGNOSIS — R140 Abdominal distension (gaseous): Secondary | ICD-10-CM

## 2022-10-17 DIAGNOSIS — M359 Systemic involvement of connective tissue, unspecified: Secondary | ICD-10-CM

## 2022-10-17 DIAGNOSIS — R109 Unspecified abdominal pain: Secondary | ICD-10-CM | POA: Diagnosis not present

## 2022-10-17 DIAGNOSIS — R0981 Nasal congestion: Secondary | ICD-10-CM | POA: Diagnosis not present

## 2022-10-17 LAB — POCT URINALYSIS DIP (PROADVANTAGE DEVICE)
Glucose, UA: NEGATIVE mg/dL
Nitrite, UA: NEGATIVE
Specific Gravity, Urine: 1.02
Urobilinogen, Ur: NEGATIVE
pH, UA: 6 (ref 5.0–8.0)

## 2022-10-17 MED ORDER — CLARITHROMYCIN 500 MG PO TABS: 500.0000 mg | ORAL_TABLET | Freq: Two times a day (BID) | ORAL | 0 refills | Status: DC

## 2022-10-17 MED ORDER — FLUTICASONE PROPIONATE 50 MCG/ACT NA SUSP: 2.0000 | Freq: Every day | NASAL | 2 refills | Status: DC

## 2022-10-17 NOTE — Telephone Encounter (Signed)
Last Fill: 08/01/2022  Eye exam: 10/06/2022 WNL    Labs: 09/27/2022 CBC WNL, 08/06/2022 Calcium 10.5, Total Protein 8.4, Albumin 5.2  Next Visit: 01/11/2023  Last Visit: 08/09/2022  DX:Autoimmune disease   Current Dose per office note 08/09/2022: Plaquenil 200 mg 1 tablet by mouth daily twice daily M-F only   Okay to refill Plaquenil?

## 2022-10-17 NOTE — Progress Notes (Signed)
Subjective:  Shari Lawrence is a 62 y.o. female who presents for Chief Complaint  Patient presents with   stabbing pain in ear    Stabbing pain in ears since last Sunday. And abdominal stabbing pain around the belly button. Feels like she has a lot of gas and bloating. Started yesterday pain level 4/10     Here for multiple symptoms of concern.  She notes having stabbing pain in left ear worse, but some in right ear.  Feels like pressure and popping in right ear.  Pain started a week ago.  She is also had a migraine headache for the last week.  She has had some postnasal drainage.  Has had a migraine since last week, thought it could be allergy triggered.  Nose feels stopped up.    Saw neurology recently, felt that she had a migraine given his current symptoms.  Was put on medicine but that did not seem to help.  She is felt fatigued and drained and just felt downright horrible for the last week or so.  She is starting physical therapy soon for upper back pain and neck tension  She notes some abdominal discomfort around the umbilicus.  She first noticed this last week when she was getting out of the tub and she noticed it worse in her belly and back after doing some yard work and pulling around bags of grass.  No swelling, no urinary symptoms, no vaginal symptoms, no uterine bleeding.  No fever body aches or chills.  She has felt gassy and bloated.  She has had several bowel movements recently.  No blood in the stool.  Has not tried anything for the symptoms.  No other aggravating or relieving factors.    No other c/o.  Past Medical History:  Diagnosis Date   Abnormal Pap smear 1988   treated with cryo   Anemia 11/2010   hematology consult prior; etiology malabsorption and uterine bleeding   Anxiety    COVID-19 10/28/2020   Gastric polyp    GERD (gastroesophageal reflux disease)    H/O bone density study 12/2007   H/O hysterectomy for benign disease 5/14   History of  echocardiogram 03/2010   normal LV function, EF 60-65%, mild left atrial enlargement   HTN (hypertension) 03/2010   hospitalization for HTN urgency   Hyperlipemia    Internal hemorrhoids    Lumbar degenerative disc disease    Migraine    Myalgia    Numbness and tingling    Prediabetes    Spondylosis, cervical    Umbilical hernia    Uterine fibroid    hx/o    Current Outpatient Medications on File Prior to Visit  Medication Sig Dispense Refill   acetaminophen (TYLENOL) 500 MG tablet Take 1 tablet (500 mg total) by mouth every 6 (six) hours as needed. 30 tablet 0   ALPRAZolam (XANAX) 0.5 MG tablet Take 0.5 mg by mouth as needed for anxiety.     amLODipine (NORVASC) 5 MG tablet Take 1 tablet (5 mg total) by mouth daily. 90 tablet 3   Ascorbic Acid (VITAMIN C) 1000 MG tablet Take 1,000 mg by mouth daily.     atorvastatin (LIPITOR) 20 MG tablet TAKE 1 TABLET BY MOUTH EVERY DAY 30 tablet 5   B Complex Vitamins (B COMPLEX PO) Take 1 tablet by mouth daily.     calcium carbonate (OS-CAL) 600 MG TABS Take 600 mg by mouth daily.     CINNAMON PO Take 1 tablet  by mouth daily.      esomeprazole (NEXIUM) 20 MG capsule Take 1 capsule (20 mg total) by mouth daily at 12 noon. 1 capsule 0   estradiol (VIVELLE-DOT) 0.025 MG/24HR PLACE 1 PATCH ONTO THE SKIN 2 TIMES A WEEK. 24 patch 3   Estradiol (YUVAFEM) 10 MCG TABS vaginal tablet PLACE ONE TABLET VAGINALLY TWICE WEEKLY AT BEDTIME 24 tablet 3   fish oil-omega-3 fatty acids 1000 MG capsule Take 1 g by mouth daily.     hydroxychloroquine (PLAQUENIL) 200 MG tablet TAKE 1 TABLET BY MOUTH TWICE A DAY ON MONDAYS THROUGH FRIDAYS ONLY 120 tablet 0   Multiple Vitamin (MULTIVITAMIN) tablet Take 1 tablet by mouth daily.     pantoprazole (PROTONIX) 40 MG tablet Take 1 tablet (40 mg total) by mouth daily. 90 tablet 0   Probiotic Product (PROBIOTIC ADVANCED PO) Take 1 tablet by mouth daily.      rizatriptan (MAXALT-MLT) 5 MG disintegrating tablet Take 1 tablet (5 mg  total) by mouth as needed for migraine. May repeat in 2 hours if needed 10 tablet 11   TURMERIC PO Take 1 tablet by mouth daily.      venlafaxine XR (EFFEXOR XR) 37.5 MG 24 hr capsule Take 1 capsule (37.5 mg total) by mouth daily with breakfast. (Patient not taking: Reported on 10/17/2022) 30 capsule 11   No current facility-administered medications on file prior to visit.     The following portions of the patient's history were reviewed and updated as appropriate: allergies, current medications, past family history, past medical history, past social history, past surgical history and problem list.  ROS Otherwise as in subjective above  Objective: BP 122/70   Pulse 60   Temp (!) 97 F (36.1 C)   Wt 130 lb 9.6 oz (59.2 kg)   LMP 10/25/2012 (Approximate)   BMI 22.42 kg/m   General appearance: alert, no distress, well developed, well nourished HEENT: normocephalic, sclerae anicteric, conjunctiva pink and moist, TMs retracted with mild pinkish coloration, loss of light reflex bilat, right nare with turbinate edema, mucoid discharge, swollen compared to the left which appears normal, pharynx with some postnasal drainage oral cavity: MMM, no lesions Neck: supple, no lymphadenopathy, no thyromegaly, no masses, nontender, normal range of motion Heart: RRR, normal S1, S2, no murmurs Lungs: CTA bilaterally, no wheezes, rhonchi, or rales Abdomen: +bs, soft, mild tenderness over the suprapubic region and slightly on the right lower quadrant region, otherwise non tender, non distended, no masses, no hepatomegaly, no splenomegaly Pulses: 2+ radial pulses, 2+ pedal pulses, normal cap refill Ext: no edema   Assessment: Encounter Diagnoses  Name Primary?   Intractable headache, unspecified chronicity pattern, unspecified headache type Yes   Abdominal pain, unspecified abdominal location    Nasal congestion    Subacute sinusitis, unspecified location    Bloating      Plan: Given her ear  findings and headache and symptoms that suggest that she has a sinus infection.  Begin a round of Biaxin.  Can use some over-the-counter Mucinex, good hydration and nasal saline.  Can use Flonase as well for nasal stuffiness.  Abdominal pain-etiology unclear.  We discussed possible etiologies.  Urinalysis does not suggest infection, symptoms do not suggest appendicitis or infection either.  She has been doing a lot of yard work which could explain some mild discomfort in the back and abdomen from strain.  We discussed relative rest, stretching, over-the-counter analgesics as needed the next few days.  Advise if symptoms worsen to  call or recheck.  Imaging can be done if symptoms worsen.  Bloating-avoid gas causing foods, can use Gas-X over-the-counter  Headache, migraine-currently has sinus infection symptoms and exam findings.  Nevertheless she does saw neurology recently for migraine.  No referral to gynecology since her current gynecologist is retiring   Sarahann was seen today for stabbing pain in ear.  Diagnoses and all orders for this visit:  Intractable headache, unspecified chronicity pattern, unspecified headache type  Abdominal pain, unspecified abdominal location -     POCT Urinalysis DIP (Proadvantage Device)  Nasal congestion  Subacute sinusitis, unspecified location  Bloating  Other orders -     clarithromycin (BIAXIN) 500 MG tablet; Take 1 tablet (500 mg total) by mouth 2 (two) times daily. -     fluticasone (FLONASE) 50 MCG/ACT nasal spray; Place 2 sprays into both nostrils daily.    Follow up: prn

## 2022-10-20 ENCOUNTER — Other Ambulatory Visit: Payer: Self-pay | Admitting: Gastroenterology

## 2022-10-20 DIAGNOSIS — R1032 Left lower quadrant pain: Secondary | ICD-10-CM

## 2022-10-22 ENCOUNTER — Encounter: Payer: Self-pay | Admitting: Neurology

## 2022-10-22 DIAGNOSIS — G43709 Chronic migraine without aura, not intractable, without status migrainosus: Secondary | ICD-10-CM

## 2022-10-22 DIAGNOSIS — M542 Cervicalgia: Secondary | ICD-10-CM

## 2022-10-27 ENCOUNTER — Ambulatory Visit (HOSPITAL_COMMUNITY): Admission: RE | Admit: 2022-10-27 | Payer: BC Managed Care – PPO | Source: Ambulatory Visit

## 2022-11-08 ENCOUNTER — Inpatient Hospital Stay: Admission: RE | Admit: 2022-11-08 | Payer: BC Managed Care – PPO | Source: Ambulatory Visit

## 2022-11-08 ENCOUNTER — Other Ambulatory Visit: Payer: Self-pay | Admitting: Medical

## 2022-11-08 DIAGNOSIS — M8580 Other specified disorders of bone density and structure, unspecified site: Secondary | ICD-10-CM

## 2022-11-14 MED ORDER — TIZANIDINE HCL 4 MG PO TABS: 4.0000 mg | ORAL_TABLET | Freq: Every evening | ORAL | 3 refills | Status: DC | PRN

## 2022-11-14 MED ORDER — MELOXICAM 7.5 MG PO TABS: 7.5000 mg | ORAL_TABLET | Freq: Every day | ORAL | 3 refills | Status: DC | PRN

## 2022-11-14 NOTE — Telephone Encounter (Signed)
I called patient, she complains of constant neck pain, radiating pain to become right retro-orbital area headache, worried about the side effect of Effexor, Excedrin Migraine helps her, but have some GI side effect,  Previous allergic reaction to Celebrex, will try low-dose Mobic 7.5 mg as needed, may combine with tizanidine, encouraged her to continue physical therapy, warm compression, massage,  Keep follow-up appointment  Please see the MyChart message reply(ies) for my assessment and plan.    This patient gave consent for this Medical Advice Message and is aware that it may result in a bill to [employer], as well as the possibility of receiving a bill for a co-payment or deductible. They are an established patient, but are not seeking medical advice exclusively about a problem treated during an in person or video visit in the last seven days. I did not recommend an in person or video visit within seven days of my reply.    I spent a total of 10 minutes cumulative time within 7 days through [employer].  Levert Feinstein, MD

## 2022-11-16 ENCOUNTER — Other Ambulatory Visit (INDEPENDENT_AMBULATORY_CARE_PROVIDER_SITE_OTHER): Payer: BC Managed Care – PPO

## 2022-11-16 ENCOUNTER — Ambulatory Visit: Payer: BC Managed Care – PPO | Admitting: Orthopaedic Surgery

## 2022-11-16 DIAGNOSIS — M542 Cervicalgia: Secondary | ICD-10-CM

## 2022-11-16 DIAGNOSIS — M545 Low back pain, unspecified: Secondary | ICD-10-CM | POA: Diagnosis not present

## 2022-11-16 DIAGNOSIS — G8929 Other chronic pain: Secondary | ICD-10-CM

## 2022-11-16 MED ORDER — PREDNISONE 10 MG (21) PO TBPK: ORAL_TABLET | ORAL | 0 refills | Status: DC

## 2022-11-16 NOTE — Progress Notes (Signed)
Office Visit Note   Patient: Shari Lawrence           Date of Birth: 11-21-1960           MRN: [id number] Visit Date: 11/16/2022              Requested by: Jac Canavan, PA-C [address] [place],  [place] [address] PCP: Jac Canavan, PA-C   Assessment & Plan: Visit Diagnoses:  1. Chronic right-sided low back pain, unspecified whether sciatica present   2. Neck pain     Plan: Impression is chronic neck and back pain with right upper and right lower extremity radiculopathy in addition to new onset right groin pain questionably from the right hip joint given clinical exam findings.  I would like to start the patient on a steroid pack and muscle relaxer.  She has been instructed to not take anti-inflammatories while on the steroid pack.  She will continue with physical therapy at this point.  I provided her with a card with Dr. Kathi Der name for follow-up if her symptoms do not improve.  Call us with concerns or questions.  Follow-Up Instructions: Return if symptoms worsen or fail to improve.   Orders:  Orders Placed This Encounter  Procedures   XR Cervical Spine 2 or 3 views   XR Lumbar Spine 2-3 Views   Meds ordered this encounter  Medications   predniSONE (STERAPRED UNI-PAK 21 TAB) 10 MG (21) TBPK tablet    Sig: Take as directed.  Do not take with any other nsaids    Dispense:  21 tablet    Refill:  0      Procedures: No procedures performed   Clinical Data: No additional findings.   Subjective: Chief Complaint  Patient presents with   Lower Back - Pain    HPI patient is a pleasant 62 year old female who comes in today with right lateral neck and right parascapular pain in addition to right lower back and right lower leg pain.  In regards to the neck, the pain she has is primarily to the lateral neck and radiates into the parascapular region on the right.  Symptoms appear to be worse with neck flexion and extension.  She notes occasional zings  down both arms.  She has been taking Tylenol and Advil without significant relief.  Of note, she is status post cervical spine fusion C5-C7 several years ago.  In regards to the back, the pain is to the right lower back radiating down the right lower leg and into the groin.  She has associated burning sensations.  Pain appears to be worse when she is sleeping as well as doing yard work or using her right side of her body.  She denies any bowel or bladder change or saddle paresthesias.  She recently enrolled herself in physical therapy and has been to 1 visit.  Review of Systems as detailed in HPI.  All others reviewed and are negative.   Objective: Vital Signs: LMP 10/25/2012 (Approximate)   Physical Exam well-developed well-nourished female no acute distress.  Alert and oriented x 3.  Ortho Exam cervical spine exam: She has slight tenderness along the lower cervical spine.  Increased pain around the right parascapular region.  Increased pain with cervical spine flexion and extension as well as right-sided rotation.  Lumbar spine exam shows no spinous or paraspinous tenderness.  She does have pain with lumbar flexion and extension.  Negative straight leg raise.  No focal weakness.  She does have pain in the groin with logroll as well as Stinchfield test.  She is neurovascularly intact distally.  Specialty Comments:  No specialty comments available.  Imaging: XR Cervical Spine 2 or 3 views  Result Date: 11/16/2022 X-rays demonstrate previous fusion C5-7 with degenerative changes at C4-5  XR Lumbar Spine 2-3 Views  Result Date: 11/16/2022 X-rays demonstrate moderate scoliosis.  Otherwise mild spondylosis.    PMFS History: Patient Active Problem List   Diagnosis Date Noted   Chronic migraine w/o aura, not intractable, w/o stat migr 10/11/2022   Neck pain 10/11/2022   Subcutaneous nodule of head 09/28/2022   Imbalance 04/29/2022   Scoliosis 09/14/2021   Chronic right-sided thoracic back  pain 07/28/2020   Cervicalgia 07/28/2020   Prediabetes    Leg numbness 05/04/2020   Knee swelling 05/04/2020   Forehead pain 05/04/2020   History of recent stressful life event 11/20/2019   Panic attack 11/15/2019   Sleep disturbance 11/13/2019   Bilateral carpal tunnel syndrome 08/21/2019   Chronic pain of both knees 06/14/2019   Right arm pain 06/14/2019   Close exposure to COVID-19 virus 06/14/2019   Paresthesia 04/08/2019   Radicular pain in right arm 03/30/2019   Vaccine counseling 05/21/2018   Need for influenza vaccination 05/21/2018   Routine general medical examination at a health care facility 05/21/2018   Leg pain, diffuse, left 06/28/2017   Leg swelling 06/28/2017   Bruising 06/28/2017   Myofascial muscle pain 06/28/2017   High risk medication use 03/30/2017   Chronic constipation 11/17/2016   Globus sensation 11/17/2016   Primary osteoarthritis of both knees 10/05/2016   Autoimmune disease (HCC) 09/29/2016   Primary osteoarthritis of both hands 09/29/2016   Family history of lupus erythematosus 09/20/2016   Polyarthralgia 05/06/2016   Clenching of teeth 05/06/2016   Family history of rheumatoid arthritis 05/06/2016   Essential hypertension 12/23/2014   History of anemia 12/23/2014   Post-operative state 11/13/2012   Hyperlipidemia 11/11/2012   Impaired fasting glucose 10/25/2011   Gluten intolerance 10/25/2011   Gastroesophageal reflux disease without esophagitis 10/25/2011   Anxiety    Iron deficiency anemia    Past Medical History:  Diagnosis Date   Abnormal Pap smear 1988   treated with cryo   Anemia 11/2010   hematology consult prior; etiology malabsorption and uterine bleeding   Anxiety    COVID-19 10/28/2020   Gastric polyp    GERD (gastroesophageal reflux disease)    H/O bone density study 12/2007   H/O hysterectomy for benign disease 5/14   History of echocardiogram 03/2010   normal LV function, EF 60-65%, mild left atrial enlargement   HTN  (hypertension) 03/2010   hospitalization for HTN urgency   Hyperlipemia    Internal hemorrhoids    Lumbar degenerative disc disease    Migraine    Myalgia    Numbness and tingling    Prediabetes    Spondylosis, cervical    Umbilical hernia    Uterine fibroid    hx/o     Family History  Problem Relation Age of Onset   Hypertension Mother    Dementia Mother    Colon cancer Mother 83   Deep vein thrombosis Father    Hypertension Father    Cancer Father        prostate CA   Heart disease Father 10       CAD   Parkinsonism Father    Hypertension Sister    Hyperlipidemia Sister    Sjogren's  syndrome Sister    Hypertension Brother    Hyperlipidemia Brother    Hypertension Maternal Aunt    Hyperlipidemia Maternal Aunt    Rheum arthritis Maternal Aunt    Diabetes Maternal Aunt        1 with type II, 1 with type 1   Hypertension Brother    Rheum arthritis Brother    Diabetes Paternal Aunt        type II   Breast cancer Neg Hx     Past Surgical History:  Procedure Laterality Date   ABDOMINAL HYSTERECTOMY     CERVICAL FUSION  2010   CERVICAL SPINE SURGERY     CERVIX LESION DESTRUCTION  1988   CESAREAN SECTION     CHOLECYSTECTOMY  2003   COLONOSCOPY  08/2010   Dr. Loreta Ave   COLONOSCOPY  02/2017   Dr. Loreta Ave   ESOPHAGOGASTRODUODENOSCOPY  08/2010   Dr. Loreta Ave   EXPLORATORY LAPAROTOMY     HERNIA REPAIR     MYOMECTOMY  1998   PELVIC LAPAROSCOPY     ROBOTIC ASSISTED LAPAROSCOPIC HYSTERECTOMY AND SALPINGECTOMY  10/2012   [facility]; laproscopic due to fibroids   UMBILICAL HERNIA REPAIR  10/2013   infected laparoscopic port, mesh placement   Social History   Occupational History    Employer: [employer]    Comment: molecular Production manager, PhD candidate, [employer]  Tobacco Use   Smoking status: Never    Passive exposure: Never   Smokeless tobacco: Never  Vaping Use   Vaping Use: Never used  Substance and Sexual Activity   Alcohol use: No   Drug use: No   Sexual  activity: Not Currently    Partners: Male    Birth control/protection: Surgical    Comment: hysterectomy

## 2022-11-17 ENCOUNTER — Other Ambulatory Visit: Payer: BC Managed Care – PPO

## 2022-11-22 ENCOUNTER — Telehealth: Payer: Self-pay | Admitting: Internal Medicine

## 2022-11-22 ENCOUNTER — Telehealth: Payer: Self-pay | Admitting: Orthopaedic Surgery

## 2022-11-22 ENCOUNTER — Other Ambulatory Visit: Payer: BC Managed Care – PPO

## 2022-11-22 NOTE — Transitions of Care (Post Inpatient/ED Visit) (Signed)
11/22/2022  Name: Shari Lawrence MRN: [id number] DOB: 17-Mar-1961  Today's TOC FU Call Status: Today's TOC FU Call Status:: Successful TOC FU Call Competed TOC FU Call Complete Date: 11/22/22  Transition Care Management Follow-up Telephone Call Date of Discharge: 11/21/22 Discharge Facility: Other (Non[facility]) Name of Other ([facility]) Discharge Facility: [facility] Type of Discharge: Emergency Department Reason for ED Visit: Other: (HEADACHE) How have you been since you were released from the hospital?: Better Any questions or concerns?: No  Items Reviewed: Did you receive and understand the discharge instructions provided?: Yes Medications obtained,verified, and reconciled?: Yes (Medications Reviewed) Any new allergies since your discharge?: No Dietary orders reviewed?: No Do you have support at home?: Yes  Medications Reviewed Today: Medications Reviewed Today     Reviewed by Britt Boozer, CMA (Certified Medical Assistant) on 10/17/22 at 1225  Med List Status: <None>   Medication Order Taking? Sig Documenting Provider Last Dose Status Informant  acetaminophen (TYLENOL) 500 MG tablet [id number] Yes Take 1 tablet (500 mg total) by mouth every 6 (six) hours as needed. Everlene Farrier, PA-C Taking Active   ALPRAZolam Prudy Feeler) 0.5 MG tablet [id number] Yes Take 0.5 mg by mouth as needed for anxiety. [provider] Taking Active   amLODipine (NORVASC) 5 MG tablet [id number] Yes Take 1 tablet (5 mg total) by mouth daily. Tysinger, Kermit Balo, PA-C Taking Active   Ascorbic Acid (VITAMIN C) 1000 MG tablet [id number] Yes Take 1,000 mg by mouth daily. [provider] Taking Active Self  atorvastatin (LIPITOR) 20 MG tablet [id number] Yes TAKE 1 TABLET BY MOUTH EVERY DAY Tysinger, Kermit Balo, PA-C Taking Active   B Complex Vitamins (B COMPLEX PO) [id number] Yes Take 1 tablet by mouth daily. [provider] Taking Active Self  calcium carbonate  (OS-CAL) 600 MG TABS [id number] Yes Take 600 mg by mouth daily. [provider] Taking Active Self  CINNAMON PO [id number] Yes Take 1 tablet by mouth daily.  [provider] Taking Active Self  esomeprazole (NEXIUM) 20 MG capsule [id number] Yes Take 1 capsule (20 mg total) by mouth daily at 12 noon. Tysinger, Kermit Balo, PA-C Taking Active   estradiol (VIVELLE-DOT) 0.025 MG/24HR [id number] Yes PLACE 1 PATCH ONTO THE SKIN 2 TIMES A WEEK. Romualdo Bolk, MD Taking Active   Estradiol Anson Fret) 10 MCG TABS vaginal tablet [id number] Yes PLACE ONE TABLET VAGINALLY TWICE WEEKLY AT BEDTIME Romualdo Bolk, MD Taking Active   fish oil-omega-3 fatty acids 1000 MG capsule [id number] Yes Take 1 g by mouth daily. [provider] Taking Active Self  hydroxychloroquine (PLAQUENIL) 200 MG tablet [id number] Yes TAKE 1 TABLET BY MOUTH TWICE A DAY ON MONDAYS THROUGH FRIDAYS ONLY Deveshwar, Janalyn Rouse, MD Taking Active   Multiple Vitamin (MULTIVITAMIN) tablet [id number] Yes Take 1 tablet by mouth daily. [provider] Taking Active Self  pantoprazole (PROTONIX) 40 MG tablet [id number] Yes Take 1 tablet (40 mg total) by mouth daily. Tysinger, Kermit Balo, PA-C Taking Active   Probiotic Product (PROBIOTIC ADVANCED PO) [id number] Yes Take 1 tablet by mouth daily.  [provider] Taking Active Self  rizatriptan (MAXALT-MLT) 5 MG disintegrating tablet [id number] Yes Take 1 tablet (5 mg total) by mouth as needed for migraine. May repeat in 2 hours if needed Levert Feinstein, MD Taking Active   TURMERIC PO [id number] Yes Take 1 tablet by mouth daily.  [provider] Taking Active Self  venlafaxine XR (EFFEXOR XR) 37.5 MG 24 hr capsule [id number] Yes Take 1  capsule (37.5 mg total) by mouth daily with breakfast. Levert Feinstein, MD Taking Active             Home Care and Equipment/Supplies: Were Home Health Services Ordered?: No Any new equipment or medical supplies ordered?: No  Functional  Questionnaire: Do you need assistance with bathing/showering or dressing?: No Do you need assistance with meal preparation?: No Do you need assistance with eating?: No Do you have difficulty maintaining continence: No Do you need assistance with getting out of bed/getting out of a chair/moving?: No Do you have difficulty managing or taking your medications?: No  Follow up appointments reviewed: PCP Follow-up appointment confirmed?: Yes Date of PCP follow-up appointment?: 11/23/22 Specialist Hospital Follow-up appointment confirmed?: No Do you need transportation to your follow-up appointment?: No    SIGNATURE: [employer]

## 2022-11-22 NOTE — Telephone Encounter (Signed)
Pt called requesting a call back. Pt states she is having some issues/side affects with the prednisone. Pt states she stop taking them but still feels pressure in her head and havie in her legs. Please call pt about this matter at [phone]

## 2022-11-22 NOTE — Telephone Encounter (Signed)
Spoke with patient. She developed high blood pressure over the weekend, after being on a steroid dose pak. She states that she called in and was told to stop the prednisone. She stopped it Sunday. She is experiencing pressure in her head and wants to know if this will go away, or if its related to the prednisone. Dr.Xu advised that she contact her PCP, he does not feel like it's coming from the prednisone.

## 2022-11-23 ENCOUNTER — Ambulatory Visit: Payer: BC Managed Care – PPO | Admitting: Medical

## 2022-11-23 VITALS — BP 120/80 | HR 70 | Temp 97.9°F | Wt 127.4 lb

## 2022-11-23 DIAGNOSIS — F419 Anxiety disorder, unspecified: Secondary | ICD-10-CM

## 2022-11-23 DIAGNOSIS — M542 Cervicalgia: Secondary | ICD-10-CM | POA: Diagnosis not present

## 2022-11-23 DIAGNOSIS — M359 Systemic involvement of connective tissue, unspecified: Secondary | ICD-10-CM

## 2022-11-23 DIAGNOSIS — M7918 Myalgia, other site: Secondary | ICD-10-CM

## 2022-11-23 DIAGNOSIS — R202 Paresthesia of skin: Secondary | ICD-10-CM

## 2022-11-23 DIAGNOSIS — Z79899 Other long term (current) drug therapy: Secondary | ICD-10-CM

## 2022-11-23 DIAGNOSIS — G43709 Chronic migraine without aura, not intractable, without status migrainosus: Secondary | ICD-10-CM

## 2022-11-23 NOTE — Progress Notes (Signed)
Subjective:  Shari Lawrence is a 62 y.o. female who presents for Chief Complaint  Patient presents with   Hospitalization Follow-up    Having pain all over. Was on prednisone. Had a lot of pressure in face and neck.      Medical team:  Patient Care Team: Suzzette Gasparro, Kermit Balo, PA-C as PCP - General Charna Elizabeth, MD as Attending Physician (Gastroenterology) [name], Edwena Felty, MD as Attending Physician (Obstetrics and Gynecology) Pollyann Savoy, MD as Consulting Physician (Rheumatology) Romualdo Bolk, MD as Consulting Physician (Obstetrics and Gynecology) Mamie Nick, DC as Referring Physician (Chiropractic Medicine) Dr. Levert Feinstein, neurology Dr. Gershon Mussel, ortho Sees dentist Sees eye doctor   Here for ED f/u.  Went to emergency dept 11/20/22 for very high BP, but was on prednisone recently by ortho for pains in neck, shoulders, pressure in neck.  Has been feeling odd in head, worse with quick standing or worse singing.  Was put on prednisone for inflammation.  Had xrays recent with ortho.  Has been doing physical therapy. For at least a month.  Before she saw her neurologist Dr. Terrace Arabia recently in April, had onging headache for 1.5 months  Sometimes arms feel heavy, sometimes feels loss of sensation in neck and chest.  Gets ongoing periodic strain over right eye.   Still having shoulder pains.  In recent days having extreme pain in right shoulder and right flank  Getting physical therapy through [facility], referred by her rheumatologist  She doesn't like prednisone as it makes her feel out of her herself, unusual.  Wonders if the worse pains are from fibromyalgia.  She has hx/o of this.  She denies recent fever, no recent urinary or gastric symptoms  She wonders if its time to update brain or C spine MRI.  No other aggravating or relieving factors.    No other c/o.  Past Medical History:  Diagnosis Date   Abnormal Pap smear 1988   treated with cryo    Anemia 11/2010   hematology consult prior; etiology malabsorption and uterine bleeding   Anxiety    COVID-19 10/28/2020   Gastric polyp    GERD (gastroesophageal reflux disease)    H/O bone density study 12/2007   H/O hysterectomy for benign disease 5/14   History of echocardiogram 03/2010   normal LV function, EF 60-65%, mild left atrial enlargement   HTN (hypertension) 03/2010   hospitalization for HTN urgency   Hyperlipemia    Internal hemorrhoids    Lumbar degenerative disc disease    Migraine    Myalgia    Numbness and tingling    Prediabetes    Spondylosis, cervical    Umbilical hernia    Uterine fibroid    hx/o    Current Outpatient Medications on File Prior to Visit  Medication Sig Dispense Refill   acetaminophen (TYLENOL) 500 MG tablet Take 1 tablet (500 mg total) by mouth every 6 (six) hours as needed. 30 tablet 0   ALPRAZolam (XANAX) 0.5 MG tablet Take 0.5 mg by mouth as needed for anxiety.     amLODipine (NORVASC) 5 MG tablet Take 1 tablet (5 mg total) by mouth daily. 90 tablet 3   Ascorbic Acid (VITAMIN C) 1000 MG tablet Take 1,000 mg by mouth daily.     atorvastatin (LIPITOR) 20 MG tablet TAKE 1 TABLET BY MOUTH EVERY DAY 30 tablet 5   B Complex Vitamins (B COMPLEX PO) Take 1 tablet by mouth daily.     calcium  carbonate (OS-CAL) 600 MG TABS Take 600 mg by mouth daily.     CINNAMON PO Take 1 tablet by mouth daily.      diclofenac Sodium (VOLTAREN) 1 % GEL Apply 2 g topically 4 (four) times daily.     esomeprazole (NEXIUM) 20 MG capsule Take 1 capsule (20 mg total) by mouth daily at 12 noon. 1 capsule 0   estradiol (VIVELLE-DOT) 0.025 MG/24HR PLACE 1 PATCH ONTO THE SKIN 2 TIMES A WEEK. 24 patch 3   Estradiol (YUVAFEM) 10 MCG TABS vaginal tablet PLACE ONE TABLET VAGINALLY TWICE WEEKLY AT BEDTIME 24 tablet 3   fish oil-omega-3 fatty acids 1000 MG capsule Take 1 g by mouth daily.     fluticasone (FLONASE) 50 MCG/ACT nasal spray Place 2 sprays into both nostrils daily.  16 g 2   hydroxychloroquine (PLAQUENIL) 200 MG tablet TAKE 1 TABLET BY MOUTH TWICE A DAY ON MONDAYS THROUGH FRIDAYS ONLY 120 tablet 0   meloxicam (MOBIC) 7.5 MG tablet Take 1 tablet (7.5 mg total) by mouth daily as needed for pain. 30 tablet 3   Multiple Vitamin (MULTIVITAMIN) tablet Take 1 tablet by mouth daily.     pantoprazole (PROTONIX) 40 MG tablet Take 1 tablet (40 mg total) by mouth daily. 90 tablet 0   Probiotic Product (PROBIOTIC ADVANCED PO) Take 1 tablet by mouth daily.      rizatriptan (MAXALT-MLT) 5 MG disintegrating tablet Take 1 tablet (5 mg total) by mouth as needed for migraine. May repeat in 2 hours if needed 10 tablet 11   tiZANidine (ZANAFLEX) 4 MG tablet Take 1 tablet (4 mg total) by mouth at bedtime as needed for muscle spasms. 30 tablet 3   TURMERIC PO Take 1 tablet by mouth daily.      venlafaxine XR (EFFEXOR XR) 37.5 MG 24 hr capsule Take 1 capsule (37.5 mg total) by mouth daily with breakfast. 30 capsule 11   No current facility-administered medications on file prior to visit.    The following portions of the patient's history were reviewed and updated as appropriate: allergies, current medications, past family history, past medical history, past social history, past surgical history and problem list.  ROS Otherwise as in subjective above    Objective: BP 120/80   Pulse 70   Temp 97.9 F (36.6 C)   Wt 127 lb 6.4 oz (57.8 kg)   LMP 10/25/2012 (Approximate)   SpO2 99%   BMI 21.87 kg/m   General appearance: alert, no distress, well developed, well nourished Neck: supple, nontender to palpation, no lymphadenopathy, no thyromegaly, no masses Back: mild tenderness in rhomboids and paraspinal region bilat upper back, otherwise nontender Heart: RRR, normal S1, S2, no murmurs Lungs: CTA bilaterally, no wheezes, rhonchi, or rales arms nontender in general with seemingly normal ROM without much pain today Pulses: 2+ radial pulses, 2+ pedal pulses, normal cap  refill Ext: no edema Arms neurovascularly intact Nonfocal neuro exam otherwise today    Assessment: Encounter Diagnoses  Name Primary?   Autoimmune disease (HCC) Yes   Anxiety    Cervicalgia    Chronic migraine w/o aura, not intractable, w/o stat migr    Myofascial muscle pain    High risk medication use    Paresthesia      Plan: We discussed her concerns, symptoms  She has recently seen ortho, prescribed steroid for inflammation and doing PT.  They have follow up planned if she is not improving  She saw neurology in April for headache and  pain, and at that time the cervical spine issues were thought to be triggering headaches  We discussed her underlying autoimmune disease, fibromyalgia type pain, abnormal findings on imaging of neck prior.  I suspect her symptoms lately are more due to the cervical spine changes.  Continue PT, massage therapy, continue her current medications.  I will consult with her neurologist and ortho about her symptoms and plan to get her back in for follow up with them.  Jamirrah was seen today for hospitalization follow-up.  Diagnoses and all orders for this visit:  Autoimmune disease (HCC)  Anxiety  Cervicalgia  Chronic migraine w/o aura, not intractable, w/o stat migr  Myofascial muscle pain  High risk medication use  Paresthesia    Follow up: with ortho

## 2022-11-24 ENCOUNTER — Telehealth: Payer: Self-pay | Admitting: Neurology

## 2022-11-24 DIAGNOSIS — M542 Cervicalgia: Secondary | ICD-10-CM

## 2022-11-24 DIAGNOSIS — Z981 Arthrodesis status: Secondary | ICD-10-CM

## 2022-11-24 DIAGNOSIS — G8929 Other chronic pain: Secondary | ICD-10-CM

## 2022-11-24 NOTE — Addendum Note (Signed)
Addended by: Huston Foley on: 11/24/2022 04:54 PM   Modules accepted: Orders

## 2022-11-24 NOTE — Telephone Encounter (Signed)
Call to patient, she states she saw her PCP yesterday and he has prescribed Meloxicam and she will be picking it up from pharmacy shortly. She states that the pain is in her right side/arm/shoulder area and her arms feel "weighted" on both sides but more so on the left side. She gets shooting pains in the left arm with movements and can't get comfortable. She was prescribed prednisone last week but unable to finished course due to medication side effects N/V. She would like to see about getting MRI C Spine scheduled and not waiting a few weeks for Dr. Terrace Arabia to return. Advised I would wait list her appointment, send Dr. Aleen Campi message and our conversation to Dr. Terrace Arabia covering provider. Reminder patient our office os closed on Friday and if she needs urgent care to follow up with PCP, urgent care or ER.Patient verbalized understanding and stated she may send a MyChart message to let us know how meloxicam is working for her. Patient appreciate of call.

## 2022-11-24 NOTE — Telephone Encounter (Signed)
We can look at the neck again with a cervical spine MRI without contrast.  Order placed on behalf of Dr. Terrace Arabia.

## 2022-11-24 NOTE — Telephone Encounter (Signed)
Call to patient to inform C spine MRI was ordered by Dr. Frances Furbish. Patient appreciative of call and quick follow up.

## 2022-11-24 NOTE — Telephone Encounter (Signed)
Pt does not feel she is improving, she does not feel she can wait until August as far as hearing something about helping her.

## 2022-11-30 ENCOUNTER — Telehealth: Payer: Self-pay | Admitting: Neurology

## 2022-11-30 MED ORDER — ALPRAZOLAM 0.5 MG PO TABS: ORAL_TABLET | ORAL | 0 refills | Status: DC

## 2022-11-30 NOTE — Addendum Note (Signed)
Addended by: Joycelyn Schmid R on: 11/30/2022 10:52 AM   Modules accepted: Orders

## 2022-11-30 NOTE — Telephone Encounter (Signed)
Patient is scheduled for MRI at [facility] on June 12 and is requesting xanax to take before. thanks

## 2022-11-30 NOTE — Telephone Encounter (Signed)
Meds ordered this encounter  Medications   ALPRAZolam (XANAX) 0.5 MG tablet    Sig: for sedation before MRI scan; take 1 tab 1 hour before scan; may repeat 1 tab 15 min before scan    Dispense:  3 tablet    Refill:  0    Suanne Marker, MD 11/30/2022, 10:52 AM Certified in Neurology, Neurophysiology and Neuroimaging  [facility] [address] [place], [place] [address] [phone]

## 2022-12-03 ENCOUNTER — Other Ambulatory Visit (INDEPENDENT_AMBULATORY_CARE_PROVIDER_SITE_OTHER): Payer: BC Managed Care – PPO | Admitting: Family Medicine

## 2022-12-03 ENCOUNTER — Telehealth: Payer: Self-pay | Admitting: Family Medicine

## 2022-12-03 DIAGNOSIS — U071 COVID-19: Secondary | ICD-10-CM | POA: Diagnosis not present

## 2022-12-03 MED ORDER — NIRMATRELVIR/RITONAVIR (PAXLOVID)TABLET: 3.0000 | ORAL_TABLET | Freq: Two times a day (BID) | ORAL | 0 refills | Status: AC

## 2022-12-03 NOTE — Progress Notes (Signed)
   Subjective:    Patient ID: Shari Lawrence, female    DOB: [birth year], 61 y.o.   MRN: [id number]  HPI She started having upper respiratory symptoms yesterday and tested this morning for COVID and is positive.   Review of Systems     Objective:   Physical Exam        Assessment & Plan:  COVID-19 - Plan: nirmatrelvir/ritonavir (PAXLOVID) 20 x 150 MG & 10 x 100MG  TABS Recommend continued conservative care and call if further difficulties.

## 2022-12-07 ENCOUNTER — Ambulatory Visit: Payer: BC Managed Care – PPO

## 2022-12-07 DIAGNOSIS — M542 Cervicalgia: Secondary | ICD-10-CM

## 2022-12-07 DIAGNOSIS — Z981 Arthrodesis status: Secondary | ICD-10-CM

## 2022-12-07 DIAGNOSIS — G8929 Other chronic pain: Secondary | ICD-10-CM | POA: Diagnosis not present

## 2022-12-08 ENCOUNTER — Telehealth: Payer: Self-pay

## 2022-12-08 NOTE — Telephone Encounter (Signed)
-----   Message from Huston Foley, MD sent at 12/08/2022 10:14 AM EDT ----- MRI neck shows post op changes at level C5-7, and degenerative changes at C4-5. I she has ongoing neck pain, I recommend a FU with her spine surgeon.

## 2022-12-08 NOTE — Telephone Encounter (Signed)
Called patient who stated she would reach back out the surgeon. Pt had no questions at this time but was encouraged to call back if questions arise.

## 2022-12-09 ENCOUNTER — Other Ambulatory Visit: Payer: Self-pay | Admitting: Medical

## 2022-12-09 ENCOUNTER — Telehealth: Payer: Self-pay | Admitting: Medical

## 2022-12-09 DIAGNOSIS — M792 Neuralgia and neuritis, unspecified: Secondary | ICD-10-CM

## 2022-12-09 DIAGNOSIS — R937 Abnormal findings on diagnostic imaging of other parts of musculoskeletal system: Secondary | ICD-10-CM

## 2022-12-09 DIAGNOSIS — M542 Cervicalgia: Secondary | ICD-10-CM

## 2022-12-09 NOTE — Telephone Encounter (Signed)
Pt called and states that she was advised that she does need a surgery referral. She would prefer to see [facility] who did her previous surgery. Pt is requesting a referral to [facility], PT can be reached at [phone].

## 2022-12-09 NOTE — Telephone Encounter (Signed)
Left detailed message that referral has been placed. 

## 2022-12-16 ENCOUNTER — Encounter: Payer: Self-pay | Admitting: Neurology

## 2022-12-21 ENCOUNTER — Other Ambulatory Visit: Payer: Self-pay | Admitting: Medical

## 2022-12-21 MED ORDER — NEOMYCIN-POLYMYXIN-HC 3.5-10000-1 OT SUSP: 3.0000 [drp] | Freq: Four times a day (QID) | OTIC | 0 refills | Status: DC

## 2022-12-24 ENCOUNTER — Emergency Department (HOSPITAL_COMMUNITY)
Admission: EM | Admit: 2022-12-24 | Discharge: 2022-12-25 | Disposition: A | Discharge status: Home / Self Care | Payer: BC Managed Care – PPO | Attending: Emergency Medicine | Admitting: Emergency Medicine

## 2022-12-24 ENCOUNTER — Other Ambulatory Visit: Payer: Self-pay

## 2022-12-24 ENCOUNTER — Encounter (HOSPITAL_COMMUNITY): Payer: Self-pay

## 2022-12-24 ENCOUNTER — Emergency Department (HOSPITAL_COMMUNITY): Payer: BC Managed Care – PPO

## 2022-12-24 DIAGNOSIS — Z8616 Personal history of COVID-19: Secondary | ICD-10-CM | POA: Diagnosis not present

## 2022-12-24 DIAGNOSIS — R110 Nausea: Secondary | ICD-10-CM | POA: Diagnosis not present

## 2022-12-24 DIAGNOSIS — R519 Headache, unspecified: Secondary | ICD-10-CM | POA: Diagnosis present

## 2022-12-24 DIAGNOSIS — G8929 Other chronic pain: Secondary | ICD-10-CM | POA: Diagnosis not present

## 2022-12-24 DIAGNOSIS — R1032 Left lower quadrant pain: Secondary | ICD-10-CM | POA: Diagnosis not present

## 2022-12-24 DIAGNOSIS — I10 Essential (primary) hypertension: Secondary | ICD-10-CM | POA: Insufficient documentation

## 2022-12-24 LAB — CBC WITH DIFFERENTIAL/PLATELET
Abs Immature Granulocytes: 0.01 10*3/uL (ref 0.00–0.07)
Basophils Absolute: 0 10*3/uL (ref 0.0–0.1)
Basophils Relative: 1 %
Eosinophils Absolute: 0.1 10*3/uL (ref 0.0–0.5)
Eosinophils Relative: 2 %
HCT: 35.3 % — ABNORMAL LOW (ref 36.0–46.0)
Hemoglobin: 11.3 g/dL — ABNORMAL LOW (ref 12.0–15.0)
Immature Granulocytes: 0 %
Lymphocytes Relative: 42 %
Lymphs Abs: 2 10*3/uL (ref 0.7–4.0)
MCH: 29.5 pg (ref 26.0–34.0)
MCHC: 32 g/dL (ref 30.0–36.0)
MCV: 92.2 fL (ref 80.0–100.0)
Monocytes Absolute: 0.4 10*3/uL (ref 0.1–1.0)
Monocytes Relative: 8 %
Neutro Abs: 2.3 10*3/uL (ref 1.7–7.7)
Neutrophils Relative %: 47 %
Platelets: 183 10*3/uL (ref 150–400)
RBC: 3.83 MIL/uL — ABNORMAL LOW (ref 3.87–5.11)
RDW: 14.3 % (ref 11.5–15.5)
WBC: 4.8 10*3/uL (ref 4.0–10.5)
nRBC: 0 % (ref 0.0–0.2)

## 2022-12-24 LAB — BASIC METABOLIC PANEL
Anion gap: 15 (ref 5–15)
BUN: 14 mg/dL (ref 8–23)
CO2: 25 mmol/L (ref 22–32)
Calcium: 9.9 mg/dL (ref 8.9–10.3)
Chloride: 103 mmol/L (ref 98–111)
Creatinine, Ser: 1.01 mg/dL — ABNORMAL HIGH (ref 0.44–1.00)
GFR, Estimated: >60 mL/min (ref >60)
Glucose, Bld: 120 mg/dL — ABNORMAL HIGH (ref 70–99)
Potassium: 3.7 mmol/L (ref 3.5–5.1)
Sodium: 143 mmol/L (ref 135–145)

## 2022-12-24 MED ORDER — IOHEXOL 350 MG/ML SOLN
75.0000 mL | Freq: Once | INTRAVENOUS | Status: AC | PRN
  Administered 2022-12-24: 75 mL via INTRAVENOUS

## 2022-12-24 MED ORDER — PROCHLORPERAZINE EDISYLATE 10 MG/2ML IJ SOLN
10.0000 mg | Freq: Once | INTRAMUSCULAR | Status: AC
  Administered 2022-12-24: 10 mg via INTRAVENOUS
  Filled 2022-12-24: qty 2

## 2022-12-24 MED ORDER — LACTATED RINGERS IV BOLUS
1000.0000 mL | Freq: Once | INTRAVENOUS | Status: AC
  Administered 2022-12-24: 1000 mL via INTRAVENOUS

## 2022-12-24 MED ORDER — KETOROLAC TROMETHAMINE 15 MG/ML IJ SOLN
15.0000 mg | Freq: Once | INTRAMUSCULAR | Status: AC
  Administered 2022-12-24: 15 mg via INTRAVENOUS
  Filled 2022-12-24: qty 1

## 2022-12-24 MED ORDER — DIPHENHYDRAMINE HCL 50 MG/ML IJ SOLN
25.0000 mg | Freq: Once | INTRAMUSCULAR | Status: AC
  Administered 2022-12-24: 25 mg via INTRAVENOUS
  Filled 2022-12-24: qty 1

## 2022-12-24 MED ORDER — MAGNESIUM SULFATE 2 GM/50ML IV SOLN
2.0000 g | Freq: Once | INTRAVENOUS | Status: AC
  Administered 2022-12-24: 2 g via INTRAVENOUS
  Filled 2022-12-24: qty 50

## 2022-12-24 NOTE — ED Triage Notes (Addendum)
Patient arrives POV c/o a headache on the right side of her head and face "for some time now." Patient reports shooting pain on the top right side of her head that worsens with movement and radiates down the back of her head, including behind right ear. Patient states she a sinus infection and bilateral ear infection 2 months ago; patient states she completed the antibiotic and symptoms improved, "but headache continued after that." Patient also states she had COVID last month and took Paxlovid.

## 2022-12-24 NOTE — Discharge Instructions (Signed)
Shari Lawrence:  Thank you for allowing [place] to take care of you today.  We hope you begin feeling better soon.  To-Do: Please follow-up with your primary doctor. Please return to the Emergency Department or call 911 if you experience chest pain, shortness of breath, severe pain, severe fever, altered mental status, or have any reason to think that you need emergency medical care.  Thank you again.  Hope you feel better soon.  Department of Emergency Medicine [facility]

## 2022-12-24 NOTE — ED Provider Notes (Signed)
[place] EMERGENCY DEPARTMENT AT [facility] Provider Note   HPI: Shari Lawrence is a 62 year old female with a past medical history as below presenting today with headaches.  She reports she has had these over the last 2 months.  She endorses associated nausea without vomiting and photophobia.  She has not had fevers, chills, neck stiffness.  She endorses full range of motion of her neck.  She denies any visual changes or temporal pain.  She reports at times the pain radiates towards the top of her head.  She denies any recent trauma.  She reports a history of COVID preceding this and since then she has had daily headaches that fluctuate in intensity but are constantly there.  She denies weakness or numbness in the upper or lower extremities.  She denies difficulty ambulating.  Due to persistence of her symptoms she presented for evaluation.  She endorses a strong family history of migraines.  Past Medical History:  Diagnosis Date   Abnormal Pap smear 1988   treated with cryo   Anemia 11/2010   hematology consult prior; etiology malabsorption and uterine bleeding   Anxiety    COVID-19 10/28/2020   Gastric polyp    GERD (gastroesophageal reflux disease)    H/O bone density study 12/2007   H/O hysterectomy for benign disease 5/14   History of echocardiogram 03/2010   normal LV function, EF 60-65%, mild left atrial enlargement   HTN (hypertension) 03/2010   hospitalization for HTN urgency   Hyperlipemia    Internal hemorrhoids    Lumbar degenerative disc disease    Migraine    Myalgia    Numbness and tingling    Prediabetes    Spondylosis, cervical    Umbilical hernia    Uterine fibroid    hx/o     Past Surgical History:  Procedure Laterality Date   ABDOMINAL HYSTERECTOMY     CERVICAL FUSION  2010   CERVICAL SPINE SURGERY     CERVIX LESION DESTRUCTION  1988   CESAREAN SECTION     CHOLECYSTECTOMY  2003   COLONOSCOPY  08/2010   Dr. Loreta Ave   COLONOSCOPY   02/2017   Dr. Loreta Ave   ESOPHAGOGASTRODUODENOSCOPY  08/2010   Dr. Loreta Ave   EXPLORATORY LAPAROTOMY     HERNIA REPAIR     MYOMECTOMY  1998   PELVIC LAPAROSCOPY     ROBOTIC ASSISTED LAPAROSCOPIC HYSTERECTOMY AND SALPINGECTOMY  10/2012   [facility]; laproscopic due to fibroids   UMBILICAL HERNIA REPAIR  10/2013   infected laparoscopic port, mesh placement     Social History   Tobacco Use   Smoking status: Never    Passive exposure: Never   Smokeless tobacco: Never  Vaping Use   Vaping Use: Never used  Substance Use Topics   Alcohol use: No   Drug use: No      Review of Systems  A complete ROS was performed with pertinent positives/negatives noted in the HPI.   Vitals:   12/24/22 1916  BP: (!) 159/92  Pulse: 76  Resp: 16  Temp: 98.1 F (36.7 C)  SpO2: 100%    Physical Exam Vitals and nursing note reviewed.  Constitutional:      General: She is not in acute distress.    Appearance: She is well-developed. She is not ill-appearing.  Cardiovascular:     Rate and Rhythm: Normal rate and regular rhythm.     Pulses: Normal pulses.     Heart sounds: Normal heart  sounds. No murmur heard.    No friction rub. No gallop.  Pulmonary:     Effort: Pulmonary effort is normal. No respiratory distress.     Breath sounds: Normal breath sounds. No stridor. No wheezing, rhonchi or rales.  Abdominal:     General: Abdomen is flat. There is no distension.     Palpations: Abdomen is soft.     Tenderness: There is no abdominal tenderness. There is no guarding or rebound.  Musculoskeletal:        General: No swelling.  Skin:    General: Skin is warm and dry.     Capillary Refill: Capillary refill takes less than 2 seconds.  Neurological:     Mental Status: She is alert.     Sensory: Sensation is intact. No sensory deficit.     Motor: Motor function is intact. No weakness, tremor, atrophy, abnormal muscle tone or seizure activity.     Coordination: Coordination is intact. Coordination normal.      Comments: CRANIAL NERVES: 2 (Optic) - Visual fields intact. PERRL brisk.  3/4/6 (Oculomotor, Trochlear, Abducens) - EOM full without nystagmus 5 (Trigeminal) - sensation intact to light touch 7 (Facial) - No facial weakness or asymmetry.  8 (Vestibulococchlear) - responds to voice, auditory acuity grossly normal 9/10 (Glossopharyngeal) - symmetric palate and uvula elevation 11 (Spinal accessory) - head midline, Normal and symmetric sternocleidomastoid and trapezius strength  12 (Hypoglossal) - tongue midline      Procedures  MDM:  Lab results:  Results for orders placed or performed during the hospital encounter of 12/24/22 (from the past 24 hour(s))  CBC with Differential     Status: Abnormal   Collection Time: 12/24/22  7:44 PM  Result Value Ref Range   WBC 4.8 4.0 - 10.5 K/uL   RBC 3.83 (L) 3.87 - 5.11 MIL/uL   Hemoglobin 11.3 (L) 12.0 - 15.0 g/dL   HCT 41.3 (L) 24.4 - 01.0 %   MCV 92.2 80.0 - 100.0 fL   MCH 29.5 26.0 - 34.0 pg   MCHC 32.0 30.0 - 36.0 g/dL   RDW 27.2 53.6 - 64.4 %   Platelets 183 150 - 400 K/uL   nRBC 0.0 0.0 - 0.2 %   Neutrophils Relative % 47 %   Neutro Abs 2.3 1.7 - 7.7 K/uL   Lymphocytes Relative 42 %   Lymphs Abs 2.0 0.7 - 4.0 K/uL   Monocytes Relative 8 %   Monocytes Absolute 0.4 0.1 - 1.0 K/uL   Eosinophils Relative 2 %   Eosinophils Absolute 0.1 0.0 - 0.5 K/uL   Basophils Relative 1 %   Basophils Absolute 0.0 0.0 - 0.1 K/uL   Immature Granulocytes 0 %   Abs Immature Granulocytes 0.01 0.00 - 0.07 K/uL    Key medications administered in the ER:  Medications  magnesium sulfate IVPB 2 g 50 mL (has no administration in time range)  ketorolac (TORADOL) 15 MG/ML injection 15 mg (15 mg Intravenous Given 12/24/22 2215)  prochlorperazine (COMPAZINE) injection 10 mg (10 mg Intravenous Given 12/24/22 2216)  diphenhydrAMINE (BENADRYL) injection 25 mg (25 mg Intravenous Given 12/24/22 2213)  lactated ringers bolus 1,000 mL (1,000 mLs Intravenous New  Bag/Given 12/24/22 2210)    Medical decision making: -Vital signs stable. Patient afebrile, hemodynamically stable, and non-toxic appearing. -Patient's presentation is most consistent with acute complicated illness / injury requiring diagnostic workup.. Shari Lawrence is a 62 y.o. female presenting to the emergency department with a headache.  -HA onset  was slow, not quick or thunderclap, doubt ICH.  Pt has no focal neuro sx, neuro exam is wnl, no visual disturbance, no dizziness/lightheadedness and HA is described as typical HA, doubt intracranial abnormality such as aneurysm, mass or intracranial bleed. Do not suspect vertebral artery or carotid artery dissection.  No infectious sx, no meningismus, afebrile, no ams, doubt meningitis.  No sinus ttp, doubt sinusitis.  There is nothing on hx or exam to give me c/f dental or ear etiology.  No tenderness over temporal artery, doubt temporal arteritis. No tearing or eye pain, doubt cluster HA.  Nothing in hx to concern me for CO poisoning. No hyperesthesia or rash to concern me for zoster.   -Pt describes HA as their typical HA and most likely migraine HA. Patient's headache was managed with fluids, Benadryl, Toradol, mag, Compazine. Upon reassessment, patient's headache improved after treatment.  Getting CT scan to rule out mass or venous sinus thrombosis.  CT is pending.  Plan at signout to follow-up on CT and reassess. Pt care was handed off to oncoming team at 2330.  Complete history and physical and current plan have been communicated.  Please refer to their note for the remainder of ED care and ultimate disposition.    Medical Decision Making Amount and/or Complexity of Data Reviewed Labs: ordered. Decision-making details documented in ED Course. Radiology: ordered.  Risk Prescription drug management.     The plan for this patient was discussed with Dr. Rush Landmark, who voiced agreement and who oversaw evaluation and treatment of this  patient.  Marta Lamas, MD Emergency Medicine, PGY-3  Note: Dragon medical dictation software was used in the creation of this note.   Clinical Impression:  1. Chronic nonintractable headache, unspecified headache type   2. LLQ pain          Chase Caller, MD 12/24/22 2346    Tegeler, Canary Brim, MD 12/25/22 1520

## 2022-12-25 NOTE — ED Provider Notes (Signed)
I assumed care of this patient from previous provider.  Please see their note for further details of history, exam, and MDM.   Briefly patient is a 62 y.o. female who presented with atypical migraine headache.  Ruling out VST pending CT venogram.  Treated with migraine cocktail.  12:54 AM CT negative.  On reassessment, patient reports headache has significantly subsided.  The patient appears reasonably screened and/or stabilized for discharge and I doubt any other medical condition or other [facility] requiring further screening, evaluation, or treatment in the ED at this time. I have discussed the findings, Dx and Tx plan with the patient/family who expressed understanding and agree(s) with the plan. Discharge instructions discussed at length. The patient/family was given strict return precautions who verbalized understanding of the instructions. No further questions at time of discharge.  Disposition: Discharge  Condition: Good  ED Discharge Orders     None       Follow Up: Jac Canavan, PA-C [address] [place] [address] [phone]  Schedule an appointment as soon as possible for a visit          [place], Amadeo Garnet, MD 12/25/22 [phone]

## 2022-12-26 ENCOUNTER — Ambulatory Visit (INDEPENDENT_AMBULATORY_CARE_PROVIDER_SITE_OTHER): Payer: BC Managed Care – PPO | Admitting: Medical

## 2022-12-26 VITALS — BP 120/74 | HR 62 | Temp 97.7°F | Wt 128.6 lb

## 2022-12-26 DIAGNOSIS — M542 Cervicalgia: Secondary | ICD-10-CM | POA: Diagnosis not present

## 2022-12-26 DIAGNOSIS — G43709 Chronic migraine without aura, not intractable, without status migrainosus: Secondary | ICD-10-CM | POA: Diagnosis not present

## 2022-12-26 MED ORDER — PREDNISONE 20 MG PO TABS: ORAL_TABLET | ORAL | 0 refills | Status: DC

## 2022-12-26 MED ORDER — NURTEC 75 MG PO TBDP: 1.0000 | ORAL_TABLET | Freq: Every day | ORAL | 0 refills | Status: DC | PRN

## 2022-12-26 NOTE — Progress Notes (Signed)
Subjective: Chief Complaint  Patient presents with   Headache    Headache- went to ER 12/24/22 and was given to migraine cocktail.    Here for follow-up on headaches.  She has a neurologist, and she has a Midwife.  Been dealing with both migraines as well as chronic neck pain.  She had a recent scan of her neck.  Over the weekend she had an intractable headache and went to the emergency department.  She had of having a migraine cocktail but that did not seem to really help.  Still has a headache this morning.  She is not sure if her headaches are coming from the migraines or from the chronic neck issues or even TMJ.  She has tenderness over her right face along the TMJ area.  She sometimes bites down on her teeth during the day or at night.  Does not currently wear a bite guard.  She keeps some tension in her neck and shoulder.  The medicine at the emergency department helped her shoulder pain but not her neck and headache.  She took some Excedrin over-the-counter yesterday.  She has been prescribed tizanidine but has not been using this.  Similarly her neurologist gave her Effexor to help with some of her pains and headache issues but she has not really started this either.  She has used muscle laxer's and tizanidine before.  They tend to make her sleepy.  Her Maxalt does not seem to help.  She has had cluster headaches in the past and oxygen did not help either.  Prednisone and blood pressure medicine helped the cluster headaches.  Neurosurgery for her physical therapy but she has not gotten a call back from them about the therapy appointment   She was going to integrative therapies but prefers [facility] better.  No other aggravating or relieving factors.    No other c/o.  Past Medical History:  Diagnosis Date   Abnormal Pap smear 1988   treated with cryo   Anemia 11/2010   hematology consult prior; etiology malabsorption and uterine bleeding   Anxiety    COVID-19  10/28/2020   Gastric polyp    GERD (gastroesophageal reflux disease)    H/O bone density study 12/2007   H/O hysterectomy for benign disease 5/14   History of echocardiogram 03/2010   normal LV function, EF 60-65%, mild left atrial enlargement   HTN (hypertension) 03/2010   hospitalization for HTN urgency   Hyperlipemia    Internal hemorrhoids    Lumbar degenerative disc disease    Migraine    Myalgia    Numbness and tingling    Prediabetes    Spondylosis, cervical    Umbilical hernia    Uterine fibroid    hx/o    Current Outpatient Medications on File Prior to Visit  Medication Sig Dispense Refill   acetaminophen (TYLENOL) 500 MG tablet Take 1 tablet (500 mg total) by mouth every 6 (six) hours as needed. 30 tablet 0   ALPRAZolam (XANAX) 0.5 MG tablet Take 0.5 mg by mouth as needed for anxiety.     amLODipine (NORVASC) 5 MG tablet Take 1 tablet (5 mg total) by mouth daily. 90 tablet 3   Ascorbic Acid (VITAMIN C) 1000 MG tablet Take 1,000 mg by mouth daily.     atorvastatin (LIPITOR) 20 MG tablet TAKE 1 TABLET BY MOUTH EVERY DAY 30 tablet 5   B Complex Vitamins (B COMPLEX PO) Take 1 tablet by mouth daily.  calcium carbonate (OS-CAL) 600 MG TABS Take 600 mg by mouth daily.     CINNAMON PO Take 1 tablet by mouth daily.      diclofenac Sodium (VOLTAREN) 1 % GEL Apply 2 g topically 4 (four) times daily.     esomeprazole (NEXIUM) 20 MG capsule Take 1 capsule (20 mg total) by mouth daily at 12 noon. 1 capsule 0   estradiol (VIVELLE-DOT) 0.025 MG/24HR PLACE 1 PATCH ONTO THE SKIN 2 TIMES A WEEK. 24 patch 3   Estradiol (YUVAFEM) 10 MCG TABS vaginal tablet PLACE ONE TABLET VAGINALLY TWICE WEEKLY AT BEDTIME 24 tablet 3   fish oil-omega-3 fatty acids 1000 MG capsule Take 1 g by mouth daily.     fluticasone (FLONASE) 50 MCG/ACT nasal spray Place 2 sprays into both nostrils daily. 16 g 2   hydroxychloroquine (PLAQUENIL) 200 MG tablet TAKE 1 TABLET BY MOUTH TWICE A DAY ON MONDAYS THROUGH  FRIDAYS ONLY 120 tablet 0   meloxicam (MOBIC) 7.5 MG tablet Take 1 tablet (7.5 mg total) by mouth daily as needed for pain. 30 tablet 3   Multiple Vitamin (MULTIVITAMIN) tablet Take 1 tablet by mouth daily.     neomycin-polymyxin-hydrocortisone (CORTISPORIN) 3.5-10000-1 OTIC suspension Place 3 drops into both ears 4 (four) times daily. 10 mL 0   Probiotic Product (PROBIOTIC ADVANCED PO) Take 1 tablet by mouth daily.      rizatriptan (MAXALT-MLT) 5 MG disintegrating tablet Take 1 tablet (5 mg total) by mouth as needed for migraine. May repeat in 2 hours if needed 10 tablet 11   tiZANidine (ZANAFLEX) 4 MG tablet Take 1 tablet (4 mg total) by mouth at bedtime as needed for muscle spasms. 30 tablet 3   TURMERIC PO Take 1 tablet by mouth daily.      pantoprazole (PROTONIX) 40 MG tablet Take 1 tablet (40 mg total) by mouth daily. (Patient not taking: Reported on 12/26/2022) 90 tablet 0   venlafaxine XR (EFFEXOR XR) 37.5 MG 24 hr capsule Take 1 capsule (37.5 mg total) by mouth daily with breakfast. (Patient not taking: Reported on 12/26/2022) 30 capsule 11   No current facility-administered medications on file prior to visit.     The following portions of the patient's history were reviewed and updated as appropriate: allergies, current medications, past family history, past medical history, past social history, past surgical history and problem list.  ROS Otherwise as in subjective above  Objective: BP 120/74   Pulse 62   Temp 97.7 F (36.5 C)   Wt 128 lb 9.6 oz (58.3 kg)   LMP 10/25/2012 (Approximate)   BMI 22.07 kg/m   General appearance: alert, no distress, well developed, well nourished HEENT: normocephalic, sclerae anicteric, conjunctiva pink and moist, TMs pearly, nares patent, somewhat tender over the right TMJ, no discharge or erythema, pharynx normal Neck: supple, somewhat tender posterior lateral muscles bilaterally, anterior surgical scar noted, otherwise no lymphadenopathy, no  thyromegaly, no masses Upper back and arms nontender Neuro: CN II through XII intact, nonfocal exam   Assessment: Encounter Diagnoses  Name Primary?   Cervicalgia Yes   Chronic migraine w/o aura, not intractable, w/o stat migr      Plan: We discussed her symptoms, concerns.  I reviewed her recent emergency department notes and her last neurology notes.  For migraines we are going to try samples of Nurtec to see if that helps better than Maxalt.  Discussed risk and benefits and proper use of medicine.  With her current headache does not  resolve from the emergency department visit over the weekend, begin a short 3-day prednisone taper.  She wanted to do a smaller shorter taper than the regular steroid taper.  She I did encourage her to try the tizanidine once or twice a day over the next few days particularly at bedtime to help with some of the tension in the neck and the TMJ area.  Avoid heavy chewing or talking when possible since she is having some TMJ tenderness as well.  Referral to [facility].  We discussed pool exercise.  Continue stretching.  Of note, not currently using Effexor.  Follow-up with neurology  [place] was seen today for headache.  Diagnoses and all orders for this visit:  Cervicalgia -     Ambulatory referral to Physical Therapy  Chronic migraine w/o aura, not intractable, w/o stat migr  Other orders -     Rimegepant Sulfate (NURTEC) 75 MG TBDP; Take 1 tablet (75 mg total) by mouth daily as needed. -     predniSONE (DELTASONE) 20 MG tablet; 3 tablets today, 2 tablets tomorrow, 1 tablet the third day    Follow up: call report in 2 weeks on current symptoms and treatment

## 2022-12-28 ENCOUNTER — Ambulatory Visit: Payer: BC Managed Care – PPO | Admitting: Physician Assistant

## 2022-12-30 NOTE — Progress Notes (Signed)
Office Visit Note  Patient: Shari Lawrence             Date of Birth: 10/11/1960           MRN: [id number]             PCP: Jac Canavan, PA-C Referring: Jac Canavan, PA-C Visit Date: 01/02/2023 Occupation: @GUAROCC @  Subjective:  Neck pain   History of Present Illness: Shari Lawrence is a 62 y.o. female with history of autoimmune disease and osteoarthritis.  Patient is taking  Plaquenil 200 mg 1 tablet by mouth daily twice daily Monday through Friday only.  She is tolerating Plaquenil without any side effects.  Patient presents today with several new concerns.  She states that she has had several recent infections including ear infection, sinusitis, and COVID-19 infection.  She states that she has been having some changes with vision but denies any double vision.  She is scheduled for an upcoming appointment with ophthalmology on 01/17/2023 for further evaluation.  She is also scheduled for an upcoming appointment with neurology in August 2024.  Patient has been going to physical therapy to improve her neck pain and stiffness.  She continues to have trapezius muscle tension tenderness bilaterally.  She did not notice any benefit after having a trigger point injection performed at her last office visit.  She is taking meloxicam 7.5 mg 1 tablet daily as needed for pain relief.  Patient is concerned about the possible diagnosis of multiple sclerosis.   Activities of Daily Living:  Patient reports morning stiffness for 0  none .   Patient Reports nocturnal pain.  Difficulty dressing/grooming: Denies Difficulty climbing stairs: Reports Difficulty getting out of chair: Denies Difficulty using hands for taps, buttons, cutlery, and/or writing: Reports  Review of Systems  Constitutional:  Positive for fatigue.  HENT:  Positive for mouth dryness. Negative for mouth sores.   Eyes:  Positive for dryness.  Respiratory:  Negative for shortness of breath.   Cardiovascular:   Negative for chest pain and palpitations.  Gastrointestinal:  Negative for blood in stool, constipation and diarrhea.  Endocrine: Negative for increased urination.  Genitourinary:  Negative for involuntary urination.  Musculoskeletal:  Positive for joint pain, joint pain, myalgias, muscle weakness, muscle tenderness and myalgias. Negative for gait problem, joint swelling and morning stiffness.  Skin:  Positive for hair loss and sensitivity to sunlight. Negative for color change and rash.  Allergic/Immunologic: Negative for susceptible to infections.  Neurological:  Positive for dizziness and headaches.  Hematological:  Negative for swollen glands.  Psychiatric/Behavioral:  Positive for depressed mood and sleep disturbance. The patient is nervous/anxious.     PMFS History:  Patient Active Problem List   Diagnosis Date Noted   Chronic migraine w/o aura, not intractable, w/o stat migr 10/11/2022   Neck pain 10/11/2022   Subcutaneous nodule of head 09/28/2022   Imbalance 04/29/2022   Scoliosis 09/14/2021   Chronic right-sided thoracic back pain 07/28/2020   Cervicalgia 07/28/2020   Prediabetes    Leg numbness 05/04/2020   Knee swelling 05/04/2020   Forehead pain 05/04/2020   History of recent stressful life event 11/20/2019   Panic attack 11/15/2019   Sleep disturbance 11/13/2019   Bilateral carpal tunnel syndrome 08/21/2019   Chronic pain of both knees 06/14/2019   Right arm pain 06/14/2019   Close exposure to COVID-19 virus 06/14/2019   Paresthesia 04/08/2019   Radicular pain in right arm 03/30/2019   Vaccine counseling 05/21/2018  Need for influenza vaccination 05/21/2018   Routine general medical examination at a health care facility 05/21/2018   Leg pain, diffuse, left 06/28/2017   Leg swelling 06/28/2017   Bruising 06/28/2017   Myofascial muscle pain 06/28/2017   High risk medication use 03/30/2017   Chronic constipation 11/17/2016   Globus sensation 11/17/2016    Primary osteoarthritis of both knees 10/05/2016   Autoimmune disease (HCC) 09/29/2016   Primary osteoarthritis of both hands 09/29/2016   Family history of lupus erythematosus 09/20/2016   Polyarthralgia 05/06/2016   Clenching of teeth 05/06/2016   Family history of rheumatoid arthritis 05/06/2016   Essential hypertension 12/23/2014   History of anemia 12/23/2014   Post-operative state 11/13/2012   Hyperlipidemia 11/11/2012   Impaired fasting glucose 10/25/2011   Gluten intolerance 10/25/2011   Gastroesophageal reflux disease without esophagitis 10/25/2011   Anxiety    Iron deficiency anemia     Past Medical History:  Diagnosis Date   Abnormal Pap smear 1988   treated with cryo   Anemia 11/2010   hematology consult prior; etiology malabsorption and uterine bleeding   Anxiety    COVID-19 10/28/2020   Gastric polyp    GERD (gastroesophageal reflux disease)    H/O bone density study 12/2007   H/O hysterectomy for benign disease 5/14   History of echocardiogram 03/2010   normal LV function, EF 60-65%, mild left atrial enlargement   HTN (hypertension) 03/2010   hospitalization for HTN urgency   Hyperlipemia    Internal hemorrhoids    Lumbar degenerative disc disease    Migraine    Myalgia    Numbness and tingling    Prediabetes    Spondylosis, cervical    Umbilical hernia    Uterine fibroid    hx/o     Family History  Problem Relation Age of Onset   Hypertension Mother    Dementia Mother    Colon cancer Mother 21   Deep vein thrombosis Father    Hypertension Father    Cancer Father        prostate CA   Heart disease Father 59       CAD   Parkinsonism Father    Hypertension Sister    Hyperlipidemia Sister    Sjogren's syndrome Sister    Hypertension Brother    Hyperlipidemia Brother    Hypertension Maternal Aunt    Hyperlipidemia Maternal Aunt    Rheum arthritis Maternal Aunt    Diabetes Maternal Aunt        1 with type II, 1 with type 1   Hypertension  Brother    Rheum arthritis Brother    Diabetes Paternal Aunt        type II   Breast cancer Neg Hx    Past Surgical History:  Procedure Laterality Date   ABDOMINAL HYSTERECTOMY     CERVICAL FUSION  2010   CERVICAL SPINE SURGERY     CERVIX LESION DESTRUCTION  1988   CESAREAN SECTION     CHOLECYSTECTOMY  2003   COLONOSCOPY  08/2010   Dr. Loreta Ave   COLONOSCOPY  02/2017   Dr. Loreta Ave   ESOPHAGOGASTRODUODENOSCOPY  08/2010   Dr. 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  Has 54 yo daughter.  Her siblings and her don't agree on helping take care of their parents, causes family tension.  As of 11/2014      Right-handed.   0.5 cup caffeine per day.   Her father lives with her.      Update 09/2019--father passed away from urosepsis in Oct 01, 2019  Immunization History  Administered Date(s) Administered   COVID-19, mRNA, vaccine(Comirnaty)12 years and older 07/11/2022   Influenza,inj,Quad PF,6+ Mos 05/01/2013, 03/31/2014, 05/17/2017, 05/21/2018, 03/29/2019, 04/10/2020, 06/11/2021, 05/17/2022   PFIZER Comirnaty(Gray Top)Covid-19 Tri-Sucrose Vaccine 10/08/2020   PFIZER(Purple Top)SARS-COV-2 Vaccination 08/24/2019, 09/17/2019, 03/30/2020   Td 02/04/2012   Zoster Recombinant(Shingrix) 09/15/2020, 12/08/2020     Objective: Vital Signs: BP 129/82 (BP Location: Left Arm, Patient Position: Sitting, Cuff Size: Normal)   Pulse 61   Resp 14   Ht 5\' 4"  (1.626 m)   Wt 130 lb (59 kg)    LMP 10/25/2012 (Approximate)   BMI 22.31 kg/m    Physical Exam Vitals and nursing note reviewed.  Constitutional:      Appearance: She is well-developed.  HENT:     Head: Normocephalic and atraumatic.  Eyes:     Conjunctiva/sclera: Conjunctivae normal.  Cardiovascular:     Rate and Rhythm: Normal rate and regular rhythm.     Heart sounds: Normal heart sounds.  Pulmonary:     Effort: Pulmonary effort is normal.     Breath sounds: Normal breath sounds.  Abdominal:     General: Bowel sounds are normal.     Palpations: Abdomen is soft.  Musculoskeletal:     Cervical back: Normal range of motion.  Lymphadenopathy:     Cervical: No cervical adenopathy.  Skin:    General: Skin is warm and dry.     Capillary Refill: Capillary refill takes less than 2 seconds.  Neurological:     Mental Status: She is alert and oriented to person, place, and time.  Psychiatric:        Behavior: Behavior normal.      Musculoskeletal Exam: Generalized hyperalgesia noted.  Painful limited range of motion of the C-spine.  Trapezius muscle tension and tenderness bilaterally.  Shoulder joints have good range of motion with some discomfort of the right shoulder.  Elbow joints, wrist joints, MCPs, PIPs, DIPs have good range of motion with no synovitis.  Complete fist formation bilaterally.  Hip joints have good range of motion with some discomfort bilaterally.  Knee joints have good range of motion with no warmth or effusion.  Ankle joints have good range of motion with no tenderness or joint swelling.  CDAI Exam: CDAI Score: -- Patient Global: --; Provider Global: -- Swollen: --; Tender: -- Joint Exam 01/02/2023   No joint exam has been documented for this visit   There is currently no information documented on the homunculus. Go to the Rheumatology activity and complete the homunculus joint exam.  Investigation: No additional findings.  Imaging: CT VENOGRAM HEAD  Result Date: 12/24/2022 CLINICAL  DATA:  Initial evaluation for headache EXAM: CT VENOGRAM HEAD TECHNIQUE: Venographic phase images of the brain were obtained following the administration of intravenous contrast. Multiplanar reformats and maximum intensity projections were generated. RADIATION DOSE REDUCTION: This exam was performed  according to the departmental dose-optimization program which includes automated exposure control, adjustment of the mA and/or kV according to patient size and/or use of iterative reconstruction technique. CONTRAST:  75mL OMNIPAQUE IOHEXOL 350 MG/ML SOLN COMPARISON:  None Available. FINDINGS: Brain: Cerebral volume within normal limits for patient age. No evidence for acute intracranial hemorrhage. No findings to suggest acute large vessel territory infarct. No mass lesion, midline shift, or mass effect. Ventricles are normal in size without evidence for hydrocephalus. No extra-axial fluid collection identified. Vascular: No hyperdense vessel seen prior to contrast administration, following contrast administration, normal enhancement seen throughout the superior sagittal sinus to the torcula. Transverse and sigmoid sinuses are patent as are the jugular bulbs and visualized proximal internal jugular veins. Right transverse sinus dominant. Straight sinus, vein of Galen, and internal cerebral veins are patent. No appreciable abnormality about the cavernous sinus. No appreciable cortical vein abnormality. No other abnormal enhancement. Skull: Scalp soft tissues demonstrate no acute abnormality. Calvarium intact. Sinuses/Orbits: Globes and orbital soft tissues within normal limits. Visualized paranasal sinuses are largely clear. No mastoid effusion. IMPRESSION: 1. Normal CT venogram. No evidence for dural sinus thrombosis. 2. No other acute intracranial abnormality. Electronically Signed   By: Rise Mu M.D.   On: 12/24/2022 23:42   MR CERVICAL SPINE WO CONTRAST  Result Date: 12/08/2022  [facility] [address], [address] [place], [place] [address] [phone] NEUROIMAGING REPORT STUDY DATE: 12/07/2022 PATIENT NAME: Candelaria Edleman DOB: [birth year] MRN: [id number] ORDERING CLINICIAN: Dr. Frances Furbish CLINICAL HISTORY: 62 year old patient being evaluated for neck pain COMPARISON FILMS: MRI C-spine report 08/04/2020 EXAM: MRI cervical spine without contrast TECHNIQUE: Sagittal T1, T2, STIR, axial T2 GRE and T2 images were obtained through cervical spine CONTRAST: None IMAGING SITE: [facility] at [address] FINDINGS: The cervical vertebrae show evidence of anterior cervical fusion at C5-C7 with metallic artifact from postoperative changes.  There is mild posterior subluxation of C4 vertebrae.  C2-3 shows mild disc degenerative change and facet hypertrophy on the left but without significant narrowing. C3-4 shows mild disc and facet degenerative changes present without significant compression. C4-5 shows broad-based disc osteophyte protrusion along with ligamentum flavum hypertrophy and facet hypertrophy resulting in mild canal narrowing and left-sided foraminal narrowing.  There is no cord signal abnormality. C 5-6 and C 6/7  shows postoperative changes of anterior cervical fusion with metallic artifact but adequate patency of the canal and no compression.  Foraminal narrowing. C7-T1 shows mild disc and facet hypertrophy without significant compression. Spinal cord parenchyma shows no abnormal signal intensities.  Visualized portion of the lower brainstem and craniovertebral junction appear normal.  Visualized upper thoracic vertebrae also show minor disc degenerative changes.  The paraspinal soft tissues appear unremarkable.   MRI scan cervical spine without contrast showing postoperative changes of anterior cervical fusion at C5-C7 and prominent disc degenerative change at C4-5 resulting in mild canal and foraminal narrowing. INTERPRETING PHYSICIAN: Delia Heady, MD Certified in  Neuroimaging by  American Society of Neuroimaging and [place] [employer]specialities   Recent Labs: Lab Results  Component Value Date   WBC 4.8 12/24/2022   HGB 11.3 (L) 12/24/2022   PLT 183 12/24/2022   NA 143 12/24/2022   K 3.7 12/24/2022   CL 103 12/24/2022   CO2 25 12/24/2022   GLUCOSE 120 (H) 12/24/2022   BUN 14 12/24/2022   CREATININE 1.01 (H) 12/24/2022   BILITOT 0.5 08/06/2022   ALKPHOS 81 08/06/2022   AST 29 08/06/2022  ALT 25 08/06/2022   PROT 8.4 (H) 08/06/2022   ALBUMIN 5.2 (H) 08/06/2022   CALCIUM 9.9 12/24/2022   GFRAA 70 11/19/2020    Speciality Comments: PLQ Eye Exam: 10/06/2022 WNL @ [facility]. Follow up in 1 year.    Procedures:  No procedures performed Allergies: Quinolones, Codone [hydrocodone], Garlic, Latex, Levaquin [levofloxacin], Losartan, Metoprolol, Onion, Penicillins, Percocet [oxycodone-acetaminophen], Sulfa drugs cross reactors, Vicodin [hydrocodone-acetaminophen], and Gluten meal     Assessment / Plan:     Visit Diagnoses: Autoimmune disease (HCC) - ANA 1:320 NO, dsDNA8, RF-,CCP-, History of nasal ulcers and oral ulcers in the past, positive synovitis in left hand MCPs on [place] previously: She has no synovitis on examination today.  She continues to have chronic sicca symptoms.  No recent oral or nasal ulcerations.  No cervical lymphadenopathy was noted.  She has not had any recent rashes.  She remains on Plaquenil 200 mg 1 tablet by mouth twice daily Monday through Friday.  She is tolerating Plaquenil without any side effects and has not missed any doses recently.  Patient has had several new neurologic concerns as well as increased pain and stiffness in the C-spine.  She has an upcoming appointment with her ophthalmologist and neurologist for further evaluation. Lab work from 08/09/22 was reviewed today in the office: dsDNA 7, complements WNL, and ESR WNL.  The following lab work will be obtained today for further evaluation.  Future orders were  also placed to have checked if she develops signs or symptoms of a flare in the future.  She will remain on Plaquenil as prescribed.  She will follow-up in the office in 5 months or sooner if needed.  - Plan: Protein / creatinine ratio, urine, CBC with Differential/Platelet, COMPLETE METABOLIC PANEL WITH GFR, C3 and C4, Anti-DNA antibody, double-stranded, Sedimentation rate, CK, ANA, Protein / creatinine ratio, urine, CBC with Differential/Platelet, COMPLETE METABOLIC PANEL WITH GFR, Anti-DNA antibody, double-stranded, C3 and C4, Sedimentation rate  Sicca complex (HCC) -Chronic sicca symptoms-wax and wane.   Plan: CBC with Differential/Platelet, COMPLETE METABOLIC PANEL WITH GFR  High risk medication use - Plaquenil 200 mg 1 tablet by mouth daily twice daily Monday through Friday only. PLQ Eye Exam: 10/06/2022 WNL @ [facility]. Follow up in 1 year.  CBC and BMP updated on 12/24/22.   - Plan: CBC with Differential/Platelet, COMPLETE METABOLIC PANEL WITH GFR  Lateral epicondylitis, right elbow: Not currently symptomatic.   Primary osteoarthritis of both hands: No synovitis noted on examination today.  She experiences stiffness in both hands first thing in the morning but has not noticed any tenderness or inflammation.  Primary osteoarthritis of both knees: She has good range of motion of both knee joints on examination today.  No warmth or effusion noted.  Trochanteric bursitis of both hips: Not currently symptomatic.  Myofascial muscle pain - She continues to experience intermittent myalgias and muscle tenderness consistent with myofascial pain.  She has ongoing trapezius muscle tension and tenderness bilaterally.  She had a trigger point injection performed on 08/09/2022 which did not alleviate her symptoms.  She has been going to physical therapy for chronic neck pain which has started to provide some relief. Patient had an MRI of the cervical spine on 12/07/2022 and results reviewed today in  the office: Postoperative changes of anterior cervical fusion at C5-C7 and prominent disc degeneration of C4-C5 resulting in mild canal and foraminal narrowing noted.  She has an upcoming appointment with neurology.  She has also concerned  about the possible diagnosis of multiple sclerosis and will be seeking further workup.  She has noticed some intermittent weakness in her right upper extremity and is unsure if it is from her cervical spine or due to another underlying medical condition.  Plan to check CK today.  Plan: CK  Other medical conditions are listed as follows:  Family history of rheumatoid arthritis  Family history of lupus erythematosus  History of hypertension: Blood pressure was 129/82 today in the office.  History of hyperlipidemia  History of anxiety  Vitamin D deficiency  History of gastroesophageal reflux (GERD)  Orders: Orders Placed This Encounter  Procedures   Protein / creatinine ratio, urine   CBC with Differential/Platelet   COMPLETE METABOLIC PANEL WITH GFR   C3 and C4   Anti-DNA antibody, double-stranded   Sedimentation rate   CK   ANA   Protein / creatinine ratio, urine   CBC with Differential/Platelet   COMPLETE METABOLIC PANEL WITH GFR   Anti-DNA antibody, double-stranded   C3 and C4   Sedimentation rate   No orders of the defined types were placed in this encounter.    Follow-Up Instructions: Return in about 5 months (around 06/04/2023) for Autoimmune Disease, Osteoarthritis.   Gearldine Bienenstock, PA-C  Note - This record has been created using Dragon software.  Chart creation errors have been sought, but may not always  have been located. Such creation errors do not reflect on  the standard of medical care.

## 2023-01-02 ENCOUNTER — Ambulatory Visit: Payer: BC Managed Care – PPO | Attending: Physician Assistant | Admitting: Physician Assistant

## 2023-01-02 ENCOUNTER — Encounter: Payer: Self-pay | Admitting: Physician Assistant

## 2023-01-02 VITALS — BP 129/82 | HR 61 | Resp 14 | Ht 64.0 in | Wt 130.0 lb

## 2023-01-02 DIAGNOSIS — M7918 Myalgia, other site: Secondary | ICD-10-CM

## 2023-01-02 DIAGNOSIS — M3500 Sicca syndrome, unspecified: Secondary | ICD-10-CM | POA: Diagnosis not present

## 2023-01-02 DIAGNOSIS — M7062 Trochanteric bursitis, left hip: Secondary | ICD-10-CM

## 2023-01-02 DIAGNOSIS — M7711 Lateral epicondylitis, right elbow: Secondary | ICD-10-CM | POA: Diagnosis not present

## 2023-01-02 DIAGNOSIS — Z79899 Other long term (current) drug therapy: Secondary | ICD-10-CM | POA: Diagnosis not present

## 2023-01-02 DIAGNOSIS — M19041 Primary osteoarthritis, right hand: Secondary | ICD-10-CM

## 2023-01-02 DIAGNOSIS — Z8261 Family history of arthritis: Secondary | ICD-10-CM

## 2023-01-02 DIAGNOSIS — Z840 Family history of diseases of the skin and subcutaneous tissue: Secondary | ICD-10-CM

## 2023-01-02 DIAGNOSIS — E559 Vitamin D deficiency, unspecified: Secondary | ICD-10-CM

## 2023-01-02 DIAGNOSIS — Z8719 Personal history of other diseases of the digestive system: Secondary | ICD-10-CM

## 2023-01-02 DIAGNOSIS — M19042 Primary osteoarthritis, left hand: Secondary | ICD-10-CM

## 2023-01-02 DIAGNOSIS — Z8679 Personal history of other diseases of the circulatory system: Secondary | ICD-10-CM

## 2023-01-02 DIAGNOSIS — Z8659 Personal history of other mental and behavioral disorders: Secondary | ICD-10-CM

## 2023-01-02 DIAGNOSIS — M359 Systemic involvement of connective tissue, unspecified: Secondary | ICD-10-CM

## 2023-01-02 DIAGNOSIS — Z8639 Personal history of other endocrine, nutritional and metabolic disease: Secondary | ICD-10-CM

## 2023-01-02 DIAGNOSIS — M7061 Trochanteric bursitis, right hip: Secondary | ICD-10-CM

## 2023-01-02 DIAGNOSIS — M170 Bilateral primary osteoarthritis of knee: Secondary | ICD-10-CM

## 2023-01-03 LAB — PROTEIN / CREATININE RATIO, URINE
Creatinine, Urine: 115 mg/dL (ref 20–275)
Protein/Creat Ratio: 87 mg/g creat (ref 24–184)
Protein/Creatinine Ratio: 0.087 mg/mg creat (ref 0.024–0.184)
Total Protein, Urine: 10 mg/dL (ref 5–24)

## 2023-01-03 LAB — C3 AND C4
C3 Complement: 134 mg/dL (ref 83–193)
C4 Complement: 22 mg/dL (ref 15–57)

## 2023-01-03 LAB — COMPLETE METABOLIC PANEL WITH GFR
AG Ratio: 1.9 (calc) (ref 1.0–2.5)
ALT: 24 U/L (ref 6–29)
AST: 26 U/L (ref 10–35)
Albumin: 4.4 g/dL (ref 3.6–5.1)
Alkaline phosphatase (APISO): 80 U/L (ref 37–153)
BUN: 18 mg/dL (ref 7–25)
CO2: 28 mmol/L (ref 20–32)
Calcium: 9.8 mg/dL (ref 8.6–10.4)
Chloride: 108 mmol/L (ref 98–110)
Creat: 0.88 mg/dL (ref 0.50–1.05)
Globulin: 2.3 g/dL (calc) (ref 1.9–3.7)
Glucose, Bld: 89 mg/dL (ref 65–99)
Potassium: 3.9 mmol/L (ref 3.5–5.3)
Sodium: 142 mmol/L (ref 135–146)
Total Bilirubin: 0.4 mg/dL (ref 0.2–1.2)
Total Protein: 6.7 g/dL (ref 6.1–8.1)
eGFR: 75 mL/min/{1.73_m2} (ref >=60)

## 2023-01-03 LAB — CBC WITH DIFFERENTIAL/PLATELET
Absolute Monocytes: 432 cells/uL (ref 200–950)
Basophils Absolute: 40 cells/uL (ref 0–200)
Basophils Relative: 1 %
Eosinophils Absolute: 160 cells/uL (ref 15–500)
Eosinophils Relative: 4 %
HCT: 34.3 % — ABNORMAL LOW (ref 35.0–45.0)
Hemoglobin: 11.3 g/dL — ABNORMAL LOW (ref 11.7–15.5)
Lymphs Abs: 1892 cells/uL (ref 850–3900)
MCH: 30 pg (ref 27.0–33.0)
MCHC: 32.9 g/dL (ref 32.0–36.0)
MCV: 91 fL (ref 80.0–100.0)
MPV: 12.5 fL (ref 7.5–12.5)
Monocytes Relative: 10.8 %
Neutro Abs: 1476 cells/uL — ABNORMAL LOW (ref 1500–7800)
Neutrophils Relative %: 36.9 %
Platelets: 162 10*3/uL (ref 140–400)
RBC: 3.77 10*6/uL — ABNORMAL LOW (ref 3.80–5.10)
RDW: 13 % (ref 11.0–15.0)
Total Lymphocyte: 47.3 %
WBC: 4 10*3/uL (ref 3.8–10.8)

## 2023-01-03 LAB — CK: Total CK: 104 U/L (ref 29–143)

## 2023-01-03 LAB — ANTI-DNA ANTIBODY, DOUBLE-STRANDED: ds DNA Ab: 6 IU/mL — ABNORMAL HIGH

## 2023-01-03 LAB — ANA: Anti Nuclear Antibody (ANA): NEGATIVE

## 2023-01-03 LAB — SEDIMENTATION RATE: Sed Rate: 11 mm/h (ref 0–30)

## 2023-01-03 NOTE — Progress Notes (Signed)
CK WNL Complements WNL Protein creatinine ratio WNL  ESR WNL  Patient remains anemic-stable.  CMP WNL

## 2023-01-04 NOTE — Progress Notes (Signed)
ANA is negative. dsDNA is in an indeterminate range and continues to trend down. Labs are not consistent with an autoimmune disease flare.

## 2023-01-11 ENCOUNTER — Ambulatory Visit: Payer: BC Managed Care – PPO | Admitting: Rheumatology

## 2023-01-12 ENCOUNTER — Telehealth: Payer: Self-pay | Admitting: Neurology

## 2023-01-12 NOTE — Telephone Encounter (Signed)
Called patient and she just wanted to make sure she would be able to discuss symptoms at her next appointment. I told her that this should be fine with her medical history that we have seen her for previously. She was grateful and said that she would be here for her 08.01 appointment with Dr. Terrace Arabia

## 2023-01-12 NOTE — Telephone Encounter (Signed)
Pt said, have a new symptom; left arm feeling like it is asleep. Feeling this all through the night.  Informed pt we would need a referral for new symptom.

## 2023-01-13 ENCOUNTER — Telehealth: Payer: BC Managed Care – PPO | Admitting: Medical

## 2023-01-24 ENCOUNTER — Other Ambulatory Visit: Payer: Self-pay | Admitting: Gastroenterology

## 2023-01-24 DIAGNOSIS — R1032 Left lower quadrant pain: Secondary | ICD-10-CM

## 2023-01-25 ENCOUNTER — Encounter: Payer: Self-pay | Admitting: Gastroenterology

## 2023-01-25 ENCOUNTER — Other Ambulatory Visit: Payer: Self-pay | Admitting: Medical

## 2023-01-25 ENCOUNTER — Other Ambulatory Visit (HOSPITAL_COMMUNITY)
Admission: RE | Admit: 2023-01-25 | Discharge: 2023-01-25 | Disposition: A | Discharge status: Home / Self Care | Payer: BC Managed Care – PPO | Source: Ambulatory Visit | Attending: Obstetrics and Gynecology | Admitting: Obstetrics and Gynecology

## 2023-01-25 ENCOUNTER — Telehealth: Payer: Self-pay | Admitting: Obstetrics and Gynecology

## 2023-01-25 ENCOUNTER — Ambulatory Visit (INDEPENDENT_AMBULATORY_CARE_PROVIDER_SITE_OTHER): Payer: BC Managed Care – PPO | Admitting: Obstetrics and Gynecology

## 2023-01-25 ENCOUNTER — Encounter: Payer: Self-pay | Admitting: Obstetrics and Gynecology

## 2023-01-25 VITALS — BP 114/80 | HR 59 | Ht 64.0 in | Wt 127.0 lb

## 2023-01-25 DIAGNOSIS — Z1231 Encounter for screening mammogram for malignant neoplasm of breast: Secondary | ICD-10-CM

## 2023-01-25 DIAGNOSIS — R102 Pelvic and perineal pain: Secondary | ICD-10-CM | POA: Insufficient documentation

## 2023-01-25 LAB — URINALYSIS, COMPLETE W/RFL CULTURE
Bilirubin Urine: NEGATIVE
Casts: NONE SEEN /LPF
Crystals: NONE SEEN /HPF
Glucose, UA: NEGATIVE
Leukocyte Esterase: NEGATIVE
Nitrites, Initial: NEGATIVE
RBC / HPF: NONE SEEN /HPF (ref 0–2)
Specific Gravity, Urine: 1.026 (ref 1.001–1.035)
Yeast: NONE SEEN /HPF
pH: 6.5 (ref 5.0–8.0)

## 2023-01-25 LAB — CULTURE INDICATED

## 2023-01-25 NOTE — Progress Notes (Unsigned)
GYNECOLOGY  VISIT   HPI: 62 y.o.   Divorced  [place]  female   [id number] with Patient's last menstrual period was 10/25/2012 (approximate).   here for pelvic area discomfort. Pt noticed this pain about a month ago.  Has a CT scan abd/pelvis scheduled for next week.  Feels pressure in her pelvis.  When lies flat or sitting, she feels increased discomfort.   No vaginal odor or vaginal discharge.  Not sexually active for about 5 years.   Hysterectomy for fibroids and bleeding.  Still has her ovaries.   No dysuria.  Last UTI was years ago.  No blood in the urine.  Can have constipation, but not in a while. No blood in the stool.   Hx umbilical hernia repair.   No injuries.   Taking Tylenol and Meloxicam, which does not improve the pain in pelvis.   Sees rheumatology for positive ANA and double stranded DNA.  Has chronic back pain in neck and lower back.  Has herniated disc in her cervical spine.   Has chronic HA, migraine.   Taking MVI with amino acids in it.   GYNECOLOGIC HISTORY: Patient's last menstrual period was 10/25/2012 (approximate). Contraception:  hyst Menopausal hormone therapy:  estradiol Last mammogram:  01/05/22 Breast Density Cat C, BI-RADS CAT 1 neg Last pap smear:   06/30/21 neg: HR HPV neg, 04/20/17 neg: HR HPV neg        OB History     Gravida  1   Para  1   Term  1   Preterm  0   AB  0   Living  1      SAB  0   IAB  0   Ectopic  0   Multiple  0   Live Births  1              Patient Active Problem List   Diagnosis Date Noted   Chronic migraine w/o aura, not intractable, w/o stat migr 10/11/2022   Neck pain 10/11/2022   Subcutaneous nodule of head 09/28/2022   Imbalance 04/29/2022   Scoliosis 09/14/2021   Chronic right-sided thoracic back pain 07/28/2020   Cervicalgia 07/28/2020   Prediabetes    Leg numbness 05/04/2020   Knee swelling 05/04/2020   Forehead pain 05/04/2020   History of recent stressful life  event 11/20/2019   Panic attack 11/15/2019   Sleep disturbance 11/13/2019   Bilateral carpal tunnel syndrome 08/21/2019   Chronic pain of both knees 06/14/2019   Right arm pain 06/14/2019   Close exposure to COVID-19 virus 06/14/2019   Paresthesia 04/08/2019   Radicular pain in right arm 03/30/2019   Vaccine counseling 05/21/2018   Need for influenza vaccination 05/21/2018   Routine general medical examination at a health care facility 05/21/2018   Leg pain, diffuse, left 06/28/2017   Leg swelling 06/28/2017   Bruising 06/28/2017   Myofascial muscle pain 06/28/2017   High risk medication use 03/30/2017   Chronic constipation 11/17/2016   Globus sensation 11/17/2016   Primary osteoarthritis of both knees 10/05/2016   Autoimmune disease (HCC) 09/29/2016   Primary osteoarthritis of both hands 09/29/2016   Family history of lupus erythematosus 09/20/2016   Polyarthralgia 05/06/2016   Clenching of teeth 05/06/2016   Family history of rheumatoid arthritis 05/06/2016   Essential hypertension 12/23/2014   History of anemia 12/23/2014   Post-operative state 11/13/2012   Hyperlipidemia 11/11/2012   Impaired fasting glucose 10/25/2011   Gluten intolerance 10/25/2011  Gastroesophageal reflux disease without esophagitis 10/25/2011   Anxiety    Iron deficiency anemia     Past Medical History:  Diagnosis Date   Abnormal Pap smear 1988   treated with cryo   Anemia 11/2010   hematology consult prior; etiology malabsorption and uterine bleeding   Anxiety    COVID-19 10/28/2020   Gastric polyp    GERD (gastroesophageal reflux disease)    H/O bone density study 12/2007   H/O hysterectomy for benign disease 5/14   History of echocardiogram 03/2010   normal LV function, EF 60-65%, mild left atrial enlargement   HTN (hypertension) 03/2010   hospitalization for HTN urgency   Hyperlipemia    Internal hemorrhoids    Lumbar degenerative disc disease    Migraine    Myalgia    Numbness  and tingling    Prediabetes    Spondylosis, cervical    Umbilical hernia    Uterine fibroid    hx/o     Past Surgical History:  Procedure Laterality Date   ABDOMINAL HYSTERECTOMY     CERVICAL FUSION  2010   CERVICAL SPINE SURGERY     CERVIX LESION DESTRUCTION  1988   CESAREAN SECTION     CHOLECYSTECTOMY  2003   COLONOSCOPY  08/2010   Dr. Loreta Ave   COLONOSCOPY  02/2017   Dr. Loreta Ave   ESOPHAGOGASTRODUODENOSCOPY  08/2010   Dr. Loreta Ave   EXPLORATORY LAPAROTOMY     HERNIA REPAIR     MYOMECTOMY  1998   PELVIC LAPAROSCOPY     ROBOTIC ASSISTED LAPAROSCOPIC HYSTERECTOMY AND SALPINGECTOMY  10/2012   [facility]; laproscopic due to fibroids   UMBILICAL HERNIA REPAIR  10/2013   infected laparoscopic port, mesh placement    Current Outpatient Medications  Medication Sig Dispense Refill   acetaminophen (TYLENOL) 500 MG tablet Take 1 tablet (500 mg total) by mouth every 6 (six) hours as needed. 30 tablet 0   ALPRAZolam (XANAX) 0.5 MG tablet Take 0.5 mg by mouth as needed for anxiety.     amLODipine (NORVASC) 5 MG tablet Take 1 tablet (5 mg total) by mouth daily. 90 tablet 3   Ascorbic Acid (VITAMIN C) 1000 MG tablet Take 1,000 mg by mouth daily.     atorvastatin (LIPITOR) 20 MG tablet TAKE 1 TABLET BY MOUTH EVERY DAY 30 tablet 5   B Complex Vitamins (B COMPLEX PO) Take 1 tablet by mouth daily.     calcium carbonate (OS-CAL) 600 MG TABS Take 600 mg by mouth daily.     CINNAMON PO Take 1 tablet by mouth daily.      diclofenac Sodium (VOLTAREN) 1 % GEL Apply 2 g topically 4 (four) times daily.     esomeprazole (NEXIUM) 40 MG capsule Take 40 mg by mouth every morning.     estradiol (VIVELLE-DOT) 0.025 MG/24HR PLACE 1 PATCH ONTO THE SKIN 2 TIMES A WEEK. 24 patch 3   Estradiol (YUVAFEM) 10 MCG TABS vaginal tablet PLACE ONE TABLET VAGINALLY TWICE WEEKLY AT BEDTIME 24 tablet 3   fish oil-omega-3 fatty acids 1000 MG capsule Take 1 g by mouth daily.     fluticasone (FLONASE) 50 MCG/ACT nasal spray Place 2 sprays  into both nostrils daily. 16 g 2   hydroxychloroquine (PLAQUENIL) 200 MG tablet TAKE 1 TABLET BY MOUTH TWICE A DAY ON MONDAYS THROUGH FRIDAYS ONLY 120 tablet 0   meloxicam (MOBIC) 7.5 MG tablet Take 1 tablet (7.5 mg total) by mouth daily as needed for pain. 30 tablet  3   Multiple Vitamin (MULTIVITAMIN) tablet Take 1 tablet by mouth daily.     neomycin-polymyxin-hydrocortisone (CORTISPORIN) 3.5-10000-1 OTIC suspension Place 3 drops into both ears 4 (four) times daily. 10 mL 0   pantoprazole (PROTONIX) 40 MG tablet Take 1 tablet (40 mg total) by mouth daily. 90 tablet 0   predniSONE (DELTASONE) 20 MG tablet 3 tablets today, 2 tablets tomorrow, 1 tablet the third day 6 tablet 0   Probiotic Product (PROBIOTIC ADVANCED PO) Take 1 tablet by mouth daily.      tiZANidine (ZANAFLEX) 4 MG tablet Take 1 tablet (4 mg total) by mouth at bedtime as needed for muscle spasms. 30 tablet 3   TURMERIC PO Take 1 tablet by mouth daily.      No current facility-administered medications for this visit.     ALLERGIES: Quinolones, Codone [hydrocodone], Garlic, Latex, Levaquin [levofloxacin], Losartan, Metoprolol, Onion, Penicillins, Percocet [oxycodone-acetaminophen], Sulfa drugs cross reactors, Vicodin [hydrocodone-acetaminophen], and Gluten meal  Family History  Problem Relation Age of Onset   Hypertension Mother    Dementia Mother    Colon cancer Mother 7   Deep vein thrombosis Father    Hypertension Father    Cancer Father        prostate CA   Heart disease Father 50       CAD   Parkinsonism Father    Hypertension Sister    Hyperlipidemia Sister    Sjogren's syndrome Sister    Hypertension Brother    Hyperlipidemia Brother    Hypertension Maternal Aunt    Hyperlipidemia Maternal Aunt    Rheum arthritis Maternal Aunt    Diabetes Maternal Aunt        1 with type II, 1 with type 1   Hypertension Brother    Rheum arthritis Brother    Diabetes Paternal Aunt        type II   Breast cancer Neg Hx      Social History   Socioeconomic History   Marital status: Divorced    Spouse name: Not on file   Number of children: 1   Years of education: college   Highest education level: Doctorate  Occupational History    Employer: [employer]    Comment: Programmer, systems, PhD candidate, [employer]  Tobacco Use   Smoking status: Never    Passive exposure: Never   Smokeless tobacco: Never  Vaping Use   Vaping status: Never Used  Substance and Sexual Activity   Alcohol use: No   Drug use: No   Sexual activity: Not Currently    Partners: Male    Birth control/protection: Surgical    Comment: hysterectomy  Other Topics Concern   Not on file  Social History Narrative   Single, 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  Has 14 yo daughter.  Her siblings and her don't agree on helping take care of their parents, causes family tension.  As of 11/2014      Right-handed.   0.5 cup caffeine per day.   Her father lives with her.      Update 09/2019--father passed away from urosepsis in September 05, 2019  Social Determinants of Health   Financial Resource Strain: Not on file  Food Insecurity: Not on file  Transportation Needs: Not on file  Physical Activity: Not on file  Stress: Not on file  Social Connections: Unknown (11/07/2021)   Received from [facility]   Social Network    Social Network: Not on file  Intimate Partner Violence: Unknown (09/29/2021)   Received from [facility], [facility]   HITS    Physically Hurt: Not on file    Insult or Talk Down To: Not on file    Threaten Physical Harm: Not on file    Scream or Curse: Not on file    Review of Systems  Genitourinary:  Positive for pelvic pain.    PHYSICAL EXAMINATION:    BP 114/80 (BP Location: Right Arm,  Patient Position: Sitting, Cuff Size: Normal)   Pulse (!) 59   Ht 5\' 4"  (1.626 m)   Wt 127 lb (57.6 kg)   LMP 10/25/2012 (Approximate)   SpO2 99%   BMI 21.80 kg/m     General appearance: alert, cooperative and appears stated age   Pelvic: External genitalia:  no lesions              Urethra:  normal appearing urethra with no masses, tenderness or lesions              Bartholins and Skenes: normal                 Vagina: normal appearing vagina with normal color and discharge, no lesions              Cervix: absent                Bimanual Exam:  Uterus:  absent              Adnexa: no mass, fullness, tenderness              Rectal exam: yes.  Confirms.              Anus:  normal sphincter tone, no lesions  Chaperone was present for exam:  Warren Lacy, CMA  ASSESSMENT  Pelvic pain and pressure.  Uncertain etiology. No acute abdomen.  Status post hysterectomy.  Ovaries remain. Chronic back pain.   PLAN  Vaginitis testing.  Urine and UC.  Pelvic US at [facility].    23 min  total time was spent for this patient encounter, including preparation, face-to-face counseling with the patient, coordination of care, and documentation of the encounter.

## 2023-01-25 NOTE — Telephone Encounter (Signed)
Please schedule pelvic US and [facility] for pelvic pain and pressure.

## 2023-01-26 ENCOUNTER — Ambulatory Visit: Payer: BC Managed Care – PPO | Admitting: Neurology

## 2023-01-26 ENCOUNTER — Encounter: Payer: Self-pay | Admitting: Neurology

## 2023-01-26 VITALS — BP 149/92 | HR 74 | Ht 64.0 in | Wt 127.4 lb

## 2023-01-26 DIAGNOSIS — M542 Cervicalgia: Secondary | ICD-10-CM | POA: Diagnosis not present

## 2023-01-26 DIAGNOSIS — G43709 Chronic migraine without aura, not intractable, without status migrainosus: Secondary | ICD-10-CM

## 2023-01-26 MED ORDER — UBRELVY 50 MG PO TABS: ORAL_TABLET | ORAL | 6 refills | Status: DC

## 2023-01-26 NOTE — Progress Notes (Signed)
Chief Complaint  Patient presents with   Follow-up    Rm16, alone NECK PAIN: pt stated that since last visit has not improved & sees PT. Activity makes it worse. Mainly right sided and includes shoulder. Today she is in pain from right side temporal region down through right clavicle Pain:6/10  MIGRAINE: daily migraines, triggers: lights, aromas, stress, fatigue, hunger, heat (ongoing 2 months) Cluster HA: have went away      ASSESSMENT AND PLAN  Shari Lawrence is a 62 y.o. female   History of cervical decompression surgery,Intermittent neck pain Chronic migraine headaches New onset worsening right nuchal area pain,  She has significant right nuchal line, mastoid area tenderness upon deep palpitation,  After discussed with patient, decided to proceed with trigger point injection,  She is to continue physical therapy, massage, warm compression, as needed NSAIDs,  DIAGNOSTIC DATA (LABS, IMAGING, TESTING) - I reviewed patient records, labs, notes, testing and imaging myself where available.  HISTORICAL Shari Lawrence is a 62 year old female, seen in request by rheumatologist Dr. Pollyann Savoy, and primary care physician Dr. Jac Canavan, for evaluation of neck pain, radiating pain to right arm, initial evaluation was on April 08, 2019.   Past medical history of hypertension, hyperlipidemia, autoimmune disease, taking Plaquenil.  She did have a history of cervical decompression surgery in 2010, prior to the surgery, she presented with right cervical radiculopathy.   Since April 2020, she began to notice recurrent right neck pain, radiating pain to right shoulder, right arm, sometimes woke her up from sleep, she is helping her elderly parents, sometimes bearing weight of her father with her right shoulder, in addition, she complains of intermittent bilateral lower extremity numbness since July 08, 2020denies significant gait abnormality, no bowel bladder incontinence.    She also complains of low back pain, but denies shooting pain to bilateral lower extremities.  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  UPDATE January 05 2021: She is overall doing much better, neck pain has improved, intermittent low back pain depending on her activity, stretching, lying flat usually helps, occasionally neck pain at the base of occipital region, rarely radiating pain, still has right hand intermittent numbness, previous EMG nerve conduction study February 2021 confirmed bilateral carpal tunnel syndromes, right side is more severe moderate  UPDATE October 11 2022: Shari Lawrence came in today complains of consistent neck pain, more on the right side, muscle tension, radiating pain to right parietal temporal region, with right eye discomfort, relieved by lying down,  She had long history of severe migraine headache, presenting with a lateralized retro-orbital area headache, light noise smell sensitivity,  Personally reviewed MRA of the brain and neck that was normal in February 2024   I reviewed her record from her rheumatologist Dr. Corliss Skains, history of mild fascia muscle pain, autoimmune disease, sicca complex, complains of muscle pain, generalized weakness,  ANA was positive with titer 1:320, positive double-stranded  DNA, rheumatoid factor was negative, CCP was negative, history of nasal and oral ulcer in the past, positive synovitis in the left hand,  Was treated with Plaquenil 200 mg twice a day,  UPDATE January 26 2023: She complains of severe right neck pain, radiating towards right temporal area over the past few months, causing constant right-sided headaches, right neck pain, she experienced a lot of anxiety, but is very hesitant to take medications, previously tolerating Lexapro,   PHYSICAL EXAM   Vitals:   01/26/23 1015  Weight: 127 lb 6.4 oz (57.8 kg)  Height: 5\' 4"  (1.626 m)    Body mass index is 22.83 kg/m.  PHYSICAL EXAMNIATION:  Gen: NAD, conversant, well nourised, well groomed                     Cardiovascular: Regular rate rhythm, no peripheral edema, warm, nontender. Eyes: Conjunctivae clear without exudates or hemorrhage Neck: Supple, no carotid bruits.  Significant tenderness at right mastoid, right upper nuchal line,  NEUROLOGICAL EXAM:  MENTAL STATUS: Speech/cognition: Anxious looking middle-age female, awake, alert, oriented to history taking and care conversation   CRANIAL NERVES: CN II: Visual fields are full to confrontation. Pupils are round equal and briskly reactive to light. CN III, IV, VI: extraocular movement are normal. No ptosis. CN V: Facial sensation is intact to light touch CN VII: Face is symmetric with normal eye closure  CN VIII: Hearing is normal to causal conversation. CN IX, X: Phonation is normal. CN XI: Head turning and shoulder shrug are intact  MOTOR: There is no pronator drift of out-stretched arms. Muscle bulk and tone are normal. Muscle strength is normal.  REFLEXES: Reflexes are 2+ and symmetric at the biceps, triceps, 3/3 knees, and ankles. Plantar responses are flexor.  SENSORY: Intact to light touch, pinprick and vibratory sensation are intact in fingers and toes.  COORDINATION: There is no trunk or limb dysmetria  noted.  GAIT/STANCE: Steady  REVIEW OF SYSTEMS:  Full 14 system review of systems performed and notable only for as above All other review of systems were negative.  ALLERGIES: Allergies  Allergen Reactions   Quinolones Anaphylaxis and Other (See Comments)    Whelps developed all over her body   Codone [Hydrocodone] Nausea And Vomiting    Patient states "she doesn't feel normal" when she takes this medication and that it affects her mentally.   Garlic    Latex Itching and Other (See Comments)    Skin yellowing   Levaquin [Levofloxacin] Hives    Throat swelling   Losartan     Intolerance, fluctuating BPs   Metoprolol     Self reported hypotension   Onion    Penicillins    Percocet [Oxycodone-Acetaminophen]    Sulfa Drugs Cross Reactors Itching, Swelling and Other (See Comments)    Red face   Vicodin [Hydrocodone-Acetaminophen] Itching and Nausea And Vomiting   Gluten Meal     Other reaction(s): Other (See Comments) IBS    HOME MEDICATIONS: Current Outpatient Medications  Medication Sig Dispense Refill   acetaminophen (TYLENOL) 500 MG tablet Take 1 tablet (500 mg total) by mouth every 6 (six) hours as needed. 30 tablet 0   ALPRAZolam (XANAX) 0.5 MG tablet Take 0.5 mg by mouth as needed for anxiety.     amLODipine (NORVASC) 5 MG tablet Take 1 tablet (5 mg total) by mouth daily. 90 tablet 3   Ascorbic Acid (VITAMIN C)  1000 MG tablet Take 1,000 mg by mouth daily.     atorvastatin (LIPITOR) 20 MG tablet TAKE 1 TABLET BY MOUTH EVERY DAY 30 tablet 5   B Complex Vitamins (B COMPLEX PO) Take 1 tablet by mouth daily.     calcium carbonate (OS-CAL) 600 MG TABS Take 600 mg by mouth daily.     CINNAMON PO Take 1 tablet by mouth daily.      diclofenac Sodium (VOLTAREN) 1 % GEL Apply 2 g topically 4 (four) times daily.     esomeprazole (NEXIUM) 40 MG capsule Take 40 mg by mouth every morning.     estradiol (VIVELLE-DOT) 0.025 MG/24HR PLACE 1 PATCH ONTO THE SKIN 2 TIMES A WEEK. 24  patch 3   Estradiol (YUVAFEM) 10 MCG TABS vaginal tablet PLACE ONE TABLET VAGINALLY TWICE WEEKLY AT BEDTIME 24 tablet 3   fish oil-omega-3 fatty acids 1000 MG capsule Take 1 g by mouth daily.     fluticasone (FLONASE) 50 MCG/ACT nasal spray Place 2 sprays into both nostrils daily. 16 g 2   hydroxychloroquine (PLAQUENIL) 200 MG tablet TAKE 1 TABLET BY MOUTH TWICE A DAY ON MONDAYS THROUGH FRIDAYS ONLY 120 tablet 0   meloxicam (MOBIC) 7.5 MG tablet Take 1 tablet (7.5 mg total) by mouth daily as needed for pain. 30 tablet 3   Multiple Vitamin (MULTIVITAMIN) tablet Take 1 tablet by mouth daily.     neomycin-polymyxin-hydrocortisone (CORTISPORIN) 3.5-10000-1 OTIC suspension Place 3 drops into both ears 4 (four) times daily. 10 mL 0   pantoprazole (PROTONIX) 40 MG tablet Take 1 tablet (40 mg total) by mouth daily. 90 tablet 0   predniSONE (DELTASONE) 20 MG tablet 3 tablets today, 2 tablets tomorrow, 1 tablet the third day 6 tablet 0   Probiotic Product (PROBIOTIC ADVANCED PO) Take 1 tablet by mouth daily.      tiZANidine (ZANAFLEX) 4 MG tablet Take 1 tablet (4 mg total) by mouth at bedtime as needed for muscle spasms. 30 tablet 3   TURMERIC PO Take 1 tablet by mouth daily.      No current facility-administered medications for this visit.    PAST MEDICAL HISTORY: Past Medical History:  Diagnosis Date   Abnormal Pap smear 1988   treated with cryo   Anemia 11/2010   hematology consult prior; etiology malabsorption and uterine bleeding   Anxiety    COVID-19 10/28/2020   Gastric polyp    GERD (gastroesophageal reflux disease)    H/O bone density study 12/2007   H/O hysterectomy for benign disease 5/14   History of echocardiogram 03/2010   normal LV function, EF 60-65%, mild left atrial enlargement   HTN (hypertension) 03/2010   hospitalization for HTN urgency   Hyperlipemia    Internal hemorrhoids    Lumbar degenerative disc disease    Migraine    Myalgia    Numbness and tingling     Prediabetes    Spondylosis, cervical    Umbilical hernia    Uterine fibroid    hx/o     PAST SURGICAL HISTORY: Past Surgical History:  Procedure Laterality Date   ABDOMINAL HYSTERECTOMY     CERVICAL FUSION  2010   CERVICAL SPINE SURGERY     CERVIX LESION DESTRUCTION  1988   CESAREAN SECTION     CHOLECYSTECTOMY  2003   COLONOSCOPY  08/2010   Dr. Loreta Ave   COLONOSCOPY  02/2017   Dr. Loreta Ave   ESOPHAGOGASTRODUODENOSCOPY  08/2010   Dr. Loreta Ave   EXPLORATORY LAPAROTOMY  HERNIA REPAIR     MYOMECTOMY  1998   PELVIC LAPAROSCOPY     ROBOTIC ASSISTED LAPAROSCOPIC HYSTERECTOMY AND SALPINGECTOMY  10/2012   [facility]; laproscopic due to fibroids   UMBILICAL HERNIA REPAIR  10/2013   infected laparoscopic port, mesh placement    FAMILY HISTORY: Family History  Problem Relation Age of Onset   Hypertension Mother    Dementia Mother    Colon cancer Mother 3   Deep vein thrombosis Father    Hypertension Father    Cancer Father        prostate CA   Heart disease Father 41       CAD   Parkinsonism Father    Hypertension Sister    Hyperlipidemia Sister    Sjogren's syndrome Sister    Hypertension Brother    Hyperlipidemia Brother    Hypertension Maternal Aunt    Hyperlipidemia Maternal Aunt    Rheum arthritis Maternal Aunt    Diabetes Maternal Aunt        1 with type II, 1 with type 1   Hypertension Brother    Rheum arthritis Brother    Diabetes Paternal Aunt        type II   Breast cancer Neg Hx     SOCIAL HISTORY: Social History   Socioeconomic History   Marital status: Divorced    Spouse name: Not on file   Number of children: 1   Years of education: college   Highest education level: Doctorate  Occupational History    Employer: [employer]    Comment: Programmer, systems, PhD candidate, [employer]  Tobacco Use   Smoking status: Never    Passive exposure: Never   Smokeless tobacco: Never  Vaping Use   Vaping status: Never Used  Substance and Sexual Activity    Alcohol use: No   Drug use: No   Sexual activity: Not Currently    Partners: Male    Birth control/protection: Surgical    Comment: hysterectomy  Other Topics Concern   Not on file  Social History Narrative   Single, 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  Has 37 yo daughter.  Her siblings and her don't agree on helping take care of their parents, causes family tension.  As of 11/2014      Right-handed.   0.5 cup caffeine per day.   Her father lives with her.      Update 09/2019--father passed away from urosepsis in 09-16-2019  Social Determinants of Health   Financial Resource Strain: Not on file  Food Insecurity: Not on file  Transportation Needs: Not on file  Physical Activity: Not on file  Stress: Not on file  Social Connections: Unknown (11/07/2021)   Received from [facility], [facility]   Social Network    Social Network: Not on file  Intimate Partner Violence: Unknown (09/29/2021)   Received from [facility], [facility]   HITS    Physically Hurt: Not on file    Insult or Talk Down To: Not on file    Threaten Physical Harm: Not on file    Scream or Curse: Not on file       Levert Feinstein, M.D. Ph.D.  [facility] [address] [place], [place] [address] Ph: [phone] Fax: [phone]  CC:  Tysinger, Kermit Balo, PA-C [address] [place],  [place] [address]  Tysinger, Kermit Balo, PA-C

## 2023-01-26 NOTE — Progress Notes (Signed)
   History: Persistent right nuchal area pain  Right occipital, temporal region trigger point injection  1.5 cc of bupivacaine 0.5% was mixed with 1.5 cc of betamethasone,   Injections were placed at right nuchal line, above right mastoid region where she had most tenderness, she tolerated the injection well, reported immediate relief of pain after injection,    There was no complications of the procedure were noted. Injections were made with a 27-gauge needle.

## 2023-01-26 NOTE — Telephone Encounter (Signed)
Call placed to [facility] Main scheduling, spoke with Byrd Hesselbach. Patient scheduled at [facility] on 8/2, arriving at 1615 for PUS. Will need to arrive with full bladder.   Patient notified and voiced understanding.   Routing to provider for final review. Patient is agreeable to disposition. Will close encounter.

## 2023-01-27 ENCOUNTER — Ambulatory Visit (HOSPITAL_BASED_OUTPATIENT_CLINIC_OR_DEPARTMENT_OTHER)
Admission: RE | Admit: 2023-01-27 | Discharge: 2023-01-27 | Disposition: A | Discharge status: Home / Self Care | Payer: BC Managed Care – PPO | Source: Ambulatory Visit | Attending: Obstetrics and Gynecology | Admitting: Obstetrics and Gynecology

## 2023-01-27 ENCOUNTER — Other Ambulatory Visit (HOSPITAL_COMMUNITY): Payer: Self-pay

## 2023-01-27 ENCOUNTER — Telehealth: Payer: Self-pay

## 2023-01-27 DIAGNOSIS — R102 Pelvic and perineal pain: Secondary | ICD-10-CM | POA: Insufficient documentation

## 2023-01-27 NOTE — Telephone Encounter (Signed)
Received a CMM request to do PA for Ubrelvy- I only found one triptan in chart-Please advise-

## 2023-01-30 NOTE — Telephone Encounter (Signed)
Please let patient know, that her insurance denied Bernita Raisin now,

## 2023-01-31 ENCOUNTER — Ambulatory Visit
Admission: RE | Admit: 2023-01-31 | Discharge: 2023-01-31 | Disposition: A | Discharge status: Home / Self Care | Payer: BC Managed Care – PPO | Source: Ambulatory Visit | Attending: Gastroenterology | Admitting: Gastroenterology

## 2023-01-31 DIAGNOSIS — R1032 Left lower quadrant pain: Secondary | ICD-10-CM

## 2023-01-31 MED ORDER — IOHEXOL 300 MG/ML  SOLN
100.0000 mL | Freq: Once | INTRAMUSCULAR | Status: AC | PRN
  Administered 2023-01-31: 100 mL via INTRAVENOUS

## 2023-02-02 ENCOUNTER — Other Ambulatory Visit: Payer: Self-pay | Admitting: Physician Assistant

## 2023-02-02 DIAGNOSIS — M359 Systemic involvement of connective tissue, unspecified: Secondary | ICD-10-CM

## 2023-02-02 NOTE — Telephone Encounter (Signed)
Last Fill: 10/17/2022  Eye exam: 01/17/2023 OCT is entirely reassuring. There are scattered defects OU on the 10/2 field, but they do not correlate to the OCT or the exam. Dr. Dione Booze will simply trend the field over time. No suggestion of toxicity. Okay to continue PLQ.    Labs: 01/02/2023 Patient remains anemic-stable. CMP WNL  Next Visit: 06/05/2023  Last Visit: 01/02/2023  DX: Autoimmune disease   Current Dose per office note 01/02/2023: Plaquenil 200 mg 1 tablet by mouth daily twice daily Monday through Friday only.   Okay to refill Plaquenil?

## 2023-02-05 ENCOUNTER — Encounter: Payer: Self-pay | Admitting: Neurology

## 2023-02-05 DIAGNOSIS — M542 Cervicalgia: Secondary | ICD-10-CM

## 2023-02-05 DIAGNOSIS — G43709 Chronic migraine without aura, not intractable, without status migrainosus: Secondary | ICD-10-CM

## 2023-02-06 MED ORDER — RIZATRIPTAN BENZOATE 5 MG PO TBDP: ORAL_TABLET | ORAL | 5 refills | Status: DC

## 2023-02-06 NOTE — Telephone Encounter (Signed)
Please see the MyChart message reply(ies) for my assessment and plan.    This patient gave consent for this Medical Advice Message and is aware that it may result in a bill to their insurance company, as well as the possibility of receiving a bill for a co-payment or deductible. They are an established patient, but are not seeking medical advice exclusively about a problem treated during an in person or video visit in the last seven days. I did not recommend an in person or video visit within seven days of my reply.    I spent a total of 5 minutes cumulative time within 7 days through MyChart messaging.  Yijun Yan, MD   

## 2023-02-07 ENCOUNTER — Ambulatory Visit
Admission: RE | Admit: 2023-02-07 | Discharge: 2023-02-07 | Disposition: A | Discharge status: Home / Self Care | Payer: BC Managed Care – PPO | Source: Ambulatory Visit | Attending: Medical | Admitting: Medical

## 2023-02-07 DIAGNOSIS — Z1231 Encounter for screening mammogram for malignant neoplasm of breast: Secondary | ICD-10-CM

## 2023-02-09 NOTE — Progress Notes (Signed)
Results sent through MyChart

## 2023-02-23 NOTE — Telephone Encounter (Signed)
Please let patient's know, her insurance has denied CGRP antagonist Ubrelvy 50 mg as needed,  Check to see if the trigger point injection January 26, 2023 has not helped her neck pain, headache  If she still have frequent recurrent headache, we may offer low-dose of Imitrex 25 mg as needed, may also combine with NSAIDs

## 2023-02-23 NOTE — Telephone Encounter (Signed)
Received another PA request from PT pharmacy-Still awaiting a reply to previous question in this note-Please advise.

## 2023-03-02 ENCOUNTER — Ambulatory Visit: Payer: BC Managed Care – PPO | Admitting: Medical

## 2023-03-02 VITALS — BP 120/80 | HR 50 | Temp 97.0°F | Wt 126.6 lb

## 2023-03-02 DIAGNOSIS — L729 Follicular cyst of the skin and subcutaneous tissue, unspecified: Secondary | ICD-10-CM

## 2023-03-02 NOTE — Progress Notes (Signed)
Subjective:  Shari Lawrence is a 62 y.o. female who presents for Chief Complaint  Patient presents with   knot under breast bone    Knot under breast bone. Noticed it on Tuesday- some pain     Here for knot on chest around breast bone.  Just noticed a knot few days ago with seatbelt.  Mildly tender.   No other aggravating or relieving factors.    No other c/o.  The following portions of the patient's history were reviewed and updated as appropriate: allergies, current medications, past family history, past medical history, past social history, past surgical history and problem list.  ROS Otherwise as in subjective above  Objective: BP 120/80   Pulse (!) 50   Temp (!) 97 F (36.1 C)   Wt 126 lb 9.6 oz (57.4 kg)   LMP 10/25/2012 (Approximate)   BMI 21.73 kg/m   General appearance: alert, no distress, well developed, well nourished Very small <55mm diameter mobile subcutaneous lump over mid manubrium of sternum approx 2 cm from sternal notch.  No erythema, induration, warmth or fluctuance.  Mildly tender   Assessment: Encounter Diagnosis  Name Primary?   Cyst of subcutaneous tissue Yes     Plan: We discussed the findings.  Reassured that this appears to be a benign cyst that is quite small.  No other suggestion of lymph nodes in this general area.  Discussed difference between cyst and lymph nodes in this area.  Reassured  Shari Lawrence was seen today for knot under breast bone.  Diagnoses and all orders for this visit:  Cyst of subcutaneous tissue    Follow up: prn

## 2023-03-04 ENCOUNTER — Other Ambulatory Visit: Payer: Self-pay | Admitting: Physician Assistant

## 2023-03-04 ENCOUNTER — Other Ambulatory Visit: Payer: Self-pay | Admitting: Medical

## 2023-03-04 DIAGNOSIS — M359 Systemic involvement of connective tissue, unspecified: Secondary | ICD-10-CM

## 2023-03-30 ENCOUNTER — Other Ambulatory Visit: Payer: Self-pay | Admitting: Neurology

## 2023-04-10 ENCOUNTER — Encounter: Payer: Self-pay | Admitting: Neurology

## 2023-04-10 ENCOUNTER — Ambulatory Visit (INDEPENDENT_AMBULATORY_CARE_PROVIDER_SITE_OTHER): Payer: BC Managed Care – PPO | Admitting: Neurology

## 2023-04-10 VITALS — BP 118/74 | Ht 64.0 in | Wt 126.0 lb

## 2023-04-10 DIAGNOSIS — G43709 Chronic migraine without aura, not intractable, without status migrainosus: Secondary | ICD-10-CM

## 2023-04-10 DIAGNOSIS — M542 Cervicalgia: Secondary | ICD-10-CM | POA: Diagnosis not present

## 2023-04-10 DIAGNOSIS — M7918 Myalgia, other site: Secondary | ICD-10-CM

## 2023-04-10 MED ORDER — EMGALITY 120 MG/ML ~~LOC~~ SOAJ: 120.0000 mg | SUBCUTANEOUS | 11 refills | Status: DC

## 2023-04-10 MED ORDER — EMGALITY 120 MG/ML ~~LOC~~ SOAJ: 240.0000 mg | SUBCUTANEOUS | 0 refills | Status: DC

## 2023-04-10 NOTE — Patient Instructions (Addendum)
Try Emgality as migraine prevention, start loading dose 2 shots the 1st month, then once a month loading dose for maintenance. Try for 3-4 months to see full benefit. Let us know how it helps. My chart message with benefit.   Meds ordered this encounter  Medications   Galcanezumab-gnlm (EMGALITY) 120 MG/ML SOAJ    Sig: Inject 240 mg into the skin every 30 (thirty) days.    Dispense:  2 mL    Refill:  0    Please fill this 1st as loading dose   Galcanezumab-gnlm (EMGALITY) 120 MG/ML SOAJ    Sig: Inject 120 mg into the skin every 30 (thirty) days.    Dispense:  1.12 mL    Refill:  11    Fill this 2nd as maintenance dosing

## 2023-04-10 NOTE — Progress Notes (Signed)
Patient: Shari Lawrence Date of Birth: Dec 02, 1960  Reason for Visit: Follow up History from: Patient Primary Neurologist: Terrace Arabia   ASSESSMENT AND PLAN 62 y.o. year old female   1.  History of cervical decompression surgery 2.  Chronic migraine headaches 3.  History right sided neck pain  -Dr. Terrace Arabia performed right sided occipital area nerve block, previously had 01/26/23 with resolution of right occipital, mastoid pain -Has continued with right-sided eye pain, often right occipital pain radiates to the eye, with migraine features -Start Emgality 240 mg loading dose for migraine preventative, followed by monthly maintenance dosing 120 mg monthly, also has history of cluster headaches, will do Emgality as migraine trial,also may benefit if cluster -Continue massage, heat, stretching for right sided neck pain -Next steps: Indomethacin -Tried and failed: Bernita Raisin denied, Maxalt lack of benefit, Cymbalta, gabapentin, allergy to Celebrex, did not start Effexor due to concern for side effect, Excedrin Migraine GI side effect -Follow-up in 6 months or sooner if needed, keep follow-up Dr. Franky Macho neurosurgery  HISTORY  Shari Lawrence is a 62 year old female, seen in request by rheumatologist Dr. Pollyann Savoy, and primary care physician Dr. Jac Canavan, for evaluation of neck pain, radiating pain to right arm, initial evaluation was on April 08, 2019.    Past medical history of hypertension, hyperlipidemia, autoimmune disease, taking Plaquenil.  She did have a history of cervical decompression surgery in 2010, prior to the surgery, she presented with right cervical radiculopathy.   Since April 2020, she began to notice recurrent right neck pain, radiating pain to right shoulder, right arm, sometimes woke her up from sleep, she is helping her elderly parents, sometimes bearing weight of her father with her right shoulder, in addition, she complains of intermittent bilateral lower  extremity numbness since 07/24/20denies significant gait abnormality, no bowel bladder incontinence.   She also complains of low back pain, but denies shooting pain to bilateral lower extremities.   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   UPDATE January 05 2021: She is overall doing much better, neck pain has improved, intermittent low back pain depending on her activity, stretching, lying flat usually helps, occasionally neck pain at the base of occipital region, rarely radiating pain, still has right hand intermittent numbness, previous EMG nerve conduction study February 2021 confirmed bilateral carpal tunnel syndromes, right side is more severe moderate   UPDATE October 11 2022: Greig Castilla came in today complains of consistent neck pain, more on the right side, muscle tension, radiating pain to right parietal temporal region, with right eye discomfort, relieved by lying down,   She had long history of severe migraine headache, presenting with a lateralized retro-orbital area headache, light noise smell sensitivity,   Personally reviewed MRA of the brain and neck that was normal in February 2024     I reviewed  her record from her rheumatologist Dr. Corliss Skains, history of mild fascia muscle pain, autoimmune disease, sicca  complex, complains of muscle pain, generalized weakness,   ANA was positive with titer 1:320, positive double-stranded DNA, rheumatoid factor was negative, CCP was negative, history of nasal and oral ulcer in the past, positive synovitis in the left hand,   Was treated with Plaquenil 200 mg twice a day,   UPDATE January 26 2023: She complains of severe right neck pain, radiating towards right temporal area over the past few months, causing constant right-sided headaches, right neck pain, she experienced a lot of anxiety, but is very hesitant to take medications, previously tolerating Lexapro,  Update April 10, 2023 SS: Has been doing PT 3 times a week, working with right shoulder, anywhere that hurts. Dry needling to her neck once, a lot of tension, unclear benefit. Insurance denied Bernita Raisin, tried Maxalt without benefit. Saw neurosurgery, referred to PT. Pain behind right ear, has dry eyes from autoimmune condition. History of cluster headache, behind right eye, eyes turn red, sensitive to light, nauseated. With nerve block resolution of right occipital pain. Right now pain 3/10, would like nerve block.   MRI cervical spine 12/07/22 IMPRESSION: MRI scan cervical spine without contrast showing postoperative changes of anterior cervical fusion at C5-C7 and prominent disc degenerative change at C4-5 resulting in mild canal and foraminal narrowing.   REVIEW OF SYSTEMS: Out of a complete 14 system review of symptoms, the patient complains only of the following symptoms, and all other reviewed systems are negative.  See HPI  ALLERGIES: Allergies  Allergen Reactions   Quinolones Anaphylaxis and Other (See Comments)    Whelps developed all over her body   Codone [Hydrocodone] Nausea And Vomiting    Patient states "she doesn't feel normal" when she takes this medication and that it affects her mentally.   Garlic    Latex Itching and Other (See Comments)    Skin yellowing   Levaquin [Levofloxacin]  Hives    Throat swelling   Losartan     Intolerance, fluctuating BPs   Metoprolol     Self reported hypotension   Onion    Penicillins    Percocet [Oxycodone-Acetaminophen]    Sulfa Drugs Cross Reactors Itching, Swelling and Other (See Comments)    Red face   Vicodin [Hydrocodone-Acetaminophen] Itching and Nausea And Vomiting   Gluten Meal     Other reaction(s): Other (See Comments) IBS    HOME MEDICATIONS: Outpatient Medications Prior to Visit  Medication Sig Dispense Refill   acetaminophen (TYLENOL) 500 MG tablet Take 1 tablet (500 mg total) by mouth every 6 (six) hours as needed. 30 tablet 0   ALPRAZolam (XANAX) 0.5 MG tablet Take 0.5 mg by mouth as needed for anxiety.     amLODipine (NORVASC) 5 MG tablet TAKE 1 TABLET (5 MG TOTAL) BY MOUTH DAILY. 90 tablet 1   Ascorbic Acid (VITAMIN C) 1000 MG tablet Take 1,000 mg by mouth daily.     atorvastatin (LIPITOR) 20 MG tablet TAKE 1 TABLET BY MOUTH EVERY DAY 30 tablet 5   B Complex Vitamins (B COMPLEX PO) Take 1 tablet by mouth daily.     calcium carbonate (OS-CAL) 600 MG TABS Take 600 mg by mouth daily.     CINNAMON PO Take 1 tablet by mouth daily.      diclofenac Sodium (VOLTAREN) 1 % GEL Apply 2 g topically 4 (four) times daily.     esomeprazole (NEXIUM) 40 MG capsule Take 40  mg by mouth every morning.     estradiol (VIVELLE-DOT) 0.025 MG/24HR PLACE 1 PATCH ONTO THE SKIN 2 TIMES A WEEK. 24 patch 3   Estradiol (YUVAFEM) 10 MCG TABS vaginal tablet PLACE ONE TABLET VAGINALLY TWICE WEEKLY AT BEDTIME 24 tablet 3   fish oil-omega-3 fatty acids 1000 MG capsule Take 1 g by mouth daily.     fluticasone (FLONASE) 50 MCG/ACT nasal spray Place 2 sprays into both nostrils daily. 16 g 2   hydroxychloroquine (PLAQUENIL) 200 MG tablet TAKE 1 TABLET BY MOUTH TWICE A DAY ON MONDAYS THROUGH FRIDAYS ONLY 120 tablet 0   meloxicam (MOBIC) 7.5 MG tablet TAKE 1 TABLET BY MOUTH DAILY AS NEEDED FOR PAIN 30 tablet 3   Multiple Vitamin (MULTIVITAMIN) tablet  Take 1 tablet by mouth daily.     pantoprazole (PROTONIX) 40 MG tablet Take 1 tablet (40 mg total) by mouth daily. 90 tablet 0   Probiotic Product (PROBIOTIC ADVANCED PO) Take 1 tablet by mouth daily.      rizatriptan (MAXALT-MLT) 5 MG disintegrating tablet May repeat in 2 hours if needed 10 tablet 5   tiZANidine (ZANAFLEX) 4 MG tablet Take 1 tablet (4 mg total) by mouth at bedtime as needed for muscle spasms. 30 tablet 3   TURMERIC PO Take 1 tablet by mouth daily.      Ubrogepant (UBRELVY) 50 MG TABS Take 1 tab at onset of migraine.  May repeat in 2 hrs, if needed.  Max dose: 2 tabs/day. This is a 30 day prescription. 10 tablet 6   No facility-administered medications prior to visit.    PAST MEDICAL HISTORY: Past Medical History:  Diagnosis Date   Abnormal Pap smear 1988   treated with cryo   Anemia 11/2010   hematology consult prior; etiology malabsorption and uterine bleeding   Anxiety    COVID-19 10/28/2020   Gastric polyp    GERD (gastroesophageal reflux disease)    H/O bone density study 12/2007   H/O hysterectomy for benign disease 5/14   History of echocardiogram 03/2010   normal LV function, EF 60-65%, mild left atrial enlargement   HTN (hypertension) 03/2010   hospitalization for HTN urgency   Hyperlipemia    Internal hemorrhoids    Lumbar degenerative disc disease    Migraine    Myalgia    Numbness and tingling    Prediabetes    Spondylosis, cervical    Umbilical hernia    Uterine fibroid    hx/o     PAST SURGICAL HISTORY: Past Surgical History:  Procedure Laterality Date   ABDOMINAL HYSTERECTOMY     CERVICAL FUSION  2010   CERVICAL SPINE SURGERY     CERVIX LESION DESTRUCTION  1988   CESAREAN SECTION     CHOLECYSTECTOMY  2003   COLONOSCOPY  08/2010   Dr. Loreta Ave   COLONOSCOPY  02/2017   Dr. Loreta Ave   ESOPHAGOGASTRODUODENOSCOPY  08/2010   Dr. Loreta Ave   EXPLORATORY LAPAROTOMY     HERNIA REPAIR     MYOMECTOMY  1998   PELVIC LAPAROSCOPY     ROBOTIC ASSISTED  LAPAROSCOPIC HYSTERECTOMY AND SALPINGECTOMY  10/2012   [facility]; laproscopic due to fibroids   UMBILICAL HERNIA REPAIR  10/2013   infected laparoscopic port, mesh placement    FAMILY HISTORY: Family History  Problem Relation Age of Onset   Hypertension Mother    Dementia Mother    Colon cancer Mother 35   Deep vein thrombosis Father    Hypertension Father  Cancer Father        prostate CA   Heart disease Father 1       CAD   Parkinsonism Father    Hypertension Sister    Hyperlipidemia Sister    Sjogren's syndrome Sister    Hypertension Brother    Hyperlipidemia Brother    Hypertension Maternal Aunt    Hyperlipidemia Maternal Aunt    Rheum arthritis Maternal Aunt    Diabetes Maternal Aunt        1 with type II, 1 with type 1   Hypertension Brother    Rheum arthritis Brother    Diabetes Paternal Aunt        type II   Breast cancer Neg Hx     SOCIAL HISTORY: Social History   Socioeconomic History   Marital status: Divorced    Spouse name: Not on file   Number of children: 1   Years of education: college   Highest education level: Doctorate  Occupational History    Employer: [employer]    Comment: Programmer, systems, PhD candidate, [employer]  Tobacco Use   Smoking status: Never    Passive exposure: Never   Smokeless tobacco: Never  Vaping Use   Vaping status: Never Used  Substance and Sexual Activity   Alcohol use: No   Drug use: No   Sexual activity: Not Currently    Partners: Male    Birth control/protection: Surgical    Comment: hysterectomy  Other Topics Concern   Not on file  Social History Narrative   Single, 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  Has 31 yo daughter.  Her siblings and her don't agree on helping take care of their parents, causes family  tension.  As of 11/2014      Right-handed.   0.5 cup caffeine per day.   Her father lives with her.      Update 09/2019--father passed away from urosepsis in 08-Oct-2019  Social Determinants of Health   Financial Resource Strain: Not on file  Food Insecurity: Not on file  Transportation Needs: Not on file  Physical Activity: Not on file  Stress: Not on file  Social Connections: Unknown (11/07/2021)   Received from [facility]   Social Network    Social Network: Not on file  Intimate Partner Violence: Unknown (09/29/2021)   Received from [facility], [facility]   HITS    Physically Hurt: Not on file    Insult or Talk Down To: Not on file    Threaten Physical Harm: Not on file    Scream or Curse: Not on file    PHYSICAL EXAM  Vitals:   04/10/23 1533  BP: 118/74  Weight: 126 lb (57.2 kg)  Height: 5\' 4"  (1.626 m)   Body mass index is 21.63 kg/m.  Generalized: Well developed, in no acute distress  Neurological examination  Mentation: Alert oriented to time, place, history taking. Follows all commands speech and language fluent Cranial nerve II-XII: Pupils were equal round reactive to light. Extraocular movements were full, visual field were full on confrontational test. Facial sensation and strength were normal. Head turning and shoulder shrug  were normal and symmetric. Motor: The motor testing reveals 5 over 5 strength of all 4 extremities. Good symmetric motor tone is noted throughout.  Sensory: Sensory testing is intact to soft touch on all 4 extremities. No evidence of extinction is noted.  Coordination: Cerebellar testing reveals good finger-nose-finger and heel-to-shin bilaterally.  Gait and station: Gait is normal. Wear tall leather boots Reflexes: Deep tendon reflexes are symmetric and normal bilaterally.   DIAGNOSTIC DATA (LABS, IMAGING, TESTING) - I reviewed patient records, labs, notes, testing and imaging myself where available.  Lab  Results  Component Value Date   WBC 4.0 01/02/2023   HGB 11.3 (L) 01/02/2023   HCT 34.3 (L) 01/02/2023   MCV 91.0 01/02/2023   PLT 162 01/02/2023      Component Value Date/Time   NA 142 01/02/2023 1436   NA 141 05/21/2018 1633   K 3.9 01/02/2023 1436   CL 108 01/02/2023 1436   CO2 28 01/02/2023 1436   GLUCOSE 89 01/02/2023 1436   BUN 18 01/02/2023 1436   BUN 12 05/21/2018 1633   CREATININE 0.88 01/02/2023 1436   CALCIUM 9.8 01/02/2023 1436   PROT 6.7 01/02/2023 1436   PROT 6.6 05/21/2018 1633   ALBUMIN 5.2 (H) 08/06/2022 1712   ALBUMIN 4.5 05/21/2018 1633   AST 26 01/02/2023 1436   ALT 24 01/02/2023 1436   ALKPHOS 81 08/06/2022 1712   BILITOT 0.4 01/02/2023 1436   BILITOT 0.4 05/21/2018 1633   GFRNONAA >60 12/24/2022 1944   GFRNONAA 60 11/19/2020 1411   GFRAA 70 11/19/2020 1411   Lab Results  Component Value Date   CHOL 170 05/17/2022   HDL 80 05/17/2022   LDLCALC 80 05/17/2022   TRIG 51 05/17/2022   CHOLHDL 2.1 05/17/2022   Lab Results  Component Value Date   HGBA1C 6.0 (H) 05/17/2022   Lab Results  Component Value Date   VITAMINB12 486 05/06/2016   Lab Results  Component Value Date   TSH 1.880 05/24/2022      History: Persistent right nuchal area pain   Right occipital, temporal region trigger point injection   1.5 cc of bupivacaine 0.5% was mixed with 1.5 cc of betamethasone,    Injections were placed at right nuchal line, at right occipital and temporal area  above right mastoid region where she had most tenderness followed the pain, she tolerated the injection well. The entire 3 cc's were used.    There was no complications of the procedure were noted. Injections were made with a 27-gauge needle.  Margie Ege, AGNP-C, DNP 04/10/2023, 5:08 PM [facility] [address], [address] [place], [place] [address] [phone]

## 2023-04-25 ENCOUNTER — Telehealth: Payer: Self-pay | Admitting: Pharmacy Technician

## 2023-04-25 ENCOUNTER — Other Ambulatory Visit (HOSPITAL_COMMUNITY): Payer: Self-pay

## 2023-04-25 NOTE — Telephone Encounter (Signed)
Pharmacy Patient Advocate Encounter   Received notification from CoverMyMeds that prior authorization for Emgality 120MG /ML auto-injectors (migraine) is required/requested.   Insurance verification completed.   The patient is insured through [facility] .   Per test claim: PA required; PA submitted to [facility] via CoverMyMeds Key/confirmation #/EOC [id number] Status is pending

## 2023-05-03 ENCOUNTER — Other Ambulatory Visit: Payer: Self-pay | Admitting: Medical

## 2023-05-03 NOTE — Telephone Encounter (Signed)
Pharmacy Patient Advocate Encounter  Received notification from [facility] that Prior Authorization for Emgality 120MG /ML auto-injectors (migraine) has been APPROVED from 04/25/2023 to 07/26/2023   PA #/Case ID/Reference #: [id number]

## 2023-05-03 NOTE — Telephone Encounter (Signed)
Pt has an appt in December 

## 2023-05-08 ENCOUNTER — Other Ambulatory Visit: Payer: Self-pay | Admitting: Physician Assistant

## 2023-05-08 DIAGNOSIS — M359 Systemic involvement of connective tissue, unspecified: Secondary | ICD-10-CM

## 2023-05-08 NOTE — Telephone Encounter (Signed)
Last Fill: 02/02/2023  Eye exam: 01/17/2023   Labs: 01/02/2023  ANA is negative. dsDNA is in an indeterminate range and continues to trend down. Labs are not consistent with an autoimmune disease flare. CK WNL Complements WNL Protein creatinine ratio WNL ESR WNL Patient remains anemic-stable. CMP WNL  Next Visit: 06/05/2023  Last Visit: 01/02/2023  DX:Autoimmune disease   Current Dose per office note 01/02/2023: - Plaquenil 200 mg 1 tablet by mouth daily twice daily Monday through Friday only.   Okay to refill Plaquenil?

## 2023-05-11 ENCOUNTER — Ambulatory Visit: Payer: BC Managed Care – PPO | Admitting: Orthopaedic Surgery

## 2023-05-14 ENCOUNTER — Encounter: Payer: Self-pay | Admitting: Neurology

## 2023-05-16 ENCOUNTER — Ambulatory Visit: Payer: BC Managed Care – PPO | Admitting: Medical

## 2023-05-16 VITALS — BP 124/64 | HR 75 | Temp 97.6°F | Wt 127.2 lb

## 2023-05-16 DIAGNOSIS — G894 Chronic pain syndrome: Secondary | ICD-10-CM | POA: Diagnosis not present

## 2023-05-16 DIAGNOSIS — M2550 Pain in unspecified joint: Secondary | ICD-10-CM

## 2023-05-16 DIAGNOSIS — M7918 Myalgia, other site: Secondary | ICD-10-CM

## 2023-05-16 DIAGNOSIS — H938X9 Other specified disorders of ear, unspecified ear: Secondary | ICD-10-CM

## 2023-05-16 DIAGNOSIS — L729 Follicular cyst of the skin and subcutaneous tissue, unspecified: Secondary | ICD-10-CM

## 2023-05-16 DIAGNOSIS — G43709 Chronic migraine without aura, not intractable, without status migrainosus: Secondary | ICD-10-CM | POA: Diagnosis not present

## 2023-05-16 DIAGNOSIS — E785 Hyperlipidemia, unspecified: Secondary | ICD-10-CM

## 2023-05-16 DIAGNOSIS — M542 Cervicalgia: Secondary | ICD-10-CM | POA: Diagnosis not present

## 2023-05-16 MED ORDER — FLUTICASONE PROPIONATE 50 MCG/ACT NA SUSP: 2.0000 | Freq: Every day | NASAL | 2 refills | Status: DC

## 2023-05-16 MED ORDER — GUAIFENESIN ER 600 MG PO TB12
600.0000 mg | ORAL_TABLET | Freq: Two times a day (BID) | ORAL | 0 refills | Status: DC
Start: 2023-05-16 — End: 2023-10-02

## 2023-05-16 NOTE — Progress Notes (Signed)
Subjective:  Shari Lawrence is a 62 y.o. female who presents for Chief Complaint  Patient presents with   Pain    Pain on back of head, neck pain, ear pain and abdominal pain. Seen Dr. Loreta Ave about her abdominal pain and then seeing Physical Therapy for 3 months 2-3 times a week and helped relieved the headache.      Here for multiple concerns.  She has chronic pain issues, sees rheumatology, neurology, physical therapy, orthopedics.Gets pain in right neck that shoots straight up to head and face. Last year when she saw neurology, they asked her to have right shoulder evaluated more.  She is not sure if her shoulder is giving her any problems or not but she just has pain in her upper back and neck on the right.    Been doing PT for 3 months, 2-3 times per week.  Pain has subsided but it can come back quite easily.  Has seen surgeon but at this point not pursuing neck surgery.  Still getting some stabbing pains in ears, popping in ears.    Did cyst that she had on her chest resolved or got smaller.  She has a new cyst on the left lateral leg that gives her some pain every now and then.  She wants me to go back and look over her scans from this year and labs in case she needs any other imaging or test at this time.  She sees [facility], doing acupuncture, [place] therapy, other therapies and feels like she is getting benefit there.  She uses some topical remedies including salonpas, voltarel gel sometimes, heat, oral meloxicam some.   Has not used cbd, but has used bath salts  No other aggravating or relieving factors.    No other c/o.  Past Medical History:  Diagnosis Date   Abnormal Pap smear 1988   treated with cryo   Anemia 11/2010   hematology consult prior; etiology malabsorption and uterine bleeding   Anxiety    COVID-19 10/28/2020   Gastric polyp    GERD (gastroesophageal reflux disease)    H/O bone density study 12/2007   H/O hysterectomy for benign  disease 5/14   History of echocardiogram 03/2010   normal LV function, EF 60-65%, mild left atrial enlargement   HTN (hypertension) 03/2010   hospitalization for HTN urgency   Hyperlipemia    Internal hemorrhoids    Lumbar degenerative disc disease    Migraine    Myalgia    Numbness and tingling    Prediabetes    Spondylosis, cervical    Umbilical hernia    Uterine fibroid    hx/o    Current Outpatient Medications on File Prior to Visit  Medication Sig Dispense Refill   acetaminophen (TYLENOL) 500 MG tablet Take 1 tablet (500 mg total) by mouth every 6 (six) hours as needed. 30 tablet 0   ALPRAZolam (XANAX) 0.5 MG tablet Take 0.5 mg by mouth as needed for anxiety.     amLODipine (NORVASC) 5 MG tablet TAKE 1 TABLET (5 MG TOTAL) BY MOUTH DAILY. 90 tablet 1   Ascorbic Acid (VITAMIN C) 1000 MG tablet Take 1,000 mg by mouth daily.     atorvastatin (LIPITOR) 20 MG tablet TAKE 1 TABLET BY MOUTH EVERY DAY 30 tablet 1   B Complex Vitamins (B COMPLEX PO) Take 1 tablet by mouth daily.     calcium carbonate (OS-CAL) 600 MG TABS Take 600 mg by mouth daily.  CINNAMON PO Take 1 tablet by mouth daily.      diclofenac Sodium (VOLTAREN) 1 % GEL Apply 2 g topically 4 (four) times daily.     esomeprazole (NEXIUM) 40 MG capsule Take 40 mg by mouth every morning.     estradiol (VIVELLE-DOT) 0.025 MG/24HR PLACE 1 PATCH ONTO THE SKIN 2 TIMES A WEEK. 24 patch 3   Estradiol (YUVAFEM) 10 MCG TABS vaginal tablet PLACE ONE TABLET VAGINALLY TWICE WEEKLY AT BEDTIME 24 tablet 3   fish oil-omega-3 fatty acids 1000 MG capsule Take 1 g by mouth daily.     Galcanezumab-gnlm (EMGALITY) 120 MG/ML SOAJ Inject 240 mg into the skin every 30 (thirty) days. 2 mL 0   Galcanezumab-gnlm (EMGALITY) 120 MG/ML SOAJ Inject 120 mg into the skin every 30 (thirty) days. 1.12 mL 11   hydroxychloroquine (PLAQUENIL) 200 MG tablet TAKE 1 TABLET BY MOUTH TWICE A DAY ON MONDAYS THROUGH FRIDAYS ONLY 120 tablet 0   meloxicam (MOBIC) 7.5  MG tablet TAKE 1 TABLET BY MOUTH DAILY AS NEEDED FOR PAIN 30 tablet 3   Multiple Vitamin (MULTIVITAMIN) tablet Take 1 tablet by mouth daily.     pantoprazole (PROTONIX) 40 MG tablet Take 1 tablet (40 mg total) by mouth daily. 90 tablet 0   Probiotic Product (PROBIOTIC ADVANCED PO) Take 1 tablet by mouth daily.      tiZANidine (ZANAFLEX) 4 MG tablet Take 1 tablet (4 mg total) by mouth at bedtime as needed for muscle spasms. 30 tablet 3   TURMERIC PO Take 1 tablet by mouth daily.      rizatriptan (MAXALT-MLT) 5 MG disintegrating tablet May repeat in 2 hours if needed (Patient not taking: Reported on 05/16/2023) 10 tablet 5   Ubrogepant (UBRELVY) 50 MG TABS Take 1 tab at onset of migraine.  May repeat in 2 hrs, if needed.  Max dose: 2 tabs/day. This is a 30 day prescription. (Patient not taking: Reported on 05/16/2023) 10 tablet 6   No current facility-administered medications on file prior to visit.     The following portions of the patient's history were reviewed and updated as appropriate: allergies, current medications, past family history, past medical history, past social history, past surgical history and problem list.  ROS Otherwise as in subjective above    Objective: BP 124/64   Pulse 75   Temp 97.6 F (36.4 C)   Wt 127 lb 3.2 oz (57.7 kg)   LMP 10/25/2012 (Approximate)   BMI 21.83 kg/m   General appearance: alert, no distress, well developed, well nourished HEENT: normocephalic, sclerae anicteric, conjunctiva pink and moist, TMs flat, some air fluid levels noted right ear, nares patent, no discharge or erythema, pharynx normal Oral cavity: MMM, no lesions Neck: supple, no lymphadenopathy, no thyromegaly, no masses Tender upper back in general on the right, no obvious spasm or nodule MSK: Right shoulder range of motion normal, no obvious deformity, no laxity, range of motion full, nontender to palpation.  Rest of arms unremarkable. Pulses: 2+ radial pulses, 2+ pedal pulses,  normal cap refill Ext: no edema Normal arm strength and sensation. There is a small subcutaneous nodule less than 5 mm diameter in the distal third lateral left leg seemingly within the IT band.  Somewhat tender to palpation.    Assessment: Encounter Diagnoses  Name Primary?   Chronic pain syndrome Yes   Cervicalgia    Chronic migraine w/o aura, not intractable, w/o stat migr    Polyarthralgia    Myofascial muscle pain  Hyperlipidemia, unspecified hyperlipidemia type    Pressure sensation in ear, unspecified laterality    Cyst of subcutaneous tissue      Plan: We discussed her multiple concerns.  I reviewed numerous imaging reports she had this year including vascular studies of the neck and the head, CT abdomen pelvis, CT chest, other x-rays from 2023 and 24.  Given her chronic pain, we discussed that she has been seeing multiple specialist, orthopedics, rheumatology, integrative therapy, neurology, physical therapy, has tried multiple modalities.  I recommended she take the next month or so to try going on a vegetarian diet, cut out sugars and junk food altogether, continue with her physical therapy it seems to be helping.  We discussed some topical creams that could be prescribed such as custom care pharmacy prescription for essential orals or other topicals  She will continue her current medications except I asked her to stop her cholesterol pill for the next month or more  We reviewed blood work she has had within the past year  Continue routine follow-up with her rheumatologist.  She sees orthopedics from time to time as well.  Reassured that her right shoulder does not seem to have an issue, exam completely normal  Cyst of skin left leg, reassured  Continue routine follow-up with neurology regarding migraines  Regarding ear pressure, begin back on Flonase for the next month or so, can use Mucinex short-term.  If not much improved over the next 2 weeks consider topical  eardrop  Yolonda was seen today for pain.  Diagnoses and all orders for this visit:  Chronic pain syndrome  Cervicalgia  Chronic migraine w/o aura, not intractable, w/o stat migr  Polyarthralgia  Myofascial muscle pain  Hyperlipidemia, unspecified hyperlipidemia type  Pressure sensation in ear, unspecified laterality  Cyst of subcutaneous tissue  Other orders -     fluticasone (FLONASE) 50 MCG/ACT nasal spray; Place 2 sprays into both nostrils daily. -     guaiFENesin (MUCINEX) 600 MG 12 hr tablet; Take 1 tablet (600 mg total) by mouth 2 (two) times daily.    Follow up: call report 3-4 weeks

## 2023-05-18 ENCOUNTER — Ambulatory Visit: Payer: BC Managed Care – PPO | Admitting: Medical

## 2023-05-22 NOTE — Progress Notes (Unsigned)
Office Visit Note  Patient: Shari Lawrence             Date of Birth: [birth year]           MRN: [id number]             PCP: Jac Canavan, PA-C Referring: Jac Canavan, PA-C Visit Date: 06/05/2023 Occupation: @GUAROCC @  Subjective:  Myalgias   History of Present Illness: Shari Lawrence is a 62 y.o. female with history of autoimmune disease. Patient remains on  Plaquenil 200 mg 1 tablet by mouth daily twice daily Monday through Friday only.  She is tolerating Plaquenil without any side effects and has not had any recent gaps in therapy.  She denies any signs or symptoms of an autoimmune disease flare recently.  She denies any oral or nasal ulcers.  She experiences intermittent dryness in both eyes.  She denies any recent rashes or raynaud's phenomenon.   Patient states she has been going to physical therapy 3 days a week and has been getting occipital blocks which has helped with the stiffness in her C-spine and frequent headaches. She denies any joint swelling currently.       Activities of Daily Living:  Patient reports morning stiffness for 15-20 minutes.   Patient Reports nocturnal pain.  Difficulty dressing/grooming: Denies Difficulty climbing stairs: Reports Difficulty getting out of chair: Reports Difficulty using hands for taps, buttons, cutlery, and/or writing: Reports  Review of Systems  Constitutional:  Negative for fatigue.  HENT:  Positive for mouth dryness and sore tongue. Negative for mouth sores.   Eyes:  Positive for pain and dryness.  Respiratory:  Negative for shortness of breath.   Cardiovascular:  Negative for chest pain and palpitations.  Gastrointestinal:  Negative for blood in stool, constipation and diarrhea.  Endocrine: Negative for increased urination.  Genitourinary:  Negative for involuntary urination.  Musculoskeletal:  Positive for joint pain, joint pain, morning stiffness and muscle tenderness. Negative for gait problem,  joint swelling, myalgias, muscle weakness and myalgias.  Skin:  Positive for hair loss and sensitivity to sunlight. Negative for color change and rash.  Allergic/Immunologic: Negative for susceptible to infections.  Neurological:  Positive for dizziness, numbness and headaches.  Hematological:  Negative for swollen glands.  Psychiatric/Behavioral:  Positive for sleep disturbance. Negative for depressed mood. The patient is nervous/anxious.     PMFS History:  Patient Active Problem List   Diagnosis Date Noted   Chronic pain syndrome 05/16/2023   Chronic migraine w/o aura, not intractable, w/o stat migr 10/11/2022   Neck pain 10/11/2022   Subcutaneous nodule of head 09/28/2022   Imbalance 04/29/2022   Scoliosis 09/14/2021   Chronic right-sided thoracic back pain 07/28/2020   Cervicalgia 07/28/2020   Prediabetes    Leg numbness 05/04/2020   Knee swelling 05/04/2020   History of recent stressful life event 11/20/2019   Panic attack 11/15/2019   Sleep disturbance 11/13/2019   Bilateral carpal tunnel syndrome 08/21/2019   Chronic pain of both knees 06/14/2019   Right arm pain 06/14/2019   Paresthesia 04/08/2019   Radicular pain in right arm 03/30/2019   Vaccine counseling 05/21/2018   Need for influenza vaccination 05/21/2018   Routine general medical examination at a health care facility 05/21/2018   Leg pain, diffuse, left 06/28/2017   Leg swelling 06/28/2017   Myofascial muscle pain 06/28/2017   High risk medication use 03/30/2017   Chronic constipation 11/17/2016   Globus sensation 11/17/2016   Primary osteoarthritis of  both knees 10/05/2016   Autoimmune disease (HCC) 09/29/2016   Primary osteoarthritis of both hands 09/29/2016   Family history of lupus erythematosus 09/20/2016   Polyarthralgia 05/06/2016   Family history of rheumatoid arthritis 05/06/2016   Essential hypertension 12/23/2014   History of anemia 12/23/2014   Hyperlipidemia 11/11/2012   Gluten intolerance  10/25/2011   Gastroesophageal reflux disease without esophagitis 10/25/2011   Anxiety    Iron deficiency anemia     Past Medical History:  Diagnosis Date   Anemia 11/2010   hematology consult prior; etiology malabsorption and uterine bleeding   Anxiety    COVID-19 10/28/2020   Gastric polyp    GERD (gastroesophageal reflux disease)    H/O bone density study 12/2007   H/O hysterectomy for benign disease 10/2012   History of echocardiogram 03/2010   normal LV function, EF 60-65%, mild left atrial enlargement   HTN (hypertension) 03/2010   hospitalization for HTN urgency   Hyperlipemia    Internal hemorrhoids    Lumbar degenerative disc disease    Migraine    Myalgia    Numbness and tingling    Prediabetes    Spondylosis, cervical    Umbilical hernia    Uterine fibroid    hx/o     Family History  Problem Relation Age of Onset   Hypertension Mother    Dementia Mother    Colon cancer Mother 96   Deep vein thrombosis Father    Hypertension Father    Cancer Father        prostate CA   Heart disease Father 50       CAD   Parkinsonism Father    Hypertension Sister    Hyperlipidemia Sister    Sjogren's syndrome Sister    Hypertension Brother    Hyperlipidemia Brother    Hypertension Brother    Rheum arthritis Brother    Hypertension Maternal Aunt    Hyperlipidemia Maternal Aunt    Rheum arthritis Maternal Aunt    Diabetes Maternal Aunt        1 with type II, 1 with type 1   Diabetes Paternal Aunt        type II   Breast cancer Neg Hx    Past Surgical History:  Procedure Laterality Date   ABDOMINAL HYSTERECTOMY     CERVICAL FUSION  2010   CERVICAL SPINE SURGERY     CERVIX LESION DESTRUCTION  1988   CESAREAN SECTION     CHOLECYSTECTOMY  2003   COLONOSCOPY  08/2010   Dr. Loreta Ave   COLONOSCOPY  02/2017   Dr. Loreta Ave   ESOPHAGOGASTRODUODENOSCOPY  08/2010   Dr. 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      Has 1 daughter.      Right-handed.   0.5 cup caffeine per day.      05/2024   Immunization History  Administered Date(s) Administered   Influenza,inj,Quad PF,6+ Mos 05/01/2013, 03/31/2014, 05/17/2017, 05/21/2018, 03/29/2019, 04/10/2020, 06/11/2021, 05/17/2022   PFIZER Comirnaty(Gray Top)Covid-19 Tri-Sucrose Vaccine 10/08/2020   PFIZER(Purple Top)SARS-COV-2 Vaccination  08/24/2019, 09/17/2019, 03/30/2020   Pfizer(Comirnaty)Fall Seasonal Vaccine 12 years and older 07/11/2022   Td 02/04/2012   Tdap 06/01/2023   Zoster Recombinant(Shingrix) 09/15/2020, 12/08/2020     Objective: Vital Signs: BP (!) 150/89 (BP Location: Left Arm, Patient Position: Sitting, Cuff Size: Normal)   Pulse (!) 51   Resp 14   Ht 5\' 4"  (1.626 m)   Wt 127 lb 12.8 oz (58 kg)   LMP 10/25/2012 (Approximate)   BMI 21.94 kg/m    Physical Exam Vitals and nursing note reviewed.  Constitutional:      Appearance: She is well-developed.  HENT:     Head: Normocephalic and atraumatic.  Eyes:     Conjunctiva/sclera: Conjunctivae normal.  Cardiovascular:     Rate and Rhythm: Normal rate and regular rhythm.     Heart sounds: Normal heart sounds.  Pulmonary:     Effort: Pulmonary effort is normal.     Breath sounds: Normal breath sounds.  Abdominal:     General: Bowel sounds are normal.     Palpations: Abdomen is soft.  Musculoskeletal:     Cervical back: Normal range of motion.  Lymphadenopathy:     Cervical: No cervical adenopathy.  Skin:    General: Skin is warm and dry.     Capillary Refill: Capillary refill takes less than 2 seconds.  Neurological:      Mental Status: She is alert and oriented to person, place, and time.  Psychiatric:        Behavior: Behavior normal.      Musculoskeletal Exam: C-spine has slightly limited range of motion with lateral rotation.  Trapezius muscle tension and tenderness bilaterally, right greater than left.  Shoulder joints, elbow joints, wrist joints, MCPs, PIPs, DIPs have good range of motion with no synovitis.  Complete fist formation bilaterally.  Hip joints have good range of motion with no groin pain.  Some tenderness along the IT band bilaterally.  Knee joints have good range of motion no warmth or effusion.  Ankle joints have good range of motion without tenderness or joint swelling.  CDAI Exam: CDAI Score: -- Patient Global: --; Provider Global: -- Swollen: --; Tender: -- Joint Exam 06/05/2023   No joint exam has been documented for this visit   There is currently no information documented on the homunculus. Go to the Rheumatology activity and complete the homunculus joint exam.  Investigation: No additional findings.  Imaging: DG BONE DENSITY (DXA)  Result Date: 06/01/2023 EXAM: DUAL X-RAY ABSORPTIOMETRY (DXA) FOR BONE MINERAL DENSITY IMPRESSION: Referring Physician:  Jac Canavan Your patient completed a bone mineral density test using GE Lunar iDXA system (analysis version: 16). Technologist: BEC PATIENT: Name: Allira, Buxbaum Patient ID: [id number] Birth Date: Jan 27, 1961 Height: 63.5 in. Sex: Female Measured: 06/01/2023 Weight: 126.6 lbs. Indications: Estrogen Deficient, Hysterectomy, Nexium, osteoarthritis, Plaquenil, Postmenopausal, Scoliosis Fractures: None Treatments: Calcium (E943.0), Estradiol Patch, Estradiol Vaginal Insert, Vitamin D (E933.5) ASSESSMENT: The BMD measured at Femur Neck Left is 0.838 g/cm2 with a T-score of -1.4. This patient is considered osteopenic/low bone mass according to World Health Organization [facility]) criteria. The quality of the exam is good. Site Region  Measured Date Measured Age YA BMD Significant CHANGE T-score DualFemur Neck Left  7/01/2023    61.5         -1.4    0.838 g/cm2 DualFemur Neck Left  09/05/2019    57.8         -1.2    0.868 g/cm2 AP Spine  L1-L4  06/01/2023    61.5         -1.1    1.050 g/cm2 DualFemur Total Mean 06/01/2023 61.5 -0.9 0.899 g/cm2 * DualFemur Total Mean 09/05/2019    57.8         -0.6    0.938 g/cm2 World Health Organization [facility]) criteria for post-menopausal, Caucasian Women: Normal       T-score at or above -1 SD Osteopenia   T-score between -1 and -2.5 SD Osteoporosis T-score at or below -2.5 SD RECOMMENDATION: 1. All patients should optimize calcium and vitamin D intake. 2. Consider FDA-approved medical therapies in postmenopausal women and men aged 46 years and older, based on the following: a. A hip or vertebral (clinical or morphometric) fracture. b. T-score = -2.5 at the femoral neck or spine after appropriate evaluation to exclude secondary causes. c. Low bone mass (T-score between -1.0 and -2.5 at the femoral neck or spine) and a 10-year probability of a hip fracture = 3% or a 10-year probability of a major osteoporosis-related fracture = 20% based on the [place]-adapted WHO algorithm. d. Clinician judgment and/or patient preferences may indicate treatment for people with 10-year fracture probabilities above or below these levels. FOLLOW-UP: Patients with diagnosis of osteoporosis or at high risk for fracture should have regular bone mineral density tests.? Patients eligible for Medicare are allowed routine testing every 2 years.? The testing frequency can be increased to one year for patients who have rapidly progressing disease, are receiving or discontinuing medical therapy to restore bone mass, or have additional risk factors. I have reviewed this study and agree with the findings. [facility] FRAX* 10-year Probability of Fracture Based on femoral neck BMD: DualFemur (Left) Major Osteoporotic Fracture:  3.4% Hip Fracture:                0.3% Population:                  [place] (Black) Risk Factors:                None *FRAX is a Armed forces logistics/support/administrative officer of the [employer] of [employer] for Metabolic Bone Disease, a World Science writer (WHO) [employer]. ASSESSMENT: The probability of a major osteoporotic fracture is 3.4% within the next ten years. The probability of a hip fracture is 0.3% within the next ten years. Electronically Signed   By: Romona Curls M.D.   On: 06/01/2023 15:20    Recent Labs: Lab Results  Component Value Date   WBC 3.6 06/01/2023   HGB 12.0 06/01/2023   PLT 159 06/01/2023   NA 142 06/01/2023   K 4.4 06/01/2023   CL 104 06/01/2023   CO2 24 06/01/2023   GLUCOSE 81 06/01/2023   BUN 14 06/01/2023   CREATININE 0.88 06/01/2023   BILITOT 0.4 06/01/2023   ALKPHOS 77 06/01/2023   AST 26 06/01/2023   ALT 24 06/01/2023   PROT 6.5 06/01/2023   ALBUMIN 4.3 06/01/2023   CALCIUM 9.8 06/01/2023   GFRAA 70 11/19/2020    Speciality Comments: PLQ Eye Exam: 01/17/2023 OCT is entirely reassuring. There are scattered defects OU on the 10/2 field, but they do not correlate to the OCT or the exam. Dr. Dione Booze will simply trend the field over time. No suggestion of toxicity. Okay to continue PLQ.  Follow up 1 year   Procedures:  No procedures performed Allergies: Quinolones, Codone [hydrocodone], Garlic, Latex, Levaquin [levofloxacin], Losartan, Metoprolol, Onion, Penicillins, Percocet [oxycodone-acetaminophen], Sulfa drugs cross reactors, Vicodin [  hydrocodone-acetaminophen], and Gluten meal     Assessment / Plan:     Visit Diagnoses: Autoimmune disease (HCC) - - ANA 1:320 NO, dsDNA8, RF-,CCP-, History of nasal ulcers and oral ulcers in the past, positive synovitis in left hand MCPs on [place] previously:She has not had any signs or symptoms of a flare.  She has clinically been doing well taking plaquenil 200 mg 1 tablet by mouth twice daily Monday through Friday.  She  continues to tolerate plaquenil without any side effects and has not had any recent gaps in therapy.  No oral or nasal ulcers. No recent rashes. No symptoms of raynaud's phenomenon.   Lab work from 01/02/23 was reviewed today in the office: complements WNL, dsDNA 6, ESR 11, CK 104, and ANA negative. Labs were not consistent with active disease or a flare. The following lab work will be updated today for further evaluation. She will remain on plaquenil as prescribed. She will notify us if she develops signs or symptoms of a flare.  She will follow up in 5 months or sooner if needed.    - Plan: Protein / creatinine ratio, urine, Sedimentation rate, C3 and C4, Anti-DNA antibody, double-stranded  Sicca complex (HCC): Chronic eye dryness-manageable.    High risk medication use - Plaquenil 200 mg 1 tablet by mouth daily twice daily Monday through Friday only. CBC and CMP updated on 06/01/23.   PLQ Eye Exam: 01/17/2023 OCT is entirely reassuring. There are scattered defects OU on the 10/2 field, but they do not correlate to the OCT or the exam. Dr. Dione Booze will simply trend the field over time. No suggestion of toxicity. Okay to continue PLQ. Follow up 1 year.    Lateral epicondylitis, right elbow: Not currently symptomatic.   Primary osteoarthritis of both hands: No synovitis noted.   Primary osteoarthritis of both knees: She has good ROM of both knees with no warmth or effusion.   Trochanteric bursitis of both hips: She has tenderness over bilateral trochanteric bursa and along the IT band bilaterally.  A new order for PT will be placed today.   Myofascial muscle pain: Patient continues to experience intermittent myalgias and muscle tenderness due to myofascial pain syndrome.  She has been going to physical therapy 3 days a week which has been helping with the trapezius muscle tension and tenderness she has been experiencing.  She continues to have persistent pain in both hips consistent with trochanter  bursitis and IT band syndrome.  A new order for physical therapy will be placed.  Other medical conditions are listed as follows:  Family history of rheumatoid arthritis  Family history of lupus erythematosus  History of hypertension: Blood pressure was elevated today in the office and was rechecked prior to leaving.  Patient was advised to monitor blood pressure closely to reach out to PCP if her blood pressure remains elevated.  History of hyperlipidemia  History of anxiety  Vitamin D deficiency  History of gastroesophageal reflux (GERD)  Orders: Orders Placed This Encounter  Procedures   Protein / creatinine ratio, urine   Sedimentation rate   C3 and C4   Anti-DNA antibody, double-stranded   No orders of the defined types were placed in this encounter.   Follow-Up Instructions: No follow-ups on file.   Gearldine Bienenstock, PA-C  Note - This record has been created using Dragon software.  Chart creation errors have been sought, but may not always  have been located. Such creation errors do not reflect on  the standard of medical care.

## 2023-06-01 ENCOUNTER — Ambulatory Visit
Admission: RE | Admit: 2023-06-01 | Discharge: 2023-06-01 | Disposition: A | Discharge status: Home / Self Care | Payer: BC Managed Care – PPO | Source: Ambulatory Visit | Attending: Medical | Admitting: Medical

## 2023-06-01 ENCOUNTER — Encounter: Payer: Self-pay | Admitting: Medical

## 2023-06-01 ENCOUNTER — Ambulatory Visit: Payer: BC Managed Care – PPO | Admitting: Medical

## 2023-06-01 VITALS — BP 120/72 | HR 60 | Ht 64.0 in | Wt 128.0 lb

## 2023-06-01 DIAGNOSIS — F419 Anxiety disorder, unspecified: Secondary | ICD-10-CM

## 2023-06-01 DIAGNOSIS — M359 Systemic involvement of connective tissue, unspecified: Secondary | ICD-10-CM

## 2023-06-01 DIAGNOSIS — M2550 Pain in unspecified joint: Secondary | ICD-10-CM

## 2023-06-01 DIAGNOSIS — Z0000 Encounter for general adult medical examination without abnormal findings: Secondary | ICD-10-CM | POA: Diagnosis not present

## 2023-06-01 DIAGNOSIS — K5909 Other constipation: Secondary | ICD-10-CM

## 2023-06-01 DIAGNOSIS — K219 Gastro-esophageal reflux disease without esophagitis: Secondary | ICD-10-CM | POA: Diagnosis not present

## 2023-06-01 DIAGNOSIS — G894 Chronic pain syndrome: Secondary | ICD-10-CM

## 2023-06-01 DIAGNOSIS — M170 Bilateral primary osteoarthritis of knee: Secondary | ICD-10-CM

## 2023-06-01 DIAGNOSIS — Z7185 Encounter for immunization safety counseling: Secondary | ICD-10-CM

## 2023-06-01 DIAGNOSIS — Z23 Encounter for immunization: Secondary | ICD-10-CM | POA: Diagnosis not present

## 2023-06-01 DIAGNOSIS — R7303 Prediabetes: Secondary | ICD-10-CM

## 2023-06-01 DIAGNOSIS — G43709 Chronic migraine without aura, not intractable, without status migrainosus: Secondary | ICD-10-CM

## 2023-06-01 DIAGNOSIS — M7918 Myalgia, other site: Secondary | ICD-10-CM

## 2023-06-01 DIAGNOSIS — R202 Paresthesia of skin: Secondary | ICD-10-CM

## 2023-06-01 DIAGNOSIS — E785 Hyperlipidemia, unspecified: Secondary | ICD-10-CM | POA: Diagnosis not present

## 2023-06-01 DIAGNOSIS — M8580 Other specified disorders of bone density and structure, unspecified site: Secondary | ICD-10-CM

## 2023-06-01 DIAGNOSIS — M19042 Primary osteoarthritis, left hand: Secondary | ICD-10-CM

## 2023-06-01 DIAGNOSIS — I10 Essential (primary) hypertension: Secondary | ICD-10-CM

## 2023-06-01 NOTE — Patient Instructions (Signed)
This visit was a preventative care visit, also known as wellness visit or routine physical.   Topics typically include healthy lifestyle, diet, exercise, preventative care, vaccinations, sick and well care, proper use of emergency dept and after hours care, as well as other concerns.     Recommendations: Continue to return yearly for your annual wellness and preventative care visits.  This gives [place] a chance to discuss healthy lifestyle, exercise, vaccinations, review your chart record, and perform screenings where appropriate.  I recommend you see your eye doctor yearly for routine vision care.  I recommend you see your dentist yearly for routine dental care including hygiene visits twice yearly.   Vaccination recommendations were reviewed Immunization History  Administered Date(s) Administered   Influenza,inj,Quad PF,6+ Mos 05/01/2013, 03/31/2014, 05/17/2017, 05/21/2018, 03/29/2019, 04/10/2020, 06/11/2021, 05/17/2022   PFIZER Comirnaty(Gray Top)Covid-19 Tri-Sucrose Vaccine 10/08/2020   PFIZER(Purple Top)SARS-COV-2 Vaccination 08/24/2019, 09/17/2019, 03/30/2020   Pfizer(Comirnaty)Fall Seasonal Vaccine 12 years and older 07/11/2022   Td 02/04/2012   Tdap 06/01/2023   Zoster Recombinant(Shingrix) 09/15/2020, 12/08/2020   Counseled on the Tdap (tetanus, diptheria, and acellular pertussis) vaccine.  Vaccine information sheet given. Tdap vaccine given after consent obtained.  She notes having covid infection in June 2024.  She plans to come in for flu vaccine next week.     Screening for cancer: Colon cancer screening: I reviewed your colonoscopy on file that is up to date from 2022, repeat in 5 years, 2027  Breast cancer screening: You should perform a self breast exam monthly.   We reviewed recommendations for regular mammograms and breast cancer screening.  Cervical cancer screening: Pap normal 06/2021  Skin cancer screening: Check your skin regularly for new changes, growing  lesions, or other lesions of concern Come in for evaluation if you have skin lesions of concern.  Lung cancer screening: If you have a greater than 20 pack year history of tobacco use, then you may qualify for lung cancer screening with a chest CT scan.   Please call your insurance company to inquire about coverage for this test.  We currently don't have screenings for other cancers besides breast, cervical, colon, and lung cancers.  If you have a strong family history of cancer or have other cancer screening concerns, please let me know.    Bone health: Get at least 150 minutes of aerobic exercise weekly Get weight bearing exercise at least once weekly Bone density test:  A bone density test is an imaging test that uses a type of X-ray to measure the amount of calcium and other minerals in your bones. The test may be used to diagnose or screen you for a condition that causes weak or thin bones (osteoporosis), predict your risk for a broken bone (fracture), or determine how well your osteoporosis treatment is working. The bone density test is recommended for females 65 and older, or females or males <65 if certain risk factors such as thyroid disease, long term use of steroids such as for asthma or rheumatological issues, vitamin D deficiency, estrogen deficiency, family history of osteoporosis, self or family history of fragility fracture in first degree relative.   Follow up as planned for bone density test today 06/01/23   Heart health: Get at least 150 minutes of aerobic exercise weekly Limit alcohol It is important to maintain a healthy blood pressure and healthy cholesterol numbers  Heart disease screening: Screening for heart disease includes screening for blood pressure, fasting lipids, glucose/diabetes screening, BMI height to weight ratio, reviewed of smoking  status, physical activity, and diet.    Goals include blood pressure 120/80 or less, maintaining a healthy  lipid/cholesterol profile, preventing diabetes or keeping diabetes numbers under good control, not smoking or using tobacco products, exercising most days per week or at least 150 minutes per week of exercise, and eating healthy variety of fruits and vegetables, healthy oils, and avoiding unhealthy food choices like fried food, fast food, high sugar and high cholesterol foods.     Medical care options: I recommend you continue to seek care here first for routine care.  We try really hard to have available appointments Monday through Friday daytime hours for sick visits, acute visits, and physicals.  Urgent care should be used for after hours and weekends for significant issues that cannot wait till the next day.  The emergency department should be used for significant potentially life-threatening emergencies.  The emergency department is expensive, can often have long wait times for less significant concerns, so try to utilize primary care, urgent care, or telemedicine when possible to avoid unnecessary trips to the emergency department.  Virtual visits and telemedicine have been introduced since the pandemic started in 2020, and can be convenient ways to receive medical care.  We offer virtual appointments as well to assist you in a variety of options to seek medical care.   Advanced Directives: I recommend you consider completing a Health Care Power of Attorney and Living Will.   These documents respect your wishes and help alleviate burdens on your loved ones if you were to become terminally ill or be in a position to need those documents enforced.    You can complete Advanced Directives yourself, have them notarized, then have copies made for our office, for you and for anybody you feel should have them in safe keeping.  Or, you can have an attorney prepare these documents.   If you haven't updated your Last Will and Testament in a while, it may be worthwhile having an attorney prepare these  documents together and save on some costs.        Separate significant issues discussed: Prior impaired glucose-updated labs today  Hyperlipidemia-stopped statin 2 weeks ago to see if pain would improve.  So far no improvement.  If no change in the next 7 days then resume statin  I saw her recently for consult ongoing pain and concerns.  See that recent chart note from 05/16/2023  She will continue routine follow-up rheumatology for autoimmune disease

## 2023-06-01 NOTE — Progress Notes (Signed)
Subjective:   HPI  Shari Lawrence is a 62 y.o. female who presents for Chief Complaint  Patient presents with   Annual Exam    Fasting cpe, still having neck and head pain    Patient Care Team: Jozlin Bently, Cleda Mccreedy as PCP - General Charna Elizabeth, MD as Attending Physician (Gastroenterology) Pollyann Savoy, MD as Consulting Physician (Rheumatology) Mamie Nick, DC as Referring Physician (Chiropractic Medicine) Levert Feinstein, MD as Consulting Physician (Neurology) Ardell Isaacs, Forrestine Him, MD as Consulting Physician (Obstetrics and Gynecology) Gearldine Bienenstock, PA-C as Physician Assistant (Physician Assistant) Coletta Memos, MD as Consulting Physician (Neurosurgery) Tarry Kos, MD as Attending Physician (Orthopedic Surgery) Sees dentist Sees eye doctor   Concerns: Here for well visit  Exercise - going to PT 3 times weekly currently.  Walks a lot, tries to get 5000 steps .  Getting resistance exercise as well.  Working mostly full time hours, professor of biology  She was seen here 05/16/23 for chronic pain, pain neck, right arm and other issues.   Still dealing with pain, is seeing PT currently   Reviewed their medical, surgical, family, social, medication, and allergy history and updated chart as appropriate.  Past Medical History:  Diagnosis Date   Anemia 11/2010   hematology consult prior; etiology malabsorption and uterine bleeding   Anxiety    COVID-19 10/28/2020   Gastric polyp    GERD (gastroesophageal reflux disease)    H/O bone density study 12/2007   H/O hysterectomy for benign disease 10/2012   History of echocardiogram 03/2010   normal LV function, EF 60-65%, mild left atrial enlargement   HTN (hypertension) 03/2010   hospitalization for HTN urgency   Hyperlipemia    Internal hemorrhoids    Lumbar degenerative disc disease    Migraine    Myalgia    Numbness and tingling    Prediabetes    Spondylosis, cervical    Umbilical hernia     Uterine fibroid    hx/o     Family History  Problem Relation Age of Onset   Hypertension Mother    Dementia Mother    Colon cancer Mother 74   Deep vein thrombosis Father    Hypertension Father    Cancer Father        prostate CA   Heart disease Father 11       CAD   Parkinsonism Father    Hypertension Sister    Hyperlipidemia Sister    Sjogren's syndrome Sister    Hypertension Brother    Hyperlipidemia Brother    Hypertension Maternal Aunt    Hyperlipidemia Maternal Aunt    Rheum arthritis Maternal Aunt    Diabetes Maternal Aunt        1 with type II, 1 with type 1   Hypertension Brother    Rheum arthritis Brother    Diabetes Paternal Aunt        type II   Breast cancer Neg Hx      Current Outpatient Medications:    acetaminophen (TYLENOL) 500 MG tablet, Take 1 tablet (500 mg total) by mouth every 6 (six) hours as needed., Disp: 30 tablet, Rfl: 0   ALPRAZolam (XANAX) 0.5 MG tablet, Take 0.5 mg by mouth as needed for anxiety., Disp: , Rfl:    amLODipine (NORVASC) 5 MG tablet, TAKE 1 TABLET (5 MG TOTAL) BY MOUTH DAILY., Disp: 90 tablet, Rfl: 1   Ascorbic Acid (VITAMIN C) 1000 MG tablet, Take 1,000 mg  by mouth daily., Disp: , Rfl:    B Complex Vitamins (B COMPLEX PO), Take 1 tablet by mouth daily., Disp: , Rfl:    calcium carbonate (OS-CAL) 600 MG TABS, Take 600 mg by mouth daily., Disp: , Rfl:    CINNAMON PO, Take 1 tablet by mouth daily. , Disp: , Rfl:    diclofenac Sodium (VOLTAREN) 1 % GEL, Apply 2 g topically 4 (four) times daily., Disp: , Rfl:    esomeprazole (NEXIUM) 40 MG capsule, Take 40 mg by mouth every morning., Disp: , Rfl:    estradiol (VIVELLE-DOT) 0.025 MG/24HR, PLACE 1 PATCH ONTO THE SKIN 2 TIMES A WEEK., Disp: 24 patch, Rfl: 3   Estradiol (YUVAFEM) 10 MCG TABS vaginal tablet, PLACE ONE TABLET VAGINALLY TWICE WEEKLY AT BEDTIME, Disp: 24 tablet, Rfl: 3   fish oil-omega-3 fatty acids 1000 MG capsule, Take 1 g by mouth daily., Disp: , Rfl:    fluticasone  (FLONASE) 50 MCG/ACT nasal spray, Place 2 sprays into both nostrils daily., Disp: 16 g, Rfl: 2   Galcanezumab-gnlm (EMGALITY) 120 MG/ML SOAJ, Inject 240 mg into the skin every 30 (thirty) days., Disp: 2 mL, Rfl: 0   Galcanezumab-gnlm (EMGALITY) 120 MG/ML SOAJ, Inject 120 mg into the skin every 30 (thirty) days., Disp: 1.12 mL, Rfl: 11   guaiFENesin (MUCINEX) 600 MG 12 hr tablet, Take 1 tablet (600 mg total) by mouth 2 (two) times daily., Disp: 30 tablet, Rfl: 0   hydroxychloroquine (PLAQUENIL) 200 MG tablet, TAKE 1 TABLET BY MOUTH TWICE A DAY ON MONDAYS THROUGH FRIDAYS ONLY, Disp: 120 tablet, Rfl: 0   meloxicam (MOBIC) 7.5 MG tablet, TAKE 1 TABLET BY MOUTH DAILY AS NEEDED FOR PAIN, Disp: 30 tablet, Rfl: 3   Multiple Vitamin (MULTIVITAMIN) tablet, Take 1 tablet by mouth daily., Disp: , Rfl:    pantoprazole (PROTONIX) 40 MG tablet, Take 1 tablet (40 mg total) by mouth daily., Disp: 90 tablet, Rfl: 0   Probiotic Product (PROBIOTIC ADVANCED PO), Take 1 tablet by mouth daily. , Disp: , Rfl:    rizatriptan (MAXALT-MLT) 5 MG disintegrating tablet, May repeat in 2 hours if needed, Disp: 10 tablet, Rfl: 5   TURMERIC PO, Take 1 tablet by mouth daily. , Disp: , Rfl:    Ubrogepant (UBRELVY) 50 MG TABS, Take 1 tab at onset of migraine.  May repeat in 2 hrs, if needed.  Max dose: 2 tabs/day. This is a 30 day prescription., Disp: 10 tablet, Rfl: 6   atorvastatin (LIPITOR) 20 MG tablet, TAKE 1 TABLET BY MOUTH EVERY DAY (Patient not taking: Reported on 06/01/2023), Disp: 30 tablet, Rfl: 1   tiZANidine (ZANAFLEX) 4 MG tablet, Take 1 tablet (4 mg total) by mouth at bedtime as needed for muscle spasms. (Patient not taking: Reported on 06/01/2023), Disp: 30 tablet, Rfl: 3  Allergies  Allergen Reactions   Quinolones Anaphylaxis and Other (See Comments)    Whelps developed all over her body   Codone [Hydrocodone] Nausea And Vomiting    Patient states "she doesn't feel normal" when she takes this medication and that it  affects her mentally.   Garlic    Latex Itching and Other (See Comments)    Skin yellowing   Levaquin [Levofloxacin] Hives    Throat swelling   Losartan     Intolerance, fluctuating BPs   Metoprolol     Self reported hypotension   Onion    Penicillins    Percocet [Oxycodone-Acetaminophen]    Sulfa Drugs Cross Reactors Itching, Swelling  and Other (See Comments)    Red face   Vicodin [Hydrocodone-Acetaminophen] Itching and Nausea And Vomiting   Gluten Meal     Other reaction(s): Other (See Comments) IBS     Review of Systems  Constitutional:  Negative for chills, fever, malaise/fatigue and weight loss.  HENT:  Negative for congestion, ear pain, hearing loss, sore throat and tinnitus.   Eyes:  Negative for blurred vision, pain and redness.  Respiratory:  Negative for cough, hemoptysis and shortness of breath.   Cardiovascular:  Negative for chest pain, palpitations, orthopnea, claudication and leg swelling.  Gastrointestinal:  Negative for abdominal pain, blood in stool, constipation, diarrhea, nausea and vomiting.  Genitourinary:  Negative for dysuria, flank pain, frequency, hematuria and urgency.  Musculoskeletal:  Positive for back pain, joint pain, myalgias and neck pain. Negative for falls.  Skin:  Negative for itching and rash.  Neurological:  Negative for dizziness, tingling, speech change, weakness and headaches.  Endo/Heme/Allergies:  Negative for polydipsia. Does not bruise/bleed easily.  Psychiatric/Behavioral:  Negative for depression and memory loss. The patient is not nervous/anxious and does not have insomnia.         06/01/2023    8:39 AM 05/17/2022   12:10 PM 09/14/2021   11:03 AM 05/06/2021    3:30 PM 11/19/2020    9:13 AM  Depression screen PHQ 2/9  Decreased Interest 0 0 0 0 0  Down, Depressed, Hopeless 0 3 0 0 0  PHQ - 2 Score 0 3 0 0 0  Altered sleeping  3  1 0  Tired, decreased energy  0  0 0  Change in appetite  0  0 0  Feeling bad or failure  about yourself   0  0 0  Trouble concentrating  0  0 0  Moving slowly or fidgety/restless  0  0 0  Suicidal thoughts  0   0  PHQ-9 Score  6  1 0  Difficult doing work/chores    Not difficult at all        Objective:  BP 120/72   Pulse 60   Ht 5\' 4"  (1.626 m)   Wt 128 lb (58.1 kg)   LMP 10/25/2012 (Approximate)   BMI 21.97 kg/m   Wt Readings from Last 3 Encounters:  06/01/23 128 lb (58.1 kg)  05/16/23 127 lb 3.2 oz (57.7 kg)  04/10/23 126 lb (57.2 kg)   BP Readings from Last 3 Encounters:  06/01/23 120/72  05/16/23 124/64  04/10/23 118/74    General appearance: alert, no distress, WD/WN, African American female Skin: unremarkable HEENT: normocephalic, conjunctiva/corneas normal, sclerae anicteric, PERRLA, EOMi, nares patent, no discharge or erythema, pharynx normal Oral cavity: MMM, tongue normal, teeth in good repair Heart: RRR, normal S1, S2, no murmurs Lungs: CTA bilaterally, no wheezes, rhonchi, or rales Abdomen: +bs, soft, non tender, non distended, no masses, no hepatomegaly, no splenomegaly, no bruits Neck: supple, no lymphadenopathy, no thyromegaly, no masses Tender upper back in general on the right, no obvious spasm or nodule MSK: Right shoulder range of motion normal, no obvious deformity, no laxity, range of motion full, nontender to palpation.  Rest of arms unremarkable. Pulses: 2+ radial pulses, 2+ pedal pulses, normal cap refill Ext: no edema Normal arm strength and sensation. There is a small subcutaneous nodule less than 5 mm diameter in the distal third lateral left leg seemingly within the IT band.  Somewhat tender to palpation. Neurological: alert, oriented x 3, CN2-12 intact, strength normal upper extremities  and lower extremities, sensation normal throughout, DTRs 2+ throughout, no cerebellar signs, gait normal Psychiatric: normal affect, behavior normal, pleasant  Breast/gyn/rectal - deferred to gynecology    Assessment and Plan :   Encounter  Diagnoses  Name Primary?   Routine general medical examination at a health care facility Yes   Vaccine counseling    Prediabetes    Gastroesophageal reflux disease without esophagitis    Hyperlipidemia, unspecified hyperlipidemia type    Anxiety    Autoimmune disease (HCC)    Chronic constipation    Chronic migraine w/o aura, not intractable, w/o stat migr    Essential hypertension    Myofascial muscle pain    Paresthesia    Polyarthralgia    Primary osteoarthritis of both hands    Primary osteoarthritis of both knees    Chronic pain syndrome    Need for Tdap vaccination      This visit was a preventative care visit, also known as wellness visit or routine physical.   Topics typically include healthy lifestyle, diet, exercise, preventative care, vaccinations, sick and well care, proper use of emergency dept and after hours care, as well as other concerns.     Recommendations: Continue to return yearly for your annual wellness and preventative care visits.  This gives [place] a chance to discuss healthy lifestyle, exercise, vaccinations, review your chart record, and perform screenings where appropriate.  I recommend you see your eye doctor yearly for routine vision care.  I recommend you see your dentist yearly for routine dental care including hygiene visits twice yearly.   Vaccination recommendations were reviewed Immunization History  Administered Date(s) Administered   Influenza,inj,Quad PF,6+ Mos 05/01/2013, 03/31/2014, 05/17/2017, 05/21/2018, 03/29/2019, 04/10/2020, 06/11/2021, 05/17/2022   PFIZER Comirnaty(Gray Top)Covid-19 Tri-Sucrose Vaccine 10/08/2020   PFIZER(Purple Top)SARS-COV-2 Vaccination 08/24/2019, 09/17/2019, 03/30/2020   Pfizer(Comirnaty)Fall Seasonal Vaccine 12 years and older 07/11/2022   Td 02/04/2012   Tdap 06/01/2023   Zoster Recombinant(Shingrix) 09/15/2020, 12/08/2020   Counseled on the Tdap (tetanus, diptheria, and acellular pertussis) vaccine.   Vaccine information sheet given. Tdap vaccine given after consent obtained.  She notes having covid infection in June 2024.  She plans to come in for flu vaccine next week.     Screening for cancer: Colon cancer screening: I reviewed your colonoscopy on file that is up to date from 2022, repeat in 5 years, 2027  Breast cancer screening: You should perform a self breast exam monthly.   We reviewed recommendations for regular mammograms and breast cancer screening.  Cervical cancer screening: Pap normal 06/2021  Skin cancer screening: Check your skin regularly for new changes, growing lesions, or other lesions of concern Come in for evaluation if you have skin lesions of concern.  Lung cancer screening: If you have a greater than 20 pack year history of tobacco use, then you may qualify for lung cancer screening with a chest CT scan.   Please call your insurance company to inquire about coverage for this test.  We currently don't have screenings for other cancers besides breast, cervical, colon, and lung cancers.  If you have a strong family history of cancer or have other cancer screening concerns, please let me know.    Bone health: Get at least 150 minutes of aerobic exercise weekly Get weight bearing exercise at least once weekly Bone density test:  A bone density test is an imaging test that uses a type of X-ray to measure the amount of calcium and other minerals in your bones. The test  may be used to diagnose or screen you for a condition that causes weak or thin bones (osteoporosis), predict your risk for a broken bone (fracture), or determine how well your osteoporosis treatment is working. The bone density test is recommended for females 65 and older, or females or males <65 if certain risk factors such as thyroid disease, long term use of steroids such as for asthma or rheumatological issues, vitamin D deficiency, estrogen deficiency, family history of osteoporosis, self or  family history of fragility fracture in first degree relative.   Follow up as planned for bone density test today 06/01/23   Heart health: Get at least 150 minutes of aerobic exercise weekly Limit alcohol It is important to maintain a healthy blood pressure and healthy cholesterol numbers  Heart disease screening: Screening for heart disease includes screening for blood pressure, fasting lipids, glucose/diabetes screening, BMI height to weight ratio, reviewed of smoking status, physical activity, and diet.    Goals include blood pressure 120/80 or less, maintaining a healthy lipid/cholesterol profile, preventing diabetes or keeping diabetes numbers under good control, not smoking or using tobacco products, exercising most days per week or at least 150 minutes per week of exercise, and eating healthy variety of fruits and vegetables, healthy oils, and avoiding unhealthy food choices like fried food, fast food, high sugar and high cholesterol foods.     Medical care options: I recommend you continue to seek care here first for routine care.  We try really hard to have available appointments Monday through Friday daytime hours for sick visits, acute visits, and physicals.  Urgent care should be used for after hours and weekends for significant issues that cannot wait till the next day.  The emergency department should be used for significant potentially life-threatening emergencies.  The emergency department is expensive, can often have long wait times for less significant concerns, so try to utilize primary care, urgent care, or telemedicine when possible to avoid unnecessary trips to the emergency department.  Virtual visits and telemedicine have been introduced since the pandemic started in 2020, and can be convenient ways to receive medical care.  We offer virtual appointments as well to assist you in a variety of options to seek medical care.   Advanced Directives: I recommend you consider  completing a Health Care Power of Attorney and Living Will.   These documents respect your wishes and help alleviate burdens on your loved ones if you were to become terminally ill or be in a position to need those documents enforced.    You can complete Advanced Directives yourself, have them notarized, then have copies made for our office, for you and for anybody you feel should have them in safe keeping.  Or, you can have an attorney prepare these documents.   If you haven't updated your Last Will and Testament in a while, it may be worthwhile having an attorney prepare these documents together and save on some costs.        Separate significant issues discussed: Prior impaired glucose-updated labs today  Hyperlipidemia-stopped statin 2 weeks ago to see if pain would improve.  So far no improvement.  If no change in the next 7 days then resume statin  I saw her recently for consult ongoing pain and concerns.  See that recent chart note from 05/16/2023  She will continue routine follow-up rheumatology for autoimmune disease    [place] was seen today for annual exam.  Diagnoses and all orders for this visit:  Routine  general medical examination at a health care facility -     Hemoglobin A1c -     Lipid panel -     TSH -     CBC -     Comprehensive metabolic panel  Vaccine counseling  Prediabetes -     Hemoglobin A1c  Gastroesophageal reflux disease without esophagitis  Hyperlipidemia, unspecified hyperlipidemia type -     Lipid panel -     Comprehensive metabolic panel  Anxiety  Autoimmune disease (HCC) -     TSH  Chronic constipation  Chronic migraine w/o aura, not intractable, w/o stat migr  Essential hypertension  Myofascial muscle pain  Paresthesia  Polyarthralgia  Primary osteoarthritis of both hands  Primary osteoarthritis of both knees  Chronic pain syndrome -     TSH  Need for Tdap vaccination -     Tdap vaccine greater than or equal to 7yo  IM   Follow-up pending labs, yearly for physical

## 2023-06-02 LAB — COMPREHENSIVE METABOLIC PANEL
ALT: 24 [IU]/L (ref 0–32)
AST: 26 [IU]/L (ref 0–40)
Albumin: 4.3 g/dL (ref 3.9–4.9)
Alkaline Phosphatase: 77 [IU]/L (ref 44–121)
BUN/Creatinine Ratio: 16 (ref 12–28)
BUN: 14 mg/dL (ref 8–27)
Bilirubin Total: 0.4 mg/dL (ref 0.0–1.2)
CO2: 24 mmol/L (ref 20–29)
Calcium: 9.8 mg/dL (ref 8.7–10.3)
Chloride: 104 mmol/L (ref 96–106)
Creatinine, Ser: 0.88 mg/dL (ref 0.57–1.00)
Globulin, Total: 2.2 g/dL (ref 1.5–4.5)
Glucose: 81 mg/dL (ref 70–99)
Potassium: 4.4 mmol/L (ref 3.5–5.2)
Sodium: 142 mmol/L (ref 134–144)
Total Protein: 6.5 g/dL (ref 6.0–8.5)
eGFR: 75 mL/min/{1.73_m2} (ref >59)

## 2023-06-02 LAB — CBC
Hematocrit: 36.9 % (ref 34.0–46.6)
Hemoglobin: 12 g/dL (ref 11.1–15.9)
MCH: 29.5 pg (ref 26.6–33.0)
MCHC: 32.5 g/dL (ref 31.5–35.7)
MCV: 91 fL (ref 79–97)
Platelets: 159 10*3/uL (ref 150–450)
RBC: 4.07 x10E6/uL (ref 3.77–5.28)
RDW: 13.4 % (ref 11.7–15.4)
WBC: 3.6 10*3/uL (ref 3.4–10.8)

## 2023-06-02 LAB — LIPID PANEL
Chol/HDL Ratio: 3.1 {ratio} (ref 0.0–4.4)
Cholesterol, Total: 257 mg/dL — ABNORMAL HIGH (ref 100–199)
HDL: 83 mg/dL (ref >39)
LDL Chol Calc (NIH): 159 mg/dL — ABNORMAL HIGH (ref 0–99)
Triglycerides: 91 mg/dL (ref 0–149)
VLDL Cholesterol Cal: 15 mg/dL (ref 5–40)

## 2023-06-02 LAB — HEMOGLOBIN A1C
Est. average glucose Bld gHb Est-mCnc: 131 mg/dL
Hgb A1c MFr Bld: 6.2 % — ABNORMAL HIGH (ref 4.8–5.6)

## 2023-06-02 LAB — TSH: TSH: 1.6 u[IU]/mL (ref 0.450–4.500)

## 2023-06-02 NOTE — Progress Notes (Signed)
Results sent through MyChart

## 2023-06-05 ENCOUNTER — Ambulatory Visit: Payer: BC Managed Care – PPO | Attending: Physician Assistant | Admitting: Physician Assistant

## 2023-06-05 ENCOUNTER — Encounter: Payer: Self-pay | Admitting: Physician Assistant

## 2023-06-05 VITALS — BP 150/89 | HR 51 | Resp 14 | Ht 64.0 in | Wt 127.8 lb

## 2023-06-05 DIAGNOSIS — M359 Systemic involvement of connective tissue, unspecified: Secondary | ICD-10-CM | POA: Diagnosis not present

## 2023-06-05 DIAGNOSIS — M7062 Trochanteric bursitis, left hip: Secondary | ICD-10-CM

## 2023-06-05 DIAGNOSIS — Z8639 Personal history of other endocrine, nutritional and metabolic disease: Secondary | ICD-10-CM

## 2023-06-05 DIAGNOSIS — M7711 Lateral epicondylitis, right elbow: Secondary | ICD-10-CM | POA: Diagnosis not present

## 2023-06-05 DIAGNOSIS — Z8719 Personal history of other diseases of the digestive system: Secondary | ICD-10-CM

## 2023-06-05 DIAGNOSIS — M7918 Myalgia, other site: Secondary | ICD-10-CM

## 2023-06-05 DIAGNOSIS — Z79899 Other long term (current) drug therapy: Secondary | ICD-10-CM | POA: Diagnosis not present

## 2023-06-05 DIAGNOSIS — Z840 Family history of diseases of the skin and subcutaneous tissue: Secondary | ICD-10-CM

## 2023-06-05 DIAGNOSIS — Z8679 Personal history of other diseases of the circulatory system: Secondary | ICD-10-CM

## 2023-06-05 DIAGNOSIS — Z8659 Personal history of other mental and behavioral disorders: Secondary | ICD-10-CM

## 2023-06-05 DIAGNOSIS — M3500 Sicca syndrome, unspecified: Secondary | ICD-10-CM | POA: Diagnosis not present

## 2023-06-05 DIAGNOSIS — E559 Vitamin D deficiency, unspecified: Secondary | ICD-10-CM

## 2023-06-05 DIAGNOSIS — M7061 Trochanteric bursitis, right hip: Secondary | ICD-10-CM

## 2023-06-05 DIAGNOSIS — M19041 Primary osteoarthritis, right hand: Secondary | ICD-10-CM

## 2023-06-05 DIAGNOSIS — Z8261 Family history of arthritis: Secondary | ICD-10-CM

## 2023-06-05 DIAGNOSIS — M170 Bilateral primary osteoarthritis of knee: Secondary | ICD-10-CM

## 2023-06-05 DIAGNOSIS — M19042 Primary osteoarthritis, left hand: Secondary | ICD-10-CM

## 2023-06-05 NOTE — Addendum Note (Signed)
Addended by: Ellen Henri on: 06/05/2023 04:27 PM   Modules accepted: Orders

## 2023-06-06 NOTE — Addendum Note (Signed)
Addended by: Geroge Baseman C on: 06/06/2023 11:50 AM   Modules accepted: Orders

## 2023-06-06 NOTE — Progress Notes (Signed)
ESR WNL  Protein creatinine ratio WNL  Complements WNL

## 2023-06-07 LAB — PROTEIN / CREATININE RATIO, URINE
Creatinine, Urine: 171 mg/dL (ref 20–275)
Protein/Creat Ratio: 70 mg/g{creat} (ref 24–184)
Protein/Creatinine Ratio: 0.07 mg/mg{creat} (ref 0.024–0.184)
Total Protein, Urine: 12 mg/dL (ref 5–24)

## 2023-06-07 LAB — C3 AND C4
C3 Complement: 129 mg/dL (ref 83–193)
C4 Complement: 23 mg/dL (ref 15–57)

## 2023-06-07 LAB — ANTI-DNA ANTIBODY, DOUBLE-STRANDED: ds DNA Ab: 6 [IU]/mL — ABNORMAL HIGH

## 2023-06-07 LAB — SEDIMENTATION RATE: Sed Rate: 19 mm/h (ref 0–30)

## 2023-06-08 NOTE — Progress Notes (Signed)
dsDNA remains in indeterminate range-6-stable. Labs are not consistent with a flare.   No change in therapy recommended at this time

## 2023-06-17 ENCOUNTER — Other Ambulatory Visit: Payer: Self-pay | Admitting: Physician Assistant

## 2023-06-17 DIAGNOSIS — M359 Systemic involvement of connective tissue, unspecified: Secondary | ICD-10-CM

## 2023-06-22 ENCOUNTER — Other Ambulatory Visit: Payer: Self-pay | Admitting: Medical

## 2023-06-26 ENCOUNTER — Telehealth: Payer: Self-pay | Admitting: Neurology

## 2023-06-26 DIAGNOSIS — M7918 Myalgia, other site: Secondary | ICD-10-CM

## 2023-06-26 DIAGNOSIS — M542 Cervicalgia: Secondary | ICD-10-CM

## 2023-06-26 DIAGNOSIS — G43709 Chronic migraine without aura, not intractable, without status migrainosus: Secondary | ICD-10-CM

## 2023-06-26 NOTE — Telephone Encounter (Signed)
Returned call to pt and clarified the pain is orginating from right back side of head and into right ear. Physical therapy for headaches. Pt reported takes tylenol and ibuprofen and gets some relief but needs a better solution for managing the pain. Pt reported constant tension and pain in back of head and the intensity gets worse at times. Pt also stated she has had a nerve block in the past and it seemed to help. She would like another nerve block and a preventative if possible. She hasn't taken emgality. I encouraged her to pick up the emgality at the pharmacy and educated pt on loading dose. She stated she is scare of shots but her brother is a pharmd and will adminster. Routing to sarah slack np

## 2023-06-26 NOTE — Telephone Encounter (Signed)
Pt stated, continue to struggle with headaches in the back of head, can feel the pain in my ear. Have been taking physical therapy, and taking Ibuprofen , Tylenol; get some relief at times. Would like a call from the nurse on what to do.

## 2023-06-27 NOTE — Telephone Encounter (Signed)
Would suggest she try Emgality as discussed during October office visit. Was prescribed 2.5 months ago. I can see that it was approved by her insurance company. Thanks

## 2023-07-20 NOTE — Addendum Note (Signed)
Addended by: Levert Feinstein on: 07/20/2023 05:31 PM   Modules accepted: Orders

## 2023-07-20 NOTE — Telephone Encounter (Signed)
Orders Placed This Encounter  Procedures   Ambulatory referral to Pain Clinic     Referred her to spine pain management at [facility]    Dr. Aileen Fass, MD

## 2023-07-24 ENCOUNTER — Telehealth: Payer: Self-pay | Admitting: Neurology

## 2023-07-24 NOTE — Telephone Encounter (Signed)
Referral for pain management fax to [facility]. Phone: [phone], Fax: [phone]

## 2023-08-03 ENCOUNTER — Telehealth: Payer: Self-pay

## 2023-08-03 ENCOUNTER — Other Ambulatory Visit (HOSPITAL_COMMUNITY): Payer: Self-pay

## 2023-08-03 NOTE — Telephone Encounter (Signed)
*  [facility]  Pharmacy Patient Advocate Encounter   Received notification from Fax that prior authorization for Emgality  120MG /ML auto-injectors (migraine)  is required/requested.   Insurance verification completed.   The patient is insured through [facility] .   Per test claim: PA required; PA submitted to above mentioned insurance via CoverMyMeds Key/confirmation #/[facility] Status is pending

## 2023-08-04 ENCOUNTER — Other Ambulatory Visit (HOSPITAL_COMMUNITY): Payer: Self-pay

## 2023-08-04 NOTE — Telephone Encounter (Signed)
 Pharmacy Patient Advocate Encounter  Received notification from [facility] that Prior Authorization for Emgality  120MG /ML auto-injectors  has been APPROVED from 08/03/2023 to 08/02/2024. Ran test claim, Copay is $35.00. This test claim was processed through [facility]- copay amounts may vary at other pharmacies due to pharmacy/plan contracts, or as the patient moves through the different stages of their insurance plan.     *[employer] , the mfr of EMGALITY  120 MG/ML PEN paid 12.00 toward your copay. $4888.00 out of $4900.00 in benefits remaining. this year.

## 2023-08-15 ENCOUNTER — Other Ambulatory Visit: Payer: Self-pay | Admitting: Neurology

## 2023-08-17 ENCOUNTER — Other Ambulatory Visit: Payer: Self-pay | Admitting: Physician Assistant

## 2023-08-17 DIAGNOSIS — M359 Systemic involvement of connective tissue, unspecified: Secondary | ICD-10-CM

## 2023-08-18 NOTE — Telephone Encounter (Signed)
Last Fill: 05/09/2023  Eye exam:  01/17/2023 OCT is entirely reassuring. There are scattered defects OU on the 10/2 field, but they do not correlate to the OCT or the exam. Dr. Dione Booze will simply trend the field over time. No suggestion of toxicity. Okay to continue PLQ.    Labs: 06/01/2023 CBC/CMP WNL  Next Visit: 11/07/2023  Last Visit: 06/05/2023  DX:Autoimmune disease   Current Dose per office note 06/05/2023: Plaquenil 200 mg 1 tablet by mouth daily twice daily Monday through Friday only.   Okay to refill Plaquenil?

## 2023-08-28 ENCOUNTER — Other Ambulatory Visit: Payer: Self-pay

## 2023-08-28 DIAGNOSIS — Z5181 Encounter for therapeutic drug level monitoring: Secondary | ICD-10-CM

## 2023-08-28 NOTE — Telephone Encounter (Signed)
 Medication refill request: estradiol patch  Last AEX:  08/02/22  Next AEX: 09/26/23  Last MMG (if hormonal medication request): 02/09/23 Bi-rads 1 neg  Refill authorized: please advise

## 2023-08-28 NOTE — Telephone Encounter (Signed)
 Pt LVM in triage line requesting refill on estrogen patches.

## 2023-08-29 ENCOUNTER — Ambulatory Visit: Payer: Self-pay | Admitting: Obstetrics and Gynecology

## 2023-08-29 MED ORDER — ESTRADIOL 0.025 MG/24HR TD PTTW
MEDICATED_PATCH | TRANSDERMAL | 3 refills | Status: DC
Start: 2023-08-29 — End: 2023-09-26

## 2023-09-26 ENCOUNTER — Encounter: Payer: Self-pay | Admitting: Radiology

## 2023-09-26 ENCOUNTER — Ambulatory Visit: Payer: Self-pay | Admitting: Radiology

## 2023-09-26 ENCOUNTER — Ambulatory Visit (INDEPENDENT_AMBULATORY_CARE_PROVIDER_SITE_OTHER): Payer: Self-pay | Admitting: Radiology

## 2023-09-26 VITALS — BP 120/76 | HR 70 | Ht 63.5 in | Wt 125.4 lb

## 2023-09-26 DIAGNOSIS — Z1331 Encounter for screening for depression: Secondary | ICD-10-CM | POA: Diagnosis not present

## 2023-09-26 DIAGNOSIS — Z01419 Encounter for gynecological examination (general) (routine) without abnormal findings: Secondary | ICD-10-CM

## 2023-09-26 DIAGNOSIS — Z79890 Hormone replacement therapy (postmenopausal): Secondary | ICD-10-CM | POA: Diagnosis not present

## 2023-09-26 DIAGNOSIS — Z5181 Encounter for therapeutic drug level monitoring: Secondary | ICD-10-CM

## 2023-09-26 DIAGNOSIS — N952 Postmenopausal atrophic vaginitis: Secondary | ICD-10-CM | POA: Diagnosis not present

## 2023-09-26 MED ORDER — ESTRADIOL 10 MCG VA TABS: ORAL_TABLET | VAGINAL | 4 refills | Status: AC

## 2023-09-26 MED ORDER — ESTRADIOL 0.05 MG/24HR TD PTTW: 1.0000 | MEDICATED_PATCH | TRANSDERMAL | 4 refills | Status: AC

## 2023-09-26 NOTE — Progress Notes (Signed)
   Shari Lawrence 03-14-1961 [id number]   History:  63 y.o. G1P1 presents for annual exam. Would like to go back up in dose with her estrogen patch to help her symptoms and ideally prevent dementia (mother had it). Diagnosed with osteopenia 12/24. C/o joint pain. On plaquenil, managed by rheum. Forgets second dose of vagifem often. No other new gyn concerns.   Gynecologic History Hysterectomy: fibroids   Health Maintenance Last Pap: 2023. Results were: normal, HPV neg Last mammogram: 8/24 Last colonoscopy: 2022 DEXA: 06/01/2023 osteopenia.     09/26/2023    8:45 AM 06/01/2023    8:39 AM 05/17/2022   12:10 PM  Depression screen PHQ 2/9  Decreased Interest 1 0 0  Down, Depressed, Hopeless 1 0 3  PHQ - 2 Score 2 0 3  Altered sleeping 0  3  Tired, decreased energy 0  0  Change in appetite 0  0  Feeling bad or failure about yourself  0  0  Trouble concentrating 0  0  Moving slowly or fidgety/restless 0  0  Suicidal thoughts 0  0  PHQ-9 Score 2  6  Difficult doing work/chores Not difficult at all       Past medical history, past surgical history, family history and social history were all reviewed and documented in the EPIC chart.  ROS:  A ROS was performed and pertinent positives and negatives are included.  Exam:  Vitals:   09/26/23 0843  BP: 120/76  Pulse: 70  SpO2: 96%  Weight: 125 lb 6.4 oz (56.9 kg)  Height: 5' 3.5" (1.613 m)   Body mass index is 21.87 kg/m.  General appearance:  Normal Thyroid:  Symmetrical, normal in size, without palpable masses or nodularity. Respiratory  Auscultation:  Clear without wheezing or rhonchi Cardiovascular  Auscultation:  Regular rate, without rubs, murmurs or gallops  Edema/varicosities:  Not grossly evident Abdominal  Soft,nontender, without masses, guarding or rebound.  Liver/spleen:  No organomegaly noted  Hernia:  None appreciated  Skin  Inspection:  Grossly normal Breasts: Examined lying and  sitting.   Right: Without masses, retractions, nipple discharge or axillary adenopathy.   Left: Without masses, retractions, nipple discharge or axillary adenopathy. Genitourinary   Inguinal/mons:  Normal without inguinal adenopathy  External genitalia:  Normal appearing vulva with no masses, tenderness, or lesions  BUS/Urethra/Skene's glands:  Normal  Vagina:  Normal appearing with normal color and discharge, no lesions. Atrophy mild  Cervix:  absent  Uterus:  absent  Adnexa/parametria:     Rt: Normal in size, without masses or tenderness.   Lt: Normal in size, without masses or tenderness.  Anus and perineum: Normal   Raynelle Fanning, CMA present for exam  Assessment/Plan:   1. Well woman exam with routine gynecological exam (Primary) Pap 2028  2. Encounter for monitoring postmenopausal estrogen replacement therapy Would like to go back up with her dose to help with symptoms. Discussed risks and benefits. - estradiol (VIVELLE-DOT) 0.05 MG/24HR patch; Place 1 patch (0.05 mg total) onto the skin 2 (two) times a week.  Dispense: 24 patch; Refill: 4  3. Vaginal atrophy Use on patch change days to help remember - Estradiol (YUVAFEM) 10 MCG TABS vaginal tablet; PLACE ONE TABLET VAGINALLY TWICE WEEKLY AT BEDTIME  Dispense: 24 tablet; Refill: 4   Return in 1 year for annual or sooner prn.  Arlie Solomons B WHNP-BC 9:01 AM 09/26/2023

## 2023-09-26 NOTE — Patient Instructions (Signed)
 Preventive Care 16-62 Years Old, Female  Preventive care refers to lifestyle choices and visits with your health care provider that can promote health and wellness. Preventive care visits are also called wellness exams.  What can I expect for my preventive care visit?  Counseling  Your health care provider may ask you questions about your:  Medical history, including:  Past medical problems.  Family medical history.  Pregnancy history.  Current health, including:  Menstrual cycle.  Method of birth control.  Emotional well-being.  Home life and relationship well-being.  Sexual activity and sexual health.  Lifestyle, including:  Alcohol, nicotine or tobacco, and drug use.  Access to firearms.  Diet, exercise, and sleep habits.  Work and work Astronomer.  Sunscreen use.  Safety issues such as seatbelt and bike helmet use.  Physical exam  Your health care provider will check your:  Height and weight. These may be used to calculate your BMI (body mass index). BMI is a measurement that tells if you are at a healthy weight.  Waist circumference. This measures the distance around your waistline. This measurement also tells if you are at a healthy weight and may help predict your risk of certain diseases, such as type 2 diabetes and high blood pressure.  Heart rate and blood pressure.  Body temperature.  Skin for abnormal spots.  What immunizations do I need?    Vaccines are usually given at various ages, according to a schedule. Your health care provider will recommend vaccines for you based on your age, medical history, and lifestyle or other factors, such as travel or where you work.  What tests do I need?  Screening  Your health care provider may recommend screening tests for certain conditions. This may include:  Lipid and cholesterol levels.  Diabetes screening. This is done by checking your blood sugar (glucose) after you have not eaten for a while (fasting).  Pelvic exam and Pap test.  Hepatitis B test.  Hepatitis C  test.  HIV (human immunodeficiency virus) test.  STI (sexually transmitted infection) testing, if you are at risk.  Lung cancer screening.  Colorectal cancer screening.  Mammogram. Talk with your health care provider about when you should start having regular mammograms. This may depend on whether you have a family history of breast cancer.  BRCA-related cancer screening. This may be done if you have a family history of breast, ovarian, tubal, or peritoneal cancers.  Bone density scan. This is done to screen for osteoporosis.  Talk with your health care provider about your test results, treatment options, and if necessary, the need for more tests.  Follow these instructions at home:  Eating and drinking    Eat a diet that includes fresh fruits and vegetables, whole grains, lean protein, and low-fat dairy products.  Take vitamin and mineral supplements as recommended by your health care provider.  Do not drink alcohol if:  Your health care provider tells you not to drink.  You are pregnant, may be pregnant, or are planning to become pregnant.  If you drink alcohol:  Limit how much you have to 0-1 drink a day.  Know how much alcohol is in your drink. In the [place] one drink equals one 12 oz bottle of beer (355 mL), one 5 oz glass of wine (148 mL), or one 1 oz glass of hard liquor (44 mL).  Lifestyle  Brush your teeth every morning and night with fluoride toothpaste. Floss one time each day.  Exercise for at least  30 minutes 5 or more days each week.  Do not use any products that contain nicotine or tobacco. These products include cigarettes, chewing tobacco, and vaping devices, such as e-cigarettes. If you need help quitting, ask your health care provider.  Do not use drugs.  If you are sexually active, practice safe sex. Use a condom or other form of protection to prevent STIs.  If you do not wish to become pregnant, use a form of birth control. If you plan to become pregnant, see your health care provider for a  prepregnancy visit.  Take aspirin only as told by your health care provider. Make sure that you understand how much to take and what form to take. Work with your health care provider to find out whether it is safe and beneficial for you to take aspirin daily.  Find healthy ways to manage stress, such as:  Meditation, yoga, or listening to music.  Journaling.  Talking to a trusted person.  Spending time with friends and family.  Minimize exposure to UV radiation to reduce your risk of skin cancer.  Safety  Always wear your seat belt while driving or riding in a vehicle.  Do not drive:  If you have been drinking alcohol. Do not ride with someone who has been drinking.  When you are tired or distracted.  While texting.  If you have been using any mind-altering substances or drugs.  Wear a helmet and other protective equipment during sports activities.  If you have firearms in your house, make sure you follow all gun safety procedures.  Seek help if you have been physically or sexually abused.  What's next?  Visit your health care provider once a year for an annual wellness visit.  Ask your health care provider how often you should have your eyes and teeth checked.  Stay up to date on all vaccines.  This information is not intended to replace advice given to you by your health care provider. Make sure you discuss any questions you have with your health care provider.  Document Revised: 12/09/2020 Document Reviewed: 12/09/2020  Elsevier Patient Education  2024 [employer].

## 2023-10-02 ENCOUNTER — Encounter: Payer: Self-pay | Admitting: Medical

## 2023-10-02 ENCOUNTER — Ambulatory Visit: Admitting: Medical

## 2023-10-02 ENCOUNTER — Ambulatory Visit: Payer: Self-pay

## 2023-10-02 VITALS — BP 130/80 | HR 65 | Temp 98.9°F | Wt 129.0 lb

## 2023-10-02 DIAGNOSIS — J101 Influenza due to other identified influenza virus with other respiratory manifestations: Secondary | ICD-10-CM | POA: Diagnosis not present

## 2023-10-02 LAB — POCT INFLUENZA A/B
Influenza A, POC: POSITIVE — AB
Influenza B, POC: NEGATIVE

## 2023-10-02 MED ORDER — OSELTAMIVIR PHOSPHATE 75 MG PO CAPS: 75.0000 mg | ORAL_CAPSULE | Freq: Two times a day (BID) | ORAL | 0 refills | Status: DC

## 2023-10-02 NOTE — Progress Notes (Signed)
 Subjective:  Shari Lawrence is a 63 y.o. female who presents for Chief Complaint  Patient presents with   flu like symptoms    Flu like symptoms, ear pain, congestion, body aches, headaches, chills. Symptoms started on Saturday.      Here for flu like symptoms.   Started 2 days ago,but has headache, chills, pressure behind eye, ear pain.  No fever.  Chills and body aches.   No NVD.  Feels fatigue, but no SOB or wheezing.  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  Using some Flonase, benadryl, sudafed.  No other aggravating or relieving factors.    No other c/o.  Past Medical History:  Diagnosis Date   Anemia 11/2010   hematology consult prior; etiology malabsorption and uterine bleeding   Anxiety    COVID-19 10/28/2020   Gastric polyp    GERD (gastroesophageal reflux disease)    H/O bone density study 12/2007   H/O hysterectomy for benign disease 10/2012   History of echocardiogram 03/2010   normal LV function, EF 60-65%, mild left atrial enlargement   HTN (hypertension) 03/2010   hospitalization for HTN urgency   Hyperlipemia    Internal hemorrhoids    Lumbar degenerative disc disease    Migraine    Myalgia    Numbness and tingling    Prediabetes    Spondylosis, cervical    Umbilical hernia    Uterine fibroid    hx/o    Current Outpatient Medications on File Prior to Visit  Medication Sig Dispense Refill   acetaminophen (TYLENOL) 500 MG tablet Take 1 tablet (500 mg total) by mouth every 6 (six) hours as needed. 30 tablet 0   ALPRAZolam (XANAX) 0.5 MG tablet Take 0.5 mg by mouth as needed for anxiety.     amLODipine (NORVASC) 5 MG tablet TAKE 1 TABLET (5 MG TOTAL) BY MOUTH DAILY. 90 tablet 1   Ascorbic Acid (VITAMIN C) 1000 MG tablet Take 1,000 mg by mouth daily.     atorvastatin (LIPITOR) 20 MG tablet TAKE 1 TABLET BY MOUTH EVERY DAY 30 tablet 2   B Complex Vitamins (B COMPLEX PO) Take 1 tablet by mouth daily.     calcium carbonate (OS-CAL) 600 MG  TABS Take 600 mg by mouth daily.     CINNAMON PO Take 1 tablet by mouth daily.      diclofenac Sodium (VOLTAREN) 1 % GEL Apply 2 g topically 4 (four) times daily.     escitalopram (LEXAPRO) 10 MG tablet Take 5 mg by mouth daily.     esomeprazole (NEXIUM) 40 MG capsule Take 40 mg by mouth every morning.     estradiol (VIVELLE-DOT) 0.05 MG/24HR patch Place 1 patch (0.05 mg total) onto the skin 2 (two) times a week. 24 patch 4   Estradiol (YUVAFEM) 10 MCG TABS vaginal tablet PLACE ONE TABLET VAGINALLY TWICE WEEKLY AT BEDTIME 24 tablet 4   fish oil-omega-3 fatty acids 1000 MG capsule Take 1 g by mouth daily.     fluticasone (FLONASE) 50 MCG/ACT nasal spray Place 2 sprays into both nostrils daily. 16 g 2   hydroxychloroquine (PLAQUENIL) 200 MG tablet TAKE 1 TABLET BY MOUTH TWICE A DAY ON MONDAYS THROUGH FRIDAYS ONLY 120 tablet 0   meloxicam (MOBIC) 7.5 MG tablet TAKE 1 TABLET BY MOUTH DAILY AS NEEDED FOR PAIN 30 tablet 1   Multiple Vitamin (MULTIVITAMIN) tablet Take 1 tablet by mouth daily.     Probiotic Product (PROBIOTIC ADVANCED PO) Take  1 tablet by mouth daily.      TURMERIC PO Take 1 tablet by mouth daily.      pantoprazole (PROTONIX) 40 MG tablet Take 1 tablet (40 mg total) by mouth daily. (Patient not taking: Reported on 06/05/2023) 90 tablet 0   No current facility-administered medications on file prior to visit.     The following portions of the patient's history were reviewed and updated as appropriate: allergies, current medications, past family history, past medical history, past social history, past surgical history and problem list.  ROS Otherwise as in subjective above  Objective: BP 130/80   Pulse 65   Temp 98.9 F (37.2 C)   Wt 129 lb (58.5 kg)   LMP 10/25/2012 (Approximate)   SpO2 98%   BMI 22.49 kg/m   General appearance: alert, no distress, well developed, well nourished HEENT: normocephalic, sclerae anicteric, conjunctiva pink and moist, TMs bilat retracted but no  erythema, nares with mild erythema, turbinated edema, mild clear discharge, pharynx normal Oral cavity: MMM, no lesions Neck: supple, no lymphadenopathy, no thyromegaly, no masses Heart: RRR, normal S1, S2, no murmurs Lungs: CTA bilaterally, no wheezes, rhonchi, or rales   Assessment: Encounter Diagnosis  Name Primary?   Influenza A Yes     Plan: Prescription given for Tamiflu, discussed risks/benefits of medication.    Discussed diagnosis of influenza. Discussed supportive care including rest, hydration, OTC Tylenol or NSAID for fever, aches, and malaise.  Discussed period of contagion, self quarantine at home away from others to avoid spread of disease, discussed means of transmission, and possible complications including pneumonia.  If worse or not improving within the next 4-5 days, then call or return.  Patient voiced understanding of diagnosis, recommendations, and treatment plan.  After visit summary given.  Gave note for work.  Shari Lawrence was seen today for flu like symptoms.  Diagnoses and all orders for this visit:  Influenza A  Other orders -     oseltamivir (TAMIFLU) 75 MG capsule; Take 1 capsule (75 mg total) by mouth 2 (two) times daily.    Follow up: prn

## 2023-10-02 NOTE — Addendum Note (Signed)
 Addended by: Herminio Commons A on: 10/02/2023 03:46 PM   Modules accepted: Orders

## 2023-10-02 NOTE — Telephone Encounter (Signed)
  Chief Complaint: earache Symptoms: earache bilateral, congestion, PND Frequency: since saturday Pertinent Negatives: Patient denies sob,  Disposition: [] ED /[] Urgent Care (no appt availability in office) / [x] Appointment(In office/virtual)/ []  [place] [facility]/ [] Home Care/ [] Refused Recommended Disposition /[] [place] [facility]/ []  Follow-up with PCP Additional Notes: Patient reports she has been experiencing bilateral ear fullness, pain, congetion, and post-nasal drip since Saturday. Per protocol, appt scheduled next available tomorrow 10/03/23. Patient advised to call back with worsening symptoms. Patient verbalized understanding.    Copied from CRM [id number]. Topic: Clinical - Red Word Triage >> Oct 02, 2023  9:06 AM Alessandra Bevels wrote: Red Word that prompted transfer to Nurse Triage: Patient is calling to report congestion and stabbing pain in her left and right ear. With Drainage down her throat, no fever Reason for Disposition  Earache persists > 1 hour  Answer Assessment - Initial Assessment Questions 1. LOCATION: "Which ear is involved?"       Earache bilateral 2. SENSATION: "Describe how the ear feels." (e.g. stuffy, full, plugged)."      Full, clogged 3. ONSET:  "When did the ear symptoms start?"       saturday 4. PAIN: "Do you also have an earache?" If Yes, ask: "How bad is it?" (Scale 1-10; or mild, moderate, severe)     moderate 5. CAUSE: "What do you think is causing the ear congestion?"     allergies 6. URI: "Do you have a runny nose or cough?"      Runny nose 7. NASAL ALLERGIES: "Are there symptoms of hay fever, such as sneezing or a clear nasal discharge?"    yes  Protocols used: Ear - Congestion-A-AH

## 2023-10-02 NOTE — Patient Instructions (Addendum)
 Thank you for giving me the opportunity to serve you today.   Your diagnosis today includes: Encounter Diagnosis  Name Primary?   Influenza A Yes    Specific home care recommendations today include: Only take over-the-counter (OTC) or prescription medicines for pain, discomfort, or fever as directed by your caregiver.   Decongestant: You may use OTC Guaifenesin (Mucinex plain) for congestion.  You may use Pseudoephedrine (Sudafed) only if you don't have blood pressure problems or a diagnosis of hypertension. Cough suppression: If you have cough from drainage, you may use over-the-counter Dextromethorphan (Delsym) as directed on the label Sore throat remedies:  You may use salt water gargles, warm fluids such as coffee or hot tea, or honey/tea/lemon mixture to sooth sore throat pain.  You may use OTC sore throat remedies such as Cepacol lozenges or Chloraseptic spray for sore throat pain. Runny nose and sneezing remedies: You may use OTC antihistamine such as Zyrtec or Benadryl, but caution as these can cause drowsiness.   Pain/fever relief: You may use over-the-counter Tylenol for pain or fever Drink extra fluids. Fluids help thin the mucus so your sinuses can drain more easily.  Applying either moist heat or ice packs to the sinus areas may help relieve discomfort. Use saline nasal sprays to help moisten your sinuses. The sprays can be found at your local drugstore.    Influenza, Adult Influenza ("the flu") is a viral infection of the respiratory tract. It causes chills, fever, cough, headache, body aches, and sore throat. Influenza in general will make you feel sicker than when you have a common cold. Symptoms of the illness typically last a few days. Cough and fatigue may continue for as long as 7 to 10 days. Influenza is highly contagious. It spreads easily to others in the droplets from coughs and sneezes. People frequently become infected by touching something that was recently  contaminated with the virus and then touch their mouth, nose or eyes. This infection is caused by a virus. Symptoms will not be reduced or improved by taking an antibiotic. Antibiotics are medications that kill bacteria, not viruses. DIAGNOSIS  Diagnosis of influenza is often made based on the history and physical examination as well as the presence of influenza reports occurring in your community. Testing can be done if the diagnosis is not certain. TREATMENT  Since influenza is caused by a virus, antibiotics are not helpful. Your caregiver may prescribe antiviral medicines to shorten the illness and lessen the severity. Your caregiver may also recommend influenza vaccination and/or antiviral medicines for your family members in order to prevent the spread of influenza to them. HOME CARE INSTRUCTIONS DO NOT GIVE ASPIRIN TO PERSONS WITH INFLUENZA WHO ARE UNDER 45 YEARS OF AGE. This could lead to brain and liver damage (Reye's syndrome). Read the label on over-the-counter medicines.  Stay home from work or school if at all possible until most of your symptoms are gone.  Only take over-the-counter or prescription medicines for pain, discomfort, or fever as directed by your caregiver.  Use a cool mist humidifier to increase air moisture. This will make breathing easier.  Rest until your temperature is nearly normal: 98.6 F (37 C). This usually takes 3 to 4 days. Be sure you get plenty of rest.  Drink at least eight, eight-ounce glasses of fluids per day. Fluids include water, juice, broth, gelatin, or lemonade.  Cover your mouth and nose when coughing or sneezing and wash your hands often to prevent the spread of this virus  to other persons.  PREVENTION  Annual influenza vaccination (flu shots) is the best way to avoid getting influenza. An annual flu shot is now routinely recommended for all adults in the [place] SEEK MEDICAL CARE IF:  You develop shortness of breath while resting.  You have a deep  cough with production of mucous or chest pain.  You develop nausea (feeling sick to your stomach), vomiting, or diarrhea.  SEEK IMMEDIATE MEDICAL CARE IF:  You have difficulty breathing, become short of breath, or your skin or nails turn bluish.  You develop severe neck pain or stiffness.  You develop a severe headache, facial pain, or earache.  You have a fever.  You develop nausea or vomiting that cannot be controlled.  Document Released: 06/10/2000 Document Revised: 02/23/2011 Document Reviewed: 04/15/2009 [facility] Patient Information 2012 [place], [place].

## 2023-10-03 ENCOUNTER — Ambulatory Visit: Admitting: Medical

## 2023-10-10 ENCOUNTER — Ambulatory Visit: Payer: Self-pay | Admitting: *Deleted

## 2023-10-10 NOTE — Telephone Encounter (Signed)
  Chief Complaint: new bruising on arms, numbness- lower arms/hands Symptoms: bruising is new symptom- bilateral arms, patient state she also has numbness in lower amrs and hands- comes ans goes- hx carpel tunnel Frequency: numbness- started last week- bruising was noticed this morning Pertinent Negatives: Patient denies weakness, dizziness, pain, fever, nosebleed, blood in urine/stool Disposition: [] ED /[] Urgent Care (no appt availability in office) / [x] Appointment(In office/virtual)/ []  [place] [facility]/ [] Home Care/ [] Refused Recommended Disposition /[] [place] [facility]/ []  Follow-up with PCP Additional Notes: Patient has been scheduled for evaluation- she has been instructed to call back if she has any changes.    Copied from CRM [id number]. Topic: Clinical - Red Word Triage >> Oct 10, 2023  9:45 AM Everlene Hobby D wrote: Patient has bruises all over arms and don't know where they are coming from. She feels like her legs are numb it comes and goes. Says it's in the knee area and back and her arms felt like they were going to sleep like she needed to get circulation going in them. Reason for Disposition  [1] Not caused by an injury AND [2] < 5 unexplained bruises  Answer Assessment - Initial Assessment Questions 1. APPEARANCE of BRUISE: "Describe the bruise."      [place] color 2. SIZE: "How large is the bruise?"      2 inches long, 1 inch in diameter- circular/oval in shape 3. NUMBER: "How many bruises are there?"      4 areas 4. LOCATION: "Where is the bruise located?"      Both arms- near wrist and back of arm 5. ONSET: "How long ago did the bruise occur?"      This morning while dressing 6. CAUSE: "Tell me how it happened."     Circulation issues- loss of sensation on leg- waking with hands numb 7. MEDICAL HISTORY: "Do you have any medical problems that can cause easy bruising or bleeding?" (e.g., leukemia, liver disease, recent chemotherapy)     no 8. MEDICINES: "Do you take  any medications which thin the blood such as: aspirin, heparin, ibuprofen (NSAIDS), Plavix, or Coumadin?"     No- plaquenil  9. OTHER SYMPTOMS: "Do you have any other symptoms?"  (e.g., weakness, dizziness, pain, fever, nosebleed, blood in urine/stool)     Hx bruising on leg  Protocols used: Bruises-A-AH

## 2023-10-10 NOTE — Progress Notes (Unsigned)
 No chief complaint on file.  She saw Jimmye Moulds 4/7 and was diagnosed with influenza A, treated with Tamiflu  She called yesterday for appointment due to finding 4 bruises on her arms, without any known injury. She also has some intermittent numbness in her legs (knee area).  Sometimes wakes up with lower arms and hands numb. She has h/o carpal tunnel syndrome.   Mobic from Dr. Gracie Lav 07/2023   Last CBC normal in December  Lab Results  Component Value Date   WBC 3.6 06/01/2023   HGB 12.0 06/01/2023   HCT 36.9 06/01/2023   MCV 91 06/01/2023   PLT 159 06/01/2023     PMH, PSH, SH reviewed Autoimmune disease, on plaquenil GERD, bilateral carpal tunnel syndrome, HTN, HLD Migraines, occipital neuralgia, cervicalgia Sees neuro, rheum. Saw neurosurg in 07/2023 to discuss injections (R C7-T1, facet/medial branch block above level of prior fusion).   ROS:    PHYSICAL EXAM:  LMP 10/25/2012 (Approximate)   Wt Readings from Last 3 Encounters:  10/02/23 129 lb (58.5 kg)  09/26/23 125 lb 6.4 oz (56.9 kg)  06/05/23 127 lb 12.8 oz (58 kg)       ASSESSMENT/PLAN:   Taking meloxicam? Other NSAIDs (anything other than tylenol when sick with the flu, for body aches or fever)

## 2023-10-11 ENCOUNTER — Encounter: Payer: Self-pay | Admitting: Family Medicine

## 2023-10-11 ENCOUNTER — Ambulatory Visit: Admitting: Family Medicine

## 2023-10-11 VITALS — BP 110/72 | HR 52 | Temp 97.9°F | Wt 128.2 lb

## 2023-10-11 DIAGNOSIS — R200 Anesthesia of skin: Secondary | ICD-10-CM | POA: Diagnosis not present

## 2023-10-11 DIAGNOSIS — R233 Spontaneous ecchymoses: Secondary | ICD-10-CM | POA: Diagnosis not present

## 2023-10-11 LAB — CBC WITH DIFFERENTIAL/PLATELET
Basophils Absolute: 0 10*3/uL (ref 0.0–0.2)
Basos: 1 %
EOS (ABSOLUTE): 0.2 10*3/uL (ref 0.0–0.4)
Eos: 5 %
Hematocrit: 35.4 % (ref 34.0–46.6)
Hemoglobin: 11.8 g/dL (ref 11.1–15.9)
Immature Grans (Abs): 0 10*3/uL (ref 0.0–0.1)
Immature Granulocytes: 0 %
Lymphocytes Absolute: 2.3 10*3/uL (ref 0.7–3.1)
Lymphs: 44 %
MCH: 30.3 pg (ref 26.6–33.0)
MCHC: 33.3 g/dL (ref 31.5–35.7)
MCV: 91 fL (ref 79–97)
Monocytes Absolute: 0.5 10*3/uL (ref 0.1–0.9)
Monocytes: 10 %
Neutrophils Absolute: 2.1 10*3/uL (ref 1.4–7.0)
Neutrophils: 40 %
Platelets: 211 10*3/uL (ref 150–450)
RBC: 3.89 x10E6/uL (ref 3.77–5.28)
RDW: 13.2 % (ref 11.7–15.4)
WBC: 5.1 10*3/uL (ref 3.4–10.8)

## 2023-10-11 NOTE — Patient Instructions (Signed)
 Stop using aspirin.  Hopefully the bruising will stop. We are checking blood counts today to ensure no issues with platelets, anemia or other blood count issues.  I encourage you to resume using carpal tunnel braces for the numbness in your hands.  Since it mainly is bothering you at night, wear them to sleep. You can use them during the day if you are having symptoms related to any repetitive activity.

## 2023-10-12 ENCOUNTER — Encounter: Payer: Self-pay | Admitting: Family Medicine

## 2023-10-19 ENCOUNTER — Ambulatory Visit: Admitting: Medical

## 2023-10-19 ENCOUNTER — Ambulatory Visit: Payer: BC Managed Care – PPO | Admitting: Neurology

## 2023-10-22 ENCOUNTER — Other Ambulatory Visit: Payer: Self-pay | Admitting: Neurology

## 2023-10-22 ENCOUNTER — Other Ambulatory Visit: Payer: Self-pay | Admitting: Medical

## 2023-10-24 NOTE — Progress Notes (Unsigned)
 Office Visit Note  Patient: Shari Lawrence             Date of Birth: 08/15/1960           MRN: [id number]             PCP: Claudene Crystal, PA-C Referring: Claudene Crystal, PA-C Visit Date: 11/07/2023 Occupation: @GUAROCC @  Subjective:  Medication monitoring   History of Present Illness: [name] is a 63 y.o. female with history of autoimmune disease and osteoarthritis.  Patient is currently taking  Plaquenil  200 mg 1 tablet by mouth daily twice daily Monday through Friday only.  She has been tolerating Plaquenil  without any side effects and has not had any recent gaps in therapy.  She denies any signs or symptoms of an autoimmune disease flare recently.  Patient remains under the care of pain management and has been having injections performed to manage her neck pain which have been helpful.  She experiences intermittent discomfort in her lower back with some numbness and pain in the anterior left thigh at times.  She plans on following up with orthopedics for further evaluation.  Patient has been experiencing intermittent aching involving both thumbs.  She denies any joint swelling.   Activities of Daily Living:  Patient reports morning stiffness for 2-3 minutes.   Patient Reports nocturnal pain.  Difficulty dressing/grooming: Denies Difficulty climbing stairs: Reports Difficulty getting out of chair: Reports Difficulty using hands for taps, buttons, cutlery, and/or writing: Reports  Review of Systems  Constitutional:  Positive for fatigue.  HENT:  Positive for mouth dryness. Negative for mouth sores.   Eyes:  Positive for dryness.  Respiratory:  Negative for shortness of breath.   Cardiovascular:  Positive for palpitations. Negative for chest pain.  Gastrointestinal:  Negative for blood in stool, constipation and diarrhea.  Endocrine: Negative for increased urination.  Genitourinary:  Negative for involuntary urination.  Musculoskeletal:  Positive for  joint pain, joint pain, myalgias, morning stiffness, muscle tenderness and myalgias. Negative for gait problem, joint swelling and muscle weakness.  Skin:  Positive for hair loss and sensitivity to sunlight. Negative for color change and rash.  Allergic/Immunologic: Negative for susceptible to infections.  Neurological:  Positive for dizziness and headaches.  Hematological:  Negative for swollen glands.  Psychiatric/Behavioral:  Positive for depressed mood and sleep disturbance. The patient is nervous/anxious.     PMFS History:  Patient Active Problem List   Diagnosis Date Noted   Chronic pain syndrome 05/16/2023   Chronic migraine w/o aura, not intractable, w/o stat migr 10/11/2022   Neck pain 10/11/2022   Subcutaneous nodule of head 09/28/2022   Imbalance 04/29/2022   Scoliosis 09/14/2021   Chronic right-sided thoracic back pain 07/28/2020   Cervicalgia 07/28/2020   Prediabetes    Leg numbness 05/04/2020   Knee swelling 05/04/2020   History of recent stressful life event 11/20/2019   Panic attack 11/15/2019   Sleep disturbance 11/13/2019   Bilateral carpal tunnel syndrome 08/21/2019   Chronic pain of both knees 06/14/2019   Right arm pain 06/14/2019   Paresthesia 04/08/2019   Radicular pain in right arm 03/30/2019   Vaccine counseling 05/21/2018   Need for influenza vaccination 05/21/2018   Routine general medical examination at a health care facility 05/21/2018   Leg pain, diffuse, left 06/28/2017   Leg swelling 06/28/2017   Myofascial muscle pain 06/28/2017   High risk medication use 03/30/2017   Chronic constipation 11/17/2016   Globus sensation 11/17/2016  Primary osteoarthritis of both knees 10/05/2016   Autoimmune disease (HCC) 09/29/2016   Primary osteoarthritis of both hands 09/29/2016   Family history of lupus erythematosus 09/20/2016   Polyarthralgia 05/06/2016   Family history of rheumatoid arthritis 05/06/2016   Essential hypertension 12/23/2014   History  of anemia 12/23/2014   Hyperlipidemia 11/11/2012   Gluten intolerance 10/25/2011   Gastroesophageal reflux disease without esophagitis 10/25/2011   Anxiety    Iron deficiency anemia     Past Medical History:  Diagnosis Date   Anemia 11/2010   hematology consult prior; etiology malabsorption and uterine bleeding   Anxiety    COVID-19 10/28/2020   Gastric polyp    GERD (gastroesophageal reflux disease)    H/O bone density study 12/2007   H/O hysterectomy for benign disease 10/2012   History of echocardiogram 03/2010   normal LV function, EF 60-65%, mild left atrial enlargement   HTN (hypertension) 03/2010   hospitalization for HTN urgency   Hyperlipemia    Internal hemorrhoids    Lumbar degenerative disc disease    Migraine    Myalgia    Numbness and tingling    Prediabetes    Spondylosis, cervical    Umbilical hernia    Uterine fibroid    hx/o     Family History  Problem Relation Age of Onset   Hypertension Mother    Dementia Mother    Colon cancer Mother 85   Deep vein thrombosis Father    Hypertension Father    Cancer Father        prostate CA   Heart disease Father 58       CAD   Parkinsonism Father    Hypertension Sister    Hyperlipidemia Sister    Sjogren's syndrome Sister    Heart attack Sister    Hypertension Brother    Hyperlipidemia Brother    Hypertension Brother    Rheum arthritis Brother    Diabetes Brother    Hypertension Maternal Aunt    Hyperlipidemia Maternal Aunt    Rheum arthritis Maternal Aunt    Diabetes Maternal Aunt        1 with type II, 1 with type 1   Diabetes Paternal Aunt        type II   Breast cancer Neg Hx    Past Surgical History:  Procedure Laterality Date   ABDOMINAL HYSTERECTOMY     CERVICAL FUSION  2010   CERVICAL SPINE SURGERY     CERVIX LESION DESTRUCTION  1988   CESAREAN SECTION     CHOLECYSTECTOMY  2003   COLONOSCOPY  08/2010   Dr. Tova Fresh   COLONOSCOPY  02/2017   Dr. Tova Fresh   ESOPHAGOGASTRODUODENOSCOPY  08/2010    Dr. 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      Has 1 daughter.      Right-handed.   0.5 cup caffeine per day.      05/2024   Immunization History  Administered Date(s) Administered   Influenza,inj,Quad PF,6+ Mos 05/01/2013, 03/31/2014, 05/17/2017, 05/21/2018, 03/29/2019, 04/10/2020, 06/11/2021,  05/17/2022   PFIZER Comirnaty(Gray Top)Covid-19 Tri-Sucrose Vaccine 10/08/2020   PFIZER(Purple Top)SARS-COV-2 Vaccination 08/24/2019, 09/17/2019, 03/30/2020   Pfizer(Comirnaty)Fall Seasonal Vaccine 12 years and older 07/11/2022   Td 02/04/2012   Tdap 06/01/2023   Zoster Recombinant(Shingrix) 09/15/2020, 12/08/2020     Objective: Vital Signs: BP 131/85 (BP Location: Left Arm, Patient Position: Sitting, Cuff Size: Normal)   Pulse (!) 56   Resp 16   Ht 5\' 4"  (1.626 m)   Wt 128 lb 6.4 oz (58.2 kg)   LMP 10/25/2012 (Approximate)   BMI 22.04 kg/m    Physical Exam Vitals and nursing note reviewed.  Constitutional:      Appearance: She is well-developed.  HENT:     Head: Normocephalic and atraumatic.  Eyes:     Conjunctiva/sclera: Conjunctivae normal.  Cardiovascular:     Rate and Rhythm: Normal rate and regular rhythm.     Heart sounds: Normal heart sounds.  Pulmonary:     Effort: Pulmonary effort is normal.     Breath sounds: Normal breath sounds.  Abdominal:     General: Bowel sounds are normal.     Palpations: Abdomen is soft.  Musculoskeletal:     Cervical back: Normal range of motion.  Lymphadenopathy:     Cervical: No cervical adenopathy.  Skin:     General: Skin is warm and dry.     Capillary Refill: Capillary refill takes less than 2 seconds.  Neurological:     Mental Status: She is alert and oriented to person, place, and time.  Psychiatric:        Behavior: Behavior normal.      Musculoskeletal Exam: C-spine has limited range of motion with lateral rotation.  Trapezius muscle tension and tenderness bilaterally right greater than left.  Shoulder joints, elbow joints, wrist joints MCPs, PIPs, DIPs have good range of motion with no synovitis.  Some tenderness over the right CMC joint and first MCP joint.  Complete fist formation bilaterally.  Hip joints have good range of motion with no groin pain.  Knee joints have good range of motion with no warmth or effusion.  Ankle joints have good range of motion with no tenderness or joint swelling.  CDAI Exam: CDAI Score: -- Patient Global: --; Provider Global: -- Swollen: --; Tender: -- Joint Exam 11/07/2023   No joint exam has been documented for this visit   There is currently no information documented on the homunculus. Go to the Rheumatology activity and complete the homunculus joint exam.  Investigation: No additional findings.  Imaging: No results found.  Recent Labs: Lab Results  Component Value Date   WBC 5.1 10/11/2023   HGB 11.8 10/11/2023   PLT 211 10/11/2023   NA 142 06/01/2023   K 4.4 06/01/2023   CL 104 06/01/2023   CO2 24 06/01/2023   GLUCOSE 81 06/01/2023   BUN 14 06/01/2023   CREATININE 0.88 06/01/2023   BILITOT 0.4 06/01/2023   ALKPHOS 77 06/01/2023   AST 26 06/01/2023   ALT 24 06/01/2023   PROT 6.5 06/01/2023   ALBUMIN 4.3 06/01/2023   CALCIUM  9.8 06/01/2023   GFRAA 70 11/19/2020    Speciality Comments: PLQ Eye Exam: 01/17/2023 OCT is entirely reassuring. There are scattered defects OU on the 10/2 field, but they do not correlate to the OCT or the exam. Dr. Candi Chafe will simply trend the field over time. No suggestion of toxicity. Okay to continue PLQ.   Follow up 1 year   Procedures:  No procedures performed Allergies:  Quinolones, Codone [hydrocodone], Garlic, Latex, Levaquin [levofloxacin], Losartan , Metoprolol , Onion, Penicillins, Percocet [oxycodone-acetaminophen ], Sulfa drugs cross reactors, Vicodin [hydrocodone-acetaminophen ], and Gluten meal    Assessment / Plan:     Visit Diagnoses: Autoimmune disease (HCC) - ANA 1:320 NO, dsDNA8, RF-,CCP-, History of nasal ulcers and oral ulcers in the past, positive synovitis in left hand MCPs on US  previously: She is not exhibiting any signs or symptoms of an autoimmune disease flare.  She has clinically been doing well taking Plaquenil  200 mg 1 tablet by mouth twice daily Monday through Friday.  She is tolerating Plaquenil  without any side effects and has not had any interruptions in therapy.  She has no synovitis on examination today.  She has been experiencing a deep aching sensation involving both CMC joints intermittently but has no active inflammation.  Discussed the use of a CMC joint brace as well as Voltaren  gel which she can apply topically as needed for pain relief.  Patient continues to have chronic sicca symptoms.  She has not had any oral or nasal ulcerations.  No recent rashes. Lab work from 06/05/23: dsDNA 6, ESR WNL, complements WNL, and urine protein creatinine ratio WNL.  Double-stranded DNA has remained within the indeterminate range.  Plan to update the following lab work today.  She will remain on Plaquenil  as prescribed.  Refill sent to the pharmacy today.  She was advised to notify us  if she develops signs or symptoms of a flare. - Plan: Protein / creatinine ratio, urine, CBC with Differential/Platelet, ANA, Anti-DNA antibody, double-stranded, Sedimentation rate, C3 and C4, Comprehensive metabolic panel with GFR, hydroxychloroquine  (PLAQUENIL ) 200 MG tablet  Sicca complex [facility]): Patient continues to have chronic sicca symptoms.  High risk medication use - Plaquenil  200 mg 1 tablet by  mouth daily twice daily Monday through Friday only.  PLQ Eye Exam: 01/17/2023 OCT is entirely reassuring. There are scattered defects OU on the 10/2 field, but they do not correlate to the OCT or the exam. Dr. Candi Chafe will simply trend the field over time. No suggestion of toxicity. Okay to continue PLQ. Follow up 1 year.  Orders for CBC and CMP released today.  - Plan: CBC with Differential/Platelet, Comprehensive metabolic panel with GFR  Lateral epicondylitis, right elbow: Not currently symptomatic.   Primary osteoarthritis of both hands: Intermittent CMC joint pain bilaterally.  Some tenderness over the right CMC and first MCP joint.  Discussed the option of using a CMC joint brace as well as applying Voltaren  gel topically as needed for pain relief.  Primary osteoarthritis of both knees: Intermittent stiffness in the left knee.  No warmth or effusion noted today.   Trochanteric bursitis of both hips: Intermittent discomfort.   Myofascial muscle pain: Patient experiences intermittent myalgias and muscle tenderness consistent with myofascial pain.  At times she experiences chest wall pain consistent with costochondritis.  She remains under the care of pain management.  Other medical conditions are listed as follows:  Family history of rheumatoid arthritis  Family history of lupus erythematosus  History of hypertension: Blood pressure was 131/85 today in the office.  History of hyperlipidemia  History of anxiety  Vitamin D  deficiency  History of gastroesophageal reflux (GERD)   Orders: Orders Placed This Encounter  Procedures   Protein / creatinine ratio, urine   CBC with Differential/Platelet   ANA   Anti-DNA antibody, double-stranded   Sedimentation rate   C3 and C4   Comprehensive metabolic panel with GFR   Meds ordered this encounter  Medications  hydroxychloroquine  (PLAQUENIL ) 200 MG tablet    Sig: TAKE 1 TABLET BY MOUTH TWICE A DAY ON MONDAYS THROUGH FRIDAYS ONLY     Dispense:  120 tablet    Refill:  0     Follow-Up Instructions: Return in about 5 months (around 04/08/2024) for Autoimmune Disease.   Romayne Clubs, PA-C  Note - This record has been created using Dragon software.  Chart creation errors have been sought, but may not always  have been located. Such creation errors do not reflect on  the standard of medical care.

## 2023-10-27 ENCOUNTER — Other Ambulatory Visit: Payer: Self-pay | Admitting: Medical

## 2023-10-30 NOTE — Progress Notes (Unsigned)
 Patient: Shari Lawrence Date of Birth: 1961/05/04  Reason for Visit: Follow up History from: Patient Primary Neurologist: Gracie Lav   ASSESSMENT AND PLAN 63 y.o. year old female   1.  History of cervical decompression surgery 2.  Chronic migraine headaches 3.  History right sided neck pain  -Continue Maxalt  melt 5 mg as needed for acute headache, may combine with Tylenol  or NSAID - Continue follow-up with [facility], had cervical ESI with very good benefit -Dr. Gracie Lav performed right occipital nerve block in October 2024, continues with benefit - Next steps: Qulipta for migraine prevention - Tried and failed Ubrelvy  denied, Cymbalta , gabapentin , allergy to Celebrex, did not start Effexor  due to concern for side effect, Excedrin Migraine GI side effect, did not start Emgality  to fear of injection  -Follow-up in 6 months or sooner if needed with Dr. Gracie Lav   HISTORY  Shari Lawrence is a 63 year old female, seen in request by rheumatologist Dr. Nicholas Bari, and primary care physician Dr. Claudene Crystal, for evaluation of neck pain, radiating pain to right arm, initial evaluation was on April 08, 2019.    Past medical history of hypertension, hyperlipidemia, autoimmune disease, taking Plaquenil .  She did have a history of cervical decompression surgery in 2010, prior to the surgery, she presented with right cervical radiculopathy.   Since April 2020, she began to notice recurrent right neck pain, radiating pain to right shoulder, right arm, sometimes woke her up from sleep, she is helping her elderly parents, sometimes bearing weight of her father with her right shoulder, in addition, she complains of intermittent bilateral lower extremity numbness since July 23, 2020denies significant gait abnormality, no bowel bladder incontinence.   She also complains of low back pain, but denies shooting pain to bilateral lower extremities.   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   UPDATE 2021-01-16: She is overall doing much better, neck pain has improved, intermittent low back pain depending on her activity, stretching, lying flat usually helps, occasionally neck pain at the base of occipital region, rarely radiating pain, still has right hand intermittent numbness, previous EMG nerve conduction study February 2021 confirmed bilateral carpal tunnel syndromes, right side is more severe moderate   UPDATE October 11 2022: Debria Fang came in today complains of consistent neck pain, more on the right side, muscle tension, radiating pain to right parietal temporal region, with right eye discomfort, relieved by lying down,   She had long history of severe migraine headache, presenting with a lateralized retro-orbital area headache, light noise smell sensitivity,   Personally reviewed MRA of the brain and neck that was normal in February 2024     I reviewed her record from her rheumatologist Dr. Alvira Josephs, history of mild fascia muscle pain, autoimmune disease, sicca complex, complains of muscle pain, generalized weakness,   ANA was  positive with titer 1:320, positive double-stranded DNA, rheumatoid factor was negative, CCP was negative, history of nasal and oral ulcer in the past, positive synovitis in the left  hand,   Was treated with Plaquenil  200 mg twice a day,   UPDATE January 26 2023: She complains of severe right neck pain, radiating towards right temporal area over the past few months, causing constant right-sided headaches, right neck pain, she experienced a lot of anxiety, but is very hesitant to take medications, previously tolerating Lexapro ,  Update April 10, 2023 SS: Has been doing PT 3 times a week, working with right shoulder, anywhere that hurts. Dry needling to her neck once, a lot of tension, unclear benefit. Insurance denied Ubrelvy , tried Maxalt  without benefit. Saw neurosurgery, referred to PT. Pain behind right ear, has dry eyes from autoimmune condition. History of cluster headache, behind right eye, eyes turn red, sensitive to light, nauseated. With nerve block resolution of right occipital pain. Right now pain 3/10, would like nerve block.   MRI cervical spine 12/07/22 IMPRESSION: MRI scan cervical spine without contrast showing postoperative changes of anterior cervical fusion at C5-C7 and prominent disc degenerative change at C4-5 resulting in mild canal and foraminal narrowing.   Update Oct 31, 2023 SS: Never started the Emgality , she didn't feel like pain was migraine, was worried about side effect. Dr. Gracie Lav referred her to [place], had cervical ESI, helped her right neck pain/shoulder. Can get "traditional" migraine triggered by migraine, resolved with ibuprofen , right sided retro orbital. Would like to try Maxalt  again. End of semester, more right sided headache. Still benefit from [facility]. Headache once a week, can last 4 hours. Continues to benefit from right occipital nerve block  REVIEW OF SYSTEMS: Out of a complete 14 system review of symptoms, the patient complains only of the following symptoms, and all other reviewed systems are negative.  See HPI  ALLERGIES: Allergies  Allergen Reactions   Quinolones Anaphylaxis and Other (See Comments)    Whelps  developed all over her body   Codone [Hydrocodone] Nausea And Vomiting    Patient states "she doesn't feel normal" when she takes this medication and that it affects her mentally.   Garlic    Latex Itching and Other (See Comments)    Skin yellowing   Levaquin [Levofloxacin] Hives    Throat swelling   Losartan      Intolerance, fluctuating BPs   Metoprolol      Self reported hypotension   Onion    Penicillins    Percocet [Oxycodone-Acetaminophen ]    Sulfa Drugs Cross Reactors Itching, Swelling and Other (See Comments)    Red face   Vicodin [Hydrocodone-Acetaminophen ] Itching and Nausea And Vomiting   Gluten Meal     Other reaction(s): Other (See Comments) IBS    HOME MEDICATIONS: Outpatient Medications Prior to Visit  Medication Sig Dispense Refill   acetaminophen  (TYLENOL ) 500 MG tablet Take 1 tablet (500 mg total) by mouth every 6 (six) hours as needed. 30 tablet 0   ALPRAZolam  (XANAX ) 0.5 MG tablet Take 0.5 mg by mouth as needed for anxiety.     amLODipine  (NORVASC ) 5 MG tablet TAKE 1 TABLET (5 MG TOTAL) BY MOUTH DAILY. 90 tablet 1   Ascorbic Acid (VITAMIN C) 1000 MG tablet Take 1,000 mg by mouth daily.     atorvastatin  (LIPITOR) 20 MG tablet TAKE 1 TABLET BY MOUTH EVERY DAY 30 tablet 2   B Complex Vitamins (B COMPLEX PO) Take 1 tablet by mouth daily.  calcium  carbonate (OS-CAL) 600 MG TABS Take 600 mg by mouth daily.     CINNAMON PO Take 1 tablet by mouth daily.      diclofenac  Sodium (VOLTAREN ) 1 % GEL Apply 2 g topically 4 (four) times daily.     escitalopram  (LEXAPRO ) 10 MG tablet Take 5 mg by mouth daily.     esomeprazole  (NEXIUM ) 40 MG capsule Take 40 mg by mouth every morning.     estradiol  (VIVELLE -DOT) 0.05 MG/24HR patch Place 1 patch (0.05 mg total) onto the skin 2 (two) times a week. 24 patch 4   Estradiol  (YUVAFEM ) 10 MCG TABS vaginal tablet PLACE ONE TABLET VAGINALLY TWICE WEEKLY AT BEDTIME 24 tablet 4   fish oil-omega-3 fatty acids 1000 MG capsule Take 1 g by  mouth daily.     fluticasone  (FLONASE ) 50 MCG/ACT nasal spray SPRAY 2 SPRAYS INTO EACH NOSTRIL EVERY DAY 16 mL 2   hydroxychloroquine  (PLAQUENIL ) 200 MG tablet TAKE 1 TABLET BY MOUTH TWICE A DAY ON MONDAYS THROUGH FRIDAYS ONLY 120 tablet 0   meloxicam  (MOBIC ) 7.5 MG tablet TAKE 1 TABLET BY MOUTH DAILY AS NEEDED FOR PAIN 30 tablet 1   Multiple Vitamin (MULTIVITAMIN) tablet Take 1 tablet by mouth daily.     Probiotic Product (PROBIOTIC ADVANCED PO) Take 1 tablet by mouth daily.      TURMERIC PO Take 1 tablet by mouth daily.      pantoprazole  (PROTONIX ) 40 MG tablet Take 1 tablet (40 mg total) by mouth daily. (Patient not taking: Reported on 06/05/2023) 90 tablet 0   No facility-administered medications prior to visit.    PAST MEDICAL HISTORY: Past Medical History:  Diagnosis Date   Anemia 11/2010   hematology consult prior; etiology malabsorption and uterine bleeding   Anxiety    COVID-19 10/28/2020   Gastric polyp    GERD (gastroesophageal reflux disease)    H/O bone density study 12/2007   H/O hysterectomy for benign disease 10/2012   History of echocardiogram 03/2010   normal LV function, EF 60-65%, mild left atrial enlargement   HTN (hypertension) 03/2010   hospitalization for HTN urgency   Hyperlipemia    Internal hemorrhoids    Lumbar degenerative disc disease    Migraine    Myalgia    Numbness and tingling    Prediabetes    Spondylosis, cervical    Umbilical hernia    Uterine fibroid    hx/o     PAST SURGICAL HISTORY: Past Surgical History:  Procedure Laterality Date   ABDOMINAL HYSTERECTOMY     CERVICAL FUSION  2010   CERVICAL SPINE SURGERY     CERVIX LESION DESTRUCTION  1988   CESAREAN SECTION     CHOLECYSTECTOMY  2003   COLONOSCOPY  08/2010   Dr. Tova Fresh   COLONOSCOPY  02/2017   Dr. Tova Fresh   ESOPHAGOGASTRODUODENOSCOPY  08/2010   Dr. Tova Fresh   EXPLORATORY LAPAROTOMY     HERNIA REPAIR     MYOMECTOMY  1998   PELVIC LAPAROSCOPY     ROBOTIC ASSISTED LAPAROSCOPIC  HYSTERECTOMY AND SALPINGECTOMY  10/2012   [facility]; laproscopic due to fibroids   UMBILICAL HERNIA REPAIR  10/2013   infected laparoscopic port, mesh placement    FAMILY HISTORY: Family History  Problem Relation Age of Onset   Hypertension Mother    Dementia Mother    Colon cancer Mother 87   Deep vein thrombosis Father    Hypertension Father    Cancer Father  prostate CA   Heart disease Father 47       CAD   Parkinsonism Father    Hypertension Sister    Hyperlipidemia Sister    Sjogren's syndrome Sister    Hypertension Brother    Hyperlipidemia Brother    Hypertension Brother    Rheum arthritis Brother    Diabetes Brother    Hypertension Maternal Aunt    Hyperlipidemia Maternal Aunt    Rheum arthritis Maternal Aunt    Diabetes Maternal Aunt        1 with type II, 1 with type 1   Diabetes Paternal Aunt        type II   Breast cancer Neg Hx     SOCIAL HISTORY: Social History   Socioeconomic History   Marital status: Divorced    Spouse name: Not on file   Number of children: 1   Years of education: college   Highest education level: Doctorate  Occupational History    Employer: [employer]    Comment: Programmer, systems, PhD candidate, [employer]  Tobacco Use   Smoking status: Never    Passive exposure: Never   Smokeless tobacco: Never  Vaping Use   Vaping status: Never Used  Substance and Sexual Activity   Alcohol use: No   Drug use: No   Sexual activity: Not Currently    Partners: Male    Birth control/protection: Surgical    Comment: hysterectomy, menarche 63yo, sexual debut 63yo  Other Topics Concern   Not on file  Social History Narrative   Single, 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      Has 1 daughter.      Right-handed.   0.5 cup caffeine per day.      05/2024   Social Drivers of Health   Financial Resource Strain: Not on file  Food  Insecurity: No Food Insecurity (06/01/2023)   Hunger Vital Sign    Worried About Running Out of Food in the Last Year: Never true    Ran Out of Food in the Last Year: Never true  Transportation Needs: No Transportation Needs (06/01/2023)   PRAPARE - Administrator, Civil Service (Medical): No    Lack of Transportation (Non-Medical): No  Physical Activity: Insufficiently Active (06/01/2023)   Exercise Vital Sign    Days of Exercise per Week: 3 days    Minutes of Exercise per Session: 40 min  Stress: Stress Concern Present (06/01/2023)   [employer] of Occupational Health - Occupational Stress Questionnaire    Feeling of Stress : Rather much  Social Connections: Moderately Integrated (06/01/2023)   Social Connection and Isolation Panel [NHANES]    Frequency of Communication with Friends and Family: More than three times a week    Frequency of Social Gatherings with Friends and Family: More than three times a week    Attends Religious Services: More than 4 times per year    Active Member of [employer] or Organizations: Yes    Attends Banker Meetings: More than 4 times per year    Marital Status: Divorced  Intimate Partner Violence: Not At Risk (06/01/2023)   Humiliation, Afraid, Rape, and Kick questionnaire    Fear of Current or Ex-Partner: No    Emotionally Abused: No    Physically Abused: No    Sexually Abused: No    PHYSICAL EXAM  Vitals:   10/31/23 0812  Pulse:  64  Weight: 128 lb (58.1 kg)  Height: 5\' 4"  (1.626 m)    Body mass index is 21.97 kg/m.  Generalized: Well developed, in no acute distress  Neurological examination  Mentation: Alert oriented to time, place, history taking. Follows all commands speech and language fluent Cranial nerve II-XII: Pupils were equal round reactive to light. Extraocular movements were full, visual field were full on confrontational test. Facial sensation and strength were normal. Head turning and shoulder shrug  were  normal and symmetric. Motor: The motor testing reveals 5 over 5 strength of all 4 extremities. Good symmetric motor tone is noted throughout.  Sensory: Sensory testing is intact to soft touch on all 4 extremities. No evidence of extinction is noted.  Coordination: Cerebellar testing reveals good finger-nose-finger and heel-to-shin bilaterally.  Gait and station: Gait is normal.  Reflexes: Deep tendon reflexes are symmetric and normal bilaterally.   DIAGNOSTIC DATA (LABS, IMAGING, TESTING) - I reviewed patient records, labs, notes, testing and imaging myself where available.  Lab Results  Component Value Date   WBC 5.1 10/11/2023   HGB 11.8 10/11/2023   HCT 35.4 10/11/2023   MCV 91 10/11/2023   PLT 211 10/11/2023      Component Value Date/Time   NA 142 06/01/2023 0913   K 4.4 06/01/2023 0913   CL 104 06/01/2023 0913   CO2 24 06/01/2023 0913   GLUCOSE 81 06/01/2023 0913   GLUCOSE 89 01/02/2023 1436   BUN 14 06/01/2023 0913   CREATININE 0.88 06/01/2023 0913   CREATININE 0.88 01/02/2023 1436   CALCIUM  9.8 06/01/2023 0913   PROT 6.5 06/01/2023 0913   ALBUMIN 4.3 06/01/2023 0913   AST 26 06/01/2023 0913   ALT 24 06/01/2023 0913   ALKPHOS 77 06/01/2023 0913   BILITOT 0.4 06/01/2023 0913   GFRNONAA >60 12/24/2022 1944   GFRNONAA 60 11/19/2020 1411   GFRAA 70 11/19/2020 1411   Lab Results  Component Value Date   CHOL 257 (H) 06/01/2023   HDL 83 06/01/2023   LDLCALC 159 (H) 06/01/2023   TRIG 91 06/01/2023   CHOLHDL 3.1 06/01/2023   Lab Results  Component Value Date   HGBA1C 6.2 (H) 06/01/2023   Lab Results  Component Value Date   VITAMINB12 486 05/06/2016   Lab Results  Component Value Date   TSH 1.600 06/01/2023    Jeanmarie Millet, AGNP-C, DNP 10/31/2023, 8:54 AM [facility] [address], [address] [place], [place] [address] [phone]

## 2023-10-31 ENCOUNTER — Other Ambulatory Visit (HOSPITAL_COMMUNITY): Payer: Self-pay

## 2023-10-31 ENCOUNTER — Encounter: Payer: Self-pay | Admitting: Neurology

## 2023-10-31 ENCOUNTER — Ambulatory Visit: Admitting: Neurology

## 2023-10-31 VITALS — HR 64 | Ht 64.0 in | Wt 128.0 lb

## 2023-10-31 DIAGNOSIS — M542 Cervicalgia: Secondary | ICD-10-CM | POA: Diagnosis not present

## 2023-10-31 DIAGNOSIS — G43709 Chronic migraine without aura, not intractable, without status migrainosus: Secondary | ICD-10-CM

## 2023-10-31 MED ORDER — QULIPTA 60 MG PO TABS: 60.0000 mg | ORAL_TABLET | Freq: Every day | ORAL | 11 refills | Status: DC

## 2023-10-31 MED ORDER — RIZATRIPTAN BENZOATE 5 MG PO TBDP: 5.0000 mg | ORAL_TABLET | ORAL | 6 refills | Status: DC | PRN

## 2023-10-31 NOTE — Patient Instructions (Addendum)
 Great to see you today. Happy Birthday!! Try Maxalt  5 mg melt as needed for acute headache, may combine with NSAID or Tylenol   Follow up with [facility] about repeat injection  If headaches increase let me know, will add something else

## 2023-11-07 ENCOUNTER — Ambulatory Visit: Payer: Self-pay | Admitting: Physician Assistant

## 2023-11-07 ENCOUNTER — Encounter: Payer: Self-pay | Admitting: Physician Assistant

## 2023-11-07 ENCOUNTER — Ambulatory Visit: Payer: BC Managed Care – PPO | Attending: Physician Assistant | Admitting: Physician Assistant

## 2023-11-07 VITALS — BP 131/85 | HR 56 | Resp 16 | Ht 64.0 in | Wt 128.4 lb

## 2023-11-07 DIAGNOSIS — M7711 Lateral epicondylitis, right elbow: Secondary | ICD-10-CM

## 2023-11-07 DIAGNOSIS — M7918 Myalgia, other site: Secondary | ICD-10-CM

## 2023-11-07 DIAGNOSIS — Z8261 Family history of arthritis: Secondary | ICD-10-CM

## 2023-11-07 DIAGNOSIS — Z8719 Personal history of other diseases of the digestive system: Secondary | ICD-10-CM

## 2023-11-07 DIAGNOSIS — M170 Bilateral primary osteoarthritis of knee: Secondary | ICD-10-CM

## 2023-11-07 DIAGNOSIS — M7061 Trochanteric bursitis, right hip: Secondary | ICD-10-CM

## 2023-11-07 DIAGNOSIS — E559 Vitamin D deficiency, unspecified: Secondary | ICD-10-CM

## 2023-11-07 DIAGNOSIS — M359 Systemic involvement of connective tissue, unspecified: Secondary | ICD-10-CM | POA: Diagnosis not present

## 2023-11-07 DIAGNOSIS — M7062 Trochanteric bursitis, left hip: Secondary | ICD-10-CM

## 2023-11-07 DIAGNOSIS — Z8679 Personal history of other diseases of the circulatory system: Secondary | ICD-10-CM

## 2023-11-07 DIAGNOSIS — M19041 Primary osteoarthritis, right hand: Secondary | ICD-10-CM

## 2023-11-07 DIAGNOSIS — Z8639 Personal history of other endocrine, nutritional and metabolic disease: Secondary | ICD-10-CM

## 2023-11-07 DIAGNOSIS — M19042 Primary osteoarthritis, left hand: Secondary | ICD-10-CM

## 2023-11-07 DIAGNOSIS — Z840 Family history of diseases of the skin and subcutaneous tissue: Secondary | ICD-10-CM

## 2023-11-07 DIAGNOSIS — Z8659 Personal history of other mental and behavioral disorders: Secondary | ICD-10-CM

## 2023-11-07 DIAGNOSIS — M3500 Sicca syndrome, unspecified: Secondary | ICD-10-CM | POA: Diagnosis not present

## 2023-11-07 DIAGNOSIS — Z79899 Other long term (current) drug therapy: Secondary | ICD-10-CM

## 2023-11-07 MED ORDER — HYDROXYCHLOROQUINE SULFATE 200 MG PO TABS: ORAL_TABLET | ORAL | 0 refills | Status: DC

## 2023-11-07 NOTE — Progress Notes (Signed)
 CBC WNL

## 2023-11-08 NOTE — Progress Notes (Signed)
 CMP WNL ESR WNL Urine protein creatinine ratio WNL

## 2023-11-08 NOTE — Progress Notes (Signed)
Complements WNL

## 2023-11-09 LAB — CBC WITH DIFFERENTIAL/PLATELET
Absolute Lymphocytes: 1480 {cells}/uL (ref 850–3900)
Absolute Monocytes: 316 {cells}/uL (ref 200–950)
Basophils Absolute: 41 {cells}/uL (ref 0–200)
Basophils Relative: 1 %
Eosinophils Absolute: 180 {cells}/uL (ref 15–500)
Eosinophils Relative: 4.4 %
HCT: 38.3 % (ref 35.0–45.0)
Hemoglobin: 12.3 g/dL (ref 11.7–15.5)
MCH: 29.4 pg (ref 27.0–33.0)
MCHC: 32.1 g/dL (ref 32.0–36.0)
MCV: 91.6 fL (ref 80.0–100.0)
MPV: 12.2 fL (ref 7.5–12.5)
Monocytes Relative: 7.7 %
Neutro Abs: 2083 {cells}/uL (ref 1500–7800)
Neutrophils Relative %: 50.8 %
Platelets: 188 10*3/uL (ref 140–400)
RBC: 4.18 10*6/uL (ref 3.80–5.10)
RDW: 13.3 % (ref 11.0–15.0)
Total Lymphocyte: 36.1 %
WBC: 4.1 10*3/uL (ref 3.8–10.8)

## 2023-11-09 LAB — ANTI-DNA ANTIBODY, DOUBLE-STRANDED: ds DNA Ab: 5 [IU]/mL — ABNORMAL HIGH

## 2023-11-09 LAB — COMPREHENSIVE METABOLIC PANEL WITH GFR
AG Ratio: 1.9 (calc) (ref 1.0–2.5)
ALT: 22 U/L (ref 6–29)
AST: 25 U/L (ref 10–35)
Albumin: 4.5 g/dL (ref 3.6–5.1)
Alkaline phosphatase (APISO): 77 U/L (ref 37–153)
BUN: 13 mg/dL (ref 7–25)
CO2: 27 mmol/L (ref 20–32)
Calcium: 9.8 mg/dL (ref 8.6–10.4)
Chloride: 106 mmol/L (ref 98–110)
Creat: 0.87 mg/dL (ref 0.50–1.05)
Globulin: 2.4 g/dL (ref 1.9–3.7)
Glucose, Bld: 78 mg/dL (ref 65–99)
Potassium: 4.1 mmol/L (ref 3.5–5.3)
Sodium: 141 mmol/L (ref 135–146)
Total Bilirubin: 0.4 mg/dL (ref 0.2–1.2)
Total Protein: 6.9 g/dL (ref 6.1–8.1)
eGFR: 75 mL/min/{1.73_m2} (ref >=60)

## 2023-11-09 LAB — PROTEIN / CREATININE RATIO, URINE
Creatinine, Urine: 205 mg/dL (ref 20–275)
Protein/Creat Ratio: 117 mg/g{creat} (ref 24–184)
Protein/Creatinine Ratio: 0.117 mg/mg{creat} (ref 0.024–0.184)
Total Protein, Urine: 24 mg/dL (ref 5–24)

## 2023-11-09 LAB — ANA: Anti Nuclear Antibody (ANA): NEGATIVE

## 2023-11-09 LAB — C3 AND C4
C3 Complement: 140 mg/dL (ref 83–193)
C4 Complement: 23 mg/dL (ref 15–57)

## 2023-11-09 LAB — SEDIMENTATION RATE: Sed Rate: 11 mm/h (ref 0–30)

## 2023-11-09 NOTE — Progress Notes (Signed)
 ANA negative

## 2023-11-10 NOTE — Progress Notes (Signed)
 dsDNA remains within indeterminate range but continues to trend down.   Labs are not consistent with a flare or active disease.

## 2023-11-12 ENCOUNTER — Other Ambulatory Visit: Payer: Self-pay | Admitting: Medical

## 2023-11-21 ENCOUNTER — Encounter: Payer: Self-pay | Admitting: Orthopaedic Surgery

## 2023-11-21 ENCOUNTER — Ambulatory Visit: Admitting: Orthopaedic Surgery

## 2023-11-21 DIAGNOSIS — M545 Low back pain, unspecified: Secondary | ICD-10-CM | POA: Diagnosis not present

## 2023-11-21 DIAGNOSIS — G8929 Other chronic pain: Secondary | ICD-10-CM | POA: Diagnosis not present

## 2023-11-21 NOTE — Progress Notes (Signed)
 Office Visit Note   Patient: Shari Lawrence           Date of Birth: 1961-04-02           MRN: [id number] Visit Date: 11/21/2023              Requested by: Claudene Crystal, PA-C [address] [place],  [place] [address] PCP: Claudene Crystal, PA-C   Assessment & Plan: Visit Diagnoses:  1. Chronic right-sided low back pain, unspecified whether sciatica present     Plan: History of Present Illness Shari Lawrence is a 63 year old female who presents with back pain and numbness in her left leg.  She experiences numbness in her left leg, described as a 'sleep feeling' or 'not all there,' with the most severe episode lasting about three days while sitting on a stool. The sensation gradually improved. Recently, she has noticed a sensation of heat in her ankle, described as a 'burning pain' that feels like heat but does not hurt, along with a 'cold to the touch' sensation in her leg. No new weakness in her legs is noted, and she remains functional despite these sensations.  She is not currently taking gabapentin  or prednisone . She manages her symptoms without medication and focuses on maintaining functionality. She has a history of chronic pain management for neck issues, including physical therapy and injections due to irritation at C7, and has a plate in her neck. It has been years since her last MRI of the back.  Lumbar spine exam shows no spinous or paraspinous tenderness. She does have pain with lumbar flexion and extension. Negative straight leg raise. No focal weakness. She does have pain in the groin with logroll as well as Stinchfield test. She is neurovascularly intact distally.   Assessment and Plan Sciatica Chronic back pain with intermittent numbness and burning in the left leg, suggestive of sciatica. Likely due to a pinched nerve. MRI needed to assess further. - Order MRI of the spine. - Follow up with pain management clinic for further treatment options,  including potential injections. - Consider referral to spine surgeon if symptoms persist and injections are ineffective.  Chronic neck pain with cervical radiculopathy Chronic neck pain with cervical radiculopathy, C7 irritation noted. Managed by pain management clinic. - Continue management with pain management clinic, including ongoing injections as needed.  Follow-Up Instructions: No follow-ups on file.   Orders:  Orders Placed This Encounter  Procedures   MR Lumbar Spine w/o contrast   No orders of the defined types were placed in this encounter.     Procedures: No procedures performed   Clinical Data: No additional findings.   Subjective: Chief Complaint  Patient presents with   Lower Back - Numbness, Pain    HPI  Review of Systems  Constitutional: Negative.   HENT: Negative.    Eyes: Negative.   Respiratory: Negative.    Cardiovascular: Negative.   Endocrine: Negative.   Musculoskeletal: Negative.   Neurological: Negative.   Hematological: Negative.   Psychiatric/Behavioral: Negative.    All other systems reviewed and are negative.    Objective: Vital Signs: LMP 10/25/2012 (Approximate)   Physical Exam Vitals and nursing note reviewed.  Constitutional:      Appearance: She is well-developed.  HENT:     Head: Atraumatic.     Nose: Nose normal.  Eyes:     Extraocular Movements: Extraocular movements intact.  Cardiovascular:     Pulses: Normal pulses.  Pulmonary:  Effort: Pulmonary effort is normal.  Abdominal:     Palpations: Abdomen is soft.  Musculoskeletal:     Cervical back: Neck supple.  Skin:    General: Skin is warm.     Capillary Refill: Capillary refill takes less than 2 seconds.  Neurological:     Mental Status: She is alert. Mental status is at baseline.  Psychiatric:        Behavior: Behavior normal.        Thought Content: Thought content normal.        Judgment: Judgment normal.     Ortho Exam  Specialty Comments:   No specialty comments available.  Imaging: No results found.   PMFS History: Patient Active Problem List   Diagnosis Date Noted   Chronic pain syndrome 05/16/2023   Chronic migraine w/o aura, not intractable, w/o stat migr 10/11/2022   Neck pain 10/11/2022   Subcutaneous nodule of head 09/28/2022   Imbalance 04/29/2022   Scoliosis 09/14/2021   Chronic right-sided thoracic back pain 07/28/2020   Cervicalgia 07/28/2020   Prediabetes    Leg numbness 05/04/2020   Knee swelling 05/04/2020   History of recent stressful life event 11/20/2019   Panic attack 11/15/2019   Sleep disturbance 11/13/2019   Bilateral carpal tunnel syndrome 08/21/2019   Chronic pain of both knees 06/14/2019   Right arm pain 06/14/2019   Paresthesia 04/08/2019   Radicular pain in right arm 03/30/2019   Vaccine counseling 05/21/2018   Need for influenza vaccination 05/21/2018   Routine general medical examination at a health care facility 05/21/2018   Leg pain, diffuse, left 06/28/2017   Leg swelling 06/28/2017   Myofascial muscle pain 06/28/2017   High risk medication use 03/30/2017   Chronic constipation 11/17/2016   Globus sensation 11/17/2016   Primary osteoarthritis of both knees 10/05/2016   Autoimmune disease (HCC) 09/29/2016   Primary osteoarthritis of both hands 09/29/2016   Family history of lupus erythematosus 09/20/2016   Polyarthralgia 05/06/2016   Family history of rheumatoid arthritis 05/06/2016   Essential hypertension 12/23/2014   History of anemia 12/23/2014   Hyperlipidemia 11/11/2012   Gluten intolerance 10/25/2011   Gastroesophageal reflux disease without esophagitis 10/25/2011   Anxiety    Iron deficiency anemia    Past Medical History:  Diagnosis Date   Anemia 11/2010   hematology consult prior; etiology malabsorption and uterine bleeding   Anxiety    COVID-19 10/28/2020   Gastric polyp    GERD (gastroesophageal reflux disease)    H/O bone density study 12/2007   H/O  hysterectomy for benign disease 10/2012   History of echocardiogram 03/2010   normal LV function, EF 60-65%, mild left atrial enlargement   HTN (hypertension) 03/2010   hospitalization for HTN urgency   Hyperlipemia    Internal hemorrhoids    Lumbar degenerative disc disease    Migraine    Myalgia    Numbness and tingling    Prediabetes    Spondylosis, cervical    Umbilical hernia    Uterine fibroid    hx/o     Family History  Problem Relation Age of Onset   Hypertension Mother    Dementia Mother    Colon cancer Mother 32   Deep vein thrombosis Father    Hypertension Father    Cancer Father        prostate CA   Heart disease Father 46       CAD   Parkinsonism Father    Hypertension Sister  Hyperlipidemia Sister    Sjogren's syndrome Sister    Heart attack Sister    Hypertension Brother    Hyperlipidemia Brother    Hypertension Brother    Rheum arthritis Brother    Diabetes Brother    Hypertension Maternal Aunt    Hyperlipidemia Maternal Aunt    Rheum arthritis Maternal Aunt    Diabetes Maternal Aunt        1 with type II, 1 with type 1   Diabetes Paternal Aunt        type II   Breast cancer Neg Hx     Past Surgical History:  Procedure Laterality Date   ABDOMINAL HYSTERECTOMY     CERVICAL FUSION  2010   CERVICAL SPINE SURGERY     CERVIX LESION DESTRUCTION  1988   CESAREAN SECTION     CHOLECYSTECTOMY  2003   COLONOSCOPY  08/2010   Dr. Tova Fresh   COLONOSCOPY  02/2017   Dr. Tova Fresh   ESOPHAGOGASTRODUODENOSCOPY  08/2010   Dr. Tova Fresh   EXPLORATORY LAPAROTOMY     HERNIA REPAIR     MYOMECTOMY  1998   PELVIC LAPAROSCOPY     ROBOTIC ASSISTED LAPAROSCOPIC HYSTERECTOMY AND SALPINGECTOMY  10/2012   [facility]; laproscopic due to fibroids   UMBILICAL HERNIA REPAIR  10/2013   infected laparoscopic port, mesh placement   Social History   Occupational History    Employer: [employer]    Comment: molecular Production manager, PhD candidate, [employer]  Tobacco Use    Smoking status: Never    Passive exposure: Never   Smokeless tobacco: Never  Vaping Use   Vaping status: Never Used  Substance and Sexual Activity   Alcohol use: No   Drug use: No   Sexual activity: Not Currently    Partners: Male    Birth control/protection: Surgical    Comment: hysterectomy, menarche 63yo, sexual debut 63yo

## 2023-11-29 ENCOUNTER — Other Ambulatory Visit: Payer: Self-pay | Admitting: Orthopaedic Surgery

## 2023-12-02 ENCOUNTER — Other Ambulatory Visit

## 2023-12-04 ENCOUNTER — Other Ambulatory Visit

## 2023-12-05 ENCOUNTER — Other Ambulatory Visit

## 2023-12-06 ENCOUNTER — Ambulatory Visit
Admission: RE | Admit: 2023-12-06 | Discharge: 2023-12-06 | Discharge status: Home / Self Care | Source: Ambulatory Visit | Attending: Orthopaedic Surgery | Admitting: Orthopaedic Surgery

## 2023-12-06 DIAGNOSIS — G8929 Other chronic pain: Secondary | ICD-10-CM

## 2023-12-20 ENCOUNTER — Ambulatory Visit: Payer: Self-pay | Admitting: Orthopaedic Surgery

## 2023-12-25 ENCOUNTER — Ambulatory Visit: Payer: Self-pay

## 2023-12-25 NOTE — Telephone Encounter (Signed)
 FYI Only or Action Required?: FYI only for provider.  Patient was last seen in primary care on 10/11/2023 by Randol Dawes, MD. Called Nurse Triage reporting Sinus Problem. Symptoms began several days ago. Interventions attempted: OTC medications: Benadryl /Nyquil. Symptoms are: unchanged.  Triage Disposition: See Physician Within 24 Hours  Patient/caregiver understands and will follow disposition?: UnsureReason for Disposition  Earache  Answer Assessment - Initial Assessment Questions 1. LOCATION: Where does it hurt?      Head  2. ONSET: When did the sinus pain start?  (e.g., hours, days)      2 days ago  3. SEVERITY: How bad is the pain?   (Scale 1-10; mild, moderate or severe)   - MILD (1-3): doesn't interfere with normal activities    - MODERATE (4-7): interferes with normal activities (e.g., work or school) or awakens from sleep   - SEVERE (8-10): excruciating pain and patient unable to do any normal activities        Ear pain; mild  5. NASAL CONGESTION: Is the nose blocked? If Yes, ask: Can you open it or must you breathe through your mouth?     At times  6. NASAL DISCHARGE: Do you have discharge from your nose? If so ask, What color?     clear 7. FEVER: Do you have a fever? If Yes, ask: What is it, how was it measured, and when did it start?      Not sure  8. OTHER SYMPTOMS: Do you have any other symptoms? (e.g., sore throat, cough, earache, difficulty breathing)     Body aches in lower back, eyes hurt;      Pt has been taken Benadryl /Nyquil for symptoms. Pt is going to take a home covid test and then go to [facility] if positive. No office appts until 7/8.  Protocols used: Sinus Pain or Congestion-A-AH

## 2023-12-25 NOTE — Telephone Encounter (Signed)
 Attempt X1 to call pt regarding s/s reported.  No response noted   Unable to leave a VM- mailbox full.

## 2024-02-02 ENCOUNTER — Ambulatory Visit: Admitting: Medical

## 2024-02-02 VITALS — BP 120/70 | HR 54 | Temp 97.8°F | Wt 128.8 lb

## 2024-02-02 DIAGNOSIS — M7918 Myalgia, other site: Secondary | ICD-10-CM

## 2024-02-02 DIAGNOSIS — M79672 Pain in left foot: Secondary | ICD-10-CM

## 2024-02-02 DIAGNOSIS — M79651 Pain in right thigh: Secondary | ICD-10-CM | POA: Diagnosis not present

## 2024-02-02 DIAGNOSIS — M79652 Pain in left thigh: Secondary | ICD-10-CM

## 2024-02-02 DIAGNOSIS — E785 Hyperlipidemia, unspecified: Secondary | ICD-10-CM | POA: Diagnosis not present

## 2024-02-02 NOTE — Progress Notes (Signed)
 Subjective: Chief Complaint  Patient presents with   Acute Visit    Left foot pain, top of foot that is hurting.    Here for complaint of left foot pain for months with some bruising.  She denies trauma fall or injury. When she rubs the foot it hurts even more.  She has been wearing shoes that are too tight.  She does still wear some flats of late.  She also has been having some pains in her medial thigh bilaterally.  She is seeing physical therapy for chronic low back pain and leg pain and IT band.  She has history of myofascial pain in general.  She denies any specific exercise or activity that would have flared up her leg pain.  No numbness or tingling legs.  She had an MRI of her low back in June  She is compliant with her cholesterol medicine.  She like to recheck her cholesterol today.  Her brother has a history of mini stroke and sister history of heart issue.  She wanted to make sure cholesterol medicine is working.  No other aggravating or relieving factors. No other complaint.  ROS as in subjective    Objective: BP 120/70   Pulse (!) 54   Temp 97.8 F (36.6 C)   Wt 128 lb 12.8 oz (58.4 kg)   LMP 10/25/2012 (Approximate)   SpO2 98%   BMI 22.11 kg/m   General appearance: alert, no distress, well developed, well nourished Left foot with some faint bruising over the dorsal 2nd and 3rd mid to distal metatarsals without obvious deformity or bony abnormality.  She does seem to have some tenderness with flexion and extension of the 2nd and 3rd toes.  Great toe nontender without deformity.  Otherwise toe and ankle range of motion normal.  No swelling. She is tender somewhat throughout the muscle and fascia of both thighs medially and laterally including IT band. But leg range of motion normal. Legs neurovascularly intact Ext: no edema   Assessment: Encounter Diagnoses  Name Primary?   Left foot pain Yes   Pain in both thighs    Hyperlipidemia, unspecified hyperlipidemia  type    Myofascial muscle pain      Plan: Left foot pain-seems to be connective tissue or plantar fascia related, metatarsalgia.  Advised for the next couple weeks to use more of a tennis shoe or supportive shoe with good arch support.  Avoid going barefoot or wearing flats or tight shoes for now.  Avoid heels as well for the time being.  She can use cold therapy with a bucket of cold water 15 minutes twice a day.  She can use tennis ball rolling stretch in the mornings.  No obvious indication for imaging at this time.  Symptoms should gradually improve with these measures over the next few weeks  Thigh pain-myofascial etiology most likely.  She is also tender in the lateral thighs as well and has some IT band tenderness.  She continues with physical therapy for chronic low back pain.  We discussed some of the home stretches and exercises she can do to help with the thigh pain.  This does not appear to be radicular in nature but more myofascial muscle related.  I reviewed her recent lumbar spine MRI from 12/06/2023.  No definite lumbar bulging disc or radicular finding  Hyperlipidemia-updated fasting labs today.  Continue atorvastatin  20 mg daily  I reviewed her CT angiogram from 08/06/2022 showing no CAD.  I reviewed her MR angio  neck from 2023 showing no significant sclerosis   Shari Lawrence was seen today for acute visit.  Diagnoses and all orders for this visit:  Left foot pain  Pain in both thighs  Hyperlipidemia, unspecified hyperlipidemia type -     Lipid panel  Myofascial muscle pain    Follow up: pending lab

## 2024-02-03 LAB — LIPID PANEL
Chol/HDL Ratio: 2.2 ratio (ref 0.0–4.4)
Cholesterol, Total: 181 mg/dL (ref 100–199)
HDL: 83 mg/dL (ref >39)
LDL Chol Calc (NIH): 87 mg/dL (ref 0–99)
Triglycerides: 55 mg/dL (ref 0–149)
VLDL Cholesterol Cal: 11 mg/dL (ref 5–40)

## 2024-02-05 ENCOUNTER — Ambulatory Visit: Payer: Self-pay | Admitting: Medical

## 2024-02-05 ENCOUNTER — Other Ambulatory Visit: Payer: Self-pay | Admitting: Medical

## 2024-02-05 MED ORDER — ATORVASTATIN CALCIUM 20 MG PO TABS: 20.0000 mg | ORAL_TABLET | Freq: Every day | ORAL | 2 refills | Status: AC

## 2024-02-05 NOTE — Progress Notes (Signed)
 Results thru my chart

## 2024-02-20 ENCOUNTER — Other Ambulatory Visit: Payer: Self-pay | Admitting: Radiology

## 2024-02-20 ENCOUNTER — Other Ambulatory Visit: Payer: Self-pay | Admitting: Physician Assistant

## 2024-02-20 DIAGNOSIS — Z1231 Encounter for screening mammogram for malignant neoplasm of breast: Secondary | ICD-10-CM

## 2024-02-20 DIAGNOSIS — M359 Systemic involvement of connective tissue, unspecified: Secondary | ICD-10-CM

## 2024-02-20 NOTE — Telephone Encounter (Signed)
 Last Fill: 11/07/2023  Eye exam: 01/29/2024 OCT is entirely reassuring.  Labs: 11/07/2023  CMP WNL  ESR WNL  Urine protein creatinine ratio WNL   Next Visit: 04/11/2024  Last Visit: 11/07/2023  DX: Autoimmune disease   Current Dose per office note 11/07/2023: Plaquenil  200 mg 1 tablet by mouth daily twice daily Monday through Friday only.   Okay to refill Plaquenil ?

## 2024-03-05 ENCOUNTER — Ambulatory Visit

## 2024-03-28 NOTE — Progress Notes (Deleted)
 Office Visit Note  Patient: Shari Lawrence             Date of Birth: 1961/05/16           MRN: [id number]             PCP: Bulah Alm RAMAN, PA-C Referring: Bulah Alm RAMAN, PA-C Visit Date: 04/11/2024 Occupation: Data Unavailable  Subjective:    History of Present Illness: Shari Lawrence is a 63 y.o. female with history of autoimmune disease and osteoarthritis.  Patient remains on Plaquenil  200 mg 1 tablet by mouth daily twice daily Monday through Friday only.   CBC and CMP updated on 11/07/23.  Orders for CBC and CMP released today.  PLQ Eye Exam: 01/29/2024 OCT is entirely reassuring. HVF (10/2) is full OU, albiet with high fixation losses.Dr. Octavia will simply trend the field over time. No suggestion of toxicity. Okay to continue PLQ. Follow up 1 year     Activities of Daily Living:  Patient reports morning stiffness for *** {minute/hour:19697}.   Patient {ACTIONS;DENIES/REPORTS:21021675::Denies} nocturnal pain.  Difficulty dressing/grooming: {ACTIONS;DENIES/REPORTS:21021675::Denies} Difficulty climbing stairs: {ACTIONS;DENIES/REPORTS:21021675::Denies} Difficulty getting out of chair: {ACTIONS;DENIES/REPORTS:21021675::Denies} Difficulty using hands for taps, buttons, cutlery, and/or writing: {ACTIONS;DENIES/REPORTS:21021675::Denies}  No Rheumatology ROS completed.   PMFS History:  Patient Active Problem List   Diagnosis Date Noted   Chronic pain syndrome 05/16/2023   Chronic migraine w/o aura, not intractable, w/o stat migr 10/11/2022   Neck pain 10/11/2022   Subcutaneous nodule of head 09/28/2022   Imbalance 04/29/2022   Scoliosis 09/14/2021   Chronic right-sided thoracic back pain 07/28/2020   Cervicalgia 07/28/2020   Prediabetes    Leg numbness 05/04/2020   Knee swelling 05/04/2020   History of recent stressful life event 11/20/2019   Panic attack 11/15/2019   Sleep disturbance 11/13/2019   Bilateral carpal tunnel syndrome 08/21/2019    Chronic pain of both knees 06/14/2019   Right arm pain 06/14/2019   Paresthesia 04/08/2019   Radicular pain in right arm 03/30/2019   Vaccine counseling 05/21/2018   Need for influenza vaccination 05/21/2018   Routine general medical examination at a health care facility 05/21/2018   Leg pain, diffuse, left 06/28/2017   Leg swelling 06/28/2017   Myofascial muscle pain 06/28/2017   High risk medication use 03/30/2017   Chronic constipation 11/17/2016   Globus sensation 11/17/2016   Primary osteoarthritis of both knees 10/05/2016   Autoimmune disease 09/29/2016   Primary osteoarthritis of both hands 09/29/2016   Family history of lupus erythematosus 09/20/2016   Polyarthralgia 05/06/2016   Family history of rheumatoid arthritis 05/06/2016   Essential hypertension 12/23/2014   History of anemia 12/23/2014   Hyperlipidemia 11/11/2012   Gluten intolerance 10/25/2011   Gastroesophageal reflux disease without esophagitis 10/25/2011   Anxiety    Iron deficiency anemia     Past Medical History:  Diagnosis Date   Anemia 11/2010   hematology consult prior; etiology malabsorption and uterine bleeding   Anxiety    COVID-19 10/28/2020   Gastric polyp    GERD (gastroesophageal reflux disease)    H/O bone density study 12/2007   H/O hysterectomy for benign disease 10/2012   History of echocardiogram 03/2010   normal LV function, EF 60-65%, mild left atrial enlargement   HTN (hypertension) 03/2010   hospitalization for HTN urgency   Hyperlipemia    Internal hemorrhoids    Lumbar degenerative disc disease    Migraine    Myalgia    Numbness and tingling  Prediabetes    Spondylosis, cervical    Umbilical hernia    Uterine fibroid    hx/o     Family History  Problem Relation Age of Onset   Hypertension Mother    Dementia Mother    Colon cancer Mother 67   Deep vein thrombosis Father    Hypertension Father    Cancer Father        prostate CA   Heart disease Father 87        CAD   Parkinsonism Father    Hypertension Sister    Hyperlipidemia Sister    Sjogren's syndrome Sister    Heart attack Sister    Hypertension Brother    Hyperlipidemia Brother    Hypertension Brother    Rheum arthritis Brother    Diabetes Brother    Hypertension Maternal Aunt    Hyperlipidemia Maternal Aunt    Rheum arthritis Maternal Aunt    Diabetes Maternal Aunt        1 with type II, 1 with type 1   Diabetes Paternal Aunt        type II   Breast cancer Neg Hx    Past Surgical History:  Procedure Laterality Date   ABDOMINAL HYSTERECTOMY     CERVICAL FUSION  2010   CERVICAL SPINE SURGERY     CERVIX LESION DESTRUCTION  1988   CESAREAN SECTION     CHOLECYSTECTOMY  2003   COLONOSCOPY  08/2010   Dr. Kristie   COLONOSCOPY  02/2017   Dr. Kristie   ESOPHAGOGASTRODUODENOSCOPY  08/2010   Dr. 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      Has 1 daughter.      Right-handed.   0.5 cup caffeine per day.      05/2024     Immunization History  Administered Date(s) Administered   Influenza,inj,Quad PF,6+ Mos 05/01/2013, 03/31/2014, 05/17/2017, 05/21/2018, 03/29/2019, 04/10/2020, 06/11/2021, 05/17/2022   PFIZER Comirnaty(Gray Top)Covid-19 Tri-Sucrose Vaccine 10/08/2020   PFIZER(Purple Top)SARS-COV-2 Vaccination 08/24/2019, 09/17/2019, 03/30/2020    Pfizer(Comirnaty)Fall Seasonal Vaccine 12 years and older 07/11/2022   Td 02/04/2012   Tdap 06/01/2023   Zoster Recombinant(Shingrix) 09/15/2020, 12/08/2020     Objective: Vital Signs: LMP 10/25/2012 (Approximate)    Physical Exam Vitals and nursing note reviewed.  Constitutional:      Appearance: She is well-developed.  HENT:     Head: Normocephalic and atraumatic.  Eyes:     Conjunctiva/sclera: Conjunctivae normal.  Cardiovascular:     Rate and Rhythm: Normal rate and regular rhythm.     Heart sounds: Normal heart sounds.  Pulmonary:     Effort: Pulmonary effort is normal.     Breath sounds: Normal breath sounds.  Abdominal:     General: Bowel sounds are normal.  Palpations: Abdomen is soft.  Musculoskeletal:     Cervical back: Normal range of motion.  Lymphadenopathy:     Cervical: No cervical adenopathy.  Skin:    General: Skin is warm and dry.     Capillary Refill: Capillary refill takes less than 2 seconds.  Neurological:     Mental Status: She is alert and oriented to person, place, and time.  Psychiatric:        Behavior: Behavior normal.      Musculoskeletal Exam: ***  CDAI Exam: CDAI Score: -- Patient Global: --; Provider Global: -- Swollen: --; Tender: -- Joint Exam 04/11/2024   No joint exam has been documented for this visit   There is currently no information documented on the homunculus. Go to the Rheumatology activity and complete the homunculus joint exam.  Investigation: No additional findings.  Imaging: No results found.  Recent Labs: Lab Results  Component Value Date   WBC 4.1 11/07/2023   HGB 12.3 11/07/2023   PLT 188 11/07/2023   NA 141 11/07/2023   K 4.1 11/07/2023   CL 106 11/07/2023   CO2 27 11/07/2023   GLUCOSE 78 11/07/2023   BUN 13 11/07/2023   CREATININE 0.87 11/07/2023   BILITOT 0.4 11/07/2023   ALKPHOS 77 06/01/2023   AST 25 11/07/2023   ALT 22 11/07/2023   PROT 6.9 11/07/2023   ALBUMIN 4.3 06/01/2023    CALCIUM  9.8 11/07/2023   GFRAA 70 11/19/2020    Speciality Comments: PLQ Eye Exam: 01/29/2024 OCT is entirely reassuring. HVF (10/2) is full OU, albiet with high fixation losses.Dr. Octavia will simply trend the field over time. No suggestion of toxicity. Okay to continue PLQ.  Follow up 1 year   Procedures:  No procedures performed Allergies: Quinolones, Codone [hydrocodone], Garlic, Latex, Levaquin [levofloxacin], Losartan , Metoprolol , Onion, Penicillins, Percocet [oxycodone-acetaminophen ], Sulfa drugs cross reactors, Vicodin [hydrocodone-acetaminophen ], and Gluten meal   Assessment / Plan:     Visit Diagnoses: Autoimmune disease  Sicca complex  High risk medication use  Lateral epicondylitis, right elbow  Primary osteoarthritis of both hands  Primary osteoarthritis of both knees  Trochanteric bursitis of both hips  Myofascial muscle pain  Family history of rheumatoid arthritis  Family history of lupus erythematosus  History of hypertension  History of hyperlipidemia  History of anxiety  Vitamin D  deficiency  History of gastroesophageal reflux (GERD)  Orders: No orders of the defined types were placed in this encounter.  No orders of the defined types were placed in this encounter.   Face-to-face time spent with patient was *** minutes. Greater than 50% of time was spent in counseling and coordination of care.  Follow-Up Instructions: No follow-ups on file.   Waddell CHRISTELLA Craze, PA-C  Note - This record has been created using Dragon software.  Chart creation errors have been sought, but may not always  have been located. Such creation errors do not reflect on  the standard of medical care.

## 2024-04-09 ENCOUNTER — Ambulatory Visit (INDEPENDENT_AMBULATORY_CARE_PROVIDER_SITE_OTHER): Admitting: Medical

## 2024-04-09 VITALS — BP 130/82 | HR 55 | Temp 97.6°F | Resp 16 | Wt 133.6 lb

## 2024-04-09 DIAGNOSIS — R52 Pain, unspecified: Secondary | ICD-10-CM

## 2024-04-09 DIAGNOSIS — J988 Other specified respiratory disorders: Secondary | ICD-10-CM

## 2024-04-09 DIAGNOSIS — H9202 Otalgia, left ear: Secondary | ICD-10-CM | POA: Diagnosis not present

## 2024-04-09 DIAGNOSIS — R0981 Nasal congestion: Secondary | ICD-10-CM | POA: Diagnosis not present

## 2024-04-09 LAB — POCT INFLUENZA A/B
Influenza A, POC: NEGATIVE
Influenza B, POC: NEGATIVE

## 2024-04-09 LAB — POC COVID19 BINAXNOW: SARS Coronavirus 2 Ag: NEGATIVE

## 2024-04-09 MED ORDER — CLARITHROMYCIN 500 MG PO TABS: 500.0000 mg | ORAL_TABLET | Freq: Two times a day (BID) | ORAL | 0 refills | Status: DC

## 2024-04-09 MED ORDER — FLUTICASONE PROPIONATE 50 MCG/ACT NA SUSP: 1.0000 | Freq: Every day | NASAL | 5 refills | Status: AC

## 2024-04-09 NOTE — Progress Notes (Signed)
 Subjective:  Shari Lawrence is a 63 y.o. female who presents for Chief Complaint  Patient presents with   Acute Visit    Sore throat, body aches, congestion. Symptoms started Saturday.      Here for 2-1/2-day history of illness.  She notes cold symptoms, body aches, anterior throat discomfort underneath the mandible, left earache, congestion, some cough.  No shortness of breath or wheezing.  No vomiting but some nausea.  No diarrhea.  Just using NyQuil last night for the first time.  No specific sick contacts.  No other aggravating or relieving factors.    No other c/o.  Past Medical History:  Diagnosis Date   Anemia 11/2010   hematology consult prior; etiology malabsorption and uterine bleeding   Anxiety    COVID-19 10/28/2020   Gastric polyp    GERD (gastroesophageal reflux disease)    H/O bone density study 12/2007   H/O hysterectomy for benign disease 10/2012   History of echocardiogram 03/2010   normal LV function, EF 60-65%, mild left atrial enlargement   HTN (hypertension) 03/2010   hospitalization for HTN urgency   Hyperlipemia    Internal hemorrhoids    Lumbar degenerative disc disease    Migraine    Myalgia    Numbness and tingling    Prediabetes    Spondylosis, cervical    Umbilical hernia    Uterine fibroid    hx/o    Current Outpatient Medications on File Prior to Visit  Medication Sig Dispense Refill   acetaminophen  (TYLENOL ) 500 MG tablet Take 1 tablet (500 mg total) by mouth every 6 (six) hours as needed. 30 tablet 0   ALPRAZolam  (XANAX ) 0.5 MG tablet Take 0.5 mg by mouth as needed for anxiety.     amLODipine  (NORVASC ) 5 MG tablet TAKE 1 TABLET (5 MG TOTAL) BY MOUTH DAILY. 90 tablet 1   Ascorbic Acid (VITAMIN C) 1000 MG tablet Take 1,000 mg by mouth daily.     atorvastatin  (LIPITOR) 20 MG tablet Take 1 tablet (20 mg total) by mouth daily. 90 tablet 2   B Complex Vitamins (B COMPLEX PO) Take 1 tablet by mouth daily.     calcium  carbonate (OS-CAL)  600 MG TABS Take 600 mg by mouth daily.     CINNAMON PO Take 1 tablet by mouth daily.      escitalopram  (LEXAPRO ) 10 MG tablet Take 5 mg by mouth daily.     esomeprazole  (NEXIUM ) 40 MG capsule Take 40 mg by mouth every morning.     estradiol  (VIVELLE -DOT) 0.05 MG/24HR patch Place 1 patch (0.05 mg total) onto the skin 2 (two) times a week. 24 patch 4   Estradiol  (YUVAFEM ) 10 MCG TABS vaginal tablet PLACE ONE TABLET VAGINALLY TWICE WEEKLY AT BEDTIME 24 tablet 4   fish oil-omega-3 fatty acids 1000 MG capsule Take 1 g by mouth daily.     hydroxychloroquine  (PLAQUENIL ) 200 MG tablet TAKE 1 TABLET BY MOUTH TWICE A DAY ON MONDAYS THROUGH FRIDAYS ONLY 120 tablet 0   meloxicam  (MOBIC ) 7.5 MG tablet TAKE 1 TABLET BY MOUTH DAILY AS NEEDED FOR PAIN 30 tablet 1   Multiple Vitamin (MULTIVITAMIN) tablet Take 1 tablet by mouth daily.     Probiotic Product (PROBIOTIC ADVANCED PO) Take 1 tablet by mouth daily.      TURMERIC PO Take 1 tablet by mouth daily.      No current facility-administered medications on file prior to visit.     The following portions of the  patient's history were reviewed and updated as appropriate: allergies, current medications, past family history, past medical history, past social history, past surgical history and problem list.  ROS Otherwise as in subjective above  Objective: BP 130/82   Pulse (!) 55   Temp 97.6 F (36.4 C)   Resp 16   Wt 133 lb 9.6 oz (60.6 kg)   LMP 10/25/2012 (Approximate)   SpO2 99%   BMI 22.93 kg/m   General appearance: alert, no distress, well developed, well nourished HEENT: normocephalic, sclerae anicteric, conjunctiva pink and moist, left TM with some retracted eardrum and slight opacity at the suggesting some fluid otherwise right TM relatively normal looking, nares with turbinate swelling and some erythema , pharynx normal Oral cavity: MMM, no lesions Neck: supple, no lymphadenopathy, no thyromegaly, no masses Heart: RRR, normal S1, S2, no  murmurs Lungs: CTA bilaterally, no wheezes, rhonchi, or rales Pulses: 2+ radial pulses, 2+ pedal pulses, normal cap refill Ext: no edema    Assessment: Encounter Diagnoses  Name Primary?   Respiratory tract infection Yes   Body aches    Head congestion    Otalgia of left ear      Plan: COVID and flu swab negative today.  Advise good hydration, rest, begin over-the-counter remedies such as Mucinex  DM or Coricidin HBP.  Can use analgesic as needed over-the-counter.  If worse ear discomfort in the next 48 hours then begin the Biaxin  antibiotic  If not seeing improvement within the next 72 hours, then call or recheck  Amere was seen today for acute visit.  Diagnoses and all orders for this visit:  Respiratory tract infection  Body aches -     POCT Influenza A/B -     POC COVID-19  Head congestion -     POCT Influenza A/B -     POC COVID-19  Otalgia of left ear  Other orders -     fluticasone  (FLONASE ) 50 MCG/ACT nasal spray; Place 1 spray into both nostrils daily. -     clarithromycin  (BIAXIN ) 500 MG tablet; Take 1 tablet (500 mg total) by mouth 2 (two) times daily.    Follow up: prn

## 2024-04-11 ENCOUNTER — Ambulatory Visit: Payer: Self-pay | Admitting: Medical

## 2024-04-11 ENCOUNTER — Ambulatory Visit: Admitting: Physician Assistant

## 2024-04-11 DIAGNOSIS — M7061 Trochanteric bursitis, right hip: Secondary | ICD-10-CM

## 2024-04-11 DIAGNOSIS — Z79899 Other long term (current) drug therapy: Secondary | ICD-10-CM

## 2024-04-11 DIAGNOSIS — Z8659 Personal history of other mental and behavioral disorders: Secondary | ICD-10-CM

## 2024-04-11 DIAGNOSIS — M359 Systemic involvement of connective tissue, unspecified: Secondary | ICD-10-CM

## 2024-04-11 DIAGNOSIS — Z8639 Personal history of other endocrine, nutritional and metabolic disease: Secondary | ICD-10-CM

## 2024-04-11 DIAGNOSIS — M7918 Myalgia, other site: Secondary | ICD-10-CM

## 2024-04-11 DIAGNOSIS — M7711 Lateral epicondylitis, right elbow: Secondary | ICD-10-CM

## 2024-04-11 DIAGNOSIS — Z8679 Personal history of other diseases of the circulatory system: Secondary | ICD-10-CM

## 2024-04-11 DIAGNOSIS — M3500 Sicca syndrome, unspecified: Secondary | ICD-10-CM

## 2024-04-11 DIAGNOSIS — M19042 Primary osteoarthritis, left hand: Secondary | ICD-10-CM

## 2024-04-11 DIAGNOSIS — M170 Bilateral primary osteoarthritis of knee: Secondary | ICD-10-CM

## 2024-04-11 DIAGNOSIS — Z8261 Family history of arthritis: Secondary | ICD-10-CM

## 2024-04-11 DIAGNOSIS — Z840 Family history of diseases of the skin and subcutaneous tissue: Secondary | ICD-10-CM

## 2024-04-11 DIAGNOSIS — Z8719 Personal history of other diseases of the digestive system: Secondary | ICD-10-CM

## 2024-04-11 DIAGNOSIS — E559 Vitamin D deficiency, unspecified: Secondary | ICD-10-CM

## 2024-04-11 NOTE — Telephone Encounter (Signed)
 FYI Only or Action Required?: Action required by provider: update on patient condition.  Patient was last seen in primary care on 04/09/2024 by Bulah Alm RAMAN, PA-C.  Called Nurse Triage reporting Nasal Congestion.  Symptoms began several days ago.  Interventions attempted: Prescription medications: clarithromycin  (BIAXIN ) 500 MG tablet (started today).  Symptoms are: unchanged.  Triage Disposition: Home Care  Patient/caregiver understands and will follow disposition?: Yes      Pt was seen in office on 10/14 by Alm Bulah, PA Symptom onset: Mon night A lot of pain in chest down at breastbone (3/10 pain level; pt states it is not her heart) and back of throat/ in esophagus area (not a new symptom) Chest feels tight Denies SOB, fever Pressure in face Coughing a lot- dry Starting to loose voice Just started abx today     Copied from CRM [id number]. Topic: Clinical - Red Word Triage >> Apr 11, 2024  2:43 PM Gustabo D wrote: Pt saw doc on Tuesday and says she isn't getting better and has tightness in chest Reason for Disposition  Care advice for mild cough, questions about  Answer Assessment - Initial Assessment Questions This RN spoke with pt:  Seen in office on 10/14 by Alm Bulah  Symptom onset: Monday night  Reports chest pain (3/10) located at breastbone and back of throat/esophagus area  Chest feels tight with pressure in face   Frequent coughing and starting to lose voice  Started antibiotic today  Recommendation: Educated pt that antibiotics may take a few days to start improving symptoms. Encouraged increased fluids and rest. Advised to monitor for worsening chest pain, shortness of breath, high fever, or difficulty breathing/swallowing - if any occur, go to ED or call 911. This RN also advised pt to go to urgent care if needed as they are open later than clinics. Pt agreeable.  Protocols used: Common Cold-A-AH

## 2024-04-12 NOTE — Telephone Encounter (Signed)
 Pt was notified.

## 2024-04-23 ENCOUNTER — Ambulatory Visit: Admitting: Medical

## 2024-04-23 VITALS — BP 122/80 | HR 52 | Temp 97.4°F | Wt 132.2 lb

## 2024-04-23 DIAGNOSIS — K1379 Other lesions of oral mucosa: Secondary | ICD-10-CM

## 2024-04-23 DIAGNOSIS — J029 Acute pharyngitis, unspecified: Secondary | ICD-10-CM

## 2024-04-23 DIAGNOSIS — H68009 Unspecified Eustachian salpingitis, unspecified ear: Secondary | ICD-10-CM

## 2024-04-23 LAB — POCT RAPID STREP A (OFFICE): Rapid Strep A Screen: NEGATIVE

## 2024-04-23 MED ORDER — MAGIC MOUTHWASH: 5.0000 mL | ORAL | 0 refills | Status: AC | PRN

## 2024-04-23 MED ORDER — PSEUDOEPHEDRINE HCL 60 MG PO TABS: 60.0000 mg | ORAL_TABLET | Freq: Two times a day (BID) | ORAL | 0 refills | Status: DC

## 2024-04-23 NOTE — Progress Notes (Signed)
 Subjective: Chief Complaint  Patient presents with   Sore Throat    Still having throat pain from the last visit that she was here. Still having it on right side. Feels like she has uclers but can't see them. Tender to the touch. Was on antibiotic so not sure if it was yeast related too   History of Present Illness Shari Lawrence is a 63 year old female who presents with persistent throat soreness and mouth discomfort.  She has been experiencing persistent throat soreness primarily on one side for the past two weeks, with significant pain at night affecting her sleep. The soreness is generalized on one side rather than localized to the back of her throat. Her mouth feels unwell, and her tongue is irritated and dry. She has been gargling with warm salt water but has not taken any other medications for these symptoms in the past 48 hours.  She was previously on antibiotics, which resolved her drainage issues, but she is unsure if her current symptoms are a side effect of the antibiotics. No runny nose, sneezing, or eye irritation. She reports a little cough today, which had previously cleared up. Her brother was sick over the weekend, but she is unsure if it was related to her symptoms.  She continues to take Nexium  regularly and denies any worsening of heartburn or foul taste in her mouth. She reports feeling dizzy at times, particularly in class, and describes a 'swooshing sound' in her ears, which she does not associate with her throat symptoms. She no longer experiences the stabbing ear pain she had previously.  She uses Flonase , which she applied last night, but it has not alleviated her current symptoms. She is not currently taking any allergy pills or decongestants. She has not used any inhalers recently. Her tongue feels tender, especially when her teeth touch it, and she has experienced discomfort in her mouth without visible ulcers.  She has been prescribed Magic Mouthwash in the  past, which she is currently out of. 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  No other aggravating or relieving factors. No other complaint.  Past Medical History:  Diagnosis Date   Anemia 11/2010   hematology consult prior; etiology malabsorption and uterine bleeding   Anxiety    COVID-19 10/28/2020   Gastric polyp    GERD (gastroesophageal reflux disease)    H/O bone density study 12/2007   H/O hysterectomy for benign disease 10/2012   History of echocardiogram 03/2010   normal LV function, EF 60-65%, mild left atrial enlargement   HTN (hypertension) 03/2010   hospitalization for HTN urgency   Hyperlipemia    Internal hemorrhoids    Lumbar degenerative disc disease    Migraine    Myalgia    Numbness and tingling    Prediabetes    Spondylosis, cervical    Umbilical hernia    Uterine fibroid    hx/o    Current Outpatient Medications on File Prior to Visit  Medication Sig Dispense Refill   acetaminophen  (TYLENOL ) 500 MG tablet Take 1 tablet (500 mg total) by mouth every 6 (six) hours as needed. 30 tablet 0   ALPRAZolam  (XANAX ) 0.5 MG tablet Take 0.5 mg by mouth as needed for anxiety.     amLODipine  (NORVASC ) 5 MG tablet TAKE 1 TABLET (5 MG TOTAL) BY MOUTH DAILY. 90 tablet 1   Ascorbic Acid (VITAMIN C) 1000 MG tablet Take 1,000 mg by mouth daily.  atorvastatin  (LIPITOR) 20 MG tablet Take 1 tablet (20 mg total) by mouth daily. 90 tablet 2   B Complex Vitamins (B COMPLEX PO) Take 1 tablet by mouth daily.     calcium  carbonate (OS-CAL) 600 MG TABS Take 600 mg by mouth daily.     CINNAMON PO Take 1 tablet by mouth daily.      escitalopram  (LEXAPRO ) 10 MG tablet Take 5 mg by mouth daily.     esomeprazole  (NEXIUM ) 40 MG capsule Take 40 mg by mouth every morning.     estradiol  (VIVELLE -DOT) 0.05 MG/24HR patch Place 1 patch (0.05 mg total) onto the skin 2 (two) times a week. 24 patch 4   Estradiol  (YUVAFEM ) 10 MCG TABS vaginal tablet PLACE ONE TABLET VAGINALLY  TWICE WEEKLY AT BEDTIME 24 tablet 4   fish oil-omega-3 fatty acids 1000 MG capsule Take 1 g by mouth daily.     fluticasone  (FLONASE ) 50 MCG/ACT nasal spray Place 1 spray into both nostrils daily. 16 mL 5   hydroxychloroquine  (PLAQUENIL ) 200 MG tablet TAKE 1 TABLET BY MOUTH TWICE A DAY ON MONDAYS THROUGH FRIDAYS ONLY 120 tablet 0   meloxicam  (MOBIC ) 7.5 MG tablet TAKE 1 TABLET BY MOUTH DAILY AS NEEDED FOR PAIN 30 tablet 1   Multiple Vitamin (MULTIVITAMIN) tablet Take 1 tablet by mouth daily.     Probiotic Product (PROBIOTIC ADVANCED PO) Take 1 tablet by mouth daily.      TURMERIC PO Take 1 tablet by mouth daily.      No current facility-administered medications on file prior to visit.   ROS as in subjective   Objective: BP 122/80   Pulse (!) 52   Temp (!) 97.4 F (36.3 C)   Wt 132 lb 3.2 oz (60 kg)   LMP 10/25/2012 (Approximate)   BMI 22.69 kg/m   General appearence: alert, no distress, WD/WN,  HEENT: normocephalic, sclerae anicteric, TMs flat, no erythema, no air fluid levels, nares patent, no discharge or erythema, pharynx with mild edema Oral cavity: MMM, no lesions Neck: supple, no lymphadenopathy, no thyromegaly, no masses Lungs: CTA bilaterally, no wheezes, rhonchi, or rales     Assessment and Plan Encounter Diagnoses  Name Primary?   Salpingitis of Eustachian tube, unspecified laterality Yes   ST (sore throat)    Mouth discomfort     Eustachian tube dysfunction with throat soreness, oral mucosal irritation, and ear pressure. Throat erythema and flat eardrums noted. No otitis media or oral candidiasis.  Differential includes autoimmune-related oral mucosal irritation. Discussed decongestant risks due to hypertension. - -Prescribe Sudafed 60 mg, BID x 3-5 days, halve if elevation of BP occurs. - Advise warm salt water gargles and adequate hydration. - Recommend avoiding acidic, spicy, or citrus foods. - Prescribe Magic Mouthwash for oral discomfort. - Consider ENT  referral if symptoms persist after 10 days.    Shari Lawrence was seen today for sore throat.  Diagnoses and all orders for this visit:  Salpingitis of Eustachian tube, unspecified laterality  ST (sore throat) -     Rapid Strep A  Mouth discomfort  Other orders -     pseudoephedrine  (SUDAFED) 60 MG tablet; Take 1 tablet (60 mg total) by mouth in the morning and at bedtime. -     magic mouthwash SOLN; Take 5 mLs by mouth as needed.    F/u prn

## 2024-04-29 ENCOUNTER — Encounter: Payer: Self-pay | Admitting: Radiology

## 2024-05-07 ENCOUNTER — Encounter: Payer: Self-pay | Admitting: Neurology

## 2024-05-07 ENCOUNTER — Ambulatory Visit: Admitting: Neurology

## 2024-05-07 VITALS — BP 136/88 | HR 56 | Ht 64.0 in | Wt 133.5 lb

## 2024-05-07 DIAGNOSIS — G43709 Chronic migraine without aura, not intractable, without status migrainosus: Secondary | ICD-10-CM

## 2024-05-07 DIAGNOSIS — M542 Cervicalgia: Secondary | ICD-10-CM | POA: Diagnosis not present

## 2024-05-07 DIAGNOSIS — M7918 Myalgia, other site: Secondary | ICD-10-CM

## 2024-05-07 NOTE — Progress Notes (Signed)
 ASSESSMENT AND PLAN 63 y.o. year old female   1.  History of cervical decompression surgery 2.  Chronic migraine headaches 3.  History right sided neck pain  Overall is doing better. Continue Maxalt  melt 5 mg as needed for acute headache, may combine with Tylenol  or NSAID  Continue follow up with PCP, return as needed.  DIAGNOSTIC DATA (LABS, IMAGING, TESTING) - I reviewed patient records, labs, notes, testing and imaging myself where available.   HISTORY  Shari Lawrence is a 63 year old female, seen in request by rheumatologist Dr. Dolphus Reiter, and primary care physician Dr. Bulah Alm RAMAN, for evaluation of neck pain, radiating pain to right arm, initial evaluation was on April 08, 2019.    Past medical history of hypertension, hyperlipidemia, autoimmune disease, taking Plaquenil .  She did have a history of cervical decompression surgery in 2010, prior to the surgery, she presented with right cervical radiculopathy.   Since April 2020, she began to notice recurrent right neck pain, radiating pain to right shoulder, right arm, sometimes woke her up from sleep, she is helping her elderly parents, sometimes bearing weight of her father with her right shoulder, in addition, she complains of intermittent bilateral lower extremity numbness since 22-Jul-2020denies significant gait abnormality, no bowel bladder incontinence.   She also complains of low back pain, but denies shooting pain to bilateral lower extremities.   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   UPDATE January 05 2021: She is overall doing much better, neck pain has improved, intermittent low back pain depending on her activity, stretching, lying flat usually helps, occasionally neck pain at the base of occipital region, rarely radiating pain, still has right hand intermittent numbness, previous EMG nerve conduction study February 2021 confirmed bilateral carpal tunnel syndromes, right side is more severe moderate   UPDATE October 11 2022: Prentice came in today complains of consistent neck pain, more on the right side, muscle tension, radiating pain to right parietal temporal region, with right eye discomfort, relieved by lying down,   She had long history of severe migraine headache, presenting with a lateralized retro-orbital area headache, light noise smell sensitivity,   Personally reviewed MRA of the brain and neck that was normal in February 2024     I reviewed her record from her rheumatologist Dr. Dolphus, history of mild fascia muscle pain, autoimmune disease, sicca complex, complains of muscle pain, generalized weakness,   ANA was positive with titer 1:320, positive double-stranded DNA, rheumatoid factor was negative, CCP was negative, history of nasal and oral ulcer in the past, positive synovitis in the left hand,   Was treated with Plaquenil  200 mg twice a day,   UPDATE January 26 2023: She complains of severe right neck pain, radiating towards right temporal area over the past few months, causing constant right-sided headaches, right neck  pain, she experienced a lot of anxiety, but is very hesitant to take medications, previously tolerating Lexapro ,  UPDATE May 07 2024: She is overall doing better, her migraine is  occasionally, prn ibuprofen  was helpful.  She also take Tylenol  occasionally for neck pain,    PHYSICAL EXAM  Vitals:   05/07/24 1007  BP: 136/88  Pulse: (!) 56  SpO2: 98%  Weight: 133 lb 8 oz (60.6 kg)  Height: 5' 4 (1.626 m)    Body mass index is 22.92 kg/m.   PHYSICAL EXAMNIATION:  Gen: NAD, conversant, well nourised, well groomed                     Cardiovascular: Regular rate rhythm, no peripheral edema, warm, nontender. Eyes: Conjunctivae clear without exudates or hemorrhage Neck: Supple, no carotid bruits. Pulmonary: Clear to auscultation bilaterally   NEUROLOGICAL EXAM:  MENTAL STATUS: Speech/cognition: Awake, alert oriented to history taking and casual conversation  CRANIAL NERVES: CN II: Visual fields are full to confrontation.  Pupils are round equal and briskly reactive to light. CN III, IV, VI: extraocular movement are normal. No ptosis. CN V: Facial sensation is intact to pinprick in all 3 divisions bilaterally. Corneal responses are intact.  CN VII: Face is symmetric with normal eye closure and smile. CN VIII: Hearing is normal to casual conversation CN IX, X: Palate elevates symmetrically. Phonation is normal. CN XI: Head turning and shoulder shrug are intact CN XII: Tongue is midline with normal movements and no atrophy.  MOTOR: There is no pronator drift of out-stretched arms. Muscle bulk and tone are normal. Muscle strength is normal.  REFLEXES: Reflexes are 2  and symmetric at the biceps, triceps, knees, and ankles. Plantar responses are flexor.  SENSORY: Intact to light touch, pinprick, positional and vibratory sensation are intact in fingers and toes.  COORDINATION: Rapid alternating movements and fine finger movements are intact. There is no dysmetria on finger-to-nose and heel-knee-shin.    GAIT/STANCE: Posture is normal. Gait is steady   REVIEW OF SYSTEMS: Out of a complete 14 system review of symptoms, the patient complains only of  the following symptoms, and all other reviewed systems are negative.  See HPI  ALLERGIES: Allergies  Allergen Reactions   Other Anaphylaxis   Quinolones Anaphylaxis and Other (See Comments)    Whelps developed all over her body   Codone [Hydrocodone] Nausea And Vomiting    Patient states she doesn't feel normal when she takes this medication and that it affects her mentally.   Garlic    Latex Itching and Other (See Comments)    Skin yellowing   Levaquin [Levofloxacin] Hives    Throat swelling   Losartan      Intolerance, fluctuating BPs   Metoprolol      Self reported hypotension   Onion    Penicillins Dermatitis   Percocet [Oxycodone-Acetaminophen ]    Sulfa Drugs Cross Reactors     Other Reaction(s): turns red   Vicodin [Hydrocodone-Acetaminophen ] Itching and Nausea And Vomiting   Gluten Meal     Other reaction(s): Other (See Comments) IBS    HOME MEDICATIONS: Outpatient Medications Prior to Visit  Medication Sig Dispense Refill   acetaminophen  (TYLENOL ) 500 MG tablet Take 1 tablet (500 mg total) by mouth every 6 (six) hours as needed. 30 tablet 0   ALPRAZolam  (XANAX ) 0.5 MG tablet Take 0.5 mg by mouth as needed for anxiety.     amLODipine  (NORVASC ) 5 MG tablet TAKE 1 TABLET (5 MG TOTAL)  BY MOUTH DAILY. 90 tablet 1   Ascorbic Acid (VITAMIN C) 1000 MG tablet Take 1,000 mg by mouth daily.     atorvastatin  (LIPITOR) 20 MG tablet Take 1 tablet (20 mg total) by mouth daily. 90 tablet 2   B Complex Vitamins (B COMPLEX PO) Take 1 tablet by mouth daily.     calcium  carbonate (OS-CAL) 600 MG TABS Take 600 mg by mouth daily.     CINNAMON PO Take 1 tablet by mouth daily.      escitalopram  (LEXAPRO ) 10 MG tablet Take 5 mg by mouth daily.     esomeprazole  (NEXIUM ) 40 MG capsule Take 40 mg by mouth every morning.     estradiol  (VIVELLE -DOT) 0.05 MG/24HR patch Place 1 patch (0.05 mg total) onto the skin 2 (two) times a week. 24 patch 4   Estradiol  (YUVAFEM ) 10 MCG TABS vaginal tablet  PLACE ONE TABLET VAGINALLY TWICE WEEKLY AT BEDTIME 24 tablet 4   fish oil-omega-3 fatty acids 1000 MG capsule Take 1 g by mouth daily.     fluticasone  (FLONASE ) 50 MCG/ACT nasal spray Place 1 spray into both nostrils daily. 16 mL 5   hydroxychloroquine  (PLAQUENIL ) 200 MG tablet TAKE 1 TABLET BY MOUTH TWICE A DAY ON MONDAYS THROUGH FRIDAYS ONLY 120 tablet 0   magic mouthwash SOLN Take 5 mLs by mouth as needed. 60 mL 0   meloxicam  (MOBIC ) 7.5 MG tablet TAKE 1 TABLET BY MOUTH DAILY AS NEEDED FOR PAIN 30 tablet 1   Multiple Vitamin (MULTIVITAMIN) tablet Take 1 tablet by mouth daily.     Probiotic Product (PROBIOTIC ADVANCED PO) Take 1 tablet by mouth daily.      TURMERIC PO Take 1 tablet by mouth daily.      pseudoephedrine  (SUDAFED) 60 MG tablet Take 1 tablet (60 mg total) by mouth in the morning and at bedtime. 30 tablet 0   No facility-administered medications prior to visit.    PAST MEDICAL HISTORY: Past Medical History:  Diagnosis Date   Anemia 11/2010   hematology consult prior; etiology malabsorption and uterine bleeding   Anxiety    COVID-19 10/28/2020   Gastric polyp    GERD (gastroesophageal reflux disease)    H/O bone density study 12/2007   H/O hysterectomy for benign disease 10/2012   History of echocardiogram 03/2010   normal LV function, EF 60-65%, mild left atrial enlargement   HTN (hypertension) 03/2010   hospitalization for HTN urgency   Hyperlipemia    Internal hemorrhoids    Lumbar degenerative disc disease    Migraine    Myalgia    Numbness and tingling    Prediabetes    Spondylosis, cervical    Umbilical hernia    Uterine fibroid    hx/o     PAST SURGICAL HISTORY: Past Surgical History:  Procedure Laterality Date   ABDOMINAL HYSTERECTOMY     CERVICAL FUSION  2010   CERVICAL SPINE SURGERY     CERVIX LESION DESTRUCTION  1988   CESAREAN SECTION     CHOLECYSTECTOMY  2003   COLONOSCOPY  08/2010   Dr. Kristie   COLONOSCOPY  02/2017   Dr. Kristie    ESOPHAGOGASTRODUODENOSCOPY  08/2010   Dr. Kristie   EXPLORATORY LAPAROTOMY     HERNIA REPAIR     MYOMECTOMY  1998   PELVIC LAPAROSCOPY     ROBOTIC ASSISTED LAPAROSCOPIC HYSTERECTOMY AND SALPINGECTOMY  10/2012   [facility]; laproscopic due to fibroids   UMBILICAL HERNIA REPAIR  10/2013  infected laparoscopic port, mesh placement    FAMILY HISTORY: Family History  Problem Relation Age of Onset   Hypertension Mother    Dementia Mother    Colon cancer Mother 34   Deep vein thrombosis Father    Hypertension Father    Cancer Father        prostate CA   Heart disease Father 27       CAD   Parkinsonism Father    Hypertension Sister    Hyperlipidemia Sister    Sjogren's syndrome Sister    Heart attack Sister    Hypertension Brother    Hyperlipidemia Brother    Hypertension Brother    Rheum arthritis Brother    Diabetes Brother    Hypertension Maternal Aunt    Hyperlipidemia Maternal Aunt    Rheum arthritis Maternal Aunt    Diabetes Maternal Aunt        1 with type II, 1 with type 1   Diabetes Paternal Aunt        type II   Breast cancer Neg Hx     SOCIAL HISTORY: Social History   Socioeconomic History   Marital status: Divorced    Spouse name: Not on file   Number of children: 1   Years of education: college   Highest education level: Doctorate  Occupational History    Employer: [employer]    Comment: programmer, systems, PhD candidate, [employer]  Tobacco Use   Smoking status: Never    Passive exposure: Never   Smokeless tobacco: Never  Vaping Use   Vaping status: Never Used  Substance and Sexual Activity   Alcohol use: No   Drug use: No   Sexual activity: Not Currently    Partners: Male    Birth control/protection: Surgical    Comment: hysterectomy, menarche 63yo, sexual debut 63yo  Other Topics Concern   Not on file  Social History Narrative   Single, 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      Has 1 daughter.      Right-handed.   0.5 cup caffeine per day.      05/2024   Social Drivers of Health   Financial Resource Strain: Not on file  Food Insecurity: No Food Insecurity (06/01/2023)   Hunger Vital Sign    Worried About Running Out of Food in the Last Year: Never true    Ran Out of Food in the Last Year: Never true  Transportation Needs: No Transportation Needs (06/01/2023)   PRAPARE - Administrator, Civil Service (Medical): No    Lack of Transportation (Non-Medical): No  Physical Activity: Insufficiently Active (06/01/2023)   Exercise Vital Sign    Days of Exercise per Week: 3 days    Minutes of Exercise per Session: 40 min  Stress: Stress Concern Present (06/01/2023)   [employer] of Occupational Health - Occupational Stress Questionnaire    Feeling of Stress : Rather much  Social Connections: Moderately Integrated (06/01/2023)   Social Connection and Isolation Panel    Frequency of Communication with Friends and Family: More than three times a week    Frequency of Social Gatherings with Friends and Family: More than three times a week    Attends Religious Services: More than 4 times per year    Active Member of [employer] or Organizations: Yes    Attends Banker Meetings: More than 4 times per year  Marital Status: Divorced  Catering Manager Violence: Not At Risk (06/01/2023)   Humiliation, Afraid, Rape, and Kick questionnaire    Fear of Current or Ex-Partner: No    Emotionally Abused: No    Physically Abused: No    Sexually Abused: No     Modena Callander. M.D. Ph.D.

## 2024-05-16 ENCOUNTER — Ambulatory Visit
Admission: RE | Admit: 2024-05-16 | Discharge: 2024-05-16 | Disposition: A | Discharge status: Home / Self Care | Source: Ambulatory Visit | Attending: Radiology | Admitting: Radiology

## 2024-05-16 DIAGNOSIS — Z1231 Encounter for screening mammogram for malignant neoplasm of breast: Secondary | ICD-10-CM

## 2024-05-17 ENCOUNTER — Other Ambulatory Visit: Payer: Self-pay | Admitting: Medical

## 2024-05-20 ENCOUNTER — Other Ambulatory Visit: Payer: Self-pay | Admitting: Physician Assistant

## 2024-05-20 DIAGNOSIS — M359 Systemic involvement of connective tissue, unspecified: Secondary | ICD-10-CM

## 2024-05-20 NOTE — Telephone Encounter (Signed)
 Last Fill: 02/20/2024  Eye exam: 01/29/2024   Labs: 11/07/2023 CMP WNL  ESR WNL  Urine protein creatinine ratio WNL  Complements WNL  ANA negative  dsDNA remains within indeterminate range but continues to trend down.   Labs are not consistent with a flare or active disease.   Next Visit: was due October 2025.  Last Visit: 11/07/2023  IK:Jlunpfflwz disease   Current Dose per office note on 11/07/2023: Plaquenil  200 mg 1 tablet by mouth daily twice daily Monday through Friday only.   Okay to refill Plaquenil ?

## 2024-05-20 NOTE — Telephone Encounter (Signed)
 Contacted the patient to advise she is due for labs and needed an appointment. Scheduled an appointment for the patient on 05/22/2024 and patient states she will update her labs then.

## 2024-05-21 NOTE — Progress Notes (Unsigned)
 Office Visit Note  Patient: Shari Lawrence             Date of Birth: Aug 28, 1960           MRN: [id number]             PCP: Bulah Alm RAMAN, PA-C Referring: Bulah Alm RAMAN, PA-C Visit Date: 05/22/2024 Occupation: Data Unavailable  Subjective:  Medication monitoring  History of Present Illness: Shari Lawrence is a 63 y.o. female with history of autoimmune disease.  Patient remains on plaquenil  200 mg 1 tablet by mouth twice daily Monday through Friday.  She is tolerating Plaquenil  without any side effects and has not had any recent gaps in therapy.  Patient denies any signs or symptoms of a flare.  She continues to have chronic sicca symptoms which have been stable.  She continues to have intermittent arthralgias and myalgias.  She denies any joint swelling at this time.  She denies any oral ulcers at this time but states that she continues to have sensitivity of the tongue.  She has used Magic mouthwash in the past which has been helpful.      Activities of Daily Living:  Patient reports morning stiffness for 0 minute.   Patient Reports nocturnal pain.  Difficulty dressing/grooming: Denies Difficulty climbing stairs: Reports Difficulty getting out of chair: Reports Difficulty using hands for taps, buttons, cutlery, and/or writing: Reports  Review of Systems  Constitutional:  Negative for fatigue.  HENT:  Positive for mouth dryness. Negative for mouth sores.   Eyes:  Positive for dryness.  Respiratory:  Negative for shortness of breath.   Cardiovascular:  Positive for chest pain and palpitations.  Gastrointestinal:  Negative for blood in stool, constipation and diarrhea.  Endocrine: Negative for increased urination.  Genitourinary:  Negative for involuntary urination.  Musculoskeletal:  Positive for joint pain, joint pain, myalgias, muscle tenderness and myalgias. Negative for gait problem, joint swelling, muscle weakness and morning stiffness.  Skin:   Positive for sensitivity to sunlight. Negative for color change, rash and hair loss.  Allergic/Immunologic: Negative for susceptible to infections.  Neurological:  Positive for dizziness and headaches.  Hematological:  Negative for swollen glands.  Psychiatric/Behavioral:  Positive for sleep disturbance. Negative for depressed mood. The patient is nervous/anxious.     PMFS History:  Patient Active Problem List   Diagnosis Date Noted   Chronic pain syndrome 05/16/2023   Chronic migraine w/o aura, not intractable, w/o stat migr 10/11/2022   Neck pain 10/11/2022   Subcutaneous nodule of head 09/28/2022   Imbalance 04/29/2022   Scoliosis 09/14/2021   Chronic right-sided thoracic back pain 07/28/2020   Cervicalgia 07/28/2020   Prediabetes    Leg numbness 05/04/2020   Knee swelling 05/04/2020   History of recent stressful life event 11/20/2019   Panic attack 11/15/2019   Sleep disturbance 11/13/2019   Bilateral carpal tunnel syndrome 08/21/2019   Chronic pain of both knees 06/14/2019   Right arm pain 06/14/2019   Paresthesia 04/08/2019   Radicular pain in right arm 03/30/2019   Vaccine counseling 05/21/2018   Need for influenza vaccination 05/21/2018   Routine general medical examination at a health care facility 05/21/2018   Leg pain, diffuse, left 06/28/2017   Leg swelling 06/28/2017   Myofascial muscle pain 06/28/2017   High risk medication use 03/30/2017   Chronic constipation 11/17/2016   Globus sensation 11/17/2016   Primary osteoarthritis of both knees 10/05/2016   Autoimmune disease 09/29/2016   Primary osteoarthritis of  both hands 09/29/2016   Family history of lupus erythematosus 09/20/2016   Polyarthralgia 05/06/2016   Family history of rheumatoid arthritis 05/06/2016   Essential hypertension 12/23/2014   History of anemia 12/23/2014   Hyperlipidemia 11/11/2012   Gluten intolerance 10/25/2011   Gastroesophageal reflux disease without esophagitis 10/25/2011    Anxiety    Iron deficiency anemia     Past Medical History:  Diagnosis Date   Anemia 11/2010   hematology consult prior; etiology malabsorption and uterine bleeding   Anxiety    COVID-19 10/28/2020   Gastric polyp    GERD (gastroesophageal reflux disease)    H/O bone density study 12/2007   H/O hysterectomy for benign disease 10/2012   History of echocardiogram 03/2010   normal LV function, EF 60-65%, mild left atrial enlargement   HTN (hypertension) 03/2010   hospitalization for HTN urgency   Hyperlipemia    Internal hemorrhoids    Lumbar degenerative disc disease    Migraine    Myalgia    Numbness and tingling    Prediabetes    Spondylosis, cervical    Umbilical hernia    Uterine fibroid    hx/o     Family History  Problem Relation Age of Onset   Hypertension Mother    Dementia Mother    Colon cancer Mother 32   Deep vein thrombosis Father    Hypertension Father    Cancer Father        prostate CA   Heart disease Father 32       CAD   Parkinsonism Father    Hypertension Sister    Hyperlipidemia Sister    Sjogren's syndrome Sister    Heart attack Sister    Hypertension Brother    Hyperlipidemia Brother    Hypertension Brother    Rheum arthritis Brother    Diabetes Brother    Hypertension Maternal Aunt    Hyperlipidemia Maternal Aunt    Rheum arthritis Maternal Aunt    Diabetes Maternal Aunt        1 with type II, 1 with type 1   Diabetes Paternal Aunt        type II   Breast cancer Neg Hx    Past Surgical History:  Procedure Laterality Date   ABDOMINAL HYSTERECTOMY     CERVICAL FUSION  2010   CERVICAL SPINE SURGERY     CERVIX LESION DESTRUCTION  1988   CESAREAN SECTION     CHOLECYSTECTOMY  2003   COLONOSCOPY  08/2010   Dr. Kristie   COLONOSCOPY  02/2017   Dr. Kristie   ESOPHAGOGASTRODUODENOSCOPY  08/2010   Dr. 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      Has 1 daughter.      Right-handed.   0.5  cup caffeine per day.      05/2024     Immunization History  Administered Date(s) Administered   Influenza,inj,Quad PF,6+ Mos 05/01/2013, 03/31/2014, 05/17/2017, 05/21/2018, 03/29/2019, 04/10/2020, 06/11/2021, 05/17/2022   PFIZER Comirnaty(Gray Top)Covid-19 Tri-Sucrose Vaccine 10/08/2020   PFIZER(Purple Top)SARS-COV-2 Vaccination 08/24/2019, 09/17/2019, 03/30/2020   Pfizer(Comirnaty)Fall Seasonal Vaccine 12 years and older 07/11/2022   Td 02/04/2012   Tdap 06/01/2023   Zoster Recombinant(Shingrix) 09/15/2020, 12/08/2020     Objective: Vital Signs: BP (!) 149/89   Pulse (!) 56   Temp 98.3 F (36.8 C)   Resp 14   Ht 5' 4 (1.626 m)   Wt 132 lb 9.6 oz (60.1 kg)   LMP 10/25/2012 (Approximate)   BMI 22.76 kg/m    Physical Exam Vitals and nursing note reviewed.  Constitutional:      Appearance: She is well-developed.  HENT:     Head: Normocephalic and atraumatic.  Eyes:     Conjunctiva/sclera: Conjunctivae normal.  Cardiovascular:     Rate and Rhythm: Normal rate and regular rhythm.     Heart sounds: Normal heart sounds.  Pulmonary:     Effort: Pulmonary effort is normal.     Breath sounds: Normal breath sounds.  Abdominal:     General: Bowel sounds are normal.     Palpations: Abdomen is soft.  Musculoskeletal:     Cervical back: Normal range of motion.  Lymphadenopathy:     Cervical: No  cervical adenopathy.  Skin:    General: Skin is warm and dry.     Capillary Refill: Capillary refill takes less than 2 seconds.     Comments: Ecchymosis noted on the left upper arm No malar rash  Neurological:     Mental Status: She is alert and oriented to person, place, and time.  Psychiatric:        Behavior: Behavior normal.      Musculoskeletal Exam: C-spine, thoracic spine, lumbar spine have good range of motion.  Trapezius muscle tension and tenderness bilaterally.  No midline spinal tenderness.  No SI joint tenderness.  Shoulder joints, elbow joints, wrist joints, MCPs, PIPs, DIPs have good range of motion with no synovitis.  Complete fist formation bilaterally.  Hip joints have good range of motion with no groin pain.  Knee joints have good range of motion no warmth or effusion.  Ankle joints have good range of motion no tenderness or joint swelling.  No evidence of Achilles tendinitis or plantar fasciitis.   CDAI Exam: CDAI Score: -- Patient Global: --; Provider Global: -- Swollen: --; Tender: -- Joint Exam 05/22/2024   No joint exam has been documented for this visit   There is currently no information documented on the homunculus. Go to the Rheumatology activity and complete the homunculus joint exam.  Investigation: No additional findings.  Imaging: MM 3D SCREENING MAMMOGRAM BILATERAL BREAST Result Date: 05/21/2024 CLINICAL DATA:  Screening. EXAM: DIGITAL SCREENING BILATERAL MAMMOGRAM WITH TOMOSYNTHESIS AND CAD TECHNIQUE: Bilateral screening digital craniocaudal and mediolateral oblique mammograms were obtained. Bilateral screening digital breast tomosynthesis was performed. The images were evaluated with computer-aided detection. COMPARISON:  Previous exam(s). ACR Breast Density Category c: The breasts are heterogeneously dense, which may obscure small masses. FINDINGS: There are no findings suspicious for malignancy. IMPRESSION: No mammographic evidence of malignancy. A  result letter of this screening mammogram will be mailed directly to the patient. RECOMMENDATION: Screening mammogram in one year. (Code:SM-B-01Y) BI-RADS CATEGORY  1: Negative. Electronically Signed   By: Debby Jerilynn HERO.D.  On: 05/21/2024 07:55    Recent Labs: Lab Results  Component Value Date   WBC 4.1 11/07/2023   HGB 12.3 11/07/2023   PLT 188 11/07/2023   NA 141 11/07/2023   K 4.1 11/07/2023   CL 106 11/07/2023   CO2 27 11/07/2023   GLUCOSE 78 11/07/2023   BUN 13 11/07/2023   CREATININE 0.87 11/07/2023   BILITOT 0.4 11/07/2023   ALKPHOS 77 06/01/2023   AST 25 11/07/2023   ALT 22 11/07/2023   PROT 6.9 11/07/2023   ALBUMIN 4.3 06/01/2023   CALCIUM  9.8 11/07/2023   GFRAA 70 11/19/2020    Speciality Comments: PLQ Eye Exam: 01/29/2024 OCT is entirely reassuring. HVF (10/2) is full OU, albiet with high fixation losses.Dr. Octavia will simply trend the field over time. No suggestion of toxicity. Okay to continue PLQ.  Follow up 1 year   Procedures:  No procedures performed Allergies:   Assessment / Plan:     Visit Diagnoses: Autoimmune disease - ANA 1:320 NO, dsDNA8, RF-,CCP-, History of nasal ulcers and oral ulcers in the past, positive synovitis in left hand MCPs on US  previously: Patient has not had any signs or symptoms of a flare.  She has clinically been doing well taking Plaquenil  200 mg 1 tablet by mouth twice daily Monday through Friday.  She is tolerating Plaquenil  without any side effects and has not had any gaps in therapy.  Overall her symptoms have been manageable on the current treatment regimen.  No medication changes will be made at this time.  Lab work from 11/07/23 was reviewed today in the office: dsDNA 5, complements WNL, ESR WNL, and ANA negative. The following lab work will be updated today.  She was advised to notify us  if she develops any new or worsening symptoms.  She will follow-up in the office in 5 months or sooner if needed. Plan: Protein / creatinine  ratio, urine, CBC with Differential/Platelet, C3 and C4, Sedimentation rate, VITAMIN D  25 Hydroxy (Vit-D Deficiency, Fractures), Comprehensive metabolic panel with GFR, Anti-DNA antibody, double-stranded, ANA  Sicca complex: Chronic sicca symptoms-stable.   High risk medication use -Plaquenil  200 mg 1 tablet by mouth daily twice daily Monday through Friday only.  PLQ Eye Exam: 01/29/2024 OCT is entirely reassuring. HVF (10/2) is full OU, albiet with high fixation losses.Dr. Octavia will simply trend the field over time. No suggestion of toxicity. Okay to continue PLQ. Follow up 1 year  CBC and CMP updated on 11/07/23.  Orders for CBC and CMP were released today.  Plan: CBC with Differential/Platelet, Comprehensive metabolic panel with GFR  Lateral epicondylitis, right elbow: Intermittent discomfort.   Primary osteoarthritis of both hands:  No synovitis noted.  Complete fist formation bilaterally.   Primary osteoarthritis of both knees: She has good range of motion of both knee joints on examination today.  No warmth or effusion noted.  Trochanteric bursitis of both hips: Intermittent discomfort.  No groin pain currently.  Myofascial muscle pain: Intermittent myalgias and muscle tenderness.  Not currently experiencing a flare.  Other medical conditions are listed as follows:  Family history of rheumatoid arthritis  Family history of lupus erythematosus  History of hypertension: Blood pressure was slightly elevated today in the office and was rechecked prior to leaving.  Patient was advised to monitor blood pressure closely and return to PCP if blood pressure remains elevated.  History of hyperlipidemia  History of anxiety  Vitamin D  deficiency -plan to check vitamin D  today.  Plan: VITAMIN D  25  Hydroxy (Vit-D Deficiency, Fractures)  History of gastroesophageal reflux (GERD)  Orders: Orders Placed This Encounter  Procedures   Protein / creatinine ratio, urine   CBC with  Differential/Platelet   C3 and C4   Sedimentation rate   VITAMIN D  25 Hydroxy (Vit-D Deficiency, Fractures)   Comprehensive metabolic panel with GFR   Anti-DNA antibody, double-stranded   ANA   No orders of the defined types were placed in this encounter.    Follow-Up Instructions: Return in about 5 months (around 10/20/2024) for Autoimmune Disease.   Waddell CHRISTELLA Craze, PA-C  Note - This record has been created using Dragon software.  Chart creation errors have been sought, but may not always  have been located. Such creation errors do not reflect on  the standard of medical care.

## 2024-05-22 ENCOUNTER — Ambulatory Visit: Attending: Physician Assistant | Admitting: Physician Assistant

## 2024-05-22 ENCOUNTER — Encounter: Payer: Self-pay | Admitting: Physician Assistant

## 2024-05-22 VITALS — BP 135/89 | HR 54 | Temp 98.3°F | Resp 14 | Ht 64.0 in | Wt 132.6 lb

## 2024-05-22 DIAGNOSIS — Z79899 Other long term (current) drug therapy: Secondary | ICD-10-CM

## 2024-05-22 DIAGNOSIS — M19041 Primary osteoarthritis, right hand: Secondary | ICD-10-CM

## 2024-05-22 DIAGNOSIS — M19042 Primary osteoarthritis, left hand: Secondary | ICD-10-CM

## 2024-05-22 DIAGNOSIS — Z8719 Personal history of other diseases of the digestive system: Secondary | ICD-10-CM

## 2024-05-22 DIAGNOSIS — M7711 Lateral epicondylitis, right elbow: Secondary | ICD-10-CM

## 2024-05-22 DIAGNOSIS — E559 Vitamin D deficiency, unspecified: Secondary | ICD-10-CM

## 2024-05-22 DIAGNOSIS — Z8261 Family history of arthritis: Secondary | ICD-10-CM

## 2024-05-22 DIAGNOSIS — Z8659 Personal history of other mental and behavioral disorders: Secondary | ICD-10-CM

## 2024-05-22 DIAGNOSIS — Z8639 Personal history of other endocrine, nutritional and metabolic disease: Secondary | ICD-10-CM

## 2024-05-22 DIAGNOSIS — M7061 Trochanteric bursitis, right hip: Secondary | ICD-10-CM

## 2024-05-22 DIAGNOSIS — M359 Systemic involvement of connective tissue, unspecified: Secondary | ICD-10-CM

## 2024-05-22 DIAGNOSIS — M170 Bilateral primary osteoarthritis of knee: Secondary | ICD-10-CM

## 2024-05-22 DIAGNOSIS — Z840 Family history of diseases of the skin and subcutaneous tissue: Secondary | ICD-10-CM

## 2024-05-22 DIAGNOSIS — M7918 Myalgia, other site: Secondary | ICD-10-CM

## 2024-05-22 DIAGNOSIS — M3500 Sicca syndrome, unspecified: Secondary | ICD-10-CM | POA: Diagnosis not present

## 2024-05-22 DIAGNOSIS — M7062 Trochanteric bursitis, left hip: Secondary | ICD-10-CM

## 2024-05-22 DIAGNOSIS — Z8679 Personal history of other diseases of the circulatory system: Secondary | ICD-10-CM

## 2024-05-25 LAB — ANA: Anti Nuclear Antibody (ANA): POSITIVE — AB

## 2024-05-25 LAB — ANTI-NUCLEAR AB-TITER (ANA TITER)
ANA TITER: 1:40 {titer} — ABNORMAL HIGH
ANA Titer 1: 1:40 {titer} — ABNORMAL HIGH

## 2024-05-25 LAB — VITAMIN D 25 HYDROXY (VIT D DEFICIENCY, FRACTURES): Vit D, 25-Hydroxy: 53 ng/mL (ref 30–100)

## 2024-05-25 LAB — SEDIMENTATION RATE: Sed Rate: 9 mm/h (ref 0–30)

## 2024-05-25 LAB — PROTEIN / CREATININE RATIO, URINE
Creatinine, Urine: 147 mg/dL (ref 20–275)
Protein/Creat Ratio: 82 mg/g{creat} (ref 24–184)
Protein/Creatinine Ratio: 0.082 mg/mg{creat} (ref 0.024–0.184)
Total Protein, Urine: 12 mg/dL (ref 5–24)

## 2024-05-25 LAB — COMPREHENSIVE METABOLIC PANEL WITH GFR
AG Ratio: 2 (calc) (ref 1.0–2.5)
ALT: 17 U/L (ref 6–29)
AST: 23 U/L (ref 10–35)
Albumin: 4.4 g/dL (ref 3.6–5.1)
Alkaline phosphatase (APISO): 70 U/L (ref 37–153)
BUN: 14 mg/dL (ref 7–25)
CO2: 25 mmol/L (ref 20–32)
Calcium: 9.4 mg/dL (ref 8.6–10.4)
Chloride: 105 mmol/L (ref 98–110)
Creat: 0.83 mg/dL (ref 0.50–1.05)
Globulin: 2.2 g/dL (ref 1.9–3.7)
Glucose, Bld: 81 mg/dL (ref 65–99)
Potassium: 4.3 mmol/L (ref 3.5–5.3)
Sodium: 139 mmol/L (ref 135–146)
Total Bilirubin: 0.4 mg/dL (ref 0.2–1.2)
Total Protein: 6.6 g/dL (ref 6.1–8.1)
eGFR: 80 mL/min/1.73m2 (ref >=60)

## 2024-05-25 LAB — CBC WITH DIFFERENTIAL/PLATELET
Absolute Lymphocytes: 1831 {cells}/uL (ref 850–3900)
Absolute Monocytes: 347 {cells}/uL (ref 200–950)
Basophils Absolute: 31 {cells}/uL (ref 0–200)
Basophils Relative: 0.6 %
Eosinophils Absolute: 117 {cells}/uL (ref 15–500)
Eosinophils Relative: 2.3 %
HCT: 38.3 % (ref 35.9–46.0)
Hemoglobin: 12.3 g/dL (ref 11.7–15.5)
MCH: 29.5 pg (ref 27.0–33.0)
MCHC: 32.1 g/dL (ref 31.6–35.4)
MCV: 91.8 fL (ref 81.4–101.7)
MPV: 12.9 fL — ABNORMAL HIGH (ref 7.5–12.5)
Monocytes Relative: 6.8 %
Neutro Abs: 2774 {cells}/uL (ref 1500–7800)
Neutrophils Relative %: 54.4 %
Platelets: 132 Thousand/uL — ABNORMAL LOW (ref 140–400)
RBC: 4.17 Million/uL (ref 3.80–5.10)
RDW: 13.4 % (ref 11.0–15.0)
Total Lymphocyte: 35.9 %
WBC: 5.1 Thousand/uL (ref 3.8–10.8)

## 2024-05-25 LAB — ANTI-DNA ANTIBODY, DOUBLE-STRANDED: ds DNA Ab: 6 [IU]/mL — ABNORMAL HIGH

## 2024-05-25 LAB — C3 AND C4
C3 Complement: 135 mg/dL (ref 83–193)
C4 Complement: 21 mg/dL (ref 15–57)

## 2024-05-26 ENCOUNTER — Ambulatory Visit: Payer: Self-pay | Admitting: Physician Assistant

## 2024-05-26 DIAGNOSIS — Z79899 Other long term (current) drug therapy: Secondary | ICD-10-CM

## 2024-05-26 NOTE — Progress Notes (Signed)
 CMP WNL Platelet count is borderline low. Rest of CBC stable. Recommend rechecking [facility] in 1 month.  Vitamin D  WNL  ANA remains positive.  dsDNA is within the indeterminate range.  Urine protein creatinine ratio WNL Complements WNL ESR WNL

## 2024-05-29 ENCOUNTER — Other Ambulatory Visit: Payer: Self-pay

## 2024-05-29 ENCOUNTER — Ambulatory Visit: Payer: Self-pay

## 2024-05-29 ENCOUNTER — Emergency Department (HOSPITAL_COMMUNITY)

## 2024-05-29 ENCOUNTER — Emergency Department (HOSPITAL_COMMUNITY)
Admission: EM | Admit: 2024-05-29 | Discharge: 2024-05-30 | Disposition: A | Attending: Emergency Medicine | Admitting: Emergency Medicine

## 2024-05-29 ENCOUNTER — Encounter (HOSPITAL_COMMUNITY): Payer: Self-pay | Admitting: Emergency Medicine

## 2024-05-29 ENCOUNTER — Ambulatory Visit: Admitting: Medical

## 2024-05-29 DIAGNOSIS — D649 Anemia, unspecified: Secondary | ICD-10-CM | POA: Insufficient documentation

## 2024-05-29 DIAGNOSIS — Z79899 Other long term (current) drug therapy: Secondary | ICD-10-CM | POA: Insufficient documentation

## 2024-05-29 DIAGNOSIS — R072 Precordial pain: Secondary | ICD-10-CM | POA: Diagnosis present

## 2024-05-29 DIAGNOSIS — M25512 Pain in left shoulder: Secondary | ICD-10-CM | POA: Insufficient documentation

## 2024-05-29 DIAGNOSIS — M25519 Pain in unspecified shoulder: Secondary | ICD-10-CM

## 2024-05-29 DIAGNOSIS — Z91040 Latex allergy status: Secondary | ICD-10-CM | POA: Insufficient documentation

## 2024-05-29 DIAGNOSIS — R0789 Other chest pain: Secondary | ICD-10-CM

## 2024-05-29 LAB — COMPREHENSIVE METABOLIC PANEL WITH GFR
ALT: 23 U/L (ref 0–44)
AST: 29 U/L (ref 15–41)
Albumin: 3.6 g/dL (ref 3.5–5.0)
Alkaline Phosphatase: 58 U/L (ref 38–126)
Anion gap: 8 (ref 5–15)
BUN: 11 mg/dL (ref 8–23)
CO2: 24 mmol/L (ref 22–32)
Calcium: 9.2 mg/dL (ref 8.9–10.3)
Chloride: 106 mmol/L (ref 98–111)
Creatinine, Ser: 0.8 mg/dL (ref 0.44–1.00)
GFR, Estimated: >60 mL/min (ref >60)
Glucose, Bld: 91 mg/dL (ref 70–99)
Potassium: 3.5 mmol/L (ref 3.5–5.1)
Sodium: 138 mmol/L (ref 135–145)
Total Bilirubin: 0.9 mg/dL (ref 0.0–1.2)
Total Protein: 6.2 g/dL — ABNORMAL LOW (ref 6.5–8.1)

## 2024-05-29 LAB — CBC
HCT: 35.1 % — ABNORMAL LOW (ref 36.0–46.0)
Hemoglobin: 11.6 g/dL — ABNORMAL LOW (ref 12.0–15.0)
MCH: 29.9 pg (ref 26.0–34.0)
MCHC: 33 g/dL (ref 30.0–36.0)
MCV: 90.5 fL (ref 80.0–100.0)
Platelets: 166 K/uL (ref 150–400)
RBC: 3.88 MIL/uL (ref 3.87–5.11)
RDW: 14.5 % (ref 11.5–15.5)
WBC: 5.2 K/uL (ref 4.0–10.5)
nRBC: 0 % (ref 0.0–0.2)

## 2024-05-29 LAB — LIPASE, BLOOD: Lipase: 24 U/L (ref 11–51)

## 2024-05-29 LAB — TROPONIN I (HIGH SENSITIVITY): Troponin I (High Sensitivity): 6 ng/L (ref <18)

## 2024-05-29 NOTE — Telephone Encounter (Signed)
 FYI Only or Action Required?: FYI only for provider: ED advised.  Patient was last seen in primary care on 04/23/2024 by Bulah Alm RAMAN, PA-C.  Called Nurse Triage reporting Shoulder Pain.  Symptoms began several days ago.  Interventions attempted: OTC medications: Tylenol , Ibuprofen .  Symptoms are: unchanged.  Triage Disposition: Go to ED Now (Notify PCP)  Patient/caregiver understands and will follow disposition?: Yes Reason for Disposition  [1] Pain lasting > 5 minutes AND [2] pain also present in chest  (Exception: Pain is clearly made worse by movement.)  Answer Assessment - Initial Assessment Questions During initial triage call patient was advised to see PCP and appt was scheduled for tomorrow, 05/30/24. Upon further review of triage findings RN felt that patient should go to ED today. Disposition updated.   1. ONSET: When did the pain start?     Sunday/Monday   2. LOCATION: Where is the pain located?     Pt states it started with a shocking pain in the back of her neck and she now feels tight across the shoulders; she states she feels the pain radiate to her back, up to her head, and inside ear, Pt also states she feels the tightness over her chest; reports baseline pain in bilateral arms   3. PAIN: How bad is the pain? (Scale 1-10; or mild, moderate, severe)     4/10 at rest; 7/10 to touch/turning  4. WORK OR EXERCISE: Has there been any recent work or exercise that involved this part of the body?     Not that I can think of  5. CAUSE: What do you think is causing the shoulder pain?     I'm not sure, maybe nerves or I'm just really tense. Pt also mentions her BP was high last week at the doctor's office  6. OTHER SYMPTOMS: Do you have any other symptoms? (e.g., neck pain, swelling, rash, fever, numbness, weakness)     Pt reports pain in neck, back, chest, head; Denies fever, numbness/tingling, weakness, SOB  7. PREGNANCY: Is there any chance you  are pregnant? When was your last menstrual period?     NA  Protocols used: Shoulder Pain-A-AH  Copied from CRM #[id number]. Topic: Clinical - Red Word Triage >> May 29, 2024  9:29 AM Willma SAUNDERS wrote: Red Word that prompted transfer to Nurse Triage: Patient states since Saturday has had extreme discomfort/pain in her head, neck, back and clavicle area, going up into her head. Having difficulty sitting up. Had similar issues in the past but this has been different because the pain has come on quickly. Also BP elevated recently, last night was 167/89, but hasn't taken it today.

## 2024-05-29 NOTE — Telephone Encounter (Signed)
 This RN made first attempt to contact pt to let her know to go to ED based off below symptoms. Pt did not answer. A voicemail was left with call back number provided.

## 2024-05-29 NOTE — Telephone Encounter (Signed)
Pt is coming in today for a visit.

## 2024-05-29 NOTE — ED Provider Triage Note (Signed)
 Emergency Medicine Provider Triage Evaluation Note  Shari Lawrence , a 63 y.o. female  was evaluated in triage.  Pt complains of central chest pain on going since last night. Reports pain is constant. No pain with exertion. She does report some pain in her back that has been present for the past 3 days. No shortness of breath. No dizziness or feelings of pre-syncope. She also reports pain that shoots up through the right side of her neck. Reports this neck pain is consistent with previous headaches she has been treated for.   Review of Systems  Positive: As above Negative: As above  Physical Exam  BP (!) 160/84 (BP Location: Left Arm)   Pulse 66   Temp 98.5 F (36.9 C)   Resp 18   Ht 5' 4 (1.626 m)   Wt 60.1 kg   LMP 10/25/2012 (Approximate)   SpO2 100%   BMI 22.74 kg/m  Gen:   Awake, no distress   Resp:  Normal effort  MSK:   Moves extremities without difficulty    Medical Decision Making  Medically screening exam initiated at 9:12 PM.  Appropriate orders placed.  Shari Lawrence was informed that the remainder of the evaluation will be completed by another provider, this initial triage assessment does not replace that evaluation, and the importance of remaining in the ED until their evaluation is complete.     Shari Palma, PA-C 05/29/24 [id number]

## 2024-05-29 NOTE — Telephone Encounter (Signed)
 This RN contacted patient and reviewed rationale for ED recommendation. Pt verbalized understanding and said she will consider it. Attempted to review rationales again and pt stated I know.   Requested patient call back to confirm if she went or did not go. Message to be routed HP to clinic staff.

## 2024-05-29 NOTE — ED Triage Notes (Signed)
 Pt bib ems from home with c/o chest pain that started tonight. Pt c/o generalized chest pain. Also endorses left shoulder pain that started earlier the week. Denies cardiac hx.   324 asa pta  154/76  22 lac

## 2024-05-30 ENCOUNTER — Ambulatory Visit: Admitting: Family Medicine

## 2024-05-30 LAB — TROPONIN I (HIGH SENSITIVITY): Troponin I (High Sensitivity): 7 ng/L (ref <18)

## 2024-05-30 MED ORDER — KETOROLAC TROMETHAMINE 30 MG/ML IJ SOLN
15.0000 mg | Freq: Once | INTRAMUSCULAR | Status: AC
  Administered 2024-05-30: 15 mg via INTRAVENOUS
  Filled 2024-05-30: qty 1

## 2024-05-30 NOTE — ED Provider Notes (Signed)
 [facility] AT [facility] [place] Provider Note   CSN: [id number] Arrival date & time: 05/29/24  2027     Patient presents with: Chest Pain   Shari Lawrence is a 63 y.o. female.   The history is provided by the patient.  Chest Pain Pain location:  Substernal area (started in left shoulder then moved to the right shoulder now soreness in the chest  right) Pain quality: aching   Pain quality comment:  Sore Pain severity:  Moderate Onset quality:  Gradual Duration: days. Progression:  Unchanged Chronicity:  New Context: lifting   Context comment:  Repeated lifting of her nephew who weights 40 lbs Relieved by:  Nothing Worsened by:  Nothing Ineffective treatments:  None tried Associated symptoms: no AICD problem, no altered mental status, no anorexia, no anxiety, no back pain, no fever, no orthopnea, no palpitations, no shortness of breath, no syncope and no vomiting   Risk factors: no aortic disease and not obese        Prior to Admission medications   Medication Sig Start Date End Date Taking? Authorizing Provider  acetaminophen  (TYLENOL ) 500 MG tablet Take 1 tablet (500 mg total) by mouth every 6 (six) hours as needed. 04/01/17   Dansie, William, PA-C  ALPRAZolam  (XANAX ) 0.5 MG tablet Take 0.5 mg by mouth as needed for anxiety.    [provider]  amLODipine  (NORVASC ) 5 MG tablet TAKE 1 TABLET (5 MG TOTAL) BY MOUTH DAILY. 05/17/24   Tysinger, Alm RAMAN, PA-C  Ascorbic Acid (VITAMIN C) 1000 MG tablet Take 1,000 mg by mouth daily.    [provider]  atorvastatin  (LIPITOR) 20 MG tablet Take 1 tablet (20 mg total) by mouth daily. 02/05/24   Tysinger, Alm RAMAN, PA-C  B Complex Vitamins (B COMPLEX PO) Take 1 tablet by mouth daily.    [provider]  calcium  carbonate (OS-CAL) 600 MG TABS Take 600 mg by mouth daily.    [provider]  CINNAMON PO Take 1 tablet by mouth daily.     [provider]   escitalopram  (LEXAPRO ) 10 MG tablet Take 5 mg by mouth daily.    [provider]  esomeprazole  (NEXIUM ) 40 MG capsule Take 40 mg by mouth every morning. 12/05/22   [provider]  estradiol  (VIVELLE -DOT) 0.05 MG/24HR patch Place 1 patch (0.05 mg total) onto the skin 2 (two) times a week. 09/28/23   Chrzanowski, Jami B, NP  Estradiol  (YUVAFEM ) 10 MCG TABS vaginal tablet PLACE ONE TABLET VAGINALLY TWICE WEEKLY AT BEDTIME 09/26/23   Chrzanowski, Jami B, NP  fish oil-omega-3 fatty acids 1000 MG capsule Take 1 g by mouth daily.    [provider]  fluticasone  (FLONASE ) 50 MCG/ACT nasal spray Place 1 spray into both nostrils daily. 04/09/24   Tysinger, Alm RAMAN, PA-C  hydroxychloroquine  (PLAQUENIL ) 200 MG tablet TAKE 1 TABLET BY MOUTH TWICE A DAY ON MONDAYS THROUGH FRIDAYS ONLY 05/21/24   Cheryl Waddell HERO, PA-C  magic mouthwash SOLN Take 5 mLs by mouth as needed. 04/23/24   Tysinger, Alm RAMAN, PA-C  meloxicam  (MOBIC ) 7.5 MG tablet TAKE 1 TABLET BY MOUTH DAILY AS NEEDED FOR PAIN 10/23/23   Onita Duos, MD  Multiple Vitamin (MULTIVITAMIN) tablet Take 1 tablet by mouth daily.    [provider]  Probiotic Product (PROBIOTIC ADVANCED PO) Take 1 tablet by mouth daily.     [provider]  TURMERIC PO Take 1 tablet by mouth daily.  [provider]    Allergies: Other, Quinolones, Codone [hydrocodone], Garlic, Latex, Levaquin [levofloxacin], Losartan , Metoprolol , Onion, Penicillins, Percocet [oxycodone-acetaminophen ], Sulfa drugs cross reactors, Vicodin [hydrocodone-acetaminophen ], and Gluten meal    Review of Systems  Constitutional:  Negative for fever.  HENT:  Negative for ear discharge, ear pain and facial swelling.   Respiratory:  Negative for shortness of breath.   Cardiovascular:  Positive for chest pain. Negative for palpitations, orthopnea and syncope.  Gastrointestinal:  Negative for anorexia and vomiting.  Musculoskeletal:  Positive for  arthralgias. Negative for back pain.  All other systems reviewed and are negative.   Updated Vital Signs BP (!) 154/88 (BP Location: Right Arm)   Pulse (!) 58   Temp 98.1 F (36.7 C)   Resp 16   Ht 5' 4 (1.626 m)   Wt 60.1 kg   LMP 10/25/2012 (Approximate)   SpO2 100%   BMI 22.74 kg/m   Physical Exam Vitals and nursing note reviewed.  Constitutional:      General: She is not in acute distress.    Appearance: Normal appearance. She is well-developed.  HENT:     Head: Normocephalic and atraumatic.     Nose: Nose normal.  Eyes:     Pupils: Pupils are equal, round, and reactive to light.  Cardiovascular:     Rate and Rhythm: Normal rate and regular rhythm.     Pulses: Normal pulses.     Heart sounds: Normal heart sounds.  Pulmonary:     Effort: Pulmonary effort is normal. No respiratory distress.     Breath sounds: Normal breath sounds.  Abdominal:     General: Bowel sounds are normal. There is no distension.     Palpations: Abdomen is soft.     Tenderness: There is no abdominal tenderness. There is no guarding or rebound.  Musculoskeletal:        General: Normal range of motion.     Cervical back: Normal range of motion and neck supple.  Skin:    General: Skin is warm and dry.     Capillary Refill: Capillary refill takes less than 2 seconds.     Findings: No erythema or rash.  Neurological:     General: No focal deficit present.     Mental Status: She is alert.     Deep Tendon Reflexes: Reflexes normal.  Psychiatric:        Mood and Affect: Mood normal.     (all labs ordered are listed, but only abnormal results are displayed) Results for orders placed or performed during the hospital encounter of 05/29/24  CBC   Collection Time: 05/29/24  9:25 PM  Result Value Ref Range   WBC 5.2 4.0 - 10.5 K/uL   RBC 3.88 3.87 - 5.11 MIL/uL   Hemoglobin 11.6 (L) 12.0 - 15.0 g/dL   HCT 64.8 (L) 63.9 - 53.9 %   MCV 90.5 80.0 - 100.0 fL   MCH 29.9 26.0 - 34.0 pg   MCHC  33.0 30.0 - 36.0 g/dL   RDW 85.4 88.4 - 84.4 %   Platelets 166 150 - 400 K/uL   nRBC 0.0 0.0 - 0.2 %  Comprehensive metabolic panel   Collection Time: 05/29/24  9:25 PM  Result Value Ref Range   Sodium 138 135 - 145 mmol/L   Potassium 3.5 3.5 - 5.1 mmol/L   Chloride 106 98 - 111 mmol/L   CO2 24 22 - 32 mmol/L   Glucose, Bld 91 70 - 99 mg/dL  BUN 11 8 - 23 mg/dL   Creatinine, Ser 9.19 0.44 - 1.00 mg/dL   Calcium  9.2 8.9 - 10.3 mg/dL   Total Protein 6.2 (L) 6.5 - 8.1 g/dL   Albumin 3.6 3.5 - 5.0 g/dL   AST 29 15 - 41 U/L   ALT 23 0 - 44 U/L   Alkaline Phosphatase 58 38 - 126 U/L   Total Bilirubin 0.9 0.0 - 1.2 mg/dL   GFR, Estimated >39 >39 mL/min   Anion gap 8 5 - 15  Lipase, blood   Collection Time: 05/29/24  9:25 PM  Result Value Ref Range   Lipase 24 11 - 51 U/L  Troponin I (High Sensitivity)   Collection Time: 05/29/24  9:25 PM  Result Value Ref Range   Troponin I (High Sensitivity) 6 <18 ng/L  Troponin I (High Sensitivity)   Collection Time: 05/29/24 10:59 PM  Result Value Ref Range   Troponin I (High Sensitivity) 7 <18 ng/L   DG Chest 2 View Result Date: 05/29/2024 CLINICAL DATA:  Chest pain EXAM: CHEST - 2 VIEW COMPARISON:  Chest x-ray 08/06/2022 FINDINGS: Cardiomediastinal silhouette is within normal limits. There is no new focal lung infiltrate, pleural effusion or pneumothorax. No acute fractures are seen. IMPRESSION: No active cardiopulmonary disease. Electronically Signed   By: Greig Pique M.D.   On: 05/29/2024 22:21   MM 3D SCREENING MAMMOGRAM BILATERAL BREAST Result Date: 05/21/2024 CLINICAL DATA:  Screening. EXAM: DIGITAL SCREENING BILATERAL MAMMOGRAM WITH TOMOSYNTHESIS AND CAD TECHNIQUE: Bilateral screening digital craniocaudal and mediolateral oblique mammograms were obtained. Bilateral screening digital breast tomosynthesis was performed. The images were evaluated with computer-aided detection. COMPARISON:  Previous exam(s). ACR Breast Density Category c:  The breasts are heterogeneously dense, which may obscure small masses. FINDINGS: There are no findings suspicious for malignancy. IMPRESSION: No mammographic evidence of malignancy. A result letter of this screening mammogram will be mailed directly to the patient. RECOMMENDATION: Screening mammogram in one year. (Code:SM-B-01Y) BI-RADS CATEGORY  1: Negative. Electronically Signed   By: Debby Satterfield M.D.   On: 05/21/2024 07:55    EKG: EKG Interpretation Date/Time:  Wednesday May 29 2024 20:32:52 EST Ventricular Rate:  62 PR Interval:  174 QRS Duration:  94 QT Interval:  414 QTC Calculation: 420 R Axis:   -3  Text Interpretation: Normal sinus rhythm Possible Left atrial enlargement Confirmed by Nettie, Huxley Shurley ([id number]) on 05/30/2024 2:18:50 AM  Radiology: [facility] Chest 2 View Result Date: 05/29/2024 CLINICAL DATA:  Chest pain EXAM: CHEST - 2 VIEW COMPARISON:  Chest x-ray 08/06/2022 FINDINGS: Cardiomediastinal silhouette is within normal limits. There is no new focal lung infiltrate, pleural effusion or pneumothorax. No acute fractures are seen. IMPRESSION: No active cardiopulmonary disease. Electronically Signed   By: Greig Pique M.D.   On: 05/29/2024 22:21     Procedures   Medications Ordered in the ED  ketorolac  (TORADOL ) 30 MG/ML injection 15 mg (15 mg Intravenous Given 05/30/24 0242)                                    Medical Decision Making Patient with shoulder pain now chest pain post heavy lifting.    Amount and/or Complexity of Data Reviewed External Data Reviewed: notes.    Details: Previous notes reviewed  Labs: ordered.    Details: Normal white count 5.2, slight low hemoglobin 11.6, normal platelets. Normal sodium 138, normal potassium 3.5, normal creatinine normal troponin  6/7.  Radiology: ordered and independent interpretation performed.    Details: Normal CXR ECG/medicine tests: ordered and independent interpretation performed. Decision-making details  documented in ED Course.  Risk Prescription drug management. Risk Details: Symptoms are consistent with MSK pain.  Ruled out for Mi in the ED. HEART score is 2 low risk for mace.  No heavy lifting, alternate tylenol  and ibuprofen , heat therapy.  Follow up with your PMD for ongoing care.  Strict returns     Final diagnoses:  Chest wall pain  Acute shoulder pain, unspecified laterality   No signs of systemic illness or infection. The patient is nontoxic-appearing on exam and vital signs are within normal limits.  I have reviewed the triage vital signs and the nursing notes. Pertinent labs & imaging results that were available during my care of the patient were reviewed by me and considered in my medical decision making (see chart for details). After history, exam, and medical workup I feel the patient has been appropriately medically screened and is safe for discharge home. Pertinent diagnoses were discussed with the patient. Patient was given return precautions.    ED Discharge Orders     None          Lamya Lausch, MD 05/30/24 [phone]

## 2024-06-03 ENCOUNTER — Encounter: Payer: Self-pay | Admitting: Internal Medicine

## 2024-06-04 ENCOUNTER — Encounter: Payer: BC Managed Care – PPO | Admitting: Medical

## 2024-06-18 ENCOUNTER — Other Ambulatory Visit: Payer: Self-pay | Admitting: Physician Assistant

## 2024-06-18 DIAGNOSIS — M359 Systemic involvement of connective tissue, unspecified: Secondary | ICD-10-CM

## 2024-06-18 NOTE — Telephone Encounter (Signed)
 Last Fill: 05/21/2024  Eye exam: 01/29/2024 OCT is entirely reassuring. HVF (10/2) is full OU, albiet with high fixation losses.Dr. Octavia will simply trend the field over time. No suggestion of toxicity. Okay to continue PLQ.  Labs: 05/29/2024 Hemoglobin 11.6 HCT 35.1 Total Protein 6.2  Next Visit: 10/22/2024  Last Visit: 05/22/2024  IK:Jlunpfflwz disease   Current Dose per office note 05/22/2024: Plaquenil  200 mg 1 tablet by mouth twice daily Monday through Friday.   Okay to refill Plaquenil ?

## 2024-07-11 ENCOUNTER — Other Ambulatory Visit: Payer: Self-pay

## 2024-07-11 DIAGNOSIS — Z79899 Other long term (current) drug therapy: Secondary | ICD-10-CM

## 2024-07-11 LAB — CBC WITH DIFFERENTIAL/PLATELET
Absolute Lymphocytes: 1982 {cells}/uL (ref 850–3900)
Absolute Monocytes: 481 {cells}/uL (ref 200–950)
Basophils Absolute: 38 {cells}/uL (ref 0–200)
Basophils Relative: 0.7 %
Eosinophils Absolute: 162 {cells}/uL (ref 15–500)
Eosinophils Relative: 3 %
HCT: 36.4 % (ref 35.9–46.0)
Hemoglobin: 11.7 g/dL (ref 11.7–15.5)
MCH: 29.3 pg (ref 27.0–33.0)
MCHC: 32.1 g/dL (ref 31.6–35.4)
MCV: 91.2 fL (ref 81.4–101.7)
MPV: 12.9 fL — ABNORMAL HIGH (ref 7.5–12.5)
Monocytes Relative: 8.9 %
Neutro Abs: 2738 {cells}/uL (ref 1500–7800)
Neutrophils Relative %: 50.7 %
Platelets: 175 Thousand/uL (ref 140–400)
RBC: 3.99 Million/uL (ref 3.80–5.10)
RDW: 13.2 % (ref 11.0–15.0)
Total Lymphocyte: 36.7 %
WBC: 5.4 Thousand/uL (ref 3.8–10.8)

## 2024-07-12 ENCOUNTER — Ambulatory Visit: Payer: Self-pay | Admitting: Rheumatology

## 2024-07-12 NOTE — Progress Notes (Signed)
 CBC normal

## 2024-09-04 ENCOUNTER — Encounter: Admitting: Medical

## 2024-10-01 ENCOUNTER — Ambulatory Visit: Admitting: Radiology

## 2024-10-08 ENCOUNTER — Ambulatory Visit: Admitting: Radiology

## 2024-10-22 ENCOUNTER — Ambulatory Visit: Admitting: Physician Assistant
